# Patient Record
Sex: Male | Born: 1945 | Race: White | Hispanic: No | Marital: Married | Smoking: Former smoker
Health system: Southern US, Community
[De-identification: ages and names within clinical notes are randomized; demographics above are authoritative.]

## PROBLEM LIST (undated history)

## (undated) DIAGNOSIS — Z9889 Other specified postprocedural states: Secondary | ICD-10-CM

## (undated) DIAGNOSIS — M1990 Unspecified osteoarthritis, unspecified site: Secondary | ICD-10-CM

## (undated) DIAGNOSIS — R0600 Dyspnea, unspecified: Secondary | ICD-10-CM

## (undated) DIAGNOSIS — I10 Essential (primary) hypertension: Secondary | ICD-10-CM

## (undated) DIAGNOSIS — E1165 Type 2 diabetes mellitus with hyperglycemia: Secondary | ICD-10-CM

## (undated) DIAGNOSIS — T8859XA Other complications of anesthesia, initial encounter: Secondary | ICD-10-CM

## (undated) DIAGNOSIS — N183 Chronic kidney disease, stage 3 unspecified: Secondary | ICD-10-CM

## (undated) DIAGNOSIS — G43909 Migraine, unspecified, not intractable, without status migrainosus: Secondary | ICD-10-CM

## (undated) DIAGNOSIS — Z951 Presence of aortocoronary bypass graft: Secondary | ICD-10-CM

## (undated) DIAGNOSIS — I509 Heart failure, unspecified: Secondary | ICD-10-CM

## (undated) DIAGNOSIS — I252 Old myocardial infarction: Secondary | ICD-10-CM

## (undated) DIAGNOSIS — IMO0001 Reserved for inherently not codable concepts without codable children: Secondary | ICD-10-CM

## (undated) DIAGNOSIS — Z973 Presence of spectacles and contact lenses: Secondary | ICD-10-CM

## (undated) DIAGNOSIS — E7800 Pure hypercholesterolemia, unspecified: Secondary | ICD-10-CM

## (undated) DIAGNOSIS — I739 Peripheral vascular disease, unspecified: Secondary | ICD-10-CM

## (undated) DIAGNOSIS — D649 Anemia, unspecified: Secondary | ICD-10-CM

## (undated) DIAGNOSIS — J45909 Unspecified asthma, uncomplicated: Secondary | ICD-10-CM

## (undated) DIAGNOSIS — R011 Cardiac murmur, unspecified: Secondary | ICD-10-CM

## (undated) DIAGNOSIS — Z9861 Coronary angioplasty status: Secondary | ICD-10-CM

## (undated) DIAGNOSIS — I2510 Atherosclerotic heart disease of native coronary artery without angina pectoris: Secondary | ICD-10-CM

## (undated) DIAGNOSIS — R112 Nausea with vomiting, unspecified: Secondary | ICD-10-CM

## (undated) DIAGNOSIS — K219 Gastro-esophageal reflux disease without esophagitis: Secondary | ICD-10-CM

## (undated) DIAGNOSIS — T4145XA Adverse effect of unspecified anesthetic, initial encounter: Secondary | ICD-10-CM

## (undated) DIAGNOSIS — E1139 Type 2 diabetes mellitus with other diabetic ophthalmic complication: Secondary | ICD-10-CM

## (undated) DIAGNOSIS — I25810 Atherosclerosis of coronary artery bypass graft(s) without angina pectoris: Secondary | ICD-10-CM

## (undated) HISTORY — PX: TONSILLECTOMY: SUR1361

## (undated) HISTORY — DX: Atherosclerosis of coronary artery bypass graft(s) without angina pectoris: I25.810

## (undated) HISTORY — PX: COLONOSCOPY: SHX174

## (undated) HISTORY — DX: Peripheral vascular disease, unspecified: I73.9

## (undated) HISTORY — PX: EYE SURGERY: SHX253

## (undated) HISTORY — DX: Atherosclerotic heart disease of native coronary artery without angina pectoris: I25.10

## (undated) HISTORY — DX: Type 2 diabetes mellitus with other diabetic ophthalmic complication: E11.39

## (undated) HISTORY — DX: Reserved for inherently not codable concepts without codable children: IMO0001

## (undated) HISTORY — DX: Coronary angioplasty status: Z98.61

## (undated) HISTORY — DX: Type 2 diabetes mellitus with hyperglycemia: E11.65

## (undated) HISTORY — DX: Old myocardial infarction: I25.2

## (undated) HISTORY — PX: RETINAL LASER PROCEDURE: SHX2339

---

## 1999-01-11 ENCOUNTER — Ambulatory Visit (HOSPITAL_COMMUNITY): Admission: RE | Admit: 1999-01-11 | Discharge: 1999-01-11 | Payer: Self-pay | Admitting: Gastroenterology

## 2000-12-22 ENCOUNTER — Encounter: Payer: Self-pay | Admitting: Orthopedic Surgery

## 2000-12-22 ENCOUNTER — Ambulatory Visit (HOSPITAL_COMMUNITY): Admission: RE | Admit: 2000-12-22 | Discharge: 2000-12-22 | Payer: Self-pay | Admitting: Orthopedic Surgery

## 2001-06-14 ENCOUNTER — Ambulatory Visit (HOSPITAL_COMMUNITY): Admission: RE | Admit: 2001-06-14 | Discharge: 2001-06-14 | Payer: Self-pay | Admitting: Cardiology

## 2001-06-14 ENCOUNTER — Encounter: Payer: Self-pay | Admitting: Cardiology

## 2001-07-01 ENCOUNTER — Encounter: Payer: Self-pay | Admitting: Cardiology

## 2001-07-01 ENCOUNTER — Encounter: Admission: RE | Admit: 2001-07-01 | Discharge: 2001-07-01 | Payer: Self-pay | Admitting: Cardiology

## 2001-07-06 ENCOUNTER — Ambulatory Visit (HOSPITAL_COMMUNITY): Admission: RE | Admit: 2001-07-06 | Discharge: 2001-07-06 | Payer: Self-pay | Admitting: Cardiology

## 2002-04-11 ENCOUNTER — Encounter: Admission: RE | Admit: 2002-04-11 | Discharge: 2002-04-11 | Payer: Self-pay | Admitting: Orthopedic Surgery

## 2002-04-11 ENCOUNTER — Encounter: Payer: Self-pay | Admitting: Orthopedic Surgery

## 2002-07-14 ENCOUNTER — Encounter: Payer: Self-pay | Admitting: Family Medicine

## 2002-07-14 ENCOUNTER — Encounter: Admission: RE | Admit: 2002-07-14 | Discharge: 2002-07-14 | Payer: Self-pay | Admitting: Family Medicine

## 2002-12-27 ENCOUNTER — Emergency Department (HOSPITAL_COMMUNITY): Admission: EM | Admit: 2002-12-27 | Discharge: 2002-12-27 | Payer: Self-pay | Admitting: Emergency Medicine

## 2002-12-27 ENCOUNTER — Encounter: Payer: Self-pay | Admitting: Family Medicine

## 2002-12-28 ENCOUNTER — Inpatient Hospital Stay (HOSPITAL_COMMUNITY): Admission: RE | Admit: 2002-12-28 | Discharge: 2002-12-30 | Payer: Self-pay | Admitting: Family Medicine

## 2002-12-29 ENCOUNTER — Encounter: Payer: Self-pay | Admitting: Family Medicine

## 2011-02-22 HISTORY — PX: CORONARY ANGIOPLASTY WITH STENT PLACEMENT: SHX49

## 2011-05-13 ENCOUNTER — Other Ambulatory Visit (HOSPITAL_COMMUNITY): Payer: Self-pay | Admitting: Cardiology

## 2011-05-17 MED ORDER — SODIUM CHLORIDE 0.9 % IJ SOLN: 3.0000 mL | Freq: Two times a day (BID) | INTRAMUSCULAR | Status: DC

## 2011-05-17 MED ORDER — SODIUM CHLORIDE 0.9 % IJ SOLN: 3.0000 mL | INTRAMUSCULAR | Status: DC | PRN

## 2011-05-17 MED ORDER — DIAZEPAM 5 MG PO TABS: 5.0000 mg | ORAL_TABLET | ORAL | Status: DC

## 2011-05-17 MED ORDER — SODIUM CHLORIDE 0.9 % IV SOLN: INTRAVENOUS | Status: DC

## 2011-05-17 MED ORDER — SODIUM CHLORIDE 0.9 % IV SOLN: 250.0000 mL | INTRAVENOUS | Status: DC

## 2011-05-20 ENCOUNTER — Other Ambulatory Visit: Payer: Self-pay | Admitting: Cardiology

## 2011-05-27 ENCOUNTER — Encounter (HOSPITAL_COMMUNITY): Payer: Self-pay | Admitting: Pharmacy Technician

## 2011-06-06 ENCOUNTER — Other Ambulatory Visit: Payer: Self-pay | Admitting: Cardiology

## 2011-06-06 ENCOUNTER — Ambulatory Visit
Admission: RE | Admit: 2011-06-06 | Discharge: 2011-06-06 | Disposition: A | Discharge status: Home / Self Care | Payer: Medicare Other | Source: Ambulatory Visit | Attending: Cardiology | Admitting: Cardiology

## 2011-06-06 DIAGNOSIS — R0602 Shortness of breath: Secondary | ICD-10-CM

## 2011-06-11 ENCOUNTER — Encounter (HOSPITAL_COMMUNITY)
Admission: RE | Disposition: A | Discharge status: Home / Self Care | Payer: Self-pay | Source: Ambulatory Visit | Attending: Cardiology

## 2011-06-11 ENCOUNTER — Encounter (HOSPITAL_COMMUNITY): Payer: Self-pay | Admitting: *Deleted

## 2011-06-11 ENCOUNTER — Ambulatory Visit (HOSPITAL_COMMUNITY): Payer: Medicare Other

## 2011-06-11 ENCOUNTER — Other Ambulatory Visit: Payer: Self-pay

## 2011-06-11 ENCOUNTER — Inpatient Hospital Stay (HOSPITAL_COMMUNITY)
Admission: RE | Admit: 2011-06-11 | Discharge: 2011-06-13 | DRG: 246 | Disposition: A | Discharge status: Home / Self Care | Payer: Medicare Other | Source: Ambulatory Visit | Attending: Cardiology | Admitting: Cardiology

## 2011-06-11 DIAGNOSIS — F411 Generalized anxiety disorder: Secondary | ICD-10-CM | POA: Diagnosis present

## 2011-06-11 DIAGNOSIS — I10 Essential (primary) hypertension: Secondary | ICD-10-CM | POA: Diagnosis not present

## 2011-06-11 DIAGNOSIS — R0609 Other forms of dyspnea: Secondary | ICD-10-CM | POA: Diagnosis present

## 2011-06-11 DIAGNOSIS — Z79899 Other long term (current) drug therapy: Secondary | ICD-10-CM

## 2011-06-11 DIAGNOSIS — I219 Acute myocardial infarction, unspecified: Secondary | ICD-10-CM | POA: Diagnosis not present

## 2011-06-11 DIAGNOSIS — Y849 Medical procedure, unspecified as the cause of abnormal reaction of the patient, or of later complication, without mention of misadventure at the time of the procedure: Secondary | ICD-10-CM | POA: Diagnosis not present

## 2011-06-11 DIAGNOSIS — K219 Gastro-esophageal reflux disease without esophagitis: Secondary | ICD-10-CM | POA: Diagnosis not present

## 2011-06-11 DIAGNOSIS — R0600 Dyspnea, unspecified: Secondary | ICD-10-CM | POA: Diagnosis present

## 2011-06-11 DIAGNOSIS — I2510 Atherosclerotic heart disease of native coronary artery without angina pectoris: Secondary | ICD-10-CM | POA: Diagnosis not present

## 2011-06-11 DIAGNOSIS — E11319 Type 2 diabetes mellitus with unspecified diabetic retinopathy without macular edema: Secondary | ICD-10-CM | POA: Diagnosis present

## 2011-06-11 DIAGNOSIS — I25709 Atherosclerosis of coronary artery bypass graft(s), unspecified, with unspecified angina pectoris: Secondary | ICD-10-CM | POA: Diagnosis not present

## 2011-06-11 DIAGNOSIS — F19921 Other psychoactive substance use, unspecified with intoxication with delirium: Secondary | ICD-10-CM | POA: Diagnosis not present

## 2011-06-11 DIAGNOSIS — E1139 Type 2 diabetes mellitus with other diabetic ophthalmic complication: Secondary | ICD-10-CM | POA: Diagnosis present

## 2011-06-11 DIAGNOSIS — I129 Hypertensive chronic kidney disease with stage 1 through stage 4 chronic kidney disease, or unspecified chronic kidney disease: Secondary | ICD-10-CM | POA: Diagnosis present

## 2011-06-11 DIAGNOSIS — I214 Non-ST elevation (NSTEMI) myocardial infarction: Secondary | ICD-10-CM | POA: Diagnosis not present

## 2011-06-11 DIAGNOSIS — Z87891 Personal history of nicotine dependence: Secondary | ICD-10-CM

## 2011-06-11 DIAGNOSIS — E119 Type 2 diabetes mellitus without complications: Secondary | ICD-10-CM | POA: Diagnosis present

## 2011-06-11 DIAGNOSIS — I519 Heart disease, unspecified: Secondary | ICD-10-CM | POA: Diagnosis not present

## 2011-06-11 DIAGNOSIS — Z7982 Long term (current) use of aspirin: Secondary | ICD-10-CM

## 2011-06-11 DIAGNOSIS — N182 Chronic kidney disease, stage 2 (mild): Secondary | ICD-10-CM | POA: Diagnosis present

## 2011-06-11 DIAGNOSIS — E785 Hyperlipidemia, unspecified: Secondary | ICD-10-CM | POA: Diagnosis present

## 2011-06-11 DIAGNOSIS — Z794 Long term (current) use of insulin: Secondary | ICD-10-CM

## 2011-06-11 DIAGNOSIS — I639 Cerebral infarction, unspecified: Secondary | ICD-10-CM

## 2011-06-11 DIAGNOSIS — Y9210 Unspecified residential institution as the place of occurrence of the external cause: Secondary | ICD-10-CM | POA: Diagnosis not present

## 2011-06-11 HISTORY — PX: PERCUTANEOUS CORONARY STENT INTERVENTION (PCI-S): SHX5485

## 2011-06-11 HISTORY — DX: Gastro-esophageal reflux disease without esophagitis: K21.9

## 2011-06-11 HISTORY — DX: Essential (primary) hypertension: I10

## 2011-06-11 HISTORY — PX: LEFT HEART CATHETERIZATION WITH CORONARY ANGIOGRAM: SHX5451

## 2011-06-11 LAB — GLUCOSE, CAPILLARY
Glucose-Capillary: 101 mg/dL — ABNORMAL HIGH (ref 70–99)
Glucose-Capillary: 100 mg/dL — ABNORMAL HIGH (ref 70–99)
Glucose-Capillary: 84 mg/dL (ref 70–99)

## 2011-06-11 LAB — POCT ACTIVATED CLOTTING TIME
Activated Clotting Time: 342 seconds
Activated Clotting Time: 292 seconds
Activated Clotting Time: 215 seconds

## 2011-06-11 LAB — CBC
HCT: 33.5 % — ABNORMAL LOW (ref 39.0–52.0)
MCHC: 34 g/dL (ref 30.0–36.0)
MCV: 82.9 fL (ref 78.0–100.0)
Platelets: 199 10*3/uL (ref 150–400)
RDW: 12.9 % (ref 11.5–15.5)
WBC: 9.8 10*3/uL (ref 4.0–10.5)

## 2011-06-11 SURGERY — LEFT HEART CATHETERIZATION WITH CORONARY ANGIOGRAM
Anesthesia: LOCAL

## 2011-06-11 MED ORDER — DEXTROSE 50 % IV SOLN
INTRAVENOUS | Status: AC
  Administered 2011-06-11: 25 mL via INTRAVENOUS
  Filled 2011-06-11: qty 50

## 2011-06-11 MED ORDER — ACETAMINOPHEN 325 MG PO TABS: 650.0000 mg | ORAL_TABLET | ORAL | Status: DC | PRN

## 2011-06-11 MED ORDER — MORPHINE SULFATE 10 MG/ML IJ SOLN
INTRAMUSCULAR | Status: AC
  Filled 2011-06-11: qty 1

## 2011-06-11 MED ORDER — DIAZEPAM 2 MG PO TABS: 2.0000 mg | ORAL_TABLET | Freq: Once | ORAL | Status: DC

## 2011-06-11 MED ORDER — SODIUM CHLORIDE 0.9 % IV SOLN: 1.0000 mL/kg/h | INTRAVENOUS | Status: AC

## 2011-06-11 MED ORDER — CLOPIDOGREL BISULFATE 300 MG PO TABS
600.0000 mg | ORAL_TABLET | Freq: Once | ORAL | Status: AC
  Administered 2011-06-11: 600 mg via ORAL

## 2011-06-11 MED ORDER — BIVALIRUDIN 250 MG IV SOLR
INTRAVENOUS | Status: AC
  Filled 2011-06-11: qty 250

## 2011-06-11 MED ORDER — CLOPIDOGREL BISULFATE 75 MG PO TABS
75.0000 mg | ORAL_TABLET | Freq: Every day | ORAL | Status: DC
  Administered 2011-06-12: 75 mg via ORAL
  Filled 2011-06-11: qty 1

## 2011-06-11 MED ORDER — ONDANSETRON HCL 4 MG/2ML IJ SOLN
4.0000 mg | Freq: Four times a day (QID) | INTRAMUSCULAR | Status: DC | PRN
  Administered 2011-06-11: 4 mg via INTRAVENOUS
  Filled 2011-06-11: qty 2

## 2011-06-11 MED ORDER — ASPIRIN EC 81 MG PO TBEC
81.0000 mg | DELAYED_RELEASE_TABLET | Freq: Every day | ORAL | Status: DC
  Administered 2011-06-11 – 2011-06-12 (×2): 81 mg via ORAL
  Filled 2011-06-11 (×2): qty 1

## 2011-06-11 MED ORDER — SODIUM CHLORIDE 0.9 % IJ SOLN: 3.0000 mL | INTRAMUSCULAR | Status: DC | PRN

## 2011-06-11 MED ORDER — OXYCODONE-ACETAMINOPHEN 5-325 MG PO TABS: 1.0000 | ORAL_TABLET | ORAL | Status: DC | PRN

## 2011-06-11 MED ORDER — HEPARIN (PORCINE) IN NACL 2-0.9 UNIT/ML-% IJ SOLN
INTRAMUSCULAR | Status: AC
  Filled 2011-06-11: qty 2000

## 2011-06-11 MED ORDER — DEXTROSE 50 % IV SOLN
25.0000 mL | Freq: Once | INTRAVENOUS | Status: AC
  Administered 2011-06-11: 25 mL via INTRAVENOUS

## 2011-06-11 MED ORDER — SODIUM CHLORIDE 0.9 % IV SOLN
250.0000 mL | INTRAVENOUS | Status: DC | PRN
Start: 2011-06-11 — End: 2011-06-13

## 2011-06-11 MED ORDER — EPTIFIBATIDE 75 MG/100ML IV SOLN
2.0000 ug/kg/min | INTRAVENOUS | Status: AC
  Administered 2011-06-11 – 2011-06-12 (×2): 2 ug/kg/min via INTRAVENOUS
  Filled 2011-06-11 (×2): qty 100

## 2011-06-11 MED ORDER — ONDANSETRON HCL 4 MG/2ML IJ SOLN: 4.0000 mg | Freq: Four times a day (QID) | INTRAMUSCULAR | Status: DC | PRN

## 2011-06-11 MED ORDER — NITROGLYCERIN 0.2 MG/ML ON CALL CATH LAB
INTRAVENOUS | Status: AC
  Filled 2011-06-11: qty 1

## 2011-06-11 MED ORDER — DIAZEPAM 2 MG PO TABS
2.0000 mg | ORAL_TABLET | Freq: Four times a day (QID) | ORAL | Status: DC | PRN
  Filled 2011-06-11 (×2): qty 1

## 2011-06-11 MED ORDER — MIDAZOLAM HCL 2 MG/2ML IJ SOLN
INTRAMUSCULAR | Status: AC
  Filled 2011-06-11: qty 2

## 2011-06-11 MED ORDER — LIDOCAINE HCL (PF) 1 % IJ SOLN
INTRAMUSCULAR | Status: AC
  Filled 2011-06-11: qty 30

## 2011-06-11 MED ORDER — NALOXONE HCL 0.4 MG/ML IJ SOLN
INTRAMUSCULAR | Status: AC
  Filled 2011-06-11: qty 1

## 2011-06-11 MED ORDER — FENTANYL CITRATE 0.05 MG/ML IJ SOLN
INTRAMUSCULAR | Status: AC
  Filled 2011-06-11: qty 2

## 2011-06-11 MED ORDER — CLOPIDOGREL BISULFATE 300 MG PO TABS
ORAL_TABLET | ORAL | Status: AC
  Filled 2011-06-11: qty 2

## 2011-06-11 MED ORDER — EPTIFIBATIDE 75 MG/100ML IV SOLN
INTRAVENOUS | Status: AC
  Filled 2011-06-11: qty 100

## 2011-06-11 MED ORDER — INSULIN ASPART 100 UNIT/ML ~~LOC~~ SOLN
0.0000 [IU] | SUBCUTANEOUS | Status: DC
  Filled 2011-06-11: qty 3

## 2011-06-11 MED ORDER — DIAZEPAM 5 MG PO TABS
5.0000 mg | ORAL_TABLET | ORAL | Status: AC
  Administered 2011-06-11: 5 mg via ORAL

## 2011-06-11 MED ORDER — DIAZEPAM 5 MG PO TABS
ORAL_TABLET | ORAL | Status: AC
  Administered 2011-06-11: 5 mg via ORAL
  Filled 2011-06-11: qty 1

## 2011-06-11 MED ORDER — HYDRALAZINE HCL 20 MG/ML IJ SOLN
20.0000 mg | Freq: Four times a day (QID) | INTRAMUSCULAR | Status: DC | PRN
  Administered 2011-06-11 (×2): 20 mg via INTRAVENOUS
  Filled 2011-06-11 (×2): qty 1

## 2011-06-11 MED ORDER — SODIUM CHLORIDE 0.9 % IV SOLN
INTRAVENOUS | Status: DC
  Administered 2011-06-11: 1000 mL via INTRAVENOUS

## 2011-06-11 MED ORDER — SODIUM CHLORIDE 0.9 % IJ SOLN
3.0000 mL | Freq: Two times a day (BID) | INTRAMUSCULAR | Status: DC
  Administered 2011-06-12 – 2011-06-13 (×3): 3 mL via INTRAVENOUS

## 2011-06-11 MED ORDER — ATROPINE SULFATE 1 MG/ML IJ SOLN
INTRAMUSCULAR | Status: AC
  Filled 2011-06-11: qty 1

## 2011-06-11 MED ORDER — HYDRALAZINE HCL 20 MG/ML IJ SOLN: 20.0000 mg | Freq: Once | INTRAMUSCULAR | Status: DC

## 2011-06-11 MED ORDER — CLONIDINE HCL 0.1 MG PO TABS
0.1000 mg | ORAL_TABLET | Freq: Once | ORAL | Status: DC
  Filled 2011-06-11 (×2): qty 1

## 2011-06-11 NOTE — Consult Note (Signed)
[facility]KE CONSULT [facility]       Chief Complaint: confusion after Catheterization   HPI:    Lance Collier is an 65 y.o. male who has history of coronary artery disease, diabetes and cardiac catheterization in 2003.  His last cardiac stress test was negative for ischemia showing an ejection fraction 67%. He was brought to [facility] hospital for cardiac catheterization. After cardiac cath, patient was noted to have confusion.  Neurology was asked to see the patient for further evaluation.  At time of consult patient showed no confusion, was able to name presidents back to [place], he was AOX3 and followed all commands.    LSN: Just prior to cardiac cath tPA Given: No: rapid resolution    PMHX: DM and CAD (cardiac catheterization 2003)  No PSHx  No family history on file.  Social History: Patient quit smoking 14 years ago. He is an occasional alcoholic drink.   Allergies:  Allergies  Allergen Reactions  . Ibuprofen Hives    Medications:    Prior to Admission:  Prescriptions prior to admission  Medication Sig Dispense Refill  . Aspirin Buf,CaCarb-MgCarb-MgO, (BUFFERIN EXTRA STRENGTH) 500 MG TABS Take 1 tablet by mouth every 6 (six) hours as needed. For pain       . aspirin EC 81 MG tablet Take 81 mg by mouth daily.        . insulin aspart (NOVOLOG) 100 UNIT/ML injection Inject 10-15 Units into the skin 3 (three) times daily before meals. 10 units with breakfast, 15 units with lunch, 15 units with dinner       . insulin glargine (LANTUS) 100 UNIT/ML injection Inject 40-50 Units into the skin 2 (two) times daily. 40 units in am, 50 units in pm       . losartan-hydrochlorothiazide (HYZAAR) 100-12.5 MG per tablet Take 1 tablet by mouth daily.        Marland Kitchen omega-3 acid ethyl esters (LOVAZA) 1 G capsule Take by mouth 2 (two) times daily.        . pravastatin (PRAVACHOL) 40 MG tablet Take 40 mg by mouth daily.        Marland Kitchen DISCONTD: nebivolol (BYSTOLIC) 5 MG tablet  Take 5 mg by mouth daily.         Scheduled:   . bivalirudin      . bivalirudin      . bivalirudin      . clopidogrel  600 mg Oral Once  . clopidogrel  75 mg Oral Q breakfast  . diazepam  5 mg Oral On Call  . eptifibatide      . fentaNYL      . heparin      . insulin aspart  0-9 Units Subcutaneous Q4H  . lidocaine      . midazolam      . midazolam      . morphine      . nitroGLYCERIN      . sodium chloride  3 mL Intravenous Q12H    ROS: History obtained from the patient  General ROS: negative for - chills, fatigue, fever, night sweats, weight gain or weight loss Psychological ROS: negative for - behavioral disorder, hallucinations, memory difficulties, mood swings or suicidal ideation Ophthalmic ROS: negative for - blurry vision, double vision, eye pain or loss of vision ENT ROS: negative for - epistaxis, nasal discharge, oral lesions, sore throat, tinnitus or vertigo Allergy and Immunology ROS:  negative for - hives or itchy/watery eyes Hematological and Lymphatic ROS: negative for - bleeding problems, bruising or swollen lymph nodes Endocrine ROS: negative for - galactorrhea, hair pattern changes, polydipsia/polyuria or temperature intolerance Respiratory ROS: negative for - cough, hemoptysis, shortness of breath or wheezing Cardiovascular ROS: negative for - chest pain, dyspnea on exertion, edema or irregular heartbeat Gastrointestinal ROS: negative for - abdominal pain, diarrhea, hematemesis, nausea/vomiting or stool incontinence Genito-Urinary ROS: negative for - dysuria, hematuria, incontinence or urinary frequency/urgency Musculoskeletal ROS: negative for - joint swelling or muscular weakness Neurological ROS: as noted in HPI Dermatological ROS: negative for rash and skin lesion changes   Physical Examination: Blood pressure 168/65, pulse 47, temperature 97.4 F (36.3 C), temperature source Oral, resp. rate 18, height 5\' 9"  (1.753 m), weight 92.987 kg (205 lb), SpO2  95.00%.  Neurologic Examination:   Mental Status: Alert, oriented, thought content appropriate.  Speech fluent without evidence of aphasia. Able to follow 3 step commands without difficulty. Cranial Nerves: II-Visual fields grossly intact. III/IV/VI-Extraocular movements intact.  Pupils reactive bilaterally. V/VII-Smile symmetric VIII-grossly intact IX/X-normal gag XI-bilateral shoulder shrug XII-midline tongue extension Motor: 5/5 bilaterally with normal tone and bulk Sensory: Pinprick and light touch intact throughout, bilaterally Deep Tendon Reflexes: 2+ and symmetric throughout Plantars: Up going on Right and Down on left Cerebellar: Normal finger-to-nose, and normal heel-to-shin test on left (unable to to on right due to cath site.       No results found for this basename: cbc, bmp, coags, chol, tri, ldl, hga1c, tsh, rpr   Ct Head Wo Contrast  06/11/2011  *RADIOLOGY REPORT*  Clinical Data: Rule out stroke.  Stent placed today.  Confusion  CT HEAD WITHOUT CONTRAST  Technique:  Contiguous axial images were obtained from the base of the skull through the vertex without contrast.  Comparison: None.  Findings: The patient received intravenous contrast from the prior procedure.  There is contrast in the vessels on the study.  No additional contrast was given for the CT scan.  Mild frontal atrophy.  Ventricle size is normal.  Negative for acute infarct.  Negative for hemorrhage or mass.  No enhancing lesions are identified.  Calvarium is intact.  Atherosclerotic calcification in the cavernous carotid bilaterally.  IMPRESSION: No acute abnormality.  Original Report Authenticated By: Truett Perna, M.D.    Assessment:    65 y.o. male with confusion S/P cardiac catheterization.  Patient has fully resolved at this time.  It is likely that the mental status change was secondary to medications.  Can not rule out a possible shower of emboli that could have caused the presenting symptoms as  well.  The differential was discussed with the patient along with the option of having an MRI of the brain to further investigate episode of altered mental status.  Patient refuses MRI at this time.     Stroke Risk Factors - diabetes mellitus  Plan: 1. No further neurologic intervention is recommended at this time.  If further questions arise, please call or page at that time.  Thank you for allowing neurology to participate in the care of this patient.  Alexis Goodell, MD [facility] [phone] 06/11/2011  6:46 PM

## 2011-06-11 NOTE — H&P (Signed)
Lance Collier is an 65 y.o. male.   Chief Complaint: DOE HPI:  Patient is a 64 year old male with a history of coronary artery disease a cardiac catheterization in 2003 which showed 50% mid RCA narrowing and normal LV systolic function. Cardiac stress test avert thousand 1 which was negative for ischemia showing an ejection fraction 67%. History also includes diabetes which is not adequately controlled. Last hemoglobin A1c was 8.1.  The patient has been having progressive dyspnea on exertion since October. He is to stop work walking on the treadmill for that reason. 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 He presents for left heart catheterization. He denies any chest pain abdominal pain orthopnea PND claudication .   Past medical history Coronary disease, diabetes mellitus, hyperlipidemia  No past surgical history on file.  No family history on file. Social History:  Patient quit smoking 14 years ago. He is an occasional alcoholic drink.  Allergies:  Allergies  Allergen Reactions  . Ibuprofen Hives   Medications Aspirin 81 mg, Lantus 45 units twice a day, NovoLog 10 units in the morning 15 at noon 13 in the evening, Prilosec 20 mg when necessary, Omelia Blackwater is a 2 tablets twice a day, Weisbach 5 mg, with certain/HCTZ [id number] once daily, pravastatin 40 mg  Medications Prior to Admission  Medication Dose Route Frequency Provider Last Rate Last Dose  . 0.9 %  sodium chloride infusion   Intravenous Continuous Fulton Reek, MD 75 mL/hr at 06/11/11 0821 1,000 mL at 06/11/11 0821  . acetaminophen (TYLENOL) tablet 650 mg  650 mg Oral Q4H PRN Fulton Reek, MD      . diazepam (VALIUM) tablet 5 mg  5 mg Oral On Call Fulton Reek, MD   5 mg at 06/11/11 0806  . insulin aspart (novoLOG) injection 0-9 Units  0-9 Units Subcutaneous Q4H Fulton Reek, MD      . ondansetron [facility]) injection 4 mg  4 mg Intravenous Q6H PRN Fulton Reek, MD      . sodium  chloride 0.9 % injection 3 mL  3 mL Intravenous PRN Fulton Reek, MD       No current outpatient prescriptions on file as of 06/11/2011.    Results for orders placed during the hospital encounter of 06/11/11 (from the past 48 hour(s))  GLUCOSE, CAPILLARY     Status: Abnormal   Collection Time   06/11/11  7:53 AM      Component Value Range Comment   Glucose-Capillary 101 (*) 70 - 99 (mg/dL)    No results found.  Review of Systems  Constitutional: Negative for diaphoresis.  HENT: Negative for congestion.   Eyes: Negative.   Respiratory: Positive for shortness of breath. Negative for cough.        Dyspnea on exertion  Cardiovascular: Positive for leg swelling. Negative for chest pain, orthopnea, claudication and PND.  Gastrointestinal: Negative for nausea, vomiting, abdominal pain, diarrhea, constipation, blood in stool and melena.  Genitourinary: Negative for dysuria and hematuria.  Musculoskeletal: Negative.   Neurological: Negative for weakness.  All other systems reviewed and are negative.    Blood pressure 168/65, pulse 48, temperature 97.4 F (36.3 C), temperature source Oral, resp. rate 18, height 5\' 9"  (1.753 m), weight 92.987 kg (205 lb), SpO2 99.00%. Physical Exam  Constitutional: He is oriented to person, place, and time. He appears well-nourished. No distress.  HENT:  Head: Normocephalic and atraumatic.  Eyes: EOM are normal. Pupils  are equal, round, and reactive to light. No scleral icterus.  Neck: No JVD present.  Cardiovascular: Normal rate, regular rhythm, S1 normal and S2 normal.   Murmur heard.  Systolic murmur is present with a grade of 1/6  Pulses:      Carotid pulses are on the left side with bruit.      Radial pulses are 2+ on the right side, and 2+ on the left side.       Femoral pulses are on the left side with bruit.      Dorsalis pedis pulses are 2+ on the right side, and 2+ on the left side.       1+ left ankle edema.  Respiratory: Effort  normal and breath sounds normal. He has no wheezes. He has no rales.  GI: Soft. Bowel sounds are normal. There is no tenderness.  Lymphadenopathy:    He has no cervical adenopathy.  Neurological: He is alert and oriented to person, place, and time.  Skin: Skin is warm and dry.     Assessment/Plan 1. Dyspnea on exertion 2. History coronary artery disease 3. Diabetes mellitus 4 hyperlipidemia  Plan: Patient presents for left heart catheterization and possible PCI.  HAGER,BRYAN W 06/11/2011, 8:35 AM  Dr. Rex Kras saw and examined the patient on 12/19 AM prior to his cardiac catheterization.  I assisted him with the procedure.  I have reviewed the chart, and agree with this brief note by Mr. Samara Snide, [place].   Dr. Rex Kras was unaware of the note being done; therefore, I am signing this H&P in his stead.  Leonie Man, M.D., M.S. [facility] [address] [place], [place]  [address]  [phone]  06/12/2011 9:53 AM

## 2011-06-11 NOTE — Progress Notes (Addendum)
ANTICOAGULATION CONSULT NOTE - Initial Consult  Pharmacy Consult for Integrilin Indication: 18 hrs post-cath  Allergies  Allergen Reactions  . Ibuprofen Hives    Patient Measurements: Height: 5\' 9"  (175.3 cm) (Simultaneous filing. User may not have seen previous data.) Weight: 205 lb (92.987 kg) (Simultaneous filing. User may not have seen previous data.) IBW/kg (Calculated) : 70.7  Adjusted Body Weight: 93  Vital Signs: Temp: 97.4 F (36.3 C) (12/19 0749) Temp src: Oral (12/19 0749) BP: 168/65 mmHg (12/19 0749) Pulse Rate: 49  (12/19 0845)  Labs:  From [facility] 06/06/11; WBC 5; Hgb 11.8; Hct 34.7; Pltc 217  No results found for this basename: HGB:2,HCT:3,PLT:3,APTT:3,LABPROT:3,INR:3,HEPARINUNFRC:3,CREATININE:3,CKTOTAL:3,CKMB:3,TROPONINI:3 in the last 72 hours CrCl is unknown because no creatinine reading has been taken.  Medical History: No past medical history on file.  Medications:  Scheduled:    . bivalirudin      . bivalirudin      . bivalirudin      . clopidogrel  600 mg Oral Once  . diazepam  5 mg Oral On Call  . eptifibatide      . fentaNYL      . heparin      . insulin aspart  0-9 Units Subcutaneous Q4H  . lidocaine      . midazolam      . midazolam      . morphine      . nitroGLYCERIN        Assessment: 65 yo male s/p cath  Lab to continue Integrilin for 18 hrs.  Goal of Therapy:  N/A   Plan:  Continue Integrilin at 2 mcg/kg/hr.  Will check CBC in 8 hrs.  D/C Integrilin  Korina Tretter C 06/11/2011,3:56 PM

## 2011-06-11 NOTE — Progress Notes (Signed)
ANTICOAGULATION CONSULT NOTE - Initial Consult  Pharmacy Consult for Integrilin Indication: 18 hrs post-cath  Allergies  Allergen Reactions  . Ibuprofen Hives    Patient Measurements: Height: 5\' 9"  (175.3 cm) (Simultaneous filing. User may not have seen previous data.) Weight: 205 lb (92.987 kg) (Simultaneous filing. User may not have seen previous data.) IBW/kg (Calculated) : 70.7  Adjusted Body Weight: 93  Vital Signs: Temp: 98.8 F (37.1 C) (12/19 2018) Temp src: Oral (12/19 2018) BP: 140/45 mmHg (12/19 1900) Pulse Rate: 84  (12/19 1900)  Labs:  [facility] 06/11/11 2002  HGB 11.4*  HCT 33.5*  PLT 199  APTT --  LABPROT --  INR --  HEPARINUNFRC --  CREATININE --  CKTOTAL --  CKMB --  TROPONINI --   CrCl is unknown because no creatinine reading has been taken.  Medical History: No past medical history on file.  Medications:  Scheduled:     . bivalirudin      . bivalirudin      . bivalirudin      . cloNIDine  0.1 mg Oral Once  . clopidogrel  600 mg Oral Once  . clopidogrel  75 mg Oral Q breakfast  . dextrose  25 mL Intravenous Once  . diazepam  2 mg Oral Once  . diazepam  5 mg Oral On Call  . eptifibatide      . fentaNYL      . heparin      . hydrALAZINE  20 mg Intravenous Once  . insulin aspart  0-9 Units Subcutaneous Q4H  . lidocaine      . midazolam      . midazolam      . morphine      . nitroGLYCERIN      . sodium chloride  3 mL Intravenous Q12H    Assessment: 65 yo male s/p cath  Lab to continue Integrilin for 18 hrs.  CBC stable since integrilin started (H/H 11.8/34.7; Pltc 217 at [facility] 06/05/12)  Goal of Therapy:  N/A   Plan:  Continue Integrilin at 2 mcg/kg/hr.   D/C Integrilin after 18 hrs, which will be tomorrow at 0600.  Delpha Perko C 06/11/2011,8:48 PM

## 2011-06-11 NOTE — Progress Notes (Signed)
Right Femoral Arterial Sheath Pull:  Sheath pulled per protocol.  Direct manual pressure was held x 30 minutes. Groin was a level 1 due to bruising at insertions site. No hematoma was present. A pressure dressing was applied to the site. Pt was educated on signs and symptoms of bleeding and verbalized understanding. Wife was at the bedside. Pt was left with the call bell within reach and instructed to call RN as needed.

## 2011-06-11 NOTE — H&P (Signed)
Lance Collier is an 65 y.o. male.   Chief Complaint: This 65 year old male has long-standing diabetes. He had a cath several years earlier that showed mild to moderate disease but no high-grade stenoses. The right 3 months he's had worsening dyspnea on exertion. He can walk about 150 yards and then becomes breathless. If he carries anything  he can hardly walk up a flight. He has had no anginal type complaints. He has an occasional cough that is usually nonproductive he said no significant lower extremity edema and he has no awareness of any arrhythmias.  HPI: See above  No past medical history on file.  No past surgical history on file.  No family history on file. Social History:  does not have a smoking history on file. He does not have any smokeless tobacco history on file. His alcohol and drug histories not on file.  Allergies:  Allergies  Allergen Reactions  . Ibuprofen Hives    Medications Prior to Admission  Medication Dose Route Frequency Provider Last Rate Last Dose  . 0.9 %  sodium chloride infusion   Intravenous Continuous Fulton Reek, MD      . acetaminophen (TYLENOL) tablet 650 mg  650 mg Oral Q4H PRN Fulton Reek, MD      . diazepam (VALIUM) tablet 5 mg  5 mg Oral On Call Fulton Reek, MD   5 mg at 06/11/11 0806  . insulin aspart (novoLOG) injection 0-9 Units  0-9 Units Subcutaneous Q4H Fulton Reek, MD      . ondansetron [facility]) injection 4 mg  4 mg Intravenous Q6H PRN Fulton Reek, MD      . sodium chloride 0.9 % injection 3 mL  3 mL Intravenous PRN Fulton Reek, MD       No current outpatient prescriptions on file as of 06/11/2011.    Results for orders placed during the hospital encounter of 06/11/11 (from the past 48 hour(s))  GLUCOSE, CAPILLARY     Status: Abnormal   Collection Time   06/11/11  7:53 AM      Component Value Range Comment   Glucose-Capillary 101 (*) 70 - 99 (mg/dL)    No results found.  Review of Systems    Constitutional: Negative.   HENT: Negative.   Respiratory: Positive for cough and shortness of breath. Negative for sputum production and wheezing.   Cardiovascular: Positive for PND. Negative for chest pain, palpitations, orthopnea and claudication.  Skin: Negative.   Neurological: Negative.   Endo/Heme/Allergies: Negative.   Psychiatric/Behavioral: Negative.   All other systems reviewed and are negative.    Blood pressure 168/65, pulse 48, temperature 97.4 F (36.3 C), temperature source Oral, resp. rate 18, height 5\' 9"  (1.753 m), weight 92.987 kg (205 lb), SpO2 99.00%. Physical Exam   Assessment/Plan Marked dyspnea on exertion suspect an anginal equivalent potentially in view of his prior cath and his diabetes.  Diabetes mellitus   Hypertriglyceridemia  After long discussion with the patient the decision was made to bring him in for an outpatient cardiac catheterization. I discussed in detail the risk of stroke heart attack death and bleeding at the cath site. He understands and accepts these risks.    LITTLE, ALFRED B 06/11/2011, 8:20 AM

## 2011-06-11 NOTE — Progress Notes (Signed)
Pt's BP 180/75. Dr. Rex Kras called, order for PRN hydralazine IV obtained. Genella Mech

## 2011-06-11 NOTE — Op Note (Signed)
This 65 year old male was brought in for an outpatient cardiac catheterization because of worsening dyspnea on exertion this is been getting gradually worse over the last 6 months with progressively worse over the last several months. He had a cardiac catheterization 2003 that showed 50% mid LAD narrowing a nuclear stress test  February of 2000 was negative for ischemia.  Other significant medical problems include diabetes mellitus with diabetic retinopathy hypertension.  After obtaining informed consent the patient was prepped and draped in the usual sterile fashion exposing the right groin find local anesthetic with 1% Xylocaine a 5 French introducer sheath was placed in the right femoral. Left and right coronary arteriography ventriculography in the RAO projection and a complex percutaneous intervention to the RCA was performed.  Total contrast was 230 cc diagnostict equipment 5 French Judkins configuration catheters.   The patient complained of back pain while lying on the table he was given multiple doses of sent meal and percent and at the end of the procedure received 2 mg of IV morphine. Slightly after the morphine he became confused not oriented to place or situation but had equal grip is EOMs. intact moved all 4 extremities. Because of this neurology was called and the patient was taken down for CT brain scan without contrast to make sure he did not have an intercerebral bleed. The patient. is still in radiology at the time of this dictation.  Results hemodynamic monitoring: Central aortic pressure 151/56  left ventricular pressure 159/5. Left ventricular end-diastolic pressure was 15.  Ventriculography in the RAO projection revealed normal LV systolic function ejection fraction greater than 55%.  On fluoroscopy there was dense calcification in the left main and proximal LAD and in the proximal right coronary artery.  #1. The ongoing circumflex in the AV groove is small. There was a large  first OM vessel that bifurcated and was free of disease. The second OM vessel was smaller in diameter but free of disease.  #2 the LAD extended down and around the heart and cause the apex. Minor irregularities in the proximal LAD was noted these were less than 30% and severe the first diagonal had ostial 20% narrowing. It was moderate disease in the septal perforator that is not amenable to any type of intervention.  #3 the right coronary artery was tortuous in the proximal segment it was subtotaled in its midsegment distal to the tortuosity the distal right and the PDA and the posterior lateral vessel was free of disease. In the tortuous proximal segment of the right coronary artery with an area 60% irregularity.    Percutaneous intervention to the RCA was undertaken because of the above anatomy. The 5 [place] system was upgraded to a 6 [place] system. JR  3.5 with side hole guide catheter was used . A short luge wire was placed down the right coronary artery there was clear difficulty navigating the proximal tortuosity. A Cordis 2.0 x 10. Predilatation balloon was used 2 inflations 10 atmospheres for 25 seconds and 12 atmospheres for 35 seconds. This resulted in a focal dissection at the tortuosity segment of 60% narrowing.  Cheriton Treck 2.5 x 20 balloon was used for a series of 2 inflations both 12 atmospheres for 40 seconds . Attempts at passing a Promus 2.75 x by 20 stent was unsuccessful. The stent would not pass through the tortuosity. This was tried multiple times. Dr. Burt Knack and scrubbed in and with a guideline we were finally able to place a stent in such a manner  that it covered the distal area. It was deployed at 14 atmospheres for 25 seconds.  The proximal segment however still needed to be stented at 2.75 x 12 Promus stent would not pass proximally because of this we tried to use a glide liner again but this was initially unsuccessful it appeared that the stent was abutting the stent struts from  the previous stent. We then post dilated the original Promus stent using a 3.0 x 8 mm [place] track balloon a series of 3 inflations throughout the stent resulted in  excellent expansion of the stent at this point we used a glide liner again but could not get the stent to pass the guidewire into the glide liner because of this the the entire system with the exception of the guide catheter and the guidewire was removed here there appeared to be thrombus on the stent and on the distal portion of the guidewire. An ACT was checked and was still 240.  On your quit but was utilization ready we ultimately had to exchange our luge wire because it became bent from so much use. There  As a new luge wire was placed down the RCA once in the distal portion we again used a glide liner and a 2.75 Promus stent. We were able to overlap the stent and the stents were deployed with 2 inflations 12x40 and 10x30. Attempted post dilation of the new stent were unsuccessful we could not get the balloon to pass into the new stent.  The dilatation system was then removed and angiography of the now reconstructed right cardia artery resulted in an area of haziness the midportion of the most distal stent . After close examination of several different positions the consensus waws that it was under expanded and was not a stent.strut fracture  With this in mind we again went back this time using an AL-1 guide catheter a grand slam wire and a Kugel R. we were able to pass a 3.0 x 8 mm [place]  Quantum post dilatation balloon 3 additional inflations in the area of the hypolucent segment. Despite this is still appeared mildly hypolucent in a 3.25 x 8 mm post dilatation balloon was then used one inflation 20 atmospheres for 41 seconds.  Following this hypolucent area looked dramatically better there was brisk TIMI 3 flow the distal portion of the PDA appeared to be occluded. It was quite distal and the patient was asymptomatic.  He had been on  Angiomax throughout the entire procedure he had act greater than 200. When the thrombus was noted on the glide liner Integrilin was not was added to his Angiomax.  The patient was transferred to the holding area dynamically stable. He was bradycardic prior to and during the entire procedure because of this I stopped his Bystolic.Marland Kitchen  He will be maintained on Integrilin for 18 hours following his procedure he was given 600 mg of oral Plavix. The CT of his head was negative for intercerebral bleed or acute abnormality. His confusion has actually improved.  We will need to monitor his renal functions keep him hydrated and make sure his blood sugar and blood pressure remained stable.  He may be ready for discharge in 48 hours. I will check CK-MBs and troponins in the morning his EKG post procedure showed sinus bradycardia with a rate of 42 with no ST-T wave abnormalities.  The procedure started at 9:10 AM and finished at 1:19 PM. Total fluoroscopy time was 90 minutes.  This is an extremely  complicated and complex intervention but he was able to be managed percutaneously without having to go to the operating room for bypass surgery.     #2

## 2011-06-12 DIAGNOSIS — I219 Acute myocardial infarction, unspecified: Secondary | ICD-10-CM | POA: Diagnosis not present

## 2011-06-12 DIAGNOSIS — R0600 Dyspnea, unspecified: Secondary | ICD-10-CM | POA: Diagnosis present

## 2011-06-12 DIAGNOSIS — E785 Hyperlipidemia, unspecified: Secondary | ICD-10-CM | POA: Diagnosis present

## 2011-06-12 DIAGNOSIS — R0609 Other forms of dyspnea: Secondary | ICD-10-CM | POA: Diagnosis present

## 2011-06-12 DIAGNOSIS — I10 Essential (primary) hypertension: Secondary | ICD-10-CM | POA: Diagnosis present

## 2011-06-12 DIAGNOSIS — I25709 Atherosclerosis of coronary artery bypass graft(s), unspecified, with unspecified angina pectoris: Secondary | ICD-10-CM | POA: Diagnosis not present

## 2011-06-12 DIAGNOSIS — I2510 Atherosclerotic heart disease of native coronary artery without angina pectoris: Secondary | ICD-10-CM | POA: Diagnosis not present

## 2011-06-12 DIAGNOSIS — E119 Type 2 diabetes mellitus without complications: Secondary | ICD-10-CM | POA: Diagnosis present

## 2011-06-12 DIAGNOSIS — I252 Old myocardial infarction: Secondary | ICD-10-CM

## 2011-06-12 DIAGNOSIS — N182 Chronic kidney disease, stage 2 (mild): Secondary | ICD-10-CM | POA: Diagnosis present

## 2011-06-12 DIAGNOSIS — I214 Non-ST elevation (NSTEMI) myocardial infarction: Secondary | ICD-10-CM | POA: Diagnosis not present

## 2011-06-12 HISTORY — DX: Old myocardial infarction: I25.2

## 2011-06-12 LAB — BASIC METABOLIC PANEL
BUN: 22 mg/dL (ref 6–23)
CO2: 24 mEq/L (ref 19–32)
Calcium: 8.5 mg/dL (ref 8.4–10.5)
Chloride: 101 mEq/L (ref 96–112)
Creatinine, Ser: 1.63 mg/dL — ABNORMAL HIGH (ref 0.50–1.35)
GFR calc Af Amer: 49 mL/min — ABNORMAL LOW (ref >90)

## 2011-06-12 LAB — CARDIAC PANEL(CRET KIN+CKTOT+MB+TROPI)
CK, MB: 8.9 ng/mL (ref 0.3–4.0)
Relative Index: 5.6 — ABNORMAL HIGH (ref 0.0–2.5)
Total CK: 187 U/L (ref 7–232)
Troponin I: 1.51 ng/mL (ref <0.30)
Troponin I: 1.63 ng/mL (ref <0.30)

## 2011-06-12 LAB — CBC
HCT: 28.5 % — ABNORMAL LOW (ref 39.0–52.0)
MCH: 28.4 pg (ref 26.0–34.0)
MCV: 84.3 fL (ref 78.0–100.0)
Platelets: 170 10*3/uL (ref 150–400)
RDW: 13.1 % (ref 11.5–15.5)
WBC: 5.6 10*3/uL (ref 4.0–10.5)

## 2011-06-12 LAB — GLUCOSE, CAPILLARY
Glucose-Capillary: 228 mg/dL — ABNORMAL HIGH (ref 70–99)
Glucose-Capillary: 183 mg/dL — ABNORMAL HIGH (ref 70–99)

## 2011-06-12 MED ORDER — LOSARTAN POTASSIUM 50 MG PO TABS
100.0000 mg | ORAL_TABLET | Freq: Every day | ORAL | Status: DC
  Administered 2011-06-13: 100 mg via ORAL
  Filled 2011-06-12 (×3): qty 2

## 2011-06-12 MED ORDER — PANTOPRAZOLE SODIUM 40 MG PO TBEC
40.0000 mg | DELAYED_RELEASE_TABLET | Freq: Every day | ORAL | Status: DC
  Administered 2011-06-12 – 2011-06-13 (×2): 40 mg via ORAL
  Filled 2011-06-12 (×2): qty 1

## 2011-06-12 MED ORDER — INSULIN ASPART 100 UNIT/ML ~~LOC~~ SOLN
10.0000 [IU] | Freq: Three times a day (TID) | SUBCUTANEOUS | Status: DC
  Administered 2011-06-12 – 2011-06-13 (×2): 10 [IU] via SUBCUTANEOUS
  Filled 2011-06-12: qty 3

## 2011-06-12 MED ORDER — PRASUGREL HCL 10 MG PO TABS
10.0000 mg | ORAL_TABLET | Freq: Once | ORAL | Status: AC
  Administered 2011-06-13: 10 mg via ORAL
  Filled 2011-06-12: qty 1

## 2011-06-12 MED ORDER — HYDROCHLOROTHIAZIDE 12.5 MG PO CAPS
12.5000 mg | ORAL_CAPSULE | Freq: Every day | ORAL | Status: DC
  Administered 2011-06-13: 12.5 mg via ORAL
  Filled 2011-06-12 (×2): qty 1

## 2011-06-12 MED ORDER — ASPIRIN 81 MG PO CHEW
81.0000 mg | CHEWABLE_TABLET | Freq: Every day | ORAL | Status: DC
  Administered 2011-06-13: 81 mg via ORAL
  Filled 2011-06-12: qty 1

## 2011-06-12 MED ORDER — OMEGA-3-ACID ETHYL ESTERS 1 G PO CAPS
1.0000 g | ORAL_CAPSULE | Freq: Two times a day (BID) | ORAL | Status: DC
  Administered 2011-06-12 – 2011-06-13 (×3): 1 g via ORAL
  Filled 2011-06-12 (×4): qty 1

## 2011-06-12 MED ORDER — SIMVASTATIN 10 MG PO TABS
10.0000 mg | ORAL_TABLET | Freq: Every day | ORAL | Status: DC
  Administered 2011-06-12: 10 mg via ORAL
  Filled 2011-06-12 (×2): qty 1

## 2011-06-12 MED ORDER — ASPIRIN 325 MG PO TABS: 325.0000 mg | ORAL_TABLET | Freq: Every day | ORAL | Status: DC

## 2011-06-12 MED ORDER — INSULIN ASPART 100 UNIT/ML ~~LOC~~ SOLN
0.0000 [IU] | Freq: Three times a day (TID) | SUBCUTANEOUS | Status: DC
  Administered 2011-06-12: 1 [IU] via SUBCUTANEOUS
  Administered 2011-06-12: 3 [IU] via SUBCUTANEOUS
  Administered 2011-06-13: 2 [IU] via SUBCUTANEOUS

## 2011-06-12 MED FILL — Dextrose Inj 5%: INTRAVENOUS | Qty: 50 | Status: AC

## 2011-06-12 NOTE — Progress Notes (Addendum)
Subjective:  pt rested well overnight, no concerns per NS. Sheath pulled without concerns for bleeding and or swelling. Pt S/p LHC with PCI/ Stent x 2 to RCA. No concerns for continued CP or dyspnea. Foley cath placed to help voiding o/n - removed after sheath pull.  No urine output since -- bladder scan pending. Integrilin infusion for 18 hrs complete.    Objective:  Vital Signs in the last 24 hours: Temp:  [97.4 F (36.3 C)-98.8 F (37.1 C)] 98.3 F (36.8 C) (12/20 0400) Pulse Rate:  [47-92] 87  (12/19 2340) Resp:  [12-21] 20  (12/20 0400) BP: (114-187)/(39-66) 114/39 mmHg (12/20 0500) SpO2:  [93 %-100 %] 96 % (12/19 2340) Arterial Line BP: (147-189)/(56-66) 147/56 mmHg (12/19 1900) Weight:  [92.987 kg (205 lb)] 205 lb (92.987 kg) (12/19 1300)  Intake/Output from previous day: 12/19 0701 - 12/20 0700 In: 1217.5 [P.O.:720; I.V.:495.5; IV Piggyback:2] Out: 2225 [Urine:2225] Intake/Output from this shift: Total I/O In: 1215.5 [P.O.:720; I.V.:495.5] Out: 225 [Urine:225]  Physical Exam: General appearance: alert, cooperative and no distress Neck: no adenopathy, soft L>R carotid bruit, no JVD, supple, symmetrical, trachea midline and thyroid not enlarged, symmetric, no tenderness/mass/nodules Lungs: clear to auscultation bilaterally and non-labored Heart: regular rate and rhythm, S1, S2 normal, no S3 or S4, systolic murmur: early systolic 2/6, crescendo, decrescendo and harsh at 2nd right intercostal space and no rub Abdomen: soft, non-tender; bowel sounds normal; no masses,  no organomegaly Extremities: extremities normal, atraumatic, no cyanosis or edema and R groin with mild ecchymoses, no bruit Pulses: 2+ and symmetric Skin: Skin color, texture, turgor normal. No rashes or lesions Neurologic: Alert and oriented X 3, normal strength and tone. Normal symmetric reflexes. Normal coordination and gait  Lab Results:  Basename 06/11/11 2002  WBC 9.8  HGB 11.4*  PLT 199   CBC: W  - 9.8 Hgb 11.4, Plt 199; Chem: Na/K 132 / 4.0; Cl/HCO3 101/24, Cr 1.63 (was 1.66 on pre-cath) Imaging: Imaging results have been reviewed  Cardiac Studies:  Assessment/Plan:  Principal Problem:  *Dyspnea Active Problems:  CAD (coronary artery disease), complex PCI/DES to RCA this admission  Type IVa MI - peri-PCI infarction during complex stenting of torutuos RCA.  Likely thromboembolic event with PDA occlusion.  Diabetes mellitus, type 2 IDDM  HTN (hypertension)  Dyslipidemia  Delirium, transient secondary to meds in cath lab  Resolved. Difficulty voiding -- ~167 ml on bladder scan.    LOS: 1 day    SIMON,SPENCER E 06/12/2011, 6:05 AM   I have seen & examined the patient (my exam above), reviewed the chart & findings.  I agree with the informational noted retrieved by Mr. Gilberto Better, [place].    PLAN:  1) CAD, s/p LHC with PCI/Stent x 2 RCA, POD #1 - with Type IVa MI.  On Integrilin o/n for 18hr due to concern for distal emobolization event, now completed.  He is asymptomatic - no CP/SOB.   Type IVa MI - will monitor cardiac biomarkers to ensure trending down.  - OP Echo to assess function.  Will need to monitor for an additional 24 hrs to ensure no complications.   On ASA/Plavix (DAP -- will switch to Effient per Dr. Rex Kras), statin & ARB.  BB held due to concern for bradycardia on admission & during cath/PCI procedure. 2) Poorly controlled DM 3)Htn - restarting Hyzaar (individual components) 4)GERD - may need PPI (protonix as outpt as he is now on DAP) 5)Anxiety d/o - Rx as per home therapy. 6) CKD -  Cr Stable - f/u chem tomorrow prior to d/c 7) Dyslipidemia (hypertriglyceridemia) - on statin & Lovaza. 8) Voiding difficulty - will see how he does once ambulating.  Bladder scan ~120ml, does not feel the urge.    He is stable for transfer to telemetry floor, but after discussion with RN, she feels that it may be better for him (due to concern for delerium) to remain in a now  "familiar" setting.  I agree.  Will keep him in [place].    Leonie Man, M.D., M.S. [facility] [address] [place], [place]  [address]  [phone]  06/12/2011 10:27 AM

## 2011-06-12 NOTE — Progress Notes (Signed)
Pt too sleepy to do consult at this time. RN would like me to visit another time.

## 2011-06-12 NOTE — Progress Notes (Signed)
Pt's bedrest expired; sat pt up on side of bed; checked groin site for bleeding; site WNL, level 1; assisted pt with ambulation around room and over to chair; reassessed pt's groin; site WNL; level 1.  Pt denies any pain or complaints.  Call bell in reach; wheels locked on chair; pt instructed to call when wish to get out of chair.

## 2011-06-12 NOTE — Progress Notes (Signed)
CARDIAC REHAB PHASE I   PRE:  Rate/Rhythm: 59 SB    BP: sitting 143/63    SaO2:   MODE:  Ambulation: 350 ft   POST:  Rate/Rhythm: 79    BP: sitting 143/48     SaO2:   SOB walking. Sts it is less than on admit. Hard to read pt., very negative. Ed completed. Pt anxious to go home. Agreed to have referral sent to [facility].  [id number]  Darrick Meigs CES, ACSM

## 2011-06-13 LAB — BASIC METABOLIC PANEL
BUN: 26 mg/dL — ABNORMAL HIGH (ref 6–23)
CO2: 27 mEq/L (ref 19–32)
Glucose, Bld: 172 mg/dL — ABNORMAL HIGH (ref 70–99)
Potassium: 4.3 mEq/L (ref 3.5–5.1)
Sodium: 132 mEq/L — ABNORMAL LOW (ref 135–145)

## 2011-06-13 LAB — GLUCOSE, CAPILLARY: Glucose-Capillary: 185 mg/dL — ABNORMAL HIGH (ref 70–99)

## 2011-06-13 LAB — CARDIAC PANEL(CRET KIN+CKTOT+MB+TROPI): CK, MB: 6.8 ng/mL (ref 0.3–4.0)

## 2011-06-13 MED ORDER — LOSARTAN POTASSIUM-HCTZ 100-12.5 MG PO TABS: 1.0000 | ORAL_TABLET | Freq: Every day | ORAL | Status: DC

## 2011-06-13 MED ORDER — PANTOPRAZOLE SODIUM 40 MG PO TBEC: 40.0000 mg | DELAYED_RELEASE_TABLET | Freq: Every day | ORAL | Status: DC

## 2011-06-13 MED ORDER — PRASUGREL HCL 10 MG PO TABS: 10.0000 mg | ORAL_TABLET | Freq: Once | ORAL | Status: DC

## 2011-06-13 MED ORDER — ACETAMINOPHEN 325 MG PO TABS: 650.0000 mg | ORAL_TABLET | ORAL | Status: AC | PRN

## 2011-06-13 MED ORDER — NITROGLYCERIN 0.4 MG/SPRAY TL SOLN: 1.0000 | Status: DC | PRN

## 2011-06-13 NOTE — Progress Notes (Signed)
Patient discharged home with discharge and medication instructions.

## 2011-06-13 NOTE — Discharge Summary (Signed)
Kenneth C. Hilty, MD Attending Cardiologist [facility]  

## 2011-06-13 NOTE — Progress Notes (Signed)
Pt. Seen and examined. Agree with the NP/PA-C note as written. Some chest soreness, no pain. Looks well. Groin site intact.  I feel he is okay for discharge today. Will need office follow-up with Dr. Rex Kras. Recommend BMP next week to evaluate for contrast nephropathy.  Pixie Casino, MD Attending Cardiologist [facility]

## 2011-06-13 NOTE — Progress Notes (Signed)
[facility]  Subjective: Feels good.  He is ready to go home.  He does have some chest soreness 2/10.  Not exacerbated with inspiration or palpation  Objective: Vital signs in last 24 hours: Temp:  [97.2 F (36.2 C)-98.4 F (36.9 C)] 98.4 F (36.9 C) (12/21 0740) Pulse Rate:  [60-70] 62  (12/21 0740) Resp:  [16-20] 16  (12/21 0423) BP: (111-146)/(38-59) 146/49 mmHg (12/21 0740) SpO2:  [91 %-97 %] 96 % (12/21 0740) Last BM Date: 06/10/11  Intake/Output from previous day: 12/20 0701 - 12/21 0700 In: 600 [P.O.:600] Out: 400 [Urine:400] Intake/Output this shift:    Medications Current Facility-Administered Medications  Medication Dose Route Frequency Provider Last Rate Last Dose  . 0.9 %  sodium chloride infusion  250 mL Intravenous PRN Fulton Reek, MD   250 mL at 06/12/11 0500  . acetaminophen (TYLENOL) tablet 650 mg  650 mg Oral Q4H PRN Fulton Reek, MD      . aspirin chewable tablet 81 mg  81 mg Oral Daily Leonie Man      . diazepam (VALIUM) tablet 2 mg  2 mg Oral Q6H PRN Fulton Reek, MD      . diazepam (VALIUM) tablet 2 mg  2 mg Oral Once Fulton Reek, MD      . hydrALAZINE (APRESOLINE) injection 20 mg  20 mg Intravenous Q6H PRN Fulton Reek, MD   20 mg at 06/11/11 1817  . hydrALAZINE (APRESOLINE) injection 20 mg  20 mg Intravenous Once Lorretta Harp, MD      . hydrochlorothiazide (MICROZIDE) capsule 12.5 mg  12.5 mg Oral Daily Leonie Man      . insulin aspart (novoLOG) injection 0-9 Units  0-9 Units Subcutaneous TID WC Laverle Hobby, PA   3 Units at 06/12/11 1757  . insulin aspart (novoLOG) injection 10 Units  10 Units Subcutaneous TID WC Leonie Man   10 Units at 06/12/11 1756  . losartan (COZAAR) tablet 100 mg  100 mg Oral Daily Leonie Man      . omega-3 acid ethyl esters (LOVAZA) capsule 1 g  1 g Oral BID Leonie Man   1 g at 06/12/11 2216  . ondansetron (ZOFRAN) injection 4 mg  4 mg Intravenous  Q6H PRN Fulton Reek, MD   4 mg at 06/11/11 1854  . oxyCODONE-acetaminophen (PERCOCET) 5-325 MG per tablet 1-2 tablet  1-2 tablet Oral Q4H PRN Fulton Reek, MD      . pantoprazole (PROTONIX) EC tablet 40 mg  40 mg Oral Q0600 Leonie Man   40 mg at 06/12/11 1327  . prasugrel (EFFIENT) tablet 10 mg  10 mg Oral Once Leonie Man      . simvastatin (ZOCOR) tablet 10 mg  10 mg Oral q1800 Leonie Man   10 mg at 06/12/11 1806  . sodium chloride 0.9 % injection 3 mL  3 mL Intravenous PRN Fulton Reek, MD      . sodium chloride 0.9 % injection 3 mL  3 mL Intravenous Q12H Fulton Reek, MD   3 mL at 06/12/11 2200  . sodium chloride 0.9 % injection 3 mL  3 mL Intravenous PRN Fulton Reek, MD      . DISCONTD: aspirin EC tablet 81 mg  81 mg Oral Daily Fulton Reek, MD   81 mg at 06/12/11 0955  . DISCONTD: aspirin tablet 325 mg  325 mg Oral Daily Leonie Man      . DISCONTD: cloNIDine (CATAPRES) tablet 0.1 mg  0.1 mg Oral Once Fulton Reek, MD      . DISCONTD: clopidogrel (PLAVIX) tablet 75 mg  75 mg Oral Q breakfast Fulton Reek, MD   75 mg at 06/12/11 0915    PE: General appearance: alert, cooperative and no distress Lungs: clear to auscultation bilaterally Heart: regular rate and rhythm, 1/6 soft sys MM. Extremities: No LEE Pulses: 2+ and symmetric Right groin:  Positive bruit, loud.  No hematoma,  nontender  Lab Results:   Basename 06/12/11 0600 06/11/11 2002  WBC 5.6 9.8  HGB 9.6* 11.4*  HCT 28.5* 33.5*  PLT 170 199   BMET  Basename 06/13/11 0003 06/12/11 0600  NA 132* 132*  K 4.3 4.0  CL 100 101  CO2 27 24  GLUCOSE 172* 147*  BUN 26* 22  CREATININE 1.66* 1.63*  CALCIUM 9.1 8.5     Assessment/Plan Principal Problem:  *Dyspnea Active Problems:  CAD (coronary artery disease), complex PCI/DES x2   to RCA this admission  Diabetes mellitus, type 2 IDDM  HTN (hypertension)  Dyslipidemia  Delirium, transient secondary to meds in cath lab   Type IVa MI - peri-PCI infarction during complex stenting of torutuos RCA.  Likely thromboembolic event with PDA occlusion.  Chronic kidney disease (CKD), stage II (mild)  Plan:  Pt states that his chest is a little sore.  ? angina symptoms.  May need a long acting nitrate. ? DC  He wants to go and looks good despite chest soreness.   LOS: 2 days    Jaielle Dlouhy W 06/13/2011 7:57 AM

## 2011-06-13 NOTE — Discharge Summary (Signed)
Patient ID: Lance Collier,  MRN: [id number], DOB/AGE: 1946/02/26 65 y.o.  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  Likely thromboembolic event with PDA occlusion.  Chronic kidney disease (CKD), stage II (mild) Cr 1.66 at discharge  HTN (hypertension)  Dyslipidemia, Zocor intol  Delirium, transient secondary to meds in cath lab    Procedures: Cath/PCI 06/11/11  History of Present Illness:  This 65 year old male was brought in for an outpatient cardiac catheterization because of worsening dyspnea on exertion this is been getting gradually worse over the last 6 months with progressively worse over the last several months. He had a cardiac catheterization 2003 that showed 50% mid LAD narrowing a nuclear stress test February of 2011 was negative for ischemia     Hospital Course: [name] is a 65 year old patient of Dr. little. He has a history of previous moderate coronary disease with a 50% RCA. This was in 2003. He had negative stress test in the office in February 2011. He was seen by Dr. little in the office with complaints of increasing dyspnea. Please see Dr. Eddie Dibbles complete office note from 04/25/2011. He does have diabetes and hypertension dyslipidemia and was felt to be at high risk for developing coronary disease. He was admitted for elective catheterization. His losartan HCTZ was held for 48 hours prior to admission because of a baseline creatinine of 1.7. Patient was admitted as a same-day admit and taken to the cath lab by Dr. little. She has Note for complete details. Patient had RCA disease progression. He underwent a complex RCA  intervention with drug-eluting stents. During the procedure the patient required sedation. He then became confused. Dr. little is concerned about a stroke and he was sent for an urgent CT scan of his head after his catheterization. Patient was also seen urgently but by Dr. Doy Mince for the in-house neurology service. CT scan showed no acute abnormalities and his delirium cleared. Ultimately was felt this was secondary to medications given to the cath lab. Patient was transferred back to the CCU. Subsequent cardiac enzymes came back positive with a troponin peak of 1.63. It's felt he had a type IVa peri procedure MI. We feel he can be discharged 06/13/2011. He'll have a BM P. drawn next week in the office. His creatinine at discharge is stable at 1.66. He'll follow up with Dr. Rex Kras Lysle Morales Eve at 11:30 AM. Discharge Vitals:  Blood pressure 146/49, pulse 62, temperature 98.4 F (36.9 C), temperature source Oral, resp. rate 16, height 5\' 9"  (1.753 m), weight 92.987 kg (205 lb), SpO2 96.00%.    Labs: Results for orders placed during the hospital encounter of 06/11/11 (from the past 48 hour(s))  POCT ACTIVATED CLOTTING TIME     Status: Normal   Collection Time   06/11/11 11:52 AM      Component Value Range Comment   Activated Clotting Time 342     POCT ACTIVATED CLOTTING TIME     Status: Normal   Collection Time   06/11/11 12:57 PM      Component Value Range Comment   Activated Clotting Time 292     GLUCOSE, CAPILLARY     Status: Normal   Collection Time  06/11/11  1:37 PM      Component Value Range Comment   Glucose-Capillary 80  70 - 99 (mg/dL)   GLUCOSE, CAPILLARY     Status: Abnormal   Collection Time   06/11/11  2:27 PM      Component Value Range Comment   Glucose-Capillary 100 (*) 70 - 99 (mg/dL)   POCT ACTIVATED CLOTTING TIME     Status: Normal   Collection Time   06/11/11  3:46 PM      Component Value Range Comment   Activated Clotting Time 215     GLUCOSE, CAPILLARY      Status: Normal   Collection Time   06/11/11  5:18 PM      Component Value Range Comment   Glucose-Capillary 84  70 - 99 (mg/dL)   POCT ACTIVATED CLOTTING TIME     Status: Normal   Collection Time   06/11/11  5:59 PM      Component Value Range Comment   Activated Clotting Time 166     GLUCOSE, CAPILLARY     Status: Normal   Collection Time   06/11/11  6:01 PM      Component Value Range Comment   Glucose-Capillary 88  70 - 99 (mg/dL)   CBC     Status: Abnormal   Collection Time   06/11/11  8:02 PM      Component Value Range Comment   WBC 9.8  4.0 - 10.5 (K/uL)    RBC 4.04 (*) 4.22 - 5.81 (MIL/uL)    Hemoglobin 11.4 (*) 13.0 - 17.0 (g/dL)    HCT 33.5 (*) 39.0 - 52.0 (%)    MCV 82.9  78.0 - 100.0 (fL)    MCH 28.2  26.0 - 34.0 (pg)    MCHC 34.0  30.0 - 36.0 (g/dL)    RDW 12.9  11.5 - 15.5 (%)    Platelets 199  150 - 400 (K/uL)   GLUCOSE, CAPILLARY     Status: Abnormal   Collection Time   06/11/11 10:30 PM      Component Value Range Comment   Glucose-Capillary 180 (*) 70 - 99 (mg/dL)   GLUCOSE, CAPILLARY     Status: Abnormal   Collection Time   06/12/11 12:14 AM      Component Value Range Comment   Glucose-Capillary 192 (*) 70 - 99 (mg/dL)   CBC     Status: Abnormal   Collection Time   06/12/11  6:00 AM      Component Value Range Comment   WBC 5.6  4.0 - 10.5 (K/uL)    RBC 3.38 (*) 4.22 - 5.81 (MIL/uL)    Hemoglobin 9.6 (*) 13.0 - 17.0 (g/dL)    HCT 28.5 (*) 39.0 - 52.0 (%)    MCV 84.3  78.0 - 100.0 (fL)    MCH 28.4  26.0 - 34.0 (pg)    MCHC 33.7  30.0 - 36.0 (g/dL)    RDW 13.1  11.5 - 15.5 (%)    Platelets 170  150 - 400 (K/uL)   BASIC METABOLIC PANEL     Status: Abnormal   Collection Time   06/12/11  6:00 AM      Component Value Range Comment   Sodium 132 (*) 135 - 145 (mEq/L)    Potassium 4.0  3.5 - 5.1 (mEq/L)    Chloride 101  96 - 112 (mEq/L)    CO2 24  19 - 32 (mEq/L)    Glucose, Bld 147 (*) 70 -  99 (mg/dL)    BUN 22  6 - 23 (mg/dL)    Creatinine, Ser 1.63  (*) 0.50 - 1.35 (mg/dL)    Calcium 8.5  8.4 - 10.5 (mg/dL)    GFR calc non Af Amer 42 (*) >90 (mL/min)    GFR calc Af Amer 49 (*) >90 (mL/min)   CARDIAC PANEL(CRET KIN+CKTOT+MB+TROPI)     Status: Abnormal   Collection Time   06/12/11  6:00 AM      Component Value Range Comment   Total CK 148  7 - 232 (U/L)    CK, MB 8.3 (*) 0.3 - 4.0 (ng/mL)    Troponin I 1.51 (*) <0.30 (ng/mL)    Relative Index 5.6 (*) 0.0 - 2.5    GLUCOSE, CAPILLARY     Status: Abnormal   Collection Time   06/12/11  8:00 AM      Component Value Range Comment   Glucose-Capillary 146 (*) 70 - 99 (mg/dL)   GLUCOSE, CAPILLARY     Status: Abnormal   Collection Time   06/12/11 11:58 AM      Component Value Range Comment   Glucose-Capillary 199 (*) 70 - 99 (mg/dL)   CARDIAC PANEL(CRET KIN+CKTOT+MB+TROPI)     Status: Abnormal   Collection Time   06/12/11  4:14 PM      Component Value Range Comment   Total CK 187  7 - 232 (U/L)    CK, MB 8.9 (*) 0.3 - 4.0 (ng/mL) CRITICAL VALUE NOTED.  VALUE IS CONSISTENT WITH PREVIOUSLY REPORTED AND CALLED VALUE.   Troponin I 1.63 (*) <0.30 (ng/mL)    Relative Index 4.8 (*) 0.0 - 2.5    GLUCOSE, CAPILLARY     Status: Abnormal   Collection Time   06/12/11  5:37 PM      Component Value Range Comment   Glucose-Capillary 228 (*) 70 - 99 (mg/dL)   GLUCOSE, CAPILLARY     Status: Abnormal   Collection Time   06/12/11 10:16 PM      Component Value Range Comment   Glucose-Capillary 183 (*) 70 - 99 (mg/dL)    Comment 1 Notify RN     CARDIAC PANEL(CRET KIN+CKTOT+MB+TROPI)     Status: Abnormal   Collection Time   06/13/11 12:03 AM      Component Value Range Comment   Total CK 162  7 - 232 (U/L)    CK, MB 6.8 (*) 0.3 - 4.0 (ng/mL) CRITICAL VALUE NOTED.  VALUE IS CONSISTENT WITH PREVIOUSLY REPORTED AND CALLED VALUE.   Troponin I 1.35 (*) <0.30 (ng/mL)    Relative Index 4.2 (*) 0.0 - 2.5    BASIC METABOLIC PANEL     Status: Abnormal   Collection Time   06/13/11 12:03 AM      Component  Value Range Comment   Sodium 132 (*) 135 - 145 (mEq/L)    Potassium 4.3  3.5 - 5.1 (mEq/L)    Chloride 100  96 - 112 (mEq/L)    CO2 27  19 - 32 (mEq/L)    Glucose, Bld 172 (*) 70 - 99 (mg/dL)    BUN 26 (*) 6 - 23 (mg/dL)    Creatinine, Ser 1.66 (*) 0.50 - 1.35 (mg/dL)    Calcium 9.1  8.4 - 10.5 (mg/dL)    GFR calc non Af Amer 41 (*) >90 (mL/min)    GFR calc Af Amer 48 (*) >90 (mL/min)   GLUCOSE, CAPILLARY     Status: Abnormal   Collection Time  06/13/11  7:42 AM      Component Value Range Comment   Glucose-Capillary 185 (*) 70 - 99 (mg/dL)     Disposition:  Follow-up Information    Follow up with LITTLE, ALFRED B, MD on 06/23/2011. (11:30)    Contact information:   [id number] [place] [phone]          Discharge Medications:  Current Discharge Medication List    START taking these medications   Details  acetaminophen (TYLENOL) 325 MG tablet Take 2 tablets (650 mg total) by mouth every 4 (four) hours as needed. Qty: 30 tablet    nitroGLYCERIN (NITROLINGUAL) 0.4 MG/SPRAY spray Place 1 spray under the tongue every 5 (five) minutes as needed for chest pain. Qty: 12 g, Refills: 3    pantoprazole (PROTONIX) 40 MG tablet Take 1 tablet (40 mg total) by mouth daily at 6 (six) AM. Qty: 30 tablet, Refills: 2    prasugrel (EFFIENT) 10 MG TABS Take 1 tablet (10 mg total) by mouth once. Qty: 30 tablet, Refills: 5      CONTINUE these medications which have CHANGED   Details  losartan-hydrochlorothiazide (HYZAAR) 100-12.5 MG per tablet Take 1 tablet by mouth daily.      CONTINUE these medications which have NOT CHANGED   Details  aspirin EC 81 MG tablet Take 81 mg by mouth daily.      insulin aspart (NOVOLOG) 100 UNIT/ML injection Inject 10-15 Units into the skin 3 (three) times daily before meals. 10 units with breakfast, 15 units with lunch, 15 units with dinner     insulin glargine (LANTUS) 100 UNIT/ML injection Inject 40-50  Units into the skin 2 (two) times daily. 40 units in am, 50 units in pm     omega-3 acid ethyl esters (LOVAZA) 1 G capsule Take by mouth 2 (two) times daily.      pravastatin (PRAVACHOL) 40 MG tablet Take 40 mg by mouth daily.        STOP taking these medications     Aspirin Buf,CaCarb-MgCarb-MgO, (BUFFERIN EXTRA STRENGTH) 500 MG TABS      nebivolol (BYSTOLIC) 5 MG tablet          Duration of Discharge Encounter: Greater than 30 minutes including physician time.  Angelena Form PA-C 06/13/2011 11:15 AM

## 2011-06-24 DIAGNOSIS — E1139 Type 2 diabetes mellitus with other diabetic ophthalmic complication: Secondary | ICD-10-CM

## 2011-06-24 DIAGNOSIS — IMO0002 Reserved for concepts with insufficient information to code with codable children: Secondary | ICD-10-CM

## 2011-06-24 HISTORY — DX: Type 2 diabetes mellitus with other diabetic ophthalmic complication: E11.39

## 2011-06-24 HISTORY — DX: Reserved for concepts with insufficient information to code with codable children: IMO0002

## 2011-06-25 DIAGNOSIS — E1142 Type 2 diabetes mellitus with diabetic polyneuropathy: Secondary | ICD-10-CM | POA: Diagnosis not present

## 2011-06-25 DIAGNOSIS — E1139 Type 2 diabetes mellitus with other diabetic ophthalmic complication: Secondary | ICD-10-CM | POA: Diagnosis not present

## 2011-06-25 DIAGNOSIS — Z23 Encounter for immunization: Secondary | ICD-10-CM | POA: Diagnosis not present

## 2011-06-25 DIAGNOSIS — D126 Benign neoplasm of colon, unspecified: Secondary | ICD-10-CM | POA: Diagnosis not present

## 2011-06-25 DIAGNOSIS — I2510 Atherosclerotic heart disease of native coronary artery without angina pectoris: Secondary | ICD-10-CM | POA: Diagnosis not present

## 2011-06-25 DIAGNOSIS — E1165 Type 2 diabetes mellitus with hyperglycemia: Secondary | ICD-10-CM | POA: Diagnosis not present

## 2011-06-27 DIAGNOSIS — R0602 Shortness of breath: Secondary | ICD-10-CM | POA: Diagnosis not present

## 2011-06-27 DIAGNOSIS — R079 Chest pain, unspecified: Secondary | ICD-10-CM | POA: Diagnosis not present

## 2011-06-27 DIAGNOSIS — I2510 Atherosclerotic heart disease of native coronary artery without angina pectoris: Secondary | ICD-10-CM | POA: Diagnosis not present

## 2011-06-27 DIAGNOSIS — R0609 Other forms of dyspnea: Secondary | ICD-10-CM | POA: Diagnosis not present

## 2011-06-27 DIAGNOSIS — R0989 Other specified symptoms and signs involving the circulatory and respiratory systems: Secondary | ICD-10-CM | POA: Diagnosis not present

## 2011-07-28 DIAGNOSIS — I2510 Atherosclerotic heart disease of native coronary artery without angina pectoris: Secondary | ICD-10-CM | POA: Diagnosis not present

## 2011-07-28 DIAGNOSIS — I10 Essential (primary) hypertension: Secondary | ICD-10-CM | POA: Diagnosis not present

## 2011-08-07 ENCOUNTER — Encounter (HOSPITAL_COMMUNITY)
Admission: RE | Admit: 2011-08-07 | Discharge: 2011-08-07 | Disposition: A | Discharge status: Home / Self Care | Payer: Medicare Other | Source: Ambulatory Visit | Attending: Cardiology | Admitting: Cardiology

## 2011-08-07 ENCOUNTER — Encounter (HOSPITAL_COMMUNITY): Payer: Self-pay

## 2011-08-07 DIAGNOSIS — E119 Type 2 diabetes mellitus without complications: Secondary | ICD-10-CM | POA: Insufficient documentation

## 2011-08-07 DIAGNOSIS — Z5189 Encounter for other specified aftercare: Secondary | ICD-10-CM | POA: Insufficient documentation

## 2011-08-07 DIAGNOSIS — I129 Hypertensive chronic kidney disease with stage 1 through stage 4 chronic kidney disease, or unspecified chronic kidney disease: Secondary | ICD-10-CM | POA: Insufficient documentation

## 2011-08-07 DIAGNOSIS — K219 Gastro-esophageal reflux disease without esophagitis: Secondary | ICD-10-CM | POA: Insufficient documentation

## 2011-08-07 DIAGNOSIS — Z7982 Long term (current) use of aspirin: Secondary | ICD-10-CM | POA: Insufficient documentation

## 2011-08-07 DIAGNOSIS — Z794 Long term (current) use of insulin: Secondary | ICD-10-CM | POA: Insufficient documentation

## 2011-08-07 DIAGNOSIS — Z87891 Personal history of nicotine dependence: Secondary | ICD-10-CM | POA: Insufficient documentation

## 2011-08-07 DIAGNOSIS — N182 Chronic kidney disease, stage 2 (mild): Secondary | ICD-10-CM | POA: Insufficient documentation

## 2011-08-07 DIAGNOSIS — F411 Generalized anxiety disorder: Secondary | ICD-10-CM | POA: Insufficient documentation

## 2011-08-07 DIAGNOSIS — Z79899 Other long term (current) drug therapy: Secondary | ICD-10-CM | POA: Insufficient documentation

## 2011-08-07 DIAGNOSIS — E785 Hyperlipidemia, unspecified: Secondary | ICD-10-CM | POA: Insufficient documentation

## 2011-08-07 DIAGNOSIS — Z9861 Coronary angioplasty status: Secondary | ICD-10-CM | POA: Insufficient documentation

## 2011-08-07 DIAGNOSIS — I252 Old myocardial infarction: Secondary | ICD-10-CM | POA: Insufficient documentation

## 2011-08-07 DIAGNOSIS — I2510 Atherosclerotic heart disease of native coronary artery without angina pectoris: Secondary | ICD-10-CM | POA: Insufficient documentation

## 2011-08-07 NOTE — Progress Notes (Signed)
Cardiac Rehab Medication Review  Does the patient  feel that his/her medications are working for him/her?  yes  Has the patient been experiencing any side effects to the medications prescribed?  no  Does the patient measure his/her own blood pressure or blood glucose at home?  yes   Does the patient have any problems obtaining medications due to transportation or finances?   no  Understanding of regimen: excellent Understanding of indications: good Potential of compliance: excellent  Pharmacist comments: patient checks blood glucose at home but does not check blood pressure    Gerrit Halls 08/07/2011 8:39 AM  .

## 2011-08-11 ENCOUNTER — Encounter (HOSPITAL_COMMUNITY)
Admission: RE | Admit: 2011-08-11 | Discharge: 2011-08-11 | Disposition: A | Discharge status: Home / Self Care | Payer: Medicare Other | Source: Ambulatory Visit | Attending: Cardiology | Admitting: Cardiology

## 2011-08-11 DIAGNOSIS — I252 Old myocardial infarction: Secondary | ICD-10-CM | POA: Diagnosis not present

## 2011-08-11 DIAGNOSIS — I2510 Atherosclerotic heart disease of native coronary artery without angina pectoris: Secondary | ICD-10-CM | POA: Diagnosis not present

## 2011-08-11 DIAGNOSIS — E785 Hyperlipidemia, unspecified: Secondary | ICD-10-CM | POA: Diagnosis not present

## 2011-08-11 DIAGNOSIS — N182 Chronic kidney disease, stage 2 (mild): Secondary | ICD-10-CM | POA: Diagnosis not present

## 2011-08-11 DIAGNOSIS — Z7982 Long term (current) use of aspirin: Secondary | ICD-10-CM | POA: Diagnosis not present

## 2011-08-11 DIAGNOSIS — I129 Hypertensive chronic kidney disease with stage 1 through stage 4 chronic kidney disease, or unspecified chronic kidney disease: Secondary | ICD-10-CM | POA: Diagnosis not present

## 2011-08-11 DIAGNOSIS — Z794 Long term (current) use of insulin: Secondary | ICD-10-CM | POA: Diagnosis not present

## 2011-08-11 DIAGNOSIS — Z79899 Other long term (current) drug therapy: Secondary | ICD-10-CM | POA: Diagnosis not present

## 2011-08-11 DIAGNOSIS — F411 Generalized anxiety disorder: Secondary | ICD-10-CM | POA: Diagnosis not present

## 2011-08-11 DIAGNOSIS — Z5189 Encounter for other specified aftercare: Secondary | ICD-10-CM | POA: Diagnosis not present

## 2011-08-11 DIAGNOSIS — Z9861 Coronary angioplasty status: Secondary | ICD-10-CM | POA: Diagnosis not present

## 2011-08-11 DIAGNOSIS — E119 Type 2 diabetes mellitus without complications: Secondary | ICD-10-CM | POA: Diagnosis not present

## 2011-08-11 DIAGNOSIS — Z87891 Personal history of nicotine dependence: Secondary | ICD-10-CM | POA: Diagnosis not present

## 2011-08-11 DIAGNOSIS — K219 Gastro-esophageal reflux disease without esophagitis: Secondary | ICD-10-CM | POA: Diagnosis not present

## 2011-08-11 LAB — GLUCOSE, CAPILLARY: Glucose-Capillary: 149 mg/dL — ABNORMAL HIGH (ref 70–99)

## 2011-08-11 NOTE — Progress Notes (Signed)
Pt started cardiac rehab today.  Pt tolerated light exercise without difficulty.  Monitor showed sr with no ectopy noted.  Continue to monitor.

## 2011-08-13 ENCOUNTER — Encounter (HOSPITAL_COMMUNITY)
Admission: RE | Admit: 2011-08-13 | Discharge: 2011-08-13 | Disposition: A | Discharge status: Home / Self Care | Payer: Medicare Other | Source: Ambulatory Visit | Attending: Cardiology | Admitting: Cardiology

## 2011-08-13 LAB — GLUCOSE, CAPILLARY: Glucose-Capillary: 178 mg/dL — ABNORMAL HIGH (ref 70–99)

## 2011-08-13 NOTE — Progress Notes (Signed)
Lance Collier 66 y.o. male       Nutrition Screen                                                                    YES  NO Do you live in a nursing home?  X   Do you eat out more than 3 times/week?   X  If yes, how many times per week do you eat out? 5-6  Do you have food allergies?   X If yes, what are you allergic to?  Have you gained or lost more than 10 lbs without trying?              X  If yes, how much weight have you lost and over what time period? 40 lbs gained over 20 years  Do you want to lose weight?    X  If yes, what is a goal weight or amount of weight you would like to lose? 20 lb  Do you eat alone most of the time?   X   Do you eat less than 2 meals/day?  X If yes, how many meals do you eat?  Do you drink more than 3 alcohol drinks/day?  X If yes, how many drinks per day?  Are you having trouble with constipation? *  X If yes, what are you doing to help relieve constipation?  Do you have financial difficulties with buying food?*    X   Are you experiencing regular nausea/ vomiting?*     X   Do you have a poor appetite? *                                        X   Do you have trouble chewing/swallowing? *   X    Pt with diagnoses of:  X Stent/ PTCA X GERD          X Dyslipidemia  / HDL< 40 / LDL>70 / High TG      X %  Body fat >goal / Body Mass Index >25 X HTN / BP >120/80 X MI        X DM/A1c >6 / CBG >126       Pt Risk Score   3       Diagnosis Risk Score  80       Total Risk Score   83                        X High Risk                Low Risk    HT: 68.5" Ht Readings from Last 1 Encounters:  08/07/11 5' 8.5" (1.74 m)    WT:   204.6 lb (93 kg) Wt Readings from Last 3 Encounters:  08/07/11 205 lb 0.4 oz (93 kg)  06/11/11 205 lb (92.987 kg)  06/11/11 205 lb (92.987 kg)     IBW 71.4 130%IBW BMI 30.7 33.8%body fat  Meds reviewed: Novolog 10-15 units TID before meals, Lantus 40-50 units BID, [place] Past Medical History  Diagnosis Date  .  Diabetes mellitus   . Shortness of breath   . Hypertension   . GERD (gastroesophageal reflux disease)   . Anxiety        Activity level: Pt is active   Wt goal: 180-192 lb ( 81.8-87.3 kg) Current tobacco use? No      Food/Drug Interaction? No       Labs:  Lipid Panel  No results found for this basename: chol, trig, hdl, cholhdl, vldl, ldlcalc   No results found for this basename: HGBA1C  Per 06/11/11 MD note HGBA1C 8.1 Per 06/23/11 MD note HGA1C 7.2 06/13/11 Glucose 185  LDL goal: < 70      DM and > 2:      HTN, > 66 yo male Estimated Daily Nutrition Needs for: ? wt loss  1550-2050 Kcal , Total Fat 40-55gm, Saturated Fat 11-16 gm, Trans Fat 1.5-2.0 gm,  Sodium less than 1500 mg

## 2011-08-15 ENCOUNTER — Encounter (HOSPITAL_COMMUNITY)
Admission: RE | Admit: 2011-08-15 | Discharge: 2011-08-15 | Disposition: A | Discharge status: Home / Self Care | Payer: Medicare Other | Source: Ambulatory Visit | Attending: Cardiology | Admitting: Cardiology

## 2011-08-18 ENCOUNTER — Encounter (HOSPITAL_COMMUNITY)
Admission: RE | Admit: 2011-08-18 | Discharge: 2011-08-18 | Disposition: A | Discharge status: Home / Self Care | Payer: Medicare Other | Source: Ambulatory Visit | Attending: Cardiology | Admitting: Cardiology

## 2011-08-20 ENCOUNTER — Encounter (HOSPITAL_COMMUNITY)
Admission: RE | Admit: 2011-08-20 | Discharge: 2011-08-20 | Disposition: A | Discharge status: Home / Self Care | Payer: Medicare Other | Source: Ambulatory Visit | Attending: Cardiology | Admitting: Cardiology

## 2011-08-22 ENCOUNTER — Encounter (HOSPITAL_COMMUNITY)
Admission: RE | Admit: 2011-08-22 | Discharge: 2011-08-22 | Disposition: A | Discharge status: Home / Self Care | Payer: Medicare Other | Source: Ambulatory Visit | Attending: Cardiology | Admitting: Cardiology

## 2011-08-22 DIAGNOSIS — Z87891 Personal history of nicotine dependence: Secondary | ICD-10-CM | POA: Insufficient documentation

## 2011-08-22 DIAGNOSIS — Z5189 Encounter for other specified aftercare: Secondary | ICD-10-CM | POA: Diagnosis not present

## 2011-08-22 DIAGNOSIS — F411 Generalized anxiety disorder: Secondary | ICD-10-CM | POA: Diagnosis not present

## 2011-08-22 DIAGNOSIS — E119 Type 2 diabetes mellitus without complications: Secondary | ICD-10-CM | POA: Diagnosis not present

## 2011-08-22 DIAGNOSIS — E785 Hyperlipidemia, unspecified: Secondary | ICD-10-CM | POA: Insufficient documentation

## 2011-08-22 DIAGNOSIS — K219 Gastro-esophageal reflux disease without esophagitis: Secondary | ICD-10-CM | POA: Insufficient documentation

## 2011-08-22 DIAGNOSIS — I252 Old myocardial infarction: Secondary | ICD-10-CM | POA: Diagnosis not present

## 2011-08-22 DIAGNOSIS — Z79899 Other long term (current) drug therapy: Secondary | ICD-10-CM | POA: Diagnosis not present

## 2011-08-22 DIAGNOSIS — Z794 Long term (current) use of insulin: Secondary | ICD-10-CM | POA: Insufficient documentation

## 2011-08-22 DIAGNOSIS — I2510 Atherosclerotic heart disease of native coronary artery without angina pectoris: Secondary | ICD-10-CM | POA: Diagnosis not present

## 2011-08-22 DIAGNOSIS — I129 Hypertensive chronic kidney disease with stage 1 through stage 4 chronic kidney disease, or unspecified chronic kidney disease: Secondary | ICD-10-CM | POA: Insufficient documentation

## 2011-08-22 DIAGNOSIS — N182 Chronic kidney disease, stage 2 (mild): Secondary | ICD-10-CM | POA: Insufficient documentation

## 2011-08-22 DIAGNOSIS — Z9861 Coronary angioplasty status: Secondary | ICD-10-CM | POA: Insufficient documentation

## 2011-08-22 DIAGNOSIS — Z7982 Long term (current) use of aspirin: Secondary | ICD-10-CM | POA: Insufficient documentation

## 2011-08-22 NOTE — Progress Notes (Signed)
Lance Collier 66 y.o. male Nutrition Note Spoke with pt.  Nutrition Plan and Nutrition Survey reviewed with pt. Pt is following Step 1 of the Therapeutic Lifestyle Changes diet. Pt denies ever following a weight loss plan. Pt states he exercises for "30-40 minutes daily at a THR of 110-120." Pt c/o weighing the same wt since he quit smoking in the 80's. Weight loss tips discussed. Per discussion with pt, pt wife does not like to cook so pt eats out "at least 5-6 meals a week." Pt tries to make healthy choices when eating out and is aware that he consumes too much sodium by eating out so frequently. Tips for eating out healthier discussed.  Pt is diabetic. Pt reports having met with an RD re: his DM diet previously.  No further DM education received.  Pt encouraged to attend DM classes offered via Cardiac rehab. Nutrition Diagnosis   Food-and nutrition-related knowledge deficit related to lack of exposure to information as related to diagnosis of: ? CVD ? DM (A1c 7.2)    Obesity related to excessive energy intake as evidenced by a BMI of 30.7  Nutrition RX/ Estimated Daily Nutrition Needs for: wt loss  1550-2050 Kcal, 40-50 gm fat, 11-16 gm sat fat, 1.5-2.0 gm trans-fat, <1500 mg sodium , 175-250 gm CHO   Nutrition Intervention   Pt's individual nutrition plan including cholesterol goals reviewed with pt.   Benefits of adopting Therapeutic Lifestyle Changes discussed when Medficts reviewed.   Pt to attend the Portion Distortion class   Pt to attend the ? Nutrition I class                         ? Nutrition II class        ? Diabetes Blitz class       ? Diabetes Q & A class   Pt given handouts for: ? wt loss ? DM    Continue client-centered nutrition education by RD, as part of interdisciplinary care. Goal(s)   Pt to identify food quantities necessary to achieve: ? wt loss to a goal wt of 180-192 lb (81.8-87.3 kg) at graduation from cardiac rehab.    Use pre-meal and post-meal CBG's and A1c  to determine whether adjustments in food/meal planning will be beneficial or if any meds need to be combined with nutrition therapy. Monitor and Evaluate progress toward nutrition goal with team.

## 2011-08-25 ENCOUNTER — Encounter (HOSPITAL_COMMUNITY)
Admission: RE | Admit: 2011-08-25 | Discharge: 2011-08-25 | Disposition: A | Discharge status: Home / Self Care | Payer: Medicare Other | Source: Ambulatory Visit | Attending: Cardiology | Admitting: Cardiology

## 2011-08-27 ENCOUNTER — Encounter (HOSPITAL_COMMUNITY)
Admission: RE | Admit: 2011-08-27 | Discharge: 2011-08-27 | Disposition: A | Discharge status: Home / Self Care | Payer: Medicare Other | Source: Ambulatory Visit | Attending: Cardiology | Admitting: Cardiology

## 2011-08-29 ENCOUNTER — Encounter (HOSPITAL_COMMUNITY)
Admission: RE | Admit: 2011-08-29 | Discharge: 2011-08-29 | Disposition: A | Discharge status: Home / Self Care | Payer: Medicare Other | Source: Ambulatory Visit | Attending: Cardiology | Admitting: Cardiology

## 2011-09-01 ENCOUNTER — Encounter (HOSPITAL_COMMUNITY)
Admission: RE | Admit: 2011-09-01 | Discharge: 2011-09-01 | Disposition: A | Discharge status: Home / Self Care | Payer: Medicare Other | Source: Ambulatory Visit | Attending: Cardiology | Admitting: Cardiology

## 2011-09-03 ENCOUNTER — Encounter (HOSPITAL_COMMUNITY)
Admission: RE | Admit: 2011-09-03 | Discharge: 2011-09-03 | Disposition: A | Discharge status: Home / Self Care | Payer: Medicare Other | Source: Ambulatory Visit | Attending: Cardiology | Admitting: Cardiology

## 2011-09-05 ENCOUNTER — Encounter (HOSPITAL_COMMUNITY)
Admission: RE | Admit: 2011-09-05 | Discharge: 2011-09-05 | Disposition: A | Discharge status: Home / Self Care | Payer: Medicare Other | Source: Ambulatory Visit | Attending: Cardiology | Admitting: Cardiology

## 2011-09-08 ENCOUNTER — Encounter (HOSPITAL_COMMUNITY)
Admission: RE | Admit: 2011-09-08 | Discharge: 2011-09-08 | Disposition: A | Discharge status: Home / Self Care | Payer: Medicare Other | Source: Ambulatory Visit | Attending: Cardiology | Admitting: Cardiology

## 2011-09-10 ENCOUNTER — Encounter (HOSPITAL_COMMUNITY)
Admission: RE | Admit: 2011-09-10 | Discharge: 2011-09-10 | Disposition: A | Discharge status: Home / Self Care | Payer: Medicare Other | Source: Ambulatory Visit | Attending: Cardiology | Admitting: Cardiology

## 2011-09-12 ENCOUNTER — Encounter (HOSPITAL_COMMUNITY)
Admission: RE | Admit: 2011-09-12 | Discharge: 2011-09-12 | Disposition: A | Discharge status: Home / Self Care | Payer: Medicare Other | Source: Ambulatory Visit | Attending: Cardiology | Admitting: Cardiology

## 2011-09-15 ENCOUNTER — Encounter (HOSPITAL_COMMUNITY)
Admission: RE | Admit: 2011-09-15 | Discharge: 2011-09-15 | Disposition: A | Discharge status: Home / Self Care | Payer: Medicare Other | Source: Ambulatory Visit | Attending: Cardiology | Admitting: Cardiology

## 2011-09-15 LAB — GLUCOSE, CAPILLARY: Glucose-Capillary: 80 mg/dL (ref 70–99)

## 2011-09-15 NOTE — Progress Notes (Signed)
Pt with 86 blood sugar reading at home.  Pt ate breakfast.  Checked blood sugar at rehab.  Pt with blood sugar reading of 80.  Pt took all 10 units of his insulin and stated that he knew he should probably only take half or none.  Pt given snack of pb and crackers along with lemonade.   After 15 minutes pt blood sugar rechecked 100.  Pt able to proceed with exercise.  Instructed pt to eat another snack when he gets home and monitor his blood sugar closely for any drops due to insulin administration.

## 2011-09-17 ENCOUNTER — Encounter (HOSPITAL_COMMUNITY)
Admission: RE | Admit: 2011-09-17 | Discharge: 2011-09-17 | Disposition: A | Discharge status: Home / Self Care | Payer: Medicare Other | Source: Ambulatory Visit | Attending: Cardiology | Admitting: Cardiology

## 2011-09-19 ENCOUNTER — Encounter (HOSPITAL_COMMUNITY)
Admission: RE | Admit: 2011-09-19 | Discharge: 2011-09-19 | Disposition: A | Discharge status: Home / Self Care | Payer: Medicare Other | Source: Ambulatory Visit | Attending: Cardiology | Admitting: Cardiology

## 2011-09-22 ENCOUNTER — Encounter (HOSPITAL_COMMUNITY)
Admission: RE | Admit: 2011-09-22 | Discharge: 2011-09-22 | Disposition: A | Discharge status: Home / Self Care | Payer: Medicare Other | Source: Ambulatory Visit | Attending: Cardiology | Admitting: Cardiology

## 2011-09-22 DIAGNOSIS — I252 Old myocardial infarction: Secondary | ICD-10-CM | POA: Insufficient documentation

## 2011-09-22 DIAGNOSIS — E119 Type 2 diabetes mellitus without complications: Secondary | ICD-10-CM | POA: Insufficient documentation

## 2011-09-22 DIAGNOSIS — E785 Hyperlipidemia, unspecified: Secondary | ICD-10-CM | POA: Diagnosis not present

## 2011-09-22 DIAGNOSIS — Z87891 Personal history of nicotine dependence: Secondary | ICD-10-CM | POA: Diagnosis not present

## 2011-09-22 DIAGNOSIS — Z79899 Other long term (current) drug therapy: Secondary | ICD-10-CM | POA: Diagnosis not present

## 2011-09-22 DIAGNOSIS — F411 Generalized anxiety disorder: Secondary | ICD-10-CM | POA: Diagnosis not present

## 2011-09-22 DIAGNOSIS — I129 Hypertensive chronic kidney disease with stage 1 through stage 4 chronic kidney disease, or unspecified chronic kidney disease: Secondary | ICD-10-CM | POA: Diagnosis not present

## 2011-09-22 DIAGNOSIS — Z794 Long term (current) use of insulin: Secondary | ICD-10-CM | POA: Diagnosis not present

## 2011-09-22 DIAGNOSIS — Z7982 Long term (current) use of aspirin: Secondary | ICD-10-CM | POA: Insufficient documentation

## 2011-09-22 DIAGNOSIS — Z5189 Encounter for other specified aftercare: Secondary | ICD-10-CM | POA: Diagnosis not present

## 2011-09-22 DIAGNOSIS — Z9861 Coronary angioplasty status: Secondary | ICD-10-CM | POA: Diagnosis not present

## 2011-09-22 DIAGNOSIS — K219 Gastro-esophageal reflux disease without esophagitis: Secondary | ICD-10-CM | POA: Diagnosis not present

## 2011-09-22 DIAGNOSIS — I2510 Atherosclerotic heart disease of native coronary artery without angina pectoris: Secondary | ICD-10-CM | POA: Insufficient documentation

## 2011-09-22 DIAGNOSIS — N182 Chronic kidney disease, stage 2 (mild): Secondary | ICD-10-CM | POA: Diagnosis not present

## 2011-09-24 ENCOUNTER — Encounter (HOSPITAL_COMMUNITY)
Admission: RE | Admit: 2011-09-24 | Discharge: 2011-09-24 | Disposition: A | Discharge status: Home / Self Care | Payer: Medicare Other | Source: Ambulatory Visit | Attending: Cardiology | Admitting: Cardiology

## 2011-09-26 ENCOUNTER — Encounter (HOSPITAL_COMMUNITY)
Admission: RE | Admit: 2011-09-26 | Discharge: 2011-09-26 | Disposition: A | Discharge status: Home / Self Care | Payer: Medicare Other | Source: Ambulatory Visit | Attending: Cardiology | Admitting: Cardiology

## 2011-09-29 ENCOUNTER — Encounter (HOSPITAL_COMMUNITY): Payer: Medicare Other

## 2011-10-01 ENCOUNTER — Encounter (HOSPITAL_COMMUNITY): Payer: Medicare Other

## 2011-10-03 ENCOUNTER — Encounter (HOSPITAL_COMMUNITY): Payer: Medicare Other

## 2011-10-06 ENCOUNTER — Encounter (HOSPITAL_COMMUNITY)
Admission: RE | Admit: 2011-10-06 | Discharge: 2011-10-06 | Disposition: A | Discharge status: Home / Self Care | Payer: Medicare Other | Source: Ambulatory Visit | Attending: Cardiology | Admitting: Cardiology

## 2011-10-07 DIAGNOSIS — E11359 Type 2 diabetes mellitus with proliferative diabetic retinopathy without macular edema: Secondary | ICD-10-CM | POA: Diagnosis not present

## 2011-10-07 DIAGNOSIS — H43399 Other vitreous opacities, unspecified eye: Secondary | ICD-10-CM | POA: Diagnosis not present

## 2011-10-07 DIAGNOSIS — E1139 Type 2 diabetes mellitus with other diabetic ophthalmic complication: Secondary | ICD-10-CM | POA: Diagnosis not present

## 2011-10-07 DIAGNOSIS — H35729 Serous detachment of retinal pigment epithelium, unspecified eye: Secondary | ICD-10-CM | POA: Diagnosis not present

## 2011-10-08 ENCOUNTER — Encounter (HOSPITAL_COMMUNITY)
Admission: RE | Admit: 2011-10-08 | Discharge: 2011-10-08 | Disposition: A | Discharge status: Home / Self Care | Payer: Medicare Other | Source: Ambulatory Visit | Attending: Cardiology | Admitting: Cardiology

## 2011-10-08 DIAGNOSIS — E785 Hyperlipidemia, unspecified: Secondary | ICD-10-CM | POA: Diagnosis not present

## 2011-10-08 DIAGNOSIS — E1139 Type 2 diabetes mellitus with other diabetic ophthalmic complication: Secondary | ICD-10-CM | POA: Diagnosis not present

## 2011-10-08 DIAGNOSIS — I2510 Atherosclerotic heart disease of native coronary artery without angina pectoris: Secondary | ICD-10-CM | POA: Diagnosis not present

## 2011-10-08 DIAGNOSIS — I10 Essential (primary) hypertension: Secondary | ICD-10-CM | POA: Diagnosis not present

## 2011-10-10 ENCOUNTER — Encounter (HOSPITAL_COMMUNITY)
Admission: RE | Admit: 2011-10-10 | Discharge: 2011-10-10 | Disposition: A | Discharge status: Home / Self Care | Payer: Medicare Other | Source: Ambulatory Visit | Attending: Cardiology | Admitting: Cardiology

## 2011-10-13 ENCOUNTER — Encounter (HOSPITAL_COMMUNITY)
Admission: RE | Admit: 2011-10-13 | Discharge: 2011-10-13 | Disposition: A | Discharge status: Home / Self Care | Payer: Medicare Other | Source: Ambulatory Visit | Attending: Cardiology | Admitting: Cardiology

## 2011-10-15 ENCOUNTER — Encounter (HOSPITAL_COMMUNITY)
Admission: RE | Admit: 2011-10-15 | Discharge: 2011-10-15 | Disposition: A | Discharge status: Home / Self Care | Payer: Medicare Other | Source: Ambulatory Visit | Attending: Cardiology | Admitting: Cardiology

## 2011-10-17 ENCOUNTER — Encounter (HOSPITAL_COMMUNITY): Payer: Medicare Other

## 2011-10-20 ENCOUNTER — Encounter (HOSPITAL_COMMUNITY)
Admission: RE | Admit: 2011-10-20 | Discharge: 2011-10-20 | Disposition: A | Discharge status: Home / Self Care | Payer: Medicare Other | Source: Ambulatory Visit | Attending: Cardiology | Admitting: Cardiology

## 2011-10-22 ENCOUNTER — Encounter (HOSPITAL_COMMUNITY)
Admission: RE | Admit: 2011-10-22 | Discharge: 2011-10-22 | Disposition: A | Discharge status: Home / Self Care | Payer: Medicare Other | Source: Ambulatory Visit | Attending: Cardiology | Admitting: Cardiology

## 2011-10-22 DIAGNOSIS — N182 Chronic kidney disease, stage 2 (mild): Secondary | ICD-10-CM | POA: Insufficient documentation

## 2011-10-22 DIAGNOSIS — I252 Old myocardial infarction: Secondary | ICD-10-CM | POA: Insufficient documentation

## 2011-10-22 DIAGNOSIS — Z5189 Encounter for other specified aftercare: Secondary | ICD-10-CM | POA: Diagnosis not present

## 2011-10-22 DIAGNOSIS — K219 Gastro-esophageal reflux disease without esophagitis: Secondary | ICD-10-CM | POA: Insufficient documentation

## 2011-10-22 DIAGNOSIS — Z79899 Other long term (current) drug therapy: Secondary | ICD-10-CM | POA: Diagnosis not present

## 2011-10-22 DIAGNOSIS — Z9861 Coronary angioplasty status: Secondary | ICD-10-CM | POA: Insufficient documentation

## 2011-10-22 DIAGNOSIS — I129 Hypertensive chronic kidney disease with stage 1 through stage 4 chronic kidney disease, or unspecified chronic kidney disease: Secondary | ICD-10-CM | POA: Insufficient documentation

## 2011-10-22 DIAGNOSIS — E785 Hyperlipidemia, unspecified: Secondary | ICD-10-CM | POA: Insufficient documentation

## 2011-10-22 DIAGNOSIS — Z87891 Personal history of nicotine dependence: Secondary | ICD-10-CM | POA: Insufficient documentation

## 2011-10-22 DIAGNOSIS — I2510 Atherosclerotic heart disease of native coronary artery without angina pectoris: Secondary | ICD-10-CM | POA: Diagnosis not present

## 2011-10-22 DIAGNOSIS — Z7982 Long term (current) use of aspirin: Secondary | ICD-10-CM | POA: Diagnosis not present

## 2011-10-22 DIAGNOSIS — E119 Type 2 diabetes mellitus without complications: Secondary | ICD-10-CM | POA: Diagnosis not present

## 2011-10-22 DIAGNOSIS — Z794 Long term (current) use of insulin: Secondary | ICD-10-CM | POA: Diagnosis not present

## 2011-10-22 DIAGNOSIS — F411 Generalized anxiety disorder: Secondary | ICD-10-CM | POA: Insufficient documentation

## 2011-10-24 ENCOUNTER — Encounter (HOSPITAL_COMMUNITY): Payer: Medicare Other

## 2011-10-27 ENCOUNTER — Encounter (HOSPITAL_COMMUNITY)
Admission: RE | Admit: 2011-10-27 | Discharge: 2011-10-27 | Disposition: A | Discharge status: Home / Self Care | Payer: Medicare Other | Source: Ambulatory Visit | Attending: Cardiology | Admitting: Cardiology

## 2011-10-29 ENCOUNTER — Encounter (HOSPITAL_COMMUNITY)
Admission: RE | Admit: 2011-10-29 | Discharge: 2011-10-29 | Disposition: A | Discharge status: Home / Self Care | Payer: Medicare Other | Source: Ambulatory Visit | Attending: Cardiology | Admitting: Cardiology

## 2011-10-31 ENCOUNTER — Encounter (HOSPITAL_COMMUNITY)
Admission: RE | Admit: 2011-10-31 | Discharge: 2011-10-31 | Disposition: A | Discharge status: Home / Self Care | Payer: Medicare Other | Source: Ambulatory Visit | Attending: Cardiology | Admitting: Cardiology

## 2011-10-31 DIAGNOSIS — Z961 Presence of intraocular lens: Secondary | ICD-10-CM | POA: Diagnosis not present

## 2011-10-31 DIAGNOSIS — H2510 Age-related nuclear cataract, unspecified eye: Secondary | ICD-10-CM | POA: Diagnosis not present

## 2011-10-31 DIAGNOSIS — E119 Type 2 diabetes mellitus without complications: Secondary | ICD-10-CM | POA: Diagnosis not present

## 2011-11-03 ENCOUNTER — Encounter (HOSPITAL_COMMUNITY)
Admission: RE | Admit: 2011-11-03 | Discharge: 2011-11-03 | Disposition: A | Discharge status: Home / Self Care | Payer: Medicare Other | Source: Ambulatory Visit | Attending: Cardiology | Admitting: Cardiology

## 2011-11-04 DIAGNOSIS — I2510 Atherosclerotic heart disease of native coronary artery without angina pectoris: Secondary | ICD-10-CM | POA: Diagnosis not present

## 2011-11-04 DIAGNOSIS — I10 Essential (primary) hypertension: Secondary | ICD-10-CM | POA: Diagnosis not present

## 2011-11-04 DIAGNOSIS — I739 Peripheral vascular disease, unspecified: Secondary | ICD-10-CM | POA: Diagnosis not present

## 2011-11-05 ENCOUNTER — Encounter (HOSPITAL_COMMUNITY)
Admission: RE | Admit: 2011-11-05 | Discharge: 2011-11-05 | Disposition: A | Discharge status: Home / Self Care | Payer: Medicare Other | Source: Ambulatory Visit | Attending: Cardiology | Admitting: Cardiology

## 2011-11-07 ENCOUNTER — Encounter (HOSPITAL_COMMUNITY): Payer: Medicare Other

## 2011-11-10 ENCOUNTER — Encounter (HOSPITAL_COMMUNITY)
Admission: RE | Admit: 2011-11-10 | Discharge: 2011-11-10 | Disposition: A | Discharge status: Home / Self Care | Payer: Medicare Other | Source: Ambulatory Visit | Attending: Cardiology | Admitting: Cardiology

## 2011-11-11 DIAGNOSIS — H2510 Age-related nuclear cataract, unspecified eye: Secondary | ICD-10-CM | POA: Diagnosis not present

## 2011-11-12 ENCOUNTER — Encounter (HOSPITAL_COMMUNITY)
Admission: RE | Admit: 2011-11-12 | Discharge: 2011-11-12 | Disposition: A | Discharge status: Home / Self Care | Payer: Medicare Other | Source: Ambulatory Visit | Attending: Cardiology | Admitting: Cardiology

## 2011-11-14 ENCOUNTER — Encounter (HOSPITAL_COMMUNITY): Payer: Self-pay

## 2011-11-14 ENCOUNTER — Encounter (HOSPITAL_COMMUNITY)
Admission: RE | Admit: 2011-11-14 | Discharge: 2011-11-14 | Disposition: A | Discharge status: Home / Self Care | Payer: Medicare Other | Source: Ambulatory Visit | Attending: Cardiology | Admitting: Cardiology

## 2011-11-17 DIAGNOSIS — H25019 Cortical age-related cataract, unspecified eye: Secondary | ICD-10-CM | POA: Diagnosis not present

## 2011-11-17 DIAGNOSIS — H2510 Age-related nuclear cataract, unspecified eye: Secondary | ICD-10-CM | POA: Diagnosis not present

## 2011-11-17 DIAGNOSIS — E11329 Type 2 diabetes mellitus with mild nonproliferative diabetic retinopathy without macular edema: Secondary | ICD-10-CM | POA: Diagnosis not present

## 2011-11-17 DIAGNOSIS — E1139 Type 2 diabetes mellitus with other diabetic ophthalmic complication: Secondary | ICD-10-CM | POA: Diagnosis not present

## 2011-12-09 DIAGNOSIS — E1139 Type 2 diabetes mellitus with other diabetic ophthalmic complication: Secondary | ICD-10-CM | POA: Diagnosis not present

## 2011-12-09 DIAGNOSIS — E11359 Type 2 diabetes mellitus with proliferative diabetic retinopathy without macular edema: Secondary | ICD-10-CM | POA: Diagnosis not present

## 2011-12-09 DIAGNOSIS — H211X9 Other vascular disorders of iris and ciliary body, unspecified eye: Secondary | ICD-10-CM | POA: Diagnosis not present

## 2012-01-13 DIAGNOSIS — R079 Chest pain, unspecified: Secondary | ICD-10-CM | POA: Diagnosis not present

## 2012-01-13 DIAGNOSIS — I2510 Atherosclerotic heart disease of native coronary artery without angina pectoris: Secondary | ICD-10-CM | POA: Diagnosis not present

## 2012-01-13 DIAGNOSIS — I10 Essential (primary) hypertension: Secondary | ICD-10-CM | POA: Diagnosis not present

## 2012-01-19 DIAGNOSIS — E11359 Type 2 diabetes mellitus with proliferative diabetic retinopathy without macular edema: Secondary | ICD-10-CM | POA: Diagnosis not present

## 2012-01-19 DIAGNOSIS — H4310 Vitreous hemorrhage, unspecified eye: Secondary | ICD-10-CM | POA: Diagnosis not present

## 2012-01-19 DIAGNOSIS — H3340 Traction detachment of retina, unspecified eye: Secondary | ICD-10-CM | POA: Diagnosis not present

## 2012-02-03 DIAGNOSIS — E1139 Type 2 diabetes mellitus with other diabetic ophthalmic complication: Secondary | ICD-10-CM | POA: Diagnosis not present

## 2012-02-03 DIAGNOSIS — H4310 Vitreous hemorrhage, unspecified eye: Secondary | ICD-10-CM | POA: Diagnosis not present

## 2012-02-03 DIAGNOSIS — H3340 Traction detachment of retina, unspecified eye: Secondary | ICD-10-CM | POA: Diagnosis not present

## 2012-02-03 DIAGNOSIS — E11359 Type 2 diabetes mellitus with proliferative diabetic retinopathy without macular edema: Secondary | ICD-10-CM | POA: Diagnosis not present

## 2012-02-17 DIAGNOSIS — E1139 Type 2 diabetes mellitus with other diabetic ophthalmic complication: Secondary | ICD-10-CM | POA: Diagnosis not present

## 2012-02-17 DIAGNOSIS — E11359 Type 2 diabetes mellitus with proliferative diabetic retinopathy without macular edema: Secondary | ICD-10-CM | POA: Diagnosis not present

## 2012-02-17 DIAGNOSIS — E1142 Type 2 diabetes mellitus with diabetic polyneuropathy: Secondary | ICD-10-CM | POA: Diagnosis not present

## 2012-02-17 DIAGNOSIS — N058 Unspecified nephritic syndrome with other morphologic changes: Secondary | ICD-10-CM | POA: Diagnosis not present

## 2012-02-18 DIAGNOSIS — E1139 Type 2 diabetes mellitus with other diabetic ophthalmic complication: Secondary | ICD-10-CM | POA: Diagnosis not present

## 2012-02-18 DIAGNOSIS — H5460 Unqualified visual loss, one eye, unspecified: Secondary | ICD-10-CM | POA: Diagnosis not present

## 2012-02-18 DIAGNOSIS — H4310 Vitreous hemorrhage, unspecified eye: Secondary | ICD-10-CM | POA: Diagnosis not present

## 2012-02-18 DIAGNOSIS — E11359 Type 2 diabetes mellitus with proliferative diabetic retinopathy without macular edema: Secondary | ICD-10-CM | POA: Diagnosis not present

## 2012-03-11 DIAGNOSIS — I6529 Occlusion and stenosis of unspecified carotid artery: Secondary | ICD-10-CM | POA: Diagnosis not present

## 2012-03-11 DIAGNOSIS — I2510 Atherosclerotic heart disease of native coronary artery without angina pectoris: Secondary | ICD-10-CM | POA: Diagnosis not present

## 2012-05-05 DIAGNOSIS — Z951 Presence of aortocoronary bypass graft: Secondary | ICD-10-CM | POA: Diagnosis not present

## 2012-05-05 DIAGNOSIS — I2510 Atherosclerotic heart disease of native coronary artery without angina pectoris: Secondary | ICD-10-CM | POA: Diagnosis not present

## 2012-05-05 DIAGNOSIS — E119 Type 2 diabetes mellitus without complications: Secondary | ICD-10-CM | POA: Diagnosis not present

## 2012-05-05 DIAGNOSIS — I10 Essential (primary) hypertension: Secondary | ICD-10-CM | POA: Diagnosis not present

## 2012-05-27 DIAGNOSIS — H10409 Unspecified chronic conjunctivitis, unspecified eye: Secondary | ICD-10-CM | POA: Diagnosis not present

## 2012-05-27 DIAGNOSIS — H5789 Other specified disorders of eye and adnexa: Secondary | ICD-10-CM | POA: Diagnosis not present

## 2012-06-18 DIAGNOSIS — I10 Essential (primary) hypertension: Secondary | ICD-10-CM | POA: Diagnosis not present

## 2012-06-18 DIAGNOSIS — I2510 Atherosclerotic heart disease of native coronary artery without angina pectoris: Secondary | ICD-10-CM | POA: Diagnosis not present

## 2012-06-18 DIAGNOSIS — E1139 Type 2 diabetes mellitus with other diabetic ophthalmic complication: Secondary | ICD-10-CM | POA: Diagnosis not present

## 2012-06-18 DIAGNOSIS — E785 Hyperlipidemia, unspecified: Secondary | ICD-10-CM | POA: Diagnosis not present

## 2012-06-21 DIAGNOSIS — R197 Diarrhea, unspecified: Secondary | ICD-10-CM | POA: Diagnosis not present

## 2012-06-21 DIAGNOSIS — I10 Essential (primary) hypertension: Secondary | ICD-10-CM | POA: Diagnosis not present

## 2012-06-28 DIAGNOSIS — R197 Diarrhea, unspecified: Secondary | ICD-10-CM | POA: Diagnosis not present

## 2012-07-26 DIAGNOSIS — E11359 Type 2 diabetes mellitus with proliferative diabetic retinopathy without macular edema: Secondary | ICD-10-CM | POA: Diagnosis not present

## 2012-07-26 DIAGNOSIS — E1139 Type 2 diabetes mellitus with other diabetic ophthalmic complication: Secondary | ICD-10-CM | POA: Diagnosis not present

## 2012-07-26 DIAGNOSIS — H4310 Vitreous hemorrhage, unspecified eye: Secondary | ICD-10-CM | POA: Diagnosis not present

## 2012-08-02 ENCOUNTER — Other Ambulatory Visit (HOSPITAL_COMMUNITY): Payer: Self-pay | Admitting: Cardiology

## 2012-08-02 DIAGNOSIS — E782 Mixed hyperlipidemia: Secondary | ICD-10-CM | POA: Diagnosis not present

## 2012-08-02 DIAGNOSIS — I2510 Atherosclerotic heart disease of native coronary artery without angina pectoris: Secondary | ICD-10-CM

## 2012-08-02 DIAGNOSIS — Z9861 Coronary angioplasty status: Secondary | ICD-10-CM | POA: Diagnosis not present

## 2012-08-02 DIAGNOSIS — I359 Nonrheumatic aortic valve disorder, unspecified: Secondary | ICD-10-CM

## 2012-08-02 DIAGNOSIS — E119 Type 2 diabetes mellitus without complications: Secondary | ICD-10-CM | POA: Diagnosis not present

## 2012-08-12 DIAGNOSIS — E11359 Type 2 diabetes mellitus with proliferative diabetic retinopathy without macular edema: Secondary | ICD-10-CM | POA: Diagnosis not present

## 2012-08-12 DIAGNOSIS — E1139 Type 2 diabetes mellitus with other diabetic ophthalmic complication: Secondary | ICD-10-CM | POA: Diagnosis not present

## 2012-08-12 DIAGNOSIS — H4310 Vitreous hemorrhage, unspecified eye: Secondary | ICD-10-CM | POA: Diagnosis not present

## 2012-08-21 DIAGNOSIS — IMO0001 Reserved for inherently not codable concepts without codable children: Secondary | ICD-10-CM

## 2012-08-21 HISTORY — DX: Reserved for inherently not codable concepts without codable children: IMO0001

## 2012-09-06 ENCOUNTER — Ambulatory Visit (HOSPITAL_COMMUNITY)
Admission: RE | Admit: 2012-09-06 | Discharge: 2012-09-06 | Disposition: A | Discharge status: Home / Self Care | Payer: Medicare Other | Source: Ambulatory Visit | Attending: Cardiology | Admitting: Cardiology

## 2012-09-06 DIAGNOSIS — I2510 Atherosclerotic heart disease of native coronary artery without angina pectoris: Secondary | ICD-10-CM | POA: Diagnosis not present

## 2012-09-06 DIAGNOSIS — I359 Nonrheumatic aortic valve disorder, unspecified: Secondary | ICD-10-CM | POA: Diagnosis not present

## 2012-09-06 NOTE — Progress Notes (Signed)
2D Echo Performed 09/06/2012    Marygrace Drought, RCS

## 2012-09-14 DIAGNOSIS — E1139 Type 2 diabetes mellitus with other diabetic ophthalmic complication: Secondary | ICD-10-CM | POA: Diagnosis not present

## 2012-09-14 DIAGNOSIS — E11359 Type 2 diabetes mellitus with proliferative diabetic retinopathy without macular edema: Secondary | ICD-10-CM | POA: Diagnosis not present

## 2012-09-21 DIAGNOSIS — N058 Unspecified nephritic syndrome with other morphologic changes: Secondary | ICD-10-CM | POA: Diagnosis not present

## 2012-09-21 DIAGNOSIS — I10 Essential (primary) hypertension: Secondary | ICD-10-CM | POA: Diagnosis not present

## 2012-09-21 DIAGNOSIS — E11359 Type 2 diabetes mellitus with proliferative diabetic retinopathy without macular edema: Secondary | ICD-10-CM | POA: Diagnosis not present

## 2012-09-21 DIAGNOSIS — E1139 Type 2 diabetes mellitus with other diabetic ophthalmic complication: Secondary | ICD-10-CM | POA: Diagnosis not present

## 2012-09-21 DIAGNOSIS — I2510 Atherosclerotic heart disease of native coronary artery without angina pectoris: Secondary | ICD-10-CM | POA: Diagnosis not present

## 2012-09-21 DIAGNOSIS — E1142 Type 2 diabetes mellitus with diabetic polyneuropathy: Secondary | ICD-10-CM | POA: Diagnosis not present

## 2012-09-21 DIAGNOSIS — R059 Cough, unspecified: Secondary | ICD-10-CM | POA: Diagnosis not present

## 2012-09-21 DIAGNOSIS — R05 Cough: Secondary | ICD-10-CM | POA: Diagnosis not present

## 2012-09-21 DIAGNOSIS — E785 Hyperlipidemia, unspecified: Secondary | ICD-10-CM | POA: Diagnosis not present

## 2012-10-18 DIAGNOSIS — H211X9 Other vascular disorders of iris and ciliary body, unspecified eye: Secondary | ICD-10-CM | POA: Diagnosis not present

## 2012-10-18 DIAGNOSIS — H4310 Vitreous hemorrhage, unspecified eye: Secondary | ICD-10-CM | POA: Diagnosis not present

## 2012-10-20 DIAGNOSIS — E11311 Type 2 diabetes mellitus with unspecified diabetic retinopathy with macular edema: Secondary | ICD-10-CM | POA: Diagnosis not present

## 2012-10-20 DIAGNOSIS — E1139 Type 2 diabetes mellitus with other diabetic ophthalmic complication: Secondary | ICD-10-CM | POA: Diagnosis not present

## 2012-10-20 DIAGNOSIS — E11359 Type 2 diabetes mellitus with proliferative diabetic retinopathy without macular edema: Secondary | ICD-10-CM | POA: Diagnosis not present

## 2012-10-20 DIAGNOSIS — H211X9 Other vascular disorders of iris and ciliary body, unspecified eye: Secondary | ICD-10-CM | POA: Diagnosis not present

## 2012-10-20 DIAGNOSIS — H40019 Open angle with borderline findings, low risk, unspecified eye: Secondary | ICD-10-CM | POA: Diagnosis not present

## 2012-10-25 DIAGNOSIS — H4310 Vitreous hemorrhage, unspecified eye: Secondary | ICD-10-CM | POA: Diagnosis not present

## 2012-10-25 DIAGNOSIS — H211X9 Other vascular disorders of iris and ciliary body, unspecified eye: Secondary | ICD-10-CM | POA: Diagnosis not present

## 2012-10-29 DIAGNOSIS — E1139 Type 2 diabetes mellitus with other diabetic ophthalmic complication: Secondary | ICD-10-CM | POA: Diagnosis not present

## 2012-10-29 DIAGNOSIS — E11359 Type 2 diabetes mellitus with proliferative diabetic retinopathy without macular edema: Secondary | ICD-10-CM | POA: Diagnosis not present

## 2012-10-29 DIAGNOSIS — H4310 Vitreous hemorrhage, unspecified eye: Secondary | ICD-10-CM | POA: Diagnosis not present

## 2012-10-29 DIAGNOSIS — H211X9 Other vascular disorders of iris and ciliary body, unspecified eye: Secondary | ICD-10-CM | POA: Diagnosis not present

## 2012-11-18 DIAGNOSIS — Z1211 Encounter for screening for malignant neoplasm of colon: Secondary | ICD-10-CM | POA: Diagnosis not present

## 2012-11-18 DIAGNOSIS — K439 Ventral hernia without obstruction or gangrene: Secondary | ICD-10-CM | POA: Diagnosis not present

## 2012-11-18 DIAGNOSIS — H43819 Vitreous degeneration, unspecified eye: Secondary | ICD-10-CM | POA: Diagnosis not present

## 2012-11-18 DIAGNOSIS — Z86010 Personal history of colonic polyps: Secondary | ICD-10-CM | POA: Diagnosis not present

## 2012-11-18 DIAGNOSIS — E1139 Type 2 diabetes mellitus with other diabetic ophthalmic complication: Secondary | ICD-10-CM | POA: Diagnosis not present

## 2012-11-18 DIAGNOSIS — K219 Gastro-esophageal reflux disease without esophagitis: Secondary | ICD-10-CM | POA: Diagnosis not present

## 2012-11-18 DIAGNOSIS — H4310 Vitreous hemorrhage, unspecified eye: Secondary | ICD-10-CM | POA: Diagnosis not present

## 2012-11-18 DIAGNOSIS — H3560 Retinal hemorrhage, unspecified eye: Secondary | ICD-10-CM | POA: Diagnosis not present

## 2012-11-18 DIAGNOSIS — E11359 Type 2 diabetes mellitus with proliferative diabetic retinopathy without macular edema: Secondary | ICD-10-CM | POA: Diagnosis not present

## 2012-11-23 ENCOUNTER — Ambulatory Visit (INDEPENDENT_AMBULATORY_CARE_PROVIDER_SITE_OTHER): Payer: Medicare Other | Admitting: Cardiology

## 2012-11-23 ENCOUNTER — Encounter: Payer: Self-pay | Admitting: Cardiology

## 2012-11-23 VITALS — BP 140/60 | HR 71 | Ht 69.0 in | Wt 211.0 lb

## 2012-11-23 DIAGNOSIS — E785 Hyperlipidemia, unspecified: Secondary | ICD-10-CM

## 2012-11-23 DIAGNOSIS — I2510 Atherosclerotic heart disease of native coronary artery without angina pectoris: Secondary | ICD-10-CM

## 2012-11-23 DIAGNOSIS — R011 Cardiac murmur, unspecified: Secondary | ICD-10-CM | POA: Diagnosis not present

## 2012-11-23 DIAGNOSIS — R0609 Other forms of dyspnea: Secondary | ICD-10-CM | POA: Diagnosis not present

## 2012-11-23 DIAGNOSIS — R0989 Other specified symptoms and signs involving the circulatory and respiratory systems: Secondary | ICD-10-CM

## 2012-11-23 DIAGNOSIS — R0600 Dyspnea, unspecified: Secondary | ICD-10-CM

## 2012-11-23 DIAGNOSIS — I10 Essential (primary) hypertension: Secondary | ICD-10-CM

## 2012-11-23 MED ORDER — NITROGLYCERIN 0.4 MG/SPRAY TL SOLN: 1.0000 | Status: DC | PRN

## 2012-11-23 MED ORDER — PANTOPRAZOLE SODIUM 40 MG PO TBEC: 40.0000 mg | DELAYED_RELEASE_TABLET | Freq: Every day | ORAL | Status: DC

## 2012-11-23 MED ORDER — ISOSORBIDE MONONITRATE ER 30 MG PO TB24: 30.0000 mg | ORAL_TABLET | Freq: Every day | ORAL | Status: DC

## 2012-11-23 MED ORDER — NITROGLYCERIN 0.4 MG SL SUBL: 0.4000 mg | SUBLINGUAL_TABLET | SUBLINGUAL | Status: DC | PRN

## 2012-11-23 MED ORDER — NEBIVOLOL HCL 2.5 MG PO TABS: 2.5000 mg | ORAL_TABLET | Freq: Every day | ORAL | Status: DC

## 2012-11-23 NOTE — Assessment & Plan Note (Signed)
Blood pressure but elevated today. I think it is having some anginal symptoms I would like him on beta blocker. We'll start Bystolic 2.5 mg daily to avoid any confusion of the fatigue-type symptoms that may occur with or beta blockers. He is otherwise operated as an ARB.Marland Kitchen

## 2012-11-23 NOTE — Assessment & Plan Note (Addendum)
On Statin & Fibrate. Followed by PCP.  Should be due for lab check soon.

## 2012-11-23 NOTE — Assessment & Plan Note (Signed)
I am concerned that this could be his anginal equivalent. Will order [employer]. Start Imdur 30 mg & Bystolic 2.5 mg F/u 2-3 months.

## 2012-11-23 NOTE — Assessment & Plan Note (Signed)
No evidence of stenosis or regurgitation. Simply aortic sclerosis which explains the aortic murmur.

## 2012-11-23 NOTE — Assessment & Plan Note (Addendum)
Due to retinal bleeding - stopped Effient, only on ASA.  Plan is to convert to Plavix alone once the eyes are stable.   On ARB & Statin -- starting BB as well as Imdur and when necessary Nitroglycerin Also has colonoscopy planned in Aug with Dr. Collene Mares.

## 2012-11-23 NOTE — Progress Notes (Signed)
Patient ID: Lance Collier, male   DOB: 1945-10-16, 67 y.o.   MRN: [id number]  Clinic Note: HPI: Lance Collier is a 67 y.o. long-standing patient of Dr. Chase Picket with essentially single-vessel coronary disease status post 2 overlapping DES in the RCA in 02/2011 for progressively worsening dyspnea on exertion and fatigue, and below who presents today for  work in to evaluate shortness of breath and questions with his antiplatelet regimen. Following his PCI he did have a Myoview which was normal with no evidence of ischemia.  He is a successful lawyer here in town.  Interval History:  Since I last saw him he's been troubled with a retinal bleeding which she sees Dr. Zadie Rhine. He was told really cannot do much with any exercise wand ae've exertional. Since then he has really become pretty much lethargic and has no energy. He is noting more shortness of breath with getting up and down doing things. He states that touches him on long as the 2 strips and feels short of breath and tired. He simply walks 3 blocks before he gets out of breath. And this is definitely a decrease in systolic from before.  He never has any chest discomfort either during the time of his PCI or now. He denies any PND, orthopnea or significant significant edema. He denies claudication symptoms. He denies any palpitations, lightheadedness, dizziness, wooziness, CT/near-syncope. No TIA or amaurosis fugax symptoms. No melena, hematochezia or hematuria.  He says his left eye vision is getting better after his Avastin injections, stating these are stable see something of that eye now but continues to improve. Dr. Zadie Rhine called to ask if he did adjust his antiplatelet regimen. We agreed on the plan to stop Effient to discontinue the aspirin. Once the bleeding as subsided and stabilizes, the plan was to switch over to Plavix. I also got request from Dr. Collene Mares and GI to see if he could potentially stop his aspirin and August for a  colonoscopy, by which time he  will be on Plavix.  The remainder of his Cardiovascular ROS: positive for - dyspnea on exertion and murmur negative for - chest pain, edema, irregular heartbeat, loss of consciousness, orthopnea, palpitations, paroxysmal nocturnal dyspnea, rapid heart rate or shortness of breath is as follows:    Past Medical History  Diagnosis Date  . Diabetes mellitus   . Shortness of breath   . Hypertension   . GERD (gastroesophageal reflux disease)   . Anxiety   . CAD (coronary artery disease), native coronary artery   . Presence of drug coated stent in right coronary artery September 2012    Subtotal proximal RCA (tortuous) --> complex PCI requiring Guideliner: 2 overlapping Promus element DES stents.  . 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  Likely thromboembolic event with PDA occlusion. 06/12/2011    Very difficult, complex procedure requiring multiple guidewires, guide-liner, etc.  Angiographic evidence of distal thrombo / anthero-embolism as likely etiology of Troponin of ~1.58. Pt remains asymptomatic.     Prior cardiac evaluation and past surgical history: Past Surgical History  Procedure Laterality Date  . Coronary angioplasty with stent placement Right September 2012    2 overlapping Promus DES 2.7 mm at 12 mm x2; postdilated to 3 mm  . Tonsillectomy    . Myoview stress test  January '13    Diaphragmatic attenuation versus inferior infarct. No ischemia. Normal EF normal wall motion.    Allergies  Allergen Reactions  . Ibuprofen Hives    Current Outpatient Prescriptions  Medication Sig Dispense Refill  . aspirin EC 81 MG tablet Take 81 mg by mouth daily.        . fenofibrate 160 MG tablet Take 160 mg by mouth daily.      Marland Kitchen HUMALOG KWIKPEN 100 UNIT/ML SOPN Sliding scale      . insulin glargine (LANTUS) 100 UNIT/ML injection Inject 40-50 Units into the skin 2 (two) times daily. 40 units in am,  50 units in pm       . pantoprazole (PROTONIX) 40 MG tablet Take 1 tablet (40 mg total) by mouth daily at 6 (six) AM.  30 tablet  6  . pravastatin (PRAVACHOL) 40 MG tablet Take 40 mg by mouth daily.        . valsartan-hydrochlorothiazide (DIOVAN-HCT) 160-12.5 MG per tablet Take 1 tablet by mouth daily.      . isosorbide mononitrate (IMDUR) 30 MG 24 hr tablet Take 1 tablet (30 mg total) by mouth daily.  30 tablet  12  . nebivolol (BYSTOLIC) 2.5 MG tablet Take 1 tablet (2.5 mg total) by mouth daily.  30 tablet  12  . nitroGLYCERIN (NITROSTAT) 0.4 MG SL tablet Place 1 tablet (0.4 mg total) under the tongue every 5 (five) minutes as needed for chest pain.  25 tablet  3   No current facility-administered medications for this visit.    History   Social History  . Marital Status: Single    Spouse Name: N/A    Number of Children: N/A  . Years of Education: N/A   Occupational History  . Not on file.   Social History Main Topics  . Smoking status: Former Smoker -- 2.00 packs/day    Types: Cigarettes    Quit date: 11/23/1996  . Smokeless tobacco: Never Used  . Alcohol Use: 2.2 oz/week    2 Glasses of wine, 0 Cans of beer, 0 Shots of liquor, 2 Drinks containing 0.5 oz of alcohol per week  . Drug Use: No  . Sexually Active: Not on file   Other Topics Concern  . Not on file   Social History Narrative  . No narrative on file    ROS: A comprehensive Review of Systems - Negative except His visual symptoms from his retinal bleeding, and other pertinent positives as above.  PHYSICAL EXAM BP 140/60  Pulse 71  Ht 5\' 9"  (1.753 m)  Wt 211 lb (95.709 kg)  BMI 31.15 kg/m2 General appearance: alert, cooperative, appears stated age, no distress, mildly obese and Normal, pleasant mood and affect Neck: no JVD, supple, symmetrical, trachea midline and Soft right carotid bruit. Lungs: clear to auscultation bilaterally, normal percussion bilaterally and Nonlabored, normal effort. Good air  movement Heart: regular rate and rhythm, S1, S2 normal, no S3 or S4, systolic murmur: systolic ejection 2/6, crescendo, decrescendo and harsh at 2nd right intercostal space, radiates to carotids and no rub Abdomen: soft, non-tender; bowel sounds normal; no masses,  no organomegaly and Moderate truncal obesity Extremities: extremities normal, atraumatic, no cyanosis or edema Pulses: 2+ and symmetric Neurologic: Grossly normal HEENT: [place], AP, EOMI, MMM  [id number] today: Yes Rate:71 , Rhythm: Normal sinus rhythm, otherwise normal ECG   ASSESSMENT: Increased dyspnea on exertion from previous visit.  DOE (dyspnea on exertion) - Plan: EKG 12-Lead, Myocardial Perfusion Imaging  CAD (coronary artery disease), complex PCI/DES to RCA this admission - Plan: EKG 12-Lead, Myocardial Perfusion Imaging  Dyslipidemia, Zocor intol  Murmur,  cardiac   HTN (hypertension)  PLAN: Per problem list. Orders Placed This Encounter  Procedures  . Myocardial Perfusion Imaging    Standing Status: Future     Number of Occurrences:      Standing Expiration Date: 11/23/2013    Order Specific Question:  Where should this test be performed    Answer:  [facility]    Order Specific Question:  Type of stress    Answer:  Lexiscan    Order Specific Question:  Patient weight in lbs    Answer:  211  . EKG [id number]   Bystolic 2.5 mg daily, Imdur 30 mg, sublingual nitroglycerin glycerin when necessary  Followup: 3 MONTHS   HARDING,DAVID W, M.D., M.S. [facility] [address] [place], [place]  [address]  [phone] Pager # [phone] 11/23/2012 2:44 PM

## 2012-11-23 NOTE — Patient Instructions (Addendum)
Continue to stop Effient  -- taking only Aspirin  I am adding 2 new medicines for artery disease possible angina (as the reason for his shortness of breath with exertion):  Bystolic 2.5 mg -- this is a beta blocker medication should help decrease her resting heart rate in your heart rate response to exercise. His heart protective.  Imdur 30 mg daily -- this is a long-acting nitroglycerin medicine  Also refilling your as needed nitroglycerin.  I will refill your Protonix/pantoprazole  We will check a LexiScan Myoview to make sure that the reason for shortness but does not do to issue with the stents or other artery disease.  Possibly see her back in roughly 3 months to see how this is doing. This would give [place] time to adjust to Dr. Dahlia Bailiff evaluation of your her eyes.  Leonie Man, MD

## 2012-11-26 ENCOUNTER — Ambulatory Visit (HOSPITAL_COMMUNITY)
Admission: RE | Admit: 2012-11-26 | Discharge: 2012-11-26 | Disposition: A | Discharge status: Home / Self Care | Payer: Medicare Other | Source: Ambulatory Visit | Attending: Cardiology | Admitting: Cardiology

## 2012-11-26 DIAGNOSIS — I2510 Atherosclerotic heart disease of native coronary artery without angina pectoris: Secondary | ICD-10-CM | POA: Diagnosis not present

## 2012-11-26 DIAGNOSIS — R0609 Other forms of dyspnea: Secondary | ICD-10-CM | POA: Insufficient documentation

## 2012-11-26 DIAGNOSIS — R0600 Dyspnea, unspecified: Secondary | ICD-10-CM

## 2012-11-26 DIAGNOSIS — R42 Dizziness and giddiness: Secondary | ICD-10-CM | POA: Diagnosis not present

## 2012-11-26 DIAGNOSIS — R5381 Other malaise: Secondary | ICD-10-CM | POA: Diagnosis not present

## 2012-11-26 DIAGNOSIS — R0989 Other specified symptoms and signs involving the circulatory and respiratory systems: Secondary | ICD-10-CM

## 2012-11-26 DIAGNOSIS — R55 Syncope and collapse: Secondary | ICD-10-CM | POA: Diagnosis not present

## 2012-11-26 DIAGNOSIS — R5383 Other fatigue: Secondary | ICD-10-CM | POA: Insufficient documentation

## 2012-11-26 DIAGNOSIS — R0602 Shortness of breath: Secondary | ICD-10-CM | POA: Insufficient documentation

## 2012-11-26 MED ORDER — TECHNETIUM TC 99M SESTAMIBI GENERIC - CARDIOLITE
29.5000 | Freq: Once | INTRAVENOUS | Status: AC | PRN
  Administered 2012-11-26: 30 via INTRAVENOUS

## 2012-11-26 MED ORDER — TECHNETIUM TC 99M SESTAMIBI GENERIC - CARDIOLITE
11.0000 | Freq: Once | INTRAVENOUS | Status: AC | PRN
  Administered 2012-11-26: 11 via INTRAVENOUS

## 2012-11-26 MED ORDER — REGADENOSON 0.4 MG/5ML IV SOLN
0.4000 mg | Freq: Once | INTRAVENOUS | Status: AC
  Administered 2012-11-26: 0.4 mg via INTRAVENOUS

## 2012-11-26 NOTE — Procedures (Addendum)
[facility] [address] [place] [address] [id number]  Cardiology Nuclear Med Study  Lance Collier is a 67 y.o. male     MRN : [id number]     DOB: 1946/01/12  Procedure Date: 11/26/2012  Nuclear Med Background Indication for Stress Test:  Graft Patency and Stent Patency History:  Asthma and CAD;STENT/PTCA--06/11/2011 Cardiac Risk Factors: Claudication, History of Smoking, Hypertension, IDDM Type 2, Lipids, Obesity and TIA  Symptoms:  Dizziness, DOE, Fatigue, Light-Headedness, Near Syncope and SOB   Nuclear Pre-Procedure Caffeine/Decaff Intake:  7:00pm NPO After: 5:00am   IV Site: R Forearm  IV 0.9% NS with Angio Cath:  22g  Chest Size (in):  46"  IV Started by: Azucena Cecil, RN  Height: 5\' 9"  (1.753 m)  Cup Size: n/a  BMI:  Body mass index is 31.15 kg/(m^2). Weight:  211 lb (95.709 kg)   Tech Comments:  N/A     Nuclear Med Study 1 or 2 day study: 1 day  Stress Test Type:  Lexiscan  Order Authorizing Provider:  Glenetta Hew, MD   Resting Radionuclide: Technetium 44m Sestamibi  Resting Radionuclide Dose: 11.0 mCi   Stress Radionuclide:  Technetium 27m Sestamibi  Stress Radionuclide Dose: 29.5 mCi           Stress Protocol Rest HR: 68 Stress HR: 58  Rest BP: 130/60 Stress BP: 125/54  Exercise Time (min): n/a METS: n/a   Predicted Max HR: 152 bpm % Max HR: 38.16 bpm Rate Pressure Product: 7540  Dose of Adenosine (mg):  n/a Dose of Lexiscan: 0.4 mg  Dose of Atropine (mg): n/a Dose of Dobutamine: n/a mcg/kg/min (at max HR)  Stress Test Technologist: Leane Para, CCT Nuclear Technologist: Imagene Riches, CNMT   Rest Procedure:  Myocardial perfusion imaging was performed at rest 45 minutes following the intravenous administration of Technetium 64m Sestamibi. Stress Procedure:  The patient received IV Lexiscan 0.4 mg over 15-seconds.  Technetium 6m Sestamibi injected at 30-seconds.  There were no  significant changes with Lexiscan.  Quantitative spect images were obtained after a 45 minute delay.  Transient Ischemic Dilatation (Normal <1.22):  1.02 Lung/Heart Ratio (Normal <0.45):  0.28 QGS EDV:  106 ml QGS ESV:  42 ml LV Ejection Fraction: 61%  Signed by Imagene Riches, CNMT  PHYSICIAN INTERPRETATION  Rest ECG: NSR with non-specific ST-T wave changes and NSR-LVH  Stress ECG: No significant change from baseline ECG  QPS Raw Data Images:  Mild diaphragmatic attenuation.  Normal left ventricular size. Stress Images:  There is decreased uptake in the inferior wall. Rest Images:  There is decreased uptake in the inferior wall. Subtraction (SDS):  There is a fixed inferior defect that is most consistent with diaphragmatic attenuation. No reversibility is appreciated.There is no evidence of scar or ischemia.  Impression Exercise Capacity:  Lexiscan with no exercise. BP Response:  Normal blood pressure response. Clinical Symptoms:  There is dyspnea. Fullness. ECG Impression:  No significant ECG changes with Lexiscan. LV Wall Motion:  NL LV Function; NL Wall Motion   Comparison with Prior Nuclear Study: No images to compare  Overall Impression:  Normal stress nuclear study. and Low risk stress nuclear study with minimal diaphragmatic attenuation.Marland Kitchen    Leonie Man, MD  11/26/2012 6:08 PM

## 2012-11-29 ENCOUNTER — Telehealth: Payer: Self-pay | Admitting: *Deleted

## 2012-11-29 NOTE — Telephone Encounter (Signed)
Spoke to patient. Results given.

## 2012-11-29 NOTE — Telephone Encounter (Signed)
Message copied by Raiford Simmonds on Mon Nov 29, 2012  6:55 PM ------      Message from: [facility], DAVID      Created: Fri Nov 26, 2012  8:30 PM       Good news -- no sign of ischemia or infarction.            Leonie Man, MD       ------

## 2012-11-30 DIAGNOSIS — E11311 Type 2 diabetes mellitus with unspecified diabetic retinopathy with macular edema: Secondary | ICD-10-CM | POA: Diagnosis not present

## 2012-11-30 DIAGNOSIS — E1139 Type 2 diabetes mellitus with other diabetic ophthalmic complication: Secondary | ICD-10-CM | POA: Diagnosis not present

## 2012-11-30 DIAGNOSIS — E11359 Type 2 diabetes mellitus with proliferative diabetic retinopathy without macular edema: Secondary | ICD-10-CM | POA: Diagnosis not present

## 2012-12-22 DIAGNOSIS — I2510 Atherosclerotic heart disease of native coronary artery without angina pectoris: Secondary | ICD-10-CM | POA: Diagnosis not present

## 2012-12-22 DIAGNOSIS — Z6831 Body mass index (BMI) 31.0-31.9, adult: Secondary | ICD-10-CM | POA: Diagnosis not present

## 2012-12-22 DIAGNOSIS — E11359 Type 2 diabetes mellitus with proliferative diabetic retinopathy without macular edema: Secondary | ICD-10-CM | POA: Diagnosis not present

## 2012-12-22 DIAGNOSIS — E785 Hyperlipidemia, unspecified: Secondary | ICD-10-CM | POA: Diagnosis not present

## 2012-12-22 DIAGNOSIS — N058 Unspecified nephritic syndrome with other morphologic changes: Secondary | ICD-10-CM | POA: Diagnosis not present

## 2012-12-22 DIAGNOSIS — E1139 Type 2 diabetes mellitus with other diabetic ophthalmic complication: Secondary | ICD-10-CM | POA: Diagnosis not present

## 2012-12-22 DIAGNOSIS — I10 Essential (primary) hypertension: Secondary | ICD-10-CM | POA: Diagnosis not present

## 2012-12-22 DIAGNOSIS — D126 Benign neoplasm of colon, unspecified: Secondary | ICD-10-CM | POA: Diagnosis not present

## 2013-01-11 DIAGNOSIS — E1139 Type 2 diabetes mellitus with other diabetic ophthalmic complication: Secondary | ICD-10-CM | POA: Diagnosis not present

## 2013-01-11 DIAGNOSIS — H4310 Vitreous hemorrhage, unspecified eye: Secondary | ICD-10-CM | POA: Diagnosis not present

## 2013-01-11 DIAGNOSIS — E11359 Type 2 diabetes mellitus with proliferative diabetic retinopathy without macular edema: Secondary | ICD-10-CM | POA: Diagnosis not present

## 2013-02-10 ENCOUNTER — Telehealth: Payer: Self-pay | Admitting: Cardiology

## 2013-02-10 NOTE — Telephone Encounter (Signed)
Please call asap-will have to go back to court at 2-having a lot of leg pain-thinks it might be his cholesterol medicine.

## 2013-02-10 NOTE — Telephone Encounter (Signed)
Returned call.  Pt c/o leg pain w/ pravastatin.  C/o pain w/ walking "as much as 2 blocks."  Pt wanted to know what can be done about med b/c he can't tolerate the pain.  Reviewed chart and per last OV note w/ Dr. Ellyn Hack, pt's cholesterol is followed by his PCP.  Advised he contact PCP, Dr. Forde Dandy, with concerns.  Pt verbalized understanding and agreed w/ plan.

## 2013-02-14 DIAGNOSIS — E1139 Type 2 diabetes mellitus with other diabetic ophthalmic complication: Secondary | ICD-10-CM | POA: Diagnosis not present

## 2013-02-14 DIAGNOSIS — E11359 Type 2 diabetes mellitus with proliferative diabetic retinopathy without macular edema: Secondary | ICD-10-CM | POA: Diagnosis not present

## 2013-02-14 DIAGNOSIS — H4310 Vitreous hemorrhage, unspecified eye: Secondary | ICD-10-CM | POA: Diagnosis not present

## 2013-02-14 DIAGNOSIS — H353290 Exudative age-related macular degeneration, unspecified eye, stage unspecified: Secondary | ICD-10-CM | POA: Diagnosis not present

## 2013-02-14 DIAGNOSIS — H4050X0 Glaucoma secondary to other eye disorders, unspecified eye, stage unspecified: Secondary | ICD-10-CM | POA: Diagnosis not present

## 2013-03-03 ENCOUNTER — Encounter: Payer: Self-pay | Admitting: Cardiology

## 2013-03-03 ENCOUNTER — Ambulatory Visit (INDEPENDENT_AMBULATORY_CARE_PROVIDER_SITE_OTHER): Payer: Medicare Other | Admitting: Cardiology

## 2013-03-03 VITALS — BP 124/72 | HR 58 | Ht 69.0 in | Wt 211.9 lb

## 2013-03-03 DIAGNOSIS — E785 Hyperlipidemia, unspecified: Secondary | ICD-10-CM | POA: Diagnosis not present

## 2013-03-03 DIAGNOSIS — I219 Acute myocardial infarction, unspecified: Secondary | ICD-10-CM

## 2013-03-03 DIAGNOSIS — R011 Cardiac murmur, unspecified: Secondary | ICD-10-CM | POA: Diagnosis not present

## 2013-03-03 DIAGNOSIS — I2510 Atherosclerotic heart disease of native coronary artery without angina pectoris: Secondary | ICD-10-CM

## 2013-03-03 DIAGNOSIS — E669 Obesity, unspecified: Secondary | ICD-10-CM

## 2013-03-03 NOTE — Patient Instructions (Signed)
Continue with Crestor if leg pain reoccurin couple months wecan doppler your legs   Your physician wants you to follow-up in 6 months Dr Ellyn Hack.  You will receive a reminder letter in the mail two months in advance. If you don't receive a letter, please call our office to schedule the follow-up appointment.

## 2013-03-03 NOTE — Progress Notes (Signed)
Patient ID: Lance Collier, male   DOB: August 02, 1945, 67 y.o.   MRN: [id number]  PCP: Sheela Stack, MD  Clinic Note: Chief Complaint  Patient presents with  . 3 month visit    no chest pain, some sob, mod edema, having issue leg pain went to pcp not taking imdur or NTG   HPI: Lance Collier is a 67 y.o. male with a PMH below who presents today for followup of his coronary artery disease.  I first met [name] f in the Cath Lab where it joined in with Dr. Aldona Bar during a very complex PCI on a tortuous, calcified Right Coronary Artery. He was actually Dr. Unk Pinto final cardiac catheterization and PCI  -- procedure was performed for worsening exertional dyspnea pressure while walking up and down steps, and stress. He did suffer a type for MI as a result of this, really but hasn't had no further cardiac issues since. His last Myoview was in 2013 was negative for any ischemia or infarction. There was some diaphragmatic attenuation. He had an echo done in March to evaluate his heart murmur which appears to be aortic sclerosis meds as no other source without murmur noted.  Interval History: He actually is doing fine from a cardiac standpoint with no chest pain or shortness of breath rash exertion. Other cardiac symptoms notable lower and relatively normal. He notes some discomfort in his legs both at rest and with walking. It was somewhat improved off of a pravastatin, he is now be started back on a statin as Crestor.  So far he seems like it's not hurting at rest, but he really is not back walking yet. One issue of course is the diabetic retinopathy and has really kept him from doing much living activity. He generally does tend notes less stamina overall, but not anything that would be similar to his pre-cath and PCI symptoms of chest tightness and pressure with dyspnea on exertion. His pacing that is less stamina.  The most concerning thing over last couple months has been recurrence of his  retinal hemorrhaging from diabetic retinopathy. He's really had problems with his right eyes. The baby had a significantly decreased activity level. Her want to do anything. His been getting some Avastin shots which have been helping some.  The remainder of Cardiovascular ROS is as follows: no chest pain or dyspnea on exertion negative for - edema, irregular heartbeat, loss of consciousness, murmur, orthopnea, palpitations, paroxysmal nocturnal dyspnea, rapid heart rate, shortness of breath or  -- but does note decreased exercise tolerance; has not been able to excercise due to DM Retinopathy. He also has peripheral neuropathy symptoms. Additional cardiac review of systems: Lightheadedness - no, dizziness - no, syncope/near-syncope - no; TIA/amaurosis fugax - no Melena - no, hematochezia no; hematuria - no; nosebleeds - no; claudication - no  Past Medical History  Diagnosis Date  . DM (diabetes mellitus) type II uncontrolled with eye manifestation 2013    Diabetic retinopathy with retinal hemorrhages since;, recurrent in August 2014 treated with Avastin shots  . Hypertension   . CAD S/P percutaneous coronary angioplasty 06/12/2011    status post complex PCI to the RCA requiring the use of GuideLiner catheter.  2  . Presence of drug coated stent in right coronary artery 06/12/2011    Subtotal proximal RCA (tortuous) --> complex PCI requiring Guideliner: 2 overlapping Promus element DES stents.  . 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  Likely thromboembolic event with PDA occlusion. 06/12/2011    Very difficult, complex procedure requiring multiple guidewires, guide-liner, etc.  Angiographic evidence of distal thrombo / anthero-embolism as likely etiology of Troponin of ~1.58. Pt remains asymptomatic.   Marland Kitchen NST (non-stress test) nonreactive     Myoview, January 2013: Diaphragmatic attenuation versus infarct in inferior wall, but no ischemia,  and  . Shortness of breath on exertion   . GERD (gastroesophageal reflux disease)   . Anxiety   . 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 EF 55-60% with normal WM; mild concentric hypertrophy with normal relaxation.    Prior Cardiac Evaluation and Past Surgical History: Past Surgical History  Procedure Laterality Date  . Coronary angioplasty with stent placement Right September 2012    2 overlapping Promus DES 2.7 mm at 12 mm x2; postdilated to 3 mm  . Tonsillectomy    . Myoview stress test  January '13    Diaphragmatic attenuation versus inferior infarct. No ischemia. Normal EF normal wall motion.    Allergies  Allergen Reactions  . Atorvastatin Other (See Comments)    Leg pain  . Ibuprofen Hives  . Pravastatin   . Simvastatin Other (See Comments)    Leg pain    Current Outpatient Prescriptions  Medication Sig Dispense Refill  . aspirin EC 81 MG tablet Take 81 mg by mouth daily.        . fenofibrate 160 MG tablet Take 160 mg by mouth daily.      Marland Kitchen HUMALOG KWIKPEN 100 UNIT/ML SOPN Sliding scale      . insulin glargine (LANTUS) 100 UNIT/ML injection Inject 40-50 Units into the skin 2 (two) times daily. 45 units in am, 55 units in pm      . nebivolol (BYSTOLIC) 2.5 MG tablet Take 1 tablet (2.5 mg total) by mouth daily.  30 tablet  12  . pantoprazole (PROTONIX) 40 MG tablet Take 1 tablet (40 mg total) by mouth daily at 6 (six) AM.  30 tablet  6  . rosuvastatin (CRESTOR) 10 MG tablet Take 10 mg by mouth daily. Take 1 tablet twice a week      . valsartan-hydrochlorothiazide (DIOVAN-HCT) 160-12.5 MG per tablet Take 1 tablet by mouth daily.      . isosorbide mononitrate (IMDUR) 30 MG 24 hr tablet Take 1 tablet (30 mg total) by mouth daily.  30 tablet  12  . nitroGLYCERIN (NITROSTAT) 0.4 MG SL tablet Place 1 tablet (0.4 mg total) under the tongue every 5 (five) minutes as needed for chest pain.  25 tablet  3   No current facility-administered  medications for this visit.    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 He notably has not been exercising like he used to. He does walk back and [place]. He has social alcoholic beverages but is trying to cut that back and does not smoke.       He tells me he is a sucker for chips but does not eat sweets because of diabetes.    ROS: A comprehensive Review of Systems - Negative except Pertinent as noted above. Most notably the diabetic retinopathy type symptoms and didn't significantly decreased vision right eye.  He said he was having leg pain with probable call and is now sure if it's going on now Crestor is  not active it then removing it.  PHYSICAL EXAM BP 124/72  Pulse 58  Ht 5\' 9"  (1.753 m)  Wt 211 lb 14.4 oz (96.117 kg)  BMI 31.28 kg/m2 General appearance: alert, cooperative, appears stated age, no distress and A pleasant with normal mood and affect. It is questions appropriately. Mildly obese with truncal obesity. Neck: no adenopathy, no carotid bruit, no JVD, supple, symmetrical, trachea midline and thyroid not enlarged, symmetric, no tenderness/mass/nodules Lungs: clear to auscultation bilaterally, normal percussion bilaterally and Nonlabored, and good air movement Heart: normal apical impulse, regular rate and rhythm, S1, S2 normal, no S3 or S4 and systolic murmur: systolic ejection 1/6, low pitch, crescendo and decrescendo at 2nd left intercostal space, at 2nd right intercostal space, radiates to carotids Abdomen: soft, non-tender; bowel sounds normal; no masses,  no organomegaly and Mildly obese Extremities: extremities normal, atraumatic, no cyanosis or edema Pulses: 2+ and symmetric Neurologic: Grossly normal HEENT: [place]/AT, EOMI, MMM, anicteric sclera  [id number] today: No  Recent Labs: None available, labs were checked by PCP  ASSESSMENT / PLAN: CAD  (coronary artery disease), complex PCI/DES to RCA  No active cardiac symptoms besides the lumen of the decreased stamina. No angina or heart failure symptoms. He is a decent regimen with the beta blocker and ARB. He has not taken Isordil mononitrate now but he says PCI. He was switched from Pravachol to Crestor as noted. He is also on fenofibrate for additional for control. He is no longer on dual antiplatelet therapy, simply just on aspirin alone.  History of Type IVa MI, peak Troponin 1.63 - peri-PCI infarction during complex stenting of torutuos RCA.  Likely thromboembolic event with PDA occlusion. Thankfully, he has not really had that much in the way of an untoward effect from this mild event. It is kept in the hospital one a day. Not unlikely to occur in the setting of a grade distal complex PCI with some dissection as part of a very long procedure. No will evidence of infarction on Myoview or Echocardiogram either.  Dyslipidemia, Zocor intol Currently switched over from pravastatin to Crestor. Not yet sure that this is not help his symptoms or not. We'll continue to monitor him closely to determine if there is any adverse reactions to the current medication. He is doing well now, but has not gotten active full level of activity.  The biggest thing he needs to do is lose weight.  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 Monitor  Obesity (BMI 30-39.9) He knows he is overweight,and needs to lose weight, but with his diabetic retinopathy now he's even more debilitated. I wonder how we can try to fix this problem. Troponin values and weight his HDL will improve.   No orders of the defined types were placed in this encounter.   Meds ordered this encounter  Medications  . rosuvastatin (CRESTOR) 10 MG tablet    Sig: Take 10 mg by mouth daily. Take 1 tablet twice a week    Followup: 6 months  Makenlee Mckeag W. Ellyn Hack, M.D., M.S. [facility] [address] [place], [place]  [address]  [phone] Pager # [phone]

## 2013-03-04 DIAGNOSIS — Z86010 Personal history of colonic polyps: Secondary | ICD-10-CM | POA: Diagnosis not present

## 2013-03-04 DIAGNOSIS — K621 Rectal polyp: Secondary | ICD-10-CM | POA: Diagnosis not present

## 2013-03-04 DIAGNOSIS — Z1211 Encounter for screening for malignant neoplasm of colon: Secondary | ICD-10-CM | POA: Diagnosis not present

## 2013-03-04 DIAGNOSIS — K620 Anal polyp: Secondary | ICD-10-CM | POA: Diagnosis not present

## 2013-03-04 DIAGNOSIS — D126 Benign neoplasm of colon, unspecified: Secondary | ICD-10-CM | POA: Diagnosis not present

## 2013-03-04 DIAGNOSIS — D128 Benign neoplasm of rectum: Secondary | ICD-10-CM | POA: Diagnosis not present

## 2013-03-14 DIAGNOSIS — H4310 Vitreous hemorrhage, unspecified eye: Secondary | ICD-10-CM | POA: Diagnosis not present

## 2013-03-14 DIAGNOSIS — E1139 Type 2 diabetes mellitus with other diabetic ophthalmic complication: Secondary | ICD-10-CM | POA: Diagnosis not present

## 2013-03-14 DIAGNOSIS — H3560 Retinal hemorrhage, unspecified eye: Secondary | ICD-10-CM | POA: Diagnosis not present

## 2013-03-14 DIAGNOSIS — E11311 Type 2 diabetes mellitus with unspecified diabetic retinopathy with macular edema: Secondary | ICD-10-CM | POA: Diagnosis not present

## 2013-03-14 DIAGNOSIS — E11359 Type 2 diabetes mellitus with proliferative diabetic retinopathy without macular edema: Secondary | ICD-10-CM | POA: Diagnosis not present

## 2013-03-14 DIAGNOSIS — H31009 Unspecified chorioretinal scars, unspecified eye: Secondary | ICD-10-CM | POA: Diagnosis not present

## 2013-03-19 ENCOUNTER — Encounter: Payer: Self-pay | Admitting: Cardiology

## 2013-03-19 DIAGNOSIS — E669 Obesity, unspecified: Secondary | ICD-10-CM | POA: Insufficient documentation

## 2013-03-19 NOTE — Assessment & Plan Note (Signed)
He knows he is overweight,and needs to lose weight, but with his diabetic retinopathy now he's even more debilitated. I wonder how we can try to fix this problem. Troponin values and weight his HDL will improve.

## 2013-03-19 NOTE — Assessment & Plan Note (Signed)
Thankfully, he has not really had that much in the way of an untoward effect from this mild event. It is kept in the hospital one a day. Not unlikely to occur in the setting of a grade distal complex PCI with some dissection as part of a very long procedure. No will evidence of infarction on Myoview or Echocardiogram either.

## 2013-03-19 NOTE — Assessment & Plan Note (Addendum)
Currently switched over from pravastatin to Crestor. Not yet sure that this is not help his symptoms or not. We'll continue to monitor him closely to determine if there is any adverse reactions to the current medication. He is doing well now, but has not gotten active full level of activity.  The biggest thing he needs to do is lose weight.

## 2013-03-19 NOTE — Assessment & Plan Note (Addendum)
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 Monitor

## 2013-03-19 NOTE — Assessment & Plan Note (Signed)
No active cardiac symptoms besides the lumen of the decreased stamina. No angina or heart failure symptoms. He is a decent regimen with the beta blocker and ARB. He has not taken Isordil mononitrate now but he says PCI. He was switched from Pravachol to Crestor as noted. He is also on fenofibrate for additional for control. He is no longer on dual antiplatelet therapy, simply just on aspirin alone.

## 2013-03-24 ENCOUNTER — Other Ambulatory Visit: Payer: Self-pay | Admitting: *Deleted

## 2013-03-24 MED ORDER — VALSARTAN-HYDROCHLOROTHIAZIDE 160-12.5 MG PO TABS: 1.0000 | ORAL_TABLET | Freq: Every day | ORAL | Status: DC

## 2013-04-22 DIAGNOSIS — E1139 Type 2 diabetes mellitus with other diabetic ophthalmic complication: Secondary | ICD-10-CM | POA: Diagnosis not present

## 2013-04-22 DIAGNOSIS — E11311 Type 2 diabetes mellitus with unspecified diabetic retinopathy with macular edema: Secondary | ICD-10-CM | POA: Diagnosis not present

## 2013-04-22 DIAGNOSIS — E11359 Type 2 diabetes mellitus with proliferative diabetic retinopathy without macular edema: Secondary | ICD-10-CM | POA: Diagnosis not present

## 2013-04-26 DIAGNOSIS — E1139 Type 2 diabetes mellitus with other diabetic ophthalmic complication: Secondary | ICD-10-CM | POA: Diagnosis not present

## 2013-04-26 DIAGNOSIS — E1142 Type 2 diabetes mellitus with diabetic polyneuropathy: Secondary | ICD-10-CM | POA: Diagnosis not present

## 2013-04-26 DIAGNOSIS — I739 Peripheral vascular disease, unspecified: Secondary | ICD-10-CM | POA: Diagnosis not present

## 2013-04-26 DIAGNOSIS — Z23 Encounter for immunization: Secondary | ICD-10-CM | POA: Diagnosis not present

## 2013-04-26 DIAGNOSIS — N058 Unspecified nephritic syndrome with other morphologic changes: Secondary | ICD-10-CM | POA: Diagnosis not present

## 2013-04-26 DIAGNOSIS — Z1331 Encounter for screening for depression: Secondary | ICD-10-CM | POA: Diagnosis not present

## 2013-04-26 DIAGNOSIS — I10 Essential (primary) hypertension: Secondary | ICD-10-CM | POA: Diagnosis not present

## 2013-04-26 DIAGNOSIS — E785 Hyperlipidemia, unspecified: Secondary | ICD-10-CM | POA: Diagnosis not present

## 2013-04-29 ENCOUNTER — Ambulatory Visit (HOSPITAL_COMMUNITY)
Admission: RE | Admit: 2013-04-29 | Discharge: 2013-04-29 | Disposition: A | Discharge status: Home / Self Care | Payer: Medicare Other | Source: Ambulatory Visit | Attending: Vascular Surgery | Admitting: Vascular Surgery

## 2013-04-29 ENCOUNTER — Other Ambulatory Visit (HOSPITAL_COMMUNITY): Payer: Self-pay | Admitting: Endocrinology

## 2013-04-29 DIAGNOSIS — I739 Peripheral vascular disease, unspecified: Secondary | ICD-10-CM

## 2013-05-02 DIAGNOSIS — E1139 Type 2 diabetes mellitus with other diabetic ophthalmic complication: Secondary | ICD-10-CM | POA: Diagnosis not present

## 2013-05-02 DIAGNOSIS — E11311 Type 2 diabetes mellitus with unspecified diabetic retinopathy with macular edema: Secondary | ICD-10-CM | POA: Diagnosis not present

## 2013-05-02 DIAGNOSIS — H211X9 Other vascular disorders of iris and ciliary body, unspecified eye: Secondary | ICD-10-CM | POA: Diagnosis not present

## 2013-05-02 DIAGNOSIS — H35059 Retinal neovascularization, unspecified, unspecified eye: Secondary | ICD-10-CM | POA: Diagnosis not present

## 2013-05-02 DIAGNOSIS — E11359 Type 2 diabetes mellitus with proliferative diabetic retinopathy without macular edema: Secondary | ICD-10-CM | POA: Diagnosis not present

## 2013-06-21 ENCOUNTER — Other Ambulatory Visit: Payer: Self-pay | Admitting: Cardiology

## 2013-06-21 NOTE — Telephone Encounter (Signed)
Rx was sent to pharmacy electronically. 

## 2013-06-24 ENCOUNTER — Ambulatory Visit
Admission: RE | Admit: 2013-06-24 | Discharge: 2013-06-24 | Disposition: A | Discharge status: Home / Self Care | Payer: Medicare Other | Source: Ambulatory Visit | Attending: Cardiology | Admitting: Cardiology

## 2013-06-24 ENCOUNTER — Telehealth: Payer: Self-pay | Admitting: Cardiology

## 2013-06-24 ENCOUNTER — Ambulatory Visit (INDEPENDENT_AMBULATORY_CARE_PROVIDER_SITE_OTHER): Payer: Medicare Other | Admitting: Cardiology

## 2013-06-24 ENCOUNTER — Encounter: Payer: Self-pay | Admitting: Cardiology

## 2013-06-24 VITALS — BP 110/60 | HR 53 | Ht 69.0 in | Wt 206.9 lb

## 2013-06-24 DIAGNOSIS — Z01818 Encounter for other preprocedural examination: Secondary | ICD-10-CM

## 2013-06-24 DIAGNOSIS — N182 Chronic kidney disease, stage 2 (mild): Secondary | ICD-10-CM | POA: Diagnosis not present

## 2013-06-24 DIAGNOSIS — I200 Unstable angina: Secondary | ICD-10-CM | POA: Diagnosis not present

## 2013-06-24 DIAGNOSIS — D689 Coagulation defect, unspecified: Secondary | ICD-10-CM

## 2013-06-24 DIAGNOSIS — E11359 Type 2 diabetes mellitus with proliferative diabetic retinopathy without macular edema: Secondary | ICD-10-CM | POA: Diagnosis not present

## 2013-06-24 DIAGNOSIS — E1139 Type 2 diabetes mellitus with other diabetic ophthalmic complication: Secondary | ICD-10-CM | POA: Diagnosis not present

## 2013-06-24 DIAGNOSIS — R0609 Other forms of dyspnea: Secondary | ICD-10-CM | POA: Diagnosis not present

## 2013-06-24 DIAGNOSIS — E785 Hyperlipidemia, unspecified: Secondary | ICD-10-CM

## 2013-06-24 DIAGNOSIS — R0989 Other specified symptoms and signs involving the circulatory and respiratory systems: Secondary | ICD-10-CM | POA: Diagnosis not present

## 2013-06-24 DIAGNOSIS — I208 Other forms of angina pectoris: Secondary | ICD-10-CM

## 2013-06-24 DIAGNOSIS — Z79899 Other long term (current) drug therapy: Secondary | ICD-10-CM

## 2013-06-24 DIAGNOSIS — R0600 Dyspnea, unspecified: Secondary | ICD-10-CM

## 2013-06-24 DIAGNOSIS — I209 Angina pectoris, unspecified: Secondary | ICD-10-CM

## 2013-06-24 DIAGNOSIS — I10 Essential (primary) hypertension: Secondary | ICD-10-CM

## 2013-06-24 DIAGNOSIS — I2510 Atherosclerotic heart disease of native coronary artery without angina pectoris: Secondary | ICD-10-CM

## 2013-06-24 LAB — COMPREHENSIVE METABOLIC PANEL
ALK PHOS: 59 U/L (ref 39–117)
ALT: 37 U/L (ref 0–53)
AST: 26 U/L (ref 0–37)
Albumin: 4.1 g/dL (ref 3.5–5.2)
BILIRUBIN TOTAL: 0.4 mg/dL (ref 0.3–1.2)
BUN: 41 mg/dL — ABNORMAL HIGH (ref 6–23)
CO2: 23 mEq/L (ref 19–32)
CREATININE: 2.04 mg/dL — AB (ref 0.50–1.35)
Calcium: 9.3 mg/dL (ref 8.4–10.5)
Chloride: 96 mEq/L (ref 96–112)
Glucose, Bld: 419 mg/dL — ABNORMAL HIGH (ref 70–99)
Potassium: 5.6 mEq/L — ABNORMAL HIGH (ref 3.5–5.3)
Sodium: 125 mEq/L — ABNORMAL LOW (ref 135–145)
Total Protein: 6.9 g/dL (ref 6.0–8.3)

## 2013-06-24 LAB — CBC
HEMATOCRIT: 31.5 % — AB (ref 39.0–52.0)
Hemoglobin: 10.3 g/dL — ABNORMAL LOW (ref 13.0–17.0)
MCH: 27.8 pg (ref 26.0–34.0)
MCHC: 32.7 g/dL (ref 30.0–36.0)
MCV: 84.9 fL (ref 78.0–100.0)
Platelets: 346 10*3/uL (ref 150–400)
RBC: 3.71 MIL/uL — ABNORMAL LOW (ref 4.22–5.81)
RDW: 13.4 % (ref 11.5–15.5)
WBC: 6 10*3/uL (ref 4.0–10.5)

## 2013-06-24 LAB — APTT: aPTT: 33 seconds (ref 24–37)

## 2013-06-24 LAB — PROTIME-INR
INR: 0.94 (ref <1.50)
Prothrombin Time: 12.5 seconds (ref 11.6–15.2)

## 2013-06-24 MED ORDER — ISOSORBIDE MONONITRATE ER 30 MG PO TB24: 30.0000 mg | ORAL_TABLET | Freq: Every day | ORAL | Status: DC

## 2013-06-24 NOTE — Assessment & Plan Note (Signed)
Well controlled 

## 2013-06-24 NOTE — Assessment & Plan Note (Signed)
With retinopathy, nephropathy followed by Dr. Forde Dandy.

## 2013-06-24 NOTE — Progress Notes (Signed)
06/24/2013   PCP: Sheela Stack, MD   Chief Complaint  Patient presents with  . Follow-up    Patient stated he's having SOB and leg pain, patient stated hehad the flu right before christmas and it felt like an elephant sitting on his chest    Primary Cardiologist: Dr. Ellyn Hack  HPI:  68 y.o. male with a PMH below who presents today for eval of his DOE, which is his anginal equivalent with his known coronary artery disease. Dr. Ellyn Hack assisted Dr. Aldona Bar during a very complex PCI on a tortuous, calcified Right Coronary Artery for the pt. In 2012.  He was actually Dr. Unk Pinto final cardiac catheterization and PCI -- procedure was performed for worsening exertional dyspnea pressure while walking up and down steps, and stress. 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  He has done well since without any further cardiac issues since. His last Myoview was in 2014 was negative for any ischemia or infarction minimal diaphragmatic attenuation.. There was some diaphragmatic attenuation. He had an echo done in March to evaluate his heart murmur which appears to be aortic sclerosis meds as no other source without murmur noted.   Since August he has had 2 issues: 1-leg pain with walking 1 block and he must rest.  ABIs were abnormal and he is to see Dr. Oneida Alar for evaluation.  2-increasing DOE.  Noticed it more in August of this year but it continues to progress.  He has trouble walking up steps, very SOB at top.  Now also with walking on the street.  No associated chest pressure or nausea.  It is noted though that this was his symptom prior to the stents.  Additionally when he had the flu a few weeks ago with elevated temp of 102 he had significant heaviness in his chest, though this is completely resolved and he was coughing freq. At the time as well.  He is now having eye issues and related recent symptoms to his opthalmologist and was referred to [place] today.   No current pain or SOB  at rest.     Allergies  Allergen Reactions  . Atorvastatin Other (See Comments)    Leg pain  . Ibuprofen Hives  . Pravastatin   . Simvastatin Other (See Comments)    Leg pain    Current Outpatient Prescriptions  Medication Sig Dispense Refill  . aspirin EC 81 MG tablet Take 81 mg by mouth daily.        . fenofibrate 160 MG tablet Take 160 mg by mouth daily.      Marland Kitchen HUMALOG KWIKPEN 100 UNIT/ML SOPN Sliding scale      . insulin glargine (LANTUS) 100 UNIT/ML injection Inject 40-60 Units into the skin 2 (two) times daily. 45 units in am, 55 units in pm      . nebivolol (BYSTOLIC) 2.5 MG tablet Take 1 tablet (2.5 mg total) by mouth daily.  30 tablet  12  . nitroGLYCERIN (NITROSTAT) 0.4 MG SL tablet Place 1 tablet (0.4 mg total) under the tongue every 5 (five) minutes as needed for chest pain.  25 tablet  3  . pantoprazole (PROTONIX) 40 MG tablet TAKE 1 TABLET ONCE DAILY AT 6:00AM.  30 tablet  9  . rosuvastatin (CRESTOR) 10 MG tablet Take 10 mg by mouth daily. Take 1 tablet twice a week      . valsartan-hydrochlorothiazide (DIOVAN-HCT) 160-12.5 MG per tablet Take 1  tablet by mouth daily.  30 tablet  4  . isosorbide mononitrate (IMDUR) 30 MG 24 hr tablet Take 1 tablet (30 mg total) by mouth daily.  30 tablet  0   No current facility-administered medications for this visit.    Past Medical History  Diagnosis Date  . DM (diabetes mellitus) type II uncontrolled with eye manifestation 2013    Diabetic retinopathy with retinal hemorrhages since;, recurrent in August 2014 treated with Avastin shots  . Hypertension   . CAD S/P percutaneous coronary angioplasty 06/12/2011    status post complex PCI to the RCA requiring the use of GuideLiner catheter.  2  . Presence of drug coated stent in right coronary artery 06/12/2011    Subtotal proximal RCA (tortuous) --> complex PCI requiring Guideliner: 2 overlapping Promus element DES stents.  . Type IVa MI, peak Troponin 1.63 - peri-PCI infarction  during complex stenting of torutuos RCA.  Likely thromboembolic event with PDA occlusion. 06/12/2011    Very difficult, complex procedure requiring multiple guidewires, guide-liner, etc.  Angiographic evidence of distal thrombo / anthero-embolism as likely etiology of Troponin of ~1.58. Pt remains asymptomatic.   Marland Kitchen NST (non-stress test) nonreactive     Myoview, January 2013: Diaphragmatic attenuation versus infarct in inferior wall, but no ischemia, and  . Shortness of breath on exertion   . GERD (gastroesophageal reflux disease)   . Anxiety   . 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 EF 55-60% with normal WM; mild concentric hypertrophy with normal relaxation.  Marland Kitchen PAD (peripheral artery disease)     with claudication    Past Surgical History  Procedure Laterality Date  . Coronary angioplasty with stent placement Right September 2012    2 overlapping Promus DES 2.7 mm at 12 mm x2; postdilated to 3 mm  . Tonsillectomy    . Myoview stress test  January '13    Diaphragmatic attenuation versus inferior infarct. No ischemia. Normal EF normal wall motion.  . Myoview stress test  June 2014    no ischemia    [id number] flu symptoms, though he did receive vaccine, no weight changes Skin:no rashes or ulcers HEENT:+ blurred vision occ , no congestion CV:see HPI PUL:see HPI GI:no diarrhea constipation or melena, no indigestion GU:no hematuria, no dysuria MS:no joint pain, no claudication Neuro:no syncope, no lightheadedness Endo:+ diabetes, no thyroid disease  PHYSICAL EXAM BP 110/60  Pulse 53  Ht 5\' 9"  (1.753 m)  Wt 206 lb 14.4 oz (93.849 kg)  BMI 30.54 kg/m2 General:Pleasant affect, NAD Skin:Warm and dry, brisk capillary refill HEENT:normocephalic, sclera clear, mucus membranes moist Neck:supple, no JVD, no bruits  Heart:S1S2 RRR without murmur, gallup, rub or click Lungs:clear without rales, rhonchi, or wheezes [id number], soft, non  tender, + BS, do not palpate liver spleen or masses Ext:no lower ext edema, 2+ pedal pulses, 2+ radial pulses Neuro:alert and oriented, MAE, follows commands, + facial symmetry [id number] at 53 with early repol in II, III, AVF.  Increased early repol from earlier tracings.  ASSESSMENT AND PLAN DOE (dyspnea on exertion)  DOE increasing like before coronary stent  Chronic kidney disease (CKD), stage II (mild) Will hold Valsartan after Sunday for cath on Tuesday to protect his kidneys.  CAD (coronary artery disease), complex PCI/DES to RCA  Recent negative nuc study in June 2014.  With known CAD will proceed with cardiac cath.  Dr. Ellyn Hack discussed this with him.  HTN (hypertension) Well controlled  Diabetes mellitus, type  2 IDDM With retinopathy, nephropathy followed by Dr. Forde Dandy.   Dyslipidemia, Zocor intol Tolerating Crestor   Dr. Ellyn Hack discussed procedure with the pt.  He will hold his valsartan after Sundays dose.  I added Imdur back to help prevent DOE/angina.  Plan for cath on the 6th with Dr. Ellyn Hack.  He will need IV fluids from time of arrival.  We would like cath around 10:00AM.  Pt agreeable.

## 2013-06-24 NOTE — Assessment & Plan Note (Signed)
Will hold Valsartan after Sunday for cath on Tuesday to protect his kidneys.

## 2013-06-24 NOTE — Telephone Encounter (Signed)
Returning your call Nya .Marland KitchenPlease call back at [phone]   Thanks

## 2013-06-24 NOTE — Assessment & Plan Note (Signed)
Recent negative nuc study in June 2014.  With known CAD will proceed with cardiac cath.  Dr. Ellyn Hack discussed this with him.

## 2013-06-24 NOTE — Patient Instructions (Signed)
  You will be scheduled for a cardiac cath on 06/28/13 to evaluate your coronary arteries.  Do not eat or drink after midnight the night before the test.  Your last dose of valsartan/hctz should be Sunday then hold until we resume after the cath.  I am adding isosorbide to your meds to help decrease the shortness of breath.   You should not drive the day of cath and 3 days after the cath.

## 2013-06-24 NOTE — Assessment & Plan Note (Signed)
Tolerating Crestor 

## 2013-06-24 NOTE — Assessment & Plan Note (Signed)
DOE increasing like before coronary stent

## 2013-06-24 NOTE — Telephone Encounter (Signed)
nya called patient back

## 2013-06-25 LAB — URINALYSIS, MICROSCOPIC ONLY
Bacteria, UA: NONE SEEN
CASTS: NONE SEEN
Crystals: NONE SEEN
Squamous Epithelial / LPF: NONE SEEN

## 2013-06-27 ENCOUNTER — Other Ambulatory Visit: Payer: Self-pay | Admitting: *Deleted

## 2013-06-27 DIAGNOSIS — Z01818 Encounter for other preprocedural examination: Secondary | ICD-10-CM

## 2013-06-28 ENCOUNTER — Other Ambulatory Visit: Payer: Self-pay

## 2013-06-28 ENCOUNTER — Inpatient Hospital Stay (HOSPITAL_COMMUNITY)
Admission: RE | Admit: 2013-06-28 | Discharge: 2013-07-07 | DRG: 234 | Disposition: A | Discharge status: Home / Self Care | Payer: Medicare Other | Source: Ambulatory Visit | Attending: Cardiothoracic Surgery | Admitting: Cardiothoracic Surgery

## 2013-06-28 ENCOUNTER — Encounter (HOSPITAL_COMMUNITY): Payer: Self-pay | Admitting: General Practice

## 2013-06-28 ENCOUNTER — Encounter (HOSPITAL_COMMUNITY)
Admission: RE | Disposition: A | Discharge status: Home / Self Care | Payer: Self-pay | Source: Ambulatory Visit | Attending: Cardiothoracic Surgery

## 2013-06-28 DIAGNOSIS — I517 Cardiomegaly: Secondary | ICD-10-CM | POA: Diagnosis not present

## 2013-06-28 DIAGNOSIS — Z951 Presence of aortocoronary bypass graft: Secondary | ICD-10-CM

## 2013-06-28 DIAGNOSIS — I252 Old myocardial infarction: Secondary | ICD-10-CM | POA: Diagnosis not present

## 2013-06-28 DIAGNOSIS — N183 Chronic kidney disease, stage 3 unspecified: Secondary | ICD-10-CM | POA: Diagnosis not present

## 2013-06-28 DIAGNOSIS — K219 Gastro-esophageal reflux disease without esophagitis: Secondary | ICD-10-CM | POA: Diagnosis present

## 2013-06-28 DIAGNOSIS — I742 Embolism and thrombosis of arteries of the upper extremities: Secondary | ICD-10-CM | POA: Diagnosis not present

## 2013-06-28 DIAGNOSIS — E871 Hypo-osmolality and hyponatremia: Secondary | ICD-10-CM | POA: Diagnosis not present

## 2013-06-28 DIAGNOSIS — E785 Hyperlipidemia, unspecified: Secondary | ICD-10-CM | POA: Diagnosis present

## 2013-06-28 DIAGNOSIS — N182 Chronic kidney disease, stage 2 (mild): Secondary | ICD-10-CM

## 2013-06-28 DIAGNOSIS — D649 Anemia, unspecified: Secondary | ICD-10-CM | POA: Diagnosis not present

## 2013-06-28 DIAGNOSIS — I739 Peripheral vascular disease, unspecified: Secondary | ICD-10-CM | POA: Diagnosis present

## 2013-06-28 DIAGNOSIS — D62 Acute posthemorrhagic anemia: Secondary | ICD-10-CM | POA: Diagnosis not present

## 2013-06-28 DIAGNOSIS — E669 Obesity, unspecified: Secondary | ICD-10-CM | POA: Diagnosis present

## 2013-06-28 DIAGNOSIS — J9819 Other pulmonary collapse: Secondary | ICD-10-CM | POA: Diagnosis not present

## 2013-06-28 DIAGNOSIS — Z6830 Body mass index (BMI) 30.0-30.9, adult: Secondary | ICD-10-CM | POA: Diagnosis not present

## 2013-06-28 DIAGNOSIS — R0602 Shortness of breath: Secondary | ICD-10-CM | POA: Diagnosis not present

## 2013-06-28 DIAGNOSIS — E11319 Type 2 diabetes mellitus with unspecified diabetic retinopathy without macular edema: Secondary | ICD-10-CM | POA: Diagnosis present

## 2013-06-28 DIAGNOSIS — F411 Generalized anxiety disorder: Secondary | ICD-10-CM | POA: Diagnosis present

## 2013-06-28 DIAGNOSIS — Z9861 Coronary angioplasty status: Secondary | ICD-10-CM

## 2013-06-28 DIAGNOSIS — I6529 Occlusion and stenosis of unspecified carotid artery: Secondary | ICD-10-CM | POA: Diagnosis present

## 2013-06-28 DIAGNOSIS — I2510 Atherosclerotic heart disease of native coronary artery without angina pectoris: Principal | ICD-10-CM | POA: Diagnosis present

## 2013-06-28 DIAGNOSIS — I10 Essential (primary) hypertension: Secondary | ICD-10-CM | POA: Diagnosis not present

## 2013-06-28 DIAGNOSIS — I200 Unstable angina: Secondary | ICD-10-CM | POA: Diagnosis present

## 2013-06-28 DIAGNOSIS — Z87891 Personal history of nicotine dependence: Secondary | ICD-10-CM

## 2013-06-28 DIAGNOSIS — Z01818 Encounter for other preprocedural examination: Secondary | ICD-10-CM

## 2013-06-28 DIAGNOSIS — Z7982 Long term (current) use of aspirin: Secondary | ICD-10-CM

## 2013-06-28 DIAGNOSIS — E1139 Type 2 diabetes mellitus with other diabetic ophthalmic complication: Secondary | ICD-10-CM | POA: Diagnosis present

## 2013-06-28 DIAGNOSIS — Z794 Long term (current) use of insulin: Secondary | ICD-10-CM | POA: Diagnosis not present

## 2013-06-28 DIAGNOSIS — I129 Hypertensive chronic kidney disease with stage 1 through stage 4 chronic kidney disease, or unspecified chronic kidney disease: Secondary | ICD-10-CM | POA: Diagnosis present

## 2013-06-28 DIAGNOSIS — I658 Occlusion and stenosis of other precerebral arteries: Secondary | ICD-10-CM | POA: Diagnosis present

## 2013-06-28 DIAGNOSIS — Z01810 Encounter for preprocedural cardiovascular examination: Secondary | ICD-10-CM | POA: Diagnosis not present

## 2013-06-28 DIAGNOSIS — R0989 Other specified symptoms and signs involving the circulatory and respiratory systems: Secondary | ICD-10-CM | POA: Diagnosis not present

## 2013-06-28 HISTORY — DX: Migraine, unspecified, not intractable, without status migrainosus: G43.909

## 2013-06-28 HISTORY — DX: Presence of aortocoronary bypass graft: Z95.1

## 2013-06-28 HISTORY — PX: LEFT HEART CATHETERIZATION WITH CORONARY ANGIOGRAM: SHX5451

## 2013-06-28 HISTORY — DX: Anemia, unspecified: D64.9

## 2013-06-28 HISTORY — PX: CARDIAC CATHETERIZATION: SHX172

## 2013-06-28 HISTORY — DX: Pure hypercholesterolemia, unspecified: E78.00

## 2013-06-28 LAB — GLUCOSE, CAPILLARY
Glucose-Capillary: 247 mg/dL — ABNORMAL HIGH (ref 70–99)
Glucose-Capillary: 257 mg/dL — ABNORMAL HIGH (ref 70–99)

## 2013-06-28 LAB — BASIC METABOLIC PANEL
BUN: 37 mg/dL — AB (ref 6–23)
CALCIUM: 9.4 mg/dL (ref 8.4–10.5)
CO2: 24 meq/L (ref 19–32)
CREATININE: 1.93 mg/dL — AB (ref 0.50–1.35)
Chloride: 98 mEq/L (ref 96–112)
GFR calc Af Amer: 39 mL/min — ABNORMAL LOW (ref >90)
GFR, EST NON AFRICAN AMERICAN: 34 mL/min — AB (ref >90)
GLUCOSE: 195 mg/dL — AB (ref 70–99)
Potassium: 5 mEq/L (ref 3.7–5.3)
Sodium: 135 mEq/L — ABNORMAL LOW (ref 137–147)

## 2013-06-28 SURGERY — LEFT HEART CATHETERIZATION WITH CORONARY ANGIOGRAM
Anesthesia: LOCAL

## 2013-06-28 MED ORDER — ROSUVASTATIN CALCIUM 20 MG PO TABS
20.0000 mg | ORAL_TABLET | Freq: Every day | ORAL | Status: DC
  Administered 2013-06-28 – 2013-07-06 (×8): 20 mg via ORAL
  Filled 2013-06-28 (×10): qty 1

## 2013-06-28 MED ORDER — LIDOCAINE HCL (PF) 1 % IJ SOLN
INTRAMUSCULAR | Status: AC
  Filled 2013-06-28: qty 30

## 2013-06-28 MED ORDER — SODIUM CHLORIDE 0.9 % IV SOLN: 250.0000 mL | INTRAVENOUS | Status: DC | PRN

## 2013-06-28 MED ORDER — ASPIRIN 81 MG PO CHEW
81.0000 mg | CHEWABLE_TABLET | ORAL | Status: DC
  Filled 2013-06-28: qty 1

## 2013-06-28 MED ORDER — NITROGLYCERIN 0.2 MG/ML ON CALL CATH LAB
INTRAVENOUS | Status: AC
  Filled 2013-06-28: qty 1

## 2013-06-28 MED ORDER — SODIUM CHLORIDE 0.9 % IV SOLN: 1.0000 mL/kg/h | INTRAVENOUS | Status: AC

## 2013-06-28 MED ORDER — SODIUM CHLORIDE 0.9 % IV SOLN
INTRAVENOUS | Status: DC
  Administered 2013-06-28: 07:00:00 via INTRAVENOUS

## 2013-06-28 MED ORDER — HEPARIN (PORCINE) IN NACL 100-0.45 UNIT/ML-% IJ SOLN
1200.0000 [IU]/h | INTRAMUSCULAR | Status: DC
  Filled 2013-06-28: qty 250

## 2013-06-28 MED ORDER — HYDRALAZINE HCL 20 MG/ML IJ SOLN
20.0000 mg | Freq: Once | INTRAMUSCULAR | Status: AC
  Administered 2013-06-28: 20 mg via INTRAVENOUS
  Filled 2013-06-28: qty 1

## 2013-06-28 MED ORDER — SODIUM CHLORIDE 0.9 % IJ SOLN: 3.0000 mL | Freq: Two times a day (BID) | INTRAMUSCULAR | Status: DC

## 2013-06-28 MED ORDER — PANTOPRAZOLE SODIUM 40 MG PO TBEC
40.0000 mg | DELAYED_RELEASE_TABLET | Freq: Every day | ORAL | Status: DC
  Administered 2013-06-29 – 2013-06-30 (×2): 40 mg via ORAL
  Filled 2013-06-28 (×2): qty 1

## 2013-06-28 MED ORDER — HEPARIN (PORCINE) IN NACL 2-0.9 UNIT/ML-% IJ SOLN
INTRAMUSCULAR | Status: AC
  Filled 2013-06-28: qty 1000

## 2013-06-28 MED ORDER — MIDAZOLAM HCL 2 MG/2ML IJ SOLN
INTRAMUSCULAR | Status: AC
  Filled 2013-06-28: qty 2

## 2013-06-28 MED ORDER — NITROGLYCERIN 0.4 MG SL SUBL: 0.4000 mg | SUBLINGUAL_TABLET | SUBLINGUAL | Status: DC | PRN

## 2013-06-28 MED ORDER — ONDANSETRON HCL 4 MG/2ML IJ SOLN: 4.0000 mg | Freq: Four times a day (QID) | INTRAMUSCULAR | Status: DC | PRN

## 2013-06-28 MED ORDER — INSULIN ASPART 100 UNIT/ML ~~LOC~~ SOLN
10.0000 [IU] | Freq: Three times a day (TID) | SUBCUTANEOUS | Status: DC
  Administered 2013-06-29 – 2013-06-30 (×6): 10 [IU] via SUBCUTANEOUS
  Filled 2013-06-28 (×21): qty 0.1

## 2013-06-28 MED ORDER — FENOFIBRATE 160 MG PO TABS
160.0000 mg | ORAL_TABLET | Freq: Every day | ORAL | Status: DC
  Administered 2013-06-29 – 2013-07-07 (×8): 160 mg via ORAL
  Filled 2013-06-28 (×9): qty 1

## 2013-06-28 MED ORDER — ASPIRIN EC 81 MG PO TBEC
81.0000 mg | DELAYED_RELEASE_TABLET | Freq: Every day | ORAL | Status: DC
  Administered 2013-06-29 – 2013-06-30 (×2): 81 mg via ORAL
  Filled 2013-06-28 (×3): qty 1

## 2013-06-28 MED ORDER — SODIUM CHLORIDE 0.9 % IJ SOLN: 3.0000 mL | INTRAMUSCULAR | Status: DC | PRN

## 2013-06-28 MED ORDER — INSULIN GLARGINE 100 UNIT/ML ~~LOC~~ SOLN
50.0000 [IU] | Freq: Two times a day (BID) | SUBCUTANEOUS | Status: DC
  Administered 2013-06-28 – 2013-06-30 (×5): 50 [IU] via SUBCUTANEOUS
  Filled 2013-06-28 (×7): qty 0.5

## 2013-06-28 MED ORDER — MORPHINE SULFATE 2 MG/ML IJ SOLN: 2.0000 mg | INTRAMUSCULAR | Status: DC | PRN

## 2013-06-28 MED ORDER — ISOSORBIDE MONONITRATE ER 30 MG PO TB24
30.0000 mg | ORAL_TABLET | Freq: Every day | ORAL | Status: DC
  Administered 2013-06-29 – 2013-06-30 (×2): 30 mg via ORAL
  Filled 2013-06-28 (×3): qty 1

## 2013-06-28 MED ORDER — SODIUM CHLORIDE 0.9 % IJ SOLN
3.0000 mL | Freq: Two times a day (BID) | INTRAMUSCULAR | Status: DC
  Administered 2013-06-29 – 2013-06-30 (×2): 3 mL via INTRAVENOUS

## 2013-06-28 MED ORDER — DIAZEPAM 5 MG PO TABS
5.0000 mg | ORAL_TABLET | ORAL | Status: DC
  Filled 2013-06-28: qty 1

## 2013-06-28 MED ORDER — HEPARIN (PORCINE) IN NACL 100-0.45 UNIT/ML-% IJ SOLN
1500.0000 [IU]/h | INTRAMUSCULAR | Status: DC
  Administered 2013-06-28: 1200 [IU]/h via INTRAVENOUS
  Administered 2013-06-29 – 2013-06-30 (×2): 1500 [IU]/h via INTRAVENOUS
  Filled 2013-06-28 (×5): qty 250

## 2013-06-28 MED ORDER — FENTANYL CITRATE 0.05 MG/ML IJ SOLN
INTRAMUSCULAR | Status: AC
  Filled 2013-06-28: qty 2

## 2013-06-28 MED ORDER — ACETAMINOPHEN 325 MG PO TABS: 650.0000 mg | ORAL_TABLET | ORAL | Status: DC | PRN

## 2013-06-28 MED ORDER — NEBIVOLOL HCL 2.5 MG PO TABS
2.5000 mg | ORAL_TABLET | Freq: Every day | ORAL | Status: DC
  Administered 2013-06-29 – 2013-06-30 (×2): 2.5 mg via ORAL
  Filled 2013-06-28 (×3): qty 1

## 2013-06-28 NOTE — CV Procedure (Signed)
CARDIAC CATHETERIZATION REPORT  NAME:  Lance Collier   MRN: [id number] DOB:  Oct 29, 1945   ADMIT DATE: 06/28/2013 Procedure Date: 06/28/2013  INTERVENTIONAL CARDIOLOGIST: Leonie Man, M.D., MS PRIMARY CARE PROVIDER: Sheela Stack, MD PRIMARY CARDIOLOGIST: Leonie Man, MD, MS  PATIENT:  Lance Collier is a 68 y.o. male who is a former patient of Dr. Aldona Bar with a significant history of Extensive PCI to the RCA in November 2012 by Dr. Rex Kras. Denies any symptoms exertional dyspnea. He is significant/brittle insulin-dependent diabetes as well as hypertension and hyperlipidemia. He did not have any angina type chest pain, simply had exertional dyspnea. At that time was found to have a 95% subtotal occluded RCA. He has done well since his PCI with exception of having some mild claudication symptoms. However over last week or 2 he is noted worsening exertional dyspnea now able to walk about one block. He was seen by his ophthalmologist and referred for urgent add-on visit last week with Cecilie Kicks, NP. I actually evaluated him in the clinic along with Mickel Baas and agreed that the best plan of action would be to proceed to cardiac catheterization for what sounds like crescendo/unstable symptoms with his anginal equivalent being dyspnea with minimal pressure in his chest.  Of note, he has baseline creatinine of 1.66, however his precath labs were showing a creatinine of 2.0 with potassium 5.6. He dose was held and he was admitted for early morning initiation of hydration. Creatinine was 1.9, therefore femoral approach we chosen and minimal contrast is to be used.  PRE-OPERATIVE DIAGNOSIS:    CLASS IV EXERTIONAL DYSPNEA (ANGINAL EQUIVALENT)  KNOWN CAD - PCI TO RCA  PROCEDURES PERFORMED:    LEFT HEART CATHETERIZATION WITH NATIVE CORONARY ANGIOGRAPHY  PROCEDURE:Consent:  Risks of procedure as well as the alternatives and risks of each were explained to the (patient/caregiver).   Consent for procedure obtained. Consent for signed by MD and patient with RN witness -- placed on chart.   PROCEDURE: The patient was brought to the [facility] in the fasting state and prepped and draped in the usual sterile fashion for Right groin or radial access.  FEMORAL ACCESS WAS USED TO MINIMIZE CONTRAST.  Sterile technique was used including antiseptics, cap, gloves, gown, hand hygiene, mask and sheet.  Skin prep: Chlorhexidine.  Time Out: Verified patient identification, verified procedure, site/side was marked, verified correct patient position, special equipment/implants available, medications/allergies/relevent history reviewed, required imaging and test results available.  Performed  Access: RIGHT COMMON FEMORAL Artery; 5 Fr Sheath -- Fluoroscopically guided guided modified Seldinger technique (Angiocath Micropuncture Kit)  Diagnostic: 5Fr JL 4 and JR 4 catheters advanced & exchanged over a standard J wire.   Left Coronary Artery Angiography: JL4  Right Coronary Artery Angiography: JR4  LV Hemodynamics (LV Gram): JR4  Sheath:  Upsized to 6 Fr with initial plan to perform FFR.  Removed in Holding area with manual pressure for hemostasis.  MEDICATIONS:  Anesthesia:  Local Lidocaine 18 ml  Sedation:  2 mg IV Versed, 50 mcg IV fentanyl ;   Omnipaque Contrast: 70 ml  Hemodynamics:  Central Aortic / Mean Pressures: 155/63 mmHg; 98 mmHg  Left Ventricular Pressures / EDP: 157/9 mmHg; 14 mmHg  Left Ventriculography:  EF: 55-60 %  Wall Motion: Normal  Coronary Anatomy:  Left Main: Ostial ~50-70% stenosis (damping of pressure with 5 Fr catheter engagement & no blowback into Aorta. LAD: Moderate large-caliber vessel extends around the apex the  heart. There is mild luminal irregularities throughout with a 20% mid lesion that looks to be intramyocardial.  D1: Small-caliber vessel with ostial 60% stenosis.  D2: Small-caliber vessel,  angiographically normal Left Circumflex: Large-caliber vessel that gives off a very proximal Ramus-like OM1 that is somewhat tortuous in its course. The circumflex complex and continued down to the AV groove where it gives off a very small marginal branch before terminating as a moderate large-caliber OM 2 then bifurcates distally. Is a very small AV groove circumflex comes off at at the same site as a smaller marginal branch.    OM1: Large-caliber ramus-like vessel that reaches down to the inferior apex. It bifurcates distally with minimal luminal irregularities proximally.  OM 2: Moderate caliber vessel which bifurcates into 2 small-moderate caliber marginal branches.   RCA: Moderate caliber tortuous vessel with patent stents in the proximal and mid segment. There remains a slight hazy opacity in the second bend within the stent. There is otherwise diffuse luminal irregularities with a roughly 40-50% stenosis before bifurcates into the PDA and the Right Posterior AV Groove Branch (RPAV)  RPDA: Moderate caliber vessel that reaches the apex. Minimal luminal irregularities. Tapers again.  RPL Sysytem:The RPAV begins a moderate caliber vessel bifurcates into 2 small caliber PL branches to  PATIENT DISPOSITION:    The patient was transferred to the PACU holding area in a hemodynamicaly stable, chest pain free condition.  The patient tolerated the procedure well, and there were no complications.  EBL:   < 10 ml  The patient was stable before, during, and after the procedure.  POST-OPERATIVE DIAGNOSIS:    Severe 50-70% ostial left main stenosis with excellent downstream target vessels in the left system.  Moderate hazy in-stent lesion in the mid RCA  Preserved LVEF and normal LVEDP  PLAN OF CARE:  Admit to [facility] unit. I have called CVTS consultation for CABG  Start IV heparin 6 hours post sheath removal.  Continue home medications including diabetic regimen.   Leonie Man, M.D.,  [facility] [address] [place], [place]  [address]  [phone]  06/28/2013 3:22 PM

## 2013-06-28 NOTE — Consult Note (Signed)
[address]       [place] [address]             [phone]        Dell W [name] [facility] Medical Record #[id number] Date of Birth: [birth year]  Referring: No ref. provider found Primary Care: Sheela Stack, MD  Chief Complaint:  CAD  History of Present Illness:  The patient is a 68 year old male with history of coronary artery disease dating back to 2012 where he had previously undergone coronary angiography and stent placement of the right coronary artery.  This was an extensive complex  lesion. He has done well with only  symptoms of mild claudication however recently has developed increasing exertional dyspnea. He was seen last week by his ophthalmologist and due to these symptoms seen urgently by cardiology in the office. Cardiology then scheduled him for catheterization which was done today. Cardiology does feel that his anginal equivalent is dyspnea on exertion with only minimal pressure in his chest. Findings included severe 50-70% ostial left main stenosis. He also has moderate hazy in-  stent lesion in the mid RCA with preserved left ventricular ejection fraction and normal left ventricular end-diastolic pressure. We have been asked to consult regarding consideration of coronary artery bypass grafting. The patient was noted to have some baseline renal insufficiency. Most recent creatinine dated 06/24/2002 was 2.04. He apparently was scheduled to see Dr. Oneida Alar in vascular surgery at some point for evaluation of his claudication symptoms and  abnormal ABIs. He had the flu just prior to Christmas but has recovered well.    Current Activity/ Functional Status: Patient is independent with mobility/ambulation, transfers, ADL's, IADL's.   Zubrod Score: At the time of surgery this patient's most appropriate activity status/level should be described as: '[]'  Normal activity, no symptoms '[x]'  Symptoms, fully ambulatory '[]'  Symptoms, in bed less than or equal to  50% of the time '[]'  Symptoms, in bed greater than 50% of the time but less than 100% '[]'  Bedridden '[]'  Moribund  Past Medical History  Diagnosis Date  . DM (diabetes mellitus) type II uncontrolled with eye manifestation 2013    Diabetic retinopathy with retinal hemorrhages since;, recurrent in August 2014 treated with Avastin shots  . Hypertension   . CAD S/P percutaneous coronary angioplasty 06/12/2011    status post complex PCI to the RCA requiring the use of GuideLiner catheter.  2  . Presence of drug coated stent in right coronary artery 06/12/2011    Subtotal proximal RCA (tortuous) --> complex PCI requiring Guideliner: 2 overlapping Promus element DES stents.  . 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  Likely thromboembolic event with PDA occlusion. 06/12/2011    Very difficult, complex procedure requiring multiple guidewires, guide-liner, etc.  Angiographic evidence of distal thrombo / anthero-embolism as likely etiology of Troponin of ~1.58. Pt remains asymptomatic.   Marland Kitchen NST (non-stress test) nonreactive     Myoview, January 2013: Diaphragmatic attenuation versus infarct in inferior wall, but no ischemia, and  . Shortness of breath on exertion   . GERD (gastroesophageal reflux disease)   . Anxiety   . 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 EF 55-60% with normal WM; mild concentric hypertrophy with normal relaxation.  Marland Kitchen PAD (peripheral artery disease)     with claudication    episode od acute confusion 10 years ago with extensive workup and no diagnosis  Past Surgical History  Procedure Laterality Date  . Coronary angioplasty with stent placement Right September 2012    2 overlapping Promus DES 2.7 mm at 12 mm x2; postdilated to 3 mm  . Tonsillectomy    . Myoview stress test  January '13    Diaphragmatic attenuation versus inferior infarct. No ischemia. Normal EF normal wall motion.  .  Myoview stress test  June 2014    no ischemia    History  Smoking status  . Former Smoker -- 2.00 packs/day  . Types: Cigarettes  . Quit date: 11/23/1996  Smokeless tobacco  . Never Used    History  Alcohol Use  . 2.2 oz/week  . 2 Glasses of wine, 0 Cans of beer, 0 Shots of liquor, 2 Drinks containing 0.5 oz of alcohol per week    History   Social History  . Marital Status: married    Spouse Name: N/A    Number of Children: N/A  . Years of Education: Law school   Occupational History  . lawyer   Social History Main Topics  . Smoking status: Former Smoker -- 2.00 packs/day    Types: Cigarettes    Quit date: 11/23/1996  . Smokeless tobacco: Never Used  . Alcohol Use: 2.2 oz/week    2 Glasses of wine, 0 Cans of beer, 0 Shots of liquor, 2 Drinks containing 0.5 oz of alcohol per week  . Drug Use: No  . Sexual Activity: Not on file   Other Topics Concern  . 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 He notably has not been exercising like he used to. He does walk back and [place]. He has social alcoholic beverages but is trying to cut that back and does not smoke.       He tells me he is a sucker for chips but does not eat sweets because of diabetes.    Allergies  Allergen Reactions  . Atorvastatin Other (See Comments)    Leg pain  . Ibuprofen Hives  . Pravastatin   . Simvastatin Other (See Comments)    Leg pain    Current Facility-Administered Medications  Medication Dose Route Frequency Provider Last Rate Last Dose  . [START ON 06/29/2013] 0.9 %  sodium chloride infusion   Intravenous Continuous Leonie Man, MD 75 mL/hr at 06/28/13 0725    . 0.9 %  sodium chloride infusion  1 mL/kg/hr Intravenous Continuous Leonie Man, MD      . Derrill Memo ON 06/29/2013] aspirin chewable tablet 81 mg  81 mg Oral Pre-Cath Leonie Man, MD      . diazepam  (VALIUM) tablet 5 mg  5 mg Oral On Call Leonie Man, MD      . sodium chloride 0.9 % injection 3 mL  3 mL Intravenous Q12H Leonie Man, MD        Prescriptions prior to admission  Medication Sig Dispense Refill  . aspirin EC 81 MG tablet Take 81 mg by mouth daily.        . fenofibrate 160 MG tablet Take 160 mg by mouth daily.      Marland Kitchen HUMALOG KWIKPEN 100 UNIT/ML SOPN Inject 10-15 Units into the skin 3 (three) times daily. Sliding scale. 10 units with breakfast and 15 units with lunch and supper      . insulin glargine (LANTUS)  100 UNIT/ML injection Inject 40-60 Units into the skin 2 (two) times daily. 40 units in am, 60 units in pm      . isosorbide mononitrate (IMDUR) 30 MG 24 hr tablet Take 1 tablet (30 mg total) by mouth daily.  30 tablet  0  . nebivolol (BYSTOLIC) 2.5 MG tablet Take 1 tablet (2.5 mg total) by mouth daily.  30 tablet  12  . pantoprazole (PROTONIX) 40 MG tablet TAKE 1 TABLET ONCE DAILY AT 6:00AM.  30 tablet  9  . Polyethyl Glycol-Propyl Glycol (SYSTANE OP) Apply 1 drop to eye daily as needed (for dry eyes after seeing eye doctor).      . rosuvastatin (CRESTOR) 10 MG tablet Take 10 mg by mouth 2 (two) times a week. Take 1 tablet twice a week      . valsartan-hydrochlorothiazide (DIOVAN-HCT) 160-12.5 MG per tablet Take 1 tablet by mouth daily.  30 tablet  4  . nitroGLYCERIN (NITROSTAT) 0.4 MG SL tablet Place 1 tablet (0.4 mg total) under the tongue every 5 (five) minutes as needed for chest pain.  25 tablet  3    Family History  Problem Relation Age of Onset  . Cancer Sister     Lung cancer  . Cancer Mother   . Cancer Father   . Cancer Maternal Grandmother   . Cancer Paternal Grandfather      Review of Systems:     Cardiac Review of Systems: Y or N  Chest Pain [  y  ]  Resting SOB [ n  ] Exertional SOB  Blue.Reese  ]  Orthopnea [  ]   Pedal Edema [ y  ]    Palpitations [n  ] Syncope  [  ]   Presyncope [   ]  General Review of Systems: [Y] = yes [  ]=no Constitional:  recent weight change [  ]; anorexia Blue.Reese  ]; fatigue [  ]; nausea [  ]; night sweats [  ]; fever [  ]; or chills [  ]                                                               Dental: poor dentition[  ]; Last Dentist visit:   Eye : blurred vision Blue.Reese  ]; diplopia Blue.Reese   ]; vision changes [  ];  Amaurosis fugax[  ]; Resp: cough [  ];  wheezing[  ];  hemoptysis[  ]; shortness of breath[ y ]; paroxysmal nocturnal dyspnea[  ]; dyspnea on exertion[y  ]; or orthopnea[  ];  GI:  gallstones[  ], vomiting[  ];  dysphagia[  ]; melena[  ];  hematochezia [  ]; heartburn[  ];   Hx of  Colonoscopy[ y ]; GU: kidney stones [  ]; hematuria[  ];   dysuria [  ];  nocturia[  ];  history of     obstruction [  ]; urinary frequency [  ]             Skin: rash, swelling[  ];, hair loss[  ];  peripheral edema[y  ];  or itching[  ]; Musculosketetal: myalgias[  ];  joint swelling[  ];  joint erythema[  ];  joint pain[y  ];  back pain[  ];knee pain  Heme/Lymph: bruising[  ];  bleeding[  ];  anemia[  ];  Neuro: TIA[  ];  headaches[  ];  stroke[  ];  vertigo[  ];  seizures[  ];   paresthesias[  ];  difficulty walking[  ];  Psych:depression[y  ]; Sylvester Harder  ];  Endocrine: diabetes[y  ];  thyroid dysfunction[  ];  Immunizations: Flu [  ]; Pneumococcal[  ];  Other:  Physical Exam: BP 152/49  Pulse 77  Temp(Src) 97.3 F (36.3 C) (Oral)  Resp 17  Ht '5\' 9"'  (1.753 m)  Wt 205 lb (92.987 kg)  BMI 30.26 kg/m2  SpO2 97%  Physical Exam  Constitutional: He is oriented to person, place, and time. He appears well-developed and well-nourished. No distress.  HENT:  Head: Normocephalic and atraumatic.  Mouth/Throat: Oropharynx is clear and moist. No oropharyngeal exudate.  Eyes: Conjunctivae are normal. Pupils are equal, round, and reactive to light. Right eye exhibits no discharge. Left eye exhibits no discharge. No scleral icterus.  Neck: Normal range of motion. Neck supple. No JVD present. No tracheal deviation present.   Cardiovascular: Normal rate, regular rhythm and normal heart sounds.  Exam reveals no gallop and no friction rub.   No murmur heard. Pulmonary/Chest: Effort normal and breath sounds normal. He has no rales. He exhibits no tenderness.  Abdominal: He exhibits distension. He exhibits no mass. There is no tenderness. There is no rebound and no guarding.  Musculoskeletal: He exhibits no edema and no tenderness.  Lymphadenopathy:    He has no cervical adenopathy.  Neurological: He is alert and oriented to person, place, and time.  Skin: Skin is warm and dry. No rash noted. No erythema.  Psychiatric: He has a normal mood and affect.  addendum : + ventral hernia vs diastasis    + DP pulse on right, absent pedal pulses otherwise   + carotid bruits bilaterial Diagnostic Studies & Laboratory data:     Recent Radiology Findings:  Dg Chest 2 View  06/24/2013   CLINICAL DATA:  Dyspnea. Hypertension and diabetes. Preop respiratory exam for cardiac catheterization.  EXAM: CHEST  2 VIEW  COMPARISON:  06/06/2011  FINDINGS: The heart size and mediastinal contours are within normal limits. Both lungs are clear. The visualized skeletal structures are unremarkable.  IMPRESSION: No active cardiopulmonary disease.   Electronically Signed   By: Earle Gell M.D.   On: 06/24/2013 15:09     Recent Lab Findings: Lab Results  Component Value Date   WBC 6.0 06/24/2013   HGB 10.3* 06/24/2013   HCT 31.5* 06/24/2013   PLT 346 06/24/2013   GLUCOSE 195* 06/28/2013   ALT 37 06/24/2013   AST 26 06/24/2013   NA 135* 06/28/2013   K 5.0 06/28/2013   CL 98 06/28/2013   CREATININE 1.93* 06/28/2013   BUN 37* 06/28/2013   CO2 24 06/28/2013   INR 0.94 06/24/2013   Chronic Kidney Disease   Stage I     GFR >90  Stage II    GFR 60-89  Stage IIIA GFR 45-59  Stage IIIB GFR 30-44  Stage IV   GFR 15-29  Stage V    GFR  <15  Lab Results  Component Value Date   CREATININE 1.93* 06/28/2013   Estimated Creatinine Clearance: 40.7 ml/min (by C-G formula  based on Cr of 1.93).  CARDIAC CATHETERIZATION REPORT  NAME: Jacy W [name] MRN: [id number]  DOB: 1946-03-06 ADMIT DATE: 06/28/2013  Procedure Date: 06/28/2013  INTERVENTIONAL CARDIOLOGIST: Leonie Man, M.D.,  MS  PRIMARY CARE PROVIDER: Sheela Stack, MD  PRIMARY CARDIOLOGIST: Leonie Man, MD, MS  PATIENT: Avery W [name] is a 68 y.o. male who is a former patient of Dr. Aldona Bar with a significant history of  Extensive PCI to the RCA in November 2012 by Dr. Rex Kras. Denies any symptoms exertional dyspnea. He is significant/brittle insulin-dependent diabetes as well as hypertension and hyperlipidemia. He did not have any angina type chest pain, simply had exertional dyspnea. At that time was found to have a 95% subtotal occluded RCA. He has done well since his PCI with exception of having some mild claudication symptoms. However over last week or 2 he is noted worsening exertional dyspnea now able to walk about one block.  He was seen by his ophthalmologist and referred for urgent add-on visit last week with Cecilie Kicks, NP. I actually evaluated him in the clinic along with Mickel Baas and agreed that the best plan of action would be to proceed to cardiac catheterization for what sounds like crescendo/unstable symptoms with his anginal equivalent being dyspnea with minimal pressure in his chest.  Of note, he has baseline creatinine of 1.66, however his precath labs were showing a creatinine of 2.0 with potassium 5.6. He dose was held and he was admitted for early morning initiation of hydration. Creatinine was 1.9, therefore femoral approach we chosen and minimal contrast is to be used.  PRE-OPERATIVE DIAGNOSIS:  CLASS IV EXERTIONAL DYSPNEA (ANGINAL EQUIVALENT)  KNOWN CAD - PCI TO RCA PROCEDURES PERFORMED:  LEFT HEART CATHETERIZATION WITH NATIVE CORONARY ANGIOGRAPHY PROCEDURE:Consent: Risks of procedure as well as the alternatives and risks of each were explained to the (patient/caregiver).  Consent for procedure obtained.  Consent for signed by MD and patient with RN witness -- placed on chart.  PROCEDURE: The patient was brought to the [facility] in the fasting state and prepped and draped in the usual sterile fashion for Right groin or radial access. FEMORAL ACCESS WAS USED TO MINIMIZE CONTRAST. Sterile technique was used including antiseptics, cap, gloves, gown, hand hygiene, mask and sheet. Skin prep: Chlorhexidine.  Time Out: Verified patient identification, verified procedure, site/side was marked, verified correct patient position, special equipment/implants available, medications/allergies/relevent history reviewed, required imaging and test results available. Performed  Access: RIGHT COMMON FEMORAL Artery; 5 Fr Sheath -- Fluoroscopically guided guided modified Seldinger technique (Angiocath Micropuncture Kit)  Diagnostic: 5Fr JL 4 and JR 4 catheters advanced & exchanged over a standard J wire.  Left Coronary Artery Angiography: JL4  Right Coronary Artery Angiography: JR4  LV Hemodynamics (LV Gram): JR4 Sheath: Upsized to 6 Fr with initial plan to perform FFR. Removed in Holding area with manual pressure for hemostasis.  MEDICATIONS:  Anesthesia: Local Lidocaine 18 ml Sedation: 2 mg IV Versed, 50 mcg IV fentanyl ;  Omnipaque Contrast: 70 ml Hemodynamics:  Central Aortic / Mean Pressures: 155/63 mmHg; 98 mmHg  Left Ventricular Pressures / EDP: 157/9 mmHg; 14 mmHg Left Ventriculography:  EF: 55-60 %  Wall Motion: Normal Coronary Anatomy:  Left Main: Ostial ~50-70% stenosis (damping of pressure with 5 Fr catheter engagement & no blowback into Aorta. LAD: Moderate large-caliber vessel extends around the apex the heart. There is mild luminal irregularities throughout with a 20% mid lesion that looks to be intramyocardial.  D1: Small-caliber vessel with ostial 60% stenosis.  D2: Small-caliber vessel, angiographically normal Left  Circumflex: Large-caliber vessel that gives off a very proximal Ramus-like OM1 that is somewhat tortuous in its course. The  circumflex complex and continued down to the AV groove where it gives off a very small marginal branch before terminating as a moderate large-caliber OM 2 then bifurcates distally. Is a very small AV groove circumflex comes off at at the same site as a smaller marginal branch.  OM1: Large-caliber ramus-like vessel that reaches down to the inferior apex. It bifurcates distally with minimal luminal irregularities proximally.  OM 2: Moderate caliber vessel which bifurcates into 2 small-moderate caliber marginal branches. RCA: Moderate caliber tortuous vessel with patent stents in the proximal and mid segment. There remains a slight hazy opacity in the second bend within the stent. There is otherwise diffuse luminal irregularities with a roughly 40-50% stenosis before bifurcates into the PDA and the Right Posterior AV Groove Branch (RPAV)  RPDA: Moderate caliber vessel that reaches the apex. Minimal luminal irregularities. Tapers again.  RPL Sysytem:The RPAV begins a moderate caliber vessel bifurcates into 2 small caliber PL branches to PATIENT DISPOSITION:  The patient was transferred to the PACU holding area in a hemodynamicaly stable, chest pain free condition.  The patient tolerated the procedure well, and there were no complications. EBL: < 10 ml  The patient was stable before, during, and after the procedure. POST-OPERATIVE DIAGNOSIS:  Severe 50-70% ostial left main stenosis with excellent downstream target vessels in the left system.  Moderate hazy in-stent lesion in the mid RCA  Preserved LVEF and normal LVEDP PLAN OF CARE:  Admit to [facility] unit. I have called CVTS consultation for CABG  Start IV heparin 6 hours post sheath removal.  Continue home medications including diabetic regimen. Leonie Man, M.D., M.S.  [facility]  [address]  [place], [place] [address]  [phone]  06/28/2013  3:22 PM   Assessment / Plan:   63- 70% ostial  Lmain/RCA CAD with dyspnea on exertion Diabetes Mellitus  with complications including retinal , renal disease, cerebral vascular diease and peripheral vascular diaese  Chronic Kidney Disease Stage IIIB GFR 30-44       Recommend CABG as best revasc option       Monitor renal fxn prior to surgery Follow up carotid doppler study for carotid bruits Echocardiogram Poss CABG Friday         have [facility]  Dr Oneida Alar to see prior to OR regarding claudication /carotid disease  Grace Isaac MD      [address] [place] [address] Office [phone]   Beeper [phone]

## 2013-06-28 NOTE — Progress Notes (Signed)
Cr 1.93, Dr. Ellyn Hack notified, instructed to continue IV fluids.  Will continue to monitor.

## 2013-06-28 NOTE — Research (Signed)
Avert Informed Consent   Subject Name: Lance Collier  Subject met inclusion and exclusion criteria.  The informed consent form, study requirements and expectations were reviewed with the subject and questions and concerns were addressed prior to the signing of the consent form.  The subject verbalized understanding of the trail requirements.  The subject agreed to participate in theAvert trial and signed the informed consent.  The informed consent was obtained prior to performance of any protocol-specific procedures for the subject.  A copy of the signed informed consent was given to the subject and a copy was placed in the subject's medical record.  Sandie Ano 06/28/2013, 7:20

## 2013-06-28 NOTE — Interval H&P Note (Signed)
History and Physical Interval Note:  06/28/2013 9:24 AM  Lance Collier  has presented today for surgery, with the diagnosis of Chest pain - Unstable Angina  The various methods of treatment have been discussed with the patient and family. After consideration of risks, benefits and other options for treatment, the patient has consented to  Procedure(s): LEFT HEART CATHETERIZATION WITH CORONARY ANGIOGRAM (N/A) /- PCI as a surgical intervention .  The patient's history has been reviewed, patient examined, no change in status, stable for surgery.  I have reviewed the patient's chart and labs.  Questions were answered to the patient's satisfaction.     HARDING,DAVID W  Cath Lab Visit (complete for each Cath Lab visit)  Clinical Evaluation Leading to the Procedure:   ACS: no  Non-ACS:    Anginal Classification: CCS III  Anti-ischemic medical therapy: Maximal Therapy (2 or more classes of medications)  Non-Invasive Test Results: No non-invasive testing performed  Prior CABG: No previous CABG

## 2013-06-28 NOTE — H&P (View-Only) (Signed)
06/24/2013   PCP: Sheela Stack, MD   Chief Complaint  Patient presents with  . Follow-up    Patient stated he's having SOB and leg pain, patient stated hehad the flu right before christmas and it felt like an elephant sitting on his chest    Primary Cardiologist: Dr. Ellyn Hack  HPI:  68 y.o. male with a PMH below who presents today for eval of his DOE, which is his anginal equivalent with his known coronary artery disease. Dr. Ellyn Hack assisted Dr. Aldona Bar during a very complex PCI on a tortuous, calcified Right Coronary Artery for the pt. In 2012.  He was actually Dr. Unk Pinto final cardiac catheterization and PCI -- procedure was performed for worsening exertional dyspnea pressure while walking up and down steps, and stress. 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  He has done well since without any further cardiac issues since. His last Myoview was in 2014 was negative for any ischemia or infarction minimal diaphragmatic attenuation.. There was some diaphragmatic attenuation. He had an echo done in March to evaluate his heart murmur which appears to be aortic sclerosis meds as no other source without murmur noted.   Since August he has had 2 issues: 1-leg pain with walking 1 block and he must rest.  ABIs were abnormal and he is to see Dr. Oneida Alar for evaluation.  2-increasing DOE.  Noticed it more in August of this year but it continues to progress.  He has trouble walking up steps, very SOB at top.  Now also with walking on the street.  No associated chest pressure or nausea.  It is noted though that this was his symptom prior to the stents.  Additionally when he had the flu a few weeks ago with elevated temp of 102 he had significant heaviness in his chest, though this is completely resolved and he was coughing freq. At the time as well.  He is now having eye issues and related recent symptoms to his opthalmologist and was referred to [place] today.   No current pain or SOB  at rest.     Allergies  Allergen Reactions  . Atorvastatin Other (See Comments)    Leg pain  . Ibuprofen Hives  . Pravastatin   . Simvastatin Other (See Comments)    Leg pain    Current Outpatient Prescriptions  Medication Sig Dispense Refill  . aspirin EC 81 MG tablet Take 81 mg by mouth daily.        . fenofibrate 160 MG tablet Take 160 mg by mouth daily.      Marland Kitchen HUMALOG KWIKPEN 100 UNIT/ML SOPN Sliding scale      . insulin glargine (LANTUS) 100 UNIT/ML injection Inject 40-60 Units into the skin 2 (two) times daily. 45 units in am, 55 units in pm      . nebivolol (BYSTOLIC) 2.5 MG tablet Take 1 tablet (2.5 mg total) by mouth daily.  30 tablet  12  . nitroGLYCERIN (NITROSTAT) 0.4 MG SL tablet Place 1 tablet (0.4 mg total) under the tongue every 5 (five) minutes as needed for chest pain.  25 tablet  3  . pantoprazole (PROTONIX) 40 MG tablet TAKE 1 TABLET ONCE DAILY AT 6:00AM.  30 tablet  9  . rosuvastatin (CRESTOR) 10 MG tablet Take 10 mg by mouth daily. Take 1 tablet twice a week      . valsartan-hydrochlorothiazide (DIOVAN-HCT) 160-12.5 MG per tablet Take 1  tablet by mouth daily.  30 tablet  4  . isosorbide mononitrate (IMDUR) 30 MG 24 hr tablet Take 1 tablet (30 mg total) by mouth daily.  30 tablet  0   No current facility-administered medications for this visit.    Past Medical History  Diagnosis Date  . DM (diabetes mellitus) type II uncontrolled with eye manifestation 2013    Diabetic retinopathy with retinal hemorrhages since;, recurrent in August 2014 treated with Avastin shots  . Hypertension   . CAD S/P percutaneous coronary angioplasty 06/12/2011    status post complex PCI to the RCA requiring the use of GuideLiner catheter.  2  . Presence of drug coated stent in right coronary artery 06/12/2011    Subtotal proximal RCA (tortuous) --> complex PCI requiring Guideliner: 2 overlapping Promus element DES stents.  . Type IVa MI, peak Troponin 1.63 - peri-PCI infarction  during complex stenting of torutuos RCA.  Likely thromboembolic event with PDA occlusion. 06/12/2011    Very difficult, complex procedure requiring multiple guidewires, guide-liner, etc.  Angiographic evidence of distal thrombo / anthero-embolism as likely etiology of Troponin of ~1.58. Pt remains asymptomatic.   Marland Kitchen NST (non-stress test) nonreactive     Myoview, January 2013: Diaphragmatic attenuation versus infarct in inferior wall, but no ischemia, and  . Shortness of breath on exertion   . GERD (gastroesophageal reflux disease)   . Anxiety   . 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 EF 55-60% with normal WM; mild concentric hypertrophy with normal relaxation.  Marland Kitchen PAD (peripheral artery disease)     with claudication    Past Surgical History  Procedure Laterality Date  . Coronary angioplasty with stent placement Right September 2012    2 overlapping Promus DES 2.7 mm at 12 mm x2; postdilated to 3 mm  . Tonsillectomy    . Myoview stress test  January '13    Diaphragmatic attenuation versus inferior infarct. No ischemia. Normal EF normal wall motion.  . Myoview stress test  June 2014    no ischemia    [id number] flu symptoms, though he did receive vaccine, no weight changes Skin:no rashes or ulcers HEENT:+ blurred vision occ , no congestion CV:see HPI PUL:see HPI GI:no diarrhea constipation or melena, no indigestion GU:no hematuria, no dysuria MS:no joint pain, no claudication Neuro:no syncope, no lightheadedness Endo:+ diabetes, no thyroid disease  PHYSICAL EXAM BP 110/60  Pulse 53  Ht 5\' 9"  (1.753 m)  Wt 206 lb 14.4 oz (93.849 kg)  BMI 30.54 kg/m2 General:Pleasant affect, NAD Skin:Warm and dry, brisk capillary refill HEENT:normocephalic, sclera clear, mucus membranes moist Neck:supple, no JVD, no bruits  Heart:S1S2 RRR without murmur, gallup, rub or click Lungs:clear without rales, rhonchi, or wheezes [id number], soft, non  tender, + BS, do not palpate liver spleen or masses Ext:no lower ext edema, 2+ pedal pulses, 2+ radial pulses Neuro:alert and oriented, MAE, follows commands, + facial symmetry [id number] at 53 with early repol in II, III, AVF.  Increased early repol from earlier tracings.  ASSESSMENT AND PLAN DOE (dyspnea on exertion)  DOE increasing like before coronary stent  Chronic kidney disease (CKD), stage II (mild) Will hold Valsartan after Sunday for cath on Tuesday to protect his kidneys.  CAD (coronary artery disease), complex PCI/DES to RCA  Recent negative nuc study in June 2014.  With known CAD will proceed with cardiac cath.  Dr. Ellyn Hack discussed this with him.  HTN (hypertension) Well controlled  Diabetes mellitus, type  2 IDDM With retinopathy, nephropathy followed by Dr. Forde Dandy.   Dyslipidemia, Zocor intol Tolerating Crestor   Dr. Ellyn Hack discussed procedure with the pt.  He will hold his valsartan after Sundays dose.  I added Imdur back to help prevent DOE/angina.  Plan for cath on the 6th with Dr. Ellyn Hack.  He will need IV fluids from time of arrival.  We would like cath around 10:00AM.  Pt agreeable.

## 2013-06-28 NOTE — Progress Notes (Addendum)
ANTICOAGULATION CONSULT NOTE - Initial Consult  Pharmacy Consult for heparin Indication: chest pain/ACS  Allergies  Allergen Reactions  . Atorvastatin Other (See Comments)    Leg pain  . Ibuprofen Hives  . Pravastatin   . Simvastatin Other (See Comments)    Leg pain    Patient Measurements: Height: 5\' 9"  (175.3 cm) Weight: 205 lb (92.987 kg) IBW/kg (Calculated) : 70.7 Heparin Dosing Weight: 90 kg  Vital Signs: Temp: 97.6 F (36.4 C) (01/06 1719) Temp src: Oral (01/06 1719) BP: 168/63 mmHg (01/06 1800) Pulse Rate: 59 (01/06 1800)  Labs:  Recent Labs  06/28/13 1101  CREATININE 1.93*    Estimated Creatinine Clearance: 40.7 ml/min (by C-G formula based on Cr of 1.93).   Medical History: Past Medical History  Diagnosis Date  . DM (diabetes mellitus) type II uncontrolled with eye manifestation 2013    Diabetic retinopathy with retinal hemorrhages since;, recurrent in August 2014 treated with Avastin shots  . Hypertension   . CAD S/P percutaneous coronary angioplasty 06/12/2011    status post complex PCI to the RCA requiring the use of GuideLiner catheter.  2  . Presence of drug coated stent in right coronary artery 06/12/2011    Subtotal proximal RCA (tortuous) --> complex PCI requiring Guideliner: 2 overlapping Promus element DES stents.  . 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  Likely thromboembolic event with PDA occlusion. 06/12/2011    Very difficult, complex procedure requiring multiple guidewires, guide-liner, etc.  Angiographic evidence of distal thrombo / anthero-embolism as likely etiology of Troponin of ~1.58. Pt remains asymptomatic.   Marland Kitchen NST (non-stress test) nonreactive     Myoview, January 2013: Diaphragmatic attenuation versus infarct in inferior wall, but no ischemia, and  . Shortness of breath on exertion   . GERD (gastroesophageal reflux disease)   . Anxiety   . Benign heart murmur  March 2014     Echo: Aortic sclerosis without stenosis. EF 55-60% with normal WM; mild concentric hypertrophy with normal relaxation.  Marland Kitchen PAD (peripheral artery disease)     with claudication    Assessment: 21 YOM s/p cath with severe ostial left main stenosis, plan for CABG on Friday. Pharmacy is consulted to start IV heparin 8 hrs post sheath removal (1610). No anticoagulation PTA, baseline INR 0.94, aPTT 33, Hgb 10.3, plt 346.   Goal of Therapy:  Heparin level 0.3-0.7 units/ml Monitor platelets by anticoagulation protocol: Yes   Plan:  - Start IV heparin 1200 units/hr, with no bolus at 0015 - f/u AM heparin level and CBC  Maryanna Shape, PharmD, BCPS  Clinical Pharmacist  Pager: [phone]   06/28/2013,7:47 PM

## 2013-06-29 ENCOUNTER — Encounter (HOSPITAL_COMMUNITY): Payer: Self-pay | Admitting: Interventional Cardiology

## 2013-06-29 ENCOUNTER — Inpatient Hospital Stay (HOSPITAL_COMMUNITY): Payer: Medicare Other

## 2013-06-29 ENCOUNTER — Encounter: Payer: Self-pay | Admitting: Vascular Surgery

## 2013-06-29 DIAGNOSIS — I6529 Occlusion and stenosis of unspecified carotid artery: Secondary | ICD-10-CM

## 2013-06-29 DIAGNOSIS — N182 Chronic kidney disease, stage 2 (mild): Secondary | ICD-10-CM

## 2013-06-29 DIAGNOSIS — I517 Cardiomegaly: Secondary | ICD-10-CM

## 2013-06-29 DIAGNOSIS — Z01810 Encounter for preprocedural cardiovascular examination: Secondary | ICD-10-CM

## 2013-06-29 DIAGNOSIS — D649 Anemia, unspecified: Secondary | ICD-10-CM

## 2013-06-29 DIAGNOSIS — I2510 Atherosclerotic heart disease of native coronary artery without angina pectoris: Principal | ICD-10-CM

## 2013-06-29 DIAGNOSIS — I742 Embolism and thrombosis of arteries of the upper extremities: Secondary | ICD-10-CM

## 2013-06-29 LAB — BASIC METABOLIC PANEL
BUN: 32 mg/dL — ABNORMAL HIGH (ref 6–23)
CO2: 25 mEq/L (ref 19–32)
Calcium: 9.3 mg/dL (ref 8.4–10.5)
Chloride: 105 mEq/L (ref 96–112)
Creatinine, Ser: 1.95 mg/dL — ABNORMAL HIGH (ref 0.50–1.35)
GFR calc Af Amer: 39 mL/min — ABNORMAL LOW (ref >90)
GFR calc non Af Amer: 33 mL/min — ABNORMAL LOW (ref >90)
Glucose, Bld: 205 mg/dL — ABNORMAL HIGH (ref 70–99)
Potassium: 4.7 mEq/L (ref 3.7–5.3)
Sodium: 139 mEq/L (ref 137–147)

## 2013-06-29 LAB — URINALYSIS, ROUTINE W REFLEX MICROSCOPIC
Bilirubin Urine: NEGATIVE
Glucose, UA: 500 mg/dL — AB
Hgb urine dipstick: NEGATIVE
Ketones, ur: NEGATIVE mg/dL
Leukocytes, UA: NEGATIVE
Nitrite: NEGATIVE
Protein, ur: NEGATIVE mg/dL
Specific Gravity, Urine: 1.014 (ref 1.005–1.030)
Urobilinogen, UA: 0.2 mg/dL (ref 0.0–1.0)
pH: 6.5 (ref 5.0–8.0)

## 2013-06-29 LAB — PULMONARY FUNCTION TEST
FEF 25-75 Post: 3.21 L/sec
FEF 25-75 Pre: 2.49 L/sec
FEF2575-%Change-Post: 29 %
FEF2575-%Pred-Post: 133 %
FEF2575-%Pred-Pre: 103 %
FEV1-%Change-Post: 4 %
FEV1-%Pred-Post: 94 %
FEV1-%Pred-Pre: 90 %
FEV1-Post: 2.99 L
FEV1-Pre: 2.85 L
FEV1FVC-%Change-Post: 5 %
FEV1FVC-%Pred-Pre: 105 %
FEV6-%Change-Post: 0 %
FEV6-%Pred-Post: 89 %
FEV6-%Pred-Pre: 89 %
FEV6-Post: 3.62 L
FEV6-Pre: 3.62 L
FEV6FVC-%Change-Post: 0 %
FEV6FVC-%Pred-Post: 106 %
FEV6FVC-%Pred-Pre: 105 %
FVC-%Change-Post: 0 %
FVC-%Pred-Post: 84 %
FVC-%Pred-Pre: 85 %
FVC-Post: 3.63 L
FVC-Pre: 3.66 L
Post FEV1/FVC ratio: 82 %
Post FEV6/FVC ratio: 100 %
Pre FEV1/FVC ratio: 78 %
Pre FEV6/FVC Ratio: 99 %

## 2013-06-29 LAB — CBC
HEMATOCRIT: 29.5 % — AB (ref 39.0–52.0)
HEMOGLOBIN: 9.8 g/dL — AB (ref 13.0–17.0)
MCH: 28.5 pg (ref 26.0–34.0)
MCHC: 33.2 g/dL (ref 30.0–36.0)
MCV: 85.8 fL (ref 78.0–100.0)
Platelets: 236 10*3/uL (ref 150–400)
RBC: 3.44 MIL/uL — ABNORMAL LOW (ref 4.22–5.81)
RDW: 13.2 % (ref 11.5–15.5)
WBC: 4.9 10*3/uL (ref 4.0–10.5)

## 2013-06-29 LAB — GLUCOSE, CAPILLARY
GLUCOSE-CAPILLARY: 158 mg/dL — AB (ref 70–99)
GLUCOSE-CAPILLARY: 160 mg/dL — AB (ref 70–99)
Glucose-Capillary: 210 mg/dL — ABNORMAL HIGH (ref 70–99)
Glucose-Capillary: 161 mg/dL — ABNORMAL HIGH (ref 70–99)
Glucose-Capillary: 160 mg/dL — ABNORMAL HIGH (ref 70–99)

## 2013-06-29 LAB — SURGICAL PCR SCREEN
MRSA, PCR: NEGATIVE
Staphylococcus aureus: NEGATIVE

## 2013-06-29 LAB — HEMOGLOBIN A1C
Hgb A1c MFr Bld: 10.2 % — ABNORMAL HIGH (ref <5.7)
MEAN PLASMA GLUCOSE: 246 mg/dL — AB (ref <117)

## 2013-06-29 LAB — HEPARIN LEVEL (UNFRACTIONATED)
HEPARIN UNFRACTIONATED: 0.11 [IU]/mL — AB (ref 0.30–0.70)
HEPARIN UNFRACTIONATED: 0.29 [IU]/mL — AB (ref 0.30–0.70)
Heparin Unfractionated: 0.26 IU/mL — ABNORMAL LOW (ref 0.30–0.70)

## 2013-06-29 MED ORDER — ALBUTEROL SULFATE (2.5 MG/3ML) 0.083% IN NEBU
2.5000 mg | INHALATION_SOLUTION | Freq: Once | RESPIRATORY_TRACT | Status: AC
  Administered 2013-06-29: 11:00:00 2.5 mg via RESPIRATORY_TRACT

## 2013-06-29 NOTE — Progress Notes (Addendum)
Pre-op Cardiac Surgery  Carotid Findings:  Findings suggest 1-39% right internal carotid artery stenosis, and 40-59% left internal carotid artery stenosis. Vertebral arteries are patent with antegrade flow.  06/29/2013 4:41 PM Maudry Mayhew, RVT, RDCS, RDMS     Upper Extremity Right Left  Brachial Pressures 143  Triphasic  120  Triphasic   Radial Waveforms Triphasic  Triphasic   Ulnar Waveforms Triphasic  Triphasic   Palmar Arch (Allen's Test) Within normal limits  Within normal limits     Lower  Extremity Right Left  Dorsalis Pedis 97  Biphasic  95  Monophasic   Anterior Tibial    Posterior Tibial 111  Biphasic  96  Monophasic   Ankle/Brachial Indices 0.78 0.67    Laverne Hursey, RVT 06/30/2013 2:31 PM

## 2013-06-29 NOTE — Progress Notes (Addendum)
Admit Complaint: s/p elective cath  Anticoagulation: 73 YOM s/p cath with severe ostial left main stenosis, plan for CABG on Friday. Pharmacy was consulted to start IV heparin 8 hrs post sheath removal (1610). No anticoagulation PTA, baseline INR 0.94, aPTT 33, Hgb 10.3, plt 346.  HL 0.11 early this am but only 4 hrs level.  Repeat level is subtherapeutic at 0.26.  Heparin dosing weight 90 kg  Heparin level goal 0.3-0.7; Monitor platelets by anticoagulation protocol: Yes  Hematology / Oncology: Hgb down to 9.8 but no sign of bleeding. Plt 236 K   Plan:  - Increase heparin to 1300 units/hr - 8hr heparin level

## 2013-06-29 NOTE — Consult Note (Signed)
[facility] [place] HISTORY AND PHYSICAL   Requesting: Servando Snare Reason for consult: carotid stenosis and claudcation History of Present Illness:  Patient is a 68 y.o. year old male who presents for evaluation of asymptomatic carotid disease and claudication.  Pt has known carotid stenosis 50-70% left with no right ICA stenosis and left subclavian stenosis but no history of TIA amaurosis or stroke.  He also has history of bilateral leg cramping/fatigue walking 300-500 yards over the last few months.  He denies rest pain or ulcers on the feet.   Pt scheduled for CABG for severe CAD later this week.  He was admitted with dyspnea on exertion.  Other medical problems include diabetes, hypertension, reflux, elevated cholesterol which are currently controlled.  Past Medical History  Diagnosis Date  . DM (diabetes mellitus) type II uncontrolled with eye manifestation 2013    Diabetic retinopathy with retinal hemorrhages since;, recurrent in August 2014 treated with Avastin shots  . Hypertension   . CAD S/P percutaneous coronary angioplasty 06/12/2011    status post complex PCI to the RCA requiring the use of GuideLiner catheter.  2  . Presence of drug coated stent in right coronary artery 06/12/2011    Subtotal proximal RCA (tortuous) --> complex PCI requiring Guideliner: 2 overlapping Promus element DES stents.  . 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  Likely thromboembolic event with PDA occlusion. 06/12/2011    Very difficult, complex procedure requiring multiple guidewires, guide-liner, etc.  Angiographic evidence of distal thrombo / anthero-embolism as likely etiology of Troponin of ~1.58. Pt remains asymptomatic.   Marland Kitchen NST (non-stress test) nonreactive     Myoview, January 2013: Diaphragmatic attenuation versus infarct in inferior wall, but no ischemia, and  . Shortness of breath on exertion   . GERD (gastroesophageal  reflux disease)   . Anxiety   . 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 EF 55-60% with normal WM; mild concentric hypertrophy with normal relaxation.  Marland Kitchen PAD (peripheral artery disease)     with claudication  . High cholesterol   . Migraine ~ 2004    "once"  . Anemia     Past Surgical History  Procedure Laterality Date  . Tonsillectomy    . Myoview stress test  January '13    Diaphragmatic attenuation versus inferior infarct. No ischemia. Normal EF normal wall motion.  . Myoview stress test  June 2014    no ischemia  . Coronary angioplasty with stent placement Right September 2012    2 overlapping Promus DES 2.7 mm at 12 mm x2; postdilated to 3 mm  . Cardiac catheterization  06/28/2013  . Retinal laser procedure Bilateral     "more than once"     Social History History  Substance Use Topics  . Smoking status: Former Smoker -- 2.00 packs/day for 27 years    Types: Cigarettes    Quit date: 11/23/1996  . Smokeless tobacco: Never Used  . Alcohol Use: 0.0 oz/week     Comment: 06/28/2013 "haven't had any alcohol in several weeks"    Family History Family History  Problem Relation Age of Onset  . Cancer Sister     Lung cancer  . Cancer Mother   . Cancer Father   . Cancer Maternal Grandmother   . Cancer Paternal Grandfather     Allergies  Allergies  Allergen Reactions  . Atorvastatin Other (See Comments)  Leg pain  . Ibuprofen Hives  . Pravastatin   . Simvastatin Other (See Comments)    Leg pain     Current Facility-Administered Medications  Medication Dose Route Frequency Provider Last Rate Last Dose  . 0.9 %  sodium chloride infusion  250 mL Intravenous PRN Leonie Man, MD      . acetaminophen (TYLENOL) tablet 650 mg  650 mg Oral Q4H PRN Leonie Man, MD      . aspirin EC tablet 81 mg  81 mg Oral Daily Leonie Man, MD   81 mg at 06/29/13 1028  . fenofibrate tablet 160 mg  160 mg Oral Daily Leonie Man, MD    160 mg at 06/29/13 1028  . heparin ADULT infusion 100 units/mL (25000 units/250 mL)  1,300 Units/hr Intravenous Continuous Leonie Man, MD 12 mL/hr at 06/29/13 0700 1,200 Units/hr at 06/29/13 0700  . insulin aspart (novoLOG) injection 10 Units  10 Units Subcutaneous TID WC Leonie Man, MD   10 Units at 06/29/13 1245  . insulin glargine (LANTUS) injection 50 Units  50 Units Subcutaneous BID Leonie Man, MD   50 Units at 06/29/13 1028  . isosorbide mononitrate (IMDUR) 24 hr tablet 30 mg  30 mg Oral Daily Leonie Man, MD   30 mg at 06/29/13 1028  . morphine 2 MG/ML injection 2 mg  2 mg Intravenous Q1H PRN Leonie Man, MD      . nebivolol (BYSTOLIC) tablet 2.5 mg  2.5 mg Oral Daily Leonie Man, MD   2.5 mg at 06/29/13 1028  . nitroGLYCERIN (NITROSTAT) SL tablet 0.4 mg  0.4 mg Sublingual Q5 min PRN Leonie Man, MD      . ondansetron [facility]) injection 4 mg  4 mg Intravenous Q6H PRN Leonie Man, MD      . pantoprazole (PROTONIX) EC tablet 40 mg  40 mg Oral Daily Leonie Man, MD   40 mg at 06/29/13 1028  . rosuvastatin (CRESTOR) tablet 20 mg  20 mg Oral QPC supper Leonie Man, MD   20 mg at 06/28/13 1835  . sodium chloride 0.9 % injection 3 mL  3 mL Intravenous Q12H Leonie Man, MD   3 mL at 06/29/13 1029  . sodium chloride 0.9 % injection 3 mL  3 mL Intravenous PRN Leonie Man, MD        ROS:   General:  No weight loss, Fever, chills, had respiratory flu several weeks ago  HEENT: No recent headaches, no nasal bleeding, no visual changes, no sore throat  Neurologic: No dizziness, blackouts, seizures. No recent symptoms of stroke or mini- stroke. No recent episodes of slurred speech, or temporary blindness.  Cardiac: No recent episodes of chest pain/pressure, no shortness of breath at rest.  + shortness of breath with exertion.  Denies history of atrial fibrillation or irregular heartbeat  Vascular: No history of rest pain in feet. No history of  non-healing ulcer, No history of DVT   Pulmonary: No home oxygen, no productive cough, no hemoptysis,  No asthma or wheezing  Musculoskeletal:  [ ]  Arthritis, [ ]  Low back pain,  [ ]  Joint pain  Hematologic: No history of hypercoagulable state.  No history of easy bleeding.  No history of anemia  Gastrointestinal: No hematochezia or melena,  No gastroesophageal reflux, no trouble swallowing  Urinary: [x ] chronic Kidney disease, [ ]  on HD - [ ]  MWF or [ ]   TTHS, [ ]  Burning with urination, [ ]  Frequent urination, [ ]  Difficulty urinating;   Skin: No rashes  Psychological: No history of anxiety,  No history of depression   Physical Examination  Filed Vitals:   06/29/13 0034 06/29/13 0630 06/29/13 0805 06/29/13 1226  BP: 174/51 168/56 143/75 155/58  Pulse: 58 56 56 59  Temp: 98.1 F (36.7 C) 98.4 F (36.9 C) 97.7 F (36.5 C) 97.7 F (36.5 C)  TempSrc: Oral Oral Oral Oral  Resp: 18 20 20 18   Height:      Weight: 203 lb 0.7 oz (92.1 kg)     SpO2: 96% 96% 96% 96%    Body mass index is 29.97 kg/(m^2).  General:  Alert and oriented, no acute distress HEENT: Normal Neck: No JVD, 2+ carotid pulses Pulmonary: no dyspnea, normal chest excursion Cardiac: Regular Rate and Rhythm Abdomen: Soft, non-tender, non-distended, no mass Skin: No rash Extremity Pulses:  2+ radial, brachial,1-2+ femoral, absent dorsalis pedis, posterior tibial pulses bilaterally Musculoskeletal: No deformity or edema  Neurologic: Upper and lower extremity motor 5/5 and symmetric  DATA:  Carotid duplex pending, ABI 0.9 bilaterally with toe pressure > 100 both feet duplex Nov 2014   BMET    Component Value Date/Time   NA 139 06/29/2013 0400   K 4.7 06/29/2013 0400   CL 105 06/29/2013 0400   CO2 25 06/29/2013 0400   GLUCOSE 205* 06/29/2013 0400   BUN 32* 06/29/2013 0400   CREATININE 1.95* 06/29/2013 0400   CREATININE 2.04* 06/24/2013 1507   CALCIUM 9.3 06/29/2013 0400   GFRNONAA 33* 06/29/2013 0400   GFRAA 39*  06/29/2013 0400    CBC    Component Value Date/Time   WBC 4.9 06/29/2013 0400   RBC 3.44* 06/29/2013 0400   HGB 9.8* 06/29/2013 0400   HCT 29.5* 06/29/2013 0400   PLT 236 06/29/2013 0400   MCV 85.8 06/29/2013 0400   MCH 28.5 06/29/2013 0400   MCHC 33.2 06/29/2013 0400   RDW 13.2 06/29/2013 0400      ASSESSMENT:  1. Known asymptomatic carotid stenosis will review duplex to consider combined CEA/CABG will review new duplex results  2.  Claudication mild symptoms currently will review again after he has recovered from CABG with renal insufficiency may defer lower extremity arteriogram down the road as his symptoms are fairly mild at this point   PLAN:  Follow up duplex tomorrow.  Ruta Hinds, MD [facility] [place] Office: [phone] Pager: [phone]

## 2013-06-29 NOTE — Progress Notes (Signed)
  Echocardiogram 2D Echocardiogram has been performed.  JAECE, HEREDIA 06/29/2013, 9:53 AM

## 2013-06-29 NOTE — Progress Notes (Signed)
[id number] Cardiac Rehab Completed pre-op education with pt and wife. Answered their questions. I gave them preparing for heart surgery booklet and pt care guide. I encouraged them to watch going for heart surgery video. They voice understanding. Pt asking to walk, denies any cp or SOB. States that he has not had pain, just some SOB with walking up stairs. Respiratory Therapy here to do pulmonary fuction test. I will return tomorrow to answer any questions and ambulate if appropriate. Deon Pilling, RN 06/29/2013 10:38 AM

## 2013-06-29 NOTE — Progress Notes (Signed)
Inpatient Diabetes Program Recommendations  AACE/ADA: New Consensus Statement on Inpatient Glycemic Control (2013)  Target Ranges:  Prepandial:   less than 140 mg/dL      Peak postprandial:   less than 180 mg/dL (1-2 hours)      Critically ill patients:  140 - 180 mg/dL   Results for Lance Collier, Lance Collier (MRN [id number]) as of 06/29/2013 09:30  Ref. Range 06/28/2013 21:16 06/29/2013 08:28  Glucose-Capillary Latest Range: 70-99 mg/dL 257 (H) 160 (H)    Inpatient Diabetes Program Recommendations Correction (SSI): While inpatient, please order CBGs with Novolog moderate correction ACHS (in addition to Novolog 10 units TID with meals for meal coverage).  Thanks, Barnie Alderman, RN, MSN, CCRN Diabetes Coordinator Inpatient Diabetes Program [phone] (Team Pager) [phone] ([facility] office) [phone] [facility] office)

## 2013-06-29 NOTE — Progress Notes (Signed)
Utilization Review Completed.Lance Collier T1/12/2013  

## 2013-06-29 NOTE — Progress Notes (Signed)
ANTICOAGULATION CONSULT NOTE - Follow Up Consult  Pharmacy Consult for heparin Indication: CAD awaiting CABG  Labs:  Recent Labs  06/28/13 1101 06/29/13 0400  HEPARINUNFRC  --  0.11*  CREATININE 1.93* 1.95*    Assessment/Plan:  67yo male subtherapeutic on heparin with initial dosing post-cath, now awaiting CABG, though lab was drawn just 4hr after gtt started, pt w/ CKD.  Will continue gtt for now and recheck level 8hr after gtt started.  Wynona Neat, PharmD, BCPS  06/29/2013,5:25 AM

## 2013-06-29 NOTE — Progress Notes (Signed)
Admit Complaint: s/p elective cath  Anticoagulation: 76 YOM s/p cath with severe ostial left main stenosis, plan for CABG on Friday. Pharmacy was consulted to start IV heparin 8 hrs post sheath removal on Tuesday 06/29/12.  His heparin level is slightly low at 0.29 ~ 6 hours after rate increased to 1300 units/hr.   Heparin level goal 0.3-0.7; Monitor platelets by anticoagulation protocol: Yes  Hematology / Oncology: Hgb down to 9.8 but no sign of bleeding. Plt 236 K   Plan:  - Increase heparin to 1500 units/hr -daily HL and CBC while on heparin Eudelia Bunch, Pharm.D. [id number] 06/29/2013 6:26 PM

## 2013-06-29 NOTE — Progress Notes (Signed)
SUBJECTIVE:  No chest discomfort.  Worried about CABG.  OBJECTIVE:   Vitals:   Filed Vitals:   06/28/13 2000 06/28/13 2001 06/29/13 0034 06/29/13 0630  BP: 127/39 127/39 174/51 168/56  Pulse: 62 63 58 56  Temp:  98.1 F (36.7 C) 98.1 F (36.7 C) 98.4 F (36.9 C)  TempSrc:  Oral Oral Oral  Resp:  18 18 20   Height:      Weight:   203 lb 0.7 oz (92.1 kg)   SpO2: 94% 94% 96% 96%   I&O's:   Intake/Output Summary (Last 24 hours) at 06/29/13 0750 Last data filed at 06/29/13 0700  Gross per 24 hour  Intake 1367.35 ml  Output      0 ml  Net 1367.35 ml   TELEMETRY: Reviewed telemetry pt in sinus bradycardia:     PHYSICAL EXAM General: Well developed, well nourished, in no acute distress Head: Marland Kitchen   Normal cephalic and atramatic  Lungs:   Clear bilaterally to auscultation and percussion. Heart:  Bradycardic, S1 S2 No JVD.   Abdomen: , abdomen soft and non-tender Msk:  Back normal, normal gait. Normal strength and tone for age. Extremities:  No edema. Right DP +1; no right groin hematoma Neuro: Alert and oriented X 3. Psych: Flat affect, responds appropriately   LABS: Basic Metabolic Panel:  Recent Labs  06/28/13 1101 06/29/13 0400  NA 135* 139  K 5.0 4.7  CL 98 105  CO2 24 25  GLUCOSE 195* 205*  BUN 37* 32*  CREATININE 1.93* 1.95*  CALCIUM 9.4 9.3   Liver Function Tests: No results found for this basename: AST, ALT, ALKPHOS, BILITOT, PROT, ALBUMIN,  in the last 72 hours No results found for this basename: LIPASE, AMYLASE,  in the last 72 hours CBC:  Recent Labs  06/29/13 0400  WBC 4.9  HGB 9.8*  HCT 29.5*  MCV 85.8  PLT 236   Cardiac Enzymes: No results found for this basename: CKTOTAL, CKMB, CKMBINDEX, TROPONINI,  in the last 72 hours BNP: No components found with this basename: POCBNP,  D-Dimer: No results found for this basename: DDIMER,  in the last 72 hours Hemoglobin A1C: No results found for this basename: HGBA1C,  in the last 72  hours Fasting Lipid Panel: No results found for this basename: CHOL, HDL, LDLCALC, TRIG, CHOLHDL, LDLDIRECT,  in the last 72 hours Thyroid Function Tests: No results found for this basename: TSH, T4TOTAL, FREET3, T3FREE, THYROIDAB,  in the last 72 hours Anemia Panel: No results found for this basename: VITAMINB12, FOLATE, FERRITIN, TIBC, IRON, RETICCTPCT,  in the last 72 hours Coag Panel:   Lab Results  Component Value Date   INR 0.94 06/24/2013    RADIOLOGY: Dg Chest 2 View  06/24/2013   CLINICAL DATA:  Dyspnea. Hypertension and diabetes. Preop respiratory exam for cardiac catheterization.  EXAM: CHEST  2 VIEW  COMPARISON:  06/06/2011  FINDINGS: The heart size and mediastinal contours are within normal limits. Both lungs are clear. The visualized skeletal structures are unremarkable.  IMPRESSION: No active cardiopulmonary disease.   Electronically Signed   By: Earle Gell M.D.   On: 06/24/2013 15:09      ASSESSMENT: 68 y/o with multivessel CAD, renal insufficiency, mild anemia  PLAN:  Continue medical therapy with Imdur and beta blocker.  HR slow.  WOuld not increase beta blocker.  Heparin drip for now.  Hbg slightly decreased but no sign of groin bleed.  Cr. Stable  Vascular w/u per Dr. Oneida Alar.  CABG, possibly Friday.  Jettie Booze., MD  06/29/2013  7:50 AM

## 2013-06-30 ENCOUNTER — Encounter: Payer: Medicare Other | Admitting: Vascular Surgery

## 2013-06-30 ENCOUNTER — Other Ambulatory Visit: Payer: Self-pay | Admitting: *Deleted

## 2013-06-30 ENCOUNTER — Telehealth: Payer: Self-pay | Admitting: Vascular Surgery

## 2013-06-30 ENCOUNTER — Inpatient Hospital Stay (HOSPITAL_COMMUNITY): Payer: Medicare Other

## 2013-06-30 DIAGNOSIS — I739 Peripheral vascular disease, unspecified: Secondary | ICD-10-CM

## 2013-06-30 DIAGNOSIS — Z01810 Encounter for preprocedural cardiovascular examination: Secondary | ICD-10-CM

## 2013-06-30 DIAGNOSIS — I2510 Atherosclerotic heart disease of native coronary artery without angina pectoris: Secondary | ICD-10-CM

## 2013-06-30 LAB — BLOOD GAS, ARTERIAL
Acid-base deficit: 0 mmol/L (ref 0.0–2.0)
BICARBONATE: 24.3 meq/L — AB (ref 20.0–24.0)
Drawn by: 34767
FIO2: 0.21 %
O2 SAT: 95.1 %
PH ART: 7.39 (ref 7.350–7.450)
Patient temperature: 98.6
TCO2: 25.5 mmol/L (ref 0–100)
pCO2 arterial: 41 mmHg (ref 35.0–45.0)
pO2, Arterial: 87.5 mmHg (ref 80.0–100.0)

## 2013-06-30 LAB — HEPARIN LEVEL (UNFRACTIONATED): HEPARIN UNFRACTIONATED: 0.56 [IU]/mL (ref 0.30–0.70)

## 2013-06-30 LAB — CBC
HCT: 30.8 % — ABNORMAL LOW (ref 39.0–52.0)
Hemoglobin: 10 g/dL — ABNORMAL LOW (ref 13.0–17.0)
MCH: 28.1 pg (ref 26.0–34.0)
MCHC: 32.5 g/dL (ref 30.0–36.0)
MCV: 86.5 fL (ref 78.0–100.0)
PLATELETS: 218 10*3/uL (ref 150–400)
RBC: 3.56 MIL/uL — ABNORMAL LOW (ref 4.22–5.81)
RDW: 13.1 % (ref 11.5–15.5)
WBC: 4.8 10*3/uL (ref 4.0–10.5)

## 2013-06-30 LAB — BASIC METABOLIC PANEL
BUN: 30 mg/dL — ABNORMAL HIGH (ref 6–23)
CALCIUM: 9.8 mg/dL (ref 8.4–10.5)
CO2: 23 mEq/L (ref 19–32)
Chloride: 103 mEq/L (ref 96–112)
Creatinine, Ser: 2.03 mg/dL — ABNORMAL HIGH (ref 0.50–1.35)
GFR calc Af Amer: 37 mL/min — ABNORMAL LOW (ref >90)
GFR, EST NON AFRICAN AMERICAN: 32 mL/min — AB (ref >90)
Glucose, Bld: 104 mg/dL — ABNORMAL HIGH (ref 70–99)
Potassium: 4.6 mEq/L (ref 3.7–5.3)
SODIUM: 140 meq/L (ref 137–147)

## 2013-06-30 LAB — ABO/RH: ABO/RH(D): B POS

## 2013-06-30 LAB — GLUCOSE, CAPILLARY
GLUCOSE-CAPILLARY: 102 mg/dL — AB (ref 70–99)
GLUCOSE-CAPILLARY: 190 mg/dL — AB (ref 70–99)
Glucose-Capillary: 159 mg/dL — ABNORMAL HIGH (ref 70–99)
Glucose-Capillary: 140 mg/dL — ABNORMAL HIGH (ref 70–99)

## 2013-06-30 LAB — APTT: aPTT: 92 seconds — ABNORMAL HIGH (ref 24–37)

## 2013-06-30 LAB — PROTIME-INR
INR: 0.94 (ref 0.00–1.49)
Prothrombin Time: 12.4 seconds (ref 11.6–15.2)

## 2013-06-30 MED ORDER — POTASSIUM CHLORIDE 2 MEQ/ML IV SOLN
80.0000 meq | INTRAVENOUS | Status: DC
  Filled 2013-06-30: qty 40

## 2013-06-30 MED ORDER — CHLORHEXIDINE GLUCONATE 4 % EX LIQD
60.0000 mL | Freq: Once | CUTANEOUS | Status: AC
  Administered 2013-06-30: 4 via TOPICAL
  Filled 2013-06-30 (×2): qty 60

## 2013-06-30 MED ORDER — DEXMEDETOMIDINE HCL IN NACL 400 MCG/100ML IV SOLN
0.1000 ug/kg/h | INTRAVENOUS | Status: DC
  Filled 2013-06-30: qty 100

## 2013-06-30 MED ORDER — TEMAZEPAM 15 MG PO CAPS
15.0000 mg | ORAL_CAPSULE | Freq: Once | ORAL | Status: AC | PRN
  Administered 2013-06-30: 22:00:00 15 mg via ORAL
  Filled 2013-06-30: qty 1

## 2013-06-30 MED ORDER — MAGNESIUM SULFATE 50 % IJ SOLN
40.0000 meq | INTRAMUSCULAR | Status: DC
  Filled 2013-06-30: qty 10

## 2013-06-30 MED ORDER — SODIUM CHLORIDE 0.9 % IV SOLN
INTRAVENOUS | Status: DC
  Administered 2013-07-01: 69.8 mL/h via INTRAVENOUS
  Filled 2013-06-30: qty 40

## 2013-06-30 MED ORDER — BISACODYL 5 MG PO TBEC: 5.0000 mg | DELAYED_RELEASE_TABLET | Freq: Once | ORAL | Status: DC

## 2013-06-30 MED ORDER — DOPAMINE-DEXTROSE 3.2-5 MG/ML-% IV SOLN
2.0000 ug/kg/min | INTRAVENOUS | Status: DC
  Filled 2013-06-30: qty 250

## 2013-06-30 MED ORDER — PLASMA-LYTE 148 IV SOLN
INTRAVENOUS | Status: DC
  Filled 2013-06-30: qty 2.5

## 2013-06-30 MED ORDER — DEXTROSE 5 % IV SOLN
750.0000 mg | INTRAVENOUS | Status: DC
  Filled 2013-06-30: qty 750

## 2013-06-30 MED ORDER — VANCOMYCIN HCL 10 G IV SOLR
1250.0000 mg | INTRAVENOUS | Status: DC
  Administered 2013-07-01: 1250 mg via INTRAVENOUS
  Filled 2013-06-30: qty 1250

## 2013-06-30 MED ORDER — METOPROLOL TARTRATE 12.5 MG HALF TABLET
12.5000 mg | ORAL_TABLET | Freq: Once | ORAL | Status: DC
  Filled 2013-06-30: qty 1

## 2013-06-30 MED ORDER — PHENYLEPHRINE HCL 10 MG/ML IJ SOLN
30.0000 ug/min | INTRAMUSCULAR | Status: DC
  Administered 2013-07-01: 20 ug/min via INTRAVENOUS
  Filled 2013-06-30: qty 2

## 2013-06-30 MED ORDER — SODIUM CHLORIDE 0.9 % IV SOLN
INTRAVENOUS | Status: DC
  Filled 2013-06-30: qty 30

## 2013-06-30 MED ORDER — NITROGLYCERIN IN D5W 200-5 MCG/ML-% IV SOLN
2.0000 ug/min | INTRAVENOUS | Status: DC
  Administered 2013-07-01: 20 ug/min via INTRAVENOUS
  Filled 2013-06-30: qty 250

## 2013-06-30 MED ORDER — CHLORHEXIDINE GLUCONATE 4 % EX LIQD
60.0000 mL | Freq: Once | CUTANEOUS | Status: AC
  Administered 2013-07-01: 4 via TOPICAL
  Filled 2013-06-30: qty 60

## 2013-06-30 MED ORDER — DEXTROSE 5 % IV SOLN
0.5000 ug/min | INTRAVENOUS | Status: DC
  Filled 2013-06-30: qty 4

## 2013-06-30 MED ORDER — DEXTROSE 5 % IV SOLN
1.5000 g | INTRAVENOUS | Status: DC
  Administered 2013-07-01: 1.5 g via INTRAVENOUS
  Filled 2013-06-30: qty 1.5

## 2013-06-30 MED ORDER — SODIUM CHLORIDE 0.9 % IV SOLN
INTRAVENOUS | Status: DC
  Administered 2013-07-01: 2.8 [IU]/h via INTRAVENOUS
  Filled 2013-06-30: qty 1

## 2013-06-30 NOTE — Telephone Encounter (Addendum)
sched 6 m appt for 12/22/13, labs @ 9 & 10, f/u @ 10:30, mailed pt appt letter to inform him of appt  Message copied by Georgiann Mccoy on Thu Jun 30, 2013 11:26 AM ------      Message from: Alfonso Patten      Created: Thu Jun 30, 2013 11:01 AM                   ----- Message -----         From: Elam Dutch, MD         Sent: 06/30/2013   9:57 AM           To: Patrici Ranks, Alfonso Patten, RN, #            Daily visit today            He needs 6 month follow up with me not Vinnie Level.      He needs ABI and bilat carotid duplex then            Juanda Crumble ------

## 2013-06-30 NOTE — Progress Notes (Signed)
Patient ID: Lance Collier, male   DOB: 1946/03/15, 68 y.o.   MRN: [id number]      [address]       [place] [address]             [phone]                 2 Days Post-Op Procedure(s) (LRB): LEFT HEART CATHETERIZATION WITH CORONARY ANGIOGRAM (N/A)  LOS: 2 days   Subjective: No chest pain  Objective: Vital signs in last 24 hours: Patient Vitals for the past 24 hrs:  BP Temp Temp src Pulse Resp SpO2 Weight  06/30/13 0753 189/60 mmHg 97.7 F (36.5 C) Oral 50 20 98 % -  06/30/13 0654 165/43 mmHg 98.2 F (36.8 C) Oral 50 20 95 % -  06/30/13 0040 - - - - - - 204 lb 5.9 oz (92.7 kg)  06/29/13 2316 167/58 mmHg 98.2 F (36.8 C) Oral 57 20 95 % -  06/29/13 2158 160/49 mmHg 98.4 F (36.9 C) Oral 52 18 95 % -  06/29/13 1614 156/64 mmHg 98 F (36.7 C) Oral 55 18 96 % -  06/29/13 1226 155/58 mmHg 97.7 F (36.5 C) Oral 59 18 96 % -    Filed Weights   06/28/13 0736 06/29/13 0034 06/30/13 0040  Weight: 205 lb (92.987 kg) 203 lb 0.7 oz (92.1 kg) 204 lb 5.9 oz (92.7 kg)    Hemodynamic parameters for last 24 hours:    Intake/Output from previous day: 01/07 0701 - 01/08 0700 In: 1160.1 [P.O.:720; I.V.:440.1] Out: 1400 [Urine:1400] Intake/Output this shift:    Scheduled Meds: . aspirin EC  81 mg Oral Daily  . bisacodyl  5 mg Oral Once  . chlorhexidine  60 mL Topical Once   And  . [START ON 07/01/2013] chlorhexidine  60 mL Topical Once  . fenofibrate  160 mg Oral Daily  . insulin aspart  10 Units Subcutaneous TID WC  . insulin glargine  50 Units Subcutaneous BID  . isosorbide mononitrate  30 mg Oral Daily  . [START ON 07/01/2013] metoprolol tartrate  12.5 mg Oral Once  . nebivolol  2.5 mg Oral Daily  . pantoprazole  40 mg Oral Daily  . rosuvastatin  20 mg Oral QPC supper  . sodium chloride  3 mL Intravenous Q12H   Continuous Infusions: . heparin 1,500 Units/hr (06/30/13 0600)   PRN Meds:.sodium chloride, acetaminophen, morphine injection, nitroGLYCERIN,  ondansetron (ZOFRAN) IV, sodium chloride, temazepam  General appearance: alert, cooperative and appears older than stated age Neurologic: intact Heart: regular rate and rhythm, S1, S2 normal, early systolic m murmur, click, rub or gallop Lungs: clear to auscultation bilaterally Abdomen: soft, non-tender; bowel sounds normal; no masses,  no organomegaly Extremities: no changes  Lab Results: CBC: Recent Labs  06/29/13 0400 06/30/13 0605  WBC 4.9 4.8  HGB 9.8* 10.0*  HCT 29.5* 30.8*  PLT 236 218   BMET:  Recent Labs  06/29/13 0400 06/30/13 0605  NA 139 140  K 4.7 4.6  CL 105 103  CO2 25 23  GLUCOSE 205* 104*  BUN 32* 30*  CREATININE 1.95* 2.03*  CALCIUM 9.3 9.8    PT/INR:  Recent Labs  06/30/13 0605  LABPROT 12.4  INR 0.94     Radiology Dg Chest 2 View  06/30/2013   CLINICAL DATA:  Coronary artery disease.  EXAM: CHEST  2 VIEW  COMPARISON:  June 24, 2013.  FINDINGS: The heart size and mediastinal contours  are within normal limits. Both lungs are clear. No pleural effusion or pneumothorax is noted. The visualized skeletal structures are unremarkable.  IMPRESSION: No active cardiopulmonary disease.   Electronically Signed   By: Sabino Dick M.D.   On: 06/30/2013 08:14   ECHO and Carotid dopplers reviewed Cr stable at baseline 2.0   Assessment/Plan: S/P Procedure(s) (LRB): LEFT HEART CATHETERIZATION WITH CORONARY ANGIOGRAM (N/A) Plan CABG tomorrow The goals risks and alternatives of the planned surgical procedure CABG  have been discussed with the patient in detail. The risks of the procedure including death, infection, stroke, myocardial infarction, bleeding, blood transfusion have all been discussed specifically. Patient aware he is at increased risk due to chronic renal disease and DM poorly controlled DM, cerebral vascular and peripheral vascular disease  I have quoted Lance Collier a 5 % of perioperative mortality and a complication rate as high as 30 %. The  patient's questions have been answered.Lance Collier is willing  to proceed with the planned procedure in am.   Grace Isaac MD 06/30/2013 9:21 AM

## 2013-06-30 NOTE — Progress Notes (Signed)
CARDIAC REHAB PHASE I   PRE:  Rate/Rhythm: 56 SB    BP: sitting 158/47    SaO2:   MODE:  Ambulation: 500 ft   POST:  Rate/Rhythm: 68 SR    BP: sitting 160/57     SaO2:   Tolerated well. Denied angina/SOB. Pt sts he would have to do more than he just did to cause angina typically. All questions answered. Pt very nervous about surgery with many repetitive questions.  [place], [place], ACSM 06/30/2013 9:52 AM

## 2013-06-30 NOTE — Consult Note (Signed)
[facility]  Subjective  - No complaints   Objective 189/60 50 97.7 F (36.5 C) (Oral) 20 98%  Intake/Output Summary (Last 24 hours) at 06/30/13 0955 Last data filed at 06/30/13 0600  Gross per 24 hour  Intake 920.12 ml  Output   1400 ml  Net -479.88 ml   Neuro- awake alert no significant change  Carotid duplex <40% right, 40-60% left  Assessment/Planning: Bilateral moderate asymptomatic carotid stenosis.  Will get follow up duplex 6 months continue antiplatelet therapy and mgmt diabetes cholesterol  Claudication- pt prefers conservative mgmt for now with walking program and risk factor modification  Will sign off.  We will schedule follow up in 6 months  Bralynn Velador E 06/30/2013 9:55 AM --  Laboratory Lab Results:  Recent Labs  06/29/13 0400 06/30/13 0605  WBC 4.9 4.8  HGB 9.8* 10.0*  HCT 29.5* 30.8*  PLT 236 218   BMET  Recent Labs  06/29/13 0400 06/30/13 0605  NA 139 140  K 4.7 4.6  CL 105 103  CO2 25 23  GLUCOSE 205* 104*  BUN 32* 30*  CREATININE 1.95* 2.03*  CALCIUM 9.3 9.8    COAG Lab Results  Component Value Date   INR 0.94 06/30/2013   INR 0.94 06/24/2013   No results found for this basename: PTT

## 2013-06-30 NOTE — Progress Notes (Signed)
Heparin per pharmacy  Admit Complaint: s/p elective cath  Anticoagulation: 38 YOM s/p cath with severe ostial left main stenosis, plan for CABG on Friday. Pharmacy was consulted to start IV heparin 8 hrs post sheath removal (1610). No anticoagulation PTA, baseline INR 0.94, aPTT 33, Hgb 10.3, plt 346. Heparin dosing weight 90 kg.  Heparin level is therapeutic a 0.56 this morning  6hr heparin level goal 0.3-0.7; Monitor platelets by anticoagulation protocol: Yes  Hematology / Oncology: Hgb stable at 10 and Plt at 218 K  Plan:  - Cont heparin at 1500 units/hr -daily HL and CBC CABG/CEA Friday

## 2013-06-30 NOTE — Progress Notes (Signed)
SUBJECTIVE:  No chest discomfort or SOB.  OBJECTIVE:   Vitals:   Filed Vitals:   06/29/13 2316 06/30/13 0040 06/30/13 0654 06/30/13 0753  BP: 167/58  165/43 189/60  Pulse: 57  50 50  Temp: 98.2 F (36.8 C)  98.2 F (36.8 C) 97.7 F (36.5 C)  TempSrc: Oral  Oral Oral  Resp: 20  20 20   Height:      Weight:  204 lb 5.9 oz (92.7 kg)    SpO2: 95%  95% 98%   I&O's:    Intake/Output Summary (Last 24 hours) at 06/30/13 1151 Last data filed at 06/30/13 0600  Gross per 24 hour  Intake 869.87 ml  Output    600 ml  Net 269.87 ml   TELEMETRY: Reviewed telemetry pt in sinus bradycardia:     PHYSICAL EXAM General: Well developed, well nourished, in no acute distress Head: Marland Kitchen   Normal cephalic and atramatic  Lungs:   Clear bilaterally to auscultation and percussion. Heart:  Bradycardic, S1 S2 No JVD.   Abdomen: , abdomen soft and non-tender Msk:  Back normal, normal gait. Normal strength and tone for age. Extremities:  No edema. Right DP +1; no right groin hematoma Neuro: Alert and oriented X 3. Psych: Flat affect, responds appropriately   LABS: Basic Metabolic Panel:  Recent Labs  06/29/13 0400 06/30/13 0605  NA 139 140  K 4.7 4.6  CL 105 103  CO2 25 23  GLUCOSE 205* 104*  BUN 32* 30*  CREATININE 1.95* 2.03*  CALCIUM 9.3 9.8   CBC:  Recent Labs  06/29/13 0400 06/30/13 0605  WBC 4.9 4.8  HGB 9.8* 10.0*  HCT 29.5* 30.8*  MCV 85.8 86.5  PLT 236 218   Hemoglobin A1C:  Recent Labs  06/29/13 0400  HGBA1C 10.2*   Coag Panel:   Lab Results  Component Value Date   INR 0.94 06/30/2013   INR 0.94 06/24/2013    RADIOLOGY: Dg Chest 2 View  06/24/2013   CLINICAL DATA:  Dyspnea. Hypertension and diabetes. Preop respiratory exam for cardiac catheterization.  EXAM: CHEST  2 VIEW  COMPARISON:  06/06/2011  FINDINGS: The heart size and mediastinal contours are within normal limits. Both lungs are clear. The visualized skeletal structures are unremarkable.  IMPRESSION: No  active cardiopulmonary disease.   Electronically Signed   By: Earle Gell M.D.   On: 06/24/2013 15:09   Echo:Study Conclusions  - Left ventricle: The cavity size was normal. There was focal basal hypertrophy. Systolic function was normal. The estimated ejection fraction was in the range of 60% to 65%. Wall motion was normal; there were no regional wall motion abnormalities. - Left atrium: The atrium was mildly dilated.  Carotid doppler: 1-39% RICA and 123456 LICA stenosis.  ASSESSMENT: 68 y/o with multivessel CAD, renal insufficiency, mild anemia  PLAN:  Continue medical therapy with Imdur and beta blocker.   Heparin drip for now.    Cr. Stable   CABG Friday.  Alric Geise [name], MD  06/30/2013  11:51 AM

## 2013-07-01 ENCOUNTER — Inpatient Hospital Stay (HOSPITAL_COMMUNITY): Payer: Medicare Other | Admitting: Certified Registered Nurse Anesthetist

## 2013-07-01 ENCOUNTER — Encounter (HOSPITAL_COMMUNITY): Payer: Self-pay | Admitting: Certified Registered Nurse Anesthetist

## 2013-07-01 ENCOUNTER — Inpatient Hospital Stay (HOSPITAL_COMMUNITY): Payer: Medicare Other

## 2013-07-01 ENCOUNTER — Encounter (HOSPITAL_COMMUNITY)
Admission: RE | Disposition: A | Discharge status: Home / Self Care | Payer: Medicare Other | Source: Ambulatory Visit | Attending: Cardiothoracic Surgery

## 2013-07-01 ENCOUNTER — Encounter (HOSPITAL_COMMUNITY): Payer: Medicare Other | Admitting: Certified Registered Nurse Anesthetist

## 2013-07-01 DIAGNOSIS — Z951 Presence of aortocoronary bypass graft: Secondary | ICD-10-CM | POA: Insufficient documentation

## 2013-07-01 DIAGNOSIS — I2510 Atherosclerotic heart disease of native coronary artery without angina pectoris: Secondary | ICD-10-CM

## 2013-07-01 HISTORY — PX: CORONARY ARTERY BYPASS GRAFT: SHX141

## 2013-07-01 HISTORY — DX: Presence of aortocoronary bypass graft: Z95.1

## 2013-07-01 HISTORY — PX: INTRAOPERATIVE TRANSESOPHAGEAL ECHOCARDIOGRAM: SHX5062

## 2013-07-01 LAB — GLUCOSE, CAPILLARY
GLUCOSE-CAPILLARY: 164 mg/dL — AB (ref 70–99)
GLUCOSE-CAPILLARY: 176 mg/dL — AB (ref 70–99)
GLUCOSE-CAPILLARY: 116 mg/dL — AB (ref 70–99)
GLUCOSE-CAPILLARY: 146 mg/dL — AB (ref 70–99)
GLUCOSE-CAPILLARY: 107 mg/dL — AB (ref 70–99)
Glucose-Capillary: 140 mg/dL — ABNORMAL HIGH (ref 70–99)
Glucose-Capillary: 121 mg/dL — ABNORMAL HIGH (ref 70–99)
Glucose-Capillary: 113 mg/dL — ABNORMAL HIGH (ref 70–99)
Glucose-Capillary: 105 mg/dL — ABNORMAL HIGH (ref 70–99)
Glucose-Capillary: 103 mg/dL — ABNORMAL HIGH (ref 70–99)

## 2013-07-01 LAB — POCT I-STAT 3, ART BLOOD GAS (G3+)
ACID-BASE DEFICIT: 2 mmol/L (ref 0.0–2.0)
ACID-BASE DEFICIT: 2 mmol/L (ref 0.0–2.0)
Acid-base deficit: 3 mmol/L — ABNORMAL HIGH (ref 0.0–2.0)
Acid-base deficit: 2 mmol/L (ref 0.0–2.0)
Acid-base deficit: 2 mmol/L (ref 0.0–2.0)
BICARBONATE: 23.2 meq/L (ref 20.0–24.0)
BICARBONATE: 23.6 meq/L (ref 20.0–24.0)
Bicarbonate: 22.5 mEq/L (ref 20.0–24.0)
Bicarbonate: 23.4 mEq/L (ref 20.0–24.0)
Bicarbonate: 23.6 mEq/L (ref 20.0–24.0)
O2 SAT: 100 %
O2 Saturation: 94 %
O2 Saturation: 96 %
O2 Saturation: 92 %
O2 Saturation: 95 %
PCO2 ART: 41.9 mmHg (ref 35.0–45.0)
PCO2 ART: 42.9 mmHg (ref 35.0–45.0)
PH ART: 7.325 — AB (ref 7.350–7.450)
PH ART: 7.351 (ref 7.350–7.450)
PO2 ART: 70 mmHg — AB (ref 80.0–100.0)
PO2 ART: 86 mmHg (ref 80.0–100.0)
Patient temperature: 36.8
Patient temperature: 37.8
TCO2: 24 mmol/L (ref 0–100)
TCO2: 25 mmol/L (ref 0–100)
TCO2: 25 mmol/L (ref 0–100)
TCO2: 25 mmol/L (ref 0–100)
TCO2: 25 mmol/L (ref 0–100)
pCO2 arterial: 43.2 mmHg (ref 35.0–45.0)
pCO2 arterial: 44 mmHg (ref 35.0–45.0)
pCO2 arterial: 45.9 mmHg — ABNORMAL HIGH (ref 35.0–45.0)
pH, Arterial: 7.351 (ref 7.350–7.450)
pH, Arterial: 7.337 — ABNORMAL LOW (ref 7.350–7.450)
pH, Arterial: 7.321 — ABNORMAL LOW (ref 7.350–7.450)
pO2, Arterial: 335 mmHg — ABNORMAL HIGH (ref 80.0–100.0)
pO2, Arterial: 74 mmHg — ABNORMAL LOW (ref 80.0–100.0)
pO2, Arterial: 88 mmHg (ref 80.0–100.0)

## 2013-07-01 LAB — POCT I-STAT 4, (NA,K, GLUC, HGB,HCT)
GLUCOSE: 122 mg/dL — AB (ref 70–99)
GLUCOSE: 118 mg/dL — AB (ref 70–99)
Glucose, Bld: 154 mg/dL — ABNORMAL HIGH (ref 70–99)
Glucose, Bld: 149 mg/dL — ABNORMAL HIGH (ref 70–99)
Glucose, Bld: 173 mg/dL — ABNORMAL HIGH (ref 70–99)
Glucose, Bld: 216 mg/dL — ABNORMAL HIGH (ref 70–99)
Glucose, Bld: 178 mg/dL — ABNORMAL HIGH (ref 70–99)
HCT: 29 % — ABNORMAL LOW (ref 39.0–52.0)
HCT: 21 % — ABNORMAL LOW (ref 39.0–52.0)
HCT: 25 % — ABNORMAL LOW (ref 39.0–52.0)
HCT: 36 % — ABNORMAL LOW (ref 39.0–52.0)
HCT: 36 % — ABNORMAL LOW (ref 39.0–52.0)
HEMATOCRIT: 26 % — AB (ref 39.0–52.0)
HEMATOCRIT: 24 % — AB (ref 39.0–52.0)
HEMOGLOBIN: 9.9 g/dL — AB (ref 13.0–17.0)
HEMOGLOBIN: 8.8 g/dL — AB (ref 13.0–17.0)
HEMOGLOBIN: 8.2 g/dL — AB (ref 13.0–17.0)
HEMOGLOBIN: 12.2 g/dL — AB (ref 13.0–17.0)
HEMOGLOBIN: 12.2 g/dL — AB (ref 13.0–17.0)
Hemoglobin: 7.1 g/dL — ABNORMAL LOW (ref 13.0–17.0)
Hemoglobin: 8.5 g/dL — ABNORMAL LOW (ref 13.0–17.0)
POTASSIUM: 4.4 meq/L (ref 3.7–5.3)
POTASSIUM: 5.3 meq/L (ref 3.7–5.3)
Potassium: 4.9 mEq/L (ref 3.7–5.3)
Potassium: 5 mEq/L (ref 3.7–5.3)
Potassium: 5.7 mEq/L — ABNORMAL HIGH (ref 3.7–5.3)
Potassium: 6.1 mEq/L — ABNORMAL HIGH (ref 3.7–5.3)
Potassium: 4.2 mEq/L (ref 3.7–5.3)
SODIUM: 139 meq/L (ref 137–147)
SODIUM: 137 meq/L (ref 137–147)
Sodium: 137 mEq/L (ref 137–147)
Sodium: 133 mEq/L — ABNORMAL LOW (ref 137–147)
Sodium: 131 mEq/L — ABNORMAL LOW (ref 137–147)
Sodium: 135 mEq/L — ABNORMAL LOW (ref 137–147)
Sodium: 136 mEq/L — ABNORMAL LOW (ref 137–147)

## 2013-07-01 LAB — POCT I-STAT, CHEM 8
BUN: 24 mg/dL — AB (ref 6–23)
CALCIUM ION: 1.25 mmol/L (ref 1.13–1.30)
CHLORIDE: 104 meq/L (ref 96–112)
CREATININE: 2 mg/dL — AB (ref 0.50–1.35)
Glucose, Bld: 141 mg/dL — ABNORMAL HIGH (ref 70–99)
HCT: 39 % (ref 39.0–52.0)
Hemoglobin: 13.3 g/dL (ref 13.0–17.0)
Potassium: 4.2 mEq/L (ref 3.7–5.3)
Sodium: 139 mEq/L (ref 137–147)
TCO2: 24 mmol/L (ref 0–100)

## 2013-07-01 LAB — CBC
HCT: 28.4 % — ABNORMAL LOW (ref 39.0–52.0)
HCT: 29.6 % — ABNORMAL LOW (ref 39.0–52.0)
HCT: 31.7 % — ABNORMAL LOW (ref 39.0–52.0)
Hemoglobin: 9.2 g/dL — ABNORMAL LOW (ref 13.0–17.0)
Hemoglobin: 9.5 g/dL — ABNORMAL LOW (ref 13.0–17.0)
Hemoglobin: 10.3 g/dL — ABNORMAL LOW (ref 13.0–17.0)
MCH: 28.1 pg (ref 26.0–34.0)
MCH: 27.5 pg (ref 26.0–34.0)
MCH: 27.8 pg (ref 26.0–34.0)
MCHC: 32.4 g/dL (ref 30.0–36.0)
MCHC: 32.1 g/dL (ref 30.0–36.0)
MCHC: 32.5 g/dL (ref 30.0–36.0)
MCV: 86.9 fL (ref 78.0–100.0)
MCV: 85.8 fL (ref 78.0–100.0)
MCV: 85.4 fL (ref 78.0–100.0)
PLATELETS: 150 10*3/uL (ref 150–400)
Platelets: 209 10*3/uL (ref 150–400)
Platelets: 162 10*3/uL (ref 150–400)
RBC: 3.27 MIL/uL — ABNORMAL LOW (ref 4.22–5.81)
RBC: 3.45 MIL/uL — AB (ref 4.22–5.81)
RBC: 3.71 MIL/uL — ABNORMAL LOW (ref 4.22–5.81)
RDW: 13.2 % (ref 11.5–15.5)
RDW: 13 % (ref 11.5–15.5)
RDW: 13.1 % (ref 11.5–15.5)
WBC: 5.3 10*3/uL (ref 4.0–10.5)
WBC: 7.4 10*3/uL (ref 4.0–10.5)
WBC: 9.2 10*3/uL (ref 4.0–10.5)

## 2013-07-01 LAB — PREPARE RBC (CROSSMATCH)

## 2013-07-01 LAB — BASIC METABOLIC PANEL
BUN: 31 mg/dL — ABNORMAL HIGH (ref 6–23)
CO2: 24 mEq/L (ref 19–32)
Calcium: 9.4 mg/dL (ref 8.4–10.5)
Chloride: 104 mEq/L (ref 96–112)
Creatinine, Ser: 2.09 mg/dL — ABNORMAL HIGH (ref 0.50–1.35)
GFR calc Af Amer: 36 mL/min — ABNORMAL LOW (ref >90)
GFR calc non Af Amer: 31 mL/min — ABNORMAL LOW (ref >90)
Glucose, Bld: 161 mg/dL — ABNORMAL HIGH (ref 70–99)
Potassium: 4 mEq/L (ref 3.7–5.3)
Sodium: 140 mEq/L (ref 137–147)

## 2013-07-01 LAB — MAGNESIUM: Magnesium: 2.8 mg/dL — ABNORMAL HIGH (ref 1.5–2.5)

## 2013-07-01 LAB — HEPARIN LEVEL (UNFRACTIONATED): HEPARIN UNFRACTIONATED: 0.47 [IU]/mL (ref 0.30–0.70)

## 2013-07-01 LAB — CREATININE, SERUM
Creatinine, Ser: 1.91 mg/dL — ABNORMAL HIGH (ref 0.50–1.35)
GFR calc Af Amer: 40 mL/min — ABNORMAL LOW (ref >90)
GFR calc non Af Amer: 34 mL/min — ABNORMAL LOW (ref >90)

## 2013-07-01 LAB — APTT: APTT: 32 s (ref 24–37)

## 2013-07-01 LAB — PLATELET COUNT: Platelets: 181 10*3/uL (ref 150–400)

## 2013-07-01 LAB — HEMOGLOBIN AND HEMATOCRIT, BLOOD
HCT: 23.6 % — ABNORMAL LOW (ref 39.0–52.0)
Hemoglobin: 7.8 g/dL — ABNORMAL LOW (ref 13.0–17.0)

## 2013-07-01 LAB — PROTIME-INR
INR: 1.28 (ref 0.00–1.49)
PROTHROMBIN TIME: 15.7 s — AB (ref 11.6–15.2)

## 2013-07-01 SURGERY — CORONARY ARTERY BYPASS GRAFTING (CABG)
Anesthesia: General | Site: Chest

## 2013-07-01 MED ORDER — BISACODYL 10 MG RE SUPP
10.0000 mg | Freq: Every day | RECTAL | Status: DC
Start: 2013-07-02 — End: 2013-07-04

## 2013-07-01 MED ORDER — SODIUM CHLORIDE 0.9 % IV SOLN
INTRAVENOUS | Status: DC
  Administered 2013-07-02: 03:00:00 via INTRAVENOUS
  Filled 2013-07-01 (×2): qty 1

## 2013-07-01 MED ORDER — PROPOFOL 10 MG/ML IV BOLUS
INTRAVENOUS | Status: DC | PRN
  Administered 2013-07-01: 50 mg via INTRAVENOUS

## 2013-07-01 MED ORDER — BISACODYL 5 MG PO TBEC
10.0000 mg | DELAYED_RELEASE_TABLET | Freq: Every day | ORAL | Status: DC
  Administered 2013-07-02 – 2013-07-04 (×3): 10 mg via ORAL
  Filled 2013-07-01 (×2): qty 2

## 2013-07-01 MED ORDER — MIDAZOLAM HCL 5 MG/5ML IJ SOLN
INTRAMUSCULAR | Status: DC | PRN
  Administered 2013-07-01: 4 mg via INTRAVENOUS
  Administered 2013-07-01: 1 mg via INTRAVENOUS
  Administered 2013-07-01: 2 mg via INTRAVENOUS
  Administered 2013-07-01: 1 mg via INTRAVENOUS
  Administered 2013-07-01: 5 mg via INTRAVENOUS
  Administered 2013-07-01: 2 mg via INTRAVENOUS

## 2013-07-01 MED ORDER — LACTATED RINGERS IV SOLN
INTRAVENOUS | Status: DC | PRN
  Administered 2013-07-01: 07:00:00 via INTRAVENOUS

## 2013-07-01 MED ORDER — OXYCODONE HCL 5 MG PO TABS
5.0000 mg | ORAL_TABLET | ORAL | Status: DC | PRN
  Administered 2013-07-02 – 2013-07-04 (×6): 10 mg via ORAL
  Filled 2013-07-01 (×6): qty 2

## 2013-07-01 MED ORDER — MIDAZOLAM HCL 2 MG/2ML IJ SOLN
2.0000 mg | INTRAMUSCULAR | Status: DC | PRN
Start: 2013-07-01 — End: 2013-07-04

## 2013-07-01 MED ORDER — FENTANYL CITRATE 0.05 MG/ML IJ SOLN
INTRAMUSCULAR | Status: DC | PRN
  Administered 2013-07-01 (×2): 250 ug via INTRAVENOUS
  Administered 2013-07-01: 100 ug via INTRAVENOUS
  Administered 2013-07-01: 250 ug via INTRAVENOUS
  Administered 2013-07-01 (×2): 100 ug via INTRAVENOUS
  Administered 2013-07-01: 50 ug via INTRAVENOUS
  Administered 2013-07-01: 100 ug via INTRAVENOUS
  Administered 2013-07-01: 250 ug via INTRAVENOUS
  Administered 2013-07-01: 150 ug via INTRAVENOUS
  Administered 2013-07-01: 100 ug via INTRAVENOUS
  Administered 2013-07-01: 150 ug via INTRAVENOUS

## 2013-07-01 MED ORDER — SODIUM CHLORIDE 0.9 % IV SOLN: 250.0000 mL | INTRAVENOUS | Status: DC

## 2013-07-01 MED ORDER — FAMOTIDINE IN NACL 20-0.9 MG/50ML-% IV SOLN
20.0000 mg | Freq: Two times a day (BID) | INTRAVENOUS | Status: AC
  Administered 2013-07-01: 20 mg via INTRAVENOUS

## 2013-07-01 MED ORDER — ACETAMINOPHEN 650 MG RE SUPP
650.0000 mg | Freq: Once | RECTAL | Status: AC
  Administered 2013-07-01: 650 mg via RECTAL

## 2013-07-01 MED ORDER — DOPAMINE-DEXTROSE 3.2-5 MG/ML-% IV SOLN: 0.0000 ug/kg/min | INTRAVENOUS | Status: DC

## 2013-07-01 MED ORDER — POTASSIUM CHLORIDE 10 MEQ/50ML IV SOLN: 10.0000 meq | INTRAVENOUS | Status: AC

## 2013-07-01 MED ORDER — MAGNESIUM SULFATE 50 % IJ SOLN
40.0000 meq | INTRAMUSCULAR | Status: DC
  Filled 2013-07-01: qty 10

## 2013-07-01 MED ORDER — PLASMA-LYTE 148 IV SOLN
INTRAVENOUS | Status: DC | PRN
  Administered 2013-07-01: 09:00:00 via INTRAVASCULAR

## 2013-07-01 MED ORDER — DOPAMINE-DEXTROSE 3.2-5 MG/ML-% IV SOLN
2.0000 ug/kg/min | INTRAVENOUS | Status: AC
  Administered 2013-07-01: 3 ug/kg/min via INTRAVENOUS

## 2013-07-01 MED ORDER — SODIUM CHLORIDE 0.9 % IJ SOLN
3.0000 mL | Freq: Two times a day (BID) | INTRAMUSCULAR | Status: DC
  Administered 2013-07-02: 12:00:00 via INTRAVENOUS
  Administered 2013-07-02 – 2013-07-03 (×3): 3 mL via INTRAVENOUS

## 2013-07-01 MED ORDER — ACETAMINOPHEN 160 MG/5ML PO SOLN: 650.0000 mg | Freq: Once | ORAL | Status: AC

## 2013-07-01 MED ORDER — METOPROLOL TARTRATE 12.5 MG HALF TABLET
12.5000 mg | ORAL_TABLET | Freq: Two times a day (BID) | ORAL | Status: DC
  Administered 2013-07-02 – 2013-07-04 (×5): 12.5 mg via ORAL
  Filled 2013-07-01 (×7): qty 1

## 2013-07-01 MED ORDER — VECURONIUM BROMIDE 10 MG IV SOLR
INTRAVENOUS | Status: DC | PRN
  Administered 2013-07-01: 10 mg via INTRAVENOUS
  Administered 2013-07-01 (×2): 5 mg via INTRAVENOUS

## 2013-07-01 MED ORDER — HEMOSTATIC AGENTS (NO CHARGE) OPTIME
TOPICAL | Status: DC | PRN
  Administered 2013-07-01: 1 via TOPICAL

## 2013-07-01 MED ORDER — DEXTROSE 5 % IV SOLN
0.5000 ug/min | INTRAVENOUS | Status: DC
  Filled 2013-07-01: qty 4

## 2013-07-01 MED ORDER — METOPROLOL TARTRATE 1 MG/ML IV SOLN: 2.5000 mg | INTRAVENOUS | Status: DC | PRN

## 2013-07-01 MED ORDER — PHENYLEPHRINE HCL 10 MG/ML IJ SOLN
0.0000 ug/min | INTRAMUSCULAR | Status: DC
  Filled 2013-07-01: qty 2

## 2013-07-01 MED ORDER — GLYCOPYRROLATE 0.2 MG/ML IJ SOLN
INTRAMUSCULAR | Status: DC | PRN
  Administered 2013-07-01: 0.4 mg via INTRAVENOUS

## 2013-07-01 MED ORDER — PROTAMINE SULFATE 10 MG/ML IV SOLN
INTRAVENOUS | Status: DC | PRN
  Administered 2013-07-01: 400 mg via INTRAVENOUS

## 2013-07-01 MED ORDER — NITROGLYCERIN IN D5W 200-5 MCG/ML-% IV SOLN
2.0000 ug/min | INTRAVENOUS | Status: DC
  Filled 2013-07-01: qty 250

## 2013-07-01 MED ORDER — HEPARIN SODIUM (PORCINE) 1000 UNIT/ML IJ SOLN
INTRAMUSCULAR | Status: DC | PRN
  Administered 2013-07-01: 34000 [IU] via INTRAVENOUS
  Administered 2013-07-01: 5000 [IU] via INTRAVENOUS

## 2013-07-01 MED ORDER — NITROGLYCERIN IN D5W 200-5 MCG/ML-% IV SOLN
0.0000 ug/min | INTRAVENOUS | Status: DC
Start: 2013-07-01 — End: 2013-07-04

## 2013-07-01 MED ORDER — ACETAMINOPHEN 500 MG PO TABS
1000.0000 mg | ORAL_TABLET | Freq: Four times a day (QID) | ORAL | Status: DC
Start: 2013-07-02 — End: 2013-07-04
  Administered 2013-07-02 – 2013-07-04 (×10): 1000 mg via ORAL
  Filled 2013-07-01 (×13): qty 2

## 2013-07-01 MED ORDER — DEXTROSE 5 % IV SOLN
750.0000 mg | INTRAVENOUS | Status: DC
  Filled 2013-07-01: qty 750

## 2013-07-01 MED ORDER — SODIUM CHLORIDE 0.9 % IV SOLN
INTRAVENOUS | Status: DC
  Filled 2013-07-01: qty 40

## 2013-07-01 MED ORDER — DEXMEDETOMIDINE HCL IN NACL 400 MCG/100ML IV SOLN
0.4000 ug/kg/h | INTRAVENOUS | Status: DC
  Administered 2013-07-01: 0.5 ug/kg/h via INTRAVENOUS
  Filled 2013-07-01 (×2): qty 100

## 2013-07-01 MED ORDER — ALBUMIN HUMAN 5 % IV SOLN: 250.0000 mL | INTRAVENOUS | Status: AC | PRN

## 2013-07-01 MED ORDER — MORPHINE SULFATE 2 MG/ML IJ SOLN: 1.0000 mg | INTRAMUSCULAR | Status: AC | PRN

## 2013-07-01 MED ORDER — DOCUSATE SODIUM 100 MG PO CAPS
200.0000 mg | ORAL_CAPSULE | Freq: Every day | ORAL | Status: DC
  Administered 2013-07-02 – 2013-07-03 (×2): 200 mg via ORAL
  Filled 2013-07-01 (×2): qty 2

## 2013-07-01 MED ORDER — SODIUM CHLORIDE 0.9 % IV SOLN
INTRAVENOUS | Status: DC
  Filled 2013-07-01: qty 1

## 2013-07-01 MED ORDER — 0.9 % SODIUM CHLORIDE (POUR BTL) OPTIME
TOPICAL | Status: DC | PRN
  Administered 2013-07-01: 1000 mL

## 2013-07-01 MED ORDER — POTASSIUM CHLORIDE 2 MEQ/ML IV SOLN
80.0000 meq | INTRAVENOUS | Status: DC
  Filled 2013-07-01: qty 40

## 2013-07-01 MED ORDER — ASPIRIN 81 MG PO CHEW: 324.0000 mg | CHEWABLE_TABLET | Freq: Every day | ORAL | Status: DC

## 2013-07-01 MED ORDER — SODIUM CHLORIDE 0.9 % IJ SOLN
OROMUCOSAL | Status: DC | PRN
  Administered 2013-07-01 (×2): via TOPICAL

## 2013-07-01 MED ORDER — METOPROLOL TARTRATE 25 MG/10 ML ORAL SUSPENSION
12.5000 mg | Freq: Two times a day (BID) | ORAL | Status: DC
  Filled 2013-07-01 (×7): qty 5

## 2013-07-01 MED ORDER — ALBUMIN HUMAN 5 % IV SOLN
INTRAVENOUS | Status: DC | PRN
  Administered 2013-07-01: 12:00:00 via INTRAVENOUS

## 2013-07-01 MED ORDER — ARTIFICIAL TEARS OP OINT
TOPICAL_OINTMENT | OPHTHALMIC | Status: DC | PRN
  Administered 2013-07-01: 1 via OPHTHALMIC

## 2013-07-01 MED ORDER — VANCOMYCIN HCL IN DEXTROSE 1-5 GM/200ML-% IV SOLN
1000.0000 mg | Freq: Once | INTRAVENOUS | Status: AC
  Administered 2013-07-01: 1000 mg via INTRAVENOUS
  Filled 2013-07-01: qty 200

## 2013-07-01 MED ORDER — SODIUM CHLORIDE 0.9 % IV SOLN: INTRAVENOUS | Status: DC

## 2013-07-01 MED ORDER — SODIUM CHLORIDE 0.45 % IV SOLN
INTRAVENOUS | Status: DC
  Administered 2013-07-01: 14:00:00 via INTRAVENOUS

## 2013-07-01 MED ORDER — ROCURONIUM BROMIDE 100 MG/10ML IV SOLN
INTRAVENOUS | Status: DC | PRN
  Administered 2013-07-01: 50 mg via INTRAVENOUS

## 2013-07-01 MED ORDER — DEXTROSE 5 % IV SOLN
1.5000 g | INTRAVENOUS | Status: DC
  Filled 2013-07-01: qty 1.5

## 2013-07-01 MED ORDER — ONDANSETRON HCL 4 MG/2ML IJ SOLN
4.0000 mg | Freq: Four times a day (QID) | INTRAMUSCULAR | Status: DC | PRN
  Administered 2013-07-02 – 2013-07-03 (×2): 4 mg via INTRAVENOUS
  Filled 2013-07-01 (×2): qty 2

## 2013-07-01 MED ORDER — MAGNESIUM SULFATE 40 MG/ML IJ SOLN
4.0000 g | Freq: Once | INTRAMUSCULAR | Status: AC
  Administered 2013-07-01: 4 g via INTRAVENOUS
  Filled 2013-07-01: qty 100

## 2013-07-01 MED ORDER — PANTOPRAZOLE SODIUM 40 MG PO TBEC
40.0000 mg | DELAYED_RELEASE_TABLET | Freq: Every day | ORAL | Status: DC
Start: 2013-07-03 — End: 2013-07-04
  Administered 2013-07-03 – 2013-07-04 (×2): 40 mg via ORAL
  Filled 2013-07-01 (×2): qty 1

## 2013-07-01 MED ORDER — SODIUM CHLORIDE 0.9 % IJ SOLN: 3.0000 mL | INTRAMUSCULAR | Status: DC | PRN

## 2013-07-01 MED ORDER — ASPIRIN EC 325 MG PO TBEC
325.0000 mg | DELAYED_RELEASE_TABLET | Freq: Every day | ORAL | Status: DC
  Administered 2013-07-02 – 2013-07-03 (×2): 325 mg via ORAL
  Filled 2013-07-01 (×3): qty 1

## 2013-07-01 MED ORDER — MORPHINE SULFATE 2 MG/ML IJ SOLN
2.0000 mg | INTRAMUSCULAR | Status: DC | PRN
  Administered 2013-07-01: 4 mg via INTRAVENOUS
  Administered 2013-07-01: 2 mg via INTRAVENOUS
  Administered 2013-07-02: 4 mg via INTRAVENOUS
  Administered 2013-07-02 (×2): 2 mg via INTRAVENOUS
  Filled 2013-07-01: qty 2
  Filled 2013-07-01 (×2): qty 1
  Filled 2013-07-01: qty 2
  Filled 2013-07-01: qty 1

## 2013-07-01 MED ORDER — SODIUM CHLORIDE 0.9 % IV SOLN
INTRAVENOUS | Status: DC
  Filled 2013-07-01: qty 30

## 2013-07-01 MED ORDER — PHENYLEPHRINE HCL 10 MG/ML IJ SOLN
30.0000 ug/min | INTRAVENOUS | Status: DC
  Filled 2013-07-01: qty 2

## 2013-07-01 MED ORDER — INSULIN REGULAR BOLUS VIA INFUSION
0.0000 [IU] | Freq: Three times a day (TID) | INTRAVENOUS | Status: DC
  Administered 2013-07-02: 1.7 [IU] via INTRAVENOUS
  Filled 2013-07-01: qty 10

## 2013-07-01 MED ORDER — ACETAMINOPHEN 160 MG/5ML PO SOLN
1000.0000 mg | Freq: Four times a day (QID) | ORAL | Status: DC
  Filled 2013-07-01: qty 40

## 2013-07-01 MED ORDER — DEXTROSE 5 % IV SOLN
1.5000 g | Freq: Two times a day (BID) | INTRAVENOUS | Status: AC
  Administered 2013-07-01 – 2013-07-03 (×4): 1.5 g via INTRAVENOUS
  Filled 2013-07-01 (×4): qty 1.5

## 2013-07-01 MED ORDER — LACTATED RINGERS IV SOLN: 500.0000 mL | Freq: Once | INTRAVENOUS | Status: AC | PRN

## 2013-07-01 MED ORDER — LACTATED RINGERS IV SOLN: INTRAVENOUS | Status: DC

## 2013-07-01 MED ORDER — DEXMEDETOMIDINE HCL IN NACL 200 MCG/50ML IV SOLN
0.1000 ug/kg/h | INTRAVENOUS | Status: DC
  Administered 2013-07-01: 0.7 ug/kg/h via INTRAVENOUS

## 2013-07-01 MED FILL — Sodium Bicarbonate IV Soln 8.4%: INTRAVENOUS | Qty: 50 | Status: AC

## 2013-07-01 MED FILL — Sodium Chloride IV Soln 0.9%: INTRAVENOUS | Qty: 2000 | Status: AC

## 2013-07-01 MED FILL — Lidocaine HCl IV Inj 20 MG/ML: INTRAVENOUS | Qty: 5 | Status: AC

## 2013-07-01 MED FILL — Heparin Sodium (Porcine) Inj 1000 Unit/ML: INTRAMUSCULAR | Qty: 30 | Status: AC

## 2013-07-01 MED FILL — Electrolyte-R (PH 7.4) Solution: INTRAVENOUS | Qty: 3000 | Status: AC

## 2013-07-01 MED FILL — Mannitol IV Soln 20%: INTRAVENOUS | Qty: 500 | Status: AC

## 2013-07-01 SURGICAL SUPPLY — 78 items
APPLICATOR COTTON TIP 6IN STRL (MISCELLANEOUS) ×3 IMPLANT
ATTRACTOMAT 16X20 MAGNETIC DRP (DRAPES) ×3 IMPLANT
BAG DECANTER FOR FLEXI CONT (MISCELLANEOUS) ×3 IMPLANT
BANDAGE ELASTIC 4 VELCRO ST LF (GAUZE/BANDAGES/DRESSINGS) ×6 IMPLANT
BANDAGE ELASTIC 6 VELCRO ST LF (GAUZE/BANDAGES/DRESSINGS) ×6 IMPLANT
BANDAGE GAUZE ELAST BULKY 4 IN (GAUZE/BANDAGES/DRESSINGS) ×6 IMPLANT
BLADE STERNUM SYSTEM 6 (BLADE) ×3 IMPLANT
BLADE SURG 11 STRL SS (BLADE) ×3 IMPLANT
CANISTER SUCTION 2500CC (MISCELLANEOUS) ×3 IMPLANT
CANN PRFSN .5XCNCT 15X34-48 (MISCELLANEOUS) ×2
CANNULA AORTIC HI-FLOW 6.5M20F (CANNULA) ×3 IMPLANT
CANNULA PRFSN .5XCNCT 15X34-48 (MISCELLANEOUS) ×2 IMPLANT
CANNULA VEN 2 STAGE (MISCELLANEOUS) ×1
CANNULA VESSEL 3MM 2 BLNT TIP (CANNULA) ×9 IMPLANT
CARDIAC SUCTION (MISCELLANEOUS) ×3 IMPLANT
CATH CPB KIT GERHARDT (MISCELLANEOUS) ×3 IMPLANT
CATH THORACIC 28FR (CATHETERS) ×3 IMPLANT
COVER SURGICAL LIGHT HANDLE (MISCELLANEOUS) ×3 IMPLANT
CRADLE DONUT ADULT HEAD (MISCELLANEOUS) ×3 IMPLANT
DRAIN CHANNEL 28F RND 3/8 FF (WOUND CARE) ×3 IMPLANT
DRAPE CARDIOVASCULAR INCISE (DRAPES) ×1
DRAPE SLUSH/WARMER DISC (DRAPES) ×3 IMPLANT
DRAPE SRG 135X102X78XABS (DRAPES) ×2 IMPLANT
DRSG AQUACEL AG ADV 3.5X14 (GAUZE/BANDAGES/DRESSINGS) ×3 IMPLANT
ELECT BLADE 4.0 EZ CLEAN MEGAD (MISCELLANEOUS) ×3
ELECT REM PT RETURN 9FT ADLT (ELECTROSURGICAL) ×6
ELECTRODE BLDE 4.0 EZ CLN MEGD (MISCELLANEOUS) ×2 IMPLANT
ELECTRODE REM PT RTRN 9FT ADLT (ELECTROSURGICAL) ×4 IMPLANT
GLOVE BIO SURGEON STRL SZ 6.5 (GLOVE) ×18 IMPLANT
GLOVE BIOGEL M 7.0 STRL (GLOVE) ×6 IMPLANT
GLOVE BIOGEL M STER SZ 6 (GLOVE) ×15 IMPLANT
GLOVE BIOGEL PI IND STRL 7.0 (GLOVE) ×6 IMPLANT
GLOVE BIOGEL PI INDICATOR 7.0 (GLOVE) ×3
GOWN STRL NON-REIN LRG LVL3 (GOWN DISPOSABLE) ×24 IMPLANT
HEMOSTAT POWDER SURGIFOAM 1G (HEMOSTASIS) ×9 IMPLANT
HEMOSTAT SURGICEL 2X14 (HEMOSTASIS) ×3 IMPLANT
KIT BASIN OR (CUSTOM PROCEDURE TRAY) ×3 IMPLANT
KIT ROOM TURNOVER OR (KITS) ×3 IMPLANT
KIT SUCTION CATH 14FR (SUCTIONS) ×6 IMPLANT
KIT VASOVIEW W/TROCAR VH 2000 (KITS) ×3 IMPLANT
LEAD PACING MYOCARDI (MISCELLANEOUS) ×3 IMPLANT
MARKER GRAFT CORONARY BYPASS (MISCELLANEOUS) ×9 IMPLANT
NS IRRIG 1000ML POUR BTL (IV SOLUTION) ×18 IMPLANT
PACK OPEN HEART (CUSTOM PROCEDURE TRAY) ×3 IMPLANT
PAD ARMBOARD 7.5X6 YLW CONV (MISCELLANEOUS) ×6 IMPLANT
PAD ELECT DEFIB RADIOL ZOLL (MISCELLANEOUS) ×3 IMPLANT
PENCIL BUTTON HOLSTER BLD 10FT (ELECTRODE) ×3 IMPLANT
SET CARDIOPLEGIA MPS 5001102 (MISCELLANEOUS) ×3 IMPLANT
SOLUTION ANTI FOG 6CC (MISCELLANEOUS) ×3 IMPLANT
SPONGE GAUZE 2X2 NONSTERILE (GAUZE/BANDAGES/DRESSINGS) ×3 IMPLANT
SPONGE GAUZE 4X4 12PLY (GAUZE/BANDAGES/DRESSINGS) ×6 IMPLANT
SPONGE GAUZE 4X4 12PLY STER LF (GAUZE/BANDAGES/DRESSINGS) ×9 IMPLANT
SPONGE LAP 18X18 X RAY DECT (DISPOSABLE) ×6 IMPLANT
SUT BONE WAX W31G (SUTURE) ×3 IMPLANT
SUT MNCRL AB 4-0 PS2 18 (SUTURE) ×6 IMPLANT
SUT PROLENE 3 0 SH1 36 (SUTURE) ×3 IMPLANT
SUT PROLENE 4 0 TF (SUTURE) ×6 IMPLANT
SUT PROLENE 6 0 C 1 30 (SUTURE) ×3 IMPLANT
SUT PROLENE 6 0 CC (SUTURE) ×6 IMPLANT
SUT PROLENE 7 0 BV 1 (SUTURE) ×3 IMPLANT
SUT PROLENE 7 0 BV1 MDA (SUTURE) ×6 IMPLANT
SUT PROLENE 7.0 RB 3 (SUTURE) ×9 IMPLANT
SUT PROLENE 8 0 BV175 6 (SUTURE) ×3 IMPLANT
SUT STEEL 6MS V (SUTURE) ×3 IMPLANT
SUT STEEL SZ 6 DBL 3X14 BALL (SUTURE) ×3 IMPLANT
SUT VIC AB 1 CTX 18 (SUTURE) ×6 IMPLANT
SUT VIC AB 2-0 CT1 27 (SUTURE) ×2
SUT VIC AB 2-0 CT1 TAPERPNT 27 (SUTURE) ×4 IMPLANT
SUTURE E-PAK OPEN HEART (SUTURE) ×3 IMPLANT
SYSTEM SAHARA CHEST DRAIN ATS (WOUND CARE) ×3 IMPLANT
TAPE CLOTH SURG 4X10 WHT LF (GAUZE/BANDAGES/DRESSINGS) ×9 IMPLANT
TOWEL OR 17X24 6PK STRL BLUE (TOWEL DISPOSABLE) ×6 IMPLANT
TOWEL OR 17X26 10 PK STRL BLUE (TOWEL DISPOSABLE) ×6 IMPLANT
TRAY FOLEY IC TEMP SENS 14FR (CATHETERS) ×3 IMPLANT
TUBE FEEDING 8FR 16IN STR KANG (MISCELLANEOUS) ×3 IMPLANT
TUBING INSUFFLATION 10FT LAP (TUBING) ×3 IMPLANT
UNDERPAD 30X30 INCONTINENT (UNDERPADS AND DIAPERS) ×3 IMPLANT
WATER STERILE IRR 1000ML POUR (IV SOLUTION) ×6 IMPLANT

## 2013-07-01 NOTE — Preoperative (Signed)
Beta Blockers   Reason not to administer Beta Blockers:Hold  beta blocker due to Bradycardia (HR less than 50 bpm) 

## 2013-07-01 NOTE — Anesthesia Preprocedure Evaluation (Addendum)
Anesthesia Evaluation  Patient identified by MRN, date of birth, ID band Patient awake    Reviewed: Allergy & Precautions, H&P , NPO status , Patient's Chart, lab work & pertinent test results, reviewed documented beta blocker date and time   History of Anesthesia Complications Negative for: history of anesthetic complications  Airway       Dental   Pulmonary shortness of breath and with exertion, former smoker,          Cardiovascular hypertension, Pt. on medications and Pt. on home beta blockers + angina + CAD, + Past MI, + Cardiac Stents, + Peripheral Vascular Disease and + DOE     Neuro/Psych  Headaches, Anxiety    GI/Hepatic GERD-  Medicated and Controlled,  Endo/Other  diabetes, Well Controlled, Type 2, Insulin Dependent  Renal/GU Renal InsufficiencyRenal disease     Musculoskeletal   Abdominal   Peds  Hematology  (+) anemia ,   Anesthesia Other Findings   Reproductive/Obstetrics                          Anesthesia Physical Anesthesia Plan  ASA: III  Anesthesia Plan: General   Post-op Pain Management:    Induction:   Airway Management Planned: Oral ETT  Additional Equipment: Arterial line, CVP, PA Cath, 3D TEE and Ultrasound Guidance Line Placement  Intra-op Plan:   Post-operative Plan: Extubation in OR  Informed Consent: I have reviewed the patients History and Physical, chart, labs and discussed the procedure including the risks, benefits and alternatives for the proposed anesthesia with the patient or authorized representative who has indicated his/her understanding and acceptance.   Dental advisory given  Plan Discussed with: CRNA, Anesthesiologist and Surgeon  Anesthesia Plan Comments:         Anesthesia Quick Evaluation

## 2013-07-01 NOTE — Progress Notes (Signed)
S/p CABG x 3  Waking up a little agitated  BP 128/62  Pulse 90  Temp(Src) 99.9 F (37.7 C) (Oral)  Resp 24  Ht 5\' 9"  (1.753 m)  Wt 203 lb 14.8 oz (92.5 kg)  BMI 30.10 kg/m2  SpO2 99%   PA 42/15 CO= 5.22  Intake/Output Summary (Last 24 hours) at 07/01/13 1707 Last data filed at 07/01/13 1700  Gross per 24 hour  Intake 4041.9 ml  Output   3380 ml  Net  661.9 ml   Hct 30  Doing well early postop  Vent wean in progress

## 2013-07-01 NOTE — OR Nursing (Signed)
SICU first call @ 1235

## 2013-07-01 NOTE — Transfer of Care (Signed)
Immediate Anesthesia Transfer of Care Note  Patient: Lance Collier  Procedure(s) Performed: Procedure(s) with comments: CORONARY ARTERY BYPASS GRAFTING (CABG) (N/A) - Times 3 using left internal mammary artery and endoscopically harvested bilateral saphenous vein INTRAOPERATIVE TRANSESOPHAGEAL ECHOCARDIOGRAM (N/A)  Patient Location: PICU  Anesthesia Type:General  Level of Consciousness: sedated and Patient remains intubated per anesthesia plan  Airway & Oxygen Therapy: Patient remains intubated per anesthesia plan and Patient placed on Ventilator (see vital sign flow sheet for setting)  Post-op Assessment: Report given to PACU RN and Post -op Vital signs reviewed and stable  Post vital signs: Reviewed and stable  Complications: No apparent anesthesia complications

## 2013-07-01 NOTE — Procedures (Signed)
Extubation Procedure Note  Patient Details:   Name: Lance Collier DOB: [birth year] MRN: [id number]   Airway Documentation:     Evaluation  O2 sats: stable throughout Complications: No apparent complications Patient did tolerate procedure well. Bilateral Breath Sounds:  (coarse)   Yes Patient tolerated rapid wean protocol. VC .7 L and NIF -25. Positive for cuff leak. Extubated to a 4 Lpm nasal cannula. No signs of dyspnea or stridor. Patient refusing to do IS at this time. RN aware. Patient resting comfortably.   Myrtie Neither 07/01/2013, 5:47 PM

## 2013-07-01 NOTE — Brief Op Note (Addendum)
      [address]       [place] [address]             [phone]        1:10 PM  PATIENT:  Lance Collier  68 y.o. male  PRE-OPERATIVE DIAGNOSIS:  Coronary Artery Disease  POST-OPERATIVE DIAGNOSIS:  Coronary Artery Disease  PROCEDURE:  Procedure(s) with comments:  CORONARY ARTERY BYPASS GRAFTING x 3  -LIMA to LAD -SVG to Left Circumflex -SVG to RCA  ENDOSCOPIC SAPHENOUS VEIN HARVEST BILATERAL THIGH -POOR QUALITY  INTRAOPERATIVE TRANSESOPHAGEAL ECHOCARDIOGRAM (N/A)  SURGEON:  Surgeon(s) and Role:    * Grace Isaac, MD - Primary  PHYSICIAN ASSISTANT: Erin Barrett PA-C  ANESTHESIA:   none  EBL:  Total I/O In: [id number] [I.V.:1100; Blood:595] Out: 2470 [Urine:845; Blood:1625]  BLOOD ADMINISTERED: CELLSAVER  DRAINS: Left Pleural Chest    LOCAL MEDICATIONS USED:  NONE  SPECIMEN:  No Specimen  DISPOSITION OF SPECIMEN:  N/A  COUNTS:  YES   DICTATION: .Dragon Dictation  PLAN OF CARE: Admit to inpatient   PATIENT DISPOSITION:  ICU - intubated and hemodynamically stable.   Delay start of Pharmacological VTE agent (>24hrs) due to surgical blood loss or risk of bleeding: yes

## 2013-07-01 NOTE — Anesthesia Postprocedure Evaluation (Signed)
  Anesthesia Post-op Note  Patient: Lance Collier  Procedure(s) Performed: Procedure(s) with comments: CORONARY ARTERY BYPASS GRAFTING (CABG) (N/A) - Times 3 using left internal mammary artery and endoscopically harvested bilateral saphenous vein INTRAOPERATIVE TRANSESOPHAGEAL ECHOCARDIOGRAM (N/A)  Patient Location: ICU  Anesthesia Type:General  Level of Consciousness: sedated  Airway and Oxygen Therapy: Patient remains intubated per anesthesia plan  Post-op Pain: none  Post-op Assessment: Post-op Vital signs reviewed, Patient's Cardiovascular Status Stable and Respiratory Function Stable  Post-op Vital Signs: Reviewed and stable  Complications: No apparent anesthesia complications

## 2013-07-01 NOTE — Progress Notes (Signed)
Echocardiogram Echocardiogram Transesophageal has been performed.  Lance Collier 07/01/2013, 8:25 AM

## 2013-07-02 ENCOUNTER — Inpatient Hospital Stay (HOSPITAL_COMMUNITY): Payer: Medicare Other

## 2013-07-02 DIAGNOSIS — Z01818 Encounter for other preprocedural examination: Secondary | ICD-10-CM

## 2013-07-02 LAB — POCT I-STAT, CHEM 8
BUN: 29 mg/dL — ABNORMAL HIGH (ref 6–23)
CALCIUM ION: 1.17 mmol/L (ref 1.13–1.30)
Chloride: 108 mEq/L (ref 96–112)
Creatinine, Ser: 2.4 mg/dL — ABNORMAL HIGH (ref 0.50–1.35)
Glucose, Bld: 166 mg/dL — ABNORMAL HIGH (ref 70–99)
HEMATOCRIT: 29 % — AB (ref 39.0–52.0)
HEMOGLOBIN: 9.9 g/dL — AB (ref 13.0–17.0)
Potassium: 4.8 mEq/L (ref 3.7–5.3)
SODIUM: 136 meq/L — AB (ref 137–147)
TCO2: 25 mmol/L (ref 0–100)

## 2013-07-02 LAB — GLUCOSE, CAPILLARY
GLUCOSE-CAPILLARY: 99 mg/dL (ref 70–99)
GLUCOSE-CAPILLARY: 119 mg/dL — AB (ref 70–99)
GLUCOSE-CAPILLARY: 103 mg/dL — AB (ref 70–99)
GLUCOSE-CAPILLARY: 136 mg/dL — AB (ref 70–99)
GLUCOSE-CAPILLARY: 140 mg/dL — AB (ref 70–99)
GLUCOSE-CAPILLARY: 144 mg/dL — AB (ref 70–99)
Glucose-Capillary: 105 mg/dL — ABNORMAL HIGH (ref 70–99)
Glucose-Capillary: 109 mg/dL — ABNORMAL HIGH (ref 70–99)
Glucose-Capillary: 109 mg/dL — ABNORMAL HIGH (ref 70–99)
Glucose-Capillary: 109 mg/dL — ABNORMAL HIGH (ref 70–99)
Glucose-Capillary: 111 mg/dL — ABNORMAL HIGH (ref 70–99)
Glucose-Capillary: 98 mg/dL (ref 70–99)
Glucose-Capillary: 99 mg/dL (ref 70–99)
Glucose-Capillary: 127 mg/dL — ABNORMAL HIGH (ref 70–99)
Glucose-Capillary: 128 mg/dL — ABNORMAL HIGH (ref 70–99)
Glucose-Capillary: 128 mg/dL — ABNORMAL HIGH (ref 70–99)
Glucose-Capillary: 228 mg/dL — ABNORMAL HIGH (ref 70–99)
Glucose-Capillary: 158 mg/dL — ABNORMAL HIGH (ref 70–99)

## 2013-07-02 LAB — BASIC METABOLIC PANEL
BUN: 26 mg/dL — ABNORMAL HIGH (ref 6–23)
CO2: 23 meq/L (ref 19–32)
Calcium: 8.5 mg/dL (ref 8.4–10.5)
Chloride: 107 mEq/L (ref 96–112)
Creatinine, Ser: 2.06 mg/dL — ABNORMAL HIGH (ref 0.50–1.35)
GFR calc Af Amer: 36 mL/min — ABNORMAL LOW (ref >90)
GFR calc non Af Amer: 31 mL/min — ABNORMAL LOW (ref >90)
Glucose, Bld: 112 mg/dL — ABNORMAL HIGH (ref 70–99)
POTASSIUM: 4.6 meq/L (ref 3.7–5.3)
SODIUM: 141 meq/L (ref 137–147)

## 2013-07-02 LAB — CBC
HCT: 30.1 % — ABNORMAL LOW (ref 39.0–52.0)
HEMATOCRIT: 29.5 % — AB (ref 39.0–52.0)
HEMOGLOBIN: 10 g/dL — AB (ref 13.0–17.0)
Hemoglobin: 9.6 g/dL — ABNORMAL LOW (ref 13.0–17.0)
MCH: 28.4 pg (ref 26.0–34.0)
MCH: 28.3 pg (ref 26.0–34.0)
MCHC: 33.2 g/dL (ref 30.0–36.0)
MCHC: 32.5 g/dL (ref 30.0–36.0)
MCV: 85.5 fL (ref 78.0–100.0)
MCV: 87 fL (ref 78.0–100.0)
PLATELETS: 162 10*3/uL (ref 150–400)
PLATELETS: 145 10*3/uL — AB (ref 150–400)
RBC: 3.52 MIL/uL — AB (ref 4.22–5.81)
RBC: 3.39 MIL/uL — AB (ref 4.22–5.81)
RDW: 13.1 % (ref 11.5–15.5)
RDW: 13.5 % (ref 11.5–15.5)
WBC: 8.3 10*3/uL (ref 4.0–10.5)
WBC: 10.5 10*3/uL (ref 4.0–10.5)

## 2013-07-02 LAB — CREATININE, SERUM
Creatinine, Ser: 2.22 mg/dL — ABNORMAL HIGH (ref 0.50–1.35)
GFR calc non Af Amer: 28 mL/min — ABNORMAL LOW (ref >90)
GFR, EST AFRICAN AMERICAN: 33 mL/min — AB (ref >90)

## 2013-07-02 LAB — MAGNESIUM
MAGNESIUM: 2.7 mg/dL — AB (ref 1.5–2.5)
Magnesium: 2.5 mg/dL (ref 1.5–2.5)

## 2013-07-02 MED ORDER — POLYETHYL GLYCOL-PROPYL GLYCOL 0.4-0.3 % OP SOLN: 1.0000 [drp] | Freq: Every day | OPHTHALMIC | Status: DC | PRN

## 2013-07-02 MED ORDER — ENOXAPARIN SODIUM 30 MG/0.3ML ~~LOC~~ SOLN
30.0000 mg | Freq: Every day | SUBCUTANEOUS | Status: DC
  Administered 2013-07-02 – 2013-07-05 (×4): 30 mg via SUBCUTANEOUS
  Filled 2013-07-02 (×5): qty 0.3

## 2013-07-02 MED ORDER — FUROSEMIDE 10 MG/ML IJ SOLN
40.0000 mg | Freq: Once | INTRAMUSCULAR | Status: DC
  Filled 2013-07-02: qty 4

## 2013-07-02 MED ORDER — INSULIN DETEMIR 100 UNIT/ML ~~LOC~~ SOLN
40.0000 [IU] | Freq: Every day | SUBCUTANEOUS | Status: DC
  Administered 2013-07-03: 40 [IU] via SUBCUTANEOUS
  Filled 2013-07-02: qty 0.4

## 2013-07-02 MED ORDER — INSULIN ASPART 100 UNIT/ML ~~LOC~~ SOLN
0.0000 [IU] | SUBCUTANEOUS | Status: DC
  Administered 2013-07-02 (×3): 2 [IU] via SUBCUTANEOUS
  Administered 2013-07-02 – 2013-07-03 (×6): 4 [IU] via SUBCUTANEOUS
  Administered 2013-07-04 (×2): 2 [IU] via SUBCUTANEOUS
  Administered 2013-07-04: 4 [IU] via SUBCUTANEOUS

## 2013-07-02 MED ORDER — POLYVINYL ALCOHOL 1.4 % OP SOLN
1.0000 [drp] | OPHTHALMIC | Status: DC | PRN
  Filled 2013-07-02: qty 15

## 2013-07-02 MED ORDER — INSULIN DETEMIR 100 UNIT/ML ~~LOC~~ SOLN
40.0000 [IU] | Freq: Once | SUBCUTANEOUS | Status: AC
  Administered 2013-07-02: 40 [IU] via SUBCUTANEOUS
  Filled 2013-07-02: qty 0.4

## 2013-07-02 NOTE — Progress Notes (Signed)
1 Day Post-Op Procedure(s) (LRB): CORONARY ARTERY BYPASS GRAFTING (CABG) (N/A) INTRAOPERATIVE TRANSESOPHAGEAL ECHOCARDIOGRAM (N/A) Subjective: C/o incisional pain Denies nausea  Objective: Vital signs in last 24 hours: Temp:  [98.2 F (36.8 C)-100 F (37.8 C)] 98.6 F (37 C) (01/10 0900) Pulse Rate:  [59-90] 68 (01/10 0900) Cardiac Rhythm:  [-] Normal sinus rhythm (01/10 0800) Resp:  [14-27] 16 (01/10 0900) BP: (90-140)/(43-108) 130/57 mmHg (01/10 0900) SpO2:  [93 %-100 %] 96 % (01/10 0900) Arterial Line BP: (58-165)/(25-73) 58/50 mmHg (01/10 0900) FiO2 (%):  [40 %-50 %] 40 % (01/09 1640) Weight:  [206 lb 4.8 oz (93.577 kg)] 206 lb 4.8 oz (93.577 kg) (01/10 0500)  Hemodynamic parameters for last 24 hours: PAP: (24-39)/(9-23) 27/9 mmHg CO:  [4 L/min-6.2 L/min] 6.2 L/min CI:  [1.9 L/min/m2-3 L/min/m2] 3 L/min/m2  Intake/Output from previous day: 01/09 0701 - 01/10 0700 In: 4317.8 [P.O.:50; I.V.:3322.8; Blood:595; IV Piggyback:350] Out: 4560 [Urine:2775; Blood:1625; Chest Tube:160] Intake/Output this shift:    General appearance: alert and mild distress Neurologic: intact Heart: regular rate and rhythm Lungs: diminished breath sounds bibasilar Abdomen: distended, nontender, + BS  Lab Results:  Recent Labs  07/01/13 1900 07/01/13 1915 07/02/13 0402  WBC 9.2  --  8.3  HGB 10.3* 13.3 10.0*  HCT 31.7* 39.0 30.1*  PLT 162  --  162   BMET:  Recent Labs  07/01/13 0305  07/01/13 1915 07/02/13 0402  NA 140  < > 139 141  K 4.0  < > 4.2 4.6  CL 104  --  104 107  CO2 24  --   --  23  GLUCOSE 161*  < > 141* 112*  BUN 31*  --  24* 26*  CREATININE 2.09*  < > 2.00* 2.06*  CALCIUM 9.4  --   --  8.5  < > = values in this interval not displayed.  PT/INR:  Recent Labs  07/01/13 1345  LABPROT 15.7*  INR 1.28   ABG    Component Value Date/Time   PHART 7.321* 07/01/2013 1907   HCO3 23.6 07/01/2013 1907   TCO2 24 07/01/2013 1915   ACIDBASEDEF 2.0 07/01/2013 1907   O2SAT  95.0 07/01/2013 1907   CBG (last 3)   Recent Labs  07/02/13 0358 07/02/13 0501 07/02/13 0545  GLUCAP 109* 109* 111*    Assessment/Plan: S/P Procedure(s) (LRB): CORONARY ARTERY BYPASS GRAFTING (CABG) (N/A) INTRAOPERATIVE TRANSESOPHAGEAL ECHOCARDIOGRAM (N/A) POD # 1 CV- good hemodynamics- dc swan  Continue dopamine for 24 hours  RESP- bibasilar atlectasis L > R  RENAL- lytes ok, creatinine at baseline  Lasix IV this AM  ENDO- CBG well controlled, transition from drip to levemir + SSI  Anemia secondary to ABL- mild, follow  DC CT  OOB, ambulate  Enoxaparin + SCD for DVT prophylaxis  LOS: 4 days    Lance Collier C 07/02/2013

## 2013-07-02 NOTE — Progress Notes (Signed)
Resting comfortably  BP 99/82  Pulse 65  Temp(Src) 97.5 F (36.4 C) (Oral)  Resp 14  Ht 5\' 9"  (1.753 m)  Wt 206 lb 4.8 oz (93.577 kg)  BMI 30.45 kg/m2  SpO2 94%   Intake/Output Summary (Last 24 hours) at 07/02/13 1734 Last data filed at 07/02/13 1500  Gross per 24 hour  Intake 1755.6 ml  Output   1720 ml  Net   35.6 ml   CBG in 140s  Cr up slightly to 2.4, K 4.8, Hct 29  Doing well POD # 1 CABG  Continue dopamine

## 2013-07-03 ENCOUNTER — Inpatient Hospital Stay (HOSPITAL_COMMUNITY): Payer: Medicare Other

## 2013-07-03 LAB — CBC
HCT: 26.9 % — ABNORMAL LOW (ref 39.0–52.0)
Hemoglobin: 8.7 g/dL — ABNORMAL LOW (ref 13.0–17.0)
MCH: 28.4 pg (ref 26.0–34.0)
MCHC: 32.3 g/dL (ref 30.0–36.0)
MCV: 87.9 fL (ref 78.0–100.0)
Platelets: 134 10*3/uL — ABNORMAL LOW (ref 150–400)
RBC: 3.06 MIL/uL — AB (ref 4.22–5.81)
RDW: 13.6 % (ref 11.5–15.5)
WBC: 8.9 10*3/uL (ref 4.0–10.5)

## 2013-07-03 LAB — GLUCOSE, CAPILLARY
GLUCOSE-CAPILLARY: 171 mg/dL — AB (ref 70–99)
GLUCOSE-CAPILLARY: 172 mg/dL — AB (ref 70–99)
GLUCOSE-CAPILLARY: 183 mg/dL — AB (ref 70–99)
Glucose-Capillary: 172 mg/dL — ABNORMAL HIGH (ref 70–99)
Glucose-Capillary: 196 mg/dL — ABNORMAL HIGH (ref 70–99)

## 2013-07-03 LAB — BASIC METABOLIC PANEL
BUN: 34 mg/dL — ABNORMAL HIGH (ref 6–23)
CALCIUM: 8.2 mg/dL — AB (ref 8.4–10.5)
CO2: 27 mEq/L (ref 19–32)
Chloride: 102 mEq/L (ref 96–112)
Creatinine, Ser: 1.91 mg/dL — ABNORMAL HIGH (ref 0.50–1.35)
GFR, EST AFRICAN AMERICAN: 40 mL/min — AB (ref >90)
GFR, EST NON AFRICAN AMERICAN: 34 mL/min — AB (ref >90)
Glucose, Bld: 179 mg/dL — ABNORMAL HIGH (ref 70–99)
POTASSIUM: 4.8 meq/L (ref 3.7–5.3)
SODIUM: 139 meq/L (ref 137–147)

## 2013-07-03 MED ORDER — INSULIN DETEMIR 100 UNIT/ML ~~LOC~~ SOLN
40.0000 [IU] | Freq: Two times a day (BID) | SUBCUTANEOUS | Status: DC
  Administered 2013-07-03 – 2013-07-07 (×8): 40 [IU] via SUBCUTANEOUS
  Filled 2013-07-03 (×9): qty 0.4

## 2013-07-03 MED ORDER — FUROSEMIDE 10 MG/ML IJ SOLN
40.0000 mg | Freq: Once | INTRAMUSCULAR | Status: AC
  Administered 2013-07-03: 40 mg via INTRAVENOUS
  Filled 2013-07-03: qty 4

## 2013-07-03 NOTE — Progress Notes (Signed)
POD # 2  Asleep in chair at present  BP 106/75  Pulse 73  Temp(Src) 97.7 F (36.5 C) (Oral)  Resp 19  Ht 5\' 9"  (1.753 m)  Wt 206 lb 9.1 oz (93.7 kg)  BMI 30.49 kg/m2  SpO2 95%   Intake/Output Summary (Last 24 hours) at 07/03/13 1937 Last data filed at 07/03/13 1800  Gross per 24 hour  Intake 1238.8 ml  Output   1735 ml  Net -496.2 ml   Good UO  Off dopamine  Recheck creatinine in AM

## 2013-07-03 NOTE — Progress Notes (Signed)
2 Days Post-Op Procedure(s) (LRB): CORONARY ARTERY BYPASS GRAFTING (CABG) (N/A) INTRAOPERATIVE TRANSESOPHAGEAL ECHOCARDIOGRAM (N/A) Subjective: Feels "OK" No appetite Denies nausea and pain  Objective: Vital signs in last 24 hours: Temp:  [97.5 F (36.4 C)-98.2 F (36.8 C)] 97.6 F (36.4 C) (01/11 1137) Pulse Rate:  [63-81] 73 (01/11 1200) Cardiac Rhythm:  [-] Normal sinus rhythm (01/11 1200) Resp:  [8-23] 18 (01/11 1200) BP: (99-157)/(43-82) 146/49 mmHg (01/11 1200) SpO2:  [93 %-97 %] 94 % (01/11 1200) Arterial Line BP: (125)/(60) 125/60 mmHg (01/10 1300) Weight:  [206 lb 9.1 oz (93.7 kg)] 206 lb 9.1 oz (93.7 kg) (01/11 0500)  Hemodynamic parameters for last 24 hours:    Intake/Output from previous day: 01/10 0701 - 01/11 0700 In: 1142.9 [P.O.:340; I.V.:702.9; IV Piggyback:100] Out: [id number] [Urine:1725; Chest Tube:10] Intake/Output this shift: Total I/O In: 416 [P.O.:240; I.V.:126; IV Piggyback:50] Out: 225 [Urine:225]  General appearance: alert and no distress Neurologic: intact Heart: regular rate and rhythm Lungs: diminished breath sounds bibasilar Abdomen: normal findings: mildly distended, nontender, + hypoactive BS  Lab Results:  Recent Labs  07/02/13 1650 07/03/13 0420  WBC 10.5 8.9  HGB 9.6* 8.7*  HCT 29.5* 26.9*  PLT 145* 134*   BMET:  Recent Labs  07/02/13 0402 07/02/13 1636 07/02/13 1650 07/03/13 0420  NA 141 136*  --  139  K 4.6 4.8  --  4.8  CL 107 108  --  102  CO2 23  --   --  27  GLUCOSE 112* 166*  --  179*  BUN 26* 29*  --  34*  CREATININE 2.06* 2.40* 2.22* 1.91*  CALCIUM 8.5  --   --  8.2*    PT/INR:  Recent Labs  07/01/13 1345  LABPROT 15.7*  INR 1.28   ABG    Component Value Date/Time   PHART 7.321* 07/01/2013 1907   HCO3 23.6 07/01/2013 1907   TCO2 25 07/02/2013 1636   ACIDBASEDEF 2.0 07/01/2013 1907   O2SAT 95.0 07/01/2013 1907   CBG (last 3)   Recent Labs  07/02/13 2348 07/03/13 0743 07/03/13 1135  GLUCAP 172* 172*  196*    Assessment/Plan: S/P Procedure(s) (LRB): CORONARY ARTERY BYPASS GRAFTING (CABG) (N/A) INTRAOPERATIVE TRANSESOPHAGEAL ECHOCARDIOGRAM (N/A) - CV- stable  RESP- bibasilar atelectasis- continue IS  RENAL- creatinine 1.9 this AM  Wean dopamine, diurese, dc foley  ENDO- CBG elevated, increase levemir  Continue ambulation   LOS: 5 days    HENDRICKSON,STEVEN C 07/03/2013

## 2013-07-04 ENCOUNTER — Encounter (HOSPITAL_COMMUNITY): Payer: Self-pay | Admitting: Cardiothoracic Surgery

## 2013-07-04 LAB — BASIC METABOLIC PANEL
BUN: 39 mg/dL — ABNORMAL HIGH (ref 6–23)
CHLORIDE: 97 meq/L (ref 96–112)
CO2: 26 mEq/L (ref 19–32)
Calcium: 8.7 mg/dL (ref 8.4–10.5)
Creatinine, Ser: 2.05 mg/dL — ABNORMAL HIGH (ref 0.50–1.35)
GFR calc non Af Amer: 31 mL/min — ABNORMAL LOW (ref >90)
GFR, EST AFRICAN AMERICAN: 36 mL/min — AB (ref >90)
Glucose, Bld: 127 mg/dL — ABNORMAL HIGH (ref 70–99)
POTASSIUM: 4.1 meq/L (ref 3.7–5.3)
SODIUM: 134 meq/L — AB (ref 137–147)

## 2013-07-04 LAB — TYPE AND SCREEN
ABO/RH(D): B POS
Antibody Screen: NEGATIVE
Unit division: 0
Unit division: 0
Unit division: 0
Unit division: 0

## 2013-07-04 LAB — CBC
HCT: 23.1 % — ABNORMAL LOW (ref 39.0–52.0)
HEMOGLOBIN: 7.6 g/dL — AB (ref 13.0–17.0)
MCH: 28.7 pg (ref 26.0–34.0)
MCHC: 32.9 g/dL (ref 30.0–36.0)
MCV: 87.2 fL (ref 78.0–100.0)
Platelets: 131 10*3/uL — ABNORMAL LOW (ref 150–400)
RBC: 2.65 MIL/uL — AB (ref 4.22–5.81)
RDW: 13.7 % (ref 11.5–15.5)
WBC: 6.6 10*3/uL (ref 4.0–10.5)

## 2013-07-04 LAB — GLUCOSE, CAPILLARY
GLUCOSE-CAPILLARY: 135 mg/dL — AB (ref 70–99)
GLUCOSE-CAPILLARY: 219 mg/dL — AB (ref 70–99)
Glucose-Capillary: 121 mg/dL — ABNORMAL HIGH (ref 70–99)
Glucose-Capillary: 162 mg/dL — ABNORMAL HIGH (ref 70–99)
Glucose-Capillary: 170 mg/dL — ABNORMAL HIGH (ref 70–99)
Glucose-Capillary: 186 mg/dL — ABNORMAL HIGH (ref 70–99)
Glucose-Capillary: 245 mg/dL — ABNORMAL HIGH (ref 70–99)

## 2013-07-04 MED ORDER — METOPROLOL TARTRATE 12.5 MG HALF TABLET
12.5000 mg | ORAL_TABLET | Freq: Two times a day (BID) | ORAL | Status: DC
  Administered 2013-07-04 – 2013-07-07 (×6): 12.5 mg via ORAL
  Filled 2013-07-04 (×7): qty 1

## 2013-07-04 MED ORDER — PANTOPRAZOLE SODIUM 40 MG PO TBEC
40.0000 mg | DELAYED_RELEASE_TABLET | Freq: Every day | ORAL | Status: DC
  Administered 2013-07-05 – 2013-07-07 (×3): 40 mg via ORAL
  Filled 2013-07-04 (×3): qty 1

## 2013-07-04 MED ORDER — ONDANSETRON HCL 4 MG/2ML IJ SOLN
4.0000 mg | Freq: Four times a day (QID) | INTRAMUSCULAR | Status: DC | PRN
  Administered 2013-07-04: 4 mg via INTRAVENOUS
  Filled 2013-07-04: qty 2

## 2013-07-04 MED ORDER — TRAMADOL HCL 50 MG PO TABS
50.0000 mg | ORAL_TABLET | ORAL | Status: DC | PRN
  Administered 2013-07-04 – 2013-07-07 (×8): 100 mg via ORAL
  Filled 2013-07-04 (×9): qty 2

## 2013-07-04 MED ORDER — BISACODYL 10 MG RE SUPP: 10.0000 mg | Freq: Every day | RECTAL | Status: DC | PRN

## 2013-07-04 MED ORDER — SODIUM CHLORIDE 0.9 % IJ SOLN
3.0000 mL | Freq: Two times a day (BID) | INTRAMUSCULAR | Status: DC
  Administered 2013-07-04 – 2013-07-06 (×6): 3 mL via INTRAVENOUS

## 2013-07-04 MED ORDER — INSULIN ASPART 100 UNIT/ML ~~LOC~~ SOLN
0.0000 [IU] | Freq: Three times a day (TID) | SUBCUTANEOUS | Status: DC
  Administered 2013-07-04: 8 [IU] via SUBCUTANEOUS
  Administered 2013-07-04: 4 [IU] via SUBCUTANEOUS
  Administered 2013-07-04: 8 [IU] via SUBCUTANEOUS
  Administered 2013-07-05: 4 [IU] via SUBCUTANEOUS
  Administered 2013-07-05 (×3): 8 [IU] via SUBCUTANEOUS
  Administered 2013-07-06: 4 [IU] via SUBCUTANEOUS
  Administered 2013-07-06: 22:00:00 via SUBCUTANEOUS
  Administered 2013-07-06 (×2): 8 [IU] via SUBCUTANEOUS
  Administered 2013-07-07: 2 [IU] via SUBCUTANEOUS

## 2013-07-04 MED ORDER — ASPIRIN EC 325 MG PO TBEC
325.0000 mg | DELAYED_RELEASE_TABLET | Freq: Every day | ORAL | Status: DC
  Administered 2013-07-04 – 2013-07-07 (×4): 325 mg via ORAL
  Filled 2013-07-04 (×4): qty 1

## 2013-07-04 MED ORDER — BISACODYL 5 MG PO TBEC
10.0000 mg | DELAYED_RELEASE_TABLET | Freq: Every day | ORAL | Status: DC | PRN
  Filled 2013-07-04 (×2): qty 2

## 2013-07-04 MED ORDER — FUROSEMIDE 40 MG PO TABS
40.0000 mg | ORAL_TABLET | Freq: Every day | ORAL | Status: AC
  Administered 2013-07-04 – 2013-07-06 (×3): 40 mg via ORAL
  Filled 2013-07-04 (×3): qty 1

## 2013-07-04 MED ORDER — ONDANSETRON HCL 4 MG PO TABS: 4.0000 mg | ORAL_TABLET | Freq: Four times a day (QID) | ORAL | Status: DC | PRN

## 2013-07-04 MED ORDER — SODIUM CHLORIDE 0.9 % IV SOLN: 250.0000 mL | INTRAVENOUS | Status: DC | PRN

## 2013-07-04 MED ORDER — SODIUM CHLORIDE 0.9 % IJ SOLN: 3.0000 mL | INTRAMUSCULAR | Status: DC | PRN

## 2013-07-04 MED ORDER — FOLIC ACID 1 MG PO TABS
1.0000 mg | ORAL_TABLET | Freq: Every day | ORAL | Status: DC
  Administered 2013-07-04 – 2013-07-07 (×4): 1 mg via ORAL
  Filled 2013-07-04 (×4): qty 1

## 2013-07-04 MED ORDER — MOVING RIGHT ALONG BOOK
Freq: Once | Status: AC
  Administered 2013-07-04: 15:00:00
  Filled 2013-07-04: qty 1

## 2013-07-04 MED ORDER — DOCUSATE SODIUM 100 MG PO CAPS
200.0000 mg | ORAL_CAPSULE | Freq: Every day | ORAL | Status: DC
  Administered 2013-07-04 – 2013-07-07 (×4): 200 mg via ORAL
  Filled 2013-07-04 (×4): qty 2

## 2013-07-04 MED FILL — Magnesium Sulfate Inj 50%: INTRAMUSCULAR | Qty: 10 | Status: AC

## 2013-07-04 MED FILL — Heparin Sodium (Porcine) Inj 1000 Unit/ML: INTRAMUSCULAR | Qty: 30 | Status: AC

## 2013-07-04 MED FILL — Potassium Chloride Inj 2 mEq/ML: INTRAVENOUS | Qty: 40 | Status: AC

## 2013-07-04 NOTE — Op Note (Signed)
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  POSTOPERATIVE DIAGNOSIS:  Same  SURGICAL PROCEDURE:  Coronary artery bypass grafting x3 with left internal mammary to the left anterior descending coronary artery, reverse saphenous vein graft to the obtuse marginal coronary artery, reverse saphenous vein graft to the distal right coronary artery with bilateral thigh endo vein harvesting.  SURGEON:  Lanelle Bal, MD  FIRST ASSISTANT:  Providence Crosby, PA.  BRIEF HISTORY:  The patient is a 68 year old diabetic male with significant diabetic retinopathy, chronic renal insufficiency with baseline creatinine of 2.0, cerebrovascular disease and peripheral vascular disease, who presents with increasing symptoms of unstable angina.  He was being seen by his ophthalmologist and because of symptoms suggestive of angina, he was referred directly to Cardiology. He previously had been followed by Dr. Rex Kras who had placed a stent in his right coronary artery several years ago.  He was seen by Dr. Ellyn Hack and a cardiac catheterization was performed, which demonstrated ostial left main disease, preserved LV function, and some in-stent stenosis of the right coronary artery stent with haziness evident.  He had previously been on Plavix but because of retinal hemorrhages was no longer on Plavix.  With the patient's ostial left main disease, he was referred for consideration of coronary artery bypass grafting and as it is the best alternative.  He is a known diabetic with a hemoglobin A1c recently obtained.  Risks and options were discussed with the patient in detail,  including his increased risk because of cerebrovascular, peripheral vascular, and chronic renal disease.  He was agreeable with proceeding and signed informed consent.  DESCRIPTION OF PROCEDURE:  With Swan-Ganz and arterial line monitors in place, the patient underwent general endotracheal anesthesia without incident.  Skin of the chest and legs was prepped with Betadine and draped in usual sterile manner.  TEE probe was placed by Anesthesia, details are under a separate note.  He had very trace mitral regurgitation, but overall preserved left ventricular function. Initially, using the [employer] endo vein harvesting system, vein was harvested from the right thigh, however, one usable segment of vein was obtained from the thigh, but as it went below the knee, it was a very poor quality.  Additional vein was harvested in a similar fashion from the left thigh and also with similar nature that one usable segment was obtained.  A median sternotomy was performed and left internal mammary artery was dissected down as pedicle graft.  The distal artery was divided and had good free flow.  The vessel was hydrostatically dilated with heparinized saline.  The patient was systemically heparinized.  The ascending aorta was cannulated.  The right atrium was cannulated and aortic root vent cardioplegia needle was introduced into the ascending aorta.  The patient was placed on cardiopulmonary bypass at 2.4 L/min/m2.  Sites of anastomosis were selected and dissected out  of epicardium.  The patient's body temperature was cooled to 32 degrees. While on cardiopulmonary bypass, he was maintained on low-dose dopamine and had adequate urine output throughout the pump run.  Aortic cross- clamp was applied and 500 mL of cold blood potassium cardioplegia was administered with diastolic arrest.  Myocardial septal temperature was monitored throughout the cross-clamp period.  Attention was turned first to the distal  right coronary artery which was opened, was a diffusely diseased vessel, somewhat thickened posteriorly, but the anterior wall was suitable for opening.  A 1.5-mm probe passed proximally and distally.  Using a running 7-0 Prolene, a segment of reverse saphenous vein graft was anastomosed to the distal right coronary artery. Additional cold blood cardioplegia was administered down the vein graft. The heart was then elevated and the first obtuse marginal which was the predominant supplier of the lateral wall was opened in its intramyocardial position.  I admitted a 1.5-mm probe distally.  There was no significant disease between this and the distal circumflex.  The distal circumflex as it arose from the AV groove was very small vessel . Using a running 7-0 Prolene, a distal anastomosis was performed to the obtuse marginal vessel.  Additional cold blood cardioplegia was administered down the vein graft.  Attention was then turned to the left anterior descending coronary artery between the mid and distal third. The vessel was opened, admitted a 1.5-mm probe just barely proximally and a 1-mm probe distally.  Using a running 8-0 Prolene, left internal mammary artery was then anastomosed to left anterior descending coronary artery.  With release of the bulldog on the mammary artery, there was appropriate rise in myocardial septal temperature and bulldog was placed back on the mammary artery, pedicle tacked to the epicardium with the cross-clamp still in place.  Two  punch aortotomies were performed and each of the two vein grafts were anastomosed to the ascending aorta. Air was evacuated from the grafts and the ascending aorta.  The heart was allowed to passively fill and de-air.  The aortic anastomoses were completed and the aortic cross-clamp was removed with a total cross- clamp time of 68 minutes.  Prior to removing the cross-clamp, the bulldog on the mammary artery was removed, and there was  prompt rise in myocardial septal temperature.  The patient spontaneously converted to a sinus rhythm.  Sites of anastomosis were inspected and were free of bleeding.  The patient was then ventilated and weaned from cardiopulmonary bypass with a body temperature 37 degrees  He remained hemodynamically stable.  He was decannulated in usual fashion. Protamine sulfate was administered.  Atrial and ventricular pacing wires were applied.  Graft marker was applied.  With the operative field hemostatic, a left pleural tube and a Blake mediastinal drain were left in place.  Sternum was then closed with #6 stainless steel wire.  Fascia closed with interrupted 0 Vicryl, running 3-0 Vicryl in the subcutaneous tissue, 4-0 subcuticular stitch in skin edges.  Dry dressings were applied.  Sponge and needle count was reported as correct at the completion of procedure.  The patient tolerated the procedure without obvious complication and was transferred to Surgical Intensive Care Unit for further postoperative care.  Total pump time was 101 minutes.     Lanelle Bal, MD     EG/MEDQ  D:  07/03/2013  T:  07/04/2013  Job:  [id number]

## 2013-07-04 NOTE — Progress Notes (Signed)
Received cal from [facility] lab regarding HCG serum qualative lab order.  Lana advised that this lab result would only indicate positive or negative pregnancy.  Since pt is male order was canceled.

## 2013-07-04 NOTE — Progress Notes (Signed)
Pt c/o nausea; pt given IV Zofran at this time; will cont. To monitor.

## 2013-07-04 NOTE — Progress Notes (Signed)
Patient ID: Lance Collier, male   DOB: 05/18/1946, 68 y.o.   MRN: [id number] TCTS DAILY ICU PROGRESS NOTE                   [address]            [place] [address]          [phone]   3 Days Post-Op Procedure(s) (LRB): CORONARY ARTERY BYPASS GRAFTING (CABG) (N/A) INTRAOPERATIVE TRANSESOPHAGEAL ECHOCARDIOGRAM (N/A)  Total Length of Stay:  LOS: 6 days   Subjective: Feels well this am, walked in unit x2 last pm  Objective: Vital signs in last 24 hours: Temp:  [97.6 F (36.4 C)-98.9 F (37.2 C)] 97.8 F (36.6 C) (01/12 0400) Pulse Rate:  [64-78] 74 (01/12 0600) Cardiac Rhythm:  [-] Normal sinus rhythm (01/12 0730) Resp:  [13-19] 15 (01/12 0600) BP: (106-149)/(47-83) 132/56 mmHg (01/12 0600) SpO2:  [90 %-98 %] 92 % (01/12 0600) Weight:  [207 lb 7.3 oz (94.1 kg)] 207 lb 7.3 oz (94.1 kg) (01/12 0700)  Filed Weights   07/02/13 0500 07/03/13 0500 07/04/13 0700  Weight: 206 lb 4.8 oz (93.577 kg) 206 lb 9.1 oz (93.7 kg) 207 lb 7.3 oz (94.1 kg)    Weight change: 14.1 oz (0.4 kg)   Hemodynamic parameters for last 24 hours:    Intake/Output from previous day: 01/11 0701 - 01/12 0700 In: 886.4 [P.O.:600; I.V.:236.4; IV Piggyback:50] Out: 1740 [Urine:1740]  Intake/Output this shift:    Current Meds: Scheduled Meds: . acetaminophen  1,000 mg Oral Q6H   Or  . acetaminophen (TYLENOL) oral liquid 160 mg/5 mL  1,000 mg Per Tube Q6H  . aspirin EC  325 mg Oral Daily   Or  . aspirin  324 mg Per Tube Daily  . bisacodyl  10 mg Oral Daily   Or  . bisacodyl  10 mg Rectal Daily  . docusate sodium  200 mg Oral Daily  . enoxaparin (LOVENOX) injection  30 mg Subcutaneous QHS  . fenofibrate  160 mg Oral Daily  . insulin aspart  0-24 Units Subcutaneous Q4H  . insulin detemir  40 Units Subcutaneous BID  . metoprolol tartrate  12.5 mg Oral BID   Or  . metoprolol tartrate  12.5 mg Per Tube BID  . pantoprazole  40 mg Oral Daily  . rosuvastatin  20 mg Oral QPC  supper  . sodium chloride  3 mL Intravenous Q12H   Continuous Infusions: . sodium chloride 10 mL/hr at 07/01/13 1400  . sodium chloride    . sodium chloride Stopped (07/03/13 1700)  . dexmedetomidine Stopped (07/01/13 1645)  . DOPamine Stopped (07/03/13 1500)  . nitroGLYCERIN Stopped (07/01/13 1800)  . phenylephrine (NEO-SYNEPHRINE) Adult infusion Stopped (07/01/13 2300)   PRN Meds:.metoprolol, midazolam, morphine injection, ondansetron (ZOFRAN) IV, oxyCODONE, polyvinyl alcohol, sodium chloride  General appearance: alert and cooperative Neurologic: intact Heart: regular rate and rhythm, S1, S2 normal, no murmur, click, rub or gallop Lungs: clear to auscultation bilaterally Abdomen: soft, non-tender; bowel sounds normal; no masses,  no organomegaly Extremities: extremities normal, atraumatic, no cyanosis or edema and Homans sign is negative, no sign of DVT Wound: sternum stable  Lab Results: CBC: Recent Labs  07/03/13 0420 07/04/13 0452  WBC 8.9 6.6  HGB 8.7* 7.6*  HCT 26.9* 23.1*  PLT 134* 131*   BMET:  Recent Labs  07/03/13 0420 07/04/13 0452  NA 139 134*  K 4.8 4.1  CL 102 97  CO2 27 26  GLUCOSE 179* 127*  BUN 34* 39*  CREATININE 1.91* 2.05*  CALCIUM 8.2* 8.7    PT/INR:  Recent Labs  07/01/13 1345  LABPROT 15.7*  INR 1.28   Radiology: Dg Chest Port 1 View  07/03/2013   CLINICAL DATA:  Coronary bypass, postoperative exam  EXAM: PORTABLE CHEST - 1 VIEW  COMPARISON:  07/02/2013  FINDINGS: Swan-Ganz catheter, mediastinal drain, and left chest tube have all been removed. Right IJ vascular sheath remains stable in position. Coronary bypass changes noted. Low lung volumes persist with basilar atelectasis and small effusions. No pneumothorax. Stable exam.  IMPRESSION: Stable portable chest exam.  No pneumothorax.   Electronically Signed   By: Daryll Brod M.D.   On: 07/03/2013 09:03     Assessment/Plan: S/P Procedure(s) (LRB): CORONARY ARTERY BYPASS GRAFTING  (CABG) (N/A) INTRAOPERATIVE TRANSESOPHAGEAL ECHOCARDIOGRAM (N/A) Mobilize Diuresis Diabetes control d/c tubes/lines Plan for transfer to step-down: see transfer orders Expected Acute  Blood - loss Anemia     Mei Suits B 07/04/2013 7:49 AM

## 2013-07-04 NOTE — Clinical Social Work Psychosocial (Signed)
Patient has been transferred to 2W, room 24. Ambulated there from his room. VS stable prior and during the transfer. All mazagines taken with patient's spouse. Family at bedside. Report given to [place], Therapist, sports. Receiving RN in room. Furqan Gosselin, Therapist, sports.

## 2013-07-04 NOTE — Progress Notes (Signed)
Pt reports relief from Zofran; pt assisted to BR at this time to attempt to have a BM; will cont. To monitor.

## 2013-07-05 ENCOUNTER — Inpatient Hospital Stay (HOSPITAL_COMMUNITY): Payer: Medicare Other

## 2013-07-05 LAB — CBC
HCT: 23.4 % — ABNORMAL LOW (ref 39.0–52.0)
Hemoglobin: 7.8 g/dL — ABNORMAL LOW (ref 13.0–17.0)
MCH: 28.6 pg (ref 26.0–34.0)
MCHC: 33.3 g/dL (ref 30.0–36.0)
MCV: 85.7 fL (ref 78.0–100.0)
Platelets: 204 10*3/uL (ref 150–400)
RBC: 2.73 MIL/uL — ABNORMAL LOW (ref 4.22–5.81)
RDW: 13.8 % (ref 11.5–15.5)
WBC: 5.7 10*3/uL (ref 4.0–10.5)

## 2013-07-05 LAB — BASIC METABOLIC PANEL
BUN: 46 mg/dL — ABNORMAL HIGH (ref 6–23)
CO2: 29 mEq/L (ref 19–32)
Calcium: 8.6 mg/dL (ref 8.4–10.5)
Chloride: 95 mEq/L — ABNORMAL LOW (ref 96–112)
Creatinine, Ser: 2.16 mg/dL — ABNORMAL HIGH (ref 0.50–1.35)
GFR calc Af Amer: 34 mL/min — ABNORMAL LOW (ref >90)
GFR calc non Af Amer: 29 mL/min — ABNORMAL LOW (ref >90)
Glucose, Bld: 170 mg/dL — ABNORMAL HIGH (ref 70–99)
Potassium: 4.5 mEq/L (ref 3.7–5.3)
Sodium: 134 mEq/L — ABNORMAL LOW (ref 137–147)

## 2013-07-05 LAB — GLUCOSE, CAPILLARY
GLUCOSE-CAPILLARY: 177 mg/dL — AB (ref 70–99)
Glucose-Capillary: 220 mg/dL — ABNORMAL HIGH (ref 70–99)
Glucose-Capillary: 214 mg/dL — ABNORMAL HIGH (ref 70–99)
Glucose-Capillary: 241 mg/dL — ABNORMAL HIGH (ref 70–99)

## 2013-07-05 MED ORDER — INSULIN ASPART 100 UNIT/ML ~~LOC~~ SOLN
5.0000 [IU] | Freq: Three times a day (TID) | SUBCUTANEOUS | Status: DC
  Administered 2013-07-05: 5 [IU] via SUBCUTANEOUS

## 2013-07-05 MED ORDER — AMLODIPINE BESYLATE 5 MG PO TABS
5.0000 mg | ORAL_TABLET | Freq: Every day | ORAL | Status: DC
  Administered 2013-07-05: 5 mg via ORAL
  Filled 2013-07-05 (×2): qty 1

## 2013-07-05 NOTE — Progress Notes (Signed)
Pt ambulated around unit 200 ft with standby assist on RA. Pt tolerated ambulation well with no pain and steady gait.

## 2013-07-05 NOTE — Progress Notes (Signed)
Pt encouraged to ambulate again after eating this evening; family in room; pt stated he would ambulate with [place] wife; will cont. To monitor.

## 2013-07-05 NOTE — Progress Notes (Signed)
Pt up ambulating in hallway at this time with wife; steady gait noted; no walker needed; will cont. To monitor.

## 2013-07-05 NOTE — Progress Notes (Signed)
Inpatient Diabetes Program Recommendations  AACE/ADA: New Consensus Statement on Inpatient Glycemic Control (2013)  Target Ranges:  Prepandial:   less than 140 mg/dL      Peak postprandial:   less than 180 mg/dL (1-2 hours)      Critically ill patients:  140 - 180 mg/dL   Results for Lance Collier, Lance Collier (MRN [id number]) as of 07/05/2013 11:46  Ref. Range 07/04/2013 08:25 07/04/2013 12:16 07/04/2013 16:22 07/04/2013 22:32 07/05/2013 06:17 07/05/2013 11:22  Glucose-Capillary Latest Range: 70-99 mg/dL 135 (H) 245 (H) 219 (H) 186 (H) 177 (H) 220 (H)   Inpatient Diabetes Program Recommendations Insulin - Meal Coverage: Please consider ordering Novolog 5 units TID with meals for meal coverage (as long as patient eats at least 50% of meal).  Note: Post-prandial glucose consistently elevated.  Please order Novolog meal coverage as requested.   Thanks, Barnie Alderman, RN, MSN, CCRN Diabetes Coordinator Inpatient Diabetes Program [phone] (Team Pager) [phone] ([facility] office) [phone] [facility] office)

## 2013-07-05 NOTE — Progress Notes (Signed)
EPW d/c at this time per MD order; VSS; family at bedside; bedrest until 1404; will cont. To monitor.

## 2013-07-05 NOTE — Progress Notes (Addendum)
      [address]       [place] [address]             [phone]      4 Days Post-Op Procedure(s) (LRB): CORONARY ARTERY BYPASS GRAFTING (CABG) (N/A) INTRAOPERATIVE TRANSESOPHAGEAL ECHOCARDIOGRAM (N/A)  Subjective:  Lance Collier has no complaints this morning.  He states he feel better today than he has since surgery.  + ambulation, + BM  Objective: Vital signs in last 24 hours: Temp:  [98 F (36.7 C)-98.7 F (37.1 C)] 98.5 F (36.9 C) (01/13 0420) Pulse Rate:  [66-86] 66 (01/13 1035) Cardiac Rhythm:  [-] Normal sinus rhythm (01/13 0815) Resp:  [14-18] 18 (01/13 0420) BP: (109-149)/(53-75) 135/53 mmHg (01/13 1035) SpO2:  [88 %-94 %] 92 % (01/13 0552) Weight:  [206 lb 9.1 oz (93.7 kg)] 206 lb 9.1 oz (93.7 kg) (01/13 0500)  Intake/Output from previous day: 01/12 0701 - 01/13 0700 In: 960 [P.O.:960] Out: 1350 [Urine:1350] Intake/Output this shift: Total I/O In: 240 [P.O.:240] Out: -   General appearance: alert, cooperative and no distress Heart: regular rate and rhythm Lungs: clear to auscultation bilaterally Abdomen: soft, non-tender; bowel sounds normal; no masses,  no organomegaly Extremities: edema no edema appreciated,  Wound: clean and ry  Lab Results:  Recent Labs  07/04/13 0452 07/05/13 0440  WBC 6.6 5.7  HGB 7.6* 7.8*  HCT 23.1* 23.4*  PLT 131* 204   BMET:  Recent Labs  07/04/13 0452 07/05/13 0440  NA 134* 134*  K 4.1 4.5  CL 97 95*  CO2 26 29  GLUCOSE 127* 170*  BUN 39* 46*  CREATININE 2.05* 2.16*  CALCIUM 8.7 8.6    PT/INR: No results found for this basename: LABPROT, INR,  in the last 72 hours ABG    Component Value Date/Time   PHART 7.321* 07/01/2013 1907   HCO3 23.6 07/01/2013 1907   TCO2 25 07/02/2013 1636   ACIDBASEDEF 2.0 07/01/2013 1907   O2SAT 95.0 07/01/2013 1907   CBG (last 3)   Recent Labs  07/04/13 2232 07/05/13 0617 07/05/13 1122  GLUCAP 186* 177* 220*    Assessment/Plan: S/P Procedure(s)  (LRB): CORONARY ARTERY BYPASS GRAFTING (CABG) (N/A) INTRAOPERATIVE TRANSESOPHAGEAL ECHOCARDIOGRAM (N/A)  1. CV- NSR, rate in the 60s, mild hypertension- continue Lopressor, will add Norvasc for blood pressure, no ACE due to CKD 2. Pulm- no acute issues, off oxygen encouraged use of IS 3. Renal- creatinine trending up, 2.16 today, volume status is stable, will d/c Lasix 4. Expected Blood Loss Anemia- stable at 7.8, patient asymptomatic 5. DM- Sugars mildly elevated, will continue Levemir, will add meal time coverage 6. Dispo- patient stable, will monitor creatinine, d/c EPW   LOS: 7 days    Ellwood Handler 07/05/2013  Feels much better today, slept better last night +bm today, mild distention Walking better Poss home Thursday I have seen and examined Lance Collier and agree with the above assessment  and plan.  Grace Isaac MD Beeper [phone] Office [phone] 07/05/2013 1:01 PM

## 2013-07-05 NOTE — Progress Notes (Signed)
Pt up ambulating around unit with wife and daughter; will cont. To monitor; steady gait noted; no need for walker at this time.

## 2013-07-05 NOTE — Progress Notes (Signed)
1435 Cardiac Rehab Pt has already ambulated in hall twice today. We will follow pt. Deon Pilling, RN 07/05/2013 2:51 PM

## 2013-07-06 LAB — GLUCOSE, CAPILLARY
GLUCOSE-CAPILLARY: 164 mg/dL — AB (ref 70–99)
Glucose-Capillary: 201 mg/dL — ABNORMAL HIGH (ref 70–99)
Glucose-Capillary: 229 mg/dL — ABNORMAL HIGH (ref 70–99)
Glucose-Capillary: 186 mg/dL — ABNORMAL HIGH (ref 70–99)

## 2013-07-06 LAB — BASIC METABOLIC PANEL
BUN: 45 mg/dL — ABNORMAL HIGH (ref 6–23)
CO2: 25 mEq/L (ref 19–32)
Calcium: 8.8 mg/dL (ref 8.4–10.5)
Chloride: 92 mEq/L — ABNORMAL LOW (ref 96–112)
Creatinine, Ser: 1.92 mg/dL — ABNORMAL HIGH (ref 0.50–1.35)
GFR calc Af Amer: 39 mL/min — ABNORMAL LOW (ref >90)
GFR calc non Af Amer: 34 mL/min — ABNORMAL LOW (ref >90)
Glucose, Bld: 184 mg/dL — ABNORMAL HIGH (ref 70–99)
Potassium: 4.6 mEq/L (ref 3.7–5.3)
Sodium: 130 mEq/L — ABNORMAL LOW (ref 137–147)

## 2013-07-06 LAB — CBC
HCT: 25.2 % — ABNORMAL LOW (ref 39.0–52.0)
Hemoglobin: 8.1 g/dL — ABNORMAL LOW (ref 13.0–17.0)
MCH: 28.2 pg (ref 26.0–34.0)
MCHC: 32.1 g/dL (ref 30.0–36.0)
MCV: 87.8 fL (ref 78.0–100.0)
Platelets: 242 10*3/uL (ref 150–400)
RBC: 2.87 MIL/uL — ABNORMAL LOW (ref 4.22–5.81)
RDW: 13.9 % (ref 11.5–15.5)
WBC: 6.2 10*3/uL (ref 4.0–10.5)

## 2013-07-06 MED ORDER — INSULIN ASPART 100 UNIT/ML ~~LOC~~ SOLN
10.0000 [IU] | Freq: Three times a day (TID) | SUBCUTANEOUS | Status: DC
  Administered 2013-07-06: 10 [IU] via SUBCUTANEOUS
  Administered 2013-07-07: 4 [IU] via SUBCUTANEOUS

## 2013-07-06 MED ORDER — ENOXAPARIN SODIUM 40 MG/0.4ML ~~LOC~~ SOLN
40.0000 mg | Freq: Every day | SUBCUTANEOUS | Status: DC
  Administered 2013-07-06: 40 mg via SUBCUTANEOUS
  Filled 2013-07-06 (×2): qty 0.4

## 2013-07-06 MED ORDER — AMLODIPINE BESYLATE 10 MG PO TABS
10.0000 mg | ORAL_TABLET | Freq: Every day | ORAL | Status: DC
  Administered 2013-07-06 – 2013-07-07 (×2): 10 mg via ORAL
  Filled 2013-07-06 (×2): qty 1

## 2013-07-06 NOTE — Discharge Instructions (Signed)
Coronary Artery Bypass Grafting, Care After °Refer to this sheet in the next few weeks. These instructions provide you with information on caring for yourself after your procedure. Your health care provider may also give you more specific instructions. Your treatment has been planned according to current medical practices, but problems sometimes occur. Call your health care provider if you have any problems or questions after your procedure. °WHAT TO EXPECT AFTER THE PROCEDURE °Recovery from surgery will be different for everyone. Some people feel well after 3 or 4 weeks, while for others it takes longer. After your procedure, it is typical to have the following: °· Nausea and a lack of appetite.   °· Constipation. °· Weakness and fatigue.   °· Depression or irritability.   °· Pain or discomfort at your incision site. °HOME CARE INSTRUCTIONS °· Only take over-the-counter or prescription medicines as directed by your health care provider. Take all medicines exactly as directed. Do not stop taking medicines or start any new medicines without first checking with your health care provider.   °· Take your pulse as directed by your health care provider. °· Perform deep breathing as directed by your health care provider. If you were given a device called an incentive spirometer, use it to practice deep breathing several times a day. Support your chest with a pillow or your arms when you take deep breaths or cough. °· Keep incision areas clean, dry, and protected. Remove or change any bandages (dressings) only as directed by your health care provider. You may have skin adhesive strips over the incision areas. Do not take the strips off. They will fall off on their own. °· Check incision areas daily for any swelling, redness, or drainage. °· If incisions were made in your legs, do the following: °· Avoid crossing your legs.   °· Avoid sitting for long periods of time. Change positions every 30 minutes.   °· Elevate your legs  when you are sitting.   °· Wear compression stockings as directed by your health care provider. These stockings help keep blood clots from forming in your legs. °· Take showers once your health care provider approves. Until then, only take sponge baths. Pat incisions dry. Do not rub incisions with a washcloth or towel. Do not take tub baths or go swimming until your health care provider approves. °· Eat foods that are high in fiber, such as raw fruits and vegetables, whole grains, beans, and nuts. Meats should be lean cut. Avoid canned, processed, and fried foods. °· Drink enough fluids to keep your urine clear or pale yellow. °· Weigh yourself every day. This helps identify if you are retaining fluid that may make your heart and lungs work harder.   °· Rest and limit activity as directed by your health care provider. You may be instructed to: °· Stop any activity at once if you have chest pain, shortness of breath, irregular heartbeats, or dizziness. Get help right away if you have any of these symptoms. °· Move around frequently for short periods or take short walks as directed by your health care provider. Increase your activities gradually. You may need physical therapy or cardiac rehabilitation to help strengthen your muscles and build your endurance. °· Avoid lifting, pushing, or pulling anything heavier than 10 lb (4.5 kg) for at least 6 weeks after surgery. °· Do not drive until your health care provider approves.  °· Ask your health care provider when you may return to work and resume sexual activity. °· Follow up with your health care provider as   directed.   °SEEK MEDICAL CARE IF: °· You have swelling, redness, increasing pain, or drainage at the site of an incision.   °· You develop a fever.   °· You have swelling in your ankles or legs.   °· You have pain in your legs.   °· You have weight gain of 2 or more pounds a day. °· You are nauseous or vomit. °· You have diarrhea.  °SEEK IMMEDIATE MEDICAL CARE  IF: °· You have chest pain that goes to your jaw or arms. °· You have shortness of breath.   °· You have a fast or irregular heartbeat.   °· You notice a "clicking" in your breastbone (sternum) when you move.   °· You have numbness or weakness in your arms or legs. °· You feel dizzy or lightheaded.   °MAKE SURE YOU: °· Understand these instructions. °· Will watch your condition. °· Will get help right away if you are not doing well or get worse. °Document Released: 12/27/2004 Document Revised: 02/09/2013 Document Reviewed: 11/16/2012 °ExitCare® Patient Information ©2014 [employer]. ° °Endoscopic Saphenous Vein Harvesting °Care After °Refer to this sheet in the next few weeks. These instructions provide you with information on caring for yourself after your procedure. Your caregiver may also give you more specific instructions. Your treatment has been planned according to current medical practices, but problems sometimes occur. Call your caregiver if you have any problems or questions after your procedure. °HOME CARE INSTRUCTIONS °Medicine °· Take whatever pain medicine your surgeon prescribes. Follow the directions carefully. Do not take over-the-counter pain medicine unless your surgeon says it is okay. Some pain medicine can cause bleeding problems for several weeks after surgery. °· Follow your surgeon's instructions about driving. You will probably not be permitted to drive after heart surgery. °· Take any medicines your surgeon prescribes. Any medicines you took before your heart surgery should be checked with your caregiver before you start taking them again. °Wound care °· Ask your surgeon how long you should keep wearing your elastic bandage or stocking. °· Check the area around your surgical cuts (incisions) whenever your bandages (dressings) are changed. Look for any redness or swelling. °· You will need to return to have the stitches (sutures) or staples taken out. Ask your surgeon when to do  that. °· Ask your surgeon when you can shower or bathe. °Activity °· Try to keep your legs raised when you are sitting. °· Do any exercises your caregivers have given you. These may include deep breathing exercises, coughing, walking, or other exercises. °SEEK MEDICAL CARE IF: °· You have any questions about your medicines. °· You have more leg pain, especially if your pain medicine stops working. °· New or growing bruises develop on your leg. °· Your leg swells, feels tight, or becomes red. °· You have numbness in your leg. °SEEK IMMEDIATE MEDICAL CARE IF: °· Your pain gets much worse. °· Blood or fluid leaks from any of the incisions. °· Your incisions become warm, swollen, or red. °· You have chest pain. °· You have trouble breathing. °· You have a fever. °· You have more pain near your leg incision. °MAKE SURE YOU: °· Understand these instructions. °· Will watch your condition. °· Will get help right away if you are not doing well or get worse. °Document Released: 02/19/2011 Document Revised: 09/01/2011 Document Reviewed: 02/19/2011 °ExitCare® Patient Information ©2014 [employer]. ° ° °

## 2013-07-06 NOTE — Discharge Summary (Signed)
Physician Discharge Summary  Patient ID: Lance Collier MRN: [id number] DOB/AGE: 1945-08-22 68 y.o.  Admit date: 06/28/2013 Discharge date: 07/07/2013  Admission Diagnoses:  Patient Active Problem List   Diagnosis Date Noted  . Anemia   . Left main coronary artery disease 06/28/2013  . Equivalent angina, his DOE 06/24/2013  . Obesity (BMI 30-39.9) 03/19/2013  . Murmur, cardiac  11/23/2012  . DOE (dyspnea on exertion) 06/12/2011  . CAD (coronary artery disease), complex PCI/DES to RCA  06/12/2011  . Diabetes mellitus, type 2 IDDM 06/12/2011  . HTN (hypertension) 06/12/2011  . Dyslipidemia, Zocor intol 06/12/2011  . 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  Likely thromboembolic event with PDA occlusion. 06/12/2011    Class: Diagnosis of  . Chronic kidney disease (CKD), stage II (mild) 06/12/2011    Class: Diagnosis of   Discharge Diagnoses:   Patient Active Problem List   Diagnosis Date Noted  . 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 Anemia   . Left main coronary artery disease 06/28/2013  . Equivalent angina, his DOE 06/24/2013  . Obesity (BMI 30-39.9) 03/19/2013  . Murmur, cardiac  11/23/2012  . DOE (dyspnea on exertion) 06/12/2011  . CAD (coronary artery disease), complex PCI/DES to RCA  06/12/2011  . Diabetes mellitus, type 2 IDDM 06/12/2011  . HTN (hypertension) 06/12/2011  . Dyslipidemia, Zocor intol 06/12/2011  . History of Type IVa MI, peak Troponin 1.63 - peri-PCI infarction during complex stenting of torutuos RCA.  Likely thromboembolic event with PDA occlusion. 06/12/2011    Class: Diagnosis of  . Chronic kidney disease (CKD), stage II (mild) 06/12/2011    Class: Diagnosis of   Discharged Condition: good  History of Present Illness:   [name] is a 68 yo obese white male with known history of, CKD, DM, HTN, Coronary Artery Disease S/P PCI with stent placement to his RCA in 2012.  The patient initially  did well with most recent Myoview in 2014 showing no evidence of ischemia or infarction.   However recently he developed increasing exertional shortness of breath.  He was evaluated by his Ophthalmologist last week due to visual complaints.  He was noted to have some diabetic changes and retinal hemorrhages.  Due to this and his worsening exertional symptoms it was felt he should be urgently evaluated by his Cardiologist.    He was evaluated by [facility] office on 06/24/2013 at which time they felt the patient would benefit from cardiac catheterization.  This was performed on 06/28/2013 and was found to have multivessel CAD with some LM involvement.  Due to his history of diabetes it was felt Coronary Bypass procedure would be the patient's chest treatment option.  He was admitted for further workup and TCTS was consulted.    Hospital Course:   Upon admission patient was started on Heparin drip.  He was evaluated by Dr. Servando Snare who reviewed the patient's catheterization films and was in agreement that Coronary Bypass Grafting procedure would be the patient's chest treatment option.  The risks and benefits of the procedure were explained to the patient and he was agreeable to proceed. The patient had known history of Carotid Stenosis and Claudication with ambulation.  Vascular surgery consult was obtained who requested repeat Carotid Duplex.  This showed no further disease progression and it was felt the patient could follow up on an outpatient basis.  In regards to his Claudication the patient wanted conservative management at this  time.   The patient was taken to the operating room on 07/01/2013.  He underwent CABG x 3 utilizing LIMA to LAD, SVG to Left Circumflex, and SVG to Distal RCA.  He also underwent Endoscopic Saphenous vein harvest from his left and right thigh.  He tolerated the procedure well and was taken to the SICU in stable condition.  The patient was extubated the evening of surgery.  During his  stay in the ICU the patient was weaned off Dopamine as tolerated.  His chest tubes and arterial lines were removed without difficulty.  He received IV diuresis for hypervolemia.  He was ambulating without difficulty and transferred to the stepdown unit in stable condition.  The patient has continued to progress.  He has been having issues with hyperglycemia and his insulin has been increased as tolerated.  He is maintaining NSR and his pacing wires were removed without difficulty.  His creatinine has been trending up, but has stabilized at his baseline level of 1.92.  He is ambulating without difficulty.  He is tolerating a diabetic diet.  He is medically stable at this time.  He has been seen and evaluated by Dr. Servando Snare. He is felt surgically stable for discharge today. He will need to follow up with Dr. Servando Snare in 3 weeks with CXR prior to his appointment.  He will also need to follow up with his Primary Cardiologist.        Significant Diagnostic Studies: angiography:   Hemodynamics:  Central Aortic / Mean Pressures: 155/63 mmHg; 98 mmHg  Left Ventricular Pressures / EDP: 157/9 mmHg; 14 mmHg Left Ventriculography:  EF: 55-60 %  Wall Motion: Normal Coronary Anatomy:  Left Main: Ostial ~50-70% stenosis (damping of pressure with 5 Fr catheter engagement & no blowback into Aorta. LAD: Moderate large-caliber vessel extends around the apex the heart. There is mild luminal irregularities throughout with a 20% mid lesion that looks to be intramyocardial.  D1: Small-caliber vessel with ostial 60% stenosis.  D2: Small-caliber vessel, angiographically normal Left Circumflex: Large-caliber vessel that gives off a very proximal Ramus-like OM1 that is somewhat tortuous in its course. The circumflex complex and continued down to the AV groove where it gives off a very small marginal branch before terminating as a moderate large-caliber OM 2 then bifurcates distally. Is a very small AV groove circumflex comes  off at the same site as a smaller marginal branch.  OM1: Large-caliber ramus-like vessel that reaches down to the inferior apex. It bifurcates distally with minimal luminal irregularities proximally.  OM 2: Moderate caliber vessel which bifurcates into 2 small-moderate caliber marginal branches. RCA: Moderate caliber tortuous vessel with patent stents in the proximal and mid segment. There remains a slight hazy opacity in the second bend within the stent. There is otherwise diffuse luminal irregularities with a roughly 40-50% stenosis before bifurcates into the PDA and the Right Posterior AV Groove Branch (RPAV)  RPDA: Moderate caliber vessel that reaches the apex. Minimal luminal irregularities. Tapers again.  RPL System:The RPAV begins a moderate caliber vessel bifurcates into 2 small caliber PL branches to  Treatments: surgery:   Coronary artery bypass grafting x3 with left internal mammary to the left anterior descending coronary artery,  reverse saphenous vein graft to the obtuse marginal coronary artery, reverse saphenous vein graft to the distal right coronary artery with bilateral thigh endo vein harvesting.  Disposition: 01-Home or Self Care  Discharge Medications:    Medication List    STOP taking these medications  isosorbide mononitrate 30 MG 24 hr tablet  Commonly known as:  IMDUR     nebivolol 2.5 MG tablet  Commonly known as:  BYSTOLIC     nitroGLYCERIN 0.4 MG SL tablet  Commonly known as:  NITROSTAT     valsartan-hydrochlorothiazide 160-12.5 MG per tablet  Commonly known as:  DIOVAN-HCT      TAKE these medications       amLODipine 10 MG tablet  Commonly known as:  NORVASC  Take 1 tablet (10 mg total) by mouth daily.     aspirin 325 MG EC tablet  Take 1 tablet (325 mg total) by mouth daily.     fenofibrate 160 MG tablet  Take 160 mg by mouth daily.     folic acid 1 MG tablet  Commonly known as:  FOLVITE  Take 1 tablet (1 mg total) by mouth daily. For  one month then stop.     HUMALOG KWIKPEN 100 UNIT/ML KiwkPen  Generic drug:  insulin lispro  Inject 10-15 Units into the skin 3 (three) times daily. Sliding scale. 10 units with breakfast and 15 units with lunch and supper     insulin glargine 100 UNIT/ML injection  Commonly known as:  LANTUS  Inject 40-60 Units into the skin 2 (two) times daily. 40 units in am, 60 units in pm     metoprolol tartrate 25 MG tablet  Commonly known as:  LOPRESSOR  Take 0.5 tablets (12.5 mg total) by mouth 2 (two) times daily.     pantoprazole 40 MG tablet  Commonly known as:  PROTONIX  TAKE 1 TABLET ONCE DAILY AT 6:00AM.     rosuvastatin 10 MG tablet  Commonly known as:  CRESTOR  Take 10 mg by mouth 2 (two) times a week. Take 1 tablet twice a week     SYSTANE OP  Apply 1 drop to eye daily as needed (for dry eyes after seeing eye doctor).     traMADol 50 MG tablet  Commonly known as:  ULTRAM  Take 1 tablet (50 mg total) by mouth every 4 (four) hours as needed for moderate pain.       The patient has been discharged on:   1.Beta Blocker:  Yes [x   ]                              No   [   ]                              If No, reason:  2.Ace Inhibitor/ARB: Yes [   ]                                     No  [ x   ]                                     If No, reason: Elevated Creatinine  3.Statin:   Yes [ x  ]                  No  [   ]                  If No, reason:  4.Ecasa:  Yes  [ x  ]                  No   [   ]                  If No, reason:    Future Appointments Provider Department Dept Phone   07/20/2013 10:20 AM Cecilie Kicks, NP [facility] [phone]   08/04/2013 12:00 PM Grace Isaac, MD [facility] [phone]   12/22/2013 9:00 AM [facility] [address] [phone]   12/22/2013 10:00 AM [facility] [address] [facility] [id number]   12/22/2013 10:30 AM Elam Dutch,  MD [facility] [phone]     Follow-up Information   Follow up with [facility] R, NP On 07/20/2013. (see for Dr. Ellyn Hack at 10:20 am)    Specialty:  Cardiology   Contact information:   [address] [place] [address] [phone]       Follow up with Grace Isaac, MD On 08/04/2013. (Appointment is at 12:00)    Specialty:  Cardiothoracic Surgery   Contact information:   [place] [address] [phone]       Follow up with [facility] On 08/04/2013. (Appointment is at 11:00)    Contact information:   [place]       Follow up with Sheela Stack, MD. (Please call for a follow up appointment regarding further diabetes management (as HGA1C 10.2) and further surveillance of creatinine (1.93 pre op))    Specialty:  Endocrinology   Contact information:   [place] [address] [phone]       Follow up with Elam Dutch, MD On 12/22/2013. (Appointment time is at 10:30 am)    Specialty:  Vascular Surgery   Contact information:   [place] [address] [phone]       Signed: Nani Skillern PA-C 07/07/2013, 9:45 AM

## 2013-07-06 NOTE — Progress Notes (Signed)
CARDIAC REHAB PHASE I   PRE:  Rate/Rhythm: 63 SR  BP:  Supine:   Sitting: 114/52  Standing:    SaO2: 95 RA  MODE:  Ambulation: 450 ft   POST:  Rate/Rhythm: 76  BP:  Supine:   Sitting: 112/50  Standing:    SaO2: 95 RA 1450-1515 Assisted x 1 to ambulate. Gait steady without any assistive devices.. VS stable. Pt to recliner after walk with call light in reach.   Rodney Langton RN 07/06/2013 3:11 PM

## 2013-07-06 NOTE — Progress Notes (Addendum)
      [address]       ,[place] [address]             [phone]      5 Days Post-Op Procedure(s) (LRB): CORONARY ARTERY BYPASS GRAFTING (CABG) (N/A) INTRAOPERATIVE TRANSESOPHAGEAL ECHOCARDIOGRAM (N/A)  Subjective:  [name] states he doesn't feel as well today.  He states he did not sleep well and he does not feel he has a much energy as he did yesterday.  He is ambulating.  +BM, only once since surgery  Objective: Vital signs in last 24 hours: Temp:  [97.9 F (36.6 C)-99.3 F (37.4 C)] 99.3 F (37.4 C) (01/14 0429) Pulse Rate:  [66-78] 72 (01/14 0429) Cardiac Rhythm:  [-] Normal sinus rhythm (01/13 2014) Resp:  [18] 18 (01/13 2026) BP: (114-155)/(50-71) 137/61 mmHg (01/14 0657) SpO2:  [91 %-92 %] 91 % (01/14 0429) Weight:  [216 lb 14.9 oz (98.4 kg)] 216 lb 14.9 oz (98.4 kg) (01/14 0433)  Intake/Output from previous day: 01/13 0701 - 01/14 0700 In: 720 [P.O.:720] Out: 3000 [Urine:3000] :  General appearance: alert, cooperative and no distress Heart: regular rate and rhythm Lungs: clear to auscultation bilaterally Abdomen: + distention, non-tender, + BS Extremities: edema +1 pitting Wound: clean and dry  Lab Results:  Recent Labs  07/05/13 0440 07/06/13 0444  WBC 5.7 6.2  HGB 7.8* 8.1*  HCT 23.4* 25.2*  PLT 204 242   BMET:  Recent Labs  07/05/13 0440 07/06/13 0444  NA 134* 130*  K 4.5 4.6  CL 95* 92*  CO2 29 25  GLUCOSE 170* 184*  BUN 46* 45*  CREATININE 2.16* 1.92*  CALCIUM 8.6 8.8    PT/INR: No results found for this basename: LABPROT, INR,  in the last 72 hours ABG    Component Value Date/Time   PHART 7.321* 07/01/2013 1907   HCO3 23.6 07/01/2013 1907   TCO2 25 07/02/2013 1636   ACIDBASEDEF 2.0 07/01/2013 1907   O2SAT 95.0 07/01/2013 1907   CBG (last 3)   Recent Labs  07/05/13 1122 07/05/13 1621 07/05/13 2127  GLUCAP 220* 214* 241*    Assessment/Plan: S/P Procedure(s) (LRB): CORONARY ARTERY BYPASS GRAFTING (CABG)  (N/A) INTRAOPERATIVE TRANSESOPHAGEAL ECHOCARDIOGRAM (N/A)  1. CV- NSR, pressure remains elevated- will continue Lopressor and Increase Norvasc to 10 mg daily 2. Pulm- no acute issues, encouraged IS 3. Renal- creatinine is back down at baseline 1.92, remains mildly volume overloaded continue Lasix 4. GI- abdominal distention, has moved bowels since surgery, will order lactulose prn 5. DM-sugars are trending up, will increase meal time coverage, patient takes 10-15 units at home 6. Dispo- patient not feeling great today, is medically stable at this time, will plan for d/c in AM if patient feels better    LOS: 8 days    Lance Collier 07/06/2013  Tonight pateint feels well, wants to go home in am Ambulated well today I have seen and examined Lance Collier and agree with the above assessment  and plan.  Lance Isaac MD [facility] [phone] Office [phone] 07/06/2013 5:50 PM

## 2013-07-06 NOTE — Progress Notes (Signed)
Inpatient Diabetes Program Recommendations  AACE/ADA: New Consensus Statement on Inpatient Glycemic Control (2013)  Target Ranges:  Prepandial:   less than 140 mg/dL      Peak postprandial:   less than 180 mg/dL (1-2 hours)      Critically ill patients:  140 - 180 mg/dL   Results for [name], Lance Collier (MRN [id number]) as of 07/06/2013 10:36  Ref. Range 07/05/2013 06:17 07/05/2013 11:22 07/05/2013 16:21 07/05/2013 21:27 07/06/2013 06:21  Glucose-Capillary Latest Range: 70-99 mg/dL 177 (H) 220 (H) 214 (H) 241 (H) 201 (H)   Inpatient Diabetes Program Recommendations Insulin - Basal: Please consider increasing Levemir to 43 units BID. Insulin-Meal coverage:  Noted Novolog meal coverage was increased from 5 to 10 units TID.  Thanks, Barnie Alderman, RN, MSN, CCRN Diabetes Coordinator Inpatient Diabetes Program [phone] (Team Pager) [phone] ([facility] office) [phone] [facility] office)

## 2013-07-07 LAB — GLUCOSE, CAPILLARY: GLUCOSE-CAPILLARY: 143 mg/dL — AB (ref 70–99)

## 2013-07-07 MED ORDER — AMLODIPINE BESYLATE 10 MG PO TABS: 10.0000 mg | ORAL_TABLET | Freq: Every day | ORAL | Status: DC

## 2013-07-07 MED ORDER — TRAMADOL HCL 50 MG PO TABS: 50.0000 mg | ORAL_TABLET | ORAL | Status: DC | PRN

## 2013-07-07 MED ORDER — FOLIC ACID 1 MG PO TABS: 1.0000 mg | ORAL_TABLET | Freq: Every day | ORAL | Status: DC

## 2013-07-07 MED ORDER — ASPIRIN 325 MG PO TBEC: 325.0000 mg | DELAYED_RELEASE_TABLET | Freq: Every day | ORAL | Status: DC

## 2013-07-07 MED ORDER — METOPROLOL TARTRATE 25 MG PO TABS: 12.5000 mg | ORAL_TABLET | Freq: Two times a day (BID) | ORAL | Status: DC

## 2013-07-07 NOTE — Progress Notes (Signed)
[id number] Cardiac Rehab Completed discharge education with pt and wife. Pt sleepy during eduction. Pt's wife able to recall education with teach back. Pt was able to recall some of information. He agrees to [facility]. CRP in [place], will send referral. Deon Pilling, RN 07/07/2013 10:40 AM

## 2013-07-07 NOTE — Progress Notes (Signed)
      [address]       [place] [address]             [phone]        6 Days Post-Op Procedure(s) (LRB): CORONARY ARTERY BYPASS GRAFTING (CABG) (N/A) INTRAOPERATIVE TRANSESOPHAGEAL ECHOCARDIOGRAM (N/A)  Subjective: Patient without complaints and wants to go home.  Objective: Vital signs in last 24 hours: Temp:  [97.3 F (36.3 C)-98.4 F (36.9 C)] 97.6 F (36.4 C) (01/15 0448) Pulse Rate:  [60-71] 60 (01/15 0448) Cardiac Rhythm:  [-]  Resp:  [16-17] 17 (01/15 0448) BP: (123-147)/(49-69) 139/69 mmHg (01/15 0448) SpO2:  [90 %-92 %] 92 % (01/15 0448) Weight:  [93.4 kg (205 lb 14.6 oz)] 93.4 kg (205 lb 14.6 oz) (01/15 0448)  Pre op weight 92.5 kg Current Weight  07/07/13 93.4 kg (205 lb 14.6 oz)      Intake/Output from previous day: 01/14 0701 - 01/15 0700 In: 243 [P.O.:240; I.V.:3] Out: 1600 [Urine:1600]   Physical Exam:  Cardiovascular: RRR, no murmurs, gallops, or rubs. Pulmonary: Clear to auscultation bilaterally; no rales, wheezes, or rhonchi. Abdomen: Soft, non tender, bowel sounds present. Extremities: Mild RLE edema. Wounds: Clean and dry.  No erythema or signs of infection.  Lab Results: CBC: Recent Labs  07/05/13 0440 07/06/13 0444  WBC 5.7 6.2  HGB 7.8* 8.1*  HCT 23.4* 25.2*  PLT 204 242   BMET:  Recent Labs  07/05/13 0440 07/06/13 0444  NA 134* 130*  K 4.5 4.6  CL 95* 92*  CO2 29 25  GLUCOSE 170* 184*  BUN 46* 45*  CREATININE 2.16* 1.92*  CALCIUM 8.6 8.8    PT/INR:  Lab Results  Component Value Date   INR 1.28 07/01/2013   INR 0.94 06/30/2013   INR 0.94 06/24/2013   ABG:  INR: Will add last result for INR, ABG once components are confirmed Will add last 4 CBG results once components are confirmed  Assessment/Plan:  1. CV - SR. On Lopressor 12.5 bid and Norvasc 10 daily. No ACE secondary to elevated creatinine. 2.  Pulmonary - Encourage incentive spirometer 3. Volume Overload - Will finish diuresis  today 4.  Acute blood loss anemia - H and H yesterday 8.1 and 25.2 5.Hyponatremia-sodium 130. Likely related to diuresis. 6.Creatinine down to 1.92 yesterday. Pre op creatinine 1.93 7.DM-CBGs 186/164/143. On Insulin. Pre op HGA1C 10.2. Will need follow up with medical doctor as an outpatient. 8.Discharge home per Dr. Suzanne Boron MPA-C 07/07/2013,9:17 AM

## 2013-07-07 NOTE — Progress Notes (Signed)
Pt d/c instructions reviewed with pt and family at bedside. Copy of instructions, scripts, appointment cards given to pt. Pt instructed when to call MD if any signs or symptoms of infection to incisions, handouts provided on surgery.  Pt d/c'd home with family, with belongings, via wheelchair escorted by hospital volunteer.

## 2013-07-15 NOTE — Progress Notes (Signed)
Patient had cath --needing surgery-CABG

## 2013-07-18 ENCOUNTER — Telehealth: Payer: Self-pay | Admitting: *Deleted

## 2013-07-18 NOTE — Telephone Encounter (Signed)
FAXED CARDIAC REHAB ll ORDER ---SIGNED AND DATED

## 2013-07-20 ENCOUNTER — Ambulatory Visit (INDEPENDENT_AMBULATORY_CARE_PROVIDER_SITE_OTHER): Payer: Medicare Other | Admitting: Cardiology

## 2013-07-20 ENCOUNTER — Encounter: Payer: Self-pay | Admitting: Cardiology

## 2013-07-20 VITALS — BP 130/70 | HR 62 | Ht 69.0 in | Wt 203.0 lb

## 2013-07-20 DIAGNOSIS — R9431 Abnormal electrocardiogram [ECG] [EKG]: Secondary | ICD-10-CM

## 2013-07-20 DIAGNOSIS — N183 Chronic kidney disease, stage 3 unspecified: Secondary | ICD-10-CM | POA: Diagnosis not present

## 2013-07-20 DIAGNOSIS — I2510 Atherosclerotic heart disease of native coronary artery without angina pectoris: Secondary | ICD-10-CM

## 2013-07-20 DIAGNOSIS — N182 Chronic kidney disease, stage 2 (mild): Secondary | ICD-10-CM

## 2013-07-20 DIAGNOSIS — Z951 Presence of aortocoronary bypass graft: Secondary | ICD-10-CM

## 2013-07-20 DIAGNOSIS — D62 Acute posthemorrhagic anemia: Secondary | ICD-10-CM

## 2013-07-20 DIAGNOSIS — D649 Anemia, unspecified: Secondary | ICD-10-CM

## 2013-07-20 DIAGNOSIS — I739 Peripheral vascular disease, unspecified: Secondary | ICD-10-CM

## 2013-07-20 LAB — BASIC METABOLIC PANEL WITH GFR
BUN: 33 mg/dL — AB (ref 6–23)
CO2: 25 mEq/L (ref 19–32)
Calcium: 9.5 mg/dL (ref 8.4–10.5)
Chloride: 105 mEq/L (ref 96–112)
Creat: 1.73 mg/dL — ABNORMAL HIGH (ref 0.50–1.35)
GFR, EST AFRICAN AMERICAN: 46 mL/min — AB
GFR, Est Non African American: 39 mL/min — ABNORMAL LOW
GLUCOSE: 193 mg/dL — AB (ref 70–99)
Potassium: 4.6 mEq/L (ref 3.5–5.3)
Sodium: 136 mEq/L (ref 135–145)

## 2013-07-20 LAB — CBC
HEMATOCRIT: 28 % — AB (ref 39.0–52.0)
Hemoglobin: 9 g/dL — ABNORMAL LOW (ref 13.0–17.0)
MCH: 27.5 pg (ref 26.0–34.0)
MCHC: 32.1 g/dL (ref 30.0–36.0)
MCV: 85.6 fL (ref 78.0–100.0)
Platelets: 500 10*3/uL — ABNORMAL HIGH (ref 150–400)
RBC: 3.27 MIL/uL — ABNORMAL LOW (ref 4.22–5.81)
RDW: 14.4 % (ref 11.5–15.5)
WBC: 4.9 10*3/uL (ref 4.0–10.5)

## 2013-07-20 MED ORDER — METOPROLOL TARTRATE 25 MG PO TABS: 25.0000 mg | ORAL_TABLET | Freq: Two times a day (BID) | ORAL | Status: DC

## 2013-07-20 NOTE — Assessment & Plan Note (Signed)
Glucose is doing well.

## 2013-07-20 NOTE — Assessment & Plan Note (Signed)
Other than EKG pt has no complaints except occ chest pain which could be surgical.  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  He is walking without SOB or  Increased pain.

## 2013-07-20 NOTE — Patient Instructions (Signed)
I increased you Lopressor to keep BP and HR controlled.  I would like you to see Dr. Ellyn Hack in 2 weeks just for follow up.  Have lab work done today.

## 2013-07-20 NOTE — Assessment & Plan Note (Signed)
Rechecking labs today.

## 2013-07-20 NOTE — Progress Notes (Signed)
07/20/2013   PCP: Sheela Stack, MD   Chief Complaint  Patient presents with  . Follow-up    S/P hospital visit  ; Pt. had bypass surgery    Primary Cardiologist: Dr. Ellyn Hack   HPI:67 yo obese white male with known history of, CKD, DM, HTN, Coronary Artery Disease S/P PCI with stent placement to his RCA in 2012. The patient initially did well with most recent Myoview in 2014 showing no evidence of ischemia or infarction. However recently he developed increasing exertional shortness of breath. He was evaluated by his Ophthalmologist last week due to visual complaints. He was noted to have some diabetic changes and retinal hemorrhages. Due to this and his worsening exertional symptoms it was felt he should be urgently evaluated by his Cardiologist. He was evaluated by [facility] on 06/24/2013 at which time they felt the patient would benefit from cardiac catheterization. This was performed on 06/28/2013 and was found to have multivessel CAD with some LM involvement. Due to his history of diabetes it was felt Coronary Bypass procedure would be the patient's chest treatment option.  He was also found to have carotid disease Lt  60-79% and is followed by Dr. Oneida Alar.  He is back for follow up after undergoing CABG x 3 utilizing LIMA to LAD, SVG to Left Circumflex, and SVG to Distal RCA on 07/01/13. He progressed well post op and was discharged 07/07/13.  He was anemic at 8.1 at discharge and Cr. Had peaked at 2.16 was down to 1.92 at discharge.  Today he complains of some chest pain though difficult to tell if surgical vs. Anginal.  Though no DOE which was his anginal equivalent.  He is pale today.  He has been walking and doing some office work due to boredom.  He is eating well.  Minimal swelling.  He sleeps well at night.    Allergies  Allergen Reactions  . Atorvastatin Other (See Comments)    Leg pain  . Ibuprofen Hives  . Pravastatin   . Simvastatin Other (See Comments)   Leg pain    Current Outpatient Prescriptions  Medication Sig Dispense Refill  . amLODipine (NORVASC) 10 MG tablet Take 1 tablet (10 mg total) by mouth daily.  30 tablet  1  . aspirin EC 325 MG EC tablet Take 1 tablet (325 mg total) by mouth daily.  30 tablet  0  . fenofibrate 160 MG tablet Take 160 mg by mouth daily.      . folic acid (FOLVITE) 1 MG tablet Take 1 tablet (1 mg total) by mouth daily. For one month then stop.  30 tablet  0  . HUMALOG KWIKPEN 100 UNIT/ML SOPN Inject 10-15 Units into the skin 3 (three) times daily. Sliding scale. 10 units with breakfast and 15 units with lunch and supper      . insulin glargine (LANTUS) 100 UNIT/ML injection Inject 40-60 Units into the skin 2 (two) times daily. 40 units in am, 60 units in pm      . pantoprazole (PROTONIX) 40 MG tablet TAKE 1 TABLET ONCE DAILY AT 6:00AM.  30 tablet  9  . Polyethyl Glycol-Propyl Glycol (SYSTANE OP) Apply 1 drop to eye daily as needed (for dry eyes after seeing eye doctor).      . rosuvastatin (CRESTOR) 10 MG tablet Take 10 mg by mouth 2 (two) times a week. Take 1 tablet twice a week      . traMADol Veatrice Bourbon)  50 MG tablet Take 1 tablet (50 mg total) by mouth every 4 (four) hours as needed for moderate pain.  40 tablet  0  . metoprolol tartrate (LOPRESSOR) 25 MG tablet Take 1 tablet (25 mg total) by mouth 2 (two) times daily.  60 tablet  6   No current facility-administered medications for this visit.    Past Medical History  Diagnosis Date  . DM (diabetes mellitus) type II uncontrolled with eye manifestation 2013    Diabetic retinopathy with retinal hemorrhages since;, recurrent in August 2014 treated with Avastin shots  . Hypertension   . CAD S/P percutaneous coronary angioplasty 06/12/2011    status post complex PCI to the RCA requiring the use of GuideLiner catheter.  2  . Presence of drug coated stent in right coronary artery 06/12/2011    Subtotal proximal RCA (tortuous) --> complex PCI requiring Guideliner:  2 overlapping Promus element DES stents.  . 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  Likely thromboembolic event with PDA occlusion. 06/12/2011    Very difficult, complex procedure requiring multiple guidewires, guide-liner, etc.  Angiographic evidence of distal thrombo / anthero-embolism as likely etiology of Troponin of ~1.58. Pt remains asymptomatic.   Marland Kitchen NST (non-stress test) nonreactive     Myoview, January 2013: Diaphragmatic attenuation versus infarct in inferior wall, but no ischemia, and  . Shortness of breath on exertion   . GERD (gastroesophageal reflux disease)   . Anxiety   . 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 EF 55-60% with normal WM; mild concentric hypertrophy with normal relaxation.  Marland Kitchen PAD (peripheral artery disease)     with claudication  . High cholesterol   . Migraine ~ 2004    "once"  . Anemia   . 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 Tonsillectomy    . Myoview stress test  January '13    Diaphragmatic attenuation versus inferior infarct. No ischemia. Normal EF normal wall motion.  . Myoview stress test  June 2014    no ischemia  . Coronary angioplasty with stent placement Right September 2012    2 overlapping Promus DES 2.7 mm at 12 mm x2; postdilated to 3 mm  . Cardiac catheterization  06/28/2013  . Retinal laser procedure Bilateral     "more than once"   . Coronary artery bypass graft N/A 07/01/2013    Procedure: CORONARY ARTERY BYPASS GRAFTING (CABG);  Surgeon: Grace Isaac, MD;  Location: [facility];  Service: Open Heart Surgery;  Laterality: N/A;  Times 3 using left internal mammary artery and endoscopically harvested bilateral saphenous vein  . Intraoperative transesophageal echocardiogram N/A 07/01/2013    Procedure: INTRAOPERATIVE TRANSESOPHAGEAL ECHOCARDIOGRAM;  Surgeon:  Grace Isaac, MD;  Location: [facility];  Service: Open Heart Surgery;  Laterality: N/A;    [id number] colds or fevers,  weight is down 3 pounds from prior to admit  Skin:no rashes or ulcers HEENT:no blurred vision, no congestion CV:see HPI PUL:see HPI GI:no diarrhea constipation or melena, no indigestion GU:no hematuria, no dysuria MS:no joint pain, no claudication Neuro:no syncope, no lightheadedness Endo:+ diabetes, no thyroid disease  PHYSICAL EXAM BP 130/70  Pulse 62  Ht 5\' 9"  (1.753 m)  Wt 203 lb (92.08 kg)  BMI 29.96 kg/m2 General:Pleasant affect, NAD Skin:Pale, Warm and dry, brisk capillary  refill HEENT:normocephalic, sclera clear, mucus membranes moist, Lt ear canal with wax but TM clear, Rt TM clear Neck:supple, no JVD, + carotid bruits  Heart:S1S2 RRR without murmur, gallup, rub or click, incision of chest healing well without redness or drainage. Lungs:clear without rales, rhonchi, or wheezes [id number], non tender, + BS, do not palpate liver spleen or masses Ext:tr lower ext edema, 2+ pedal pulses, 2+ radial pulses Neuro:alert and oriented, MAE, follows commands, + facial symmetry  EKG:SR at 62 with t wave inversions in I, II and V6 and depressed t waves ant lat compared to EKG 07/02/13   ASSESSMENT AND PLAN Abnormal EKG Reviewed EKG with Dr. Claiborne Billings.  Now with t wave inversion in II and depressed t waves ant. Lat.  He has chest pain that could be chest wall pain now 3 weeks Post CABG.  No DOE which was complaint that lead to cath.  Pt is also pale and was anemic with HgB of 8.1 at discharge.  Will check labs.  We increased BB to 25 BID from 12.5 BID with HR 62.   Anemia Rechecking labs today  S/P CABG x 3, 07/01/13, LIMA-LAD; VG-RCA; VG- LCX Other than EKG pt has no complaints except occ chest pain which could be surgical.  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  He is walking without SOB or  Increased pain.  Diabetes mellitus, type 2 IDDM Glucose  is doing well.  Chronic kidney disease (CKD), stage II (mild) Recheck cr.

## 2013-07-20 NOTE — Assessment & Plan Note (Signed)
Recheck cr.

## 2013-07-20 NOTE — Assessment & Plan Note (Addendum)
Reviewed EKG with Dr. Claiborne Billings.  Now with t wave inversion in II and depressed t waves ant. Lat.  He has chest pain that could be chest wall pain now 3 weeks Post CABG.  No DOE which was complaint that lead to cath.  Pt is also pale and was anemic with HgB of 8.1 at discharge.  Will check labs.  We increased BB to 25 BID from 12.5 BID with HR 62.

## 2013-07-22 ENCOUNTER — Telehealth: Payer: Self-pay | Admitting: *Deleted

## 2013-07-22 NOTE — Telephone Encounter (Signed)
Spoke to patient. BMP AND CBC Result given . Verbalized understanding Keep appointment 08/05/13

## 2013-07-22 NOTE — Telephone Encounter (Signed)
Message copied by Raiford Simmonds on Fri Jul 22, 2013  5:18 PM ------      Message from: Ellyn Hack, DAVID W      Created: Fri Jul 22, 2013  4:11 PM       Overall, improved Kidney function & CBC.  Both are still not normal, but notably improved.            Leonie Man, MD       ------

## 2013-08-02 ENCOUNTER — Other Ambulatory Visit: Payer: Self-pay | Admitting: *Deleted

## 2013-08-02 DIAGNOSIS — I2510 Atherosclerotic heart disease of native coronary artery without angina pectoris: Secondary | ICD-10-CM

## 2013-08-04 ENCOUNTER — Encounter: Payer: Self-pay | Admitting: Cardiothoracic Surgery

## 2013-08-04 ENCOUNTER — Ambulatory Visit
Admission: RE | Admit: 2013-08-04 | Discharge: 2013-08-04 | Disposition: A | Discharge status: Home / Self Care | Payer: Medicare Other | Source: Ambulatory Visit | Attending: Cardiothoracic Surgery | Admitting: Cardiothoracic Surgery

## 2013-08-04 ENCOUNTER — Ambulatory Visit (INDEPENDENT_AMBULATORY_CARE_PROVIDER_SITE_OTHER): Payer: Self-pay | Admitting: Cardiothoracic Surgery

## 2013-08-04 VITALS — BP 126/73 | HR 69 | Resp 20 | Ht 69.0 in | Wt 205.0 lb

## 2013-08-04 DIAGNOSIS — R079 Chest pain, unspecified: Secondary | ICD-10-CM | POA: Diagnosis not present

## 2013-08-04 DIAGNOSIS — Z951 Presence of aortocoronary bypass graft: Secondary | ICD-10-CM

## 2013-08-04 DIAGNOSIS — I2510 Atherosclerotic heart disease of native coronary artery without angina pectoris: Secondary | ICD-10-CM

## 2013-08-04 DIAGNOSIS — R0609 Other forms of dyspnea: Secondary | ICD-10-CM | POA: Diagnosis not present

## 2013-08-04 DIAGNOSIS — Z09 Encounter for follow-up examination after completed treatment for conditions other than malignant neoplasm: Secondary | ICD-10-CM

## 2013-08-04 DIAGNOSIS — R0989 Other specified symptoms and signs involving the circulatory and respiratory systems: Secondary | ICD-10-CM | POA: Diagnosis not present

## 2013-08-04 MED ORDER — FUROSEMIDE 40 MG PO TABS: 20.0000 mg | ORAL_TABLET | Freq: Every day | ORAL | Status: DC

## 2013-08-04 MED ORDER — FUROSEMIDE 20 MG PO TABS: 20.0000 mg | ORAL_TABLET | Freq: Every day | ORAL | Status: DC

## 2013-08-04 NOTE — Progress Notes (Signed)
[address]       [place] [address]             [phone]                  [name] [facility] Medical Record [id number] Date of Birth: Dec 08, 1945  Referring [id number], Leonie Green, MD Primary Cardiology: Primary Jeanette Caprice, MD  Chief Complaint:   PostOp Follow Up Visit 07/01/2012  PREOPERATIVE DIAGNOSIS: Coronary occlusive disease with ostial left main disease.  POSTOPERATIVE DIAGNOSIS: Same  SURGICAL PROCEDURE: Coronary artery bypass grafting x3 with left  internal mammary to the left anterior descending coronary artery,  reverse saphenous vein graft to the obtuse marginal coronary artery,  reverse saphenous vein graft to the distal right coronary artery with  bilateral thigh endo vein harvesting.  SURGEON: Lanelle Bal, MD   History of Present Illness:     Patient with recent admission for unstable angina and had coronary artery bypass grafting January 9. He has known underlying chronic renal disease diabetes and cerebrovascular disease. He is making good progress postoperatively, he has returned to working 4-5 hours a day without difficulty. He would like to start cardiac rehabilitation. He's had no chest discomfort. He does have paresthesias over the left anterior chest related to the use of the mammary artery. He's had mild pedal edema especially late in the day when he has been up.     History  Smoking status  . Former Smoker -- 2.00 packs/day for 27 years  . Types: Cigarettes  . Quit date: 11/23/1996  Smokeless tobacco  . Never Used       Allergies  Allergen Reactions  . Atorvastatin Other (See Comments)    Leg pain  . Ibuprofen Hives  . Pravastatin   . Simvastatin Other (See Comments)    Leg pain    Current Outpatient Prescriptions  Medication Sig Dispense Refill  . amLODipine (NORVASC) 10 MG tablet Take 1 tablet (10 mg total) by mouth daily.  30 tablet  1  . aspirin EC 325 MG EC tablet Take 1 tablet  (325 mg total) by mouth daily.  30 tablet  0  . fenofibrate 160 MG tablet Take 160 mg by mouth daily.      . folic acid (FOLVITE) 1 MG tablet Take 1 tablet (1 mg total) by mouth daily. For one month then stop.  30 tablet  0  . HUMALOG KWIKPEN 100 UNIT/ML SOPN Inject 10-15 Units into the skin 3 (three) times daily. Sliding scale. 10 units with breakfast and 15 units with lunch and supper      . insulin glargine (LANTUS) 100 UNIT/ML injection Inject 40-60 Units into the skin 2 (two) times daily. 40 units in am, 60 units in pm      . metoprolol tartrate (LOPRESSOR) 25 MG tablet Take 1 tablet (25 mg total) by mouth 2 (two) times daily.  60 tablet  6  . pantoprazole (PROTONIX) 40 MG tablet TAKE 1 TABLET ONCE DAILY AT 6:00AM.  30 tablet  9  . rosuvastatin (CRESTOR) 10 MG tablet Take 10 mg by mouth 2 (two) times a week. Take 1 tablet twice a week       No current facility-administered medications for this visit.       Physical Exam: BP 126/73  Pulse 69  Resp 20  Ht 5\' 9"  (1.753 m)  Wt 205 lb (92.987 kg)  BMI 30.26 kg/m2  SpO2 97%  General  appearance: alert and cooperative Neurologic: intact Heart: regular rate and rhythm, S1, S2 normal, no murmur, click, rub or gallop Lungs: clear to auscultation bilaterally Abdomen: soft, non-tender; bowel sounds normal; no masses,  no organomegaly Extremities: edema Has mild pedal edema at both ankles and Homans sign is negative, no sign of DVT Wound: Sternum is stable and well healed vein harvest sites also well healed   Diagnostic Studies & Laboratory data:         Recent Radiology Findings: Dg Chest 2 View  08/04/2013   CLINICAL DATA:  CABG on 06/30/2013. Chest pain and dyspnea. Coronary artery atherosclerosis.  EXAM: CHEST  2 VIEW  COMPARISON:  DG CHEST 2 VIEW dated 07/05/2013; DG CHEST 1V PORT dated 07/03/2013  FINDINGS: Cardiothoracic index 49%, formerly 59%. Atherosclerotic aortic arch noted. The lungs appear clear. No pleural effusion. Mild  thoracic spondylosis. Coronary stent noted.  IMPRESSION: 1. Improved size of cardiopericardial silhouette, now within normal limits. No edema, pleural effusion, or acute findings.   Electronically Signed   By: Sherryl Barters M.D.   On: 08/04/2013 11:09      Recent Labs: Lab Results  Component Value Date   WBC 4.9 07/20/2013   HGB 9.0* 07/20/2013   HCT 28.0* 07/20/2013   PLT 500* 07/20/2013   GLUCOSE 193* 07/20/2013   ALT 37 06/24/2013   AST 26 06/24/2013   NA 136 07/20/2013   K 4.6 07/20/2013   CL 105 07/20/2013   CREATININE 1.73* 07/20/2013   BUN 33* 07/20/2013   CO2 25 07/20/2013   INR 1.28 07/01/2013   HGBA1C 10.2* 06/29/2013      Assessment / Plan:    Patient doing well following coronary artery bypass grafting, he does have mild pedal edema is started on Lasix 20 mg a day He was encouraged to start in the cardiac rehabilitation program in the next week or 2 I plan to see him back in 6 weeks He has a followup appointment with cardiology tomorrow   [place] B 08/04/2013 12:08 PM

## 2013-08-04 NOTE — Patient Instructions (Addendum)
    [address]       [place] [address]             [phone]       Coronary Artery Bypass Grafting  Care After  Refer to this sheet in the next few weeks. These instructions provide you with information on caring for yourself after your procedure. Your caregiver may also give you more specific instructions. Your treatment has been planned according to current medical practices, but problems sometimes occur. Call your caregiver if you have any problems or questions after your procedure.  Recovery from open heart surgery will be different for everyone. Some people feel well after 3 or 4 weeks, while for others it takes longer. After heart surgery, it may be normal to:  Not have an appetite, feel nauseated by the smell of food, or only want to eat a small amount.   Be constipated because of changes in your diet, activity, and medicines. Eat foods high in fiber. Add fresh fruits and vegetables to your diet. Stool softeners may be helpful.   Feel sad or unhappy. You may be frustrated or cranky. You may have good days and bad days. Do not give up. Talk to your caregiver if you do not feel better.   Feel weakness and fatigue. You many need physical therapy or cardiac rehabilitation to get your strength back.   Develop an irregular heartbeat called atrial fibrillation. Symptoms of atrial fibrillation are a fast, irregular heartbeat or feelings of fluttery heartbeats, shortness of breath, low blood pressure, and dizziness. If these symptoms develop, see your caregiver right away.  MEDICATION  Have a list of all the medicines you will be taking when you leave the hospital. For every medicine, know the following:   Name.   Exact dose.   Time of day to be taken.   How often it should be taken.   Why you are taking it.   Ask which medicines should or should not be taken together. If you take more than one heart medicine, ask if it is okay to take them together. Some  heart medicines should not be taken at the same time because they may lower your blood pressure too much.   Narcotic pain medicine can cause constipation. Eat fresh fruits and vegetables. Add fiber to your diet. Stool softener medicine may help relieve constipation.   Keep a copy of your medicines with you at all times.   Do not add or stop taking any medicine until you check with your caregiver.   Medicines can have side effects. Call your caregiver who prescribed the medicine if you:   Start throwing up, have diarrhea, or have stomach pain.   Feel dizzy or lightheaded when you stand up.   Feel your heart is skipping beats or is beating too fast or too slow.   Develop a rash.   Notice unusual bruising or bleeding.  HOME CARE INSTRUCTIONS  After heart surgery, it is important to learn how to take your pulse. Have your caregiver show you how to take your pulse.   Use your incentive spirometer. Ask your caregiver how long after surgery you need to use it.  Care of your chest incision  Tell your caregiver right away if you notice clicking in your chest (sternum).   Support your chest with a pillow or your arms when you take deep breaths and cough.   Follow your caregiver's instructions about when you can bathe or   swim.   Protect your incision from sunlight during the first year to keep the scar from getting dark.   Tell your caregiver if you notice:   Increased tenderness of your incision.   Increased redness or swelling around your incision.   Drainage or pus from your incision.  Care of your leg incision(s)  Avoid crossing your legs.   Avoid sitting for long periods of time. Change positions every half hour.   Elevate your leg(s) when you are sitting.   Check your leg(s) daily for swelling. Check the incisions for redness or drainage.   Diet is very important to heart health.   Eat plenty of fresh fruits and vegetables. Meats should be lean cut. Avoid canned,  processed, and fried foods.   Talk to a dietician. They can teach you how to make healthy food and drink choices.  Weight  Weigh yourself every day. This is important because it helps to know if you are retaining fluid that may make your heart and lungs work harder.   Use the same scale each time.   Weigh yourself every morning at the same time. You should do this after you go to the bathroom, but before you eat breakfast.   Your weight will be more accurate if you do not wear any clothes.   Record your weight.   Tell your caregiver if you have gained 2 pounds or more overnight.  Activity Stop any activity at once if you have chest pain, shortness of breath, irregular heartbeats, or dizziness. Get help right away if you have any of these symptoms.  Bathing.  Avoid soaking in a bath or hot tub until your incisions are healed.   Rest. You need a balance of rest and activity.   Exercise. Exercise per your caregiver's advice. You may need physical therapy or cardiac rehabilitation to help strengthen your muscles and build your endurance.   Climbing stairs. Unless your caregiver tells you not to climb stairs, go up stairs slowly and rest if you tire. Do not pull yourself up by the handrail.   Driving a car. Follow your caregiver's advice on when you may drive. You may ride as a passenger at any time. When traveling for long periods of time in a car, get out of the car and walk around for a few minutes every 2 hours.   Lifting. Avoid lifting, pushing, or pulling anything heavier than 10 pounds for 6 weeks after surgery or as told by your caregiver.   Returning to work. Check with your caregiver. People heal at different rates. Most people will be able to go back to work 6 to 12 weeks after surgery.   Sexual activity. You may resume sexual relations as told by your caregiver.  SEEK MEDICAL CARE IF:  Any of your incisions are red, painful, or have any type of drainage coming from them.     You have an oral temperature above 101.5 F .   You have ankle or leg swelling.   You have pain in your legs.   You have weight gain of 2 or more pounds a day.   You feel dizzy or lightheaded when you stand up.  SEEK IMMEDIATE MEDICAL CARE IF:  You have angina or chest pain that goes to your jaw or arms. Call your local emergency services right away.   You have shortness of breath at rest or with activity.   You have a fast or irregular heartbeat (arrhythmia).   There is   a "clicking" in your sternum when you move.   You have numbness or weakness in your arms or legs.  MAKE SURE YOU:  Understand these instructions.   Will watch your condition.   Will get help right away if you are not doing well or get worse.    No lifting over 25 lbs for 3 months [address] Start Cardiac Rehab

## 2013-08-05 ENCOUNTER — Encounter: Payer: Self-pay | Admitting: Cardiology

## 2013-08-05 ENCOUNTER — Ambulatory Visit (INDEPENDENT_AMBULATORY_CARE_PROVIDER_SITE_OTHER): Payer: Medicare Other | Admitting: Cardiology

## 2013-08-05 VITALS — BP 142/60 | HR 84 | Ht 69.0 in | Wt 202.3 lb

## 2013-08-05 DIAGNOSIS — Z951 Presence of aortocoronary bypass graft: Secondary | ICD-10-CM

## 2013-08-05 DIAGNOSIS — E785 Hyperlipidemia, unspecified: Secondary | ICD-10-CM | POA: Diagnosis not present

## 2013-08-05 DIAGNOSIS — R9431 Abnormal electrocardiogram [ECG] [EKG]: Secondary | ICD-10-CM

## 2013-08-05 DIAGNOSIS — I2510 Atherosclerotic heart disease of native coronary artery without angina pectoris: Secondary | ICD-10-CM

## 2013-08-05 DIAGNOSIS — R600 Localized edema: Secondary | ICD-10-CM

## 2013-08-05 DIAGNOSIS — R609 Edema, unspecified: Secondary | ICD-10-CM

## 2013-08-05 DIAGNOSIS — E669 Obesity, unspecified: Secondary | ICD-10-CM

## 2013-08-05 NOTE — Patient Instructions (Addendum)
You seem to be doing as well as expected post Bypass Surgery.  BP ok.  We will ensure that your prescriptions are up to day with refills.  I think the low dose of lasix should help with the swelling, but also recommend wearing support stockings for hte next few months. I will see you back in 3 months.  Leonie Man, MD  Your physician wants you to follow-up in: 3 months. You will receive a reminder letter in the mail two months in advance. If you don't receive a letter, please call our office to schedule the follow-up appointment.

## 2013-08-06 ENCOUNTER — Encounter: Payer: Self-pay | Admitting: Cardiology

## 2013-08-06 DIAGNOSIS — R600 Localized edema: Secondary | ICD-10-CM | POA: Insufficient documentation

## 2013-08-06 NOTE — Assessment & Plan Note (Signed)
This is a relatively unexpected and wants, the left main finding was somewhat concerning that he does note now feeling better still a bit sore from the surgery but improving daily. He is considering starting cardiac rehabilitation pending this isn't in the next visit with Dr. Servando Snare.

## 2013-08-06 NOTE — Assessment & Plan Note (Signed)
He is clearly lost some of his preoperative weight, hopefully with cardiac rehabilitation, he will begin initial good health he exercises as well as going to nutrition counseling.

## 2013-08-06 NOTE — Assessment & Plan Note (Signed)
In the absence of symptoms of, and recent revascularization these now somewhat persistent abnormalities on EKG we'll need to be monitored but are likely benign.

## 2013-08-06 NOTE — Assessment & Plan Note (Addendum)
He has been started on furosemide by Dr. Servando Snare. I think he likely will not require long-term diuresis, so we will continue low-dose diuretic for now. We'll also recommend wearing knee-high support hose.

## 2013-08-06 NOTE — Assessment & Plan Note (Signed)
Wall after multiple stents in the RCA he had CABG his arm low-dose beta blocker, but not an ARB/ACE inhibitor despite that. His blood pressures have not been stable before his renal function is stable to consider restarting. His most recent creatinine of 1.73. With him being a diabetic, it would be prudent to consider restarting an ACE/ARB

## 2013-08-06 NOTE — Assessment & Plan Note (Signed)
He is doing OK on Crestor 10 2 tablets a week. He is also on fenofibrate.  labs are being checked by his PCP. all refill his Crestor dose for now.

## 2013-08-06 NOTE — Progress Notes (Signed)
PATIENT: Lance Collier MRN: [id number]  DOB: 08/16/1945   DOV:08/06/2013 PCP: Sheela Stack, MD  Clinic Note: Chief Complaint  Patient presents with  . Follow-up    2 week follow upvisit, chest pain , sob little, edema--- start lasix has not picked up yet from pharmacy   HPI: Lance Collier is a 68 y.o.  male with a PMH below who presents today for second post CABG followup visit. Is a patient that it inherited from Dr. Chase Picket, who had significant RCA disease treated with very complex PCI procedure back in December of 2012. He also has diabetes (with significant ophthalmic issues), hypertension and carotid disease. He was seen as an urgent walk-in patient in early January for symptoms are concerning for progressively worsening angina which for him is mostly exertional dyspnea. His ophthalmologist referred her urgently, and he was seen by Cecilie Kicks, NP. I happened he presents in the clinic that day and we decided the best course of action was to proceed with cardiac catheterization this revealed moderate in-stent restenosis of the right coronary stent, but also noted significant left main disease. He was referred for CABG now performed on January 9. He saw Cecilie Kicks back on January 28, and was really only noting musculoskeletal chest pain from his CABG. He did have some new T-wave inversions on ECG but are also present in the day.  Interval History: Since his most recent visit, his been doing relatively well. He really denies any significant symptoms besides musculoskeletal discomfort. He just saw Dr. Servando Snare in followup and noted to have some mild edema, so he was started on Lasix.  He does not edema has improved while on Lasix. He did not note any PND or orthopnea no edema. He denied any anginal type chest pain, simply discomfort with movement or deep inspiration as well as the numbness along the left chest wall. He denies any significant palpitations or rapid heart  beats/irregular heartbeats. Nose lightheadedness, so syncope or near-syncope. No TIA or RCA symptoms. No melena, hematochezia hematuria. No claudication.  Past Medical History  Diagnosis Date  . DM (diabetes mellitus) type II uncontrolled with eye manifestation 2013    Diabetic retinopathy with retinal hemorrhages since;, recurrent in August 2014 treated with Avastin shots  . Hypertension   . CAD S/P percutaneous coronary angioplasty 06/12/2011    status post complex PCI to the RCA requiring the use of GuideLiner catheter.  2  . Presence of drug coated stent in right coronary artery 06/12/2011    Subtotal proximal RCA (tortuous) --> complex PCI requiring Guideliner: 2 overlapping Promus element DES stents.  . 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  Likely thromboembolic event with PDA occlusion. 06/12/2011    Very difficult, complex procedure requiring multiple guidewires, guide-liner, etc.  Angiographic evidence of distal thrombo / anthero-embolism as likely etiology of Troponin of ~1.58. Pt remains asymptomatic.   Marland Kitchen NST (non-stress test) nonreactive     Myoview, January 2013: Diaphragmatic attenuation versus infarct in inferior wall, but no ischemia, and  . Shortness of breath on exertion   . GERD (gastroesophageal reflux disease)   . Anxiety   . 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 EF 55-60% with normal WM; mild concentric hypertrophy with normal relaxation.  Marland Kitchen PAD (peripheral artery disease)     with claudication  . High cholesterol   . Migraine ~ 2004    "once"  .  Anemia   . 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 Tonsillectomy    . Myoview stress test  January '13    Diaphragmatic attenuation versus inferior infarct. No ischemia. Normal EF  normal wall motion.  . Myoview stress test  June 2014    no ischemia  . Coronary angioplasty with stent placement Right September 2012    2 overlapping Promus DES 2.7 mm at 12 mm x2; postdilated to 3 mm  . Cardiac catheterization  06/28/2013    ostial LM disese, RCA ~40-50%ISR  . Retinal laser procedure Bilateral     "more than once"   . Coronary artery bypass graft N/A 07/01/2013    Procedure: CORONARY ARTERY BYPASS GRAFTING (CABG);  Surgeon: Grace Isaac, MD;  Location: [facility];  Service: Open Heart Surgery;  Laterality: N/A;  Times 3 using left internal mammary artery and endoscopically harvested bilateral saphenous vein  . Intraoperative transesophageal echocardiogram N/A 07/01/2013    Procedure: INTRAOPERATIVE TRANSESOPHAGEAL ECHOCARDIOGRAM;  Surgeon: Grace Isaac, MD;  Location: [facility];  Service: Open Heart Surgery;  Laterality: N/A;  . Transthoracic echocardiogram  06/29/2013    Focal basal hypertrophy with normal LV size. EF 60-65% no regional WMA. Mild LA dilation    Allergies  Allergen Reactions  . Atorvastatin Other (See Comments)    Leg pain  . Ibuprofen Hives  . Pravastatin   . Simvastatin Other (See Comments)    Leg pain    Current Outpatient Prescriptions  Medication Sig Dispense Refill  . amLODipine (NORVASC) 10 MG tablet Take 1 tablet (10 mg total) by mouth daily.  30 tablet  1  . aspirin EC 325 MG EC tablet Take 1 tablet (325 mg total) by mouth daily.  30 tablet  0  . fenofibrate 160 MG tablet Take 160 mg by mouth daily.      . furosemide (LASIX) 20 MG tablet Take 1 tablet (20 mg total) by mouth daily.  30 tablet  0  . HUMALOG KWIKPEN 100 UNIT/ML SOPN Inject 10-15 Units into the skin 3 (three) times daily. Sliding scale. 10 units with breakfast and 15 units with lunch and supper      . insulin glargine (LANTUS) 100 UNIT/ML injection Inject 40-60 Units into the skin 2 (two) times daily. 40 units in am, 60 units in pm      . metoprolol tartrate (LOPRESSOR) 25 MG  tablet Take 1 tablet (25 mg total) by mouth 2 (two) times daily.  60 tablet  6  . pantoprazole (PROTONIX) 40 MG tablet TAKE 1 TABLET ONCE DAILY AT 6:00AM.  30 tablet  9  . rosuvastatin (CRESTOR) 10 MG tablet Take 10 mg by mouth 2 (two) times a week. Take 1 tablet twice a week       No current facility-administered medications for this visit.    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 He notably has not been exercising like he used to. He does walk [place] and [place]. He has social alcoholic beverages but is trying to cut that back and does not smoke.       He tells me he is a sucker for chips but does not eat sweets because of diabetes.  ROS: A comprehensive Review of Systems - Negative except His chronic dyspnea she is diabetic retinopathy. Otherwise symptoms that are pertinent are noted above.  PHYSICAL EXAM BP 142/60  Pulse 84  Ht 5\' 9"  (1.753 m)  Wt 202 lb 4.8 oz (91.763 kg)  BMI 29.86 kg/m2 General appearance: alert, cooperative, appears stated age, no distress and Well-nourished, and well groomed. Neck: no adenopathy, no carotid bruit, no JVD and supple, symmetrical, trachea midline Lungs: clear to auscultation bilaterally, normal percussion bilaterally and Nonlabored, good air movement Heart: regular rate and rhythm, S1, S2 normal, no murmur, click, rub or gallop, normal apical impulse and Mild discomfort left precordial wall and along the sternum. Well-healed scar. Abdomen: soft, non-tender; bowel sounds normal; no masses,  no organomegaly and Rotund abdomen Extremities: edema Trace to 1+ bilaterally. and no ulcers, gangrene or trophic changes Pulses: 2+ and symmetric Neurologic: Grossly normal  [id number] today: Yes Rate: 84 , Rhythm: NSR, otherwise no significant change noted -- P waves are persistent in 1, 2, and V6 that were previously noted as  new.  Recent Labs: Noted from January 28  ASSESSMENT / PLAN: S/P CABG (coronary artery bypass graft) This is a relatively unexpected and wants, the left main finding was somewhat concerning that he does note now feeling better still a bit sore from the surgery but improving daily. He is considering starting cardiac rehabilitation pending this isn't in the next visit with Dr. Servando Snare.  CAD in native artery Wall after multiple stents in the RCA he had CABG his arm low-dose beta blocker, but not an ARB/ACE inhibitor despite that. His blood pressures have not been stable before his renal function is stable to consider restarting. His most recent creatinine of 1.73. With him being a diabetic, it would be prudent to consider restarting an ACE/ARB  Dyslipidemia, Zocor intol He is doing OK on Crestor 10 2 tablets a week. He is also on fenofibrate.  labs are being checked by his PCP. all refill his Crestor dose for now.   Abnormal EKG In the absence of symptoms of, and recent revascularization these now somewhat persistent abnormalities on EKG we'll need to be monitored but are likely benign.  Edema of both legs He has been started on furosemide by Dr. Servando Snare. I think he likely will not require long-term diuresis, so we will continue low-dose diuretic for now.   Obesity (BMI 30-39.9) He is clearly lost some of his preoperative weight, hopefully with cardiac rehabilitation, he will begin initial good health he exercises as well as going to nutrition counseling.    Orders Placed This Encounter  Procedures  . EKG 12-Lead   No orders of the defined types were placed in this encounter.    Followup: 3 months  DAVID W. Ellyn Hack, M.D., M.S. Interventional Cardiology [facility]

## 2013-08-08 ENCOUNTER — Other Ambulatory Visit: Payer: Self-pay | Admitting: Vascular Surgery

## 2013-08-08 DIAGNOSIS — I6529 Occlusion and stenosis of unspecified carotid artery: Secondary | ICD-10-CM

## 2013-08-11 ENCOUNTER — Encounter (HOSPITAL_COMMUNITY)
Admission: RE | Admit: 2013-08-11 | Discharge: 2013-08-11 | Disposition: A | Discharge status: Home / Self Care | Payer: Medicare Other | Source: Ambulatory Visit | Attending: Cardiology | Admitting: Cardiology

## 2013-08-11 DIAGNOSIS — Z951 Presence of aortocoronary bypass graft: Secondary | ICD-10-CM | POA: Insufficient documentation

## 2013-08-11 DIAGNOSIS — Z87891 Personal history of nicotine dependence: Secondary | ICD-10-CM | POA: Insufficient documentation

## 2013-08-11 DIAGNOSIS — I129 Hypertensive chronic kidney disease with stage 1 through stage 4 chronic kidney disease, or unspecified chronic kidney disease: Secondary | ICD-10-CM | POA: Insufficient documentation

## 2013-08-11 DIAGNOSIS — N183 Chronic kidney disease, stage 3 unspecified: Secondary | ICD-10-CM | POA: Insufficient documentation

## 2013-08-11 DIAGNOSIS — I200 Unstable angina: Secondary | ICD-10-CM | POA: Insufficient documentation

## 2013-08-11 DIAGNOSIS — Z5189 Encounter for other specified aftercare: Secondary | ICD-10-CM | POA: Insufficient documentation

## 2013-08-11 DIAGNOSIS — I252 Old myocardial infarction: Secondary | ICD-10-CM | POA: Insufficient documentation

## 2013-08-11 DIAGNOSIS — I2510 Atherosclerotic heart disease of native coronary artery without angina pectoris: Secondary | ICD-10-CM | POA: Insufficient documentation

## 2013-08-11 DIAGNOSIS — I739 Peripheral vascular disease, unspecified: Secondary | ICD-10-CM | POA: Insufficient documentation

## 2013-08-11 DIAGNOSIS — Z9861 Coronary angioplasty status: Secondary | ICD-10-CM | POA: Insufficient documentation

## 2013-08-11 NOTE — Progress Notes (Signed)
Cardiac Rehab Medication Review by a Pharmacist  Does the patient  feel that his/her medications are working for him/her?  yes  Has the patient been experiencing any side effects to the medications prescribed?  no  Does the patient measure his/her own blood pressure or blood glucose at home?  yes - glucose  Does the patient have any problems obtaining medications due to transportation or finances?   no  Understanding of regimen: good Understanding of indications: good Potential of compliance: good    Pharmacist comments: Lance Collier describes a good understanding of his medications and how to take them. He states he occasionally misses doses of his humalog, but other than that, he describes good adherence to his regimen. He checks his blood glucose at home. All questions were answered at this time.  Tiaira Arambula C. Wyndi Northrup, PharmD Clinical Pharmacist-Resident Pager: [phone] Pharmacy: [phone] 08/11/2013 8:31 AM

## 2013-08-15 ENCOUNTER — Encounter (HOSPITAL_COMMUNITY)
Admission: RE | Admit: 2013-08-15 | Discharge: 2013-08-15 | Disposition: A | Discharge status: Home / Self Care | Payer: Medicare Other | Source: Ambulatory Visit | Attending: Cardiology | Admitting: Cardiology

## 2013-08-15 DIAGNOSIS — I129 Hypertensive chronic kidney disease with stage 1 through stage 4 chronic kidney disease, or unspecified chronic kidney disease: Secondary | ICD-10-CM | POA: Diagnosis not present

## 2013-08-15 DIAGNOSIS — I252 Old myocardial infarction: Secondary | ICD-10-CM | POA: Diagnosis not present

## 2013-08-15 DIAGNOSIS — I2510 Atherosclerotic heart disease of native coronary artery without angina pectoris: Secondary | ICD-10-CM | POA: Diagnosis not present

## 2013-08-15 DIAGNOSIS — I739 Peripheral vascular disease, unspecified: Secondary | ICD-10-CM | POA: Diagnosis not present

## 2013-08-15 DIAGNOSIS — Z951 Presence of aortocoronary bypass graft: Secondary | ICD-10-CM | POA: Diagnosis not present

## 2013-08-15 DIAGNOSIS — Z9861 Coronary angioplasty status: Secondary | ICD-10-CM | POA: Diagnosis not present

## 2013-08-15 DIAGNOSIS — Z5189 Encounter for other specified aftercare: Secondary | ICD-10-CM | POA: Diagnosis not present

## 2013-08-15 DIAGNOSIS — I200 Unstable angina: Secondary | ICD-10-CM | POA: Diagnosis not present

## 2013-08-15 DIAGNOSIS — N183 Chronic kidney disease, stage 3 unspecified: Secondary | ICD-10-CM | POA: Diagnosis not present

## 2013-08-15 DIAGNOSIS — Z87891 Personal history of nicotine dependence: Secondary | ICD-10-CM | POA: Diagnosis not present

## 2013-08-15 LAB — GLUCOSE, CAPILLARY
Glucose-Capillary: 237 mg/dL — ABNORMAL HIGH (ref 70–99)
Glucose-Capillary: 220 mg/dL — ABNORMAL HIGH (ref 70–99)

## 2013-08-15 NOTE — Progress Notes (Signed)
Pt in today for his first day of exercise at the 6:45 exercise class time.  Pt tolerated light exercise with no complaints.  Pt is a former participant who completed the phase II program in 2009.  Pt already returned back to work and finds no difficulty in this.  Monitor showed SR with ST depression no noted ectopy.  PHQ2 score 0. Pt's short term goal is to get in better shape. Advised pt to participate on a regular basis and be consistent with home exercise.  Pt's long term goal is to to lose weight.  These are attainable goals for pt to have and will encourage pt to attend education class and nutrition  classes that are offered in Cardiac rehab.  Will continue to reassess his progress toward meeting this goals. Cherre Huger, BSN

## 2013-08-17 ENCOUNTER — Encounter (HOSPITAL_COMMUNITY)
Admission: RE | Admit: 2013-08-17 | Discharge: 2013-08-17 | Disposition: A | Discharge status: Home / Self Care | Payer: Medicare Other | Source: Ambulatory Visit | Attending: Cardiology | Admitting: Cardiology

## 2013-08-19 ENCOUNTER — Encounter (HOSPITAL_COMMUNITY)
Admission: RE | Admit: 2013-08-19 | Discharge: 2013-08-19 | Disposition: A | Discharge status: Home / Self Care | Payer: Medicare Other | Source: Ambulatory Visit | Attending: Cardiology | Admitting: Cardiology

## 2013-08-22 ENCOUNTER — Encounter (HOSPITAL_COMMUNITY)
Admission: RE | Admit: 2013-08-22 | Discharge: 2013-08-22 | Disposition: A | Discharge status: Home / Self Care | Payer: Medicare Other | Source: Ambulatory Visit | Attending: Cardiology | Admitting: Cardiology

## 2013-08-22 DIAGNOSIS — I200 Unstable angina: Secondary | ICD-10-CM | POA: Insufficient documentation

## 2013-08-22 DIAGNOSIS — I2510 Atherosclerotic heart disease of native coronary artery without angina pectoris: Secondary | ICD-10-CM | POA: Diagnosis not present

## 2013-08-22 DIAGNOSIS — I739 Peripheral vascular disease, unspecified: Secondary | ICD-10-CM | POA: Diagnosis not present

## 2013-08-22 DIAGNOSIS — Z5189 Encounter for other specified aftercare: Secondary | ICD-10-CM | POA: Insufficient documentation

## 2013-08-22 DIAGNOSIS — N183 Chronic kidney disease, stage 3 unspecified: Secondary | ICD-10-CM | POA: Diagnosis not present

## 2013-08-22 DIAGNOSIS — Z951 Presence of aortocoronary bypass graft: Secondary | ICD-10-CM | POA: Insufficient documentation

## 2013-08-22 DIAGNOSIS — Z87891 Personal history of nicotine dependence: Secondary | ICD-10-CM | POA: Insufficient documentation

## 2013-08-22 DIAGNOSIS — I129 Hypertensive chronic kidney disease with stage 1 through stage 4 chronic kidney disease, or unspecified chronic kidney disease: Secondary | ICD-10-CM | POA: Insufficient documentation

## 2013-08-22 DIAGNOSIS — Z9861 Coronary angioplasty status: Secondary | ICD-10-CM | POA: Diagnosis not present

## 2013-08-22 DIAGNOSIS — I252 Old myocardial infarction: Secondary | ICD-10-CM | POA: Diagnosis not present

## 2013-08-24 ENCOUNTER — Encounter (HOSPITAL_COMMUNITY)
Admission: RE | Admit: 2013-08-24 | Discharge: 2013-08-24 | Disposition: A | Discharge status: Home / Self Care | Payer: Medicare Other | Source: Ambulatory Visit | Attending: Cardiology | Admitting: Cardiology

## 2013-08-24 LAB — GLUCOSE, CAPILLARY: Glucose-Capillary: 150 mg/dL — ABNORMAL HIGH (ref 70–99)

## 2013-08-26 ENCOUNTER — Encounter (HOSPITAL_COMMUNITY)
Admission: RE | Admit: 2013-08-26 | Discharge: 2013-08-26 | Disposition: A | Discharge status: Home / Self Care | Payer: Medicare Other | Source: Ambulatory Visit | Attending: Cardiology | Admitting: Cardiology

## 2013-08-29 ENCOUNTER — Encounter (HOSPITAL_COMMUNITY)
Admission: RE | Admit: 2013-08-29 | Discharge: 2013-08-29 | Disposition: A | Discharge status: Home / Self Care | Payer: Medicare Other | Source: Ambulatory Visit | Attending: Cardiology | Admitting: Cardiology

## 2013-08-31 ENCOUNTER — Encounter (HOSPITAL_COMMUNITY)
Admission: RE | Admit: 2013-08-31 | Discharge: 2013-08-31 | Disposition: A | Discharge status: Home / Self Care | Payer: Medicare Other | Source: Ambulatory Visit | Attending: Cardiology | Admitting: Cardiology

## 2013-09-02 ENCOUNTER — Other Ambulatory Visit: Payer: Self-pay | Admitting: *Deleted

## 2013-09-02 ENCOUNTER — Encounter (HOSPITAL_COMMUNITY)
Admission: RE | Admit: 2013-09-02 | Discharge: 2013-09-02 | Disposition: A | Discharge status: Home / Self Care | Payer: Medicare Other | Source: Ambulatory Visit | Attending: Cardiology | Admitting: Cardiology

## 2013-09-02 MED ORDER — AMLODIPINE BESYLATE 10 MG PO TABS: 10.0000 mg | ORAL_TABLET | Freq: Every day | ORAL | Status: DC

## 2013-09-02 MED ORDER — FUROSEMIDE 20 MG PO TABS: 20.0000 mg | ORAL_TABLET | Freq: Every day | ORAL | Status: DC

## 2013-09-02 NOTE — Telephone Encounter (Signed)
Rx was sent to pharmacy electronically. 

## 2013-09-05 ENCOUNTER — Encounter (HOSPITAL_COMMUNITY)
Admission: RE | Admit: 2013-09-05 | Discharge: 2013-09-05 | Disposition: A | Discharge status: Home / Self Care | Payer: Medicare Other | Source: Ambulatory Visit | Attending: Cardiology | Admitting: Cardiology

## 2013-09-05 NOTE — Progress Notes (Signed)
Pt in today for rehab with noted weight gain from 92.8 kg to 95.0 kg.  Pt denies any swelling or shortness of breath.  Lungs clear with o2 sat 99%.  Pt stated that he had large meals over the weekend.  Pt had [place] on Saturday night at a local restaurant and pasta on Sunday.   Will send rehab report for review by dr. Ellyn Hack.  Reviewed QOL survey with pt.  Pt scored low int the areas of Health and function and psychological.  In talking with pt I sensed that his feelings are more associated with growing older verses recovery from heart surgery.  Pt has diminished eyesight which has also impacted his quality of life.  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  His decision to retire is not due to his health, this was a decision he made before he had his bypass.  Pt also has his home of 38 years on the market for sale.  Pt and his wife who gives him great support desire to downsize to something smaller.  Pt believes that he will greatly benefit from his participation in rehab.  Will continue to check in with pt an monitor his mental status and outlook.

## 2013-09-07 ENCOUNTER — Encounter (HOSPITAL_COMMUNITY)
Admission: RE | Admit: 2013-09-07 | Discharge: 2013-09-07 | Disposition: A | Discharge status: Home / Self Care | Payer: Medicare Other | Source: Ambulatory Visit | Attending: Cardiology | Admitting: Cardiology

## 2013-09-09 ENCOUNTER — Encounter (HOSPITAL_COMMUNITY)
Admission: RE | Admit: 2013-09-09 | Discharge: 2013-09-09 | Disposition: A | Discharge status: Home / Self Care | Payer: Medicare Other | Source: Ambulatory Visit | Attending: Cardiology | Admitting: Cardiology

## 2013-09-09 NOTE — Progress Notes (Signed)
Lance Collier 68 y.o. male Nutrition Note Spoke with pt.  Nutrition Plan and Nutrition Survey goals reviewed with pt. Pt is following Step 2 of the Therapeutic Lifestyle Changes diet according to MEDFICTS results. Per discussion, pt more likely following Step 1 of the Therapeutic Lifestyle Changes diet. Pt eats out frequently "because my wife refuses to cook and we both work." Reasons for considering decreasing meals out discussed. Pt states he does cook "sometimes." Making Heart Healthy food choices when eating out reviewed. Pt wants to lose wt. Pt has been trying to lose wt by "eating less red meat." Wt loss tips reviewed.  Pt is diabetic. Last A1c indicates blood glucose poorly controlled. Pt checks CBG's TID. Fasting CBG's reportedly "143 mg/dL or lower," pre-lunch CBG's "usually around 90 mg/dL," and before dinner CBG's "around 120 mg/dL." Pt having his A1c rechecked next week. Pt feels his A1c "will be around 7.1 or so." This writer went over Diabetes Education test results. Pt expressed understanding of the information reviewed. Pt aware of nutrition education classes offered and is unable to attend nutrition classes due to work schedule.  Nutrition Diagnosis   Food-and nutrition-related knowledge deficit related to lack of exposure to information as related to diagnosis of: ? CVD ? DM (A1c 10.2)   Obesity related to excessive energy intake as evidenced by a BMI of 30.1  Nutrition RX/ Estimated Daily Nutrition Needs for: wt loss  1500-2000 Kcal, 40-55 gm fat, 10-13 gm sat fat, 1.5-2.0 gm trans-fat, <1500 mg sodium, 175-250 gm CHO   Nutrition Intervention   Pt's individual nutrition plan reviewed with pt.   Benefits of adopting Therapeutic Lifestyle Changes discussed when Medficts reviewed.   Pt to attend the Portion Distortion class   Pt to attend the Diabetes Q & A class - met; 09/02/13   Pt given handouts for: ? Nutrition I class ? Nutrition II class ? Diabetes Blitz class   Continue  client-centered nutrition education by RD, as part of interdisciplinary care. Goal(s)   Pt to identify food quantities necessary to achieve: ? wt loss to a goal wt of 180-198 lb (81.6-89.8 kg) at graduation from cardiac rehab.    Pt to describe the benefit of including fruits, vegetables, whole grains, and low-fat dairy products in a heart healthy meal plan.   CBG concentrations in the normal range or as close to normal as is safely possible. Monitor and Evaluate progress toward nutrition goal with team. Nutrition Risk: Change to Moderate [name], M.Ed, RD, LDN, CDE 09/09/2013 8:26 AM

## 2013-09-12 ENCOUNTER — Encounter (HOSPITAL_COMMUNITY)
Admission: RE | Admit: 2013-09-12 | Discharge: 2013-09-12 | Disposition: A | Discharge status: Home / Self Care | Payer: Medicare Other | Source: Ambulatory Visit | Attending: Cardiology | Admitting: Cardiology

## 2013-09-14 ENCOUNTER — Encounter (HOSPITAL_COMMUNITY)
Admission: RE | Admit: 2013-09-14 | Discharge: 2013-09-14 | Disposition: A | Discharge status: Home / Self Care | Payer: Medicare Other | Source: Ambulatory Visit | Attending: Cardiology | Admitting: Cardiology

## 2013-09-14 DIAGNOSIS — H3581 Retinal edema: Secondary | ICD-10-CM | POA: Diagnosis not present

## 2013-09-14 DIAGNOSIS — E1039 Type 1 diabetes mellitus with other diabetic ophthalmic complication: Secondary | ICD-10-CM | POA: Diagnosis not present

## 2013-09-15 ENCOUNTER — Ambulatory Visit (INDEPENDENT_AMBULATORY_CARE_PROVIDER_SITE_OTHER): Payer: Self-pay | Admitting: Cardiothoracic Surgery

## 2013-09-15 ENCOUNTER — Encounter: Payer: Self-pay | Admitting: Cardiothoracic Surgery

## 2013-09-15 VITALS — BP 137/71 | HR 72 | Resp 20 | Ht 69.0 in | Wt 203.0 lb

## 2013-09-15 DIAGNOSIS — E11359 Type 2 diabetes mellitus with proliferative diabetic retinopathy without macular edema: Secondary | ICD-10-CM | POA: Diagnosis not present

## 2013-09-15 DIAGNOSIS — N058 Unspecified nephritic syndrome with other morphologic changes: Secondary | ICD-10-CM | POA: Diagnosis not present

## 2013-09-15 DIAGNOSIS — E1139 Type 2 diabetes mellitus with other diabetic ophthalmic complication: Secondary | ICD-10-CM | POA: Diagnosis not present

## 2013-09-15 DIAGNOSIS — I6529 Occlusion and stenosis of unspecified carotid artery: Secondary | ICD-10-CM | POA: Diagnosis not present

## 2013-09-15 DIAGNOSIS — I10 Essential (primary) hypertension: Secondary | ICD-10-CM | POA: Diagnosis not present

## 2013-09-15 DIAGNOSIS — D509 Iron deficiency anemia, unspecified: Secondary | ICD-10-CM | POA: Diagnosis not present

## 2013-09-15 DIAGNOSIS — E785 Hyperlipidemia, unspecified: Secondary | ICD-10-CM | POA: Diagnosis not present

## 2013-09-15 DIAGNOSIS — I2510 Atherosclerotic heart disease of native coronary artery without angina pectoris: Secondary | ICD-10-CM | POA: Diagnosis not present

## 2013-09-15 DIAGNOSIS — Z951 Presence of aortocoronary bypass graft: Secondary | ICD-10-CM

## 2013-09-15 DIAGNOSIS — R609 Edema, unspecified: Secondary | ICD-10-CM

## 2013-09-15 DIAGNOSIS — E1142 Type 2 diabetes mellitus with diabetic polyneuropathy: Secondary | ICD-10-CM | POA: Diagnosis not present

## 2013-09-15 DIAGNOSIS — R600 Localized edema: Secondary | ICD-10-CM

## 2013-09-15 NOTE — Progress Notes (Signed)
[address]irieSuite [address]       [place] [address]             [phone]                    C[name] Medical Record [id number] Date of Birth: May 18, 1946  Referring [id number], Leonie Green, MD Primary Cardiology: Primary Jeanette Caprice, MD  Chief Complaint:   PostOp Follow Up Visit 07/01/2012  PREOPERATIVE DIAGNOSIS: Coronary occlusive disease with ostial left main disease.  POSTOPERATIVE DIAGNOSIS: Same  SURGICAL PROCEDURE: Coronary artery bypass grafting x3 with left  internal mammary to the left anterior descending coronary artery,  reverse saphenous vein graft to the obtuse marginal coronary artery,  reverse saphenous vein graft to the distal right coronary artery with  bilateral thigh endo vein harvesting.  SURGEON: Lanelle Bal, MD   History of Present Illness:     Patient with recent admission for unstable angina and had coronary artery bypass grafting January 9. He has known underlying chronic renal disease diabetes and cerebrovascular disease. He is making good progress postoperatively, he has returned to working 4-5 hours a day without difficulty. He is actively involved in cardiac rehabilitation currently and doing well wanting to increase his activity. He's had no recurrent angina. He does have paresthesias over the left anterior chest related to the use of the mammary artery which is gradually improving. He's continued to have mild ankle edema especially when he's been up all day. He notes that his vision is much improved from prior to his surgery, and got a good report from Dr. Zadie Rhine last week.`    History  Smoking status  . Former Smoker -- 2.00 packs/day for 27 years  . Types: Cigarettes  . Quit date: 11/23/1996  Smokeless tobacco  . Never Used       Allergies  Allergen Reactions  . Atorvastatin Other (See Comments)    Leg pain  . Ibuprofen Hives  . Pravastatin   . Simvastatin Other (See Comments)    Leg  pain    Current Outpatient Prescriptions  Medication Sig Dispense Refill  . amLODipine (NORVASC) 10 MG tablet Take 1 tablet (10 mg total) by mouth daily.  30 tablet  9  . aspirin EC 325 MG EC tablet Take 1 tablet (325 mg total) by mouth daily.  30 tablet  0  . fenofibrate 160 MG tablet Take 160 mg by mouth daily.      . furosemide (LASIX) 20 MG tablet Take 1 tablet (20 mg total) by mouth daily.  30 tablet  9  . HUMALOG KWIKPEN 100 UNIT/ML SOPN Inject 10-15 Units into the skin 3 (three) times daily. Sliding scale. 10 units with breakfast and 15 units with lunch and supper      . insulin glargine (LANTUS) 100 UNIT/ML injection Inject 40-60 Units into the skin 2 (two) times daily. 40 units in am, 60 units in pm      . metoprolol tartrate (LOPRESSOR) 25 MG tablet Take 1 tablet (25 mg total) by mouth 2 (two) times daily.  60 tablet  6  . pantoprazole (PROTONIX) 40 MG tablet Take 40 mg by mouth daily.      . rosuvastatin (CRESTOR) 10 MG tablet Take 10 mg by mouth 2 (two) times a week. Take 1 tablet twice a week       No current facility-administered medications for this visit.  Physical Exam: BP 137/71  Pulse 72  Resp 20  Ht 5\' 9"  (1.753 m)  Wt 203 lb (92.08 kg)  BMI 29.96 kg/m2  SpO2 98%  General appearance: alert and cooperative Neurologic: intact Heart: regular rate and rhythm, S1, S2 normal, no murmur, click, rub or gallop Lungs: clear to auscultation bilaterally Abdomen: soft, non-tender; bowel sounds normal; no masses,  no organomegaly Extremities: edema Has mild pedal edema at both ankles and Homans sign is negative, no sign of DVT Wound: Sternum is stable and well healed vein harvest sites also well healed   Diagnostic Studies & Laboratory data:         Recent Radiology Findings: No results found.    Recent Labs: Lab Results  Component Value Date   WBC 4.9 07/20/2013   HGB 9.0* 07/20/2013   HCT 28.0* 07/20/2013   PLT 500* 07/20/2013   GLUCOSE 193* 07/20/2013    ALT 37 06/24/2013   AST 26 06/24/2013   NA 136 07/20/2013   K 4.6 07/20/2013   CL 105 07/20/2013   CREATININE 1.73* 07/20/2013   BUN 33* 07/20/2013   CO2 25 07/20/2013   INR 1.28 07/01/2013   HGBA1C 10.2* 06/29/2013      Assessment / Plan:    Stable following coronary artery bypass grafting, without evidence of recurrent angina Mild pedal edema on low-dose Lasix, has known renal insufficiency. Overall the patient is making good progress postoperatively. He has close followup with cardiology and internal medicine I have  not made him a return appointment to see me.   Diego Ulbricht B 09/15/2013 11:24 AM

## 2013-09-16 ENCOUNTER — Encounter (HOSPITAL_COMMUNITY)
Admission: RE | Admit: 2013-09-16 | Discharge: 2013-09-16 | Disposition: A | Discharge status: Home / Self Care | Payer: Medicare Other | Source: Ambulatory Visit | Attending: Cardiology | Admitting: Cardiology

## 2013-09-19 ENCOUNTER — Encounter (HOSPITAL_COMMUNITY)
Admission: RE | Admit: 2013-09-19 | Discharge: 2013-09-19 | Disposition: A | Discharge status: Home / Self Care | Payer: Medicare Other | Source: Ambulatory Visit | Attending: Cardiology | Admitting: Cardiology

## 2013-09-21 ENCOUNTER — Encounter (HOSPITAL_COMMUNITY)
Admission: RE | Admit: 2013-09-21 | Discharge: 2013-09-21 | Disposition: A | Discharge status: Home / Self Care | Payer: Medicare Other | Source: Ambulatory Visit | Attending: Cardiology | Admitting: Cardiology

## 2013-09-21 DIAGNOSIS — I2510 Atherosclerotic heart disease of native coronary artery without angina pectoris: Secondary | ICD-10-CM | POA: Diagnosis not present

## 2013-09-21 DIAGNOSIS — I252 Old myocardial infarction: Secondary | ICD-10-CM | POA: Diagnosis not present

## 2013-09-21 DIAGNOSIS — N183 Chronic kidney disease, stage 3 unspecified: Secondary | ICD-10-CM | POA: Diagnosis not present

## 2013-09-21 DIAGNOSIS — Z5189 Encounter for other specified aftercare: Secondary | ICD-10-CM | POA: Insufficient documentation

## 2013-09-21 DIAGNOSIS — L821 Other seborrheic keratosis: Secondary | ICD-10-CM | POA: Diagnosis not present

## 2013-09-21 DIAGNOSIS — Z9861 Coronary angioplasty status: Secondary | ICD-10-CM | POA: Diagnosis not present

## 2013-09-21 DIAGNOSIS — Z87891 Personal history of nicotine dependence: Secondary | ICD-10-CM | POA: Insufficient documentation

## 2013-09-21 DIAGNOSIS — I129 Hypertensive chronic kidney disease with stage 1 through stage 4 chronic kidney disease, or unspecified chronic kidney disease: Secondary | ICD-10-CM | POA: Insufficient documentation

## 2013-09-21 DIAGNOSIS — I200 Unstable angina: Secondary | ICD-10-CM | POA: Diagnosis not present

## 2013-09-21 DIAGNOSIS — L570 Actinic keratosis: Secondary | ICD-10-CM | POA: Diagnosis not present

## 2013-09-21 DIAGNOSIS — I739 Peripheral vascular disease, unspecified: Secondary | ICD-10-CM | POA: Diagnosis not present

## 2013-09-21 DIAGNOSIS — Z951 Presence of aortocoronary bypass graft: Secondary | ICD-10-CM | POA: Diagnosis not present

## 2013-09-23 ENCOUNTER — Encounter (HOSPITAL_COMMUNITY)
Admission: RE | Admit: 2013-09-23 | Discharge: 2013-09-23 | Disposition: A | Discharge status: Home / Self Care | Payer: Medicare Other | Source: Ambulatory Visit | Attending: Cardiology | Admitting: Cardiology

## 2013-09-23 DIAGNOSIS — Z5189 Encounter for other specified aftercare: Secondary | ICD-10-CM | POA: Diagnosis not present

## 2013-09-26 ENCOUNTER — Encounter (HOSPITAL_COMMUNITY)
Admission: RE | Admit: 2013-09-26 | Discharge: 2013-09-26 | Disposition: A | Discharge status: Home / Self Care | Payer: Medicare Other | Source: Ambulatory Visit | Attending: Cardiology | Admitting: Cardiology

## 2013-09-26 DIAGNOSIS — Z5189 Encounter for other specified aftercare: Secondary | ICD-10-CM | POA: Diagnosis not present

## 2013-09-26 LAB — GLUCOSE, CAPILLARY: Glucose-Capillary: 197 mg/dL — ABNORMAL HIGH (ref 70–99)

## 2013-09-28 ENCOUNTER — Encounter (HOSPITAL_COMMUNITY)
Admission: RE | Admit: 2013-09-28 | Discharge: 2013-09-28 | Disposition: A | Discharge status: Home / Self Care | Payer: Medicare Other | Source: Ambulatory Visit | Attending: Cardiology | Admitting: Cardiology

## 2013-09-28 ENCOUNTER — Telehealth: Payer: Self-pay | Admitting: Cardiology

## 2013-09-28 DIAGNOSIS — Z5189 Encounter for other specified aftercare: Secondary | ICD-10-CM | POA: Diagnosis not present

## 2013-09-28 NOTE — Telephone Encounter (Signed)
Came into Cardiac Rehab today c/o dizziness - BP check 160/80 with orthostatic changes of 130/70 sitting and 140/64 sitting.  He has lost approx. 5 lbs since Monday (weight gain over the Easter holiday).  Only having dizziness with movement so rehab had him do sitting exercises only.  BP on the bike was 124/58 and post exercise 116/60.  He does not want an appt today.  Instructed to check BP daily and let us know if symptoms persist.  Instructed to decrease sodium intake and increase fluid intake for probable dehydration.  Will call if he needs to come in.

## 2013-09-28 NOTE — Progress Notes (Signed)
Pt arrived at cardiac rehab c/o dizziness this morning.  Worse with position change, doesn't  Notice while sitting still.  Orthostatic BP:  supine-160/80, HR-62, sitting-130/70, 64, standing-140/64, 68.  Pt reports dizziness with sitting.  Pt weight down 2kg since Monday. Pt denies change in regimen. Pt able to participate in seated exercise without difficulty, BP decrease with exercise.  Pt denies dizziness with exercise.  pc to Dr. Ellyn Hack nurse, Pamala Hurry.  Per Pamala Hurry, pt advised to monitor BP at home-same time daily, check BP if symptomatic, if symptoms unrelieved or worsen call office for appt, drink water, and decrease sodium content in food.  Pt denies dizziness prior to leaving department.  Pt verbalized understanding.

## 2013-09-30 ENCOUNTER — Encounter (HOSPITAL_COMMUNITY)
Admission: RE | Admit: 2013-09-30 | Discharge: 2013-09-30 | Disposition: A | Discharge status: Home / Self Care | Payer: Medicare Other | Source: Ambulatory Visit | Attending: Cardiology | Admitting: Cardiology

## 2013-09-30 ENCOUNTER — Telehealth: Payer: Self-pay | Admitting: *Deleted

## 2013-09-30 DIAGNOSIS — Z5189 Encounter for other specified aftercare: Secondary | ICD-10-CM | POA: Diagnosis not present

## 2013-09-30 NOTE — Telephone Encounter (Signed)
Faxed to cardiac rehab-  Answering faxed advise  09/28/13 Per Dr Ellyn Hack--- not incline to treat blood pressure since the range is broad

## 2013-10-03 ENCOUNTER — Encounter (HOSPITAL_COMMUNITY)
Admission: RE | Admit: 2013-10-03 | Discharge: 2013-10-03 | Disposition: A | Discharge status: Home / Self Care | Payer: Medicare Other | Source: Ambulatory Visit | Attending: Cardiology | Admitting: Cardiology

## 2013-10-03 DIAGNOSIS — Z5189 Encounter for other specified aftercare: Secondary | ICD-10-CM | POA: Diagnosis not present

## 2013-10-05 ENCOUNTER — Encounter (HOSPITAL_COMMUNITY)
Admission: RE | Admit: 2013-10-05 | Discharge: 2013-10-05 | Disposition: A | Discharge status: Home / Self Care | Payer: Medicare Other | Source: Ambulatory Visit | Attending: Cardiology | Admitting: Cardiology

## 2013-10-05 DIAGNOSIS — Z5189 Encounter for other specified aftercare: Secondary | ICD-10-CM | POA: Diagnosis not present

## 2013-10-07 ENCOUNTER — Encounter (HOSPITAL_COMMUNITY)
Admission: RE | Admit: 2013-10-07 | Discharge: 2013-10-07 | Disposition: A | Discharge status: Home / Self Care | Payer: Medicare Other | Source: Ambulatory Visit | Attending: Cardiology | Admitting: Cardiology

## 2013-10-07 DIAGNOSIS — Z5189 Encounter for other specified aftercare: Secondary | ICD-10-CM | POA: Diagnosis not present

## 2013-10-10 ENCOUNTER — Encounter (HOSPITAL_COMMUNITY)
Admission: RE | Admit: 2013-10-10 | Discharge: 2013-10-10 | Disposition: A | Discharge status: Home / Self Care | Payer: Medicare Other | Source: Ambulatory Visit | Attending: Cardiology | Admitting: Cardiology

## 2013-10-10 DIAGNOSIS — Z5189 Encounter for other specified aftercare: Secondary | ICD-10-CM | POA: Diagnosis not present

## 2013-10-12 ENCOUNTER — Encounter (HOSPITAL_COMMUNITY)
Admission: RE | Admit: 2013-10-12 | Discharge: 2013-10-12 | Disposition: A | Discharge status: Home / Self Care | Payer: Medicare Other | Source: Ambulatory Visit | Attending: Cardiology | Admitting: Cardiology

## 2013-10-12 DIAGNOSIS — Z5189 Encounter for other specified aftercare: Secondary | ICD-10-CM | POA: Diagnosis not present

## 2013-10-14 ENCOUNTER — Encounter (HOSPITAL_COMMUNITY)
Admission: RE | Admit: 2013-10-14 | Discharge: 2013-10-14 | Disposition: A | Discharge status: Home / Self Care | Payer: Medicare Other | Source: Ambulatory Visit | Attending: Cardiology | Admitting: Cardiology

## 2013-10-14 DIAGNOSIS — Z5189 Encounter for other specified aftercare: Secondary | ICD-10-CM | POA: Diagnosis not present

## 2013-10-17 ENCOUNTER — Encounter (HOSPITAL_COMMUNITY)
Admission: RE | Admit: 2013-10-17 | Discharge: 2013-10-17 | Disposition: A | Discharge status: Home / Self Care | Payer: Medicare Other | Source: Ambulatory Visit | Attending: Cardiology | Admitting: Cardiology

## 2013-10-17 DIAGNOSIS — Z5189 Encounter for other specified aftercare: Secondary | ICD-10-CM | POA: Diagnosis not present

## 2013-10-19 ENCOUNTER — Encounter (HOSPITAL_COMMUNITY)
Admission: RE | Admit: 2013-10-19 | Discharge: 2013-10-19 | Disposition: A | Discharge status: Home / Self Care | Payer: Medicare Other | Source: Ambulatory Visit | Attending: Cardiology | Admitting: Cardiology

## 2013-10-19 DIAGNOSIS — Z5189 Encounter for other specified aftercare: Secondary | ICD-10-CM | POA: Diagnosis not present

## 2013-10-20 ENCOUNTER — Telehealth: Payer: Self-pay | Admitting: Cardiology

## 2013-10-20 ENCOUNTER — Ambulatory Visit (INDEPENDENT_AMBULATORY_CARE_PROVIDER_SITE_OTHER): Payer: Medicare Other | Admitting: Cardiology

## 2013-10-20 VITALS — BP 120/60 | HR 63 | Ht 69.0 in | Wt 212.0 lb

## 2013-10-20 DIAGNOSIS — R079 Chest pain, unspecified: Secondary | ICD-10-CM | POA: Diagnosis not present

## 2013-10-20 DIAGNOSIS — I2510 Atherosclerotic heart disease of native coronary artery without angina pectoris: Secondary | ICD-10-CM | POA: Diagnosis not present

## 2013-10-20 MED ORDER — NITROGLYCERIN 0.4 MG SL SUBL: 0.4000 mg | SUBLINGUAL_TABLET | SUBLINGUAL | Status: DC | PRN

## 2013-10-20 NOTE — Telephone Encounter (Signed)
Returned call and pt verified x 2.  Pt stated after rehab he had a burning sensation in his chest.  Denied SOB, arm pain, nausea or other symptoms.  Stated it was not acid reflux b/c he knows what that feels like.  Pt stated it resolved over the course of the day.  Stated today he had similar symptoms after his walk and stated he was going to blow it off, but wasn't sure if he needed to.  Denied taking NTG, stated he has never had CP and hasn't filled script in ~ 2 years.  Took Tylenol yesterday afternoon.    Pt stated he started not to call and is okay w/ not being seen, but his secretary has been on him.  Pt informed RN advises he come in for evaluation as his last OV was over 2 mos ago and he plans to travel tomorrow.  Pt verbalized understanding and agreed w/ plan.   Appt scheduled for today at 3:30pm w/ Lyda Jester, PA-C.

## 2013-10-20 NOTE — Telephone Encounter (Signed)
Had a burning sensation right in the middle of chest. It was a strange feeling , has not had any other side effects . Will be going out of town in the morning and just wanted to speak to someone about it. Please call   Thanks

## 2013-10-20 NOTE — Patient Instructions (Signed)
Your physician recommends that you schedule a follow-up appointment  For your appt. That is already scheduled

## 2013-10-21 ENCOUNTER — Encounter: Payer: Self-pay | Admitting: Cardiology

## 2013-10-21 ENCOUNTER — Encounter (HOSPITAL_COMMUNITY): Payer: Medicare Other

## 2013-10-21 DIAGNOSIS — R079 Chest pain, unspecified: Secondary | ICD-10-CM | POA: Insufficient documentation

## 2013-10-21 NOTE — Progress Notes (Signed)
Patient ID: Lance Collier, male   DOB: 1946-02-15, 68 y.o.   MRN: [id number]    10/21/2013 Lance Collier   Mar 11, 1946  [id number]  Primary Physicia Sheela Stack, MD Primary Cardiologist: Dr. Ellyn Hack  HPI:  The patient is a 68 y/o male, followed by Dr. Ellyn Hack, with known CAD who required coronary artery bypass grafting July 01, 2013 by Dr. Servando Snare. He also has known underlying chronic renal disease, diabetes and cerebrovascular disease. He has been doing fairly well since his surgery and attends cardiac rehab at [facility], faithfully, 3x/ week. He has had no exertional chest pain or dyspnea. However, about 30 min after completing his rehab session, he developed substernal, non radiating chest "burning". He denies pressure, tightness and heaviness. He denies every having pain prior to undergoing CABG. His main complaint at that time was fatigue. His recent symptoms of chest pressure lasted about 1 hour. He denies any other associated symptoms. No alleviating or exacerbating factors. He did not try taking ASA or NTG. The discomfort spontaneously resolved. He denies any further recurrence since yesterday, but was concerned and decided to come in for evaluation.      Current Outpatient Prescriptions  Medication Sig Dispense Refill  . amLODipine (NORVASC) 10 MG tablet Take 1 tablet (10 mg total) by mouth daily.  30 tablet  9  . aspirin EC 325 MG EC tablet Take 1 tablet (325 mg total) by mouth daily.  30 tablet  0  . fenofibrate 160 MG tablet Take 160 mg by mouth daily.      . furosemide (LASIX) 20 MG tablet Take 1 tablet (20 mg total) by mouth daily.  30 tablet  9  . HUMALOG KWIKPEN 100 UNIT/ML SOPN Inject 10-15 Units into the skin 3 (three) times daily. Sliding scale. 10 units with breakfast and 15 units with lunch and supper      . insulin glargine (LANTUS) 100 UNIT/ML injection Inject 40-60 Units into the skin 2 (two) times daily. 40 units in am, 60 units in pm      . metoprolol tartrate  (LOPRESSOR) 25 MG tablet Take 1 tablet (25 mg total) by mouth 2 (two) times daily.  60 tablet  6  . pantoprazole (PROTONIX) 40 MG tablet Take 40 mg by mouth daily.      . rosuvastatin (CRESTOR) 10 MG tablet Take 10 mg by mouth 2 (two) times a week. Take 1 tablet twice a week      . nitroGLYCERIN (NITROSTAT) 0.4 MG SL tablet Place 1 tablet (0.4 mg total) under the tongue every 5 (five) minutes as needed for chest pain.  90 tablet  3   No current facility-administered medications for this visit.    Allergies  Allergen Reactions  . Atorvastatin Other (See Comments)    Leg pain  . Ibuprofen Hives  . Pravastatin   . Simvastatin Other (See Comments)    Leg pain    History   Social History  . Marital Status: Single    Spouse Name: N/A    Number of Children: N/A  . Years of Education: N/A   Occupational History  . Not on file.   Social History Main Topics  . Smoking status: Former Smoker -- 2.00 packs/day for 27 years    Types: Cigarettes    Quit date: 11/23/1996  . Smokeless tobacco: Never Used  . Alcohol Use: 0.0 oz/week     Comment: 06/28/2013 "haven't had any alcohol in several weeks"  . Drug Use: No  .  Sexual Activity: Not Currently   Other Topics Concern  . 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 He notably has not been exercising like he used to. He [place] walk [place] He has social alcoholic beverages but is trying to cut that back and does not smoke.       He tells me he is a sucker for chips but does not eat sweets because of diabetes.     Review of Systems: General: negative for chills, fever, night sweats or weight changes.  Cardiovascular: negative for chest pain, dyspnea on exertion, edema, orthopnea, palpitations, paroxysmal nocturnal dyspnea or shortness of breath Dermatological: negative for rash Respiratory: negative for cough or  wheezing Urologic: negative for hematuria Abdominal: negative for nausea, vomiting, diarrhea, bright red blood per rectum, melena, or hematemesis Neurologic: negative for visual changes, syncope, or dizziness All other systems reviewed and are otherwise negative except as noted above.    Blood pressure 120/60, pulse 63, height 5\' 9"  (1.753 m), weight 212 lb (96.163 kg).  General appearance: alert, cooperative and severe distress Neck: no carotid bruit and no JVD Lungs: clear to auscultation bilaterally Heart: regular rate and rhythm, S1, S2 normal, no murmur, click, rub or gallop Extremities: 1+ bilateral LEE Pulses: 2+ and symmetric Skin: warm and dry Neurologic: Grossly normal  EKG NSR, 63 bpm. Nonspecific T Wave abnormality, also seen on prior EKG.   ASSESSMENT AND PLAN:   Chest pain H/o recent CABG in January of this year. He has been stable, participating 3 x a week in cardiac rehab w/o any exertional chest discomfort or dyspnea. Yesterday he developed 1 episode of atypical chest discomfort that spontaneously resolved. There has been no further recurrence. His EKG demonstrates NSR with nonspecific T wave abnormality, which was seen on prior EKGs. Since this was an isolated event and no issues with cardiac rehab, we will not pursue any additional w/u at this time. Pt was instructed to continue taking medications as prescribed. He was encouraged to continue with cardiac rehab. If he develops further pain and if this increases in frequency, then we may need to consider a NST.     PLAN  No further w/u at this time. Continue current meds. Pt is in agreement with and is comfortable with this plan. He is scheduled to f/u with Dr. Ellyn Hack on 5/14.   Epic Tribbett SimmonsPA-C 10/21/2013 5:47 PM

## 2013-10-21 NOTE — Assessment & Plan Note (Signed)
H/o recent CABG in January of this year. He has been stable, participating 3 x a week in cardiac rehab w/o any exertional chest discomfort or dyspnea. Yesterday he developed 1 episode of atypical chest discomfort that spontaneously resolved. There has been no further recurrence. His EKG demonstrates NSR with nonspecific T wave abnormality, which was seen on prior EKGs. Since this was an isolated event and no issues with cardiac rehab, we will not pursue any additional w/u at this time. Pt was instructed to continue taking medications as prescribed. He was encouraged to continue with cardiac rehab. If he develops further pain and if this increases in frequency, then we may need to consider a NST.

## 2013-10-24 ENCOUNTER — Encounter (HOSPITAL_COMMUNITY)
Admission: RE | Admit: 2013-10-24 | Discharge: 2013-10-24 | Disposition: A | Discharge status: Home / Self Care | Payer: Medicare Other | Source: Ambulatory Visit | Attending: Cardiology | Admitting: Cardiology

## 2013-10-24 DIAGNOSIS — I2510 Atherosclerotic heart disease of native coronary artery without angina pectoris: Secondary | ICD-10-CM | POA: Diagnosis not present

## 2013-10-24 DIAGNOSIS — I252 Old myocardial infarction: Secondary | ICD-10-CM | POA: Insufficient documentation

## 2013-10-24 DIAGNOSIS — Z5189 Encounter for other specified aftercare: Secondary | ICD-10-CM | POA: Insufficient documentation

## 2013-10-24 DIAGNOSIS — N183 Chronic kidney disease, stage 3 unspecified: Secondary | ICD-10-CM | POA: Diagnosis not present

## 2013-10-24 DIAGNOSIS — I739 Peripheral vascular disease, unspecified: Secondary | ICD-10-CM | POA: Insufficient documentation

## 2013-10-24 DIAGNOSIS — I200 Unstable angina: Secondary | ICD-10-CM | POA: Insufficient documentation

## 2013-10-24 DIAGNOSIS — Z9861 Coronary angioplasty status: Secondary | ICD-10-CM | POA: Diagnosis not present

## 2013-10-24 DIAGNOSIS — Z87891 Personal history of nicotine dependence: Secondary | ICD-10-CM | POA: Insufficient documentation

## 2013-10-24 DIAGNOSIS — I129 Hypertensive chronic kidney disease with stage 1 through stage 4 chronic kidney disease, or unspecified chronic kidney disease: Secondary | ICD-10-CM | POA: Diagnosis not present

## 2013-10-24 DIAGNOSIS — Z951 Presence of aortocoronary bypass graft: Secondary | ICD-10-CM | POA: Diagnosis not present

## 2013-10-26 ENCOUNTER — Encounter (HOSPITAL_COMMUNITY)
Admission: RE | Admit: 2013-10-26 | Discharge: 2013-10-26 | Disposition: A | Discharge status: Home / Self Care | Payer: Medicare Other | Source: Ambulatory Visit | Attending: Cardiology | Admitting: Cardiology

## 2013-10-26 DIAGNOSIS — I129 Hypertensive chronic kidney disease with stage 1 through stage 4 chronic kidney disease, or unspecified chronic kidney disease: Secondary | ICD-10-CM | POA: Diagnosis not present

## 2013-10-26 DIAGNOSIS — I739 Peripheral vascular disease, unspecified: Secondary | ICD-10-CM | POA: Diagnosis not present

## 2013-10-26 DIAGNOSIS — I2510 Atherosclerotic heart disease of native coronary artery without angina pectoris: Secondary | ICD-10-CM | POA: Diagnosis not present

## 2013-10-26 DIAGNOSIS — Z5189 Encounter for other specified aftercare: Secondary | ICD-10-CM | POA: Diagnosis not present

## 2013-10-26 DIAGNOSIS — N183 Chronic kidney disease, stage 3 unspecified: Secondary | ICD-10-CM | POA: Diagnosis not present

## 2013-10-26 DIAGNOSIS — I200 Unstable angina: Secondary | ICD-10-CM | POA: Diagnosis not present

## 2013-10-28 ENCOUNTER — Encounter (HOSPITAL_COMMUNITY)
Admission: RE | Admit: 2013-10-28 | Discharge: 2013-10-28 | Disposition: A | Discharge status: Home / Self Care | Payer: Medicare Other | Source: Ambulatory Visit | Attending: Cardiology | Admitting: Cardiology

## 2013-10-28 DIAGNOSIS — I129 Hypertensive chronic kidney disease with stage 1 through stage 4 chronic kidney disease, or unspecified chronic kidney disease: Secondary | ICD-10-CM | POA: Diagnosis not present

## 2013-10-28 DIAGNOSIS — I739 Peripheral vascular disease, unspecified: Secondary | ICD-10-CM | POA: Diagnosis not present

## 2013-10-28 DIAGNOSIS — N183 Chronic kidney disease, stage 3 unspecified: Secondary | ICD-10-CM | POA: Diagnosis not present

## 2013-10-28 DIAGNOSIS — I200 Unstable angina: Secondary | ICD-10-CM | POA: Diagnosis not present

## 2013-10-28 DIAGNOSIS — Z5189 Encounter for other specified aftercare: Secondary | ICD-10-CM | POA: Diagnosis not present

## 2013-10-28 DIAGNOSIS — I2510 Atherosclerotic heart disease of native coronary artery without angina pectoris: Secondary | ICD-10-CM | POA: Diagnosis not present

## 2013-10-31 ENCOUNTER — Encounter (HOSPITAL_COMMUNITY)
Admission: RE | Admit: 2013-10-31 | Discharge: 2013-10-31 | Disposition: A | Discharge status: Home / Self Care | Payer: Medicare Other | Source: Ambulatory Visit | Attending: Cardiology | Admitting: Cardiology

## 2013-10-31 DIAGNOSIS — I2510 Atherosclerotic heart disease of native coronary artery without angina pectoris: Secondary | ICD-10-CM | POA: Diagnosis not present

## 2013-10-31 DIAGNOSIS — N183 Chronic kidney disease, stage 3 unspecified: Secondary | ICD-10-CM | POA: Diagnosis not present

## 2013-10-31 DIAGNOSIS — I739 Peripheral vascular disease, unspecified: Secondary | ICD-10-CM | POA: Diagnosis not present

## 2013-10-31 DIAGNOSIS — I200 Unstable angina: Secondary | ICD-10-CM | POA: Diagnosis not present

## 2013-10-31 DIAGNOSIS — I129 Hypertensive chronic kidney disease with stage 1 through stage 4 chronic kidney disease, or unspecified chronic kidney disease: Secondary | ICD-10-CM | POA: Diagnosis not present

## 2013-10-31 DIAGNOSIS — Z5189 Encounter for other specified aftercare: Secondary | ICD-10-CM | POA: Diagnosis not present

## 2013-11-02 ENCOUNTER — Encounter (HOSPITAL_COMMUNITY)
Admission: RE | Admit: 2013-11-02 | Discharge: 2013-11-02 | Disposition: A | Discharge status: Home / Self Care | Payer: Medicare Other | Source: Ambulatory Visit | Attending: Cardiology | Admitting: Cardiology

## 2013-11-02 DIAGNOSIS — Z5189 Encounter for other specified aftercare: Secondary | ICD-10-CM | POA: Diagnosis not present

## 2013-11-02 DIAGNOSIS — I739 Peripheral vascular disease, unspecified: Secondary | ICD-10-CM | POA: Diagnosis not present

## 2013-11-02 DIAGNOSIS — I200 Unstable angina: Secondary | ICD-10-CM | POA: Diagnosis not present

## 2013-11-02 DIAGNOSIS — I129 Hypertensive chronic kidney disease with stage 1 through stage 4 chronic kidney disease, or unspecified chronic kidney disease: Secondary | ICD-10-CM | POA: Diagnosis not present

## 2013-11-02 DIAGNOSIS — N183 Chronic kidney disease, stage 3 unspecified: Secondary | ICD-10-CM | POA: Diagnosis not present

## 2013-11-02 DIAGNOSIS — I2510 Atherosclerotic heart disease of native coronary artery without angina pectoris: Secondary | ICD-10-CM | POA: Diagnosis not present

## 2013-11-03 ENCOUNTER — Ambulatory Visit (INDEPENDENT_AMBULATORY_CARE_PROVIDER_SITE_OTHER): Payer: Medicare Other | Admitting: Cardiology

## 2013-11-03 ENCOUNTER — Encounter: Payer: Self-pay | Admitting: Cardiology

## 2013-11-03 VITALS — BP 114/54 | HR 56 | Ht 69.0 in | Wt 209.4 lb

## 2013-11-03 DIAGNOSIS — Z951 Presence of aortocoronary bypass graft: Secondary | ICD-10-CM | POA: Diagnosis not present

## 2013-11-03 DIAGNOSIS — R0989 Other specified symptoms and signs involving the circulatory and respiratory systems: Secondary | ICD-10-CM

## 2013-11-03 DIAGNOSIS — I739 Peripheral vascular disease, unspecified: Secondary | ICD-10-CM

## 2013-11-03 DIAGNOSIS — E669 Obesity, unspecified: Secondary | ICD-10-CM

## 2013-11-03 DIAGNOSIS — E785 Hyperlipidemia, unspecified: Secondary | ICD-10-CM

## 2013-11-03 DIAGNOSIS — I2510 Atherosclerotic heart disease of native coronary artery without angina pectoris: Secondary | ICD-10-CM

## 2013-11-03 DIAGNOSIS — R413 Other amnesia: Secondary | ICD-10-CM

## 2013-11-03 DIAGNOSIS — R0609 Other forms of dyspnea: Secondary | ICD-10-CM

## 2013-11-03 DIAGNOSIS — R0600 Dyspnea, unspecified: Secondary | ICD-10-CM

## 2013-11-03 MED ORDER — ROSUVASTATIN CALCIUM 10 MG PO TABS: 10.0000 mg | ORAL_TABLET | ORAL | Status: DC

## 2013-11-03 NOTE — Patient Instructions (Signed)
Change Crestor to every other day.  Your physician wants you to follow-up in 6 month DR [address].  You will receive a reminder letter in the mail two months in advance. If you don't receive a letter, please call our office to schedule the follow-up appointment.

## 2013-11-04 ENCOUNTER — Encounter (HOSPITAL_COMMUNITY)
Admission: RE | Admit: 2013-11-04 | Discharge: 2013-11-04 | Disposition: A | Discharge status: Home / Self Care | Payer: Medicare Other | Source: Ambulatory Visit | Attending: Cardiology | Admitting: Cardiology

## 2013-11-04 DIAGNOSIS — I2510 Atherosclerotic heart disease of native coronary artery without angina pectoris: Secondary | ICD-10-CM | POA: Diagnosis not present

## 2013-11-04 DIAGNOSIS — Z5189 Encounter for other specified aftercare: Secondary | ICD-10-CM | POA: Diagnosis not present

## 2013-11-04 DIAGNOSIS — N183 Chronic kidney disease, stage 3 unspecified: Secondary | ICD-10-CM | POA: Diagnosis not present

## 2013-11-04 DIAGNOSIS — I129 Hypertensive chronic kidney disease with stage 1 through stage 4 chronic kidney disease, or unspecified chronic kidney disease: Secondary | ICD-10-CM | POA: Diagnosis not present

## 2013-11-04 DIAGNOSIS — I200 Unstable angina: Secondary | ICD-10-CM | POA: Diagnosis not present

## 2013-11-04 DIAGNOSIS — I739 Peripheral vascular disease, unspecified: Secondary | ICD-10-CM | POA: Diagnosis not present

## 2013-11-05 ENCOUNTER — Encounter: Payer: Self-pay | Admitting: Cardiology

## 2013-11-05 DIAGNOSIS — R413 Other amnesia: Secondary | ICD-10-CM | POA: Insufficient documentation

## 2013-11-05 NOTE — Progress Notes (Signed)
PATIENT: Lance Collier MRN: [id number]  DOB: 1945/07/22   DOV:11/05/2013 PCP: Sheela Stack, MD  Clinic Note: Chief Complaint  Patient presents with  . Follow-up    2 week visit (saw Brittainy on 4/30) c/o migraine; c/o bilateral LE edema   HPI: Lance Collier is a 68 y.o.  male with a PMH below who presents today for a followup visit. Cardiac Histroy:  Dec 2012 - Complex RCA PCI (Dr. 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 Servando Snare)  Echo 06/29/2013: EF 60-65%, Aortic Sclerosis. No regional WMA  Graduates from [employer] this week.  Interval History: Since his most recent visit, his been doing relatively well. He really denies any significant symptoms besides musculoskeletal discomfort. He does not he will get mildly short of breath he goes up and down a few flights of steps. But walking around on even ground he has no major problem. He does note that edema has improved while on Lasix - occasionally takes a 2nd dose.  He really doesn't think that the edema is a big deal for him. He did not note any PND or orthopnea no edema. He denied any anginal type chest pain, simply discomfort with movement or deep inspiration as well as the numbness along the left chest wall. He denies any significant palpitations or rapid heart beats/irregular heartbeats. No lightheadedness,  syncope or near-syncope. No melena, hematochezia hematuria. No claudication. Last week while at work, he had a recurring episode when he had a good apposition and was having a hard time remembering what he was supposed to say. He was then unable to articulate what he finally realized he needed to say from his notes. -- His to 3 episodes like that.  He is taking his Crestor 2 x / week without difficulty.  Past Medical History  Diagnosis Date  . DM (diabetes mellitus) type II uncontrolled with  eye manifestation 2013    Diabetic retinopathy with retinal hemorrhages since;, recurrent in August 2014 treated with Avastin shots  . Hypertension   . CAD S/P percutaneous coronary angioplasty 06/12/2011    status post complex PCI to the RCA requiring the use of GuideLiner catheter.  2  . Presence of drug coated stent in right coronary artery 06/12/2011    Subtotal proximal RCA (tortuous) --> complex PCI requiring Guideliner: 2 overlapping Promus element DES stents.  . 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  Likely thromboembolic event with PDA occlusion. 06/12/2011    Very difficult, complex procedure requiring multiple guidewires, guide-liner, etc.  Angiographic evidence of distal thrombo / anthero-embolism as likely etiology of Troponin of ~1.58. Pt remains asymptomatic.   . 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 Aortic sclerosis  - without Stenosis  March 2014    Echo: Aortic sclerosis without stenosis. EF 55-60% with normal WM; mild concentric hypertrophy with normal relaxation.  Marland Kitchen PAD (peripheral artery disease)     with claudication  . High cholesterol   . Migraine ~ 2004    "once"  . Anemia   . 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 GERD (gastroesophageal reflux disease)   . Anxiety     Prior Cardiac Evaluation and Past Surgical History: reviewed in Epic   Allergies  Allergen Reactions  . Atorvastatin Other (See Comments)    Leg pain  . Ibuprofen Hives  . Pravastatin   . Simvastatin Other (See Comments)    Leg pain    Current Outpatient Prescriptions  Medication Sig Dispense Refill  . amLODipine (NORVASC) 10 MG tablet Take 1 tablet (10 mg total) by mouth daily.  30 tablet  9  . aspirin EC 325 MG EC tablet Take 1 tablet (325 mg total) by mouth daily.  30 tablet  0  . fenofibrate 160 MG tablet Take 160 mg by mouth daily.      . furosemide (LASIX) 20  MG tablet Take 1 tablet (20 mg total) by mouth daily.  30 tablet  9  . HUMALOG KWIKPEN 100 UNIT/ML SOPN Inject 10-15 Units into the skin 3 (three) times daily. Sliding scale. 10 units with breakfast and 15 units with lunch and supper      . insulin glargine (LANTUS) 100 UNIT/ML injection Inject 40-60 Units into the skin 2 (two) times daily. 40 units in am, 60 units in pm      . metoprolol tartrate (LOPRESSOR) 25 MG tablet Take 1 tablet (25 mg total) by mouth 2 (two) times daily.  60 tablet  6  . nitroGLYCERIN (NITROSTAT) 0.4 MG SL tablet Place 1 tablet (0.4 mg total) under the tongue every 5 (five) minutes as needed for chest pain.  90 tablet  3  . pantoprazole (PROTONIX) 40 MG tablet Take 40 mg by mouth daily.      . rosuvastatin (CRESTOR) 10 MG tablet Take 1 tablet (10 mg total) by mouth every other day.  15 tablet  11   No current facility-administered medications for this visit.   Social and Family History reviewed in [place]. No changes.  ROS: A comprehensive Review of Systems - Negative except memory issues noted above; apparently his eyes has really improved post CABG. The ophthalmologist thinks is because he has improved cardiac output. Otherwise symptoms that are pertinent are noted above. He says that each of those experiences been having some memory loss concerns are associated with significant headaches.  PHYSICAL EXAM BP 114/54  Pulse 56  Ht 5\' 9"  (1.753 m)  Wt 209 lb 6.4 oz (94.983 kg)  BMI 30.91 kg/m2 General appearance: alert, cooperative, appears stated age, no distress and Well-nourished, and well groomed. Neck: no adenopathy, no carotid bruit, no JVD and supple, symmetrical, trachea midline Lungs: CTA B., normal percussion bilaterally and Nonlabored, good air movement Heart: RRR S1, S2 normal, no murmur, click, rub or gallop, normal apical impulse and Mild discomfort left precordial wall and along the sternum. Well-healed scar. Abdomen: soft, non-tender; bowel sounds normal;  no masses,  no organomegaly and Rotund abdomen Extremities: edema Trace to 1+ bilaterally. and no ulcers, gangrene or trophic changes Pulses: 2+ and symmetric Neurologic: Grossly normal  [id number] today: No  Recent Labs: None currently available  ASSESSMENT / PLAN:  Left main coronary artery disease; with RCA in-stent restenosis No further anginal symptoms. Feeling much better after his CABG. Feels like he has much more energy than he had before. I am increasing his statin dose to every other day as he can tolerate. He is on aspirin and low-dose beta blocker. Not currently on ACE inhibitor because of renal insufficiency at times his cath and CABG. We can consider switching from amlodipine or adding if need be in the future. His blood pressure is well-controlled.  S/P CABG (coronary artery bypass graft)  x 3, 07/01/13, LIMA-LAD;  VG-RCA; VG- LCX Very much over the soreness from the surgery. About ready to graduate from cardiac rehabilitation. He has 2 gin memberships the extremity or to do with it as he would really like to go to the maintenance program for cardiac rehabilitation.  DOE (dyspnea on exertion) Notably improved status post CABG.  PAD (peripheral artery disease) both Rt and Lt disease with Lt ICA of 60-79% No claudication. Continue with cardiac risk factor modification which should help PAD as well.  Dyslipidemia, Zocor intol On Crestor twice a week, increasing to every other day. Continue fenofibrate. I think his labs are being followed by Dr. Forde Dandy.  Obesity (BMI 30-39.9) Deciliter with his cardiac rehabilitation. Continues to try to figure out ways to do his own maintenance program versus joint [place] program. Continue to monitor his nutrition. I think he put back on some of his postop loss. He's got frustrated by that I recommended he try to shoot of losing 10% of his body weight per year.-Again down to the mid 180s by this time next year.  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 I don't think this sounds like TIA symptoms. He is relatively high functioning so would not suspect he would be having early signs of dementia. He is on low-dose statin which we would need to consider the possibility of converting to pravastatin or Livalo that are not noted for this potential side effect.  If he has further episodes that recommend either PCP versus neurologic evaluation.    No orders of the defined types were placed in this encounter.   Meds ordered this encounter  Medications  . rosuvastatin (CRESTOR) 10 MG tablet    Sig: Take 1 tablet (10 mg total) by mouth every other day.    Dispense:  15 tablet    Refill:  11    Followup: 3 months  Karletta Millay W. Ellyn Hack, M.D., M.S. Interventional Cardiology [facility]

## 2013-11-05 NOTE — Assessment & Plan Note (Signed)
No further anginal symptoms. Feeling much better after his CABG. Feels like he has much more energy than he had before. I am increasing his statin dose to every other day as he can tolerate. He is on aspirin and low-dose beta blocker. Not currently on ACE inhibitor because of renal insufficiency at times his cath and CABG. We can consider switching from amlodipine or adding if need be in the future. His blood pressure is well-controlled.

## 2013-11-05 NOTE — Assessment & Plan Note (Signed)
No claudication. Continue with cardiac risk factor modification which should help PAD as well.

## 2013-11-05 NOTE — Assessment & Plan Note (Signed)
Notably improved status post CABG.

## 2013-11-05 NOTE — Assessment & Plan Note (Signed)
Very much over the soreness from the surgery. About ready to graduate from cardiac rehabilitation. He has 2 gin memberships the extremity or to do with it as he would really like to go to the maintenance program for cardiac rehabilitation.

## 2013-11-05 NOTE — Assessment & Plan Note (Signed)
On Crestor twice a week, increasing to every other day. Continue fenofibrate. I think his labs are being followed by Dr. Forde Dandy.

## 2013-11-05 NOTE — Assessment & Plan Note (Signed)
Deciliter with his cardiac rehabilitation. Continues to try to figure out ways to do his own maintenance program versus joint [place] program. Continue to monitor his nutrition. I think he put back on some of his postop loss. He's got frustrated by that I recommended he try to shoot of losing 10% of his body weight per year.-Again down to the mid 180s by this time next year.

## 2013-11-05 NOTE — Assessment & Plan Note (Signed)
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 I don't think this sounds like TIA symptoms. He is relatively high functioning so would not suspect he would be having early signs of dementia. He is on low-dose statin which we would need to consider the possibility of converting to pravastatin or Livalo that are not noted for this potential side effect.  If he has further episodes that recommend either PCP versus neurologic evaluation.

## 2013-11-07 ENCOUNTER — Encounter (HOSPITAL_COMMUNITY)
Admission: RE | Admit: 2013-11-07 | Discharge: 2013-11-07 | Disposition: A | Discharge status: Home / Self Care | Payer: Medicare Other | Source: Ambulatory Visit | Attending: Cardiology | Admitting: Cardiology

## 2013-11-07 DIAGNOSIS — N183 Chronic kidney disease, stage 3 unspecified: Secondary | ICD-10-CM | POA: Diagnosis not present

## 2013-11-07 DIAGNOSIS — I2510 Atherosclerotic heart disease of native coronary artery without angina pectoris: Secondary | ICD-10-CM | POA: Diagnosis not present

## 2013-11-07 DIAGNOSIS — I739 Peripheral vascular disease, unspecified: Secondary | ICD-10-CM | POA: Diagnosis not present

## 2013-11-07 DIAGNOSIS — I200 Unstable angina: Secondary | ICD-10-CM | POA: Diagnosis not present

## 2013-11-07 DIAGNOSIS — Z5189 Encounter for other specified aftercare: Secondary | ICD-10-CM | POA: Diagnosis not present

## 2013-11-07 DIAGNOSIS — I129 Hypertensive chronic kidney disease with stage 1 through stage 4 chronic kidney disease, or unspecified chronic kidney disease: Secondary | ICD-10-CM | POA: Diagnosis not present

## 2013-11-07 NOTE — Progress Notes (Signed)
Pt completed 36 exercise session in the cardiac rehab phase II program.  Pt has made good progress as evident by his improvement of his MET level from 2.9 to 3.8.  Pt attended most education classes and is familiar with the program education classes as a prior participant.  Pt plans to continue his exercise as he already is doing on the days he does not attend cardiac rehab at local gym.  Pt feels confident that will be able to maintain this since he has developed a good routine already.  Pt feels he has met his short term goal to get in better shape.  Pt has struggled with weight loss and this continues to be a goal for pt.  Despite exercising on the days he is not in rehab and monitoring his eating habits pt gained weight and did not see his desired weight loss.  Pt met with Nutritionist several time and reports eating "healthy".  Medication list reconciled. Cherre Huger, BSN

## 2013-11-09 ENCOUNTER — Encounter (HOSPITAL_COMMUNITY): Payer: Medicare Other

## 2013-11-11 ENCOUNTER — Encounter (HOSPITAL_COMMUNITY): Payer: Medicare Other

## 2013-11-14 ENCOUNTER — Encounter (HOSPITAL_COMMUNITY): Payer: Medicare Other

## 2013-11-16 ENCOUNTER — Encounter (HOSPITAL_COMMUNITY): Payer: Medicare Other

## 2013-11-18 ENCOUNTER — Encounter (HOSPITAL_COMMUNITY): Payer: Medicare Other

## 2013-12-20 ENCOUNTER — Encounter: Payer: Self-pay | Admitting: Cardiology

## 2013-12-21 ENCOUNTER — Encounter: Payer: Self-pay | Admitting: Family

## 2013-12-22 ENCOUNTER — Encounter: Payer: Self-pay | Admitting: Family

## 2013-12-22 ENCOUNTER — Ambulatory Visit (HOSPITAL_COMMUNITY)
Admit: 2013-12-22 | Discharge: 2013-12-22 | Disposition: A | Discharge status: Home / Self Care | Payer: Medicare Other | Source: Ambulatory Visit | Attending: Family | Admitting: Family

## 2013-12-22 ENCOUNTER — Ambulatory Visit (INDEPENDENT_AMBULATORY_CARE_PROVIDER_SITE_OTHER)
Admit: 2013-12-22 | Discharge: 2013-12-22 | Disposition: A | Discharge status: Home / Self Care | Payer: Medicare Other | Attending: Family | Admitting: Family

## 2013-12-22 ENCOUNTER — Ambulatory Visit (INDEPENDENT_AMBULATORY_CARE_PROVIDER_SITE_OTHER): Payer: Medicare Other | Admitting: Vascular Surgery

## 2013-12-22 VITALS — BP 149/71 | HR 51 | Resp 16 | Ht 69.0 in | Wt 209.0 lb

## 2013-12-22 DIAGNOSIS — I739 Peripheral vascular disease, unspecified: Secondary | ICD-10-CM | POA: Insufficient documentation

## 2013-12-22 DIAGNOSIS — I6522 Occlusion and stenosis of left carotid artery: Secondary | ICD-10-CM | POA: Insufficient documentation

## 2013-12-22 DIAGNOSIS — Z48812 Encounter for surgical aftercare following surgery on the circulatory system: Secondary | ICD-10-CM | POA: Diagnosis not present

## 2013-12-22 DIAGNOSIS — I6529 Occlusion and stenosis of unspecified carotid artery: Secondary | ICD-10-CM

## 2013-12-22 NOTE — Progress Notes (Signed)
[facility] [place] HISTORY AND PHYSICAL   History of Present Illness:  Patient is a 68 y.o. year old male who presents for evaluation of asymptomatic carotid stenosis in bilateral lower extremity claudication.  His carotid stenosis was originally detected on workup for coronary bypass grafting. He denies any symptoms of TIA amaurosis or stroke. He also has bilateral lower extremity claudication. He states that he did get into cardiac rehabilitation in his walking distance has improved some. He is currently walking 30 minutes daily in the morning to try to improve his walking distance.  Other medical problems include hypertension, diabetes, coronary disease, elevated cholesterol, migraine. These are all currently stable.  Past Medical History  Diagnosis Date  . DM (diabetes mellitus) type II uncontrolled with eye manifestation 2013    Diabetic retinopathy with retinal hemorrhages since;, recurrent in August 2014 treated with Avastin shots  . Hypertension   . CAD S/P percutaneous coronary angioplasty 06/12/2011    status post complex PCI to the RCA requiring the use of GuideLiner catheter.  2  . Presence of drug coated stent in right coronary artery 06/12/2011    Subtotal proximal RCA (tortuous) --> complex PCI requiring Guideliner: 2 overlapping Promus element DES stents.  . 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  Likely thromboembolic event with PDA occlusion. 06/12/2011    Very difficult, complex procedure requiring multiple guidewires, guide-liner, etc.  Angiographic evidence of distal thrombo / anthero-embolism as likely etiology of Troponin of ~1.58. Pt remains asymptomatic.   . 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 Aortic sclerosis  - without Stenosis  March 2014    Echo: Aortic sclerosis without stenosis. EF 55-60% with normal WM; mild  concentric hypertrophy with normal relaxation.  Marland Kitchen PAD (peripheral artery disease)     with claudication  . High cholesterol   . Migraine ~ 2004    "once"  . Anemia   . 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 GERD (gastroesophageal reflux disease)   . Anxiety     Past Surgical History  Procedure Laterality Date  . Tonsillectomy    . Nm myoview ltd  January '13    Diaphragmatic attenuation versus inferior infarct. No ischemia. Normal EF normal wall motion.  Marland Kitchen Nm myoview ltd  June 2014    no ischemia  . Coronary angioplasty with stent placement Right September 2012    2 overlapping Promus DES 2.7 mm at 12 mm x2; postdilated to 3 mm  . Cardiac catheterization  06/28/2013    ostial LM disese, RCA ~40-50%ISR  . Retinal laser procedure Bilateral     "more than once"   . Coronary artery bypass graft N/A 07/01/2013    Procedure: CORONARY ARTERY BYPASS GRAFTING (CABG);  Surgeon: Grace Isaac, MD;  Location: [facility];  Service: Open Heart Surgery;  Laterality: N/A;  Times 3 using left internal mammary artery and endoscopically harvested bilateral saphenous vein  . Intraoperative transesophageal echocardiogram N/A 07/01/2013    Procedure: INTRAOPERATIVE TRANSESOPHAGEAL ECHOCARDIOGRAM;  Surgeon: Grace Isaac, MD;  Location: [facility];  Service: Open Heart Surgery;  Laterality: N/A;  . Transthoracic echocardiogram  06/29/2013    Focal basal hypertrophy with normal LV size. EF 60-65% no regional WMA. Mild LA dilation    Social History History  Substance Use Topics  . Smoking status: Former Smoker -- 2.00 packs/day for  27 years    Types: Cigarettes    Quit date: 11/23/1996  . Smokeless tobacco: Never Used  . Alcohol Use: 0.0 oz/week     Comment: 06/28/2013 "haven't had any alcohol in several weeks"    Family History Family History  Problem Relation Age of Onset  . Cancer Sister     Lung cancer  . Cancer Mother   . Cancer Father   . Hyperlipidemia Father   . Hypertension Father   . Cancer Maternal  Grandmother   . Cancer Paternal Grandfather     Allergies  Allergies  Allergen Reactions  . Atorvastatin Other (See Comments)    Leg pain  . Ibuprofen Hives  . Pravastatin   . Simvastatin Other (See Comments)    Leg pain     Current Outpatient Prescriptions  Medication Sig Dispense Refill  . acetaminophen (TYLENOL) 325 MG tablet Take 650 mg by mouth as needed.      Marland Kitchen amLODipine (NORVASC) 10 MG tablet Take 1 tablet (10 mg total) by mouth daily.  30 tablet  9  . aspirin EC 325 MG EC tablet Take 1 tablet (325 mg total) by mouth daily.  30 tablet  0  . fenofibrate 160 MG tablet Take 160 mg by mouth daily.      . furosemide (LASIX) 20 MG tablet Take 1 tablet (20 mg total) by mouth daily.  30 tablet  9  . HUMALOG KWIKPEN 100 UNIT/ML SOPN Inject 10-15 Units into the skin 3 (three) times daily. Sliding scale. 10 units with breakfast and 15 units with lunch and supper      . insulin glargine (LANTUS) 100 UNIT/ML injection Inject 40-60 Units into the skin 2 (two) times daily. 40 units in am, 60 units in pm      . metoprolol tartrate (LOPRESSOR) 25 MG tablet Take 1 tablet (25 mg total) by mouth 2 (two) times daily.  60 tablet  6  . nitroGLYCERIN (NITROSTAT) 0.4 MG SL tablet Place 1 tablet (0.4 mg total) under the tongue every 5 (five) minutes as needed for chest pain.  90 tablet  3  . pantoprazole (PROTONIX) 40 MG tablet Take 40 mg by mouth daily.      . rosuvastatin (CRESTOR) 10 MG tablet Take 1 tablet (10 mg total) by mouth every other day.  15 tablet  11   No current facility-administered medications for this visit.    ROS:   General:  No weight loss, Fever, chills  HEENT: No recent headaches, no nasal bleeding, no visual changes, no sore throat  Neurologic: No dizziness, blackouts, seizures. No recent symptoms of stroke or mini- stroke. No recent episodes of slurred speech, or temporary blindness.  Cardiac: No recent episodes of chest pain/pressure, no shortness of breath at rest.   No shortness of breath with exertion.  Denies history of atrial fibrillation or irregular heartbeat  Vascular: No history of rest pain in feet.  No history of claudication.  No history of non-healing ulcer, No history of DVT   Pulmonary: No home oxygen, + productive cough, no hemoptysis,  No asthma or wheezing  Musculoskeletal:  [ ]  Arthritis, [ ]  Low back pain,  [ ]  Joint pain  Hematologic:No history of hypercoagulable state.  No history of easy bleeding.  No history of anemia  Gastrointestinal: No hematochezia or melena,  No gastroesophageal reflux, no trouble swallowing  Urinary: [x ] chronic Kidney disease, [ ]  on HD - [ ]  MWF or [ ]  TTHS, [ ]   Burning with urination, [ ]  Frequent urination, [ ]  Difficulty urinating;   Skin: No rashes  Psychological: No history of anxiety,  No history of depression   Physical Examination  Filed Vitals:   12/22/13 1059 12/22/13 1100  BP: 158/72 149/71  Pulse: 52 51  Resp:  16  Height:  5\' 9"  (1.753 m)  Weight:  209 lb (94.802 kg)  SpO2:  98%    Body mass index is 30.85 kg/(m^2).  General:  Alert and oriented, no acute distress HEENT: Normal Neck: No bruit or JVD Pulmonary: Clear to auscultation bilaterally Cardiac: Regular Rate and Rhythm without murmur Abdomen: Soft, non-tender, non-distended, no mass Skin: No rash Extremity Pulses:  2+ radial, brachial, femoral pulses bilaterally Musculoskeletal: No deformity or edema  Neurologic: Upper and lower extremity motor 5/5 and symmetric  DATA:  Patient had bilateral ABIs performed today which were 0.86 on the right 0.91 on the left triphasic waveforms. He also had a carotid duplex exam which showed a 60-80% left internal carotid artery stenosis a 40% right internal carotid artery stenosis. He also had slightly increased velocities in the left subclavian artery of 300 cm/s. However brachial artery pressures were equal. I reviewed and interpreted this study.   ASSESSMENT:  #1 claudication  not currently lifestyle limiting satisfied with his walking program rather than intervention at this point #2 moderate carotid stenosis asymptomatic on antiplatelet and statin therapy   PLAN:  Carotid duplex scan in 6 months. Repeat ABIs in 6 months. Continue walking program and risk factor modification.  Ruta Hinds, MD [facility] [place] Office: [phone] Pager: [phone]

## 2013-12-28 DIAGNOSIS — L570 Actinic keratosis: Secondary | ICD-10-CM | POA: Diagnosis not present

## 2014-01-16 DIAGNOSIS — I739 Peripheral vascular disease, unspecified: Secondary | ICD-10-CM | POA: Diagnosis not present

## 2014-01-16 DIAGNOSIS — I2510 Atherosclerotic heart disease of native coronary artery without angina pectoris: Secondary | ICD-10-CM | POA: Diagnosis not present

## 2014-01-16 DIAGNOSIS — E1139 Type 2 diabetes mellitus with other diabetic ophthalmic complication: Secondary | ICD-10-CM | POA: Diagnosis not present

## 2014-01-16 DIAGNOSIS — I10 Essential (primary) hypertension: Secondary | ICD-10-CM | POA: Diagnosis not present

## 2014-01-16 DIAGNOSIS — E785 Hyperlipidemia, unspecified: Secondary | ICD-10-CM | POA: Diagnosis not present

## 2014-01-16 DIAGNOSIS — D509 Iron deficiency anemia, unspecified: Secondary | ICD-10-CM | POA: Diagnosis not present

## 2014-01-16 DIAGNOSIS — E11359 Type 2 diabetes mellitus with proliferative diabetic retinopathy without macular edema: Secondary | ICD-10-CM | POA: Diagnosis not present

## 2014-01-16 DIAGNOSIS — N058 Unspecified nephritic syndrome with other morphologic changes: Secondary | ICD-10-CM | POA: Diagnosis not present

## 2014-01-23 DIAGNOSIS — H3581 Retinal edema: Secondary | ICD-10-CM | POA: Diagnosis not present

## 2014-01-23 DIAGNOSIS — E1039 Type 1 diabetes mellitus with other diabetic ophthalmic complication: Secondary | ICD-10-CM | POA: Diagnosis not present

## 2014-01-23 DIAGNOSIS — E11359 Type 2 diabetes mellitus with proliferative diabetic retinopathy without macular edema: Secondary | ICD-10-CM | POA: Diagnosis not present

## 2014-02-10 ENCOUNTER — Other Ambulatory Visit: Payer: Self-pay | Admitting: *Deleted

## 2014-02-10 MED ORDER — METOPROLOL TARTRATE 25 MG PO TABS: 25.0000 mg | ORAL_TABLET | Freq: Two times a day (BID) | ORAL | Status: DC

## 2014-02-10 NOTE — Telephone Encounter (Signed)
Rx refill sent to patient pharmacy   

## 2014-02-21 ENCOUNTER — Encounter: Payer: Self-pay | Admitting: Cardiology

## 2014-02-24 ENCOUNTER — Telehealth: Payer: Self-pay | Admitting: Pulmonary Disease

## 2014-02-24 NOTE — Telephone Encounter (Signed)
I spoke with [facility] and he advised he will be happy to see the pt with a referrall. I advised the pt. He states he will think about it and if he still wants an appt will call us back. [place] Bing, CMA

## 2014-03-24 DIAGNOSIS — N183 Chronic kidney disease, stage 3 (moderate): Secondary | ICD-10-CM | POA: Diagnosis not present

## 2014-03-24 DIAGNOSIS — E1139 Type 2 diabetes mellitus with other diabetic ophthalmic complication: Secondary | ICD-10-CM | POA: Diagnosis not present

## 2014-03-24 DIAGNOSIS — I2510 Atherosclerotic heart disease of native coronary artery without angina pectoris: Secondary | ICD-10-CM | POA: Diagnosis not present

## 2014-03-24 DIAGNOSIS — Z6831 Body mass index (BMI) 31.0-31.9, adult: Secondary | ICD-10-CM | POA: Diagnosis not present

## 2014-03-24 DIAGNOSIS — R05 Cough: Secondary | ICD-10-CM | POA: Diagnosis not present

## 2014-03-24 DIAGNOSIS — I6529 Occlusion and stenosis of unspecified carotid artery: Secondary | ICD-10-CM | POA: Diagnosis not present

## 2014-03-24 DIAGNOSIS — J189 Pneumonia, unspecified organism: Secondary | ICD-10-CM | POA: Diagnosis not present

## 2014-03-24 DIAGNOSIS — I10 Essential (primary) hypertension: Secondary | ICD-10-CM | POA: Diagnosis not present

## 2014-05-03 ENCOUNTER — Telehealth: Payer: Self-pay | Admitting: Cardiology

## 2014-05-04 NOTE — Telephone Encounter (Signed)
Close encounter 

## 2014-05-23 DIAGNOSIS — I739 Peripheral vascular disease, unspecified: Secondary | ICD-10-CM | POA: Diagnosis not present

## 2014-05-23 DIAGNOSIS — E11359 Type 2 diabetes mellitus with proliferative diabetic retinopathy without macular edema: Secondary | ICD-10-CM | POA: Diagnosis not present

## 2014-05-23 DIAGNOSIS — N08 Glomerular disorders in diseases classified elsewhere: Secondary | ICD-10-CM | POA: Diagnosis not present

## 2014-05-23 DIAGNOSIS — I25810 Atherosclerosis of coronary artery bypass graft(s) without angina pectoris: Secondary | ICD-10-CM | POA: Diagnosis not present

## 2014-05-23 DIAGNOSIS — E1139 Type 2 diabetes mellitus with other diabetic ophthalmic complication: Secondary | ICD-10-CM | POA: Diagnosis not present

## 2014-05-23 DIAGNOSIS — I6529 Occlusion and stenosis of unspecified carotid artery: Secondary | ICD-10-CM | POA: Diagnosis not present

## 2014-05-23 DIAGNOSIS — E785 Hyperlipidemia, unspecified: Secondary | ICD-10-CM | POA: Diagnosis not present

## 2014-05-23 DIAGNOSIS — Z23 Encounter for immunization: Secondary | ICD-10-CM | POA: Diagnosis not present

## 2014-05-23 DIAGNOSIS — D509 Iron deficiency anemia, unspecified: Secondary | ICD-10-CM | POA: Diagnosis not present

## 2014-05-23 DIAGNOSIS — Z1389 Encounter for screening for other disorder: Secondary | ICD-10-CM | POA: Diagnosis not present

## 2014-05-29 DIAGNOSIS — E11351 Type 2 diabetes mellitus with proliferative diabetic retinopathy with macular edema: Secondary | ICD-10-CM | POA: Diagnosis not present

## 2014-05-31 ENCOUNTER — Encounter (HOSPITAL_COMMUNITY): Payer: Self-pay | Admitting: Cardiology

## 2014-06-01 ENCOUNTER — Encounter (HOSPITAL_COMMUNITY): Payer: Self-pay | Admitting: Cardiology

## 2014-06-08 ENCOUNTER — Other Ambulatory Visit: Payer: Self-pay | Admitting: *Deleted

## 2014-06-08 DIAGNOSIS — I739 Peripheral vascular disease, unspecified: Secondary | ICD-10-CM

## 2014-06-08 DIAGNOSIS — I6523 Occlusion and stenosis of bilateral carotid arteries: Secondary | ICD-10-CM

## 2014-06-28 ENCOUNTER — Encounter: Payer: Self-pay | Admitting: Family

## 2014-06-29 ENCOUNTER — Ambulatory Visit (HOSPITAL_COMMUNITY)
Admission: RE | Admit: 2014-06-29 | Discharge: 2014-06-29 | Disposition: A | Discharge status: Home / Self Care | Payer: Medicare Other | Source: Ambulatory Visit | Attending: Family | Admitting: Family

## 2014-06-29 ENCOUNTER — Ambulatory Visit (INDEPENDENT_AMBULATORY_CARE_PROVIDER_SITE_OTHER): Payer: Medicare Other | Admitting: Family

## 2014-06-29 ENCOUNTER — Ambulatory Visit (INDEPENDENT_AMBULATORY_CARE_PROVIDER_SITE_OTHER)
Admission: RE | Admit: 2014-06-29 | Discharge: 2014-06-29 | Disposition: A | Discharge status: Home / Self Care | Payer: Medicare Other | Source: Ambulatory Visit | Attending: Family | Admitting: Family

## 2014-06-29 ENCOUNTER — Encounter: Payer: Self-pay | Admitting: Family

## 2014-06-29 VITALS — BP 142/62 | HR 54 | Resp 14 | Ht 68.0 in | Wt 212.0 lb

## 2014-06-29 DIAGNOSIS — I6523 Occlusion and stenosis of bilateral carotid arteries: Secondary | ICD-10-CM

## 2014-06-29 DIAGNOSIS — I739 Peripheral vascular disease, unspecified: Secondary | ICD-10-CM

## 2014-06-29 DIAGNOSIS — I872 Venous insufficiency (chronic) (peripheral): Secondary | ICD-10-CM

## 2014-06-29 DIAGNOSIS — I70219 Atherosclerosis of native arteries of extremities with intermittent claudication, unspecified extremity: Secondary | ICD-10-CM

## 2014-06-29 NOTE — Patient Instructions (Addendum)
Stroke Prevention Some medical conditions and behaviors are associated with an increased chance of having a stroke. You may prevent a stroke by making healthy choices and managing medical conditions. HOW CAN I REDUCE MY RISK OF HAVING A STROKE?   Stay physically active. Get at least 30 minutes of activity on most or all days.  Do not smoke. It may also be helpful to avoid exposure to secondhand smoke.  Limit alcohol use. Moderate alcohol use is considered to be:  No more than 2 drinks per day for men.  No more than 1 drink per day for nonpregnant women.  Eat healthy foods. This involves:  Eating 5 or more servings of fruits and vegetables a day.  Making dietary changes that address high blood pressure (hypertension), high cholesterol, diabetes, or obesity.  Manage your cholesterol levels.  Making food choices that are high in fiber and low in saturated fat, trans fat, and cholesterol may control cholesterol levels.  Take any prescribed medicines to control cholesterol as directed by your health care provider.  Manage your diabetes.  Controlling your carbohydrate and sugar intake is recommended to manage diabetes.  Take any prescribed medicines to control diabetes as directed by your health care provider.  Control your hypertension.  Making food choices that are low in salt (sodium), saturated fat, trans fat, and cholesterol is recommended to manage hypertension.  Take any prescribed medicines to control hypertension as directed by your health care provider.  Maintain a healthy weight.  Reducing calorie intake and making food choices that are low in sodium, saturated fat, trans fat, and cholesterol are recommended to manage weight.  Stop drug abuse.  Avoid taking birth control pills.  Talk to your health care provider about the risks of taking birth control pills if you are over 35 years old, smoke, get migraines, or have ever had a blood clot.  Get evaluated for sleep  disorders (sleep apnea).  Talk to your health care provider about getting a sleep evaluation if you snore a lot or have excessive sleepiness.  Take medicines only as directed by your health care provider.  For some people, aspirin or blood thinners (anticoagulants) are helpful in reducing the risk of forming abnormal blood clots that can lead to stroke. If you have the irregular heart rhythm of atrial fibrillation, you should be on a blood thinner unless there is a good reason you cannot take them.  Understand all your medicine instructions.  Make sure that other conditions (such as anemia or atherosclerosis) are addressed. SEEK IMMEDIATE MEDICAL CARE IF:   You have sudden weakness or numbness of the face, arm, or leg, especially on one side of the body.  Your face or eyelid droops to one side.  You have sudden confusion.  You have trouble speaking (aphasia) or understanding.  You have sudden trouble seeing in one or both eyes.  You have sudden trouble walking.  You have dizziness.  You have a loss of balance or coordination.  You have a sudden, severe headache with no known cause.  You have new chest pain or an irregular heartbeat. Any of these symptoms may represent a serious problem that is an emergency. Do not wait to see if the symptoms will go away. Get medical help at once. Call your local emergency services (911 in [place] Do not drive yourself to the hospital. Document Released: 07/17/2004 Document Revised: 10/24/2013 Document Reviewed: 12/10/2012 [employer] Patient Information 2015 [employer]. This information is not intended to replace advice given   to you by your health care provider. Make sure you discuss any questions you have with your health care provider.   Peripheral Vascular Disease Peripheral Vascular Disease (PVD), also called Peripheral Arterial Disease (PAD), is a circulation problem caused by cholesterol (atherosclerotic plaque) deposits in the arteries.  PVD commonly occurs in the lower extremities (legs) but it can occur in other areas of the body, such as your arms. The cholesterol buildup in the arteries reduces blood flow which can cause pain and other serious problems. The presence of PVD can place a person at risk for Coronary Artery Disease (CAD).  CAUSES  Causes of PVD can be many. It is usually associated with more than one risk factor such as:   High Cholesterol.  Smoking.  Diabetes.  Lack of exercise or inactivity.  High blood pressure (hypertension).  Obesity.  Family history. SYMPTOMS   When the lower extremities are affected, patients with PVD may experience:  Leg pain with exertion or physical activity. This is called INTERMITTENT CLAUDICATION. This may present as cramping or numbness with physical activity. The location of the pain is associated with the level of blockage. For example, blockage at the abdominal level (distal abdominal aorta) may result in buttock or hip pain. Lower leg arterial blockage may result in calf pain.  As PVD becomes more severe, pain can develop with less physical activity.  In people with severe PVD, leg pain may occur at rest.  Other PVD signs and symptoms:  Leg numbness or weakness.  Coldness in the affected leg or foot, especially when compared to the other leg.  A change in leg color.  Patients with significant PVD are more prone to ulcers or sores on toes, feet or legs. These may take longer to heal or may reoccur. The ulcers or sores can become infected.  If signs and symptoms of PVD are ignored, gangrene may occur. This can result in the loss of toes or loss of an entire limb.  Not all leg pain is related to PVD. Other medical conditions can cause leg pain such as:  Blood clots (embolism) or Deep Vein Thrombosis.  Inflammation of the blood vessels (vasculitis).  Spinal stenosis. DIAGNOSIS  Diagnosis of PVD can involve several different types of tests. These can  include:  Pulse Volume Recording Method (PVR). This test is simple, painless and does not involve the use of X-rays. PVR involves measuring and comparing the blood pressure in the arms and legs. An ABI (Ankle-Brachial Index) is calculated. The normal ratio of blood pressures is 1. As this number becomes smaller, it indicates more severe disease.  < 0.95 - indicates significant narrowing in one or more leg vessels.  <0.8 - there will usually be pain in the foot, leg or buttock with exercise.  <0.4 - will usually have pain in the legs at rest.  <0.25 - usually indicates limb threatening PVD.  Doppler detection of pulses in the legs. This test is painless and checks to see if you have a pulses in your legs/feet.  A dye or contrast material (a substance that highlights the blood vessels so they show up on x-ray) may be given to help your caregiver better see the arteries for the following tests. The dye is eliminated from your body by the kidney's. Your caregiver may order blood work to check your kidney function and other laboratory values before the following tests are performed:  Magnetic Resonance Angiography (MRA). An MRA is a picture study of the blood vessels   and arteries. The MRA machine uses a large magnet to produce images of the blood vessels.  Computed Tomography Angiography (CTA). A CTA is a specialized x-ray that looks at how the blood flows in your blood vessels. An IV may be inserted into your arm so contrast dye can be injected.  Angiogram. Is a procedure that uses x-rays to look at your blood vessels. This procedure is minimally invasive, meaning a small incision (cut) is made in your groin. A small tube (catheter) is then inserted into the artery of your groin. The catheter is guided to the blood vessel or artery your caregiver wants to examine. Contrast dye is injected into the catheter. X-rays are then taken of the blood vessel or artery. After the images are obtained, the  catheter is taken out. TREATMENT  Treatment of PVD involves many interventions which may include:  Lifestyle changes:  Quitting smoking.  Exercise.  Following a low fat, low cholesterol diet.  Control of diabetes.  Foot care is very important to the PVD patient. Good foot care can help prevent infection.  Medication:  Cholesterol-lowering medicine.  Blood pressure medicine.  Anti-platelet drugs.  Certain medicines may reduce symptoms of Intermittent Claudication.  Interventional/Surgical options:  Angioplasty. An Angioplasty is a procedure that inflates a balloon in the blocked artery. This opens the blocked artery to improve blood flow.  Stent Implant. A wire mesh tube (stent) is placed in the artery. The stent expands and stays in place, allowing the artery to remain open.  Peripheral Bypass Surgery. This is a surgical procedure that reroutes the blood around a blocked artery to help improve blood flow. This type of procedure may be performed if Angioplasty or stent implants are not an option. SEEK IMMEDIATE MEDICAL CARE IF:   You develop pain or numbness in your arms or legs.  Your arm or leg turns cold, becomes blue in color.  You develop redness, warmth, swelling and pain in your arms or legs. MAKE SURE YOU:   Understand these instructions.  Will watch your condition.  Will get help right away if you are not doing well or get worse. Document Released: 07/17/2004 Document Revised: 09/01/2011 Document Reviewed: 06/13/2008 [facility] Patient Information 2015 [place], [place]. This information is not intended to replace advice given to you by your health care provider. Make sure you discuss any questions you have with your health care provider.   Venous Stasis or Chronic Venous Insufficiency Chronic venous insufficiency, also called venous stasis, is a condition that affects the veins in the legs. The condition prevents blood from being pumped through these veins  effectively. Blood may no longer be pumped effectively from the legs back to the heart. This condition can range from mild to severe. With proper treatment, you should be able to continue with an active life. CAUSES  Chronic venous insufficiency occurs when the vein walls become stretched, weakened, or damaged or when valves within the vein are damaged. Some common causes of this include:  High blood pressure inside the veins (venous hypertension).  Increased blood pressure in the leg veins from long periods of sitting or standing.  A blood clot that blocks blood flow in a vein (deep vein thrombosis).  Inflammation of a superficial vein (phlebitis) that causes a blood clot to form. RISK FACTORS Various things can make you more likely to develop chronic venous insufficiency, including:  Family history of this condition.  Obesity.  Pregnancy.  Sedentary lifestyle.  Smoking.  Jobs requiring long periods of standing  or sitting in one place.  Being a certain age. Women in their 6s and 36s and men in their 30s are more likely to develop this condition. SIGNS AND SYMPTOMS  Symptoms may include:   Varicose veins.  Skin breakdown or ulcers.  Reddened or discolored skin on the leg.  Brown, smooth, tight, and painful skin just above the ankle, usually on the inside surface (lipodermatosclerosis).  Swelling. DIAGNOSIS  To diagnose this condition, your health care provider will take a medical history and do a physical exam. The following tests may be ordered to confirm the diagnosis:  Duplex ultrasound--A procedure that produces a picture of a blood vessel and nearby organs and also provides information on blood flow through the blood vessel.  Plethysmography--A procedure that tests blood flow.  A venogram, or venography--A procedure used to look at the veins using X-ray and dye. TREATMENT The goals of treatment are to help you return to an active life and to minimize pain or  disability. Treatment will depend on the severity of the condition. Medical procedures may be needed for severe cases. Treatment options may include:   Use of compression stockings. These can help with symptoms and lower the chances of the problem getting worse, but they do not cure the problem.  Sclerotherapy--A procedure involving an injection of a material that "dissolves" the damaged veins. Other veins in the network of blood vessels take over the function of the damaged veins.  Surgery to remove the vein or cut off blood flow through the vein (vein stripping or laser ablation surgery).  Surgery to repair a valve. HOME CARE INSTRUCTIONS   Wear compression stockings as directed by your health care provider.  Only take over-the-counter or prescription medicines for pain, discomfort, or fever as directed by your health care provider.  Follow up with your health care provider as directed. SEEK MEDICAL CARE IF:   You have redness, swelling, or increasing pain in the affected area.  You see a red streak or line that extends up or down from the affected area.  You have a breakdown or loss of skin in the affected area, even if the breakdown is small.  You have an injury to the affected area. SEEK IMMEDIATE MEDICAL CARE IF:   You have an injury and open wound in the affected area.  Your pain is severe and does not improve with medicine.  You have sudden numbness or weakness in the foot or ankle below the affected area, or you have trouble moving your foot or ankle.  You have a fever or persistent symptoms for more than 2-3 days.  You have a fever and your symptoms suddenly get worse. MAKE SURE YOU:   Understand these instructions.  Will watch your condition.  Will get help right away if you are not doing well or get worse. Document Released: 10/13/2006 Document Revised: 03/30/2013 Document Reviewed: 02/14/2013 [facility] Patient Information 2015 [place], [place]. This information  is not intended to replace advice given to you by your health care provider. Make sure you discuss any questions you have with your health care provider.

## 2014-06-29 NOTE — Progress Notes (Signed)
[facility] [place] HISTORY AND PHYSICAL   MRN : [id number]  History of Present Illness:   Lance Collier is a 69 y.o. male patient of Dr. Oneida Alar who presents for evaluation of asymptomatic carotid stenosis and bilateral lower extremity claudication. Pt follows up with me for the first time.  His carotid stenosis was originally detected on workup for coronary bypass grafting. He denies any symptoms of TIA amaurosis or stroke. He also has bilateral calf claudication. He states that he did get into cardiac rehabilitation in his walking distance has improved some. He is currently walking 30 minutes daily in the morning to try to improve his walking distance. Other medical problems include hypertension, diabetes, coronary disease, elevated cholesterol, migraine. These are all currently stable. He sees a Conservation officer, historic buildings. He has claudication in both calves after walking about 12 minutes on his treadmill; he walks 30-45 minutes, 5-7 days/week; he denies claudication symptoms in his buttocks. He denies rest pain, denies non healing wounds. He denies chest pain or dyspnea. He denies pain, weakness, tingling or numbness in either upper extremity.   Pt Diabetic: Yes, he states his last A1C was 8.2, states increased from 7.2; sometimes he forgets to take his rapid acting insulin around meals Pt smoker: former smoker, quit in 1998  Pt meds include: Statin :Yes, pt states his cholesterol is high Betablocker: No ASA: Yes Other anticoagulants/antiplatelets: no  Current Outpatient Prescriptions  Medication Sig Dispense Refill  . acetaminophen (TYLENOL) 325 MG tablet Take 650 mg by mouth as needed.    Marland Kitchen amLODipine (NORVASC) 10 MG tablet Take 1 tablet (10 mg total) by mouth daily. 30 tablet 9  . aspirin EC 325 MG EC tablet Take 1 tablet (325 mg total) by mouth daily. 30 tablet 0  . B-D ULTRAFINE III SHORT PEN 31G X 8 MM MISC See admin instructions.  0  . fenofibrate 160 MG tablet  Take 160 mg by mouth daily.    . furosemide (LASIX) 20 MG tablet Take 1 tablet (20 mg total) by mouth daily. 30 tablet 9  . HUMALOG KWIKPEN 100 UNIT/ML SOPN Inject 10-15 Units into the skin 3 (three) times daily. Sliding scale. 10 units with breakfast and 15 units with lunch and supper    . insulin glargine (LANTUS) 100 UNIT/ML injection Inject 40-65 Units into the skin 2 (two) times daily. 40 units in am, 60 units in pm    . metoprolol tartrate (LOPRESSOR) 25 MG tablet Take 1 tablet (25 mg total) by mouth 2 (two) times daily. 60 tablet 3  . nitroGLYCERIN (NITROSTAT) 0.4 MG SL tablet Place 1 tablet (0.4 mg total) under the tongue every 5 (five) minutes as needed for chest pain. 90 tablet 3  . pantoprazole (PROTONIX) 40 MG tablet Take 40 mg by mouth daily.    . rosuvastatin (CRESTOR) 10 MG tablet Take 1 tablet (10 mg total) by mouth every other day. 15 tablet 11   No current facility-administered medications for this visit.    Past Medical History  Diagnosis Date  . DM (diabetes mellitus) type II uncontrolled with eye manifestation 2013    Diabetic retinopathy with retinal hemorrhages since;, recurrent in August 2014 treated with Avastin shots  . Hypertension   . CAD S/P percutaneous coronary angioplasty 06/12/2011    status post complex PCI to the RCA requiring the use of GuideLiner catheter.  2  . Presence of drug coated stent in right coronary artery 06/12/2011    Subtotal proximal RCA (tortuous) -->  complex PCI requiring Guideliner: 2 overlapping Promus element DES stents.  . 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  Likely thromboembolic event with PDA occlusion. 06/12/2011    Very difficult, complex procedure requiring multiple guidewires, guide-liner, etc.  Angiographic evidence of distal thrombo / anthero-embolism as likely etiology of Troponin of ~1.58. Pt remains asymptomatic.   . 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 Aortic sclerosis  - without Stenosis  March 2014    Echo: Aortic sclerosis without stenosis. EF 55-60% with normal WM; mild concentric hypertrophy with normal relaxation.  Marland Kitchen PAD (peripheral artery disease)     with claudication  . High cholesterol   . Migraine ~ 2004    "once"  . Anemia   . 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 GERD (gastroesophageal reflux disease)   . Anxiety     Social History History  Substance Use Topics  . Smoking status: Former Smoker -- 2.00 packs/day for 27 years    Types: Cigarettes    Quit date: 11/23/1996  . Smokeless tobacco: Never Used  . Alcohol Use: 0.0 oz/week     Comment: 06/28/2013 "haven't had any alcohol in several weeks"    Family History Family History  Problem Relation Age of Onset  . Cancer Sister     Lung cancer  . Cancer Mother   . Cancer Father   . Hyperlipidemia Father   . Hypertension Father   . Cancer Maternal Grandmother   . Cancer Paternal Grandfather     Surgical History Past Surgical History  Procedure Laterality Date  . Tonsillectomy    . Nm myoview ltd  January '13    Diaphragmatic attenuation versus inferior infarct. No ischemia. Normal EF normal wall motion.  Marland Kitchen Nm myoview ltd  June 2014    no ischemia  . Coronary angioplasty with stent placement Right September 2012    2 overlapping Promus DES 2.7 mm at 12 mm x2; postdilated to 3 mm  . Cardiac catheterization  06/28/2013    ostial LM disese, RCA ~40-50%ISR  . Retinal laser procedure Bilateral     "more than once"   . Coronary artery bypass graft N/A 07/01/2013    Procedure: CORONARY ARTERY BYPASS GRAFTING (CABG);  Surgeon: Grace Isaac, MD;  Location: [place];  Service: Open Heart Surgery;  Laterality: N/A;  Times 3 using left internal mammary artery and endoscopically harvested bilateral saphenous vein  . Intraoperative transesophageal echocardiogram N/A 07/01/2013    Procedure: INTRAOPERATIVE  TRANSESOPHAGEAL ECHOCARDIOGRAM;  Surgeon: Grace Isaac, MD;  Location: [place];  Service: Open Heart Surgery;  Laterality: N/A;  . Transthoracic echocardiogram  06/29/2013    Focal basal hypertrophy with normal LV size. EF 60-65% no regional WMA. Mild LA dilation  . Left heart catheterization with coronary angiogram N/A 06/11/2011    Procedure: LEFT HEART CATHETERIZATION WITH CORONARY ANGIOGRAM;  Surgeon: Fulton Reek, MD;  Location: [facility] CATH LAB;  Service: Cardiovascular;  Laterality: N/A;  . Percutaneous coronary stent intervention (pci-s)  06/11/2011    Procedure: PERCUTANEOUS CORONARY STENT INTERVENTION (PCI-S);  Surgeon: Fulton Reek, MD;  Location: [facility] CATH LAB;  Service: Cardiovascular;;  . Left heart catheterization with coronary angiogram N/A 06/28/2013    Procedure: LEFT HEART CATHETERIZATION WITH CORONARY ANGIOGRAM;  Surgeon: Leonie Man, MD;  Location: [facility] CATH LAB;  Service: Cardiovascular;  Laterality: N/A;  Allergies  Allergen Reactions  . Atorvastatin Other (See Comments)    Leg pain  . Ibuprofen Hives  . Pravastatin   . Simvastatin Other (See Comments)    Leg pain    Current Outpatient Prescriptions  Medication Sig Dispense Refill  . acetaminophen (TYLENOL) 325 MG tablet Take 650 mg by mouth as needed.    Marland Kitchen amLODipine (NORVASC) 10 MG tablet Take 1 tablet (10 mg total) by mouth daily. 30 tablet 9  . aspirin EC 325 MG EC tablet Take 1 tablet (325 mg total) by mouth daily. 30 tablet 0  . B-D ULTRAFINE III SHORT PEN 31G X 8 MM MISC See admin instructions.  0  . fenofibrate 160 MG tablet Take 160 mg by mouth daily.    . furosemide (LASIX) 20 MG tablet Take 1 tablet (20 mg total) by mouth daily. 30 tablet 9  . HUMALOG KWIKPEN 100 UNIT/ML SOPN Inject 10-15 Units into the skin 3 (three) times daily. Sliding scale. 10 units with breakfast and 15 units with lunch and supper    . insulin glargine (LANTUS) 100 UNIT/ML injection Inject 40-65 Units into the skin 2 (two)  times daily. 40 units in am, 60 units in pm    . metoprolol tartrate (LOPRESSOR) 25 MG tablet Take 1 tablet (25 mg total) by mouth 2 (two) times daily. 60 tablet 3  . nitroGLYCERIN (NITROSTAT) 0.4 MG SL tablet Place 1 tablet (0.4 mg total) under the tongue every 5 (five) minutes as needed for chest pain. 90 tablet 3  . pantoprazole (PROTONIX) 40 MG tablet Take 40 mg by mouth daily.    . rosuvastatin (CRESTOR) 10 MG tablet Take 1 tablet (10 mg total) by mouth every other day. 15 tablet 11   No current facility-administered medications for this visit.     REVIEW OF SYSTEMS: See HPI for pertinent positives and negatives.  Physical Examination Filed Vitals:   06/29/14 1035 06/29/14 1038  BP: 154/70 142/62  Pulse: 53 54  Resp:  14  Height:  5\' 8"  (1.727 m)  Weight:  212 lb (96.163 kg)  SpO2:  95%   Body mass index is 32.24 kg/(m^2).  General:  WDWN in NAD, obese male Gait: Normal HENT: WNL Eyes: Pupils equal Pulmonary: normal non-labored breathing , without Rales, rhonchi,  wheezing Cardiac: RRR, no murmur detected  Abdomen: soft, NT, no masses palpated Skin: no rashes, no ulcers;  no Gangrene , no cellulitis; no open wounds.   VASCULAR EXAM  Carotid Bruits Right Left   Negative Positive   radial pulses: 2+ right, 1+ left palpable Aorta is not palpable                           VASCULAR EXAM: Extremities without ischemic changes  without Gangrene; without open wounds; without drainage.                                                                                                          LE Pulses Right Left  FEMORAL  faintly palpable  not palpable        POPLITEAL  not palpable   not palpable       POSTERIOR TIBIAL  not palpable   not palpable        DORSALIS PEDIS      ANTERIOR TIBIAL faintly palpable  1+ palpable      Musculoskeletal: no muscle wasting or atrophy; pitting edema in both lower legs: trace in right, 1+ in left.  Neurologic: A&O X 3;  Appropriate Affect ;  SENSATION: normal; MOTOR FUNCTION: 5/5 Symmetric, CN 2-12 intact Speech is fluent/normal   Non-Invasive Vascular Imaging (06/29/2014):  CEREBROVASCULAR DUPLEX EVALUATION    INDICATION: Carotid artery stenosis    PREVIOUS INTERVENTION(S): NA    DUPLEX EXAM:     RIGHT  LEFT  Peak Systolic Velocities (cm/s) End Diastolic Velocities (cm/s) Plaque LOCATION Peak Systolic Velocities (cm/s) End Diastolic Velocities (cm/s) Plaque  189 11  CCA PROXIMAL 161 13 HT  128 14 HT CCA MID 157 18   125 18 HT CCA DISTAL 112 17 CP  208 13 HT ECA 122 8 CP  120 17 HT ICA PROXIMAL 248 44 CP  106 18  ICA MID 270 52 CP  74 14  ICA DISTAL 64 10     0.94 ICA / CCA Ratio (PSV) 1.58  Antegrade Vertebral Flow Antegrade  123XX123 Brachial Systolic Pressure (mmHg) A999333  Triphasic Brachial Artery Waveforms Triphasic    Plaque Morphology:  HM = Homogeneous, HT = Heterogeneous, CP = Calcific Plaque, SP = Smooth Plaque, IP = Irregular Plaque     ADDITIONAL FINDINGS:     IMPRESSION: Right internal carotid artery stenosis present in the less than 40% range. Left internal carotid artery stenosis present in the 40%-59% range, however calcific plaque is present making Doppler interrogation difficult and may be underestimating level of disease.    Compared to the previous exam:  Stable on the right and unable to compare to previous on the left due to calcific plaque present making Doppler interrogation difficult, since study on 12/22/2013.     ASSESSMENT:  Mendell W Collier is a 69 y.o. male with asymptomatic carotid stenosis and bilateral lower extremity claudication. Today's carotid Duplex reveals right internal carotid artery stenosis present in the less than 40% range. Left internal carotid artery stenosis present in the 40%-59% range, however calcific plaque is present making Doppler interrogation difficult and may be underestimating level of disease. Stable on the right and unable to  compare to previous on the left due to calcific plaque present making Doppler interrogation difficult, since study on 12/22/2013.  ABI's today indicate mild arterial occlusive disease in the right LE and moderate in the left; stable in the right and worse in the left LE, biphasic waveforms in the left LE and tria dn biphasic in the right LE.Marland Kitchen He has claudication in both calves after walking about 12 minutes on his treadmill; he walks 30-45 minutes, 5-7 days/week; he denies claudication symptoms in his buttocks. He denies rest pain, has no tissue loss.  New problem: Chronic venous insufficiency: see Plan. Over 30 minutes was spent face to face with pt ; over 50% of that time was spent on counseling and coordination of care  PLAN:   Continue graduated walking program and maximal medical management of atherosclerotic risk factors.  CVI: elevation of legs above heart when not walking, knee high graduated compression hose during the day, measure to fit.  Based on today's exam and non-invasive  vascular lab results, the patient will follow up in 6 months with the following tests carotid Duplex and ABI's. I discussed in depth with the patient the nature of atherosclerosis, and emphasized the importance of maximal medical management including strict control of blood pressure, blood glucose, and lipid levels, obtaining regular exercise, and cessation of smoking.  The patient is aware that without maximal medical management the underlying atherosclerotic disease process will progress, limiting the benefit of any interventions.  The patient was given information about stroke prevention and what symptoms should prompt the patient to seek immediate medical care.  The patient was given information about PAD including signs, symptoms, treatment, what symptoms should prompt the patient to seek immediate medical care, and risk reduction measures to take. Thank you for allowing [place] to participate in this patient's  care.  Clemon Chambers, RN, MSN, FNP-C [facility] Office: [phone]  Clinic MD: Early 06/29/2014 11:05 AM

## 2014-07-05 ENCOUNTER — Telehealth: Payer: Self-pay | Admitting: Vascular Surgery

## 2014-07-05 NOTE — Telephone Encounter (Addendum)
-----   Message from Mena Goes, RN sent at 07/03/2014  5:11 PM EST ----- Regarding: Schedule Is there any way to mark this?     ----- Message -----    From: Elam Dutch, MD    Sent: 07/03/2014   3:51 PM      To: Mena Goes, RN  Can you make sure Lance Collier's future appts are with me only.  Charles    07/05/2014: patients EPIC noted appropriately, dpm.

## 2014-07-13 ENCOUNTER — Telehealth: Payer: Self-pay | Admitting: Cardiology

## 2014-07-13 NOTE — Telephone Encounter (Signed)
Close encounter 

## 2014-07-14 ENCOUNTER — Ambulatory Visit: Payer: Medicare Other | Admitting: Cardiology

## 2014-07-18 ENCOUNTER — Ambulatory Visit (INDEPENDENT_AMBULATORY_CARE_PROVIDER_SITE_OTHER): Payer: Medicare Other | Admitting: Physician Assistant

## 2014-07-18 ENCOUNTER — Encounter: Payer: Self-pay | Admitting: Physician Assistant

## 2014-07-18 VITALS — BP 164/60 | HR 54 | Ht 68.0 in | Wt 211.1 lb

## 2014-07-18 DIAGNOSIS — I6523 Occlusion and stenosis of bilateral carotid arteries: Secondary | ICD-10-CM

## 2014-07-18 DIAGNOSIS — E669 Obesity, unspecified: Secondary | ICD-10-CM | POA: Diagnosis not present

## 2014-07-18 DIAGNOSIS — I2510 Atherosclerotic heart disease of native coronary artery without angina pectoris: Secondary | ICD-10-CM | POA: Diagnosis not present

## 2014-07-18 DIAGNOSIS — E785 Hyperlipidemia, unspecified: Secondary | ICD-10-CM

## 2014-07-18 DIAGNOSIS — I739 Peripheral vascular disease, unspecified: Secondary | ICD-10-CM | POA: Diagnosis not present

## 2014-07-18 DIAGNOSIS — I10 Essential (primary) hypertension: Secondary | ICD-10-CM | POA: Diagnosis not present

## 2014-07-18 DIAGNOSIS — Z951 Presence of aortocoronary bypass graft: Secondary | ICD-10-CM

## 2014-07-18 DIAGNOSIS — I2583 Coronary atherosclerosis due to lipid rich plaque: Secondary | ICD-10-CM

## 2014-07-18 MED ORDER — HYDRALAZINE HCL 50 MG PO TABS: 50.0000 mg | ORAL_TABLET | Freq: Two times a day (BID) | ORAL | Status: DC

## 2014-07-18 NOTE — Patient Instructions (Addendum)
Your physician recommends that you schedule a follow-up appointment in: 3-4 Weeks 30 Mins with Dr Ellyn Hack  Your physician has recommended you make the following change in your medication: Start Hydralazine 50 mg twice daily

## 2014-07-18 NOTE — Assessment & Plan Note (Addendum)
Last lipid panel : Total cholesterol 235, triglycerides 568, HDL 24, LDL 97.   Dr. Forde Dandy checks this.  Continue Crestor and fenofibrate.  Patient reports eating this amazingly healthy diet with lots of fish and vegetables. However, he's lost no weight in the last year and his triglycerides are extremely elevated. I suspect these eating more carbohydrate than he lets on.

## 2014-07-18 NOTE — Assessment & Plan Note (Addendum)
Continues to complain of claudication which is worsened in the last 3 years. Extremity arterial Dopplers on 06/29/2014 showed worsening ABIs on the left. This is followed by Dr. Oneida Alar.

## 2014-07-18 NOTE — Assessment & Plan Note (Signed)
Patient reports eating a diet mostly vegetables and fish. He also says he exercises by walking daily 0.5 miles and weights 2 times a week. He is clearly putting in more calories than he is exerting however, needs to cut back on his portion sizes which he seems to be aware of.  His weight has increased slightly since his last office visit.

## 2014-07-18 NOTE — Assessment & Plan Note (Addendum)
Poorly controlled. Will add hydralazine 50 mg twice daily.  Avoiding Ace or Arb with chronic kidney disease and heart rate is in the 50s already so I will not increase his beta blocker.

## 2014-07-18 NOTE — Progress Notes (Addendum)
Patient ID: Lance Collier, male   DOB: 1946-02-12, 69 y.o.   MRN: [id number]    Date:  07/18/2014   ID:  Lance Collier, DOB 04-01-1946, MRN [id number]  PCP:  Sheela Stack, MD  Primary Cardiologist:  Ellyn Hack  Chief Complaint  Patient presents with  . 6 mo rov    patient reports no symptoms.     History of Present Illness: Lance Collier is a 69 y.o. male the past medical history below   Dec 2012 - Complex RCA PCI (Dr. 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 Servando Snare)     Echo 06/29/2013: EF 60-65%, Aortic Sclerosis. No regional WMA  Graduated from [facility] [employer] [facility] in May 2015  She presents for six-month evaluation. He was originally to see Dr. Ellyn Hack on Friday however the weather close the office. He prefers to see the cardiologist as opposed to extender. This denies any angina. He exercises by walking 1.5 miles every day. He doesn't get his heart rate any higher than 90. Says he exercises by doing weights with a trainer 2 times per week. Reports eating healthy meals with lots of vegetables and fish. He follows a low-sodium diet and is looking at the nutritional information on foods he buys. His weight has gone up 3 pounds since July of last year.  He says he can't walk too fast because his legs tend to hurt quite a bit due to his peripheral vascular disease. This is worsened in the last 3 years. Dr. Oneida Alar is following him for this. He currently denies nausea, vomiting, fever, chest pain, shortness of breath, orthopnea, dizziness, PND, cough, congestion, abdominal pain, hematochezia, melena, lower extremity edema.  Wt Readings from Last 3 Encounters:  07/18/14 211 lb 1.6 oz (95.754 kg)  06/29/14 212 lb (96.163 kg)  12/22/13 209 lb (94.802 kg)     Past Medical History  Diagnosis Date  . DM (diabetes mellitus) type II uncontrolled with eye manifestation  2013    Diabetic retinopathy with retinal hemorrhages since;, recurrent in August 2014 treated with Avastin shots  . Hypertension   . CAD S/P percutaneous coronary angioplasty 06/12/2011    status post complex PCI to the RCA requiring the use of GuideLiner catheter.  2  . Presence of drug coated stent in right coronary artery 06/12/2011    Subtotal proximal RCA (tortuous) --> complex PCI requiring Guideliner: 2 overlapping Promus element DES stents.  . 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  Likely thromboembolic event with PDA occlusion. 06/12/2011    Very difficult, complex procedure requiring multiple guidewires, guide-liner, etc.  Angiographic evidence of distal thrombo / anthero-embolism as likely etiology of Troponin of ~1.58. Pt remains asymptomatic.   . 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 Aortic sclerosis  - without Stenosis  March 2014    Echo: Aortic sclerosis without stenosis. EF 55-60% with normal WM; mild concentric hypertrophy with normal relaxation.  Marland Kitchen PAD (peripheral artery disease)     with claudication  . High cholesterol   . Migraine ~ 2004    "once"  . Anemia   . 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 GERD (gastroesophageal reflux disease)   . Anxiety     Current Outpatient Prescriptions  Medication Sig Dispense Refill  . acetaminophen (TYLENOL) 325 MG tablet Take 650 mg by mouth as needed.    Marland Kitchen amLODipine (NORVASC) 10 MG tablet Take 1 tablet (10 mg total) by mouth daily. 30 tablet 9  . aspirin EC 325 MG EC tablet Take 1 tablet (325 mg total) by mouth daily. 30 tablet 0  . B-D ULTRAFINE III SHORT PEN 31G X 8 MM MISC See admin instructions.  0  . fenofibrate 160 MG tablet Take 160 mg by mouth daily.    . furosemide (LASIX) 20 MG tablet Take 1 tablet (20 mg total) by mouth daily. 30 tablet 9  . HUMALOG KWIKPEN 100 UNIT/ML SOPN Inject 10-15 Units  into the skin 3 (three) times daily. Sliding scale. 10 units with breakfast and 15 units with lunch and supper    . hydrALAZINE (APRESOLINE) 50 MG tablet Take 1 tablet (50 mg total) by mouth 2 (two) times daily. 60 tablet 6  . insulin glargine (LANTUS) 100 UNIT/ML injection Inject 40-65 Units into the skin 2 (two) times daily. 40 units in am, 65 units in pm    . metoprolol tartrate (LOPRESSOR) 25 MG tablet Take 1 tablet (25 mg total) by mouth 2 (two) times daily. 60 tablet 3  . nitroGLYCERIN (NITROSTAT) 0.4 MG SL tablet Place 1 tablet (0.4 mg total) under the tongue every 5 (five) minutes as needed for chest pain. 90 tablet 3  . pantoprazole (PROTONIX) 40 MG tablet Take 40 mg by mouth daily.    . rosuvastatin (CRESTOR) 10 MG tablet Take 1 tablet (10 mg total) by mouth every other day. 15 tablet 11   No current facility-administered medications for this visit.    Allergies:    Allergies  Allergen Reactions  . Atorvastatin Other (See Comments)    Leg pain  . Ibuprofen Hives  . Pravastatin   . Simvastatin Other (See Comments)    Leg pain    Social History:  The patient  reports that he quit smoking about 17 years ago. His smoking use included Cigarettes. He has a 54 pack-year smoking history. He has never used smokeless tobacco. He reports that he drinks alcohol. He reports that he does not use illicit drugs.   Family history:   Family History  Problem Relation Age of Onset  . Cancer Sister     Lung cancer  . Cancer Mother   . Cancer Father   . Hyperlipidemia Father   . Hypertension Father   . Cancer Maternal Grandmother   . Cancer Paternal Grandfather     ROS:  Please see the history of present illness.  All other systems reviewed and negative.   PHYSICAL EXAM: VS:  BP 164/60 mmHg  Pulse 54  Ht 5\' 8"  (1.727 m)  Wt 211 lb 1.6 oz (95.754 kg)  BMI 32.10 kg/m2 Obese, well developed, in no acute distress HEENT: Pupils are equal round react to light accommodation extraocular  movements are intact.  Neck: no JVDNo cervical lymphadenopathy. Cardiac: Regular rate and rhythm 1 out of 6 murmurs rubs or gallops. Lungs:  clear to auscultation bilaterally, no wheezing, rhonchi or rales Abd: soft, nontender, positive bowel sounds all quadrants Ext: no lower extremity edema.  2+ radial pulses. Skin: warm and dry Neuro:  Grossly normal  EKG:  Sinus bradycardia rate 54 bpm   ASSESSMENT AND PLAN:  Problem List Items Addressed This Visit    S/P CABG (coronary artery bypass graft)  x 3, 07/01/13, LIMA-LAD; VG-RCA; VG- LCX (Chronic)  PAD (peripheral artery disease) both Rt and Lt disease with Lt ICA of 60-79% (Chronic)    Continues to complain of claudication which is worsened in the last 3 years. Extremity arterial Dopplers on 06/29/2014 showed worsening ABIs on the left. This is followed by Dr. Oneida Alar.      Relevant Medications   hydrALAZINE (APRESOLINE) tablet   Obesity (BMI 30-39.9) (Chronic)    Patient reports eating a diet mostly vegetables and fish. He also says he exercises by walking daily 0.5 miles and weights 2 times a week. He is clearly putting in more calories than he is exerting however, needs to cut back on his portion sizes which he seems to be aware of.  His weight has increased slightly since his last office visit.      HTN (hypertension) - Primary (Chronic)    Poorly controlled. Will add hydralazine 50 mg twice daily.  Avoiding Ace or Arb with chronic kidney disease and heart rate is in the 50s already so I will not increase his beta blocker.      Relevant Medications   hydrALAZINE (APRESOLINE) tablet   Dyslipidemia, Zocor intol (Chronic)    Last lipid panel : Total cholesterol 235, triglycerides 568, HDL 24, LDL 97.   Dr. Forde Dandy checks this.  Continue Crestor and fenofibrate.  Patient reports eating this amazingly healthy diet with lots of fish and vegetables. However, he's lost no weight in the last year and his triglycerides are extremely elevated. I  suspect these eating more carbohydrate than he lets on.      CAD (coronary artery disease), complex PCI/DES to RCA  (Chronic)    No complaints of angina. On aspirin, statin and beta blocker      Relevant Medications   hydrALAZINE (APRESOLINE) tablet      Follow-up with Dr. Ellyn Hack next available but no sooner than 2-3 weeks

## 2014-07-18 NOTE — Assessment & Plan Note (Signed)
No complaints of angina. On aspirin, statin and beta blocker

## 2014-07-21 NOTE — Addendum Note (Signed)
Addended byLauralee Evener. on: 07/21/2014 08:40 AM   Modules accepted: Orders

## 2014-07-21 NOTE — Addendum Note (Signed)
Addended by: Chauncy Lean. on: 07/21/2014 08:33 AM   Modules accepted: Orders

## 2014-07-26 ENCOUNTER — Encounter: Payer: Self-pay | Admitting: Cardiology

## 2014-07-26 ENCOUNTER — Other Ambulatory Visit: Payer: Self-pay | Admitting: Cardiology

## 2014-07-26 NOTE — Telephone Encounter (Signed)
Rx has been sent to the pharmacy electronically. ° °

## 2014-07-28 ENCOUNTER — Ambulatory Visit: Payer: Medicare Other | Admitting: Cardiology

## 2014-07-31 ENCOUNTER — Ambulatory Visit (INDEPENDENT_AMBULATORY_CARE_PROVIDER_SITE_OTHER): Payer: Medicare Other | Admitting: Cardiology

## 2014-07-31 VITALS — BP 138/60 | HR 60 | Ht 68.0 in | Wt 213.7 lb

## 2014-07-31 DIAGNOSIS — I739 Peripheral vascular disease, unspecified: Secondary | ICD-10-CM | POA: Diagnosis not present

## 2014-07-31 DIAGNOSIS — Z951 Presence of aortocoronary bypass graft: Secondary | ICD-10-CM | POA: Diagnosis not present

## 2014-07-31 DIAGNOSIS — E785 Hyperlipidemia, unspecified: Secondary | ICD-10-CM | POA: Diagnosis not present

## 2014-07-31 DIAGNOSIS — I10 Essential (primary) hypertension: Secondary | ICD-10-CM | POA: Diagnosis not present

## 2014-07-31 DIAGNOSIS — Z79899 Other long term (current) drug therapy: Secondary | ICD-10-CM

## 2014-07-31 DIAGNOSIS — N182 Chronic kidney disease, stage 2 (mild): Secondary | ICD-10-CM

## 2014-07-31 DIAGNOSIS — I6523 Occlusion and stenosis of bilateral carotid arteries: Secondary | ICD-10-CM

## 2014-07-31 DIAGNOSIS — I2510 Atherosclerotic heart disease of native coronary artery without angina pectoris: Secondary | ICD-10-CM

## 2014-07-31 MED ORDER — CARVEDILOL 12.5 MG PO TABS: 12.5000 mg | ORAL_TABLET | Freq: Two times a day (BID) | ORAL | Status: DC

## 2014-07-31 NOTE — Patient Instructions (Signed)
STOP METOPROLOL  START CARVEDIOLOL 12.5MG   TWICE A DAY  FOR THE FIRST WEEK 6.25 MG (1/2 TABLET OF 12.5 MG) TWICE A DAY   IF BLOOD PRESSURE STAYS ABOVE 130/  INCREASE TO 12.5 MG TWICE A DAY  Your physician recommends that you schedule a follow-up appointment in [place]. AND LABS CMP, LIPID   Your physician wants you to follow-up in 6 MONTHS Dr Ellyn Hack. 30 MIN APPT. You will receive a reminder letter in the mail two months in advance. If you don't receive a letter, please call our office to schedule the follow-up appointment.

## 2014-08-01 ENCOUNTER — Encounter: Payer: Self-pay | Admitting: Cardiology

## 2014-08-01 DIAGNOSIS — Z79899 Other long term (current) drug therapy: Secondary | ICD-10-CM | POA: Insufficient documentation

## 2014-08-01 NOTE — Assessment & Plan Note (Signed)
Has been stable postoperatively. No Recent Check.Will monitor on CMP panel -- if stable, may very well be able to use ARB

## 2014-08-01 NOTE — Assessment & Plan Note (Signed)
He is actually tolerating Crestor 10 mg every other day very well. He is due for lipid panel and CMP -- ordered today

## 2014-08-01 NOTE — Assessment & Plan Note (Signed)
Checking lipid panel and LFTs for statin use.

## 2014-08-01 NOTE — Assessment & Plan Note (Signed)
He is now one year status post CABG. With him being limited as far as ambulation with claudication, I would strongly consider a Myoview stress test after this year. We may not know of potential symptoms because of his lack of ability to ambulate.

## 2014-08-01 NOTE — Progress Notes (Signed)
PATIENT: Lance Collier MRN: [id number]  DOB: [birth year]   DOV:08/01/2014 PCP: Lance Stack, MD  Clinic Note: Chief Complaint  Patient presents with  . Follow-up    3 weeks follow-up, pt denied chest pain  . Hypertension  . Coronary Artery Disease    cabg   HPI: Lance Collier is a 69 y.o.  male with a PMH below who presents today for a followup visit. Cardiac Histroy:  Dec 2012 - Complex RCA PCI (Dr. Rex Collier) 05/2011 - Type IVa MI  Crescendo Angina (Jan 2015) - (referred by Opthamologist; Moderate ISR + LM CAD --> CVTS  CABG x 3 (07/01/2013): LIMA-LAD, SVG-OM, SVG-RCA (Lance Collier)  Echo 06/29/2013: EF 60-65%, Aortic Sclerosis. No regional WMA  Graduated from [employer] in 10/2013  Seen by Lance Fuller, PA on 1/26/'16 - rescheduled visit with me 2/2 [place]. --> No angina, Walking limited by claudication (sees Dr. Oneida Collier - worsening ABIs --> med Rx & walking) -- as a result, wgt gain of ~3 lb despite trying to eat better.  Noted to be Hypertensive - Hydralazine 50 gm BID added (with intent to avoid ACE-I/ARB 2/2 ~CKD-3)  Interval History: he continues to note that he really does not have any anginal symptoms. He tries to walk about a mile to mile and half a day but has to stop every so often due to his claudication. If he tries to push to the claudication he may note a little bit of exertional dyspnea, but no angina. No resting or exertional dyspnea or angina with routine activity.he states that he recently saw Dr. Oneida Collier, and the recommendation remains continued walking regimen and medical management. It appeared that most of his PAD is below the knee, making it less susceptible to intervention. No PND, orthopnea or edema. He denies any headaches or blurred vision, palpitations or irregular heartbeats. No syncope/near syncope or TIA symptoms or associated symptoms. No myalgias or arthralgias from Crestor 10 mg every other day..   Past Medical History  Diagnosis Date  . DM  (diabetes mellitus) type II uncontrolled with eye manifestation 2013    Diabetic retinopathy with retinal hemorrhages since;, recurrent in August 2014 treated with Avastin shots  . Hypertension   . CAD S/P percutaneous coronary angioplasty 06/12/2011    status post complex PCI to the RCA requiring the use of GuideLiner catheter.  2  . Presence of drug coated stent in right coronary artery 06/12/2011    Subtotal proximal RCA (tortuous) --> complex PCI requiring Guideliner: 2 overlapping Promus element DES stents.  . 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  Likely thromboembolic event with PDA occlusion. 06/12/2011    Very difficult, complex procedure requiring multiple guidewires, guide-liner, etc.  Angiographic evidence of distal thrombo / anthero-embolism as likely etiology of Troponin of ~1.58. Pt remains asymptomatic.   . S/P CABG x 3 07/01/2013    (Cath for Crescendo Unstable Angina) Lance Collier: For LM disease; LIMA-LAD, SVG-RCA, SCG-Cx  . Aortic sclerosis  - without Stenosis  March 2014    Echo: Aortic sclerosis without stenosis. EF 55-60% with normal WM; mild concentric hypertrophy with normal relaxation.  Marland Kitchen PAD (peripheral artery disease)     with claudication  . High cholesterol   . Migraine ~ 2004    "once"  . Anemia   . 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 GERD (gastroesophageal reflux disease)   . Anxiety    Prior Cardiac Evaluation and Past  Surgical History: reviewed in Epic  Allergies  Allergen Reactions  . Atorvastatin Other (See Comments)    Leg pain  . Ibuprofen Hives  . Pravastatin   . Simvastatin Other (See Comments)    Leg pain   is tolerating every other day Crestor 10 mg  Current Outpatient Prescriptions  Medication Sig Dispense Refill  . acetaminophen (TYLENOL) 325 MG tablet Take 650 mg by mouth as needed.    Marland Kitchen amLODipine (NORVASC) 10 MG tablet TAKE 1 TABLET ONCE DAILY. 30 tablet 9  . aspirin EC 325 MG EC tablet Take  1 tablet (325 mg total) by mouth daily. 30 tablet 0  . B-D ULTRAFINE III SHORT PEN 31G X 8 MM MISC See admin instructions.  0  . fenofibrate 160 MG tablet Take 160 mg by mouth daily.    . furosemide (LASIX) 20 MG tablet TAKE 1 TABLET ONCE DAILY. 30 tablet 9  . HUMALOG KWIKPEN 100 UNIT/ML SOPN Inject 10-15 Units into the skin 3 (three) times daily. Sliding scale. 10 units with breakfast and 15 units with lunch and supper    . hydrALAZINE (APRESOLINE) 50 MG tablet Take 1 tablet (50 mg total) by mouth 2 (two) times daily. 60 tablet 6  . insulin glargine (LANTUS) 100 UNIT/ML injection Inject 40-65 Units into the skin 2 (two) times daily. 40 units in am, 65 units in pm    . nitroGLYCERIN (NITROSTAT) 0.4 MG SL tablet Place 1 tablet (0.4 mg total) under the tongue every 5 (five) minutes as needed for chest pain. 90 tablet 3  . pantoprazole (PROTONIX) 40 MG tablet Take 40 mg by mouth daily.    . rosuvastatin (CRESTOR) 10 MG tablet Take 1 tablet (10 mg total) by mouth every other day. 15 tablet 11  . carvedilol (COREG) 12.5 MG tablet Take 1 tablet (12.5 mg total) by mouth 2 (two) times daily. 60 tablet 11   No current facility-administered medications for this visit.   Social and Family History reviewed in [place]. No changes.  ROS:  Comprehensive ROV was performed: Review of Systems  Constitutional: Positive for malaise/fatigue.  HENT: Negative for nosebleeds.   Cardiovascular: Positive for claudication.  Gastrointestinal: Positive for heartburn. Negative for blood in stool and melena.  Genitourinary: Negative for hematuria.  Musculoskeletal: Positive for back pain.  Neurological: Negative for dizziness.  Endo/Heme/Allergies: Bruises/bleeds easily.  Psychiatric/Behavioral: Negative for depression.       Under significant amount of stress with his work. His assistant recently quick to move to a different job, leaving him with 2 persons' worth of work  All other systems reviewed and are  negative.   Wt Readings from Last 3 Encounters:  07/31/14 213 lb 11.2 oz (96.934 kg)  07/18/14 211 lb 1.6 oz (95.754 kg)  06/29/14 212 lb (96.163 kg)    PHYSICAL EXAM BP 138/60 mmHg  Pulse 60  Ht 5\' 8"  (1.727 m)  Wt 213 lb 11.2 oz (96.934 kg)  BMI 32.50 kg/m2 General appearance: alert, cooperative, appears stated age, no distress and Well-nourished, and well groomed. Neck: no adenopathy, no carotid bruit, no JVD and supple, symmetrical, trachea midline Lungs: CTA B., normal percussion bilaterally and Nonlabored, good air movement Heart: RRR S1, S2 normal, no murmur, click, rub or gallop, normal apical impulse and Mild discomfort left precordial wall and along the sternum. Well-healed scar. Abdomen: soft, non-tender; bowel sounds normal; no masses,  no organomegaly and Rotund abdomen Extremities: edema Trace to 1+ bilaterally. and no ulcers, gangrene or trophic changes  Pulses: 2+ and symmetric Neurologic: Grossly normal  [id number] today: No (just done in Jan)  Recent Labs: None currently available  ASSESSMENT / PLAN:  Essential hypertension Blood pressure is better today on hydralazine. My plan would be to convert from metoprolol to carvedilol. This may help with some of his energy level. I will start at 6.25 mg twice a day with plans to increase it to 12 1/2 mg twice a day. --> would hope to try to wean off of hydralazine to avoid extra medications, but will continue for now while and titrating up carvedilol.  I will have him followup with Tommy Medal, RPH-CCP  In 2 months to recheck blood pressure and further adjust medications.   Left main coronary artery disease; with RCA in-stent restenosis Doing well post CABG residual anginal symptoms. He retired from the musculoskeletal discomfort. Still trying to do the exercise regimen her cardiac rehabilitation, but mostly troubled by claudication.   S/P CABG (coronary artery bypass graft)  x 3, 07/01/13, LIMA-LAD; VG-RCA; VG-  LCX He is now one year status post CABG. With him being limited as far as ambulation with claudication, I would strongly consider a Myoview stress test after this year. We may not know of potential symptoms because of his lack of ability to ambulate.   Hyperlipidemia with target LDL less than 70; intolerance to atorvastatin and simvastatin  He is actually tolerating Crestor 10 mg every other day very well. He is due for lipid panel and CMP -- ordered today   Chronic kidney disease (CKD), stage II (mild) Has been stable postoperatively. No Recent Check.Will monitor on CMP panel -- if stable, may very well be able to use ARB   Encounter for long-term (current) use of medications Checking lipid panel and LFTs for statin use.   PAD (peripheral artery disease) both Rt and Lt disease with Lt ICA of 60-79% Monitored by Dr. Oneida Collier. The patient did not indicate that but feels that she didn't invasive evaluation at this time. Plan is to continue ambulation along with risk factor modification     Orders Placed This Encounter  Procedures  . Lipid panel    Standing Status: Future     Number of Occurrences:      Standing Expiration Date: 08/02/2015    Order Specific Question:  Has the patient fasted?    Answer:  Yes  . Comprehensive metabolic panel    Standing Status: Future     Number of Occurrences:      Standing Expiration Date: 08/02/2015    Order Specific Question:  Has the patient fasted?    Answer:  Yes   Meds ordered this encounter  Medications  . carvedilol (COREG) 12.5 MG tablet    Sig: Take 1 tablet (12.5 mg total) by mouth 2 (two) times daily.    Dispense:  60 tablet    Refill:  11    Followup: 6 months - MD; 2 month BP check with Tommy Medal RPH-CCP  [place] Ellyn Hack, M.D., M.S. Interventional Cardiology [facility]

## 2014-08-01 NOTE — Assessment & Plan Note (Signed)
Monitored by Dr. Oneida Alar. The patient did not indicate that but feels that she didn't invasive evaluation at this time. Plan is to continue ambulation along with risk factor modification

## 2014-08-01 NOTE — Assessment & Plan Note (Addendum)
Blood pressure is better today on hydralazine. My plan would be to convert from metoprolol to carvedilol. This may help with some of his energy level. I will start at 6.25 mg twice a day with plans to increase it to 12 1/2 mg twice a day. --> would hope to try to wean off of hydralazine to avoid extra medications, but will continue for now while and titrating up carvedilol.  I will have him followup with Tommy Medal, RPH-CCP  In 2 months to recheck blood pressure and further adjust medications.

## 2014-08-01 NOTE — Assessment & Plan Note (Signed)
Doing well post CABG residual anginal symptoms. He retired from the musculoskeletal discomfort. Still trying to do the exercise regimen her cardiac rehabilitation, but mostly troubled by claudication.

## 2014-08-14 ENCOUNTER — Telehealth: Payer: Self-pay | Admitting: Cardiology

## 2014-08-14 DIAGNOSIS — R0600 Dyspnea, unspecified: Secondary | ICD-10-CM | POA: Diagnosis not present

## 2014-08-14 DIAGNOSIS — I25810 Atherosclerosis of coronary artery bypass graft(s) without angina pectoris: Secondary | ICD-10-CM | POA: Diagnosis not present

## 2014-08-14 DIAGNOSIS — I10 Essential (primary) hypertension: Secondary | ICD-10-CM | POA: Diagnosis not present

## 2014-08-14 DIAGNOSIS — J209 Acute bronchitis, unspecified: Secondary | ICD-10-CM | POA: Diagnosis not present

## 2014-08-14 DIAGNOSIS — E1139 Type 2 diabetes mellitus with other diabetic ophthalmic complication: Secondary | ICD-10-CM | POA: Diagnosis not present

## 2014-08-14 NOTE — Telephone Encounter (Signed)
Pt went to PCP, got inhaler & steroidal meds.   I offered f/u appt w/ Ellyn Hack, however patient declined; he stated he will see if symptoms improve & call back if not.

## 2014-08-14 NOTE — Telephone Encounter (Signed)
Returned pt call. He is SOB since last night.  States SOB since yesterday afternoon w/ activity & rest. Reports came on w/o exercise. He does report exercising yesterday AM.  Cough & congestion x2 days.   Denies leg swelling "moreso than usual", he does have ankle & calf swelling, had been started on diuretic ~1 year ago.  Patient did not have current weight or BP to report. He is taking meds as directed.

## 2014-08-14 NOTE — Telephone Encounter (Signed)
Please continue to monitor weight and look for swelling. Suspect not cardiac. Ask PCP. Make sure he has an appointment first available

## 2014-08-14 NOTE — Telephone Encounter (Signed)
The answering service called and stated this patient called and stated he is SOB.  Please call.

## 2014-08-14 NOTE — Telephone Encounter (Signed)
Thanks for the update  [facility]

## 2014-09-19 ENCOUNTER — Telehealth: Payer: Self-pay | Admitting: Cardiology

## 2014-09-19 NOTE — Telephone Encounter (Signed)
Returned call to patient he stated he has been having sob for the past 1 to 2 months.Stated sob getting worse,hard to walk from room to room.Stated he has same symptoms he had before he had CABG.Increase swelling in both lower legs and pain in legs.Appointment offered with DOD today but patient refused.Stated he has a important meeting he has to go to.Stated he would like to be seen this week.Dr.Harding out of office this week.Dr.Barnette has a cancellation.Appointment scheduled with Dr.Koehne 09/21/14 at 9:15 am.Advised to go to ER if needed.

## 2014-09-19 NOTE — Telephone Encounter (Signed)
Mr.Tappan is calling because he is experiencing a lot  shortness of breath, lot of pain in his legs ,unable to walk . Having the exact same symptoms he had before he had to have the triple by pass . Please call   Thanks

## 2014-09-20 ENCOUNTER — Other Ambulatory Visit: Payer: Self-pay | Admitting: *Deleted

## 2014-09-20 DIAGNOSIS — E785 Hyperlipidemia, unspecified: Secondary | ICD-10-CM

## 2014-09-20 DIAGNOSIS — Z951 Presence of aortocoronary bypass graft: Secondary | ICD-10-CM

## 2014-09-20 DIAGNOSIS — Z79899 Other long term (current) drug therapy: Secondary | ICD-10-CM

## 2014-09-21 ENCOUNTER — Encounter: Payer: Self-pay | Admitting: Cardiology

## 2014-09-21 ENCOUNTER — Ambulatory Visit (INDEPENDENT_AMBULATORY_CARE_PROVIDER_SITE_OTHER): Payer: Medicare Other | Admitting: Cardiology

## 2014-09-21 VITALS — BP 154/70 | HR 58 | Ht 68.0 in | Wt 218.1 lb

## 2014-09-21 DIAGNOSIS — I6523 Occlusion and stenosis of bilateral carotid arteries: Secondary | ICD-10-CM

## 2014-09-21 DIAGNOSIS — I5033 Acute on chronic diastolic (congestive) heart failure: Secondary | ICD-10-CM | POA: Insufficient documentation

## 2014-09-21 DIAGNOSIS — I5032 Chronic diastolic (congestive) heart failure: Secondary | ICD-10-CM | POA: Insufficient documentation

## 2014-09-21 DIAGNOSIS — I25708 Atherosclerosis of coronary artery bypass graft(s), unspecified, with other forms of angina pectoris: Secondary | ICD-10-CM | POA: Diagnosis not present

## 2014-09-21 DIAGNOSIS — R0609 Other forms of dyspnea: Secondary | ICD-10-CM

## 2014-09-21 DIAGNOSIS — R0600 Dyspnea, unspecified: Secondary | ICD-10-CM

## 2014-09-21 DIAGNOSIS — I739 Peripheral vascular disease, unspecified: Secondary | ICD-10-CM | POA: Diagnosis not present

## 2014-09-21 DIAGNOSIS — Z951 Presence of aortocoronary bypass graft: Secondary | ICD-10-CM | POA: Diagnosis not present

## 2014-09-21 DIAGNOSIS — E1151 Type 2 diabetes mellitus with diabetic peripheral angiopathy without gangrene: Secondary | ICD-10-CM

## 2014-09-21 DIAGNOSIS — I10 Essential (primary) hypertension: Secondary | ICD-10-CM | POA: Diagnosis not present

## 2014-09-21 DIAGNOSIS — I209 Angina pectoris, unspecified: Secondary | ICD-10-CM | POA: Diagnosis not present

## 2014-09-21 DIAGNOSIS — I5031 Acute diastolic (congestive) heart failure: Secondary | ICD-10-CM

## 2014-09-21 LAB — CBC WITH DIFFERENTIAL/PLATELET
BASOS PCT: 1 % (ref 0–1)
Basophils Absolute: 0.1 10*3/uL (ref 0.0–0.1)
EOS ABS: 0.2 10*3/uL (ref 0.0–0.7)
Eosinophils Relative: 3 % (ref 0–5)
HCT: 32.7 % — ABNORMAL LOW (ref 39.0–52.0)
Hemoglobin: 11 g/dL — ABNORMAL LOW (ref 13.0–17.0)
Lymphocytes Relative: 26 % (ref 12–46)
Lymphs Abs: 1.4 10*3/uL (ref 0.7–4.0)
MCH: 27.8 pg (ref 26.0–34.0)
MCHC: 33.6 g/dL (ref 30.0–36.0)
MCV: 82.8 fL (ref 78.0–100.0)
MONOS PCT: 11 % (ref 3–12)
MPV: 10.3 fL (ref 8.6–12.4)
Monocytes Absolute: 0.6 10*3/uL (ref 0.1–1.0)
NEUTROS PCT: 59 % (ref 43–77)
Neutro Abs: 3.2 10*3/uL (ref 1.7–7.7)
Platelets: 289 10*3/uL (ref 150–400)
RBC: 3.95 MIL/uL — ABNORMAL LOW (ref 4.22–5.81)
RDW: 14.1 % (ref 11.5–15.5)
WBC: 5.5 10*3/uL (ref 4.0–10.5)

## 2014-09-21 LAB — COMPREHENSIVE METABOLIC PANEL
ALK PHOS: 55 U/L (ref 39–117)
ALT: 40 U/L (ref 0–53)
AST: 29 U/L (ref 0–37)
Albumin: 4.2 g/dL (ref 3.5–5.2)
BILIRUBIN TOTAL: 0.3 mg/dL (ref 0.2–1.2)
BUN: 28 mg/dL — AB (ref 6–23)
CALCIUM: 9.3 mg/dL (ref 8.4–10.5)
CO2: 25 mEq/L (ref 19–32)
Chloride: 104 mEq/L (ref 96–112)
Creat: 1.82 mg/dL — ABNORMAL HIGH (ref 0.50–1.35)
Glucose, Bld: 108 mg/dL — ABNORMAL HIGH (ref 70–99)
Potassium: 4.7 mEq/L (ref 3.5–5.3)
Sodium: 138 mEq/L (ref 135–145)
Total Protein: 6.7 g/dL (ref 6.0–8.3)

## 2014-09-21 MED ORDER — FUROSEMIDE 40 MG PO TABS: 40.0000 mg | ORAL_TABLET | Freq: Two times a day (BID) | ORAL | Status: DC

## 2014-09-21 NOTE — Progress Notes (Signed)
Lance Collier Date of Birth: 1945-10-18 Medical Record [id number]  History of Present Illness: Lance Collier is seen as a work in for Dr. Ellyn Hack. 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 He has a history of CAD with prior difficult PCI of the RCA in 2012. In January 2015 he underwent CABG x 3 by Dr Servando Snare for restenosis of the RCA and 50-70% ostial left main disease. He did well in cardiac Rehab and was exercising daily. Over the past several months he has noted increasing symptoms of dyspnea on exertion. He is concerned because this is how he felt prior to bypass. In February he was no longer able to exercise as before due to dyspnea, fatigue, and leg pain. He has been evaluated at [facility] and has moderate PAD. He has noted increased LE edema and has gained 8 lbs. He was seen recently seen by primary care and CXR was done. According to the patient this showed " bronchitis" and he was treated with mucinex and inhalers. This helped with his  Congestion but no improvement with his breathing. Prior to CABG EF was normal. He had a normal Stress Myoview in June 2014 about 6 months prior to CABG.     Medication List       This list is accurate as of: 09/21/14 11:15 AM.  Always use your most recent med list.               acetaminophen 325 MG tablet  Commonly known as:  TYLENOL  Take 650 mg by mouth as needed.     amLODipine 10 MG tablet  Commonly known as:  NORVASC  TAKE 1 TABLET ONCE DAILY.     aspirin 325 MG EC tablet  Take 1 tablet (325 mg total) by mouth daily.     B-D ULTRAFINE III SHORT PEN 31G X 8 MM Misc  Generic drug:  Insulin Pen Needle  See admin instructions.     carvedilol 12.5 MG tablet  Commonly known as:  COREG  Take 1 tablet (12.5 mg total) by mouth 2 (two) times daily.     fenofibrate 160 MG tablet  Take 160 mg by mouth daily.     furosemide 40 MG tablet  Commonly known as:  LASIX  Take 1 tablet (40 mg total) by mouth 2  (two) times daily.     HUMALOG KWIKPEN 100 UNIT/ML KiwkPen  Generic drug:  insulin lispro  Inject 10-15 Units into the skin 3 (three) times daily. Sliding scale. 10 units with breakfast and 15 units with lunch and supper     hydrALAZINE 50 MG tablet  Commonly known as:  APRESOLINE  Take 1 tablet (50 mg total) by mouth 2 (two) times daily.     insulin glargine 100 UNIT/ML injection  Commonly known as:  LANTUS  Inject 40-65 Units into the skin 2 (two) times daily. 40 units in am, 65 units in pm     nitroGLYCERIN 0.4 MG SL tablet  Commonly known as:  NITROSTAT  Place 1 tablet (0.4 mg total) under the tongue every 5 (five) minutes as needed for chest pain.     pantoprazole 40 MG tablet  Commonly known as:  PROTONIX  Take 40 mg by mouth daily.     rosuvastatin 10 MG tablet  Commonly known as:  CRESTOR  Take 1 tablet (10 mg total) by mouth every other day.         Allergies  Allergen Reactions  . Atorvastatin Other (See Comments)    Leg pain  . Ibuprofen Hives  . Pravastatin   . Simvastatin Other (See Comments)    Leg pain    Past Medical History  Diagnosis Date  . DM (diabetes mellitus) type II uncontrolled with eye manifestation 2013    Diabetic retinopathy with retinal hemorrhages since;, recurrent in August 2014 treated with Avastin shots  . Hypertension   . CAD S/P percutaneous coronary angioplasty 06/12/2011    status post complex PCI to the RCA requiring the use of GuideLiner catheter.  2  . Presence of drug coated stent in right coronary artery 06/12/2011    Subtotal proximal RCA (tortuous) --> complex PCI requiring Guideliner: 2 overlapping Promus element DES stents.  . 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  Likely thromboembolic event with PDA occlusion. 06/12/2011    Very difficult, complex procedure requiring multiple guidewires, guide-liner, etc.  Angiographic evidence of distal thrombo /  anthero-embolism as likely etiology of Troponin of ~1.58. Pt remains asymptomatic.   . 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 Aortic sclerosis  - without Stenosis  March 2014    Echo: Aortic sclerosis without stenosis. EF 55-60% with normal WM; mild concentric hypertrophy with normal relaxation.  Marland Kitchen PAD (peripheral artery disease)     with claudication  . High cholesterol   . Migraine ~ 2004    "once"  . Anemia   . 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 GERD (gastroesophageal reflux disease)   . Anxiety     Past Surgical History  Procedure Laterality Date  . Tonsillectomy    . Nm myoview ltd  January '13    Diaphragmatic attenuation versus inferior infarct. No ischemia. Normal EF normal wall motion.  Marland Kitchen Nm myoview ltd  June 2014    no ischemia  . Coronary angioplasty with stent placement Right September 2012    2 overlapping Promus DES 2.7 mm at 12 mm x2; postdilated to 3 mm  . Cardiac catheterization  06/28/2013    ostial LM disese, RCA ~40-50%ISR  . Retinal laser procedure Bilateral     "more than once"   . Coronary artery bypass graft N/A 07/01/2013    Procedure: CORONARY ARTERY BYPASS GRAFTING (CABG);  Surgeon: Grace Isaac, MD;  Location: [place];  Service: Open Heart Surgery;  Laterality: N/A;  Times 3 using left internal mammary artery and endoscopically harvested bilateral saphenous vein  . Intraoperative transesophageal echocardiogram N/A 07/01/2013    Procedure: INTRAOPERATIVE TRANSESOPHAGEAL ECHOCARDIOGRAM;  Surgeon: Grace Isaac, MD;  Location: [place];  Service: Open Heart Surgery;  Laterality: N/A;  . Transthoracic echocardiogram  06/29/2013    Focal basal hypertrophy with normal LV size. EF 60-65% no regional WMA. Mild LA dilation  . Left heart catheterization with coronary angiogram N/A 06/11/2011    Procedure: LEFT HEART CATHETERIZATION WITH CORONARY ANGIOGRAM;  Surgeon: Fulton Reek, MD;  Location:  [facility] CATH LAB;  Service: Cardiovascular;  Laterality: N/A;  . Percutaneous coronary stent intervention (pci-s)  06/11/2011    Procedure: PERCUTANEOUS CORONARY STENT INTERVENTION (PCI-S);  Surgeon: Fulton Reek, MD;  Location: [facility] CATH LAB;  Service: Cardiovascular;;  . Left heart catheterization with coronary angiogram N/A 06/28/2013    Procedure: LEFT HEART CATHETERIZATION WITH CORONARY ANGIOGRAM;  Surgeon: Leonie Man, MD;  Location: [facility] CATH LAB;  Service: Cardiovascular;  Laterality: N/A;    History   Social History  . Marital Status: Single    Spouse Name: N/A  . Number of Children: N/A  . Years of Education: N/A   Social History Main Topics  . Smoking status: Former Smoker -- 2.00 packs/day for 27 years    Types: Cigarettes    Quit date: 11/23/1996  . Smokeless tobacco: Never Used  . Alcohol Use: 0.0 oz/week     Comment: 06/28/2013 "haven't had any alcohol in several weeks"  . Drug Use: No  . Sexual Activity: Not Currently   Other Topics Concern  . 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 He notably has not been exercising like he used to. He does walk back and [place] He has social alcoholic beverages but is trying to cut that back and does not smoke.       He tells me he is a sucker for chips but does not eat sweets because of diabetes.    Family History  Problem Relation Age of Onset  . Cancer Sister     Lung cancer  . Cancer Mother   . Cancer Father   . Hyperlipidemia Father   . Hypertension Father   . Cancer Maternal Grandmother   . Cancer Paternal Grandfather     Review of Systems: As noted in HPI.  All other systems were reviewed and are negative.  Physical Exam: BP 154/70 mmHg  Pulse 58  Ht 5\' 8"  (1.727 m)  Wt 218 lb 1.6 oz (98.93 kg)  BMI 33.17 kg/m2 Filed Weights   09/21/14 0933  Weight: 218 lb 1.6 oz (98.93 kg)  GENERAL:  Well  appearing, overweight WM in NAD HEENT:  PERRL, EOMI, sclera are clear. Oropharynx is clear. NECK:  + jugular venous distention, carotid upstroke brisk and symmetric, no bruits, no thyromegaly or adenopathy LUNGS:  Clear to auscultation bilaterally CHEST:  Unremarkable HEART:  RRR,  PMI not displaced or sustained,S1 and S2 within normal limits, no S3, no S4: no clicks, no rubs, no murmurs ABD:  Soft, nontender. BS +, no masses or bruits. No hepatomegaly, no splenomegaly EXT:  2 + pulses throughout, 2+ edema, no cyanosis no clubbing SKIN:  Warm and dry.  No rashes NEURO:  Alert and oriented x 3. Cranial nerves II through XII intact. PSYCH:  Cognitively intact    LABORATORY DATA: Ecg today shows NSR with rate 58. LVH. No acute change. I have personally reviewed and interpreted this study.   Assessment / Plan: 1. Dyspnea on exertion. He has evidence of weight gain and increased edema consistent with Acute diastolic CHF. This may be related to coronary ischemia since this is how he presented before. Will check CMET, CBC, and BNP level today. Recommend Echo. Will increase lasix to 40 mg bid. Arrange follow up in 1-2 weeks to see how he is responding to therapy. If no improvement despite diuresis may need to consider cardiac cath. I don't know that a Myoview will be very helpful based on prior history. If cardiac cath is needed he will need to be pre hydrated due to CKD.  2. CAD s/p CABG in January 2015.   3. HTN  4. PAD with moderate claudication.  5. DM with renal and vascular complications.  6. CKD stage 3.

## 2014-09-21 NOTE — Patient Instructions (Signed)
Increase your lasix to 40 mg twice a day  We will schedule you for an Echocardiogram  We will check lab work today  We will schedule follow up in one to two weeks.

## 2014-09-22 DIAGNOSIS — Z79899 Other long term (current) drug therapy: Secondary | ICD-10-CM | POA: Diagnosis not present

## 2014-09-22 DIAGNOSIS — Z951 Presence of aortocoronary bypass graft: Secondary | ICD-10-CM | POA: Diagnosis not present

## 2014-09-22 DIAGNOSIS — I25810 Atherosclerosis of coronary artery bypass graft(s) without angina pectoris: Secondary | ICD-10-CM

## 2014-09-22 DIAGNOSIS — E785 Hyperlipidemia, unspecified: Secondary | ICD-10-CM | POA: Diagnosis not present

## 2014-09-22 HISTORY — DX: Atherosclerosis of coronary artery bypass graft(s) without angina pectoris: I25.810

## 2014-09-22 LAB — PRO B NATRIURETIC PEPTIDE: PRO B NATRI PEPTIDE: 349.4 pg/mL — AB (ref <126)

## 2014-09-23 LAB — LIPID PANEL
Cholesterol: 193 mg/dL (ref 0–200)
HDL: 22 mg/dL — ABNORMAL LOW (ref >=40)
LDL Cholesterol: 98 mg/dL (ref 0–99)
Total CHOL/HDL Ratio: 8.8 Ratio
Triglycerides: 367 mg/dL — ABNORMAL HIGH (ref <150)
VLDL: 73 mg/dL — AB (ref 0–40)

## 2014-09-25 ENCOUNTER — Ambulatory Visit (HOSPITAL_COMMUNITY)
Admission: RE | Admit: 2014-09-25 | Discharge: 2014-09-25 | Disposition: A | Discharge status: Home / Self Care | Payer: Medicare Other | Source: Ambulatory Visit | Attending: Cardiology | Admitting: Cardiology

## 2014-09-25 DIAGNOSIS — I739 Peripheral vascular disease, unspecified: Secondary | ICD-10-CM | POA: Diagnosis not present

## 2014-09-25 DIAGNOSIS — E1151 Type 2 diabetes mellitus with diabetic peripheral angiopathy without gangrene: Secondary | ICD-10-CM | POA: Diagnosis not present

## 2014-09-25 DIAGNOSIS — I25708 Atherosclerosis of coronary artery bypass graft(s), unspecified, with other forms of angina pectoris: Secondary | ICD-10-CM | POA: Insufficient documentation

## 2014-09-25 DIAGNOSIS — I10 Essential (primary) hypertension: Secondary | ICD-10-CM

## 2014-09-25 DIAGNOSIS — R0609 Other forms of dyspnea: Secondary | ICD-10-CM | POA: Insufficient documentation

## 2014-09-25 DIAGNOSIS — Z951 Presence of aortocoronary bypass graft: Secondary | ICD-10-CM | POA: Insufficient documentation

## 2014-09-25 DIAGNOSIS — R0600 Dyspnea, unspecified: Secondary | ICD-10-CM

## 2014-09-25 NOTE — Progress Notes (Signed)
2D Echo Performed 09/25/2014    Marygrace Drought, RCS

## 2014-09-26 DIAGNOSIS — E785 Hyperlipidemia, unspecified: Secondary | ICD-10-CM | POA: Diagnosis not present

## 2014-09-26 DIAGNOSIS — I739 Peripheral vascular disease, unspecified: Secondary | ICD-10-CM | POA: Diagnosis not present

## 2014-09-26 DIAGNOSIS — E11359 Type 2 diabetes mellitus with proliferative diabetic retinopathy without macular edema: Secondary | ICD-10-CM | POA: Diagnosis not present

## 2014-09-26 DIAGNOSIS — E1142 Type 2 diabetes mellitus with diabetic polyneuropathy: Secondary | ICD-10-CM | POA: Diagnosis not present

## 2014-09-26 DIAGNOSIS — I2510 Atherosclerotic heart disease of native coronary artery without angina pectoris: Secondary | ICD-10-CM | POA: Diagnosis not present

## 2014-09-26 DIAGNOSIS — I6529 Occlusion and stenosis of unspecified carotid artery: Secondary | ICD-10-CM | POA: Diagnosis not present

## 2014-09-26 DIAGNOSIS — N183 Chronic kidney disease, stage 3 (moderate): Secondary | ICD-10-CM | POA: Diagnosis not present

## 2014-09-26 DIAGNOSIS — Z1389 Encounter for screening for other disorder: Secondary | ICD-10-CM | POA: Diagnosis not present

## 2014-09-26 DIAGNOSIS — I10 Essential (primary) hypertension: Secondary | ICD-10-CM | POA: Diagnosis not present

## 2014-09-26 DIAGNOSIS — Z6831 Body mass index (BMI) 31.0-31.9, adult: Secondary | ICD-10-CM | POA: Diagnosis not present

## 2014-09-26 DIAGNOSIS — E1139 Type 2 diabetes mellitus with other diabetic ophthalmic complication: Secondary | ICD-10-CM | POA: Diagnosis not present

## 2014-09-26 DIAGNOSIS — D509 Iron deficiency anemia, unspecified: Secondary | ICD-10-CM | POA: Diagnosis not present

## 2014-09-28 ENCOUNTER — Encounter: Payer: Self-pay | Admitting: Cardiology

## 2014-09-28 ENCOUNTER — Ambulatory Visit (INDEPENDENT_AMBULATORY_CARE_PROVIDER_SITE_OTHER): Payer: Medicare Other | Admitting: Cardiology

## 2014-09-28 VITALS — BP 136/58 | HR 56 | Ht 68.0 in | Wt 216.6 lb

## 2014-09-28 DIAGNOSIS — I208 Other forms of angina pectoris: Secondary | ICD-10-CM

## 2014-09-28 DIAGNOSIS — N182 Chronic kidney disease, stage 2 (mild): Secondary | ICD-10-CM

## 2014-09-28 DIAGNOSIS — Z951 Presence of aortocoronary bypass graft: Secondary | ICD-10-CM | POA: Diagnosis not present

## 2014-09-28 DIAGNOSIS — I2510 Atherosclerotic heart disease of native coronary artery without angina pectoris: Secondary | ICD-10-CM | POA: Diagnosis not present

## 2014-09-28 DIAGNOSIS — I25118 Atherosclerotic heart disease of native coronary artery with other forms of angina pectoris: Secondary | ICD-10-CM

## 2014-09-28 DIAGNOSIS — E785 Hyperlipidemia, unspecified: Secondary | ICD-10-CM

## 2014-09-28 DIAGNOSIS — R0600 Dyspnea, unspecified: Secondary | ICD-10-CM

## 2014-09-28 DIAGNOSIS — I5031 Acute diastolic (congestive) heart failure: Secondary | ICD-10-CM | POA: Diagnosis not present

## 2014-09-28 DIAGNOSIS — R0609 Other forms of dyspnea: Secondary | ICD-10-CM

## 2014-09-28 DIAGNOSIS — I10 Essential (primary) hypertension: Secondary | ICD-10-CM

## 2014-09-28 DIAGNOSIS — I209 Angina pectoris, unspecified: Secondary | ICD-10-CM

## 2014-09-28 MED ORDER — ISOSORBIDE MONONITRATE ER 30 MG PO TB24: 30.0000 mg | ORAL_TABLET | Freq: Every day | ORAL | Status: DC

## 2014-09-28 NOTE — Patient Instructions (Addendum)
ADMIT THE DAY BEFORE , -RIGHT AND LEFT HEART CATH  W/GRAFTS---RIGHT GROIN--DR HARDING. Your physician has requested that you have a cardiac catheterization. Cardiac catheterization is used to diagnose and/or treat various heart conditions. Doctors may recommend this procedure for a number of different reasons. The most common reason is to evaluate chest pain. Chest pain can be a symptom of coronary artery disease (CAD), and cardiac catheterization can show whether plaque is narrowing or blocking your heart's arteries. This procedure is also used to evaluate the valves, as well as measure the blood flow and oxygen levels in different parts of your heart. For further information please visit [email or web address]. Please follow instruction sheet, as given.  INCREASE IMDUR 30 MG  TAKE ONE DAILY.  CONTINUE WITH CURRENT DOSE OF LASIX.  Your physician recommends that you schedule a follow-up appointment i AFTER CATH 30 MIN APPOINTMENT

## 2014-09-29 ENCOUNTER — Ambulatory Visit: Payer: Medicare Other | Admitting: Pharmacist Clinician (PhC)/ Clinical Pharmacy Specialist

## 2014-09-29 ENCOUNTER — Encounter: Payer: Self-pay | Admitting: Cardiology

## 2014-09-30 ENCOUNTER — Encounter: Payer: Self-pay | Admitting: Cardiology

## 2014-09-30 DIAGNOSIS — I209 Angina pectoris, unspecified: Secondary | ICD-10-CM

## 2014-09-30 NOTE — Assessment & Plan Note (Addendum)
He really didn't have true anginal symptoms in the past is mostly exertional dyspnea but he is also having some discomfort.  Is on aspirin statin beta blocker calcium channel blocker and hydralazine. We are not using ACE inhibitor/ARB due to his renal insufficiency.  Plan: Right and left heart catheterization; Imdur - current medications.

## 2014-09-30 NOTE — Assessment & Plan Note (Signed)
Actually relatively well-controlled now with hydralazine and carvedilol with amlodipine.

## 2014-09-30 NOTE — Assessment & Plan Note (Signed)
Lipids not at goal. Post cath we can discuss increasing Crestor dosing versus suggesting other options such as PC SK 9 Inhibitors'

## 2014-09-30 NOTE — Assessment & Plan Note (Signed)
1 year post CABG. Very difficult intervention of the RCA in the past. We'll perform this catheterization via femoral approach due to the difficulty with RCA in the past.

## 2014-09-30 NOTE — Progress Notes (Addendum)
PATIENT: Lance Collier MRN: [id number]  DOB: 10-Nov-1945   DOV:10/01/2014 PCP: Sheela Stack, MD  Clinic Note: Chief Complaint  Patient presents with  . Follow-up    Echo and lab results.  Increasing SOB with activity.  Still exercising but it is more of a struggle.  No chest pain. Swelling and pain of legs and feet.  Swelling worse in left leg.  No dizziness.   HPI: Lance Collier is a 69 y.o.  male with a PMH below who presents today for a followup visit. Cardiac Histroy:  Dec 2012 - Complex RCA PCI (Dr. 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 Servando Snare)  Echo 06/29/2013: EF 60-65%, Aortic Sclerosis. No regional WMA  Graduated from [facility] in 10/2013  2-D echocardiogram ordered April 4:mild concentric LVH. EF 55-60%. Aortic sclerosis no stenosis. Moderate MAC. Mild to moderate TR. PA pressure 34 mm mercury to  Interval History:   He was seen by Dr. Peter Collier on March 31 and was complaining of worsening exertional dyspnea as well as lower extremity edema and weight gain.  His diuretic dose was increased to 40 mg twice a day, he has noted some improvement of his edema but has still continued to have significant exertional dyspnea with chest tightness if he pushes beyond that. He gets short of breath simply walking from his handicap parking space into his office to  He is concerned as he is now is starting to have some discomfort in his chest as well as exertional dyspnea. Her shortness of the head prior to his first stent and prior to his CABG. Tonsil was decreased significantly. He doesn't think that this is just related to his claudication as well as occasional muscle and back in February. He has noted some mild PND and orthopnea but this is not better with the increased dose of Lasix.  HEENT [place] Artist and diastolic dysfunction from his  echocardiogram but the LV dysfunction is not consistent with the level of symptoms he is having to  His claudication is relatively stable -- he recently saw Dr. Oneida Alar, and the recommendation remains continued walking regimen and medical management.  He denies any headaches or blurred vision, palpitations or irregular heartbeats. No syncope/near syncope or TIA symptoms or associated symptoms. No myalgias or arthralgias from Crestor 10 mg every other day..  Past Medical History  Diagnosis Date  . DM (diabetes mellitus) type II uncontrolled with eye manifestation 2013    Diabetic retinopathy with retinal hemorrhages since;, recurrent in August 2014 treated with Avastin shots  . Hypertension   . CAD S/P percutaneous coronary angioplasty 06/12/2011    status post complex PCI to the RCA requiring the use of GuideLiner catheter.  2  . Presence of drug coated stent in right coronary artery 06/12/2011    Subtotal proximal RCA (tortuous) --> complex PCI requiring Guideliner: 2 overlapping Promus element DES stents.  . 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  Likely thromboembolic event with PDA occlusion. 06/12/2011    Very difficult, complex procedure requiring multiple guidewires, guide-liner, etc.  Angiographic evidence of distal thrombo / anthero-embolism as likely etiology of Troponin of ~1.58. Pt remains asymptomatic.   . 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  Aortic sclerosis  - without Stenosis  March 2014    Echo: Aortic sclerosis without stenosis. EF 55-60% with normal WM; mild concentric hypertrophy with normal relaxation.  Marland Kitchen PAD (peripheral artery disease)     with claudication  . High cholesterol   . Migraine ~ 2004    "once"  . Anemia   . 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 GERD (gastroesophageal reflux disease)   . Anxiety    Prior Cardiac Evaluation and Past  Surgical History: reviewed in Epic  Allergies  Allergen Reactions  . Atorvastatin Other (See Comments)    Leg pain  . Ibuprofen Hives  . Pravastatin   . Simvastatin Other (See Comments)    Leg pain   is tolerating every other day Crestor 10 mg  Current Outpatient Prescriptions  Medication Sig Dispense Refill  . acetaminophen (TYLENOL) 325 MG tablet Take 650 mg by mouth as needed.    Marland Kitchen amLODipine (NORVASC) 10 MG tablet TAKE 1 TABLET ONCE DAILY. 30 tablet 9  . aspirin EC 325 MG EC tablet Take 1 tablet (325 mg total) by mouth daily. 30 tablet 0  . B-D ULTRAFINE III SHORT PEN 31G X 8 MM MISC See admin instructions.  0  . carvedilol (COREG) 12.5 MG tablet Take 1 tablet (12.5 mg total) by mouth 2 (two) times daily. 60 tablet 11  . fenofibrate 160 MG tablet Take 160 mg by mouth daily.    . furosemide (LASIX) 40 MG tablet Take 1 tablet (40 mg total) by mouth 2 (two) times daily. 90 tablet 3  . HUMALOG KWIKPEN 100 UNIT/ML SOPN Inject 10-15 Units into the skin 3 (three) times daily. Sliding scale. 10 units with breakfast and 15 units with lunch and supper    . hydrALAZINE (APRESOLINE) 50 MG tablet Take 1 tablet (50 mg total) by mouth 2 (two) times daily. 60 tablet 6  . insulin glargine (LANTUS) 100 UNIT/ML injection Inject 40-70 Units into the skin 2 (two) times daily. 40 units in am, 70 units in pm    . nitroGLYCERIN (NITROSTAT) 0.4 MG SL tablet Place 1 tablet (0.4 mg total) under the tongue every 5 (five) minutes as needed for chest pain. 90 tablet 3  . pantoprazole (PROTONIX) 40 MG tablet Take 40 mg by mouth daily.    . rosuvastatin (CRESTOR) 10 MG tablet Take 1 tablet (10 mg total) by mouth every other day. 15 tablet 11  . isosorbide mononitrate (IMDUR) 30 MG 24 hr tablet Take 1 tablet (30 mg total) by mouth daily. 30 tablet 6   No current facility-administered medications for this visit.   History  Substance Use Topics  . Smoking status: Former Smoker -- 2.00 packs/day for 27 years     Types: Cigarettes    Quit date: 11/23/1996  . Smokeless tobacco: Never Used  . Alcohol Use: 0.0 oz/week     Comment: 06/28/2013 "haven't had any alcohol in several weeks"   Family History  Problem Relation Age of Onset  . Cancer Sister     Lung cancer  . Cancer Mother   . Cancer Father   . Hyperlipidemia Father   . Hypertension Father   . Cancer Maternal Grandmother   . Cancer Paternal Grandfather    ROS:  Comprehensive ROV was performed: Review of Systems  Constitutional: Negative for malaise/fatigue.  HENT: Negative for nosebleeds.   Respiratory: Positive for shortness of breath (with exertion).   Cardiovascular: Positive for orthopnea, claudication and leg swelling. Negative for palpitations.  Gastrointestinal: Negative for blood in stool and melena.  Genitourinary: Negative for hematuria.  Musculoskeletal: Positive for joint pain.  Neurological: Negative for dizziness and loss of consciousness.  Endo/Heme/Allergies: Does not bruise/bleed easily.  Psychiatric/Behavioral: Negative for depression.    Wt Readings from Last 3 Encounters:  09/28/14 216 lb 9.6 oz (98.249 kg)  09/21/14 218 lb 1.6 oz (98.93 kg)  07/31/14 213 lb 11.2 oz (96.934 kg)    PHYSICAL EXAM BP 136/58 mmHg  Pulse 56  Ht 5\' 8"  (1.727 m)  Wt 216 lb 9.6 oz (98.249 kg)  BMI 32.94 kg/m2 General appearance: alert, cooperative, appears stated age, no distress and Well-nourished, and well groomed. Neck: no adenopathy, no carotid bruit, no JVD and supple, symmetrical, trachea midline Lungs: CTA B., normal percussion bilaterally and Nonlabored, good air movement Heart: RRR S1, S2 normal, no murmur, click, rub or gallop, normal apical impulse and Mild discomfort left precordial wall and along the sternum. Well-healed scar. Abdomen: soft, non-tender; bowel sounds normal; no masses,  no organomegaly and Rotund abdomen Extremities: 1-2++ bilaterally. and no ulcers, gangrene or trophic changes Pulses: 2+ and  symmetric Neurologic: Grossly normal  [id number] today:   Recent Labs:  Lab Results  Component Value Date   CHOL 193 09/20/2014   HDL 22* 09/20/2014   LDLCALC 98 09/20/2014   TRIG 367* 09/20/2014   CHOLHDL 8.8 09/20/2014   ASSESSMENT / PLAN: Problem List Items Addressed This Visit    Acute diastolic CHF (congestive heart failure)    Orthopnea PND and edema improved with diuresis, however he continues to have significant exertional dyspnea with minimal exertion. We'll perform right heart catheterization to assess filling pressures.  For now we'll continue current dose of Lasix and afterload reducing agents. We may need to titrate hydralazine , but will see the accommodation of hydralazine plus Imdur.      Relevant Medications   isosorbide mononitrate (IMDUR) 24 hr tablet   Other Relevant Orders   LEFT AND RIGHT HEART CATHETERIZATION WITH CORONARY/GRAFT ANGIOGRAM   Angina, class III - Primary    I concerned that his exertional dyspnea is not associated with heart failure but may very well be related to ischemic disease. His symptoms are very similar to pre-CABG. We discussed potential options of pursuing noninvasive evaluation versus invasive evaluation. He has a pending trip to [place] in the end of May and would like to get this resolved. His symptoms have progressively worsened over last month to the point where he really can't do anything. I think this is at least class III is not possible angina equivalent.  After discussing options of invasive versus noninvasive option he chose to proceed with invasive evaluation we will perform left and right heart catheterization in order to confirm or deny presence of pulmonary hypertension as well as coronary disease. According did not suggest elevated pulmonary pressures however he is noticing edema that is not necessarily explain the extent of lethargy for dysfunction.  Plan: Left and right heart catheterization, initiate Imdur 30 mg daily.  Continue current CAD medication.      Relevant Medications   isosorbide mononitrate (IMDUR) 24 hr tablet   Other Relevant Orders   LEFT AND RIGHT HEART CATHETERIZATION WITH CORONARY/GRAFT ANGIOGRAM   Chronic kidney disease (CKD), stage II (mild) (Chronic)    Due to his chronic renal insufficiency, he'll be admitted the night prior to his catheterization for IV hydration.      Coronary artery disease involving native coronary artery with other forms of angina  pectoris    He really didn't have true anginal symptoms in the past is mostly exertional dyspnea but he is also having some discomfort.  Is on aspirin statin beta blocker calcium channel blocker and hydralazine. We are not using ACE inhibitor/ARB due to his renal insufficiency.  Plan: Right and left heart catheterization; Imdur - current medications.      Relevant Medications   isosorbide mononitrate (IMDUR) 24 hr tablet   DOE (dyspnea on exertion) (Chronic)   Relevant Medications   isosorbide mononitrate (IMDUR) 24 hr tablet   Other Relevant Orders   LEFT AND RIGHT HEART CATHETERIZATION WITH CORONARY/GRAFT ANGIOGRAM   Essential hypertension (Chronic)    Actually relatively well-controlled now with hydralazine and carvedilol with amlodipine.      Relevant Medications   isosorbide mononitrate (IMDUR) 24 hr tablet   Hyperlipidemia with target LDL less than 70; intolerance to atorvastatin and simvastatin (Chronic)    Lipids not at goal. Post cath we can discuss increasing Crestor dosing versus suggesting other options such as PC SK 9 Inhibitors'      Relevant Medications   isosorbide mononitrate (IMDUR) 24 hr tablet   Left main coronary artery disease; with RCA in-stent restenosis (Chronic)   Relevant Medications   isosorbide mononitrate (IMDUR) 24 hr tablet   Other Relevant Orders   LEFT AND RIGHT HEART CATHETERIZATION WITH CORONARY/GRAFT ANGIOGRAM   S/P CABG (coronary artery bypass graft)  x 3, 07/01/13, LIMA-LAD; VG-RCA;  VG- LCX (Chronic)    1 year post CABG. Very difficult intervention of the RCA in the past. We'll perform this catheterization via femoral approach due to the difficulty with RCA in the past.      Relevant Medications   isosorbide mononitrate (IMDUR) 24 hr tablet   Other Relevant Orders   LEFT AND RIGHT HEART CATHETERIZATION WITH CORONARY/GRAFT ANGIOGRAM      Orders Placed This Encounter  Procedures  . LEFT AND RIGHT HEART CATHETERIZATION WITH CORONARY/GRAFT ANGIOGRAM   Meds ordered this encounter  Medications  . isosorbide mononitrate (IMDUR) 30 MG 24 hr tablet    Sig: Take 1 tablet (30 mg total) by mouth daily.    Dispense:  30 tablet    Refill:  6   Followup: ~2-3 weeks post CATH  DAVID W. Ellyn Hack, M.D., M.S. Interventional Cardiology [facility]

## 2014-09-30 NOTE — Assessment & Plan Note (Addendum)
I concerned that his exertional dyspnea is not associated with heart failure but may very well be related to ischemic disease. His symptoms are very similar to pre-CABG. We discussed potential options of pursuing noninvasive evaluation versus invasive evaluation. He has a pending trip to [place] in the end of May and would like to get this resolved. His symptoms have progressively worsened over last month to the point where he really can't do anything. I think this is at least class III is not possible angina equivalent.  After discussing options of invasive versus noninvasive option he chose to proceed with invasive evaluation we will perform left and right heart catheterization in order to confirm or deny presence of pulmonary hypertension as well as coronary disease. According did not suggest elevated pulmonary pressures however he is noticing edema that is not necessarily explain the extent of lethargy for dysfunction.  Plan: Left and right heart catheterization, initiate Imdur 30 mg daily. Continue current CAD medication.

## 2014-09-30 NOTE — Assessment & Plan Note (Signed)
Orthopnea PND and edema improved with diuresis, however he continues to have significant exertional dyspnea with minimal exertion. We'll perform right heart catheterization to assess filling pressures.  For now we'll continue current dose of Lasix and afterload reducing agents. We may need to titrate hydralazine , but will see the accommodation of hydralazine plus Imdur.

## 2014-10-01 NOTE — Assessment & Plan Note (Signed)
Due to his chronic renal insufficiency, he'll be admitted the night prior to his catheterization for IV hydration.

## 2014-10-02 ENCOUNTER — Encounter (HOSPITAL_COMMUNITY): Payer: Self-pay | Admitting: General Practice

## 2014-10-02 ENCOUNTER — Telehealth: Payer: Self-pay | Admitting: *Deleted

## 2014-10-02 ENCOUNTER — Observation Stay (HOSPITAL_COMMUNITY)
Admission: RE | Admit: 2014-10-02 | Discharge: 2014-10-03 | Disposition: A | Discharge status: Home / Self Care | Payer: Medicare Other | Source: Ambulatory Visit | Attending: Cardiology | Admitting: Cardiology

## 2014-10-02 DIAGNOSIS — I5031 Acute diastolic (congestive) heart failure: Secondary | ICD-10-CM

## 2014-10-02 DIAGNOSIS — F419 Anxiety disorder, unspecified: Secondary | ICD-10-CM | POA: Insufficient documentation

## 2014-10-02 DIAGNOSIS — T82858A Stenosis of vascular prosthetic devices, implants and grafts, initial encounter: Secondary | ICD-10-CM | POA: Insufficient documentation

## 2014-10-02 DIAGNOSIS — K219 Gastro-esophageal reflux disease without esophagitis: Secondary | ICD-10-CM | POA: Insufficient documentation

## 2014-10-02 DIAGNOSIS — I739 Peripheral vascular disease, unspecified: Secondary | ICD-10-CM | POA: Diagnosis not present

## 2014-10-02 DIAGNOSIS — Z87891 Personal history of nicotine dependence: Secondary | ICD-10-CM | POA: Insufficient documentation

## 2014-10-02 DIAGNOSIS — D649 Anemia, unspecified: Secondary | ICD-10-CM | POA: Insufficient documentation

## 2014-10-02 DIAGNOSIS — I25119 Atherosclerotic heart disease of native coronary artery with unspecified angina pectoris: Principal | ICD-10-CM | POA: Insufficient documentation

## 2014-10-02 DIAGNOSIS — Z79899 Other long term (current) drug therapy: Secondary | ICD-10-CM | POA: Insufficient documentation

## 2014-10-02 DIAGNOSIS — I209 Angina pectoris, unspecified: Secondary | ICD-10-CM

## 2014-10-02 DIAGNOSIS — Y832 Surgical operation with anastomosis, bypass or graft as the cause of abnormal reaction of the patient, or of later complication, without mention of misadventure at the time of the procedure: Secondary | ICD-10-CM | POA: Insufficient documentation

## 2014-10-02 DIAGNOSIS — E119 Type 2 diabetes mellitus without complications: Secondary | ICD-10-CM

## 2014-10-02 DIAGNOSIS — I129 Hypertensive chronic kidney disease with stage 1 through stage 4 chronic kidney disease, or unspecified chronic kidney disease: Secondary | ICD-10-CM | POA: Insufficient documentation

## 2014-10-02 DIAGNOSIS — Z7982 Long term (current) use of aspirin: Secondary | ICD-10-CM | POA: Diagnosis not present

## 2014-10-02 DIAGNOSIS — Z794 Long term (current) use of insulin: Secondary | ICD-10-CM | POA: Insufficient documentation

## 2014-10-02 DIAGNOSIS — I252 Old myocardial infarction: Secondary | ICD-10-CM | POA: Insufficient documentation

## 2014-10-02 DIAGNOSIS — E669 Obesity, unspecified: Secondary | ICD-10-CM | POA: Diagnosis present

## 2014-10-02 DIAGNOSIS — I208 Other forms of angina pectoris: Secondary | ICD-10-CM | POA: Diagnosis not present

## 2014-10-02 DIAGNOSIS — E780 Pure hypercholesterolemia: Secondary | ICD-10-CM | POA: Diagnosis not present

## 2014-10-02 DIAGNOSIS — R0600 Dyspnea, unspecified: Secondary | ICD-10-CM

## 2014-10-02 DIAGNOSIS — E11319 Type 2 diabetes mellitus with unspecified diabetic retinopathy without macular edema: Secondary | ICD-10-CM | POA: Diagnosis not present

## 2014-10-02 DIAGNOSIS — I2510 Atherosclerotic heart disease of native coronary artery without angina pectoris: Secondary | ICD-10-CM

## 2014-10-02 DIAGNOSIS — N182 Chronic kidney disease, stage 2 (mild): Secondary | ICD-10-CM | POA: Diagnosis present

## 2014-10-02 DIAGNOSIS — I25729 Atherosclerosis of autologous artery coronary artery bypass graft(s) with unspecified angina pectoris: Secondary | ICD-10-CM | POA: Diagnosis not present

## 2014-10-02 DIAGNOSIS — Z6832 Body mass index (BMI) 32.0-32.9, adult: Secondary | ICD-10-CM | POA: Insufficient documentation

## 2014-10-02 DIAGNOSIS — Z955 Presence of coronary angioplasty implant and graft: Secondary | ICD-10-CM | POA: Insufficient documentation

## 2014-10-02 DIAGNOSIS — I358 Other nonrheumatic aortic valve disorders: Secondary | ICD-10-CM | POA: Insufficient documentation

## 2014-10-02 DIAGNOSIS — Z951 Presence of aortocoronary bypass graft: Secondary | ICD-10-CM

## 2014-10-02 DIAGNOSIS — R0609 Other forms of dyspnea: Secondary | ICD-10-CM

## 2014-10-02 DIAGNOSIS — I10 Essential (primary) hypertension: Secondary | ICD-10-CM | POA: Diagnosis present

## 2014-10-02 DIAGNOSIS — Z9861 Coronary angioplasty status: Secondary | ICD-10-CM

## 2014-10-02 DIAGNOSIS — E785 Hyperlipidemia, unspecified: Secondary | ICD-10-CM | POA: Diagnosis present

## 2014-10-02 HISTORY — DX: Unspecified asthma, uncomplicated: J45.909

## 2014-10-02 HISTORY — DX: Adverse effect of unspecified anesthetic, initial encounter: T41.45XA

## 2014-10-02 HISTORY — DX: Cardiac murmur, unspecified: R01.1

## 2014-10-02 HISTORY — DX: Other complications of anesthesia, initial encounter: T88.59XA

## 2014-10-02 LAB — BASIC METABOLIC PANEL
Anion gap: 13 (ref 5–15)
BUN: 42 mg/dL — ABNORMAL HIGH (ref 6–23)
CO2: 25 mmol/L (ref 19–32)
Calcium: 9 mg/dL (ref 8.4–10.5)
Chloride: 97 mmol/L (ref 96–112)
Creatinine, Ser: 2.54 mg/dL — ABNORMAL HIGH (ref 0.50–1.35)
GFR calc Af Amer: 28 mL/min — ABNORMAL LOW (ref >90)
GFR calc non Af Amer: 24 mL/min — ABNORMAL LOW (ref >90)
GLUCOSE: 395 mg/dL — AB (ref 70–99)
POTASSIUM: 4.1 mmol/L (ref 3.5–5.1)
SODIUM: 135 mmol/L (ref 135–145)

## 2014-10-02 LAB — CBC
HCT: 34.2 % — ABNORMAL LOW (ref 39.0–52.0)
HEMOGLOBIN: 10.9 g/dL — AB (ref 13.0–17.0)
MCH: 27.1 pg (ref 26.0–34.0)
MCHC: 31.9 g/dL (ref 30.0–36.0)
MCV: 85.1 fL (ref 78.0–100.0)
Platelets: 263 10*3/uL (ref 150–400)
RBC: 4.02 MIL/uL — AB (ref 4.22–5.81)
RDW: 13 % (ref 11.5–15.5)
WBC: 5.5 10*3/uL (ref 4.0–10.5)

## 2014-10-02 LAB — GLUCOSE, CAPILLARY
Glucose-Capillary: 284 mg/dL — ABNORMAL HIGH (ref 70–99)
Glucose-Capillary: 338 mg/dL — ABNORMAL HIGH (ref 70–99)
Glucose-Capillary: 373 mg/dL — ABNORMAL HIGH (ref 70–99)

## 2014-10-02 MED ORDER — ASPIRIN EC 325 MG PO TBEC
325.0000 mg | DELAYED_RELEASE_TABLET | Freq: Every day | ORAL | Status: DC
  Administered 2014-10-02: 325 mg via ORAL
  Filled 2014-10-02 (×2): qty 1

## 2014-10-02 MED ORDER — INSULIN ASPART 100 UNIT/ML ~~LOC~~ SOLN
0.0000 [IU] | Freq: Three times a day (TID) | SUBCUTANEOUS | Status: DC
  Administered 2014-10-02: 11 [IU] via SUBCUTANEOUS
  Administered 2014-10-03: 2 [IU] via SUBCUTANEOUS
  Administered 2014-10-03: 3 [IU] via SUBCUTANEOUS

## 2014-10-02 MED ORDER — ROSUVASTATIN CALCIUM 10 MG PO TABS
10.0000 mg | ORAL_TABLET | ORAL | Status: DC
  Administered 2014-10-02: 10 mg via ORAL
  Filled 2014-10-02: qty 1

## 2014-10-02 MED ORDER — SODIUM CHLORIDE 0.9 % IJ SOLN
3.0000 mL | Freq: Two times a day (BID) | INTRAMUSCULAR | Status: DC
  Administered 2014-10-02 – 2014-10-03 (×3): 3 mL via INTRAVENOUS

## 2014-10-02 MED ORDER — CARVEDILOL 12.5 MG PO TABS
12.5000 mg | ORAL_TABLET | Freq: Two times a day (BID) | ORAL | Status: DC
  Administered 2014-10-02 – 2014-10-03 (×3): 12.5 mg via ORAL
  Filled 2014-10-02 (×4): qty 1

## 2014-10-02 MED ORDER — SODIUM CHLORIDE 0.9 % IJ SOLN: 3.0000 mL | Freq: Two times a day (BID) | INTRAMUSCULAR | Status: DC

## 2014-10-02 MED ORDER — SODIUM CHLORIDE 0.9 % IV SOLN: 1.0000 mL/kg/h | INTRAVENOUS | Status: DC

## 2014-10-02 MED ORDER — INSULIN GLARGINE 100 UNIT/ML ~~LOC~~ SOLN: 40.0000 [IU] | Freq: Every day | SUBCUTANEOUS | Status: DC

## 2014-10-02 MED ORDER — ASPIRIN 81 MG PO CHEW: 81.0000 mg | CHEWABLE_TABLET | ORAL | Status: DC

## 2014-10-02 MED ORDER — SODIUM CHLORIDE 0.9 % IV SOLN
INTRAVENOUS | Status: DC
Start: 2014-10-02 — End: 2014-10-03

## 2014-10-02 MED ORDER — SODIUM CHLORIDE 0.9 % IJ SOLN: 3.0000 mL | INTRAMUSCULAR | Status: DC | PRN

## 2014-10-02 MED ORDER — FENOFIBRATE 160 MG PO TABS
160.0000 mg | ORAL_TABLET | Freq: Every day | ORAL | Status: DC
  Administered 2014-10-02 – 2014-10-03 (×2): 160 mg via ORAL
  Filled 2014-10-02 (×2): qty 1

## 2014-10-02 MED ORDER — INSULIN GLARGINE 100 UNIT/ML ~~LOC~~ SOLN
20.0000 [IU] | Freq: Every day | SUBCUTANEOUS | Status: AC
  Administered 2014-10-02: 20 [IU] via SUBCUTANEOUS
  Filled 2014-10-02: qty 0.2

## 2014-10-02 MED ORDER — HYDRALAZINE HCL 50 MG PO TABS
50.0000 mg | ORAL_TABLET | Freq: Two times a day (BID) | ORAL | Status: DC
  Administered 2014-10-02 (×2): 50 mg via ORAL
  Filled 2014-10-02 (×4): qty 1

## 2014-10-02 MED ORDER — SODIUM CHLORIDE 0.9 % IJ SOLN
3.0000 mL | Freq: Two times a day (BID) | INTRAMUSCULAR | Status: DC
Start: 2014-10-02 — End: 2014-10-03
  Administered 2014-10-02: 3 mL via INTRAVENOUS

## 2014-10-02 MED ORDER — SODIUM CHLORIDE 0.9 % IV SOLN: 250.0000 mL | INTRAVENOUS | Status: DC | PRN

## 2014-10-02 MED ORDER — ASPIRIN 81 MG PO CHEW
81.0000 mg | CHEWABLE_TABLET | ORAL | Status: AC
  Administered 2014-10-03: 81 mg via ORAL
  Filled 2014-10-02: qty 1

## 2014-10-02 MED ORDER — INSULIN GLARGINE 100 UNIT/ML ~~LOC~~ SOLN
70.0000 [IU] | Freq: Every day | SUBCUTANEOUS | Status: DC
Start: 2014-10-02 — End: 2014-10-03
  Administered 2014-10-02: 35 [IU] via SUBCUTANEOUS
  Filled 2014-10-02 (×2): qty 0.7

## 2014-10-02 MED ORDER — AMLODIPINE BESYLATE 10 MG PO TABS
10.0000 mg | ORAL_TABLET | Freq: Every day | ORAL | Status: DC
  Administered 2014-10-02 – 2014-10-03 (×2): 10 mg via ORAL
  Filled 2014-10-02 (×2): qty 1

## 2014-10-02 MED ORDER — SODIUM CHLORIDE 0.9 % IJ SOLN
3.0000 mL | INTRAMUSCULAR | Status: DC | PRN
Start: 2014-10-02 — End: 2014-10-02

## 2014-10-02 MED ORDER — ISOSORBIDE MONONITRATE ER 30 MG PO TB24
30.0000 mg | ORAL_TABLET | Freq: Every day | ORAL | Status: DC
  Administered 2014-10-02 – 2014-10-03 (×2): 30 mg via ORAL
  Filled 2014-10-02 (×2): qty 1

## 2014-10-02 MED ORDER — SODIUM CHLORIDE 0.9 % IV SOLN
INTRAVENOUS | Status: DC
  Administered 2014-10-02: 15:00:00 via INTRAVENOUS

## 2014-10-02 MED ORDER — PANTOPRAZOLE SODIUM 40 MG PO TBEC
40.0000 mg | DELAYED_RELEASE_TABLET | Freq: Every day | ORAL | Status: DC
  Administered 2014-10-02 – 2014-10-03 (×2): 40 mg via ORAL
  Filled 2014-10-02 (×2): qty 1

## 2014-10-02 MED ORDER — INSULIN ASPART 100 UNIT/ML ~~LOC~~ SOLN
0.0000 [IU] | Freq: Three times a day (TID) | SUBCUTANEOUS | Status: DC
  Administered 2014-10-02: 8 [IU] via SUBCUTANEOUS

## 2014-10-02 MED ORDER — NITROGLYCERIN 0.4 MG SL SUBL: 0.4000 mg | SUBLINGUAL_TABLET | SUBLINGUAL | Status: DC | PRN

## 2014-10-02 NOTE — H&P (Signed)
Cardiologist:  Lance Collier is an 69 y.o. male.   Chief Complaint: Angina   HPI: Lance Collier is a 69 y.o. male with a PMH below who presents today for a followup visit. Cardiac Histroy:  Dec 2012 - Complex RCA PCI (Dr. 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 Servando Snare)  Echo 06/29/2013: EF 60-65%, Aortic Sclerosis. No regional WMA  Graduated from [facility] in 10/2013  2-D echocardiogram ordered April 4:mild concentric LVH. EF 55-60%. Aortic sclerosis no stenosis. Moderate MAC. Mild to moderate TR. PA pressure 34 mm mercury to  Interval History:  He was seen by Dr. Peter Collier on March 31 and was complaining of worsening exertional dyspnea as well as lower extremity edema and weight gain. His diuretic dose was increased to 40 mg twice a day, he has noted some improvement of his edema but has still continued to have significant exertional dyspnea with chest tightness if he pushes beyond that. He gets short of breath simply walking from his handicap parking space into his office to  He is concerned as he is now is starting to have some discomfort in his chest as well as exertional dyspnea. Her shortness of the head prior to his first stent and prior to his CABG. Tonsil was decreased significantly. He doesn't think that this is just related to his claudication as well as occasional muscle and back in February. He has noted some mild PND and orthopnea but this is not better with the increased dose of Lasix. HEENT [place] Artist and diastolic dysfunction from his echocardiogram but the LV dysfunction is not consistent with the level of symptoms he is having to  His claudication is relatively stable -- he recently saw Dr. Oneida Alar, and the recommendation remains continued walking regimen and medical management.  He denies any headaches or blurred  vision, palpitations or irregular heartbeats. No syncope/near syncope or TIA symptoms or associated symptoms. No myalgias or arthralgias from Crestor 10 mg every other day..      Medication Sig  acetaminophen (TYLENOL) 325 MG tablet Take 650 mg by mouth as needed.  amLODipine (NORVASC) 10 MG tablet TAKE 1 TABLET ONCE DAILY.  aspirin EC 325 MG EC tablet Take 1 tablet (325 mg total) by mouth daily.  B-D ULTRAFINE III SHORT PEN 31G X 8 MM MISC See admin instructions.  carvedilol (COREG) 12.5 MG tablet Take 1 tablet (12.5 mg total) by mouth 2 (two) times daily.  fenofibrate 160 MG tablet Take 160 mg by mouth daily.  furosemide (LASIX) 40 MG tablet Take 1 tablet (40 mg total) by mouth 2 (two) times daily.  HUMALOG KWIKPEN 100 UNIT/ML SOPN Inject 10-15 Units into the skin 3 (three) times daily. Sliding scale. 10 units with breakfast and 15 units with lunch and supper  hydrALAZINE (APRESOLINE) 50 MG tablet Take 1 tablet (50 mg total) by mouth 2 (two) times daily.  insulin glargine (LANTUS) 100 UNIT/ML injection Inject 40-70 Units into the skin 2 (two) times daily. 40 units in am, 70 units in pm  isosorbide mononitrate (IMDUR) 30 MG 24 hr tablet Take 1 tablet (30 mg total) by mouth daily.  nitroGLYCERIN (NITROSTAT) 0.4 MG SL tablet Place 1 tablet (0.4 mg total) under the tongue every 5 (five) minutes as needed for chest pain.  pantoprazole (PROTONIX) 40 MG tablet Take 40 mg by mouth  daily.  rosuvastatin (CRESTOR) 10 MG tablet Take 1 tablet (10 mg total) by mouth every other day.     Past Medical History  Diagnosis Date  . DM (diabetes mellitus) type II uncontrolled with eye manifestation 2013    Diabetic retinopathy with retinal hemorrhages since;, recurrent in August 2014 treated with Avastin shots  . Hypertension   . CAD S/P percutaneous coronary angioplasty 06/12/2011    status post complex PCI to the RCA requiring the use of GuideLiner catheter.  2  . Presence of drug coated stent in right  coronary artery 06/12/2011    Subtotal proximal RCA (tortuous) --> complex PCI requiring Guideliner: 2 overlapping Promus element DES stents.  . 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  Likely thromboembolic event with PDA occlusion. 06/12/2011    Very difficult, complex procedure requiring multiple guidewires, guide-liner, etc.  Angiographic evidence of distal thrombo / anthero-embolism as likely etiology of Troponin of ~1.58. Pt remains asymptomatic.   . 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 Aortic sclerosis  - without Stenosis  March 2014    Echo: Aortic sclerosis without stenosis. EF 55-60% with normal WM; mild concentric hypertrophy with normal relaxation.  Marland Kitchen PAD (peripheral artery disease)     with claudication  . High cholesterol   . Migraine ~ 2004    "once"  . Anemia   . 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 GERD (gastroesophageal reflux disease)   . Anxiety     Past Surgical History  Procedure Laterality Date  . Tonsillectomy    . Nm myoview ltd  January '13    Diaphragmatic attenuation versus inferior infarct. No ischemia. Normal EF normal wall motion.  Marland Kitchen Nm myoview ltd  June 2014    no ischemia  . Coronary angioplasty with stent placement Right September 2012    2 overlapping Promus DES 2.7 mm at 12 mm x2; postdilated to 3 mm  . Cardiac catheterization  06/28/2013    ostial LM disese, RCA ~40-50%ISR  . Retinal laser procedure Bilateral     "more than once"   . Coronary artery bypass graft N/A 07/01/2013    Procedure: CORONARY ARTERY BYPASS GRAFTING (CABG);  Surgeon: Grace Isaac, MD;  Location: [facility];  Service: Open Heart Surgery;  Laterality: N/A;  Times 3 using left internal mammary artery and endoscopically harvested bilateral saphenous vein  . Intraoperative transesophageal echocardiogram N/A 07/01/2013    Procedure: INTRAOPERATIVE TRANSESOPHAGEAL  ECHOCARDIOGRAM;  Surgeon: Grace Isaac, MD;  Location: [facility];  Service: Open Heart Surgery;  Laterality: N/A;  . Transthoracic echocardiogram  06/29/2013    Focal basal hypertrophy with normal LV size. EF 60-65% no regional WMA. Mild LA dilation  . Left heart catheterization with coronary angiogram N/A 06/11/2011    Procedure: LEFT HEART CATHETERIZATION WITH CORONARY ANGIOGRAM;  Surgeon: Fulton Reek, MD;  [facility] C[facility] LAB;  Service: Cardiovascular;  Laterality: N/A;  . Percutaneous coronary stent intervention (pci-s)  06/11/2011    Procedure: PERCUTANEOUS CORONARY STENT INTERVENTION (PCI-S);  Surgeon: Fulton Reek, MD;  Location: [facility] CATH LAB;  Service: Cardiovascular;;  . Left heart catheterization with coronary angiogram N/A 06/28/2013    Procedure: LEFT HEART CATHETERIZATION WITH CORONARY ANGIOGRAM;  Surgeon: Leonie Man, MD;  Location: [facility] CATH LAB;  Service: Cardiovascular;  Laterality: N/A;    Family History  Problem Relation  Age of Onset  . Cancer Sister     Lung cancer  . Cancer Mother   . Cancer Father   . Hyperlipidemia Father   . Hypertension Father   . Cancer Maternal Grandmother   . Cancer Paternal Grandfather    Social History:  reports that he quit smoking about 17 years ago. His smoking use included Cigarettes. He has a 54 pack-year smoking history. He has never used smokeless tobacco. He reports that he drinks alcohol. He reports that he does not use illicit drugs.  Allergies:  Allergies  Allergen Reactions  . Atorvastatin Other (See Comments)    Leg pain  . Ibuprofen Hives  . Pravastatin   . Simvastatin Other (See Comments)    Leg pain    Medications Prior to Admission  Medication Sig Dispense Refill  . acetaminophen (TYLENOL) 325 MG tablet Take 650 mg by mouth as needed.    Marland Kitchen amLODipine (NORVASC) 10 MG tablet TAKE 1 TABLET ONCE DAILY. 30 tablet 9  . aspirin EC 325 MG EC tablet Take 1 tablet (325 mg total) by mouth daily. 30 tablet 0  . B-D  ULTRAFINE III SHORT PEN 31G X 8 MM MISC See admin instructions.  0  . carvedilol (COREG) 12.5 MG tablet Take 1 tablet (12.5 mg total) by mouth 2 (two) times daily. 60 tablet 11  . fenofibrate 160 MG tablet Take 160 mg by mouth daily.    . furosemide (LASIX) 40 MG tablet Take 1 tablet (40 mg total) by mouth 2 (two) times daily. 90 tablet 3  . HUMALOG KWIKPEN 100 UNIT/ML SOPN Inject 10-15 Units into the skin 3 (three) times daily. Sliding scale. 10 units with breakfast and 15 units with lunch and supper    . hydrALAZINE (APRESOLINE) 50 MG tablet Take 1 tablet (50 mg total) by mouth 2 (two) times daily. 60 tablet 6  . insulin glargine (LANTUS) 100 UNIT/ML injection Inject 40-70 Units into the skin 2 (two) times daily. 40 units in am, 70 units in pm    . isosorbide mononitrate (IMDUR) 30 MG 24 hr tablet Take 1 tablet (30 mg total) by mouth daily. 30 tablet 6  . nitroGLYCERIN (NITROSTAT) 0.4 MG SL tablet Place 1 tablet (0.4 mg total) under the tongue every 5 (five) minutes as needed for chest pain. 90 tablet 3  . pantoprazole (PROTONIX) 40 MG tablet Take 40 mg by mouth daily.    . rosuvastatin (CRESTOR) 10 MG tablet Take 1 tablet (10 mg total) by mouth every other day. 15 tablet 11    No results found for this or any previous visit (from the past 48 hour(s)). No results found.  Review of Systems  Constitutional: Negative for fever and diaphoresis.  HENT: Negative for congestion.   Respiratory: Positive for shortness of breath (with exertion).   Cardiovascular: Positive for chest pain and leg swelling. Negative for orthopnea and PND.  Gastrointestinal: Negative for nausea, vomiting, abdominal pain, blood in stool and melena.  Neurological: Negative for dizziness.  All other systems reviewed and are negative.   Blood pressure 155/56, pulse 58, temperature 97.7 F (36.5 C), temperature source Oral, resp. rate 18, height 5\' 8"  (1.727 m), weight 212 lb 8.4 oz (96.4 kg), SpO2 97 %. Physical Exam    Nursing note and vitals reviewed. Constitutional: He is oriented to person, place, and time. He appears well-developed. No distress.  obese  HENT:  Head: Normocephalic and atraumatic.  Eyes: EOM are normal. Pupils are equal, round, and  reactive to light.  Neck: Normal range of motion. Neck supple.  Cardiovascular: Normal rate and regular rhythm.   Murmur heard.  Systolic murmur is present with a grade of 1/6  Pulses:      Radial pulses are 2+ on the right side, and 2+ on the left side.  Respiratory: Effort normal and breath sounds normal. He has no wheezes. He has no rales.  GI: Bowel sounds are normal. He exhibits distension. There is no tenderness.  Abdomen is tense.  Musculoskeletal: He exhibits edema.  1+ LEE   Lymphadenopathy:    He has no cervical adenopathy.  Neurological: He is alert and oriented to person, place, and time. He exhibits normal muscle tone.  Skin: Skin is warm and dry.  Psychiatric: He has a normal mood and affect.     Assessment/Plan Principal Problem:   Angina, class III Active Problems:   Diabetes mellitus   Essential hypertension   Hyperlipidemia with target LDL less than 70; intolerance to atorvastatin and simvastatin   Chronic kidney disease (CKD), stage II (mild)   Obesity (BMI 30-39.9)   S/P CABG (coronary artery bypass graft)  x 3, 07/01/13, LIMA-LAD; VG-RCA; VG- LCX  Plan:  Patient admitted for IV hydration prior to left and right heart caths on Tuesday, April 12.  Hold lasix today and tomorrow.  Strict I/O's.  Continue Lantus and add SS insulin.  BP elevated.  Continue home meds: amlodipine 10, coreg 12.5, hydralazine 50bid, imdur 30.  Tarri Fuller, [facility] 10/02/2014, 12:11 PM   Attending Note:   The patient was seen and examined.  Agree with assessment and plan as noted above.  Changes made to the above note as needed.  Patient is stable. Here for hydration prior to cath tomorrow. Will start IV fluids tonight at 5 PM  Adjust insulin for  cath . NPO after MN   Ramond Dial., MD, [facility] 10/02/2014, 2:19 PM [id number] [address]  [place] Pager [phone]

## 2014-10-02 NOTE — Progress Notes (Signed)
MD paged x2; MD returns call back and informed RN to page Larena Glassman [facility]) to have one of his PA to come see pt. Larena Glassman paged and notified. Awaiting on orders for pt. Will continue to monitor quietly. Francis Gaines Shakeerah Gradel RN.

## 2014-10-02 NOTE — Telephone Encounter (Signed)
-----   Message from Raiford Simmonds, RN sent at 08/01/2014 12:44 PM EST ----- Labs - lipid and cmp in 09/2014 Need to mail  letter

## 2014-10-02 NOTE — Progress Notes (Signed)
Pt A&O x4; pt arrived to the unit via wheelchair with belongings to the side as a direct admit from home. VSS; denies any pain; telemetry applied and verified; no orders, MD paged and awaiting on return call back for orders. IV access established; pt sitting up in chair with call light within reach. Will continue to monitor quietly. Francis Gaines Chanson Teems RN.

## 2014-10-02 NOTE — Telephone Encounter (Signed)
LATE ENTRY PATIENT RECEIVED LAB SLIP ON 09/22/14 AT OFFICE VISIT WITH DR [name]

## 2014-10-03 ENCOUNTER — Encounter (HOSPITAL_COMMUNITY): Payer: Self-pay | Admitting: Cardiology

## 2014-10-03 ENCOUNTER — Other Ambulatory Visit: Payer: Self-pay | Admitting: Physician Assistant

## 2014-10-03 ENCOUNTER — Encounter (HOSPITAL_COMMUNITY)
Admission: RE | Disposition: A | Discharge status: Home / Self Care | Payer: Self-pay | Source: Ambulatory Visit | Attending: Cardiology

## 2014-10-03 DIAGNOSIS — I25119 Atherosclerotic heart disease of native coronary artery with unspecified angina pectoris: Secondary | ICD-10-CM | POA: Diagnosis not present

## 2014-10-03 DIAGNOSIS — N184 Chronic kidney disease, stage 4 (severe): Secondary | ICD-10-CM

## 2014-10-03 DIAGNOSIS — I129 Hypertensive chronic kidney disease with stage 1 through stage 4 chronic kidney disease, or unspecified chronic kidney disease: Secondary | ICD-10-CM | POA: Diagnosis not present

## 2014-10-03 DIAGNOSIS — I25729 Atherosclerosis of autologous artery coronary artery bypass graft(s) with unspecified angina pectoris: Secondary | ICD-10-CM | POA: Diagnosis not present

## 2014-10-03 DIAGNOSIS — I25110 Atherosclerotic heart disease of native coronary artery with unstable angina pectoris: Secondary | ICD-10-CM

## 2014-10-03 DIAGNOSIS — T82858A Stenosis of vascular prosthetic devices, implants and grafts, initial encounter: Secondary | ICD-10-CM | POA: Diagnosis not present

## 2014-10-03 DIAGNOSIS — E11319 Type 2 diabetes mellitus with unspecified diabetic retinopathy without macular edema: Secondary | ICD-10-CM | POA: Diagnosis not present

## 2014-10-03 DIAGNOSIS — N182 Chronic kidney disease, stage 2 (mild): Secondary | ICD-10-CM | POA: Diagnosis not present

## 2014-10-03 HISTORY — PX: LEFT AND RIGHT HEART CATHETERIZATION WITH CORONARY/GRAFT ANGIOGRAM: SHX5448

## 2014-10-03 LAB — BASIC METABOLIC PANEL
Anion gap: 10 (ref 5–15)
BUN: 39 mg/dL — ABNORMAL HIGH (ref 6–23)
CO2: 28 mmol/L (ref 19–32)
Calcium: 9.1 mg/dL (ref 8.4–10.5)
Chloride: 100 mmol/L (ref 96–112)
Creatinine, Ser: 2.15 mg/dL — ABNORMAL HIGH (ref 0.50–1.35)
GFR, EST AFRICAN AMERICAN: 34 mL/min — AB (ref >90)
GFR, EST NON AFRICAN AMERICAN: 29 mL/min — AB (ref >90)
Glucose, Bld: 199 mg/dL — ABNORMAL HIGH (ref 70–99)
POTASSIUM: 4 mmol/L (ref 3.5–5.1)
SODIUM: 138 mmol/L (ref 135–145)

## 2014-10-03 LAB — GLUCOSE, CAPILLARY
GLUCOSE-CAPILLARY: 160 mg/dL — AB (ref 70–99)
GLUCOSE-CAPILLARY: 190 mg/dL — AB (ref 70–99)
Glucose-Capillary: 201 mg/dL — ABNORMAL HIGH (ref 70–99)
Glucose-Capillary: 145 mg/dL — ABNORMAL HIGH (ref 70–99)

## 2014-10-03 LAB — POCT I-STAT 3, ART BLOOD GAS (G3+)
ACID-BASE EXCESS: 1 mmol/L (ref 0.0–2.0)
BICARBONATE: 26 meq/L — AB (ref 20.0–24.0)
O2 Saturation: 97 %
PH ART: 7.401 (ref 7.350–7.450)
TCO2: 27 mmol/L (ref 0–100)
pCO2 arterial: 42 mmHg (ref 35.0–45.0)
pO2, Arterial: 91 mmHg (ref 80.0–100.0)

## 2014-10-03 LAB — POCT I-STAT 3, VENOUS BLOOD GAS (G3P V)
ACID-BASE EXCESS: 2 mmol/L (ref 0.0–2.0)
Bicarbonate: 28.3 mEq/L — ABNORMAL HIGH (ref 20.0–24.0)
O2 Saturation: 70 %
PCO2 VEN: 51.2 mmHg — AB (ref 45.0–50.0)
PO2 VEN: 39 mmHg (ref 30.0–45.0)
TCO2: 30 mmol/L (ref 0–100)
pH, Ven: 7.351 — ABNORMAL HIGH (ref 7.250–7.300)

## 2014-10-03 LAB — PROTIME-INR
INR: 0.98 (ref 0.00–1.49)
Prothrombin Time: 13.1 seconds (ref 11.6–15.2)

## 2014-10-03 LAB — MRSA PCR SCREENING: MRSA BY PCR: NEGATIVE

## 2014-10-03 SURGERY — LEFT AND RIGHT HEART CATHETERIZATION WITH CORONARY/GRAFT ANGIOGRAM
Anesthesia: LOCAL

## 2014-10-03 MED ORDER — ISOSORBIDE MONONITRATE ER 60 MG PO TB24: 60.0000 mg | ORAL_TABLET | Freq: Every day | ORAL | Status: DC

## 2014-10-03 MED ORDER — ACETAMINOPHEN 325 MG PO TABS
650.0000 mg | ORAL_TABLET | ORAL | Status: DC | PRN
  Administered 2014-10-03: 650 mg via ORAL
  Filled 2014-10-03: qty 2

## 2014-10-03 MED ORDER — HEPARIN (PORCINE) IN NACL 2-0.9 UNIT/ML-% IJ SOLN
INTRAMUSCULAR | Status: AC
  Filled 2014-10-03: qty 1000

## 2014-10-03 MED ORDER — FENTANYL CITRATE 0.05 MG/ML IJ SOLN
INTRAMUSCULAR | Status: AC
  Filled 2014-10-03: qty 2

## 2014-10-03 MED ORDER — LIDOCAINE HCL (PF) 1 % IJ SOLN
INTRAMUSCULAR | Status: AC
  Filled 2014-10-03: qty 30

## 2014-10-03 MED ORDER — ONDANSETRON HCL 4 MG/2ML IJ SOLN: 4.0000 mg | Freq: Four times a day (QID) | INTRAMUSCULAR | Status: DC | PRN

## 2014-10-03 MED ORDER — HYDRALAZINE HCL 100 MG PO TABS: 100.0000 mg | ORAL_TABLET | Freq: Two times a day (BID) | ORAL | Status: DC

## 2014-10-03 MED ORDER — SODIUM CHLORIDE 0.9 % IJ SOLN: 3.0000 mL | Freq: Two times a day (BID) | INTRAMUSCULAR | Status: DC

## 2014-10-03 MED ORDER — HYDRALAZINE HCL 50 MG PO TABS
100.0000 mg | ORAL_TABLET | Freq: Two times a day (BID) | ORAL | Status: DC
  Filled 2014-10-03: qty 2

## 2014-10-03 MED ORDER — FUROSEMIDE 20 MG PO TABS: 20.0000 mg | ORAL_TABLET | Freq: Every day | ORAL | Status: DC

## 2014-10-03 MED ORDER — SODIUM CHLORIDE 0.9 % IJ SOLN: 3.0000 mL | INTRAMUSCULAR | Status: DC | PRN

## 2014-10-03 MED ORDER — HYDRALAZINE HCL 20 MG/ML IJ SOLN
INTRAMUSCULAR | Status: AC
  Filled 2014-10-03: qty 1

## 2014-10-03 MED ORDER — CLOPIDOGREL BISULFATE 75 MG PO TABS: 75.0000 mg | ORAL_TABLET | Freq: Every day | ORAL | Status: DC

## 2014-10-03 MED ORDER — SODIUM CHLORIDE 0.9 % IV SOLN
250.0000 mL | INTRAVENOUS | Status: DC | PRN
Start: 2014-10-03 — End: 2014-10-03

## 2014-10-03 MED ORDER — NITROGLYCERIN 1 MG/10 ML FOR IR/CATH LAB
INTRA_ARTERIAL | Status: AC
  Filled 2014-10-03: qty 10

## 2014-10-03 MED ORDER — MIDAZOLAM HCL 2 MG/2ML IJ SOLN
INTRAMUSCULAR | Status: AC
  Filled 2014-10-03: qty 2

## 2014-10-03 MED ORDER — CLOPIDOGREL BISULFATE 75 MG PO TABS
75.0000 mg | ORAL_TABLET | Freq: Every day | ORAL | Status: DC
  Filled 2014-10-03: qty 1

## 2014-10-03 MED ORDER — SODIUM CHLORIDE 0.9 % IV SOLN
1.0000 mL/kg/h | INTRAVENOUS | Status: AC
  Administered 2014-10-03: 1 mL/kg/h via INTRAVENOUS

## 2014-10-03 NOTE — Discharge Summary (Signed)
CARDIOLOGY DISCHARGE SUMMARY   Patient ID: Lance Collier MRN: [id number] DOB/AGE: 30-Mar-1946 69 y.o.  Admit date: 10/02/2014 Discharge date: 10/03/2014  PCP: Lance Stack, MD Primary Cardiologist: Dr Lance Collier  Primary Discharge Diagnosis:  Angina, class III Secondary Discharge Diagnosis:    Diabetes mellitus   Essential hypertension   Hyperlipidemia with target LDL less than 70; intolerance to atorvastatin and simvastatin   Chronic kidney disease (CKD), stage II (mild)   Obesity (BMI 30-39.9)   S/P CABG (coronary artery bypass graft)  x 3, 07/01/13, LIMA-LAD; VG-RCA; VG- LCX   CAD S/P percutaneous coronary angioplasty   Anemia, since his bypass surgery  Procedures: LEFT AND [place] Hospital Course: Lance Collier is a 69 y.o. male with a history of CAD and CABG in 2015. He was seen by Dr. Acie Collier on 04/11, and was complaining of exertional dyspnea as well as chest discomfort with exertion. Cardiac catheterization was recommended to further define his anatomy and he came to the hospital for this on 10/03/2014.  Cardiac catheterization results are below. He has approximately 80% stenosis of the SVG-RCA as well as approximately 60% hazy in-stent restenosis in the native RCA. His other grafts are patent. He had severe systemic hypertension with a wedge of 28 and LVEDP 22. IV hydralazine was used during the case for improved blood pressure control.  Dr. Ellyn Collier reviewed the films and felt that he should get a BMET weekly for the next 2 weeks, increase his hydralazine, increase his Imdur, continue his Lasix, and plan staged PCI to the SVG-RCA on 04/21. He is to get 6 hours of IV hydration today and then be discharged home, to follow up as an outpatient.  Labs:   Lab Results  Component Value Date   WBC 5.5 10/02/2014   HGB 10.9* 10/02/2014   HCT 34.2* 10/02/2014   MCV 85.1 10/02/2014   PLT 263 10/02/2014     Recent  Labs Lab 10/03/14 0508  NA 138  K 4.0  CL 100  CO2 28  BUN 39*  CREATININE 2.15*  CALCIUM 9.1  GLUCOSE 199*    Recent Labs  10/03/14 0508  INR 0.98   Cardiac Cath: 10/03/2014 PRE-OPERATIVE DIAGNOSIS: sob with activity; Class III Angina POST-OPERATIVE DIAGNOSIS:   Severe proximal ~80% stenosis of SVG-RCA as culprit lesion (distal RCA with competetive flow & ~60% hazy ISR in native RCA  Patent LIMA-LAD & SVG-OM1/RI with retrograde filling OM2  Known native LM disease with moderate OM2 disease.  Normal CO/CI by Fick & TD  Moderate to severely elevated LVEDP/PCWP (EDP ~22 mmHg, PCWP 28 mmHg) - with severe systemic HTN PROCEDURE: Procedure(s): LEFT AND RIGHT HEART CATHETERIZATION WITH CORONARY/GRAFT ANGIOGRAM (N/A)   RFA - 5 Fr, JL4 (LCA) & JR4(RCA, SVG-OM, SVG-RCA, LIMA0-LAD) -> angled pigtail catheters over wire  RFV - 7 Fr --> [place] Catheter for RA,RV, PA & PCWP,CO measurement (used wire for manipulation into PA). PLAN OF CARE: IVF hydration x 6 hr post cath; Plan staged PCI to SVG-RCA on Thurs 4/21 (early AM admission & PM case to allow hydration)   D/c today after hydration & bedrest.  BMP on Friday 4/15 & Wed 4/20.  Increase Hydralazine to 100 mg BID; return to previous standing Lasix 20 mg daily & Increase Imdur to 60 mg  FOLLOW UP PLANS AND APPOINTMENTS Allergies  Allergen Reactions  . Atorvastatin Other (See Comments)    Leg pain  . Ibuprofen Hives  .  Pravastatin   . Simvastatin Other (See Comments)    Leg pain     Medication List    TAKE these medications        acetaminophen 325 MG tablet  Commonly known as:  TYLENOL  Take 650 mg by mouth as needed.     amLODipine 10 MG tablet  Commonly known as:  NORVASC  TAKE 1 TABLET ONCE DAILY.     aspirin 325 MG EC tablet  Take 1 tablet (325 mg total) by mouth daily.     B-D ULTRAFINE III SHORT PEN 31G X 8 MM Misc  Generic drug:  Insulin Pen Needle  See admin instructions.     carvedilol  12.5 MG tablet  Commonly known as:  COREG  Take 1 tablet (12.5 mg total) by mouth 2 (two) times daily.     clopidogrel 75 MG tablet  Commonly known as:  PLAVIX  Take 1 tablet (75 mg total) by mouth daily with breakfast.  Start taking on:  10/04/2014     fenofibrate 160 MG tablet  Take 160 mg by mouth daily.     furosemide 20 MG tablet  Commonly known as:  LASIX  Take 1 tablet (20 mg total) by mouth daily.     HUMALOG KWIKPEN 100 UNIT/ML KiwkPen  Generic drug:  insulin lispro  Inject 10-15 Units into the skin 3 (three) times daily. Sliding scale. 10 units with breakfast and 15 units with lunch and supper     hydrALAZINE 100 MG tablet  Commonly known as:  APRESOLINE  Take 1 tablet (100 mg total) by mouth 2 (two) times daily.     insulin glargine 100 UNIT/ML injection  Commonly known as:  LANTUS  Inject 40-70 Units into the skin 2 (two) times daily. 40 units in am, 70 units in pm     isosorbide mononitrate 60 MG 24 hr tablet  Commonly known as:  IMDUR  Take 1 tablet (60 mg total) by mouth daily.     nitroGLYCERIN 0.4 MG SL tablet  Commonly known as:  NITROSTAT  Place 1 tablet (0.4 mg total) under the tongue every 5 (five) minutes as needed for chest pain.     pantoprazole 40 MG tablet  Commonly known as:  PROTONIX  Take 40 mg by mouth daily.     rosuvastatin 10 MG tablet  Commonly known as:  CRESTOR  Take 1 tablet (10 mg total) by mouth every other day.        Discharge Instructions    (HEART FAILURE PATIENTS) Call MD:  Anytime you have any of the following symptoms: 1) 3 pound weight gain in 24 hours or 5 pounds in 1 week 2) shortness of breath, with or without a dry hacking cough 3) swelling in the hands, feet or stomach 4) if you have to sleep on extra pillows at night in order to breathe.    Complete by:  As directed      Diet - low sodium heart healthy    Complete by:  As directed      Diet Carb Modified    Complete by:  As directed      Increase activity slowly     Complete by:  As directed           Follow-up Information    Follow up with Lance Collier, Lance Cheng, MD.   Specialty:  Cardiology   Why:  The office will call. Lab work on 04/15 and 04/20, planned intervention on 04/21  at [facility].   Contact information:   [place] [address] [address] [place] [address] [phone]       BRING ALL MEDICATIONS WITH YOU TO FOLLOW UP APPOINTMENTS  Time spent with patient to include physician time: 38 min Signed: Rosaria Ferries, PA-C 10/03/2014, 3:48 PM Co-Sign MD  Attending Note:   The patient was seen and examined.  Agree with assessment and plan as noted above.  Changes made to the above note as needed.  Pt was found to have a significant stenosis in the SVG to RCA.  willl be discharged and admitted after allowing the contrast to wash out in order to minimize contrast induced nephropathy. Pt is stable to go home.   Thayer Headings, Brooke Bonito., MD, [facility] 10/04/2014, 6:10 AM [address],  [place] Pager [phone]

## 2014-10-03 NOTE — Progress Notes (Signed)
Pt cath site noted to be bleeding at 1235. Pressure applied to site; level 0 with no bruising or hematoma noted. Pressure was held for 67mins until a staff from cath lab came to assist. Site was clean; new sterile dsg applied by cath lab staff and it remains clean, dry and intact at level 0. Pt educated on keeping leg flat in bed for 4hours which he voices understanding. Will continue to monitor quietly. Francis Gaines Dannisha Eckmann RN.

## 2014-10-03 NOTE — Progress Notes (Signed)
Pt discharge education and instructions completed; pt voices understanding and denies any questions. Pt remains on telemetry with IV intact and infusing till ordered time per MD. Reported off to incoming RN; RN to remove telemetry and IV upon completion of pt infusion and to transport pt off unit. Pt discharge home with wife to transport him home. Francis Gaines Yudith Norlander RN.

## 2014-10-03 NOTE — Interval H&P Note (Signed)
History and Physical Interval Note:  10/03/2014 8:16 AM  Lance Collier  has presented today for surgery, with the diagnosis of sob & CP with activity - Concerning fro Class III Angina The various methods of treatment have been discussed with the patient and family. After consideration of risks, benefits and other options for treatment, the patient has consented to  Procedure(s): LEFT AND RIGHT HEART CATHETERIZATION WITH CORONARY/GRAFT ANGIOGRAM (N/A) as a surgical intervention .  The patient's history has been reviewed, patient examined, no change in status, stable for surgery.  I have reviewed the patient's chart and labs.  Questions were answered to the patient's satisfaction.    He has been hydrated overnight with noted improvement of Cr (initial Cr was 2.54 -> likely related to aggressive diuresis).  Plan is Dx LHC/Cors/Grafts & consider staged PCI if indicated.  [place], [place]  Cath Lab Visit (complete for each Cath Lab visit)  Clinical Evaluation Leading to the Procedure:   ACS: No.  Non-ACS:    Anginal Classification: CCS III  Anti-ischemic medical therapy: Maximal Therapy (2 or more classes of medications)  Non-Invasive Test Results: No non-invasive testing performed  Prior CABG: Previous CABG  SCAI AUC -= Non-rated.

## 2014-10-03 NOTE — Progress Notes (Signed)
Site area: rt groin fa and fv sheaths Site Prior to Removal:  Level 0 Pressure Applied For:  20 minutes Manual:   yes Patient Status During Pull:  stable Post Pull Site:  Level  0 Post Pull Instructions Given:  yes Post Pull Pulses Present: yes Dressing Applied:  tegaderm Bedrest begins @  [id number] Comments:  0 complications

## 2014-10-03 NOTE — Progress Notes (Signed)
DISCHARGE PAPERWORK COMPLETED AND REVIEWED WITH PATIENT BY DAY SHIFT RN.  IV'S AND TELE REMOVED. PATIENT DRESSED AND STANDY-ASSISTED TO WHEELCHAIR. LEAVING TELE UNIT AT THIS TIME WITH NURSE TECH. DISCHARGED HOME. WIFE WAITING FOR PATIENT AT ENTRANCE.

## 2014-10-03 NOTE — CV Procedure (Signed)
CARDIAC CATHETERIZATION REPORT  NAME:  Lance Collier   MRN: [id number] DOB:  Nov 08, 1945   ADMIT DATE: 10/02/2014 Procedure Date: 10/03/2014  INTERVENTIONAL CARDIOLOGIST: Leonie Man, M.D., MS PRIMARY CARE PROVIDER: Sheela Stack, MD PRIMARY CARDIOLOGIST: Leonie Man, MD  PATIENT:  Lance Collier is a 69 y.o. male who is now roughly one year status post CABG presenting with worsening symptoms of exertional dyspnea and chest pain concerning for class III angina. He is referred for invasive evaluation with cardiac catheterization right and left due to his progressively worsening dyspnea and edema.  Due to chronic renal insufficiency, he was admitted overnight for IV hydration. Function is improved from creatinine 2. 542.15.  PRE-OPERATIVE DIAGNOSIS:    Class III Angina  Progressively worsening dyspnea concern for elevated pulmonary pressures - evaluation of adequacy of diuresis  PROCEDURES PERFORMED:    Right & Left Heart Catheterization with Native Coronary And Graft Angiography  via Right Common Femoral Artery &  Vein Access.  PROCEDURE: The patient was brought to the [facility] in the fasting state and prepped and draped in the usual sterile fashion for right femoral arterial and venous access. Sterile technique was used including antiseptics, cap, gloves, gown, hand hygiene, mask and sheet. Skin prep: Chlorhexidine.   Consent: Risks of procedure as well as the alternatives and risks of each were explained to the (patient/caregiver). Consent for procedure obtained.   Time Out: Verified patient identification, verified procedure, site/side was marked, verified correct patient position, special equipment/implants available, medications/allergies/relevent history reviewed, required imaging and test results available. Performed.  Access:  Right Common Femoral Artery: 5 Fr Sheath - fluoroscopically guided modified Seldinger Technique    Right Common Femoral Vein: 7 Fr Sheath - Seldinger Technique..  Right Heart Catheterization: 7 Fr Gordy Councilman catheter advanced under fluoroscopy with balloon inflated to the RA, RV, then PCWP-PA for hemodynamic measurement.  Simultaneous FA & PA blood gases checked for SaO2% to calculate FICK CO/CI  Thermodilution Injections performed to calculate CO/CI  Simultaneous PCWP/LV & RV/LV pressures monitored with Angled Pigtail in LV.  Catheter removed completely out of the body with balloon deflated.  Left Heart Catheterization: 5Fr Catheters advanced or exchanged over a J-wirE, under fluoroscopic guidance into the ascending aorta.; JL4 catheter advanced first.  LV Hemodynamics (LV Gram): JR4 Left Coronary Artery Cineangiography: JL4 Catheter  Right Coronary Artery, SVG-RCA & SVG-OM Cineangiography: JR4 Catheter  LIMA-LAD Cineangiography: JR4 Catheter redirected into Left Subclavian Artery and advanced over the J-wire..  Sheath removed in the holding area with manual pressure for hemostasis.   EBL: < 10 ml, not including ABG and VBG samples   FINDINGS:  Hemodynamics:   SaO2%  Pressures mmHg  Mean P  mmHg  EDP  mmHg   Right Atrium   12/9  7  Right Ventricle   34/1  9   Pulmonary Artery  70 30/6  17   PCWP   24/23 22    Central Aortic  97 169/66 103   Left Ventricle   170/17  30         Cardiac Output:   Cardiac Index:    Fick  6.95  3.33   Thermodilution  6.62  3.17    Left Ventriculography: deferred   Coronary Anatomy:  Left Main: Ostial ~50% stenosis mild blowback into Aorta. LAD: Moderate large-caliber vessel extends around the apex the heart. There is mild luminal irregularities throughout with a 20% mid lesion that looks  to be intramyocardial. Mild better flow noted distally.  D1: Small-caliber vessel with ostial 60% stenosis.  D2: Small-caliber vessel, angiographically normal Left Circumflex: Large-caliber vessel that gives off a very proximal Ramus-like OM1 that is  somewhat tortuous in its course. The circumflex complex and continued down to the AV groove where it gives off a very small marginal branch before terminating as a moderate large-caliber OM 2 then bifurcates distally. Is a very small AV groove circumflex comes off at at the same site as a smaller marginal branch.   OM1: Large-caliber ramus-like vessel that reaches down to the inferior apex. It bifurcates distally with minimal luminal irregularities proximally.  There is competitive flow from a widely patent vein graft.  OM 2: Moderate caliber vessel which bifurcates into 2 small-moderate caliber marginal branches. There is roughly 40% lesion in the proximal portion of this branch.   RCA: Moderate caliber tortuous vessel with patent stents in the proximal and mid segment. There remains a slight hazy opacity in the second bend within the stent. There is otherwise diffuse luminal irregularities with a roughly 40-50% stenosis before bifurcates into the PDA and the Right Posterior AV Groove Branch (RPAV).  The native flow proceeds posterolateral system with minimal flow down the native PDA.  RPDA: Competitive flow noted in the PDA, flow down the native vessel.   Graft Anatomy:   LIMA-LAD: Widely patent to the mid LAD with brisk flow distally. The distal LAD is free of significant disease.  SVG-OM1:  widely patent to the mid OM1 with brisk flow distally noted competitive flow. Also retrograde filling into the OM 2 branch.  SVG-RPDA: There is diffuse proximal irregularities with a focal 80% stenosis in the proximal vessel before the graft then normalizes into a relatively normal caliber vessel that has no further disease as it reaches down to the PDA. No significant disease in the PDA.  After reviewing the initial angiography, the culprit lesion was thought to 80% proximal SVG-RCA. Plan will be to proceed with staged PCI later date. The patient be hydrated post cath.  MEDICATIONS:  Anesthesia:   Local Lidocaine 16 ml  Sedation:  2 mg IV Versed, 50 mcg IV fentanyl ;   Omnipaque Contrast: 70 ml   IV Hydralazine 20 mg 1   PATIENT DISPOSITION:    The patient was transferred to the PACU holding area in a hemodynamicaly stable, chest pain free condition.  The patient tolerated the procedure well, and there were no complications.  EBL:   < 10 ml  The patient was stable before, during, and after the procedure.  POST-OPERATIVE DIAGNOSIS:    Severe proximal ~80% stenosis of SVG-RCA as culprit lesion (distal RCA with competetive flow & ~60% hazy ISR in native RCA  Patent LIMA-LAD & SVG-OM1/RI with retrograde filling OM2  Known native LM disease with moderate OM2 disease.  Normal CO/CI by Fick & TD  Moderate to severely elevated LVEDP/PCWP (EDP ~22 mmHg, PCWP 28 mmHg) - with severe systemic HTN  PLAN OF CARE:  IVF hydration x 6 hr post cath; Plan staged PCI to SVG-RCA on Thurs 4/21 (early AM admission & PM case to allow hydration)   D/c today after hydration & bedrest.  BMP on Friday 4/15 & Wed 4/20.  Increase Hydralazine to 100 mg BID; return to previous standing Lasix 20 mg daily & Increase Imdur to 60 mg  Load with Plavix 300 mg today and start 75 mg daily starting tomorrow.   Leonie Man, M.D., M.S. Larence Penning  [facility] [place] [place], [place]  [address]  [phone]  10/03/2014 10:44 AM

## 2014-10-03 NOTE — Progress Notes (Signed)
Pt arrived from cath lab via bed; A&O x4; VSS; right groin site level 0 with clean, dry and intact sterile dsg intact. No active bleeding; bruising or hematoma noted. Telemetry reapplied and verified. Pt educated on bedrest and voices the understanding of laying flat in bed for 4hours till 1530. Will continue monitor pt quietly. Francis Gaines Solomon Skowronek RN.

## 2014-10-03 NOTE — Brief Op Note (Addendum)
  [facility] HEART CATH NOTE 10/02/2014 - 10/03/2014  10:44 AM  PATIENT:  Lance Collier  69 y.o. male with CAD-CABGx3 admitted for o/n hydration for R&LHC-native & graft angiography for Class III Angina/DOE  PRE-OPERATIVE DIAGNOSIS:  sob with activity; Class III Angina  POST-OPERATIVE DIAGNOSIS:    Severe proximal ~80% stenosis of SVG-RCA as culprit lesion (distal RCA with competetive flow & ~60% hazy ISR in native RCA  Patent LIMA-LAD & SVG-OM1/RI with retrograde filling OM2  Known native LM disease with moderate OM2 disease.  Normal CO/CI by Fick & TD  Moderate to severely elevated LVEDP/PCWP (EDP ~22 mmHg, PCWP 28 mmHg) - with severe systemic HTN   PROCEDURE:  Procedure(s): LEFT AND RIGHT HEART CATHETERIZATION WITH CORONARY/GRAFT ANGIOGRAM (N/A)   RFA - 5 Fr, JL4 (LCA) & JR4(RCA, SVG-OM, SVG-RCA, LIMA0-LAD) -> angled pigtail catheters over wire  RFV - 7 Fr --> Swan Ganz Catheter for RA,RV, PA & PCWP,CO measurement (used wire for manipulation into PA).  SURGEON:  Surgeon(s) and Role:    * Leonie Man, MD - Primary  ANESTHESIA:   local lidocaine, 2 mg IV Versed, 50 mcg IV Fentanyl  EBL:   <10 MEDICATIONS USED:  Contrast ~70 mL; Hydralazine 20 mg IV x1  SHEATHS:  Removed in PACU holding - manual pressure  DICTATION: .Note written in [place]: IVF hydration x 6 hr post cath; Plan staged PCI to SVG-RCA on Thurs 4/21 (early AM admission & PM case to allow hydration)   D/c today after hydration & bedrest.  BMP on Friday 4/15 & Wed 4/20.  Increase Hydralazine to 100 mg BID; return to previous standing Lasix 20 mg daily & Increase Imdur to 60 mg  PATIENT DISPOSITION:  PACU - hemodynamically stable.    Leonie Man, M.D., M.S. Interventional Cardiologist   Pager # [phone]

## 2014-10-03 NOTE — Progress Notes (Signed)
Pt Bed rest over; pt ambulated to BR to void x1; cath site remains level 0, no hematoma, bruising or active bleeding. Cath site dsg remain clean, dry and intact. Pt up in the chair with call light with reach. IV remains intact and infusing. Will continue to monitor quietly. Francis Gaines Zavon Hyson RN.

## 2014-10-03 NOTE — Progress Notes (Signed)
PROGRESS NOTE  Subjective:   69 yo with CAD, admitted yesterday for hydration prior to cath today. No CP overnight. Creatinine has come down some.   Objective:    Vital Signs:   Temp:  [97.4 F (36.3 C)-98.4 F (36.9 C)] 98.4 F (36.9 C) (04/12 0458) Pulse Rate:  [56-58] 58 (04/12 0458) Resp:  [16-18] 18 (04/12 0458) BP: (136-156)/(55-59) 156/57 mmHg (04/12 0458) SpO2:  [95 %-97 %] 95 % (04/12 0458) Weight:  [212 lb 8.4 oz (96.4 kg)] 212 lb 8.4 oz (96.4 kg) (04/11 0837)  Last BM Date: 10/01/14   24-hour weight change: Weight change:   Weight trends: Filed Weights   10/02/14 0837  Weight: 212 lb 8.4 oz (96.4 kg)    Intake/Output:  04/11 0701 - 04/12 0700 In: 1295.5 [P.O.:1080; I.V.:215.5] Out: -      Physical Exam: BP 156/57 mmHg  Pulse 58  Temp(Src) 98.4 F (36.9 C) (Oral)  Resp 18  Ht 5\' 8"  (1.727 m)  Wt 212 lb 8.4 oz (96.4 kg)  BMI 32.32 kg/m2  SpO2 95%  Wt Readings from Last 3 Encounters:  10/02/14 212 lb 8.4 oz (96.4 kg)  09/28/14 216 lb 9.6 oz (98.249 kg)  09/21/14 218 lb 1.6 oz (98.93 kg)    General: Vital signs reviewed and noted.   Head: Normocephalic, atraumatic.  Eyes: conjunctivae/corneas clear.  EOM's intact.   Throat: normal  Neck:  normal   Lungs:    clear   Heart:  RR   Abdomen:  Soft, non-tender, non-distended    Extremities: Trace edema    Neurologic: A&O X3, CN II - XII are grossly intact.   Psych: Normal     Labs: BMET:  Recent Labs  10/02/14 1520 10/03/14 0508  NA 135 138  K 4.1 4.0  CL 97 100  CO2 25 28  GLUCOSE 395* 199*  BUN 42* 39*  CREATININE 2.54* 2.15*  CALCIUM 9.0 9.1    Liver function tests: No results for input(s): AST, ALT, ALKPHOS, BILITOT, PROT, ALBUMIN in the last 72 hours. No results for input(s): LIPASE, AMYLASE in the last 72 hours.  CBC:  Recent Labs  10/02/14 1520  WBC 5.5  HGB 10.9*  HCT 34.2*  MCV 85.1  PLT 263    Cardiac Enzymes: No results for input(s): CKTOTAL,  CKMB, TROPONINI in the last 72 hours.  Coagulation Studies:  Recent Labs  10/03/14 0508  LABPROT 13.1  INR 0.98    Other: Invalid input(s): POCBNP No results for input(s): DDIMER in the last 72 hours. No results for input(s): HGBA1C in the last 72 hours. No results for input(s): CHOL, HDL, LDLCALC, TRIG, CHOLHDL in the last 72 hours. No results for input(s): TSH, T4TOTAL, T3FREE, THYROIDAB in the last 72 hours.  Invalid input(s): FREET3 No results for input(s): VITAMINB12, FOLATE, FERRITIN, TIBC, IRON, RETICCTPCT in the last 72 hours.   Other results:  EKG  ( personally reviewed )  -3/31 - sinus brady at 58.  No ST or T wave changes.     Medications:    Infusions: . sodium chloride      Scheduled Medications: . amLODipine  10 mg Oral Daily  . aspirin  81 mg Oral Pre-Cath  . aspirin EC  325 mg Oral Daily  . carvedilol  12.5 mg Oral BID WC  . fenofibrate  160 mg Oral Daily  . hydrALAZINE  50 mg Oral BID  . insulin aspart  0-15 Units Subcutaneous TID  WC  . [START ON 10/04/2014] insulin glargine  40 Units Subcutaneous Daily  . insulin glargine  70 Units Subcutaneous QHS  . isosorbide mononitrate  30 mg Oral Daily  . pantoprazole  40 mg Oral Daily  . rosuvastatin  10 mg Oral Q48H  . sodium chloride  3 mL Intravenous Q12H  . 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 CAD:  For diagnostic cath today.  I have discussed with Dr. Ellyn Hack.   2. CKD: stage - 3-4   Cr has improved with IVF.  Minimal contrast for cath   3. DM:  Stable,    4. Essential HTN:  Stable      Disposition:  Length of  Stay: 1  Thayer Headings, Brooke Bonito., MD, [facility] 10/03/2014, 8:10 AM Office [phone] Pager [phone]

## 2014-10-03 NOTE — Progress Notes (Signed)
UR completed 

## 2014-10-05 ENCOUNTER — Telehealth: Payer: Self-pay | Admitting: Cardiology

## 2014-10-05 DIAGNOSIS — N184 Chronic kidney disease, stage 4 (severe): Secondary | ICD-10-CM | POA: Diagnosis not present

## 2014-10-05 NOTE — Telephone Encounter (Signed)
Katrina called, pt presented to lab w/o order. Order inquiry shows he has standing orders for BMET for [facility]  Ordered 1 time BMET for [employer]. Advised Katrina to call back if needed.

## 2014-10-06 LAB — BASIC METABOLIC PANEL
BUN: 35 mg/dL — ABNORMAL HIGH (ref 6–23)
CALCIUM: 9.3 mg/dL (ref 8.4–10.5)
CHLORIDE: 100 meq/L (ref 96–112)
CO2: 23 mEq/L (ref 19–32)
CREATININE: 2.31 mg/dL — AB (ref 0.50–1.35)
Glucose, Bld: 176 mg/dL — ABNORMAL HIGH (ref 70–99)
Potassium: 4.8 mEq/L (ref 3.5–5.3)
Sodium: 135 mEq/L (ref 135–145)

## 2014-10-09 ENCOUNTER — Other Ambulatory Visit: Payer: Self-pay | Admitting: *Deleted

## 2014-10-09 DIAGNOSIS — Z01818 Encounter for other preprocedural examination: Secondary | ICD-10-CM

## 2014-10-11 ENCOUNTER — Other Ambulatory Visit: Payer: Self-pay | Admitting: *Deleted

## 2014-10-11 ENCOUNTER — Telehealth: Payer: Self-pay | Admitting: *Deleted

## 2014-10-11 ENCOUNTER — Telehealth: Payer: Self-pay | Admitting: Cardiology

## 2014-10-11 DIAGNOSIS — Z79899 Other long term (current) drug therapy: Secondary | ICD-10-CM | POA: Diagnosis not present

## 2014-10-11 DIAGNOSIS — Z01812 Encounter for preprocedural laboratory examination: Secondary | ICD-10-CM | POA: Diagnosis not present

## 2014-10-11 DIAGNOSIS — R7989 Other specified abnormal findings of blood chemistry: Secondary | ICD-10-CM

## 2014-10-11 DIAGNOSIS — R5383 Other fatigue: Secondary | ICD-10-CM | POA: Diagnosis not present

## 2014-10-11 DIAGNOSIS — IMO0002 Reserved for concepts with insufficient information to code with codable children: Secondary | ICD-10-CM

## 2014-10-11 DIAGNOSIS — D689 Coagulation defect, unspecified: Secondary | ICD-10-CM | POA: Diagnosis not present

## 2014-10-11 DIAGNOSIS — R799 Abnormal finding of blood chemistry, unspecified: Secondary | ICD-10-CM | POA: Diagnosis not present

## 2014-10-11 DIAGNOSIS — R748 Abnormal levels of other serum enzymes: Secondary | ICD-10-CM | POA: Diagnosis not present

## 2014-10-11 LAB — CBC
HEMATOCRIT: 31.3 % — AB (ref 39.0–52.0)
HEMOGLOBIN: 10.3 g/dL — AB (ref 13.0–17.0)
MCH: 27.6 pg (ref 26.0–34.0)
MCHC: 32.9 g/dL (ref 30.0–36.0)
MCV: 83.9 fL (ref 78.0–100.0)
MPV: 9.9 fL (ref 8.6–12.4)
Platelets: 289 10*3/uL (ref 150–400)
RBC: 3.73 MIL/uL — ABNORMAL LOW (ref 4.22–5.81)
RDW: 13.8 % (ref 11.5–15.5)
WBC: 5.5 10*3/uL (ref 4.0–10.5)

## 2014-10-11 LAB — BASIC METABOLIC PANEL
BUN: 31 mg/dL — AB (ref 6–23)
CHLORIDE: 105 meq/L (ref 96–112)
CO2: 24 meq/L (ref 19–32)
Calcium: 8.8 mg/dL (ref 8.4–10.5)
Creat: 2.15 mg/dL — ABNORMAL HIGH (ref 0.50–1.35)
Glucose, Bld: 166 mg/dL — ABNORMAL HIGH (ref 70–99)
Potassium: 4.8 mEq/L (ref 3.5–5.3)
Sodium: 137 mEq/L (ref 135–145)

## 2014-10-11 LAB — PROTIME-INR
INR: 1.01 (ref <1.50)
Prothrombin Time: 13.3 seconds (ref 11.6–15.2)

## 2014-10-11 LAB — APTT: APTT: 31 s (ref 24–37)

## 2014-10-11 LAB — TSH: TSH: 6.428 u[IU]/mL — ABNORMAL HIGH (ref 0.350–4.500)

## 2014-10-11 MED ORDER — SODIUM CHLORIDE 0.9 % IV SOLN: INTRAVENOUS | Status: DC

## 2014-10-11 NOTE — Telephone Encounter (Signed)
Patient presented to [facility] labs for pre-cath Standard pre-cath lab order slips given to patient.  Informed him of plan for tomorrow - arrive at originally scheduled time for cath at 130pm

## 2014-10-11 NOTE — Telephone Encounter (Signed)
Spoke with Ivin Booty, RN - was informed that this patient's PCI needed to be moved to 1pm on 4/21 and he needs to have IV hydration prior to cath. Called cath lab and this has already been moved. Called short stay to inform them that patient will still arrive early for IV hydration prior to cath/PCI. Hospital order placed for normal saline IV infusion @ 100cc/hr (per cath orders).   Left message for patient to return call to notify him of this update to his care plan for tomorrow.

## 2014-10-11 NOTE — Telephone Encounter (Signed)
Lance Collier has STAT labs for this pt

## 2014-10-11 NOTE — Telephone Encounter (Signed)
Labs were ordered as STAT as patient has LHC/PCI tomorrow. Labs will be loaded into EPIC

## 2014-10-12 ENCOUNTER — Encounter (HOSPITAL_COMMUNITY): Payer: Self-pay | Admitting: *Deleted

## 2014-10-12 ENCOUNTER — Ambulatory Visit (HOSPITAL_COMMUNITY)
Admission: RE | Admit: 2014-10-12 | Discharge: 2014-10-13 | Disposition: A | Discharge status: Home / Self Care | Payer: Medicare Other | Source: Ambulatory Visit | Attending: Cardiology | Admitting: Cardiology

## 2014-10-12 ENCOUNTER — Encounter (HOSPITAL_COMMUNITY)
Admission: RE | Disposition: A | Discharge status: Home / Self Care | Payer: Self-pay | Source: Ambulatory Visit | Attending: Cardiology

## 2014-10-12 DIAGNOSIS — Z955 Presence of coronary angioplasty implant and graft: Secondary | ICD-10-CM | POA: Diagnosis not present

## 2014-10-12 DIAGNOSIS — Z7902 Long term (current) use of antithrombotics/antiplatelets: Secondary | ICD-10-CM | POA: Insufficient documentation

## 2014-10-12 DIAGNOSIS — I10 Essential (primary) hypertension: Secondary | ICD-10-CM | POA: Diagnosis present

## 2014-10-12 DIAGNOSIS — E785 Hyperlipidemia, unspecified: Secondary | ICD-10-CM | POA: Diagnosis present

## 2014-10-12 DIAGNOSIS — I739 Peripheral vascular disease, unspecified: Secondary | ICD-10-CM | POA: Diagnosis not present

## 2014-10-12 DIAGNOSIS — I129 Hypertensive chronic kidney disease with stage 1 through stage 4 chronic kidney disease, or unspecified chronic kidney disease: Secondary | ICD-10-CM | POA: Diagnosis not present

## 2014-10-12 DIAGNOSIS — E1122 Type 2 diabetes mellitus with diabetic chronic kidney disease: Secondary | ICD-10-CM | POA: Insufficient documentation

## 2014-10-12 DIAGNOSIS — Z79899 Other long term (current) drug therapy: Secondary | ICD-10-CM | POA: Insufficient documentation

## 2014-10-12 DIAGNOSIS — I25718 Atherosclerosis of autologous vein coronary artery bypass graft(s) with other forms of angina pectoris: Secondary | ICD-10-CM

## 2014-10-12 DIAGNOSIS — Z87891 Personal history of nicotine dependence: Secondary | ICD-10-CM | POA: Diagnosis not present

## 2014-10-12 DIAGNOSIS — E669 Obesity, unspecified: Secondary | ICD-10-CM | POA: Diagnosis present

## 2014-10-12 DIAGNOSIS — Z7982 Long term (current) use of aspirin: Secondary | ICD-10-CM | POA: Diagnosis not present

## 2014-10-12 DIAGNOSIS — E119 Type 2 diabetes mellitus without complications: Secondary | ICD-10-CM

## 2014-10-12 DIAGNOSIS — Z6831 Body mass index (BMI) 31.0-31.9, adult: Secondary | ICD-10-CM | POA: Diagnosis not present

## 2014-10-12 DIAGNOSIS — I2510 Atherosclerotic heart disease of native coronary artery without angina pectoris: Secondary | ICD-10-CM | POA: Insufficient documentation

## 2014-10-12 DIAGNOSIS — I25709 Atherosclerosis of coronary artery bypass graft(s), unspecified, with unspecified angina pectoris: Secondary | ICD-10-CM | POA: Diagnosis present

## 2014-10-12 DIAGNOSIS — E11319 Type 2 diabetes mellitus with unspecified diabetic retinopathy without macular edema: Secondary | ICD-10-CM | POA: Insufficient documentation

## 2014-10-12 DIAGNOSIS — E780 Pure hypercholesterolemia: Secondary | ICD-10-CM | POA: Diagnosis not present

## 2014-10-12 DIAGNOSIS — I209 Angina pectoris, unspecified: Secondary | ICD-10-CM

## 2014-10-12 DIAGNOSIS — Z9861 Coronary angioplasty status: Secondary | ICD-10-CM

## 2014-10-12 DIAGNOSIS — Z951 Presence of aortocoronary bypass graft: Secondary | ICD-10-CM | POA: Insufficient documentation

## 2014-10-12 DIAGNOSIS — IMO0002 Reserved for concepts with insufficient information to code with codable children: Secondary | ICD-10-CM

## 2014-10-12 DIAGNOSIS — R0609 Other forms of dyspnea: Secondary | ICD-10-CM | POA: Diagnosis present

## 2014-10-12 DIAGNOSIS — N183 Chronic kidney disease, stage 3 (moderate): Secondary | ICD-10-CM | POA: Diagnosis not present

## 2014-10-12 DIAGNOSIS — Z01818 Encounter for other preprocedural examination: Secondary | ICD-10-CM

## 2014-10-12 DIAGNOSIS — I358 Other nonrheumatic aortic valve disorders: Secondary | ICD-10-CM | POA: Insufficient documentation

## 2014-10-12 DIAGNOSIS — K219 Gastro-esophageal reflux disease without esophagitis: Secondary | ICD-10-CM | POA: Insufficient documentation

## 2014-10-12 DIAGNOSIS — Z794 Long term (current) use of insulin: Secondary | ICD-10-CM | POA: Diagnosis not present

## 2014-10-12 DIAGNOSIS — R7989 Other specified abnormal findings of blood chemistry: Secondary | ICD-10-CM

## 2014-10-12 DIAGNOSIS — R799 Abnormal finding of blood chemistry, unspecified: Secondary | ICD-10-CM

## 2014-10-12 DIAGNOSIS — R0600 Dyspnea, unspecified: Secondary | ICD-10-CM | POA: Diagnosis present

## 2014-10-12 DIAGNOSIS — I25720 Atherosclerosis of autologous artery coronary artery bypass graft(s) with unstable angina pectoris: Secondary | ICD-10-CM | POA: Insufficient documentation

## 2014-10-12 DIAGNOSIS — N184 Chronic kidney disease, stage 4 (severe): Secondary | ICD-10-CM

## 2014-10-12 DIAGNOSIS — F419 Anxiety disorder, unspecified: Secondary | ICD-10-CM | POA: Diagnosis not present

## 2014-10-12 HISTORY — PX: PERCUTANEOUS CORONARY STENT INTERVENTION (PCI-S): SHX5485

## 2014-10-12 HISTORY — DX: Chronic kidney disease, stage 3 (moderate): N18.3

## 2014-10-12 HISTORY — DX: Chronic kidney disease, stage 3 unspecified: N18.30

## 2014-10-12 LAB — POCT ACTIVATED CLOTTING TIME
ACTIVATED CLOTTING TIME: 220 s
ACTIVATED CLOTTING TIME: 190 s
Activated Clotting Time: 361 seconds
Activated Clotting Time: 134 seconds

## 2014-10-12 LAB — GLUCOSE, CAPILLARY
GLUCOSE-CAPILLARY: 96 mg/dL (ref 70–99)
Glucose-Capillary: 168 mg/dL — ABNORMAL HIGH (ref 70–99)
Glucose-Capillary: 147 mg/dL — ABNORMAL HIGH (ref 70–99)

## 2014-10-12 SURGERY — PERCUTANEOUS CORONARY STENT INTERVENTION (PCI-S)
Anesthesia: LOCAL

## 2014-10-12 MED ORDER — ISOSORBIDE MONONITRATE ER 60 MG PO TB24
60.0000 mg | ORAL_TABLET | Freq: Every day | ORAL | Status: DC
  Administered 2014-10-12 – 2014-10-13 (×2): 60 mg via ORAL
  Filled 2014-10-12 (×2): qty 1

## 2014-10-12 MED ORDER — ASPIRIN 81 MG PO CHEW
CHEWABLE_TABLET | ORAL | Status: AC
  Filled 2014-10-12: qty 1

## 2014-10-12 MED ORDER — SODIUM CHLORIDE 0.9 % IV SOLN: INTRAVENOUS | Status: DC

## 2014-10-12 MED ORDER — MIDAZOLAM HCL 2 MG/2ML IJ SOLN
INTRAMUSCULAR | Status: AC
  Filled 2014-10-12: qty 2

## 2014-10-12 MED ORDER — FENTANYL CITRATE (PF) 100 MCG/2ML IJ SOLN
INTRAMUSCULAR | Status: AC
  Filled 2014-10-12: qty 2

## 2014-10-12 MED ORDER — INSULIN GLARGINE 100 UNIT/ML ~~LOC~~ SOLN
40.0000 [IU] | Freq: Every day | SUBCUTANEOUS | Status: DC
  Administered 2014-10-13: 11:00:00 40 [IU] via SUBCUTANEOUS
  Filled 2014-10-12: qty 0.4

## 2014-10-12 MED ORDER — CLOPIDOGREL BISULFATE 75 MG PO TABS
ORAL_TABLET | ORAL | Status: AC
  Filled 2014-10-12: qty 2

## 2014-10-12 MED ORDER — CLOPIDOGREL BISULFATE 75 MG PO TABS
ORAL_TABLET | ORAL | Status: AC
  Filled 2014-10-12: qty 1

## 2014-10-12 MED ORDER — SODIUM CHLORIDE 0.9 % IV SOLN
INTRAVENOUS | Status: DC
  Administered 2014-10-12: 08:00:00 via INTRAVENOUS

## 2014-10-12 MED ORDER — HYDRALAZINE HCL 50 MG PO TABS
100.0000 mg | ORAL_TABLET | Freq: Two times a day (BID) | ORAL | Status: DC
  Administered 2014-10-12 – 2014-10-13 (×3): 100 mg via ORAL
  Filled 2014-10-12 (×4): qty 2

## 2014-10-12 MED ORDER — LIDOCAINE HCL (PF) 1 % IJ SOLN
INTRAMUSCULAR | Status: AC
  Filled 2014-10-12: qty 30

## 2014-10-12 MED ORDER — INSULIN GLARGINE 100 UNIT/ML ~~LOC~~ SOLN
70.0000 [IU] | Freq: Every day | SUBCUTANEOUS | Status: DC
  Administered 2014-10-12: 22:00:00 70 [IU] via SUBCUTANEOUS
  Filled 2014-10-12 (×2): qty 0.7

## 2014-10-12 MED ORDER — HEPARIN SODIUM (PORCINE) 1000 UNIT/ML IJ SOLN
INTRAMUSCULAR | Status: AC
  Filled 2014-10-12: qty 1

## 2014-10-12 MED ORDER — ACETAMINOPHEN 325 MG PO TABS
650.0000 mg | ORAL_TABLET | ORAL | Status: DC | PRN
Start: 2014-10-12 — End: 2014-10-13

## 2014-10-12 MED ORDER — ACETAMINOPHEN 325 MG PO TABS: 650.0000 mg | ORAL_TABLET | ORAL | Status: DC | PRN

## 2014-10-12 MED ORDER — AMLODIPINE BESYLATE 10 MG PO TABS
10.0000 mg | ORAL_TABLET | Freq: Every day | ORAL | Status: DC
  Administered 2014-10-12 – 2014-10-13 (×2): 10 mg via ORAL
  Filled 2014-10-12 (×2): qty 1

## 2014-10-12 MED ORDER — FUROSEMIDE 20 MG PO TABS
20.0000 mg | ORAL_TABLET | Freq: Every day | ORAL | Status: DC
  Administered 2014-10-13: 11:00:00 20 mg via ORAL
  Filled 2014-10-12: qty 1

## 2014-10-12 MED ORDER — FENOFIBRATE 160 MG PO TABS
160.0000 mg | ORAL_TABLET | Freq: Every day | ORAL | Status: DC
  Administered 2014-10-13: 160 mg via ORAL
  Filled 2014-10-12: qty 1

## 2014-10-12 MED ORDER — INSULIN ASPART 100 UNIT/ML ~~LOC~~ SOLN
10.0000 [IU] | Freq: Every day | SUBCUTANEOUS | Status: DC
  Administered 2014-10-13: 08:00:00 10 [IU] via SUBCUTANEOUS

## 2014-10-12 MED ORDER — INSULIN GLARGINE 100 UNIT/ML ~~LOC~~ SOLN: 40.0000 [IU] | Freq: Two times a day (BID) | SUBCUTANEOUS | Status: DC

## 2014-10-12 MED ORDER — CLOPIDOGREL BISULFATE 75 MG PO TABS
75.0000 mg | ORAL_TABLET | Freq: Every day | ORAL | Status: DC
  Administered 2014-10-13: 11:00:00 75 mg via ORAL
  Filled 2014-10-12: qty 1

## 2014-10-12 MED ORDER — INSULIN ASPART 100 UNIT/ML ~~LOC~~ SOLN: 15.0000 [IU] | Freq: Two times a day (BID) | SUBCUTANEOUS | Status: DC

## 2014-10-12 MED ORDER — ASPIRIN 81 MG PO CHEW
81.0000 mg | CHEWABLE_TABLET | Freq: Every day | ORAL | Status: DC
  Administered 2014-10-13: 11:00:00 81 mg via ORAL
  Filled 2014-10-12: qty 1

## 2014-10-12 MED ORDER — SODIUM CHLORIDE 0.9 % IV SOLN: INTRAVENOUS | Status: AC

## 2014-10-12 MED ORDER — MORPHINE SULFATE 2 MG/ML IJ SOLN: 2.0000 mg | INTRAMUSCULAR | Status: DC | PRN

## 2014-10-12 MED ORDER — INSULIN LISPRO 100 UNIT/ML (KWIKPEN): 10.0000 [IU] | PEN_INJECTOR | Freq: Three times a day (TID) | SUBCUTANEOUS | Status: DC

## 2014-10-12 MED ORDER — SODIUM CHLORIDE 0.9 % IV SOLN
250.0000 mL | INTRAVENOUS | Status: DC | PRN
Start: 2014-10-12 — End: 2014-10-13

## 2014-10-12 MED ORDER — SODIUM CHLORIDE 0.9 % IJ SOLN: 3.0000 mL | INTRAMUSCULAR | Status: DC | PRN

## 2014-10-12 MED ORDER — CARVEDILOL 12.5 MG PO TABS
12.5000 mg | ORAL_TABLET | Freq: Two times a day (BID) | ORAL | Status: DC
  Administered 2014-10-12 – 2014-10-13 (×2): 12.5 mg via ORAL
  Filled 2014-10-12 (×3): qty 1

## 2014-10-12 MED ORDER — NITROGLYCERIN 0.4 MG SL SUBL: 0.4000 mg | SUBLINGUAL_TABLET | SUBLINGUAL | Status: DC | PRN

## 2014-10-12 MED ORDER — ONDANSETRON HCL 4 MG/2ML IJ SOLN: 4.0000 mg | Freq: Four times a day (QID) | INTRAMUSCULAR | Status: DC | PRN

## 2014-10-12 MED ORDER — CLOPIDOGREL BISULFATE 75 MG PO TABS
75.0000 mg | ORAL_TABLET | Freq: Every day | ORAL | Status: AC
  Administered 2014-10-12: 75 mg via ORAL

## 2014-10-12 MED ORDER — ROSUVASTATIN CALCIUM 10 MG PO TABS
10.0000 mg | ORAL_TABLET | ORAL | Status: DC
  Administered 2014-10-13: 10 mg via ORAL
  Filled 2014-10-12: qty 1

## 2014-10-12 MED ORDER — HYDRALAZINE HCL 20 MG/ML IJ SOLN: 10.0000 mg | Freq: Four times a day (QID) | INTRAMUSCULAR | Status: DC | PRN

## 2014-10-12 MED ORDER — NITROGLYCERIN 1 MG/10 ML FOR IR/CATH LAB
INTRA_ARTERIAL | Status: AC
  Filled 2014-10-12: qty 10

## 2014-10-12 MED ORDER — SODIUM CHLORIDE 0.9 % IJ SOLN
3.0000 mL | Freq: Two times a day (BID) | INTRAMUSCULAR | Status: DC
  Administered 2014-10-12: 22:00:00 3 mL via INTRAVENOUS

## 2014-10-12 MED ORDER — ASPIRIN 81 MG PO CHEW
81.0000 mg | CHEWABLE_TABLET | ORAL | Status: AC
  Administered 2014-10-12: 81 mg via ORAL

## 2014-10-12 MED ORDER — DIAZEPAM 2 MG PO TABS: 2.0000 mg | ORAL_TABLET | ORAL | Status: DC | PRN

## 2014-10-12 MED ORDER — PANTOPRAZOLE SODIUM 40 MG PO TBEC
40.0000 mg | DELAYED_RELEASE_TABLET | Freq: Every day | ORAL | Status: DC
  Administered 2014-10-12 – 2014-10-13 (×2): 40 mg via ORAL
  Filled 2014-10-12 (×2): qty 1

## 2014-10-12 MED ORDER — SODIUM CHLORIDE 0.9 % IJ SOLN: 3.0000 mL | Freq: Two times a day (BID) | INTRAMUSCULAR | Status: DC

## 2014-10-12 MED ORDER — HEPARIN (PORCINE) IN NACL 2-0.9 UNIT/ML-% IJ SOLN
INTRAMUSCULAR | Status: AC
  Filled 2014-10-12: qty 1000

## 2014-10-12 NOTE — CV Procedure (Signed)
PERCUTANEOUS CORONARY INTERVENTION REPORT  NAME:  Lance Collier   MRN: [id number] DOB:  1945-08-20   ADMIT DATE: 10/12/2014 Procedure Date: 10/12/2014  INTERVENTIONAL CARDIOLOGIST: Leonie Man, M.D., MS PRIMARY CARE PROVIDER: Sheela Stack, MD PRIMARY CARDIOLOGIST: Leonie Man, MD, MS  PATIENT:  Lance Collier is a 69 y.o. male who had CABG in Jan 2015 for Crescendo Angina (previously had complex PCI of RCA.  Was doing well until March of this year, when he began noting progressively worsening exertional dyspnea & angina - similar to pre-CABG. Echocardiographic evaluation was not suggestive of CHF, so with Class III level Anginal equivalent symptoms, he had Diagnostic catheterization on 4/12 revealing severe disease in the SVG-RCA.  He now presents for staged PCI to allow pre-hydration for CKD-III.  PRE-OPERATIVE DIAGNOSIS:    Class III Angina  Severe CAD of SVG-RCA  PROCEDURES PERFORMED:    Percutaneous Coronary Intervention of the SVG-RCA using distal protection device - ostium/proximal - Xience Alpine DES 3.0 mm x 38 mm (3.3 mm)  PROCEDURE: The patient was brought to the [facility]nd [facility] in the fasting state and prepped and draped in the usual sterile fashion for RIGHT COMMON FEMORAL ARTERY access.   Sterile technique was used including antiseptics, cap, gloves, gown, hand hygiene, mask and sheet. Skin prep: Chlorhexidine.   Consent: Risks of procedure as well as the alternatives and risks of each were explained to the (patient/caregiver). Consent for procedure obtained.   Time Out: Verified patient identification, verified procedure, site/side was marked, verified correct patient position, special equipment/implants available, medications/allergies/relevent history reviewed, required imaging and test results available. Performed.  Access:   Right Common Femora Artery: 6 Fr Sheath -  fluoroscopically guided modified Seldinger  Technique   Femoral access used to facilitate graft PCI with minimal contrast. FINDINGS:  Hemodynamics:   Central Aortic Pressure / Mean: 134/43/70 mmHg  Coronary Anatomy:  SVG-RPDA: There is diffuse proximal irregularities with a focal 80% stenosis in the proximal vessel before the graft then normalizes into a relatively normal caliber vessel that has no further disease as it reaches down to the PDA. No significant disease in the  After reviewing the initial angiography, the culprit lesion was thought to be Proximal SVG-RCA 80% focal & diffuse ~50% disease (ostial to valve) .  Preparation were made to proceed with PCI on this lesion.  Based on the severity of the proximal lesion, I felt it safest to perform small balloon compliant angioplasty of the most significant portion of the lesion to allow for safe passage of the filter wire distally.  Percutaneous Coronary Intervention:   Guide: 6 Fr   AR2 Guide  Guidewire: BMW - but unable to deploy Spiderwire filter, so both wires removed & a 2.5-3.5 mm Filterwire was used. Predilation Balloon: Euphora 1.5 mm x 12 mm; used prior to advancing filter - over BMW ->   10 Atm x 30 Sec - 3 inflations from distal to proximal  Post inflation angiography revealed widely patent graft and distal vessel. No evidence of no reflow.  The balloon was then removed and after the 2nd attempt the FilterWire was successfully deployed in the distal graft. Stent: Xience Alpine DES 3.0 mm x 38 mm;   18 Atm x 30 Sec, -- 3.24 mm Post-dilation Balloon: Canones Euphora 3.25 mm x 20 mm;   18 Atm x 30 Sec x 2 inflations distal & mid stent  20 Atm x 30 Sec at the ostium  Final Diameter: 3.4 mm ostial, 3.3 mm throughout the stent.  The filter wire basket was retrieved using the retrieval sheath & removed out of the body.   Post deployment angiography in multiple views, with and without guidewire in place revealed excellent stent deployment and lesion coverage.  There was  no evidence of dissection or perforation.  Sheath sutured Place - to be removed in the Post-procedure unit with manual pressure for hemostasis. A focal calcific atheroma was noted at the sheath insertion site on femoral angiography.  MEDICATIONS:  Anesthesia:  Local Lidocaine 16 ml  Sedation:  20 mg IV Versed, 50 mcg IV fentanyl ;   Omnipaque Contrast: 80 ml  Anticoagulation:  IV Heparin 1000 Units ; ACT > 300 Sec achieved.  Anti-Platelet Agent:  Plavix 150 mg (has been on 75 mg daily since 4/12)  PATIENT DISPOSITION:    The patient was transferred to the PACU holding area in a hemodynamicaly stable, chest pain free condition.  The patient tolerated the procedure well, and there were no complications.  EBL:   < 20 ml  The patient was stable before, during, and after the procedure.  POST-OPERATIVE DIAGNOSIS:    Successful PCI of SVG-RCA using a distal protection device - placement of a single DES stent from ostium through the proximal segment of the graft.  PLAN OF CARE:  Standard post femoral cath care with sheath removal based on ACT  Aggressive IV Hydration for chronic renal insufficiency.  Anticipate discharge the morning.  He will need follow-up with me or PA post procedure in 2-3 weeks.    Leonie Man, M.D., M.S. Interventional Cardiologist   Pager # [phone]

## 2014-10-12 NOTE — H&P (Signed)
History and Physical Interval Note:  NAME:  Lance Collier   MRN: [id number] DOB:  [birth year]   ADMIT DATE: 10/12/2014   10/12/2014 1:08 PM  Lance Collier is a 69 y.o. male  with a PMH below who presents STAGED PCI OF SVG-RCA.  I saw Lance Collier on April 9 for progressively worsening exertional dyspnea and now with chest tightness. He had previously seen Dr. [name] for urgent evaluation in the onset of symptoms. An echocardiogram did not show any significant changes to explain his symptoms. We therefore decided the best course of action was to proceed with cardiac catheterization was performed on April 12. He was preadmitted for hydration the evening for his catheterization procedure as his renal function showed a worsening creatinine. He underwent cardiac catheterization which revealed disease in the ER RCA graft. Based on the amount of contrast that was used for his diagnostic imaging, the decision was made to initiate medical management and then planned staged PCI at a later date to allow time for renal recovery. He now presents for staged PCI.  Cardiac Histroy:  Dec 2012 - Complex RCA PCI (Dr. 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 Servando Snare)  Echo 06/29/2013: EF 60-65%, Aortic Sclerosis. No regional WMA  Graduated from [employer] in 10/2013  2-D echocardiogram ordered April 4:mild concentric LVH. EF 55-60%. Aortic sclerosis no stenosis. Moderate MAC. Mild to moderate TR. PA pressure 34 mm mercury  Cardiac CATH - 4/12:   Severe proximal ~80% stenosis of SVG-RCA as culprit lesion (distal RCA with competetive flow & ~60% hazy ISR in native RCA  Patent LIMA-LAD & SVG-OM1/RI with retrograde filling OM2  PLAN - STAGED PCI OF SVG-RCA  (pre-admit in AM for prolonged hydration given CKD   Past Medical History  Diagnosis Date  . Hypertension   . CAD S/P  percutaneous coronary angioplasty 06/12/2011    status post complex PCI to the RCA requiring the use of GuideLiner catheter.  2  . Presence of drug coated stent in right coronary artery 06/12/2011    Subtotal proximal RCA (tortuous) --> complex PCI requiring Guideliner: 2 overlapping Promus element DES stents.  . 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  Likely thromboembolic event with PDA occlusion. 06/12/2011    Very difficult, complex procedure requiring multiple guidewires, guide-liner, etc.  Angiographic evidence of distal thrombo / anthero-embolism as likely etiology of Troponin of ~1.58. Pt remains asymptomatic.   . 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 Aortic sclerosis  - without Stenosis  March 2014    Echo: Aortic sclerosis without stenosis. EF 55-60% with normal WM; mild concentric hypertrophy with normal relaxation.  Marland Kitchen PAD (peripheral artery disease)     with claudication  . High cholesterol   . Anemia   . GERD (gastroesophageal reflux disease)   . Anxiety   . Complication of anesthesia     "I've had name recall recognition problems since my last anesthesia"  . Heart murmur   . Childhood asthma   . DM (diabetes mellitus) type II uncontrolled with eye manifestation 2013    Diabetic retinopathy with retinal hemorrhages since;, recurrent in August 2014 treated with Avastin shots  . Migraine ~ 2004    "once"  .  Chronic kidney disease     "mild; related to diabetes" (10/02/2014)   Past Surgical History  Procedure Laterality Date  . Tonsillectomy    . Nm myoview ltd  January '13    Diaphragmatic attenuation versus inferior infarct. No ischemia. Normal EF normal wall motion.  Marland Kitchen Nm myoview ltd  June 2014    no ischemia  . Coronary angioplasty with stent placement Right September 2012    2 overlapping Promus DES 2.7 mm at 12 mm x2; postdilated to 3  mm  . Cardiac catheterization  06/28/2013    ostial LM disese, RCA ~40-50%ISR  . Retinal laser procedure Bilateral     "more than once"   . Intraoperative transesophageal echocardiogram N/A 07/01/2013    Procedure: INTRAOPERATIVE TRANSESOPHAGEAL ECHOCARDIOGRAM;  Surgeon: Grace Isaac, MD;  Location: [place];  Service: Open Heart Surgery;  Laterality: N/A;  . Transthoracic echocardiogram  06/29/2013    Focal basal hypertrophy with normal LV size. EF 60-65% no regional WMA. Mild LA dilation  . Left heart catheterization with coronary angiogram N/A 06/11/2011    Procedure: LEFT HEART CATHETERIZATION WITH CORONARY ANGIOGRAM;  Surgeon: Fulton Reek, MD;  Location: [facility] CATH LAB;  Service: Cardiovascular;  Laterality: N/A;  . Percutaneous coronary stent intervention (pci-s)  06/11/2011    Procedure: PERCUTANEOUS CORONARY STENT INTERVENTION (PCI-S);  Surgeon: Fulton Reek, MD;  Location: [facility] CATH LAB;  Service: Cardiovascular;;  . Left heart catheterization with coronary angiogram N/A 06/28/2013    Procedure: LEFT HEART CATHETERIZATION WITH CORONARY ANGIOGRAM;  Surgeon: Leonie Man, MD;  Location: [facility] CATH LAB;  Service: Cardiovascular;  Laterality: N/A;  . Coronary artery bypass graft N/A 07/01/2013    Procedure: CORONARY ARTERY BYPASS GRAFTING (CABG);  Surgeon: Grace Isaac, MD;  Location: [place];  Service: Open Heart Surgery;  Laterality: N/A;  Times 3 using left internal mammary artery and endoscopically harvested bilateral saphenous vein  . Left and right heart catheterization with coronary/graft angiogram N/A 10/03/2014    Procedure: LEFT AND RIGHT HEART CATHETERIZATION WITH Beatrix Fetters;  Surgeon: Leonie Man, MD;  Location: [facility] CATH LAB;  Service: Cardiovascular;  Laterality: N/A;    FAMHx: Family History  Problem Relation Age of Onset  . Cancer Sister     Lung cancer  . Cancer Mother   . Cancer Father   . Hyperlipidemia Father   . Hypertension Father   . Cancer  Maternal Grandmother   . Cancer Paternal Grandfather     SOCHx:  reports that he quit smoking about 17 years ago. His smoking use included Cigarettes. He has a 54 pack-year smoking history. He has never used smokeless tobacco. He reports that he drinks alcohol. He reports that he does not use illicit drugs.  ALLERGIES: Allergies  Allergen Reactions  . Atorvastatin Other (See Comments)    Leg pain  . Ibuprofen Hives  . Pravastatin   . Simvastatin Other (See Comments)    Leg pain    HOME MEDICATIONS: Facility-administered medications prior to admission  Medication Dose Route Frequency Provider Last Rate Last Dose  . 0.9 %  sodium chloride infusion   Intravenous Continuous Leonie Man, MD       Prescriptions prior to admission  Medication Sig Dispense Refill Last Dose  . acetaminophen (TYLENOL) 325 MG tablet Take 650 mg by mouth every 4 (four) hours as needed for mild pain.    Past Month at Unknown time  . amLODipine (NORVASC) 10 MG tablet TAKE 1 TABLET  ONCE DAILY. 30 tablet 9 10/11/2014 at Unknown time  . aspirin EC 325 MG EC tablet Take 1 tablet (325 mg total) by mouth daily. 30 tablet 0 10/11/2014 at Unknown time  . B-D ULTRAFINE III SHORT PEN 31G X 8 MM MISC See admin instructions.  0 10/11/2014 at Unknown time  . carvedilol (COREG) 12.5 MG tablet Take 1 tablet (12.5 mg total) by mouth 2 (two) times daily. 60 tablet 11 10/11/2014 at 0800  . clopidogrel (PLAVIX) 75 MG tablet Take 1 tablet (75 mg total) by mouth daily with breakfast. 30 tablet 11 10/11/2014 at Unknown time  . fenofibrate 160 MG tablet Take 160 mg by mouth daily.   10/11/2014 at Unknown time  . furosemide (LASIX) 20 MG tablet Take 1 tablet (20 mg total) by mouth daily. 30 tablet 11 10/11/2014 at Unknown time  . HUMALOG KWIKPEN 100 UNIT/ML SOPN Inject 10-15 Units into the skin 3 (three) times daily. Sliding scale. 10 units with breakfast and 15 units with lunch and supper   10/11/2014 at 2200  . hydrALAZINE (APRESOLINE)  100 MG tablet Take 1 tablet (100 mg total) by mouth 2 (two) times daily. 60 tablet 11 10/11/2014 at Unknown time  . insulin glargine (LANTUS) 100 UNIT/ML injection Inject 40-70 Units into the skin 2 (two) times daily. 40 units in am, 70 units in pm   10/11/2014 at Unknown time  . isosorbide mononitrate (IMDUR) 60 MG 24 hr tablet Take 1 tablet (60 mg total) by mouth daily. 30 tablet 11 10/11/2014 at Unknown time  . nitroGLYCERIN (NITROSTAT) 0.4 MG SL tablet Place 1 tablet (0.4 mg total) under the tongue every 5 (five) minutes as needed for chest pain. 90 tablet 3 never  . pantoprazole (PROTONIX) 40 MG tablet Take 40 mg by mouth daily.   10/11/2014 at Unknown time  . rosuvastatin (CRESTOR) 10 MG tablet Take 1 tablet (10 mg total) by mouth every other day. 15 tablet 11 10/11/2014 at Unknown time    PHYSICAL EXAM:Blood pressure 172/47, pulse 57, temperature 97.6 F (36.4 C), temperature source Oral, resp. rate 18, height 5' 8.5" (1.74 m), weight 210 lb (95.255 kg), SpO2 95 %. General appearance: alert, cooperative, appears stated age, no distress and Well-nourished, and well groomed. Neck: no adenopathy, no carotid bruit, no JVD and supple, symmetrical, trachea midline Lungs: CTA B., normal percussion bilaterally and Nonlabored, good air movement Heart: RRR S1, S2 normal, no murmur, click, rub or gallop, normal apical impulse and Mild discomfort left precordial wall and along the sternum. Well-healed scar. Abdomen: soft, non-tender; bowel sounds normal; no masses, no organomegaly and Rotund abdomen Extremities: 1-2++ bilaterally. and no ulcers, gangrene or trophic changes Pulses: 2+ and symmetric Neurologic: Grossly normal  IMPRESSION & PLAN  Lance Collier has presented today for surgery, with the diagnosis of CLASS III ANGINA WITH KNOWN SCG OF SVG.  The various methods of treatment have been discussed with the patient and family.   Risks / Complications include, but not limited to: Death, MI,  CVA/TIA, VF/VT (with defibrillation), Bradycardia (need for temporary pacer placement), contrast induced nephropathy - at increased risk with baseline CKD, bleeding / bruising / hematoma / pseudoaneurysm, vascular or coronary injury (with possible emergent CT or Vascular Surgery), adverse medication reactions, infection.   The patients' history has been reviewed, patient examined, no change in status from most recent note, stable for surgery. I have reviewed the patients' chart and labs. Questions were answered to the patient's satisfaction.  Additional increased risk  of vein graft PCI was also discussed.  After consideration of risks, benefits and other options for treatment, the patient has consented to Procedure(s):  PERCUTANEOUS CORONARY INTERVENTION OF OSTIAL-PROXIMAL SVG-RCA  as a surgical intervention.   We will proceed with the planned procedure.   HARDING, [facility] [place]. [place], [place]  [address]  [phone]  10/12/2014 1:08 PM

## 2014-10-12 NOTE — Progress Notes (Signed)
Site area: right groin  Site Prior to Removal:  Level 0  Pressure Applied For 20 MINUTES    Minutes Beginning at 1720  Manual:   Yes.    Patient Status During Pull:  stable  Post Pull Groin Site:  Level 0  Post Pull Instructions Given:  Yes.    Post Pull Pulses Present:  Yes.    Dressing Applied:  Yes.    Comments:  Post pull instructions given. Pt verbalized understanding.

## 2014-10-13 ENCOUNTER — Other Ambulatory Visit: Payer: Self-pay | Admitting: Nurse Practitioner

## 2014-10-13 ENCOUNTER — Encounter (HOSPITAL_COMMUNITY): Payer: Self-pay | Admitting: Nurse Practitioner

## 2014-10-13 DIAGNOSIS — N183 Chronic kidney disease, stage 3 unspecified: Secondary | ICD-10-CM

## 2014-10-13 DIAGNOSIS — E780 Pure hypercholesterolemia: Secondary | ICD-10-CM | POA: Diagnosis not present

## 2014-10-13 DIAGNOSIS — N184 Chronic kidney disease, stage 4 (severe): Secondary | ICD-10-CM

## 2014-10-13 DIAGNOSIS — F419 Anxiety disorder, unspecified: Secondary | ICD-10-CM | POA: Diagnosis not present

## 2014-10-13 DIAGNOSIS — I208 Other forms of angina pectoris: Secondary | ICD-10-CM | POA: Diagnosis not present

## 2014-10-13 DIAGNOSIS — I358 Other nonrheumatic aortic valve disorders: Secondary | ICD-10-CM | POA: Diagnosis not present

## 2014-10-13 DIAGNOSIS — I739 Peripheral vascular disease, unspecified: Secondary | ICD-10-CM | POA: Diagnosis not present

## 2014-10-13 DIAGNOSIS — I25720 Atherosclerosis of autologous artery coronary artery bypass graft(s) with unstable angina pectoris: Secondary | ICD-10-CM | POA: Diagnosis not present

## 2014-10-13 DIAGNOSIS — K219 Gastro-esophageal reflux disease without esophagitis: Secondary | ICD-10-CM | POA: Diagnosis not present

## 2014-10-13 LAB — BASIC METABOLIC PANEL
ANION GAP: 8 (ref 5–15)
BUN: 26 mg/dL — AB (ref 6–23)
CHLORIDE: 106 mmol/L (ref 96–112)
CO2: 25 mmol/L (ref 19–32)
CREATININE: 1.92 mg/dL — AB (ref 0.50–1.35)
Calcium: 8.7 mg/dL (ref 8.4–10.5)
GFR calc Af Amer: 39 mL/min — ABNORMAL LOW (ref >90)
GFR calc non Af Amer: 34 mL/min — ABNORMAL LOW (ref >90)
Glucose, Bld: 89 mg/dL (ref 70–99)
Potassium: 4 mmol/L (ref 3.5–5.1)
SODIUM: 139 mmol/L (ref 135–145)

## 2014-10-13 LAB — CBC
HCT: 30.1 % — ABNORMAL LOW (ref 39.0–52.0)
HEMOGLOBIN: 9.6 g/dL — AB (ref 13.0–17.0)
MCH: 27 pg (ref 26.0–34.0)
MCHC: 31.9 g/dL (ref 30.0–36.0)
MCV: 84.8 fL (ref 78.0–100.0)
PLATELETS: 241 10*3/uL (ref 150–400)
RBC: 3.55 MIL/uL — AB (ref 4.22–5.81)
RDW: 13.7 % (ref 11.5–15.5)
WBC: 5.3 10*3/uL (ref 4.0–10.5)

## 2014-10-13 LAB — GLUCOSE, CAPILLARY: Glucose-Capillary: 85 mg/dL (ref 70–99)

## 2014-10-13 MED ORDER — ASPIRIN EC 81 MG PO TBEC: 81.0000 mg | DELAYED_RELEASE_TABLET | Freq: Every day | ORAL | Status: DC

## 2014-10-13 NOTE — Progress Notes (Signed)
Patient Name: Lance Collier Date of Encounter: 10/13/2014   Principal Problem:   Angina, class III Active Problems:   DOE (dyspnea on exertion)   Coronary artery disease involving coronary bypass graft with unstable angina pectoris   Diabetes mellitus   Essential hypertension   Obesity (BMI 30-39.9)   CKD (chronic kidney disease), stage III   Hyperlipidemia with target LDL less than 70; intolerance to atorvastatin and simvastatin   PAD (peripheral artery disease) both Rt and Lt disease with Lt ICA of 60-79%    SUBJECTIVE  No chest pain or sob overnight.  Eager to go home.  CURRENT MEDS . amLODipine  10 mg Oral Daily  . aspirin  81 mg Oral Daily  . carvedilol  12.5 mg Oral BID WC  . clopidogrel  75 mg Oral Q breakfast  . fenofibrate  160 mg Oral Daily  . furosemide  20 mg Oral Daily  . hydrALAZINE  100 mg Oral BID  . insulin aspart  10 Units Subcutaneous Q breakfast  . insulin aspart  15 Units Subcutaneous BID AC  . insulin glargine  40 Units Subcutaneous Daily  . insulin glargine  70 Units Subcutaneous QHS  . isosorbide mononitrate  60 mg Oral Daily  . pantoprazole  40 mg Oral Daily  . rosuvastatin  10 mg Oral QODAY  . sodium chloride  3 mL Intravenous Q12H    OBJECTIVE  Filed Vitals:   10/12/14 2106 10/12/14 2309 10/13/14 0513 10/13/14 0734  BP: 137/45 145/45 154/48 146/71  Pulse: 51 52 58 83  Temp: 98.1 F (36.7 C) 97.2 F (36.2 C) 98.2 F (36.8 C)   TempSrc: Oral Oral Oral   Resp: 18 18 18 16   Height:      Weight:   212 lb 11.9 oz (96.5 kg)   SpO2: 92% 95% 96%     Intake/Output Summary (Last 24 hours) at 10/13/14 0736 Last data filed at 10/12/14 1858  Gross per 24 hour  Intake    200 ml  Output      0 ml  Net    200 ml   Filed Weights   10/12/14 0714 10/13/14 0513  Weight: 210 lb (95.255 kg) 212 lb 11.9 oz (96.5 kg)    PHYSICAL EXAM  General: Pleasant, NAD. Neuro: Alert and oriented X 3. Moves all extremities spontaneously. Psych: Normal  affect. HEENT:  Normal  Neck: Supple without bruits or JVD. Lungs:  Resp regular and unlabored, CTA. Heart: RRR no s3, s4.  2/6 SEM LUSB. Abdomen: Soft, non-tender, non-distended, BS + x 4.  Extremities: No clubbing, cyanosis or edema. DP/PT/Radials 2+ and equal bilaterally.  R groin cath site w/o bleeding/bruit/hematoma.  Accessory Clinical Findings  CBC  Recent Labs  10/11/14 1426 10/13/14 0428  WBC 5.5 5.3  HGB 10.3* 9.6*  HCT 31.3* 30.1*  MCV 83.9 84.8  PLT 289 [id number]   Basic Metabolic Panel  Recent Labs  10/11/14 1426 10/13/14 0428  NA 137 139  K 4.8 4.0  CL 105 106  CO2 24 25  GLUCOSE 166* 89  BUN 31* 26*  CREATININE 2.15* 1.92*  CALCIUM 8.8 8.7   Thyroid Function Tests  Recent Labs  10/11/14 1426  TSH 6.428*    TELE  Rsr/sinus brady.  ECG  Sinus brady, 60, no acute st/t changes.  Radiology/Studies  No results found.  ASSESSMENT AND PLAN  1.  USA/CAD:  S/p successful PCI/DES to the VG RPDA yesterday.  No chest pain or dyspnea  overnight.  Ambulate this AM.  Cont asa, plavix, bb, nitrate, statin.  Plan d/c this AM and f/u w/in 2 wks.  2.  HTN:  BP trending moderately high this am. F/U after AM meds.  Cont bb, ccb, nitrate.  3.  HL:  LDL 98 on 3/30 w/ nl lft's.  Cont low dose crestor - intolerant to multiple other statins.  4.  Type II DM:  Gluc 89 this AM. Cont home insulin regimen.  5.  CKD III:  Creat improved/stable following hydration last night.  F/U bmet as outpt in 1 wk.  6.  Morbid Obesity:  Aggressive dietary and lifestyle modification will need to be part of his outpt f/u and routine.  Signed, [name] NP   Patient seen, examined. Available data reviewed. Agree with findings, assessment, and plan as outlined by Ignacia Bayley, NP. The patient is independently interviewed and examined. Lungs are clear. Heart is regular rate and rhythm. There is no peripheral edema. The patient appears to be stable after staged PCI of the  saphenous vein graft to right PDA. He had no symptoms with walking this morning. He is ready for discharge on his current medical therapy. Follow-up as outlined above.  Sherren Mocha, M.D. 10/13/2014 9:26 AM

## 2014-10-13 NOTE — Discharge Instructions (Signed)
**  PLEASE REMEMBER TO BRING ALL OF YOUR MEDICATIONS TO EACH OF YOUR FOLLOW-UP OFFICE VISITS. ° °NO HEAVY LIFTING OR SEXUAL ACTIVITY X 7 DAYS. °NO DRIVING X 3-5 DAYS. °NO SOAKING BATHS, HOT TUBS, POOLS, ETC., X 7 DAYS. ° °Groin Site Care °Refer to this sheet in the next few weeks. These instructions provide you with information on caring for yourself after your procedure. Your caregiver may also give you more specific instructions. Your treatment has been planned according to current medical practices, but problems sometimes occur. Call your caregiver if you have any problems or questions after your procedure. °HOME CARE INSTRUCTIONS °· You may shower 24 hours after the procedure. Remove the bandage (dressing) and gently wash the site with plain soap and water. Gently pat the site dry.  °· Do not apply powder or lotion to the site.  °· Do not sit in a bathtub, swimming pool, or whirlpool for 5 to 7 days.  °· No bending, squatting, or lifting anything over 10 pounds (4.5 kg) as directed by your caregiver.  °· Inspect the site at least twice daily.  °· Do not drive home if you are discharged the same day of the procedure. Have someone else drive you.  ° °What to expect: °· Any bruising will usually fade within 1 to 2 weeks.  °· Blood that collects in the tissue (hematoma) may be painful to the touch. It should usually decrease in size and tenderness within 1 to 2 weeks.  °SEEK IMMEDIATE MEDICAL CARE IF: °· You have unusual pain at the groin site or down the affected leg.  °· You have redness, warmth, swelling, or pain at the groin site.  °· You have drainage (other than a small amount of blood on the dressing).  °· You have chills.  °· You have a fever or persistent symptoms for more than 72 hours.  °· You have a fever and your symptoms suddenly get worse.  °· Your leg becomes pale, cool, tingly, or numb.  °You have heavy bleeding from the site. Hold pressure on the site. . ° °

## 2014-10-13 NOTE — Discharge Summary (Signed)
Discharge Summary   Patient ID: Lance Collier,  MRN: [id number], DOB/AGE: 71-May-1947 71 y.o.  Admit date: 10/12/2014 Discharge date: 10/13/2014  Primary Care Provider: Sheela Stack Primary Cardiologist: Roni Bread, MD   Discharge Diagnoses Principal Problem:   Angina, class III  **s/p successful PCI/DES to the VG RPDA this admission.  Active Problems:   DOE (dyspnea on exertion)   Coronary artery disease involving coronary bypass graft with unstable angina pectoris   Diabetes mellitus   Essential hypertension   Obesity (BMI 30-39.9)   CKD (chronic kidney disease), stage III  **Creat stable @ discharge (1.92).   Hyperlipidemia with target LDL less than 70; intolerance to atorvastatin and simvastatin   PAD (peripheral artery disease) both Rt and Lt disease with Lt ICA of 60-79%   Allergies Allergies  Allergen Reactions  . Atorvastatin Other (See Comments)    Leg pain  . Ibuprofen Hives  . Pravastatin   . Simvastatin Other (See Comments)    Leg pain    Procedures  Cardiac Catheterization and Percutaneous Coronary Intervention 4.21.2016  Hemodynamics:    Central Aortic Pressure / Mean: 134/43/70 mmHg  Coronary Anatomy:  SVG-RPDA: There is diffuse proximal irregularities with a focal 80% stenosis in the proximal vessel before the graft then normalizes into a relatively normal caliber vessel that has no further disease as it reaches down to the PDA. No significant disease in the   **The VG RPDA was successfully stented using a 3.0 x 38 mm Xience Alpine DES.** _____________   History of Present Illness  69 year old male with a prior history of coronary artery disease status post coronary artery bypass grafting. He was recently seen in the clinic with progressive exertional chest tightness and dyspnea on exertion. He underwent diagnostic cardiac catheterization on 10/03/2014 revealing an 80% stenosis within the vein graft to the right PDA. Other grafts were  patent. Due to stage III chronic kidney disease, decision was made to pursue PCI of the vein graft to the right PDA in a staged approach. Patient presented back to the cardiac catheterization laboratory on April 21 for PCI.  Hospital Course  Patient underwent diagnostic catheterization on April 21, again revealing a focal 80% stenosis in the proximal vein graft to the RPDA. This was then successfully stented using a 3.0 x 38 mm Xience Alpine drug-eluting stent. The patient tolerated the procedure well and was aggressively hydrated postprocedure. He has had no recurrence of chest discomfort and has been ambulating without difficulty. Creatinine is stable this morning at 1.92. He will be discharged home today in good condition with plan for follow-up basic metabolic panel in 1 week in routine office follow-up within the next 2 weeks.   Discharge Vitals Blood pressure 170/49, pulse 61, temperature 97.9 F (36.6 C), temperature source Oral, resp. rate 18, height 5' 8.5" (1.74 m), weight 212 lb 11.9 oz (96.5 kg), SpO2 95 %.  Filed Weights   10/12/14 0714 10/13/14 0513  Weight: 210 lb (95.255 kg) 212 lb 11.9 oz (96.5 kg)    Labs  CBC  Recent Labs  10/11/14 1426 10/13/14 0428  WBC 5.5 5.3  HGB 10.3* 9.6*  HCT 31.3* 30.1*  MCV 83.9 84.8  PLT 289 [id number]   Basic Metabolic Panel  Recent Labs  10/11/14 1426 10/13/14 0428  NA 137 139  K 4.8 4.0  CL 105 106  CO2 24 25  GLUCOSE 166* 89  BUN 31* 26*  CREATININE 2.15* 1.92*  CALCIUM 8.8 8.7   Thyroid Function  Tests  Recent Labs  10/11/14 1426  TSH 6.428*   Disposition  Pt is being discharged home today in good condition.  Follow-up Plans & Appointments      Follow-up Information    Follow up with [facility] On 10/20/2014.   Why:  basic metabolic panel ordered to follow-up your kidney function.      Follow up with Leonie Man, MD On 10/30/2014.   Specialty:  Cardiology   Why:  2:15 PM   Contact information:    [address] [place] [address] [phone]       Follow up with Sheela Stack, MD.   Specialty:  Endocrinology   Why:  as scheduled.   Contact information:   [address] [place] [address] [phone]       Discharge Medications    Medication List    TAKE these medications        acetaminophen 325 MG tablet  Commonly known as:  TYLENOL  Take 650 mg by mouth every 4 (four) hours as needed for mild pain.     amLODipine 10 MG tablet  Commonly known as:  NORVASC  TAKE 1 TABLET ONCE DAILY.     aspirin EC 81 MG tablet  Take 1 tablet (81 mg total) by mouth daily.     B-D ULTRAFINE III SHORT PEN 31G X 8 MM Misc  Generic drug:  Insulin Pen Needle  See admin instructions.     carvedilol 12.5 MG tablet  Commonly known as:  COREG  Take 1 tablet (12.5 mg total) by mouth 2 (two) times daily.     clopidogrel 75 MG tablet  Commonly known as:  PLAVIX  Take 1 tablet (75 mg total) by mouth daily with breakfast.     fenofibrate 160 MG tablet  Take 160 mg by mouth daily.     furosemide 20 MG tablet  Commonly known as:  LASIX  Take 1 tablet (20 mg total) by mouth daily.     HUMALOG KWIKPEN 100 UNIT/ML KiwkPen  Generic drug:  insulin lispro  Inject 10-15 Units into the skin 3 (three) times daily. Sliding scale. 10 units with breakfast and 15 units with lunch and supper     hydrALAZINE 100 MG tablet  Commonly known as:  APRESOLINE  Take 1 tablet (100 mg total) by mouth 2 (two) times daily.     insulin glargine 100 UNIT/ML injection  Commonly known as:  LANTUS  Inject 40-70 Units into the skin 2 (two) times daily. 40 units in am, 70 units in pm     isosorbide mononitrate 60 MG 24 hr tablet  Commonly known as:  IMDUR  Take 1 tablet (60 mg total) by mouth daily.     nitroGLYCERIN 0.4 MG SL tablet  Commonly known as:  NITROSTAT  Place 1 tablet (0.4 mg total) under the tongue every 5 (five) minutes as needed for chest pain.      pantoprazole 40 MG tablet  Commonly known as:  PROTONIX  Take 40 mg by mouth daily.     rosuvastatin 10 MG tablet  Commonly known as:  CRESTOR  Take 1 tablet (10 mg total) by mouth every other day.       Outstanding Labs/Studies  F/U BMET in 1 week.  Duration of Discharge Encounter   Greater than 30 minutes including physician time.  Signed, [name] NP 10/13/2014, 8:18 AM

## 2014-10-13 NOTE — Progress Notes (Addendum)
CARDIAC REHAB PHASE I   PRE:  Rate/Rhythm:63 SR  BP:  Sitting: 170/49        SaO2: 93 RA  MODE:  Ambulation: 1000 ft   POST:  Rate/Rhythm: 69 SR  BP:  Sitting: 181/48         SaO2: 97 RA  Pt ambulated 1000 ft on RA, handheld assist, steady gait, tolerated well.  Pt c/o of mild DOE and mild pain in legs/thighs, denies cp, dizziness, declined rest stop. Pt BP elevated 181/48, Dr. Burt Knack in room, notified. Completed stent education. Reviewed anti-platelet therapy, stent card, activity restrictions, ntg, exercise, heart healthy diet, carb counting, portion control, phase 2 cardiac rehab. Pt verbalized understanding. Pt wife at bedside. Pt agrees to phase 2 cardiac rehab. Will send referral to [facility].  Pt has been to [facility] twice in the past. Pt returned to chair with call bell within reach.   IT:[id number]    Lenna Sciara, RN, BSN 10/13/2014 9:26 AM

## 2014-10-30 ENCOUNTER — Encounter: Payer: Self-pay | Admitting: Cardiology

## 2014-10-30 ENCOUNTER — Ambulatory Visit (INDEPENDENT_AMBULATORY_CARE_PROVIDER_SITE_OTHER): Payer: Medicare Other | Admitting: Cardiology

## 2014-10-30 VITALS — BP 130/62 | HR 56 | Ht 68.5 in | Wt 219.7 lb

## 2014-10-30 DIAGNOSIS — R0600 Dyspnea, unspecified: Secondary | ICD-10-CM

## 2014-10-30 DIAGNOSIS — I25700 Atherosclerosis of coronary artery bypass graft(s), unspecified, with unstable angina pectoris: Secondary | ICD-10-CM

## 2014-10-30 DIAGNOSIS — I10 Essential (primary) hypertension: Secondary | ICD-10-CM | POA: Diagnosis not present

## 2014-10-30 DIAGNOSIS — I5032 Chronic diastolic (congestive) heart failure: Secondary | ICD-10-CM

## 2014-10-30 DIAGNOSIS — I2510 Atherosclerotic heart disease of native coronary artery without angina pectoris: Secondary | ICD-10-CM

## 2014-10-30 DIAGNOSIS — E785 Hyperlipidemia, unspecified: Secondary | ICD-10-CM | POA: Diagnosis not present

## 2014-10-30 DIAGNOSIS — I209 Angina pectoris, unspecified: Secondary | ICD-10-CM

## 2014-10-30 DIAGNOSIS — R0609 Other forms of dyspnea: Secondary | ICD-10-CM

## 2014-10-30 DIAGNOSIS — R9431 Abnormal electrocardiogram [ECG] [EKG]: Secondary | ICD-10-CM

## 2014-10-30 DIAGNOSIS — E669 Obesity, unspecified: Secondary | ICD-10-CM

## 2014-10-30 DIAGNOSIS — Z9861 Coronary angioplasty status: Secondary | ICD-10-CM

## 2014-10-30 DIAGNOSIS — I208 Other forms of angina pectoris: Secondary | ICD-10-CM

## 2014-10-30 MED ORDER — FUROSEMIDE 40 MG PO TABS: 40.0000 mg | ORAL_TABLET | Freq: Every day | ORAL | Status: DC

## 2014-10-30 NOTE — Patient Instructions (Signed)
LASIX-----TAKE 40 MG IN MORNING AND 20 MG IN THE EVENING UNTIL Friday AFTER FRDAY START 40 MG DAILY.  IMDUR 60 MG  TWICE A DAY UNTIL Friday  SCHEDULE THIS Friday 11/03/14--Your physician has requested that you have a lexiscan myoview. For further information please visit [email or web address]. Please follow instruction sheet, as given. LABS THIS Friday--- bmp,bnp

## 2014-10-30 NOTE — Progress Notes (Signed)
PCP: Sheela Stack, MD  Clinic Note: Chief Complaint  Patient presents with  . Hospitalization Follow-up    patient reports shortness of breath with minimal exertion  . Coronary Artery Disease    Status post PCI to SVG to RCA  . Shortness of Breath    HPI: Lance Collier is a 69 y.o. male with a PMH below who presents today for initial followup after stage PCI of the SVG-RCA back on April 21. He initially had diagnostic catheterization on April 12, and was brought back for precatheterization with staged PCI of the ostial/proximal SVG-RCA. Believed to  Past Medical History  Diagnosis Date  . CAD S/P percutaneous coronary angioplasty 06/12/2011; 09/2104    a) 2012: Complex PCI mRCA ([place]) --  2 overlapping Promus element DES stents.; 09/2014:  ost-prox SVG-rPDA - Xience DES 3.0 mm x 38 mm; (3.4  - 3.3 mm)  . 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  Likely thromboembolic event with PDA occlusion. 06/12/2011    Post Complex PCI  . 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 07/01/2013 (Cath for Crescendo Unstable Angina) --> [name]: For LM disease; LIMA-LAD, SVG-RCA, SCG-Cx; b. 09/2014 Cath/Staged PCI: patent LIMA->LAD, VG->OM1/RI, VG->RCA 80% (3.0x38 Xience Alpine DES on 10/12/2014).  Marland Kitchen CAD (coronary artery disease), autologous vein bypass graft 09/2014    10/03/14: Cath for Class III-IV Angina -- 80% ostial-proximal SVG-RPDA (minimal flow down native PDA from native RCA with 50% proximal and distal, patent LIMA-LAD, SVG-OM with retrograde filling of OM 2. --> 10/12/14: Staged PCI of SVG-RPDA - Xience DES  3.0 mm x 38 mm; (3.4  - 3.3 mm)  . Aortic sclerosis  - without Stenosis  March 2014    Echo: Aortic sclerosis without stenosis. EF 55-60% with normal WM; mild concentric hypertrophy with normal relaxation.  Marland Kitchen PAD (peripheral artery disease)     with claudication  . High cholesterol   . Hypertension   . GERD (gastroesophageal reflux  disease)   . Anxiety   . Complication of anesthesia     "I've had name recall recognition problems since my last anesthesia"  . Heart murmur   . Childhood asthma   . DM (diabetes mellitus) type II uncontrolled with eye manifestation 2013    Diabetic retinopathy with retinal hemorrhages since;, recurrent in August 2014 treated with Avastin shots  . Migraine ~ 2004    "once"  . CKD (chronic kidney disease), stage III     "mild; related to diabetes" (10/02/2014)  . Anemia     Prior Cardiac Evaluation and Past Surgical History: Past Surgical History  Procedure Laterality Date  . Tonsillectomy    . Nm myoview ltd  January '13    Diaphragmatic attenuation versus inferior infarct. No ischemia. Normal EF normal wall motion.  Marland Kitchen Nm myoview ltd  June 2014    no ischemia  . Coronary angioplasty with stent placement Right September 2012    2 overlapping Promus DES 2.7 mm at 12 mm x2; postdilated to 3 mm  . Cardiac catheterization  06/28/2013    ostial LM disese, RCA ~40-50%ISR  . Retinal laser procedure Bilateral     "more than once"   . Intraoperative transesophageal echocardiogram N/A 07/01/2013    Procedure: INTRAOPERATIVE TRANSESOPHAGEAL ECHOCARDIOGRAM;  Surgeon: Grace Isaac, MD;  Location: [facility];  Service: Open Heart Surgery;  Laterality: N/A;  . Transthoracic echocardiogram  06/29/2013    Focal basal hypertrophy with  normal LV size. EF 60-65% no regional WMA. Mild LA dilation  . Left heart catheterization with coronary angiogram N/A 06/11/2011    Procedure: LEFT HEART CATHETERIZATION WITH CORONARY ANGIOGRAM;  Surgeon: Fulton Reek, MD;  Location: [facility] CATH LAB;  Service: Cardiovascular;  Laterality: N/A;  . Percutaneous coronary stent intervention (pci-s)  06/11/2011    Procedure: PERCUTANEOUS CORONARY STENT INTERVENTION (PCI-S);  Surgeon: Fulton Reek, MD;  Location: [facility] CATH LAB;  Service: Cardiovascular;;  . Left heart catheterization with coronary angiogram N/A 06/28/2013     Procedure: LEFT HEART CATHETERIZATION WITH CORONARY ANGIOGRAM;  Surgeon: Leonie Man, MD;  Location: [facility] CATH LAB;  Service: Cardiovascular;  Laterality: N/A;  . Coronary artery bypass graft N/A 07/01/2013    Procedure: CORONARY ARTERY BYPASS GRAFTING (CABG);  Surgeon: Grace Isaac, MD;  Location: [facility];  Service: Open Heart Surgery;  Laterality: N/A;  Times 3 using left internal mammary artery and endoscopically harvested bilateral saphenous vein  . Left and right heart catheterization with coronary/graft angiogram N/A 10/03/2014    Procedure: LEFT AND RIGHT HEART CATHETERIZATION WITH Beatrix Fetters;  Surgeon: Leonie Man, MD;  Location: [facility] CATH LAB;  Service: Cardiovascular;  Laterality: N/A;  . Percutaneous coronary stent intervention (pci-s) N/A 10/12/2014    Procedure: PERCUTANEOUS CORONARY STENT INTERVENTION (PCI-S);  Surgeon: Leonie Man, MD;  Location: [facility] CATH LAB;  Ostial-Prox SVG-RCA Xience Alpine DES 3.0 mm x 38 mm; (3.4  - 3.3 mm)    Interval History: unfortunately, [name] has not had any resolution of his symptoms. He still notes exertional dyspnea and fatigue. He tried to mow a lawn that was only about 50 feet long, and to only make it down but not black. He has not noted much in the way of chest tightness or pressure on, but is extremely dyspneic. He still has edema up to his knees but originally got better but has since gone back to what was before. Interestingly, he did note initially after his RCA stent back in September 2012, it took about a month or so for him this feeling back to normal again.  He does note having some orthopnea along with hisnotable lower edema. Did not describe any symptoms consistent with PND. No palpitations, lightheadedness, dizziness, weakness or syncope/near syncope. No TIA/amaurosis fugax symptoms. No melena, hematochezia, hematuria, or epstaxis. A lot of his walking is also very much limited by claudication. Recommendation Robaxin  surgeries to continue to walk in order to promote collateralization.  ROS: A comprehensive was performed. Review of Systems  Constitutional: Positive for malaise/fatigue (Decreased exercise tolerance).  Eyes:       He does have poor vision associated with diabetes  Respiratory: Positive for shortness of breath.   Cardiovascular: Positive for chest pain (Less prominent), orthopnea, claudication and leg swelling.  Musculoskeletal: Positive for joint pain.  Neurological: Positive for dizziness (When he exerts himself). Negative for headaches.  Psychiatric/Behavioral: Positive for depression (he seems quite demoralized.).  All other systems reviewed and are negative.   Current Outpatient Prescriptions on File Prior to Visit  Medication Sig Dispense Refill  . acetaminophen (TYLENOL) 325 MG tablet Take 650 mg by mouth every 4 (four) hours as needed for mild pain.     Marland Kitchen amLODipine (NORVASC) 10 MG tablet TAKE 1 TABLET ONCE DAILY. 30 tablet 9  . aspirin 81 MG tablet Take 1 tablet (81 mg total) by mouth daily.    . B-D ULTRAFINE III SHORT PEN 31G X 8 MM MISC See  admin instructions.  0  . carvedilol (COREG) 12.5 MG tablet Take 1 tablet (12.5 mg total) by mouth 2 (two) times daily. 60 tablet 11  . clopidogrel (PLAVIX) 75 MG tablet Take 1 tablet (75 mg total) by mouth daily with breakfast. 30 tablet 11  . fenofibrate 160 MG tablet Take 160 mg by mouth daily.    Marland Kitchen HUMALOG KWIKPEN 100 UNIT/ML SOPN Inject 10-15 Units into the skin 3 (three) times daily. Sliding scale. 10 units with breakfast and 15 units with lunch and supper    . hydrALAZINE (APRESOLINE) 100 MG tablet Take 1 tablet (100 mg total) by mouth 2 (two) times daily. 60 tablet 11  . insulin glargine (LANTUS) 100 UNIT/ML injection Inject 40-70 Units into the skin 2 (two) times daily. 40 units in am, 70 units in pm    . isosorbide mononitrate (IMDUR) 60 MG 24 hr tablet Take 1 tablet (60 mg total) by mouth daily. 30 tablet 11  . nitroGLYCERIN  (NITROSTAT) 0.4 MG SL tablet Place 1 tablet (0.4 mg total) under the tongue every 5 (five) minutes as needed for chest pain. 90 tablet 3  . pantoprazole (PROTONIX) 40 MG tablet Take 40 mg by mouth daily.    . rosuvastatin (CRESTOR) 10 MG tablet Take 1 tablet (10 mg total) by mouth every other day. 15 tablet 11   Current Facility-Administered Medications on File Prior to Visit  Medication Dose Route Frequency Provider Last Rate Last Dose  . 0.9 %  sodium chloride infusion   Intravenous Continuous Leonie Man, MD       Allergies  Allergen Reactions  . Atorvastatin Other (See Comments)    Leg pain  . Ibuprofen Hives  . Pravastatin   . Simvastatin Other (See Comments)    Leg pain     History  Substance Use Topics  . Smoking status: Former Smoker -- 2.00 packs/day for 27 years    Types: Cigarettes    Quit date: 11/23/1996  . Smokeless tobacco: Never Used  . Alcohol Use: 0.0 oz/week     Comment: 10/02/2014  "some weeks I might have 2-3 drinks then go for a month and not have any"   Family History  Problem Relation Age of Onset  . Cancer Sister     Lung cancer  . Cancer Mother   . Cancer Father   . Hyperlipidemia Father   . Hypertension Father   . Cancer Maternal Grandmother   . Cancer Paternal Grandfather     Wt Readings from Last 3 Encounters:  10/30/14 99.655 kg (219 lb 11.2 oz)  10/13/14 96.5 kg (212 lb 11.9 oz)  10/02/14 96.4 kg (212 lb 8.4 oz)    PHYSICAL EXAM BP 130/62 mmHg  Pulse 56  Ht 5' 8.5" (1.74 m)  Wt 99.655 kg (219 lb 11.2 oz)  BMI 32.92 kg/m2 General appearance: alert, cooperative, appears stated age, no distress and Well-nourished, and well groomed. HEENT: [place]/AT, EOMI, MMM, anicteric sclera Neck: no adenopathy, no carotid bruit, mild JVD (with HJR) and supple, symmetrical, trachea midline Lungs: CTA B., normal percussion bilaterally and Nonlabored, good air movement - no obvious rales Heart: RRR,  S1& S2 normal, no murmur, click, rub or gallop,  normal apical impulse and Mild discomfort left precordial wall and along the sternum. Well-healed scar. Abdomen: soft, non-tender; bowel sounds normal; no masses, no organomegaly and Rotund abdomen Extremities: 1-2++ bilaterally. and no ulcers, gangrene or trophic changes Pulses: mildly diminished pedal pulses bilaterally but 2+ bounding radial pulses  bilaterally Neurologic: Grossly normal; cranial nerves grossly intact    Adult ECG Report  Rate: 56 ;  Rhythm: sinus bradycardia and LVH by voltage. Nonspecific ST and T wave abnormalities. Normal axis, intervals. Otherwise normal EKG  Narrative Interpretation: relatively normal EKG  Recent Labs:      Chemistry      Component Value Date/Time   NA 139 10/13/2014 0428   K 4.0 10/13/2014 0428   CL 106 10/13/2014 0428   CO2 25 10/13/2014 0428   BUN 26* 10/13/2014 0428   CREATININE 1.92* 10/13/2014 0428   CREATININE 2.15* 10/11/2014 1426      Component Value Date/Time   CALCIUM 8.7 10/13/2014 0428   ALKPHOS 55 09/21/2014 1100   AST 29 09/21/2014 1100   ALT 40 09/21/2014 1100   BILITOT 0.3 09/21/2014 1100     Lab Results  Component Value Date   CHOL 193 09/20/2014   HDL 22* 09/20/2014   LDLCALC 98 09/20/2014   TRIG 367* 09/20/2014   CHOLHDL 8.8 09/20/2014    ASSESSMENT / PLAN: Problem List Items Addressed This Visit    Abnormal EKG   Angina, class III - Primary    He still has exertional dyspnea, but not a lot of pressure. Still not concerned we have fully treated everything despite performed PCI on the most notable potential target. In order to direct therapy in the right direction, we'll perform Lexiscan Myoview to assess for any new ischemia.      Relevant Medications   furosemide (LASIX) 40 MG tablet   Other Relevant Orders   EKG 12-Lead (Completed)   Myocardial Perfusion Imaging   B Nat Peptide   Basic metabolic panel   CAD S/P PCI SVG-RCA Xience Alpine DES 3.0 mm x 38 mm (ostial-prox) - 3.78mm (Chronic)    He is  on aspirin plus Plavix and no bleeding issues. He is also taking Crestor every other day along with fenofibrate . On Imdur currently at 57. May need to consider Ranexa      Relevant Medications   furosemide (LASIX) 40 MG tablet   Chronic diastolic CHF (congestive heart failure), NYHA class 3 (Chronic)    Despite not here although exam, he does have symptoms of orthopnea and edema. His wedge pressure was in the high 20s and LVEDP was also a 30 mmHg. See discussion for exertional dyspnea. He did press additional afterload reduction and diuresis. She consider use of ARB. Hydralazine was at maximum dose for effectiveness.probably cannot increase his carvedilol more because of his bradycardia.      Relevant Medications   furosemide (LASIX) 40 MG tablet   Coronary artery disease involving coronary bypass graft with unstable angina pectoris (Chronic)    Vein graft lesion stented. Excellent angiographic result. Despite 1 PCI of most obvious culprit lesion, he still is having symptoms.      Relevant Medications   furosemide (LASIX) 40 MG tablet   Other Relevant Orders   EKG 12-Lead (Completed)   Myocardial Perfusion Imaging   B Nat Peptide   Basic metabolic panel   DOE (dyspnea on exertion) (Chronic)    I really concerned that the symptom is not any better after doing PCI. I reviewed the films, and there may be other areas that could potentially be ischemic there were none noted initially. I doubt very seriously that this stent is closed. Before we would go back to doing any kind of invasive evaluation, I would like to have some direction to warrant putting him back  through the risk of contrast. Plan: Lexiscan Myoview to assess for any ischemia which would direct further therapy. He did have an elevated LVEDP and wedge pressure on his catheterization --continue work, afterload reduction and diuresis.  LASIX-----TAKE 40 MG IN MORNING AND 20 MG IN THE EVENING UNTIL Friday AFTER FRDAY START 40  MG DAILY - which is an increase from 20 mg daily  IMDUR 60 MG  TWICE A DAY UNTIL Friday  Recheck labs on Friday or Monday to reassess renal function. We'll also check a BNP. If his renal function is stable, we may want to consider ARB or ACE inhibitor despite the fact his creatinine is 1.9. He has no evidence of renal artery stenosis.      Relevant Medications   furosemide (LASIX) 40 MG tablet   Other Relevant Orders   EKG 12-Lead (Completed)   Myocardial Perfusion Imaging   B Nat Peptide   Basic metabolic panel   Essential hypertension (Chronic)    Blood pressure actually looks pretty good, so these more afterload reduction. With the knee and I can't be maybe something we have to choose to do despite his creatinine of roughly 2.      Relevant Medications   furosemide (LASIX) 40 MG tablet   Other Relevant Orders   EKG 12-Lead (Completed)   Myocardial Perfusion Imaging   B Nat Peptide   Basic metabolic panel   Hyperlipidemia with target LDL less than 70; intolerance to atorvastatin and simvastatin (Chronic)    He is taking Crestor every other day. His labs from March are not great. LDL is less than 100 and goal 70. Anterior cruciate ligament is also very low which is a risk factor. Triglycerides elevated. Discussed dietary modification. Worsening of his symptoms he is not active enough to be exercising. I did want him to continue walking. If unable to achieve goal after 6 months therapy, will consider referral for evaluation for Repatha or Praluent. He has carotid, coronary and peripheral arterial disease.      Relevant Medications   furosemide (LASIX) 40 MG tablet   Other Relevant Orders   EKG 12-Lead (Completed)   Myocardial Perfusion Imaging   B Nat Peptide   Basic metabolic panel   Obesity (BMI 30-39.9) (Chronic)   Relevant Medications   furosemide (LASIX) 40 MG tablet   Other Relevant Orders   EKG 12-Lead (Completed)   Myocardial Perfusion Imaging   B Nat Peptide    Basic metabolic panel     LASIX-----TAKE 40 MG IN MORNING AND 20 MG IN THE EVENING UNTIL Friday AFTER FRDAY START 40 MG DAILY.  IMDUR 60 MG  TWICE A DAY UNTIL Friday  SCHEDULE THIS Friday 11/03/14--Your physician has requested that you have a lexiscan myoview. For further information please visit [email or web address]. Please follow instruction sheet, as given. LABS THIS Friday--- bmp,bnp   Meds ordered this encounter  Medications  . furosemide (LASIX) 40 MG tablet    Sig: Take 1 tablet (40 mg total) by mouth daily.    Dispense:  30 tablet    Refill:  11     Followup: Pending resulst of Myoview - ~4-6 weeks    HARDING, Leonie Green, M.D., M.S. Interventional Cardiologist   Pager # [phone]

## 2014-11-01 ENCOUNTER — Other Ambulatory Visit: Payer: Self-pay | Admitting: Cardiology

## 2014-11-01 ENCOUNTER — Encounter: Payer: Self-pay | Admitting: Cardiology

## 2014-11-01 ENCOUNTER — Telehealth (HOSPITAL_COMMUNITY): Payer: Self-pay

## 2014-11-01 DIAGNOSIS — E1039 Type 1 diabetes mellitus with other diabetic ophthalmic complication: Secondary | ICD-10-CM | POA: Diagnosis not present

## 2014-11-01 DIAGNOSIS — E11359 Type 2 diabetes mellitus with proliferative diabetic retinopathy without macular edema: Secondary | ICD-10-CM | POA: Diagnosis not present

## 2014-11-01 DIAGNOSIS — H4312 Vitreous hemorrhage, left eye: Secondary | ICD-10-CM | POA: Diagnosis not present

## 2014-11-01 NOTE — Telephone Encounter (Signed)
Rx refill sent to patient pharmacy   

## 2014-11-01 NOTE — Assessment & Plan Note (Signed)
Vein graft lesion stented. Excellent angiographic result. Despite 1 PCI of most obvious culprit lesion, he still is having symptoms.

## 2014-11-01 NOTE — Assessment & Plan Note (Signed)
I really concerned that the symptom is not any better after doing PCI. I reviewed the films, and there may be other areas that could potentially be ischemic there were none noted initially. I doubt very seriously that this stent is closed. Before we would go back to doing any kind of invasive evaluation, I would like to have some direction to warrant putting him back through the risk of contrast. Plan: Lexiscan Myoview to assess for any ischemia which would direct further therapy. He did have an elevated LVEDP and wedge pressure on his catheterization --continue work, afterload reduction and diuresis.  LASIX-----TAKE 40 MG IN MORNING AND 20 MG IN THE EVENING UNTIL Friday AFTER FRDAY START 40 MG DAILY - which is an increase from 20 mg daily  IMDUR 60 MG  TWICE A DAY UNTIL Friday  Recheck labs on Friday or Monday to reassess renal function. We'll also check a BNP. If his renal function is stable, we may want to consider ARB or ACE inhibitor despite the fact his creatinine is 1.9. He has no evidence of renal artery stenosis.

## 2014-11-01 NOTE — Assessment & Plan Note (Signed)
He is on aspirin plus Plavix and no bleeding issues. He is also taking Crestor every other day along with fenofibrate . On Imdur currently at 57. May need to consider Ranexa

## 2014-11-01 NOTE — Assessment & Plan Note (Signed)
Despite not here although exam, he does have symptoms of orthopnea and edema. His wedge pressure was in the high 20s and LVEDP was also a 30 mmHg. See discussion for exertional dyspnea. He did press additional afterload reduction and diuresis. She consider use of ARB. Hydralazine was at maximum dose for effectiveness.probably cannot increase his carvedilol more because of his bradycardia.

## 2014-11-01 NOTE — Assessment & Plan Note (Signed)
He is taking Crestor every other day. His labs from March are not great. LDL is less than 100 and goal 70. Anterior cruciate ligament is also very low which is a risk factor. Triglycerides elevated. Discussed dietary modification. Worsening of his symptoms he is not active enough to be exercising. I did want him to continue walking. If unable to achieve goal after 6 months therapy, will consider referral for evaluation for Repatha or Praluent. He has carotid, coronary and peripheral arterial disease.

## 2014-11-01 NOTE — Telephone Encounter (Signed)
Encounter complete. 

## 2014-11-01 NOTE — Assessment & Plan Note (Signed)
He still has exertional dyspnea, but not a lot of pressure. Still not concerned we have fully treated everything despite performed PCI on the most notable potential target. In order to direct therapy in the right direction, we'll perform Lexiscan Myoview to assess for any new ischemia.

## 2014-11-01 NOTE — Assessment & Plan Note (Signed)
Blood pressure actually looks pretty good, so these more afterload reduction. With the knee and I can't be maybe something we have to choose to do despite his creatinine of roughly 2.

## 2014-11-03 ENCOUNTER — Ambulatory Visit (HOSPITAL_COMMUNITY)
Admission: RE | Admit: 2014-11-03 | Discharge: 2014-11-03 | Disposition: A | Discharge status: Home / Self Care | Payer: Medicare Other | Source: Ambulatory Visit | Attending: Cardiovascular Disease | Admitting: Cardiovascular Disease

## 2014-11-03 DIAGNOSIS — R0609 Other forms of dyspnea: Secondary | ICD-10-CM | POA: Diagnosis not present

## 2014-11-03 DIAGNOSIS — Z951 Presence of aortocoronary bypass graft: Secondary | ICD-10-CM | POA: Insufficient documentation

## 2014-11-03 DIAGNOSIS — I208 Other forms of angina pectoris: Secondary | ICD-10-CM | POA: Diagnosis not present

## 2014-11-03 DIAGNOSIS — Z955 Presence of coronary angioplasty implant and graft: Secondary | ICD-10-CM | POA: Insufficient documentation

## 2014-11-03 DIAGNOSIS — E669 Obesity, unspecified: Secondary | ICD-10-CM | POA: Insufficient documentation

## 2014-11-03 DIAGNOSIS — I209 Angina pectoris, unspecified: Secondary | ICD-10-CM | POA: Diagnosis not present

## 2014-11-03 DIAGNOSIS — I509 Heart failure, unspecified: Secondary | ICD-10-CM | POA: Insufficient documentation

## 2014-11-03 DIAGNOSIS — E119 Type 2 diabetes mellitus without complications: Secondary | ICD-10-CM | POA: Insufficient documentation

## 2014-11-03 DIAGNOSIS — I2510 Atherosclerotic heart disease of native coronary artery without angina pectoris: Secondary | ICD-10-CM | POA: Diagnosis not present

## 2014-11-03 DIAGNOSIS — R42 Dizziness and giddiness: Secondary | ICD-10-CM | POA: Insufficient documentation

## 2014-11-03 DIAGNOSIS — I10 Essential (primary) hypertension: Secondary | ICD-10-CM | POA: Diagnosis not present

## 2014-11-03 DIAGNOSIS — E785 Hyperlipidemia, unspecified: Secondary | ICD-10-CM | POA: Diagnosis not present

## 2014-11-03 DIAGNOSIS — R5383 Other fatigue: Secondary | ICD-10-CM | POA: Diagnosis not present

## 2014-11-03 DIAGNOSIS — N189 Chronic kidney disease, unspecified: Secondary | ICD-10-CM | POA: Insufficient documentation

## 2014-11-03 DIAGNOSIS — Z6832 Body mass index (BMI) 32.0-32.9, adult: Secondary | ICD-10-CM | POA: Insufficient documentation

## 2014-11-03 DIAGNOSIS — J45909 Unspecified asthma, uncomplicated: Secondary | ICD-10-CM | POA: Insufficient documentation

## 2014-11-03 DIAGNOSIS — R0600 Dyspnea, unspecified: Secondary | ICD-10-CM

## 2014-11-03 DIAGNOSIS — I252 Old myocardial infarction: Secondary | ICD-10-CM | POA: Diagnosis not present

## 2014-11-03 DIAGNOSIS — I25700 Atherosclerosis of coronary artery bypass graft(s), unspecified, with unstable angina pectoris: Secondary | ICD-10-CM | POA: Diagnosis not present

## 2014-11-03 DIAGNOSIS — Z48812 Encounter for surgical aftercare following surgery on the circulatory system: Secondary | ICD-10-CM | POA: Insufficient documentation

## 2014-11-03 DIAGNOSIS — I129 Hypertensive chronic kidney disease with stage 1 through stage 4 chronic kidney disease, or unspecified chronic kidney disease: Secondary | ICD-10-CM | POA: Insufficient documentation

## 2014-11-03 LAB — MYOCARDIAL PERFUSION IMAGING
CHL CUP NUCLEAR SRS: 0
CHL CUP STRESS STAGE 1 GRADE: 0 %
CHL CUP STRESS STAGE 1 SPEED: 0 mph
CHL CUP STRESS STAGE 2 HR: 52 {beats}/min
CHL CUP STRESS STAGE 2 SPEED: 0 mph
CHL CUP STRESS STAGE 3 GRADE: 0 %
CHL CUP STRESS STAGE 4 DBP: 61 mmHg
CHL CUP STRESS STAGE 4 HR: 55 {beats}/min
CSEPPMHR: 36 %
Estimated workload: 1 METS
LV dias vol: 156 mL
LV sys vol: 65 mL
Nuc Stress EF: 58 %
Peak HR: 55 {beats}/min
Rest HR: 52 {beats}/min
SDS: 0
SSS: 0
Stage 1 DBP: 64 mmHg
Stage 1 HR: 51 {beats}/min
Stage 1 SBP: 178 mmHg
Stage 2 Grade: 0 %
Stage 3 HR: 55 {beats}/min
Stage 3 Speed: 0 mph
Stage 4 Grade: 0 %
Stage 4 SBP: 159 mmHg
Stage 4 Speed: 0 mph
TID: 0.97

## 2014-11-03 MED ORDER — REGADENOSON 0.4 MG/5ML IV SOLN
0.4000 mg | Freq: Once | INTRAVENOUS | Status: AC
  Administered 2014-11-03: 0.4 mg via INTRAVENOUS

## 2014-11-03 MED ORDER — TECHNETIUM TC 99M SESTAMIBI GENERIC - CARDIOLITE
10.2000 | Freq: Once | INTRAVENOUS | Status: AC | PRN
  Administered 2014-11-03: 10 via INTRAVENOUS

## 2014-11-03 MED ORDER — TECHNETIUM TC 99M SESTAMIBI GENERIC - CARDIOLITE
31.2000 | Freq: Once | INTRAVENOUS | Status: AC | PRN
  Administered 2014-11-03: 31.2 via INTRAVENOUS

## 2014-11-04 LAB — BASIC METABOLIC PANEL
BUN: 31 mg/dL — AB (ref 6–23)
CALCIUM: 9.5 mg/dL (ref 8.4–10.5)
CHLORIDE: 102 meq/L (ref 96–112)
CO2: 27 mEq/L (ref 19–32)
CREATININE: 1.72 mg/dL — AB (ref 0.50–1.35)
GLUCOSE: 210 mg/dL — AB (ref 70–99)
POTASSIUM: 4.7 meq/L (ref 3.5–5.3)
SODIUM: 137 meq/L (ref 135–145)

## 2014-11-04 LAB — BRAIN NATRIURETIC PEPTIDE: Brain Natriuretic Peptide: 92.9 pg/mL (ref 0.0–100.0)

## 2014-11-06 ENCOUNTER — Telehealth: Payer: Self-pay | Admitting: Cardiology

## 2014-11-06 ENCOUNTER — Telehealth: Payer: Self-pay | Admitting: *Deleted

## 2014-11-06 MED ORDER — ASPIRIN EC 81 MG PO TBEC
81.0000 mg | DELAYED_RELEASE_TABLET | ORAL | Status: DC
Start: 2014-11-06 — End: 2020-04-17

## 2014-11-06 NOTE — Telephone Encounter (Signed)
Yes - can reduce to qod

## 2014-11-06 NOTE — Telephone Encounter (Signed)
Spoke with pt, aware of dr harding's recommendations. 

## 2014-11-06 NOTE — Telephone Encounter (Signed)
Yes - ok to reduce ASA to qod

## 2014-11-06 NOTE — Telephone Encounter (Signed)
INFORMED PATINE WILL DISCUSS WITH DR Ellyn Hack AND CALL BACK THIS AFTEROON

## 2014-11-06 NOTE — Telephone Encounter (Signed)
Please call,he need to reduce his aspirin to every other day. His left Retinal is bleeding and his eye doctor think this will help it.

## 2014-11-06 NOTE — Telephone Encounter (Signed)
Spoke to patient. Result given . Verbalized understanding APPOINTMENT NEEDED IN AUG 2016 PATIENT WILL NEED A 30 MIN APPOINTMENT. FORWARD TO SCHEDULERS

## 2014-11-06 NOTE — Telephone Encounter (Signed)
-----   Message from Leonie Man, MD sent at 11/03/2014  5:43 PM EDT ----- It would appear stress test did not show any new areas of concern. Pretty much the same images as last year in June. I would not think that based on this study.  The heart pump function looks pretty normal no evidence of heart attack or areas of the heart not getting enough blood flow. Hopefully, we will start the same time slow response he had after initial catheterization and stent by Dr. Rex Kras.  [place]

## 2014-11-07 NOTE — Telephone Encounter (Signed)
Pt has been contacted and scheduled for 8/26 at 8:30 am

## 2014-11-27 ENCOUNTER — Telehealth: Payer: Self-pay | Admitting: *Deleted

## 2014-11-27 NOTE — Telephone Encounter (Signed)
LATE ENTRY ON 10/30/14 FAXED AND SIGNED CARDIAC REHAB PHASE II

## 2014-11-30 ENCOUNTER — Ambulatory Visit: Payer: Medicare Other | Admitting: Cardiology

## 2014-12-04 ENCOUNTER — Other Ambulatory Visit: Payer: Self-pay | Admitting: Cardiology

## 2014-12-04 NOTE — Telephone Encounter (Signed)
refill 

## 2014-12-05 DIAGNOSIS — N183 Chronic kidney disease, stage 3 (moderate): Secondary | ICD-10-CM | POA: Diagnosis not present

## 2014-12-05 DIAGNOSIS — E119 Type 2 diabetes mellitus without complications: Secondary | ICD-10-CM | POA: Diagnosis not present

## 2014-12-05 DIAGNOSIS — I739 Peripheral vascular disease, unspecified: Secondary | ICD-10-CM | POA: Diagnosis not present

## 2014-12-05 DIAGNOSIS — I10 Essential (primary) hypertension: Secondary | ICD-10-CM | POA: Diagnosis not present

## 2014-12-07 ENCOUNTER — Encounter (HOSPITAL_COMMUNITY)
Admission: RE | Admit: 2014-12-07 | Discharge: 2014-12-07 | Disposition: A | Discharge status: Home / Self Care | Payer: Medicare Other | Source: Ambulatory Visit | Attending: Cardiology | Admitting: Cardiology

## 2014-12-07 ENCOUNTER — Telehealth: Payer: Self-pay | Admitting: Cardiology

## 2014-12-07 DIAGNOSIS — Z955 Presence of coronary angioplasty implant and graft: Secondary | ICD-10-CM | POA: Insufficient documentation

## 2014-12-07 DIAGNOSIS — Z48812 Encounter for surgical aftercare following surgery on the circulatory system: Secondary | ICD-10-CM | POA: Insufficient documentation

## 2014-12-07 DIAGNOSIS — Z79899 Other long term (current) drug therapy: Secondary | ICD-10-CM | POA: Insufficient documentation

## 2014-12-07 NOTE — Telephone Encounter (Signed)
Received records from [facility] for appointment with Dr Ellyn Hack on 02/16/15.  Records given to [facility] (medical records) for Dr Allison Quarry schedule on 02/16/15. lp

## 2014-12-07 NOTE — Progress Notes (Signed)
Cardiac Rehab Medication Review by a Pharmacist  Does the patient  feel that his/her medications are working for him/her?  yes  Has the patient been experiencing any side effects to the medications prescribed?  no  Does the patient measure his/her own blood pressure or blood glucose at home?  yes   Does the patient have any problems obtaining medications due to transportation or finances?   no  Understanding of regimen: poor Understanding of indications: poor Potential of compliance: good    Pharmacist comments: Reviewed instructions for SL NTG.  Pt has been experiencing constipation >> recommended good hydration and increased fiber.  Pt has good compliance except for Crestor which is misses a few times a month.  Pt questions answered.   Drucie Opitz, PharmD Clinical Pharmacy Resident Pager: [phone] 12/07/2014 8:14 AM

## 2014-12-11 ENCOUNTER — Encounter (HOSPITAL_COMMUNITY)
Admission: RE | Admit: 2014-12-11 | Discharge: 2014-12-11 | Disposition: A | Discharge status: Home / Self Care | Payer: Medicare Other | Source: Ambulatory Visit | Attending: Cardiology | Admitting: Cardiology

## 2014-12-11 DIAGNOSIS — Z79899 Other long term (current) drug therapy: Secondary | ICD-10-CM | POA: Diagnosis not present

## 2014-12-11 DIAGNOSIS — Z48812 Encounter for surgical aftercare following surgery on the circulatory system: Secondary | ICD-10-CM | POA: Diagnosis not present

## 2014-12-11 DIAGNOSIS — Z955 Presence of coronary angioplasty implant and graft: Secondary | ICD-10-CM | POA: Diagnosis not present

## 2014-12-11 LAB — GLUCOSE, CAPILLARY
GLUCOSE-CAPILLARY: 202 mg/dL — AB (ref 65–99)
Glucose-Capillary: 137 mg/dL — ABNORMAL HIGH (ref 65–99)

## 2014-12-11 NOTE — Progress Notes (Signed)
Pt  In today for his first day of cardiac rehab phase II today at the 6:45 am class.  Pt tolerated light exercise without difficulty. VSS,  Blood glucose checked pre and post were within normal limits for non fasting,telemetry- Sr with no noted ectopy, asymptomatic.  Medication list reconciled.  Pt verbalized compliance with medications and denies barriers to compliance. PSYCHOSOCIAL ASSESSMENT:  PHQ-0. Pt exhibits positive coping skills, hopeful outlook with supportive family. No psychosocial needs identified at this time, no psychosocial interventions necessary.  Pt is former pt in the cardiac rehab program couple of years ago.  Pt is employed as a Chief Executive Officer full time.  Pt admits his job can be stressful at times but overall he enjoys what he is doing.  Pt remarked that if he were to retire and play golf four times a week he laughs that he would just die!  Pt enjoys playing with grandchildren and reading.   Pt cardiac rehab  goal is  to  Get back to regular exercise. During the time period right after his event pt did not exercise.  He wanted to wait until he started in the CR program.   Pt encouraged to participate in home exercise and educational classes as a review to increase ability to achieve these goals.   Pt long term cardiac rehab goal is decrease pain in legs and sob.  Pt would like to see an improvement in his quality of life and complete activities with ease.  Pt oriented to exercise equipment and routine.  Understanding verbalized. Cherre Huger, BSN

## 2014-12-12 DIAGNOSIS — E11359 Type 2 diabetes mellitus with proliferative diabetic retinopathy without macular edema: Secondary | ICD-10-CM | POA: Diagnosis not present

## 2014-12-12 DIAGNOSIS — H4312 Vitreous hemorrhage, left eye: Secondary | ICD-10-CM | POA: Diagnosis not present

## 2014-12-13 ENCOUNTER — Encounter (HOSPITAL_COMMUNITY)
Admission: RE | Admit: 2014-12-13 | Discharge: 2014-12-13 | Disposition: A | Discharge status: Home / Self Care | Payer: Medicare Other | Source: Ambulatory Visit | Attending: Cardiology | Admitting: Cardiology

## 2014-12-13 DIAGNOSIS — Z48812 Encounter for surgical aftercare following surgery on the circulatory system: Secondary | ICD-10-CM | POA: Diagnosis not present

## 2014-12-13 DIAGNOSIS — Z955 Presence of coronary angioplasty implant and graft: Secondary | ICD-10-CM | POA: Diagnosis not present

## 2014-12-13 LAB — GLUCOSE, CAPILLARY
GLUCOSE-CAPILLARY: 142 mg/dL — AB (ref 65–99)
Glucose-Capillary: 165 mg/dL — ABNORMAL HIGH (ref 65–99)

## 2014-12-15 ENCOUNTER — Encounter (HOSPITAL_COMMUNITY)
Admission: RE | Admit: 2014-12-15 | Discharge: 2014-12-15 | Disposition: A | Discharge status: Home / Self Care | Payer: Medicare Other | Source: Ambulatory Visit | Attending: Cardiology | Admitting: Cardiology

## 2014-12-15 DIAGNOSIS — Z48812 Encounter for surgical aftercare following surgery on the circulatory system: Secondary | ICD-10-CM | POA: Diagnosis not present

## 2014-12-15 DIAGNOSIS — Z955 Presence of coronary angioplasty implant and graft: Secondary | ICD-10-CM | POA: Diagnosis not present

## 2014-12-15 LAB — GLUCOSE, CAPILLARY: GLUCOSE-CAPILLARY: 115 mg/dL — AB (ref 65–99)

## 2014-12-18 ENCOUNTER — Encounter (HOSPITAL_COMMUNITY)
Admission: RE | Admit: 2014-12-18 | Discharge: 2014-12-18 | Disposition: A | Discharge status: Home / Self Care | Payer: Medicare Other | Source: Ambulatory Visit | Attending: Cardiology | Admitting: Cardiology

## 2014-12-18 DIAGNOSIS — Z48812 Encounter for surgical aftercare following surgery on the circulatory system: Secondary | ICD-10-CM | POA: Diagnosis not present

## 2014-12-18 DIAGNOSIS — Z955 Presence of coronary angioplasty implant and graft: Secondary | ICD-10-CM | POA: Diagnosis not present

## 2014-12-20 ENCOUNTER — Encounter (HOSPITAL_COMMUNITY)
Admission: RE | Admit: 2014-12-20 | Discharge: 2014-12-20 | Disposition: A | Discharge status: Home / Self Care | Payer: Medicare Other | Source: Ambulatory Visit | Attending: Cardiology | Admitting: Cardiology

## 2014-12-20 DIAGNOSIS — Z48812 Encounter for surgical aftercare following surgery on the circulatory system: Secondary | ICD-10-CM | POA: Diagnosis not present

## 2014-12-20 DIAGNOSIS — Z955 Presence of coronary angioplasty implant and graft: Secondary | ICD-10-CM | POA: Diagnosis not present

## 2014-12-22 ENCOUNTER — Encounter (HOSPITAL_COMMUNITY)
Admission: RE | Admit: 2014-12-22 | Discharge: 2014-12-22 | Disposition: A | Discharge status: Home / Self Care | Payer: Medicare Other | Source: Ambulatory Visit | Attending: Cardiology | Admitting: Cardiology

## 2014-12-22 ENCOUNTER — Encounter: Payer: Self-pay | Admitting: Vascular Surgery

## 2014-12-22 DIAGNOSIS — Z48812 Encounter for surgical aftercare following surgery on the circulatory system: Secondary | ICD-10-CM | POA: Diagnosis not present

## 2014-12-22 DIAGNOSIS — Z955 Presence of coronary angioplasty implant and graft: Secondary | ICD-10-CM | POA: Diagnosis not present

## 2014-12-27 ENCOUNTER — Encounter (HOSPITAL_COMMUNITY)
Admission: RE | Admit: 2014-12-27 | Discharge: 2014-12-27 | Disposition: A | Discharge status: Home / Self Care | Payer: Medicare Other | Source: Ambulatory Visit | Attending: Cardiology | Admitting: Cardiology

## 2014-12-27 ENCOUNTER — Telehealth: Payer: Self-pay | Admitting: Cardiology

## 2014-12-27 DIAGNOSIS — Z48812 Encounter for surgical aftercare following surgery on the circulatory system: Secondary | ICD-10-CM | POA: Diagnosis not present

## 2014-12-27 DIAGNOSIS — Z955 Presence of coronary angioplasty implant and graft: Secondary | ICD-10-CM | POA: Diagnosis not present

## 2014-12-27 NOTE — Progress Notes (Signed)
Reviewed home exercise with pt today.  Pt plans to walk on treadmill at [facility] for exercise, 2-3 days in addition to CRPII.  Reviewed THR, pulse, RPE, sign and symptoms, NTG use, and when to call 911 or MD. Pt was educated on importance of NTG and was encouraged to keep it on him at all times. Pt voiced understanding.     Dorna Bloom, MS,ACSM RCEP

## 2014-12-28 ENCOUNTER — Other Ambulatory Visit: Payer: Self-pay | Admitting: Family

## 2014-12-28 ENCOUNTER — Ambulatory Visit (INDEPENDENT_AMBULATORY_CARE_PROVIDER_SITE_OTHER)
Admission: RE | Admit: 2014-12-28 | Discharge: 2014-12-28 | Disposition: A | Discharge status: Home / Self Care | Payer: Medicare Other | Source: Ambulatory Visit | Attending: Family | Admitting: Family

## 2014-12-28 ENCOUNTER — Ambulatory Visit (INDEPENDENT_AMBULATORY_CARE_PROVIDER_SITE_OTHER): Payer: Medicare Other | Admitting: Vascular Surgery

## 2014-12-28 ENCOUNTER — Ambulatory Visit (HOSPITAL_COMMUNITY)
Admission: RE | Admit: 2014-12-28 | Discharge: 2014-12-28 | Disposition: A | Discharge status: Home / Self Care | Payer: Medicare Other | Source: Ambulatory Visit | Attending: Vascular Surgery | Admitting: Vascular Surgery

## 2014-12-28 ENCOUNTER — Encounter: Payer: Self-pay | Admitting: Vascular Surgery

## 2014-12-28 VITALS — BP 150/58 | HR 53 | Ht 68.0 in | Wt 212.0 lb

## 2014-12-28 DIAGNOSIS — I70219 Atherosclerosis of native arteries of extremities with intermittent claudication, unspecified extremity: Secondary | ICD-10-CM | POA: Insufficient documentation

## 2014-12-28 DIAGNOSIS — I739 Peripheral vascular disease, unspecified: Secondary | ICD-10-CM

## 2014-12-28 DIAGNOSIS — I6523 Occlusion and stenosis of bilateral carotid arteries: Secondary | ICD-10-CM

## 2014-12-28 DIAGNOSIS — I10 Essential (primary) hypertension: Secondary | ICD-10-CM | POA: Diagnosis not present

## 2014-12-28 DIAGNOSIS — E785 Hyperlipidemia, unspecified: Secondary | ICD-10-CM | POA: Insufficient documentation

## 2014-12-28 DIAGNOSIS — E119 Type 2 diabetes mellitus without complications: Secondary | ICD-10-CM | POA: Insufficient documentation

## 2014-12-28 DIAGNOSIS — Z87891 Personal history of nicotine dependence: Secondary | ICD-10-CM | POA: Diagnosis not present

## 2014-12-28 DIAGNOSIS — I208 Other forms of angina pectoris: Secondary | ICD-10-CM

## 2014-12-28 NOTE — Progress Notes (Signed)
[facility] [place] HISTORY AND PHYSICAL    History of Present Illness:  Patient is a 69 y.o. year old male who presents for evaluation of asymptomatic carotid stenosis in bilateral lower extremity claudication.  His carotid stenosis was originally detected on workup for coronary bypass grafting. He denies any symptoms of TIA amaurosis or stroke. He also has bilateral lower extremity claudication. He states that he did get into cardiac rehabilitation in his walking distance had improved some. However, he recently required stenting of one of his coronary grafts and has lost some ground in the meanwhile. He is currently walking 30 minutes daily in the morning to try to improve his walking distance.  he experiences claudication at approximately one half block. 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 He has requested a handicapped sticker today. We've filled out paperwork for him regarding this. Other medical problems include hypertension, diabetes, coronary disease, elevated cholesterol, migraine. These are all currently stable. He also has an element of renal dysfunction and was seen by Dr. Moshe Cipro recently. He states his baseline creatinine is around 2. He also has some diabetic retinopathy. He is on Plavix and aspirin and statin. He also complains of intermittent swelling in both legs left greater than right this is somewhat improved by mild compression stockings.    Past Medical History  Diagnosis Date  . CAD S/P percutaneous coronary angioplasty 06/12/2011; 09/2104    a) 2012: Complex PCI mRCA ([facility]) --  2 overlapping Promus element DES stents.; 09/2014:  ost-prox SVG-rPDA - Xience DES 3.0 mm x 38 mm; (3.4  - 3.3 mm)  . 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  Likely thromboembolic event with PDA occlusion. 06/12/2011    Post Complex PCI  . S/P CABG x 3  07/01/2013    a. 07/01/2013 (Cath for Crescendo Unstable Angina) --> [name]: For LM disease; LIMA-LAD, SVG-RCA, SCG-Cx; b. 09/2014 Cath/Staged PCI: patent LIMA->LAD, VG->OM1/RI, VG->RCA 80% (3.0x38 Xience Alpine DES on 10/12/2014).  Marland Kitchen CAD (coronary artery disease), autologous vein bypass graft 09/2014    10/03/14: Cath for Class III-IV Angina -- 80% ostial-proximal SVG-RPDA (minimal flow down native PDA from native RCA with 50% proximal and distal, patent LIMA-LAD, SVG-OM with retrograde filling of OM 2. --> 10/12/14: Staged PCI of SVG-RPDA - Xience DES  3.0 mm x 38 mm; (3.4  - 3.3 mm)  . Aortic sclerosis  - without Stenosis  March 2014    Echo: Aortic sclerosis without stenosis. EF 55-60% with normal WM; mild concentric hypertrophy with normal relaxation.  Marland Kitchen PAD (peripheral artery disease)     with claudication  . High cholesterol   . Hypertension   . GERD (gastroesophageal reflux disease)   . Anxiety   . Complication of anesthesia     "I've had name recall recognition problems since my last anesthesia"  . Heart murmur   . Childhood asthma   . DM (diabetes mellitus) type II uncontrolled with eye manifestation 2013    Diabetic retinopathy with retinal hemorrhages since;, recurrent in August 2014 treated with Avastin shots  . Migraine ~ 2004    "once"  . CKD (chronic kidney disease), stage III     "mild; related to diabetes" (10/02/2014)  . Anemia     Past Surgical History  Procedure Laterality Date  . Tonsillectomy    . Nm myoview ltd  January '13  Diaphragmatic attenuation versus inferior infarct. No ischemia. Normal EF normal wall motion.  Marland Kitchen Nm myoview ltd  June 2014    no ischemia  . Coronary angioplasty with stent placement Right September 2012    2 overlapping Promus DES 2.7 mm at 12 mm x2; postdilated to 3 mm  . Cardiac catheterization  06/28/2013    ostial LM disese, RCA ~40-50%ISR  . Retinal laser procedure Bilateral     "more than once"   . Intraoperative transesophageal  echocardiogram N/A 07/01/2013    Procedure: INTRAOPERATIVE TRANSESOPHAGEAL ECHOCARDIOGRAM;  Surgeon: Grace Isaac, MD;  Location: [place];  Service: Open Heart Surgery;  Laterality: N/A;  . Transthoracic echocardiogram  06/29/2013    Focal basal hypertrophy with normal LV size. EF 60-65% no regional WMA. Mild LA dilation  . Left heart catheterization with coronary angiogram N/A 06/11/2011    Procedure: LEFT HEART CATHETERIZATION WITH CORONARY ANGIOGRAM;  Surgeon: Fulton Reek, MD;  Location: [facility] CATH LAB;  Service: Cardiovascular;  Laterality: N/A;  . Percutaneous coronary stent intervention (pci-s)  06/11/2011    Procedure: PERCUTANEOUS CORONARY STENT INTERVENTION (PCI-S);  Surgeon: Fulton Reek, MD;  Location: [facility] CATH LAB;  Service: Cardiovascular;;  . Left heart catheterization with coronary angiogram N/A 06/28/2013    Procedure: LEFT HEART CATHETERIZATION WITH CORONARY ANGIOGRAM;  Surgeon: Leonie Man, MD;  Location: [facility] CATH LAB;  Service: Cardiovascular;  Laterality: N/A;  . Coronary artery bypass graft N/A 07/01/2013    Procedure: CORONARY ARTERY BYPASS GRAFTING (CABG);  Surgeon: Grace Isaac, MD;  Location: [place];  Service: Open Heart Surgery;  Laterality: N/A;  Times 3 using left internal mammary artery and endoscopically harvested bilateral saphenous vein  . Left and right heart catheterization with coronary/graft angiogram N/A 10/03/2014    Procedure: LEFT AND RIGHT HEART CATHETERIZATION WITH Beatrix Fetters;  Surgeon: Leonie Man, MD;  Location: [facility] CATH LAB;  Service: Cardiovascular;  Laterality: N/A;  . Percutaneous coronary stent intervention (pci-s) N/A 10/12/2014    Procedure: PERCUTANEOUS CORONARY STENT INTERVENTION (PCI-S);  Surgeon: Leonie Man, MD;  Location: [facility] CATH LAB;  Ostial-Prox SVG-RCA Xience Alpine DES 3.0 mm x 38 mm; (3.4  - 3.3 mm)   History   Social History  . Marital Status: Married    Spouse Name: N/A  . Number of Children: N/A  . Years  of Education: N/A   Occupational History  . Not on file.   Social History Main Topics  . Smoking status: Former Smoker -- 2.00 packs/day for 27 years    Types: Cigarettes    Quit date: 11/23/1996  . Smokeless tobacco: Never Used  . Alcohol Use: 0.0 oz/week     Comment: 10/02/2014  "some weeks I might have 2-3 drinks then go for a month and not have any"  . Drug Use: No  . Sexual Activity: Not Currently   Other Topics Concern  . 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 He notably has not been exercising like he used to. He [place] He has social alcoholic beverages but is trying to cut that back and does not smoke.       He tells me he is a sucker for chips but does not eat sweets because of diabetes.    Family History Family History   Problem  Relation  Age  of Onset   .  Cancer  Sister         Lung cancer   .  Cancer  Mother     .  Cancer  Father     .  Hyperlipidemia  Father     .  Hypertension  Father     .  Cancer  Maternal Grandmother     .  Cancer  Paternal Grandfather       Allergies    Allergies   Allergen  Reactions   .  Atorvastatin  Other (See Comments)       Leg pain   .  Ibuprofen  Hives   .  Pravastatin     .  Simvastatin  Other (See Comments)       Leg pain    Current Outpatient Prescriptions on File Prior to Visit  Medication Sig Dispense Refill  . acetaminophen (TYLENOL) 325 MG tablet Take 650 mg by mouth every 4 (four) hours as needed for mild pain.     Marland Kitchen amLODipine (NORVASC) 10 MG tablet TAKE 1 TABLET ONCE DAILY. 30 tablet 9  . aspirin EC 81 MG tablet Take 1 tablet (81 mg total) by mouth every other day.    . B-D ULTRAFINE III SHORT PEN 31G X 8 MM MISC See admin instructions.  0  . carvedilol (COREG) 12.5 MG tablet Take 1 tablet (12.5 mg total) by mouth 2 (two) times daily. 60 tablet 11  . clopidogrel  (PLAVIX) 75 MG tablet Take 1 tablet (75 mg total) by mouth daily with breakfast. 30 tablet 11  . CRESTOR 10 MG tablet TAKE ONE TABLET EVERY OTHER DAY. 15 tablet 11  . fenofibrate 160 MG tablet Take 160 mg by mouth daily.    . furosemide (LASIX) 40 MG tablet Take 1 tablet (40 mg total) by mouth daily. (Patient taking differently: Take 40 mg by mouth 2 (two) times daily. ) 30 tablet 11  . HUMALOG KWIKPEN 100 UNIT/ML SOPN Inject 10-15 Units into the skin 3 (three) times daily. Sliding scale. 10 units with breakfast and 15 units with lunch and supper    . hydrALAZINE (APRESOLINE) 100 MG tablet Take 1 tablet (100 mg total) by mouth 2 (two) times daily. 60 tablet 11  . insulin glargine (LANTUS) 100 UNIT/ML injection Inject 40-70 Units into the skin 2 (two) times daily. 40 units in am, 70 units in pm    . isosorbide mononitrate (IMDUR) 60 MG 24 hr tablet Take 1 tablet (60 mg total) by mouth daily. 30 tablet 11  . NITROSTAT 0.4 MG SL tablet DISSOLVE 1 TABLET UNDER TONGUE AS NEEDED FOR CHEST PAIN,MAY REPEAT IN5 MINUTES FOR 2 DOSES. 25 tablet 1  . pantoprazole (PROTONIX) 40 MG tablet Take 40 mg by mouth daily.     Current Facility-Administered Medications on File Prior to Visit  Medication Dose Route Frequency Provider Last Rate Last Dose  . 0.9 %  sodium chloride infusion   Intravenous Continuous Leonie Man, MD        ROS:    General:  No weight loss, Fever, chills  HEENT: No recent headaches, no nasal bleeding, no visual changes, no sore throat  Neurologic: No dizziness, blackouts, seizures. No recent symptoms of stroke or mini- stroke. No recent episodes of slurred speech, or temporary blindness.  Cardiac: No recent episodes of chest pain/pressure, no shortness of breath at rest.  No shortness of breath with exertion.  Denies history of atrial fibrillation or irregular heartbeat  Vascular: No history of rest pain in feet.  No history of claudication.  No history of non-healing ulcer, No  history of DVT    Pulmonary: No home oxygen, no hemoptysis,  No asthma or wheezing  Musculoskeletal:  [ ]  Arthritis, [ ]  Low back pain,  [ ]  Joint pain  Hematologic:No history of hypercoagulable state.  No history of easy bleeding.  No history of anemia  Gastrointestinal: No hematochezia or melena,  No gastroesophageal reflux, no trouble swallowing  Urinary: [x ] chronic Kidney disease, [ ]  on HD - [ ]  MWF or [ ]  TTHS, [ ]  Burning with urination, [ ]  Frequent urination, [ ]  Difficulty urinating;    Skin: No rashes  Psychological: No history of anxiety,  No history of depression   Physical Examination    Filed Vitals:   12/28/14 1015 12/28/14 1016  BP: 149/58 150/58  Pulse: 53   Height: 5\' 8"  (1.727 m)   Weight: 212 lb (96.163 kg)   SpO2: 97%   .  General:  Alert and oriented, no acute distress HEENT: Normal Neck: Soft left carotid bruit no right bruit Pulmonary: Clear to auscultation bilaterally Cardiac: Regular Rate and Rhythm without murmur Abdomen: Soft, non-tender, non-distended, no mass Skin: No rash, no skin cracks or ulcers on the feet Extremity Pulses:  2+ radial, brachial, femoral pulses bilaterally absent popliteal and pedal pulses bilaterally Musculoskeletal: No deformity or edema     Neurologic: Upper and lower extremity motor 5/5 and symmetric  DATA:  Patient had bilateral ABIs performed today which were 0.72 on the right 0.64 on the left biphasic to monophasic waveforms. He also had a carotid duplex exam which showed a 60-80% left internal carotid artery stenosis a 40% right internal carotid artery stenosis.  I reviewed and interpreted this study.   ASSESSMENT:  #1 claudication not currently lifestyle limiting satisfied with his walking program rather than intervention at this point #2 moderate carotid stenosis asymptomatic on antiplatelet and statin therapy   PLAN:  Carotid duplex scan in 6 months. Repeat ABIs and arterial duplex in 6 months. Continue  walking program and risk factor modification.  Ruta Hinds, MD [facility] [place] Office: [phone] Pager: [phone]

## 2014-12-28 NOTE — Addendum Note (Signed)
Addended by: Dorthula Rue L on: 12/28/2014 02:37 PM   Modules accepted: Orders

## 2014-12-28 NOTE — Patient Instructions (Signed)
Peripheral Vascular Disease Peripheral Vascular Disease (PVD), also called Peripheral Arterial Disease (PAD), is a circulation problem caused by cholesterol (atherosclerotic plaque) deposits in the arteries. PVD commonly occurs in the lower extremities (legs) but it can occur in other areas of the body, such as your arms. The cholesterol buildup in the arteries reduces blood flow which can cause pain and other serious problems. The presence of PVD can place a person at risk for Coronary Artery Disease (CAD).  CAUSES  Causes of PVD can be many. It is usually associated with more than one risk factor such as:   High Cholesterol.  Smoking.  Diabetes.  Lack of exercise or inactivity.  High blood pressure (hypertension).  Obesity.  Family history. SYMPTOMS   When the lower extremities are affected, patients with PVD may experience:  Leg pain with exertion or physical activity. This is called INTERMITTENT CLAUDICATION. This may present as cramping or numbness with physical activity. The location of the pain is associated with the level of blockage. For example, blockage at the abdominal level (distal abdominal aorta) may result in buttock or hip pain. Lower leg arterial blockage may result in calf pain.  As PVD becomes more severe, pain can develop with less physical activity.  In people with severe PVD, leg pain may occur at rest.  Other PVD signs and symptoms:  Leg numbness or weakness.  Coldness in the affected leg or foot, especially when compared to the other leg.  A change in leg color.  Patients with significant PVD are more prone to ulcers or sores on toes, feet or legs. These may take longer to heal or may reoccur. The ulcers or sores can become infected.  If signs and symptoms of PVD are ignored, gangrene may occur. This can result in the loss of toes or loss of an entire limb.  Not all leg pain is related to PVD. Other medical conditions can cause leg pain such  as:  Blood clots (embolism) or Deep Vein Thrombosis.  Inflammation of the blood vessels (vasculitis).  Spinal stenosis. DIAGNOSIS  Diagnosis of PVD can involve several different types of tests. These can include:  Pulse Volume Recording Method (PVR). This test is simple, painless and does not involve the use of X-rays. PVR involves measuring and comparing the blood pressure in the arms and legs. An ABI (Ankle-Brachial Index) is calculated. The normal ratio of blood pressures is 1. As this number becomes smaller, it indicates more severe disease.  < 0.95 - indicates significant narrowing in one or more leg vessels.  <0.8 - there will usually be pain in the foot, leg or buttock with exercise.  <0.4 - will usually have pain in the legs at rest.  <0.25 - usually indicates limb threatening PVD.  Doppler detection of pulses in the legs. This test is painless and checks to see if you have a pulses in your legs/feet.  A dye or contrast material (a substance that highlights the blood vessels so they show up on x-ray) may be given to help your caregiver better see the arteries for the following tests. The dye is eliminated from your body by the kidney's. Your caregiver may order blood work to check your kidney function and other laboratory values before the following tests are performed:  Magnetic Resonance Angiography (MRA). An MRA is a picture study of the blood vessels and arteries. The MRA machine uses a large magnet to produce images of the blood vessels.  Computed Tomography Angiography (CTA). A CTA   is a specialized x-ray that looks at how the blood flows in your blood vessels. An IV may be inserted into your arm so contrast dye can be injected.  Angiogram. Is a procedure that uses x-rays to look at your blood vessels. This procedure is minimally invasive, meaning a small incision (cut) is made in your groin. A small tube (catheter) is then inserted into the artery of your groin. The catheter  is guided to the blood vessel or artery your caregiver wants to examine. Contrast dye is injected into the catheter. X-rays are then taken of the blood vessel or artery. After the images are obtained, the catheter is taken out. TREATMENT  Treatment of PVD involves many interventions which may include:  Lifestyle changes:  Quitting smoking.  Exercise.  Following a low fat, low cholesterol diet.  Control of diabetes.  Foot care is very important to the PVD patient. Good foot care can help prevent infection.  Medication:  Cholesterol-lowering medicine.  Blood pressure medicine.  Anti-platelet drugs.  Certain medicines may reduce symptoms of Intermittent Claudication.  Interventional/Surgical options:  Angioplasty. An Angioplasty is a procedure that inflates a balloon in the blocked artery. This opens the blocked artery to improve blood flow.  Stent Implant. A wire mesh tube (stent) is placed in the artery. The stent expands and stays in place, allowing the artery to remain open.  Peripheral Bypass Surgery. This is a surgical procedure that reroutes the blood around a blocked artery to help improve blood flow. This type of procedure may be performed if Angioplasty or stent implants are not an option. SEEK IMMEDIATE MEDICAL CARE IF:   You develop pain or numbness in your arms or legs.  Your arm or leg turns cold, becomes blue in color.  You develop redness, warmth, swelling and pain in your arms or legs. MAKE SURE YOU:   Understand these instructions.  Will watch your condition.  Will get help right away if you are not doing well or get worse.    Carotid Artery Disease The carotid arteries are the two main arteries on either side of the neck that supply blood to the brain. Carotid artery disease, also called carotid artery stenosis, is the narrowing or blockage of one or both carotid arteries. Carotid artery disease increases your risk for a stroke or a transient  ischemic attack (TIA). A TIA is an episode in which a waxy, fatty substance that accumulates within the artery (plaque) blocks blood flow to the brain. A TIA is considered a "warning stroke."  CAUSES   Buildup of plaque inside the carotid arteries (atherosclerosis) (common).  A weakened outpouching in an artery (aneurysm).  Inflammation of the carotid artery (arteritis).  A fibrous growth within the carotid artery (fibromuscular dysplasia).  Tissue death within the carotid artery due to radiation treatment (post-radiation necrosis).  Decreased blood flow due to spasms of the carotid artery (vasospasm).  Separation of the walls of the carotid artery (carotid dissection). RISK FACTORS  High cholesterol (dyslipidemia).   High blood pressure (hypertension).   Smoking.   Obesity.   Diabetes.   Family history of cardiovascular disease.   Inactivity or lack of regular exercise.   Being male. Men have an increased risk of developing atherosclerosis earlier in life than women.  SYMPTOMS  Carotid artery disease does not cause symptoms. DIAGNOSIS Diagnosis of carotid artery disease may include:   A physical exam. Your health care provider may hear an abnormal sound (bruit) when listening to the carotid arteries.  Specific tests that look at the blood flow in the carotid arteries. These tests include:   Carotid artery ultrasonography.   Carotid or cerebral angiography.   Computerized tomographic angiography (CTA).   Magnetic resonance angiography (MRA).  TREATMENT  Treatment of carotid artery disease can include a combination of treatments. Treatment options include:  Surgery. You may have:   A carotid endarterectomy. This is a surgery to remove the blockages in the carotid arteries.   A carotid angioplasty with stenting. This is a nonsurgical interventional procedure. A wire mesh (stent) is used to widen the blocked carotid arteries.   Medicines to  control blood pressure, cholesterol, and reduce blood clotting (antiplatelet therapy).   Adjusting your diet.   Lifestyle changes such as:   Quitting smoking.   Exercising as tolerated or as directed by your health care provider.   Controlling and maintaining a good blood pressure.   Keeping cholesterol levels under control.  HOME CARE INSTRUCTIONS   Take medicines only as directed by your health care provider. Make sure you understand all your medicine instructions. Do not stop your medicines without talking to your health care provider.   Follow your health care provider's diet instructions. It is important to eat a healthy diet that is low in saturated fats and includes plenty of fresh fruits, vegetables, and lean meats. High-fat, high-sodium foods as well as foods that are fried, overly processed, or have poor nutritional value should be avoided.  Maintain a healthy weight.   Stay physically active. It is recommended that you get at least 30 minutes of activity every day.   Do not use any tobacco products including cigarettes, chewing tobacco, or electronic cigarettes. If you need help quitting, ask your health care provider.  Limit alcohol use to:   No more than 2 drinks per day for men.   No more than 1 drink per day for nonpregnant women.   Do not use illegal drugs.   Keep all follow-up visits as directed by your health care provider.  SEEK IMMEDIATE MEDICAL CARE IF:  You develop TIA or stroke symptoms. These include:   Sudden weakness or numbness on one side of the body, such as in the face, arm, or leg.   Sudden confusion.   Trouble speaking (aphasia) or understanding.   Sudden trouble seeing out of one or both eyes.   Sudden trouble walking.   Dizziness or feeling like you might faint.   Loss of balance or coordination.   Sudden severe headache with no known cause.   Sudden trouble swallowing (dysphagia).  If you have any of  these symptoms, call your local emergency services (911 in [place] Do not drive yourself to the clinic or hospital. This is a medical emergency.  Document Released: 09/01/2011 Document Revised: 10/24/2013 Document Reviewed: 12/08/2012 [facility] Patient Information 2015 [place], [place]. This information is not intended to replace advice given to you by your health care provider. Make sure you discuss any questions you have with your health care provider.

## 2014-12-29 ENCOUNTER — Encounter (HOSPITAL_COMMUNITY)
Admission: RE | Admit: 2014-12-29 | Discharge: 2014-12-29 | Disposition: A | Discharge status: Home / Self Care | Payer: Medicare Other | Source: Ambulatory Visit | Attending: Cardiology | Admitting: Cardiology

## 2014-12-29 DIAGNOSIS — Z48812 Encounter for surgical aftercare following surgery on the circulatory system: Secondary | ICD-10-CM | POA: Diagnosis not present

## 2014-12-29 DIAGNOSIS — Z955 Presence of coronary angioplasty implant and graft: Secondary | ICD-10-CM | POA: Diagnosis not present

## 2014-12-29 NOTE — Telephone Encounter (Signed)
Close encounter 

## 2014-12-29 NOTE — Progress Notes (Signed)
QUALITY OF LIFE SCORE REVIEW Patient score low in areas of overall 15.13, health and function 11.67, socioeconomic 20.29, Psychological and Spiritual 15.93 and Family 17.20  .  Scores less than 21 are considered low.  Patient quality of life slightly altered by physical constraints which limits ability to perform as prior to recent cardiac illness.  Pt is a repeat participant in cardiac rehab.  Pt with continued issues with circulation which effects his quality of life.  Pt feels he is unable to do things that require physical activity particularly with walking. Although he has noted some improvement with his walking since he started cardiac rehab.  Pt plans to increase his time on the treadmill at the gym on his off days from rehab.  Pt feels supportive by his wife and able to financial take care of his family.  He would like to retire at some point but financially is unable to do so right now.  Offered emotional support and reassurance.  Will continue to monitor and intervene as necessary. Cherre Huger, BSN

## 2015-01-01 ENCOUNTER — Encounter (HOSPITAL_COMMUNITY)
Admission: RE | Admit: 2015-01-01 | Discharge: 2015-01-01 | Disposition: A | Discharge status: Home / Self Care | Payer: Medicare Other | Source: Ambulatory Visit | Attending: Cardiology | Admitting: Cardiology

## 2015-01-01 DIAGNOSIS — Z955 Presence of coronary angioplasty implant and graft: Secondary | ICD-10-CM | POA: Diagnosis not present

## 2015-01-01 DIAGNOSIS — Z48812 Encounter for surgical aftercare following surgery on the circulatory system: Secondary | ICD-10-CM | POA: Diagnosis not present

## 2015-01-03 ENCOUNTER — Encounter (HOSPITAL_COMMUNITY)
Admission: RE | Admit: 2015-01-03 | Discharge: 2015-01-03 | Disposition: A | Discharge status: Home / Self Care | Payer: Medicare Other | Source: Ambulatory Visit | Attending: Cardiology | Admitting: Cardiology

## 2015-01-03 DIAGNOSIS — Z48812 Encounter for surgical aftercare following surgery on the circulatory system: Secondary | ICD-10-CM | POA: Diagnosis not present

## 2015-01-03 DIAGNOSIS — Z955 Presence of coronary angioplasty implant and graft: Secondary | ICD-10-CM | POA: Diagnosis not present

## 2015-01-05 ENCOUNTER — Encounter (HOSPITAL_COMMUNITY): Payer: Medicare Other

## 2015-01-08 ENCOUNTER — Encounter (HOSPITAL_COMMUNITY)
Admission: RE | Admit: 2015-01-08 | Discharge: 2015-01-08 | Disposition: A | Discharge status: Home / Self Care | Payer: Medicare Other | Source: Ambulatory Visit | Attending: Cardiology | Admitting: Cardiology

## 2015-01-08 DIAGNOSIS — Z955 Presence of coronary angioplasty implant and graft: Secondary | ICD-10-CM | POA: Diagnosis not present

## 2015-01-08 DIAGNOSIS — Z48812 Encounter for surgical aftercare following surgery on the circulatory system: Secondary | ICD-10-CM | POA: Diagnosis not present

## 2015-01-08 LAB — GLUCOSE, CAPILLARY: Glucose-Capillary: 150 mg/dL — ABNORMAL HIGH (ref 65–99)

## 2015-01-08 NOTE — Progress Notes (Signed)
Lance Collier 69 y.o. male Nutrition Note Spoke with pt. Nutrition Plan and Nutrition Survey goals reviewed with pt. Pt is following Step 2 of the Therapeutic Lifestyle Changes diet. Pt is diabetic. Last A1c noted indicates blood glucose poorly controlled. Per discussion with pt, "I've had several A1c's since then and they've been 7.0-7.2." Pt feels his A1c has improved "because I got serious about my diet." This writer went over Diabetes Education test results. Pt checks CBG's 3-4 times a day. Fasting CBG's reportedly 70-140 mg/dL. Pt reports treating hypoglycemic episodes with "Nekot crackers and over doing it." Proper treatment of hypoglycemia discussed. Pt states he does count carbs and uses SSI but "I don't have a set number of carbs I eat at each meal." Pt with dx of CHF. Pt eats out frequently, which limits pt ability to control sodium intake. Pt expressed understanding of the information reviewed. Pt aware of nutrition education classes offered and is unable to attend nutrition classes. Lab Results  Component Value Date   HGBA1C 10.2* 06/29/2013  Nutrition Diagnosis ? Food-and nutrition-related knowledge deficit related to lack of exposure to information as related to diagnosis of: ? CVD ? DM ? Obesity related to excessive energy intake as evidenced by a BMI of 32.7  Nutrition RX/ Estimated Daily Nutrition Needs for: wt loss 1600-2100 Kcal, 40-55 gm fat, 10-14 gm sat fat, 1.5-2.1 gm trans-fat, <1500 mg sodium, 175-250 gm CHO   Nutrition Intervention ? Pt's individual nutrition plan reviewed with pt. ? Benefits of adopting Therapeutic Lifestyle Changes discussed when Medficts reviewed. ? Pt to attend the Portion Distortion class - met; 12/15/14 ? Pt to attend the Diabetes Q & A class - met; 12/29/14   ? Pt given handouts for: ? Nutrition I class ? Nutrition II class ? Diabetes Blitz Class ? Continue client-centered nutrition education by RD, as part of interdisciplinary  care. Goal(s) ? Pt to identify food quantities necessary to achieve: ? wt loss to a goal wt of 190-208 lb (86.7-94.9 kg) at graduation from cardiac rehab.  ? CBG concentrations in the normal range or as close to normal as is safely possible. Monitor and Evaluate progress toward nutrition goal with team. Nutrition Risk: Change to Moderate [name], M.Ed, RD, LDN, CDE 01/08/2015 8:11 AM

## 2015-01-10 ENCOUNTER — Encounter (HOSPITAL_COMMUNITY)
Admission: RE | Admit: 2015-01-10 | Discharge: 2015-01-10 | Disposition: A | Discharge status: Home / Self Care | Payer: Medicare Other | Source: Ambulatory Visit | Attending: Cardiology | Admitting: Cardiology

## 2015-01-10 DIAGNOSIS — Z48812 Encounter for surgical aftercare following surgery on the circulatory system: Secondary | ICD-10-CM | POA: Diagnosis not present

## 2015-01-10 DIAGNOSIS — Z955 Presence of coronary angioplasty implant and graft: Secondary | ICD-10-CM | POA: Diagnosis not present

## 2015-01-10 NOTE — Progress Notes (Signed)
  30 day Psychosocial followup assessment  Patient psychosocial assessment reveals no barriers to cardiac rehab participation.  Psychosocial areas that are affecting patient's rehab experience include concerns about ambulation.  Pt presently is walking on the treadmill with incline.  Pt is able to ambulate about 5 minutes before he has to stop and rest due to leg discomfort.  Pt is being seen by vascular surgeon and on recent office visit.  Treatments were outline.  Pt is reluctant to have stent placed due to the possibility of kidney damage.  Pt limits his ambulation and fortunately his work does not require a lot of activity.  Pt feels supported by his wife. Pt walks on treadmill at home on his off days from rehab.  Pt continues to try and push past the 5 minutes of walking before he has to take a break.  Pt fully understands the benefit of collateral circulation. Patient reports feeling positive about current and projected progress toward cardiac rehab goals.  Patient's rate of progress towards goals is fair  Pt would like to lose weight.  Pt making some progress toward this goal.  Plan of action to help patient continue to work towards rehab goals include consistency to home exercise.  Will continue to monitor and evaluate progress toward psychosocial goals.  Goals in progress: improved management of pain in the legs and sob. Help patient work toward returning to meaningful activities that improve patient's quality of life and are attainable. Cherre Huger, BSN

## 2015-01-12 ENCOUNTER — Encounter (HOSPITAL_COMMUNITY)
Admission: RE | Admit: 2015-01-12 | Discharge: 2015-01-12 | Disposition: A | Discharge status: Home / Self Care | Payer: Medicare Other | Source: Ambulatory Visit | Attending: Cardiology | Admitting: Cardiology

## 2015-01-12 DIAGNOSIS — Z48812 Encounter for surgical aftercare following surgery on the circulatory system: Secondary | ICD-10-CM | POA: Diagnosis not present

## 2015-01-12 DIAGNOSIS — Z955 Presence of coronary angioplasty implant and graft: Secondary | ICD-10-CM | POA: Diagnosis not present

## 2015-01-15 ENCOUNTER — Encounter (HOSPITAL_COMMUNITY)
Admission: RE | Admit: 2015-01-15 | Discharge: 2015-01-15 | Disposition: A | Discharge status: Home / Self Care | Payer: Medicare Other | Source: Ambulatory Visit | Attending: Cardiology | Admitting: Cardiology

## 2015-01-15 DIAGNOSIS — Z48812 Encounter for surgical aftercare following surgery on the circulatory system: Secondary | ICD-10-CM | POA: Diagnosis not present

## 2015-01-15 DIAGNOSIS — Z955 Presence of coronary angioplasty implant and graft: Secondary | ICD-10-CM | POA: Diagnosis not present

## 2015-01-17 ENCOUNTER — Encounter (HOSPITAL_COMMUNITY)
Admission: RE | Admit: 2015-01-17 | Discharge: 2015-01-17 | Disposition: A | Discharge status: Home / Self Care | Payer: Medicare Other | Source: Ambulatory Visit | Attending: Cardiology | Admitting: Cardiology

## 2015-01-17 DIAGNOSIS — Z955 Presence of coronary angioplasty implant and graft: Secondary | ICD-10-CM | POA: Diagnosis not present

## 2015-01-17 DIAGNOSIS — Z48812 Encounter for surgical aftercare following surgery on the circulatory system: Secondary | ICD-10-CM | POA: Diagnosis not present

## 2015-01-19 ENCOUNTER — Encounter (HOSPITAL_COMMUNITY)
Admission: RE | Admit: 2015-01-19 | Discharge: 2015-01-19 | Disposition: A | Discharge status: Home / Self Care | Payer: Medicare Other | Source: Ambulatory Visit | Attending: Cardiology | Admitting: Cardiology

## 2015-01-19 DIAGNOSIS — Z955 Presence of coronary angioplasty implant and graft: Secondary | ICD-10-CM | POA: Diagnosis not present

## 2015-01-19 DIAGNOSIS — Z48812 Encounter for surgical aftercare following surgery on the circulatory system: Secondary | ICD-10-CM | POA: Diagnosis not present

## 2015-01-22 ENCOUNTER — Encounter (HOSPITAL_COMMUNITY)
Admission: RE | Admit: 2015-01-22 | Discharge: 2015-01-22 | Disposition: A | Discharge status: Home / Self Care | Payer: Medicare Other | Source: Ambulatory Visit | Attending: Cardiology | Admitting: Cardiology

## 2015-01-22 DIAGNOSIS — Z48812 Encounter for surgical aftercare following surgery on the circulatory system: Secondary | ICD-10-CM | POA: Diagnosis not present

## 2015-01-22 DIAGNOSIS — Z955 Presence of coronary angioplasty implant and graft: Secondary | ICD-10-CM | POA: Insufficient documentation

## 2015-01-22 LAB — GLUCOSE, CAPILLARY: GLUCOSE-CAPILLARY: 176 mg/dL — AB (ref 65–99)

## 2015-01-24 ENCOUNTER — Encounter (HOSPITAL_COMMUNITY)
Admission: RE | Admit: 2015-01-24 | Discharge: 2015-01-24 | Disposition: A | Discharge status: Home / Self Care | Payer: Medicare Other | Source: Ambulatory Visit | Attending: Cardiology | Admitting: Cardiology

## 2015-01-24 DIAGNOSIS — Z955 Presence of coronary angioplasty implant and graft: Secondary | ICD-10-CM | POA: Diagnosis not present

## 2015-01-24 DIAGNOSIS — Z48812 Encounter for surgical aftercare following surgery on the circulatory system: Secondary | ICD-10-CM | POA: Diagnosis not present

## 2015-01-26 ENCOUNTER — Encounter (HOSPITAL_COMMUNITY): Payer: Medicare Other

## 2015-01-29 ENCOUNTER — Encounter (HOSPITAL_COMMUNITY)
Admission: RE | Admit: 2015-01-29 | Discharge: 2015-01-29 | Disposition: A | Discharge status: Home / Self Care | Payer: Medicare Other | Source: Ambulatory Visit | Attending: Cardiology | Admitting: Cardiology

## 2015-01-29 DIAGNOSIS — N08 Glomerular disorders in diseases classified elsewhere: Secondary | ICD-10-CM | POA: Diagnosis not present

## 2015-01-29 DIAGNOSIS — E1142 Type 2 diabetes mellitus with diabetic polyneuropathy: Secondary | ICD-10-CM | POA: Diagnosis not present

## 2015-01-29 DIAGNOSIS — I6529 Occlusion and stenosis of unspecified carotid artery: Secondary | ICD-10-CM | POA: Diagnosis not present

## 2015-01-29 DIAGNOSIS — I739 Peripheral vascular disease, unspecified: Secondary | ICD-10-CM | POA: Diagnosis not present

## 2015-01-29 DIAGNOSIS — D509 Iron deficiency anemia, unspecified: Secondary | ICD-10-CM | POA: Diagnosis not present

## 2015-01-29 DIAGNOSIS — E11359 Type 2 diabetes mellitus with proliferative diabetic retinopathy without macular edema: Secondary | ICD-10-CM | POA: Diagnosis not present

## 2015-01-29 DIAGNOSIS — E1139 Type 2 diabetes mellitus with other diabetic ophthalmic complication: Secondary | ICD-10-CM | POA: Diagnosis not present

## 2015-01-29 DIAGNOSIS — D559 Anemia due to enzyme disorder, unspecified: Secondary | ICD-10-CM | POA: Diagnosis not present

## 2015-01-29 DIAGNOSIS — Z48812 Encounter for surgical aftercare following surgery on the circulatory system: Secondary | ICD-10-CM | POA: Diagnosis not present

## 2015-01-29 DIAGNOSIS — E785 Hyperlipidemia, unspecified: Secondary | ICD-10-CM | POA: Diagnosis not present

## 2015-01-29 DIAGNOSIS — N183 Chronic kidney disease, stage 3 (moderate): Secondary | ICD-10-CM | POA: Diagnosis not present

## 2015-01-29 DIAGNOSIS — Z6831 Body mass index (BMI) 31.0-31.9, adult: Secondary | ICD-10-CM | POA: Diagnosis not present

## 2015-01-29 DIAGNOSIS — I10 Essential (primary) hypertension: Secondary | ICD-10-CM | POA: Diagnosis not present

## 2015-01-29 DIAGNOSIS — I25810 Atherosclerosis of coronary artery bypass graft(s) without angina pectoris: Secondary | ICD-10-CM | POA: Diagnosis not present

## 2015-01-29 DIAGNOSIS — Z955 Presence of coronary angioplasty implant and graft: Secondary | ICD-10-CM | POA: Diagnosis not present

## 2015-01-31 ENCOUNTER — Encounter (HOSPITAL_COMMUNITY)
Admission: RE | Admit: 2015-01-31 | Discharge: 2015-01-31 | Disposition: A | Discharge status: Home / Self Care | Payer: Medicare Other | Source: Ambulatory Visit | Attending: Cardiology | Admitting: Cardiology

## 2015-01-31 DIAGNOSIS — Z955 Presence of coronary angioplasty implant and graft: Secondary | ICD-10-CM | POA: Diagnosis not present

## 2015-01-31 DIAGNOSIS — Z48812 Encounter for surgical aftercare following surgery on the circulatory system: Secondary | ICD-10-CM | POA: Diagnosis not present

## 2015-02-02 ENCOUNTER — Encounter (HOSPITAL_COMMUNITY)
Admission: RE | Admit: 2015-02-02 | Discharge: 2015-02-02 | Disposition: A | Discharge status: Home / Self Care | Payer: Medicare Other | Source: Ambulatory Visit | Attending: Cardiology | Admitting: Cardiology

## 2015-02-02 DIAGNOSIS — Z48812 Encounter for surgical aftercare following surgery on the circulatory system: Secondary | ICD-10-CM | POA: Diagnosis not present

## 2015-02-02 DIAGNOSIS — Z955 Presence of coronary angioplasty implant and graft: Secondary | ICD-10-CM | POA: Diagnosis not present

## 2015-02-02 LAB — GLUCOSE, CAPILLARY: GLUCOSE-CAPILLARY: 93 mg/dL (ref 65–99)

## 2015-02-05 ENCOUNTER — Encounter (HOSPITAL_COMMUNITY): Payer: Medicare Other

## 2015-02-06 ENCOUNTER — Encounter: Payer: Self-pay | Admitting: Cardiology

## 2015-02-07 ENCOUNTER — Encounter (HOSPITAL_COMMUNITY): Payer: Medicare Other

## 2015-02-09 ENCOUNTER — Encounter (HOSPITAL_COMMUNITY)
Admission: RE | Admit: 2015-02-09 | Discharge: 2015-02-09 | Disposition: A | Discharge status: Home / Self Care | Payer: Medicare Other | Source: Ambulatory Visit | Attending: Cardiology | Admitting: Cardiology

## 2015-02-09 DIAGNOSIS — Z955 Presence of coronary angioplasty implant and graft: Secondary | ICD-10-CM | POA: Diagnosis not present

## 2015-02-09 DIAGNOSIS — Z48812 Encounter for surgical aftercare following surgery on the circulatory system: Secondary | ICD-10-CM | POA: Diagnosis not present

## 2015-02-09 LAB — GLUCOSE, CAPILLARY: Glucose-Capillary: 166 mg/dL — ABNORMAL HIGH (ref 65–99)

## 2015-02-12 ENCOUNTER — Encounter (HOSPITAL_COMMUNITY)
Admission: RE | Admit: 2015-02-12 | Discharge: 2015-02-12 | Disposition: A | Discharge status: Home / Self Care | Payer: Medicare Other | Source: Ambulatory Visit | Attending: Cardiology | Admitting: Cardiology

## 2015-02-12 DIAGNOSIS — Z48812 Encounter for surgical aftercare following surgery on the circulatory system: Secondary | ICD-10-CM | POA: Diagnosis not present

## 2015-02-12 DIAGNOSIS — Z955 Presence of coronary angioplasty implant and graft: Secondary | ICD-10-CM | POA: Diagnosis not present

## 2015-02-14 ENCOUNTER — Encounter (HOSPITAL_COMMUNITY)
Admission: RE | Admit: 2015-02-14 | Discharge: 2015-02-14 | Disposition: A | Discharge status: Home / Self Care | Payer: Medicare Other | Source: Ambulatory Visit | Attending: Cardiology | Admitting: Cardiology

## 2015-02-14 DIAGNOSIS — Z48812 Encounter for surgical aftercare following surgery on the circulatory system: Secondary | ICD-10-CM | POA: Diagnosis not present

## 2015-02-14 DIAGNOSIS — Z955 Presence of coronary angioplasty implant and graft: Secondary | ICD-10-CM | POA: Diagnosis not present

## 2015-02-16 ENCOUNTER — Ambulatory Visit (INDEPENDENT_AMBULATORY_CARE_PROVIDER_SITE_OTHER): Payer: Medicare Other | Admitting: Cardiology

## 2015-02-16 ENCOUNTER — Encounter (HOSPITAL_COMMUNITY)
Admission: RE | Admit: 2015-02-16 | Discharge: 2015-02-16 | Disposition: A | Discharge status: Home / Self Care | Payer: Medicare Other | Source: Ambulatory Visit | Attending: Cardiology | Admitting: Cardiology

## 2015-02-16 ENCOUNTER — Encounter: Payer: Self-pay | Admitting: Cardiology

## 2015-02-16 VITALS — BP 138/60 | HR 54 | Ht 68.0 in | Wt 213.0 lb

## 2015-02-16 DIAGNOSIS — I10 Essential (primary) hypertension: Secondary | ICD-10-CM | POA: Diagnosis not present

## 2015-02-16 DIAGNOSIS — R0600 Dyspnea, unspecified: Secondary | ICD-10-CM

## 2015-02-16 DIAGNOSIS — R600 Localized edema: Secondary | ICD-10-CM

## 2015-02-16 DIAGNOSIS — I739 Peripheral vascular disease, unspecified: Secondary | ICD-10-CM

## 2015-02-16 DIAGNOSIS — I5032 Chronic diastolic (congestive) heart failure: Secondary | ICD-10-CM | POA: Diagnosis not present

## 2015-02-16 DIAGNOSIS — I2510 Atherosclerotic heart disease of native coronary artery without angina pectoris: Secondary | ICD-10-CM

## 2015-02-16 DIAGNOSIS — Z48812 Encounter for surgical aftercare following surgery on the circulatory system: Secondary | ICD-10-CM | POA: Diagnosis not present

## 2015-02-16 DIAGNOSIS — Z9861 Coronary angioplasty status: Secondary | ICD-10-CM | POA: Diagnosis not present

## 2015-02-16 DIAGNOSIS — R0609 Other forms of dyspnea: Secondary | ICD-10-CM

## 2015-02-16 DIAGNOSIS — E669 Obesity, unspecified: Secondary | ICD-10-CM

## 2015-02-16 DIAGNOSIS — I208 Other forms of angina pectoris: Secondary | ICD-10-CM | POA: Diagnosis not present

## 2015-02-16 DIAGNOSIS — Z955 Presence of coronary angioplasty implant and graft: Secondary | ICD-10-CM | POA: Diagnosis not present

## 2015-02-16 MED ORDER — CARVEDILOL 12.5 MG PO TABS: ORAL_TABLET | ORAL | Status: DC

## 2015-02-16 NOTE — Progress Notes (Signed)
PCP: Lance Stack, MD  Clinic Note: Chief Complaint  Patient presents with  . 3 month follow up    Patient has had SOB and swelling in his ankles, legs, and feet.    HPI: Lance Collier is a 69 y.o. male with a PMH below who presents today for initial followup after stage PCI of the SVG-RCA back on April 21. He initially had diagnostic catheterization on April 12, and was brought back for precatheterization with staged PCI of the ostial/proximal SVG-RCA.   I last saw him on May 9th for post-cath follow-up.  He still noted exertional dyspnea with lack of notable Sx improvement post PCI.  Was evaluated with a Myoview ST.  Studies Reviewed:  LEXISCAN MYOVIEW 11/19/2013: LOW RISK test with mild diaphragmatic attenuation artifact, but otherwise normal perfusion. Normal LV funcion - EF 61%  Echo 09/25/2014:  Mod focal basal & mild overall LVH.  EF 55-60%.  Mild Ao Sclerosis, Mild MR with MAC.  Only minimally elevated RVP.  His nephrologist increased his Lasix dose to 80 mg daily to 4 times per week and 40 mg a day the rest of the days. His dyspnea and edema has improved notably since that.  Past Medical History  Diagnosis Date  . CAD S/P percutaneous coronary angioplasty 06/12/2011; 09/2104    a) 2012: Complex PCI mRCA ([place]) --  2 overlapping Promus element DES stents.; 09/2014:  ost-prox SVG-rPDA - Xience DES 3.0 mm x 38 mm; (3.4  - 3.3 mm)  . 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  Likely thromboembolic event with PDA occlusion. 06/12/2011    Post Complex PCI  . 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 07/01/2013 (Cath for Crescendo Unstable Angina) --> Lance Collier: For LM disease; LIMA-LAD, SVG-RCA, SCG-Cx; b. 09/2014 Cath/Staged PCI: patent LIMA->LAD, VG->OM1/RI, VG->RCA 80% (3.0x38 Xience Alpine DES on 10/12/2014).  Marland Kitchen CAD (coronary artery disease), autologous vein bypass graft 09/2014    10/03/14: Cath for Class III-IV Angina -- 80%  ostial-proximal SVG-RPDA (minimal flow down native PDA from native RCA with 50% proximal and distal, patent LIMA-LAD, SVG-OM with retrograde filling of OM 2. --> 10/12/14: Staged PCI of SVG-RPDA - Xience DES  3.0 mm x 38 mm; (3.4  - 3.3 mm)  . Aortic sclerosis  - without Stenosis  March 2014    Echo: Aortic sclerosis without stenosis. EF 55-60% with normal WM; mild concentric hypertrophy with normal relaxation.  Marland Kitchen PAD (peripheral artery disease)     with claudication  . High cholesterol   . Hypertension   . GERD (gastroesophageal reflux disease)   . Anxiety   . Complication of anesthesia     "I've had name recall recognition problems since my last anesthesia"  . Heart murmur   . Childhood asthma   . DM (diabetes mellitus) type II uncontrolled with eye manifestation 2013    Diabetic retinopathy with retinal hemorrhages since;, recurrent in August 2014 treated with Avastin shots  . Migraine ~ 2004    "once"  . CKD (chronic kidney disease), stage III     "mild; related to diabetes" (10/02/2014)  . Anemia     Prior Cardiac Evaluation and Past Surgical History: Past Surgical History  Procedure Laterality Date  . Tonsillectomy    . Nm myoview ltd  January '13    Diaphragmatic attenuation versus inferior infarct. No ischemia. Normal EF normal wall motion.  Marland Kitchen Nm myoview ltd  June 2014  no ischemia  . Coronary angioplasty with stent placement Right September 2012    2 overlapping Promus DES 2.7 mm at 12 mm x2; postdilated to 3 mm  . Cardiac catheterization  06/28/2013    ostial LM disese, RCA ~40-50%ISR  . Retinal laser procedure Bilateral     "more than once"   . Intraoperative transesophageal echocardiogram N/A 07/01/2013    Procedure: INTRAOPERATIVE TRANSESOPHAGEAL ECHOCARDIOGRAM;  Surgeon: Lance Isaac, MD;  Location: [place];  Service: Open Heart Surgery;  Laterality: N/A;  . Transthoracic echocardiogram  06/29/2013    Focal basal hypertrophy with normal LV size. EF 60-65% no  regional WMA. Mild LA dilation  . Left heart catheterization with coronary angiogram N/A 06/11/2011    Procedure: LEFT HEART CATHETERIZATION WITH CORONARY ANGIOGRAM;  Surgeon: Lance Reek, MD;  Location: [facility] CATH LAB;  Service: Cardiovascular;  Laterality: N/A;  . Percutaneous coronary stent intervention (pci-s)  06/11/2011    Procedure: PERCUTANEOUS CORONARY STENT INTERVENTION (PCI-S);  Surgeon: Lance Reek, MD;  Location: [facility] CATH LAB;  Service: Cardiovascular;;  . Left heart catheterization with coronary angiogram N/A 06/28/2013    Procedure: LEFT HEART CATHETERIZATION WITH CORONARY ANGIOGRAM;  Surgeon: Lance Man, MD;  Location: [facility] CATH LAB;  Service: Cardiovascular;  Laterality: N/A;  . Coronary artery bypass graft N/A 07/01/2013    Procedure: CORONARY ARTERY BYPASS GRAFTING (CABG);  Surgeon: Lance Isaac, MD;  Location: [place];  Service: Open Heart Surgery;  Laterality: N/A;  Times 3 using left internal mammary artery and endoscopically harvested bilateral saphenous vein  . Left and right heart catheterization with coronary/graft angiogram N/A 10/03/2014    Procedure: LEFT AND RIGHT HEART CATHETERIZATION WITH Beatrix Fetters;  Surgeon: Lance Man, MD;  Location: [facility] CATH LAB;  Service: Cardiovascular;  Laterality: N/A;  . Percutaneous coronary stent intervention (pci-s) N/A 10/12/2014    Procedure: PERCUTANEOUS CORONARY STENT INTERVENTION (PCI-S);  Surgeon: Lance Man, MD;  Location: [facility] CATH LAB;  Ostial-Prox SVG-RCA Xience Alpine DES 3.0 mm x 38 mm; (3.4  - 3.3 mm)  . Nm myoview ltd  10/2014    LEXISCAN: Normal EF, 61%, No ischemia or infarction.  Mild diaphragmatic attenuation    Interval History:  Lance Collier returns today for a followup after his stress test. He is also here now after his lunch related to to [place] and [place]. He thoroughly enjoyed his trip, visiting there is more warm to historical sites. He obviously noted that he had exertional dyspnea walking  around to all the sites. He said he had to stop what it, but was really mostly troubled by exertional claudication as opposed to dyspnea. He still has some mild edema but it has improved. He continues to deny any chest tightness or pressure with rest or exertion to suggest angina, but he did not note angina before his prior PCI and CABG. Both of those occasions also took some time for him to note improved symptoms. He is doing cardiac rehabilitation 3 days a week and is thoroughly enjoying it. It actually makes him feel better to do this and he does note that his energy level has improved. He actually is overall pretty good mood. 1 he does notice he has a hard time getting his heart rate up on an as maintain heart rates in the 70-80 beats per minute when exercising. He does about 6-15 minutes on the treadmill versus exercise bike and is mostly bothered by more by claudication than actual dyspnea.  After having  increased his Lasix dose, he really does not note as much orthopnea and no PND. He still has mild edema.  No palpitations, lightheadedness, dizziness, weakness or syncope/near syncope. No TIA/amaurosis fugax symptoms. No melena, hematochezia, hematuria, or epstaxis. A lot of his walking is also very much limited by claudication. Recommendation by Julianne Handler is to continue to walk in order to promote collateralization.  ROS: A comprehensive was performed. Review of Systems  Constitutional: Positive for weight loss (Just cannot figure out how to loose weight -- has notably changed eating habits, just not able to burn enough  calories) and malaise/fatigue (Improving energy each week).  HENT: Negative for nosebleeds.   Eyes:       He does have poor vision associated with diabetes  Respiratory: Positive for shortness of breath. Negative for cough.   Cardiovascular: Positive for claudication and leg swelling. Negative for orthopnea.  Gastrointestinal: Negative for blood in stool and melena.    Genitourinary: Negative for hematuria.  Musculoskeletal: Positive for joint pain.  Neurological: Positive for dizziness (When he overexerts). Negative for headaches.  Psychiatric/Behavioral: Positive for depression (Much less demoralized.).  All other systems reviewed and are negative.   Current Outpatient Prescriptions on File Prior to Visit  Medication Sig Dispense Refill  . acetaminophen (TYLENOL) 325 MG tablet Take 650 mg by mouth every 4 (four) hours as needed for mild pain.     Marland Kitchen amLODipine (NORVASC) 10 MG tablet TAKE 1 TABLET ONCE DAILY. 30 tablet 9  . aspirin EC 81 MG tablet Take 1 tablet (81 mg total) by mouth every other day.    . B-D ULTRAFINE III SHORT PEN 31G X 8 MM MISC See admin instructions.  0  . clopidogrel (PLAVIX) 75 MG tablet Take 1 tablet (75 mg total) by mouth daily with breakfast. 30 tablet 11  . CRESTOR 10 MG tablet TAKE ONE TABLET EVERY OTHER DAY. 15 tablet 11  . fenofibrate 160 MG tablet Take 160 mg by mouth daily.    . furosemide (LASIX) 40 MG tablet Take 1 tablet (40 mg total) by mouth daily. (Patient taking differently: Take 40 mg by mouth 2 (two) times daily. ) 30 tablet 11  . HUMALOG KWIKPEN 100 UNIT/ML SOPN Inject 10-15 Units into the skin 3 (three) times daily. Sliding scale. 10 units with breakfast and 15 units with lunch and supper    . hydrALAZINE (APRESOLINE) 100 MG tablet Take 1 tablet (100 mg total) by mouth 2 (two) times daily. 60 tablet 11  . insulin glargine (LANTUS) 100 UNIT/ML injection Inject 40-70 Units into the skin 2 (two) times daily. 40 units in am, 70 units in pm    . isosorbide mononitrate (IMDUR) 60 MG 24 hr tablet Take 1 tablet (60 mg total) by mouth daily. 30 tablet 11  . NITROSTAT 0.4 MG SL tablet DISSOLVE 1 TABLET UNDER TONGUE AS NEEDED FOR CHEST PAIN,MAY REPEAT IN5 MINUTES FOR 2 DOSES. 25 tablet 1  . pantoprazole (PROTONIX) 40 MG tablet Take 40 mg by mouth daily.     Current Facility-Administered Medications on File Prior to Visit   Medication Dose Route Frequency Provider Last Rate Last Dose  . 0.9 %  sodium chloride infusion   Intravenous Continuous Lance Man, MD       Allergies  Allergen Reactions  . Atorvastatin Other (See Comments)    Leg pain  . Ibuprofen Hives  . Pravastatin   . Simvastatin Other (See Comments)    Leg pain  Social History  Substance Use Topics  . Smoking status: Former Smoker -- 2.00 packs/day for 27 years    Types: Cigarettes    Quit date: 11/23/1996  . Smokeless tobacco: Never Used  . Alcohol Use: 0.0 oz/week     Comment: 10/02/2014  "some weeks I might have 2-3 drinks then go for a month and not have any"   Family History  Problem Relation Age of Onset  . Cancer Sister     Lung cancer  . Cancer Mother   . Cancer Father   . Hyperlipidemia Father   . Hypertension Father   . Cancer Maternal Grandmother   . Cancer Paternal Grandfather     Wt Readings from Last 3 Encounters:  02/16/15 213 lb (96.616 kg)  12/28/14 212 lb (96.163 kg)  12/07/14 215 lb 2.7 oz (97.6 kg)    PHYSICAL EXAM BP 138/60 mmHg  Pulse 54  Ht 5\' 8"  (1.727 m)  Wt 213 lb (96.616 kg)  BMI 32.39 kg/m2 General appearance: alert, cooperative, appears stated age, no distress and Well-nourished, and well groomed. HEENT: [place]/AT, EOMI, MMM, anicteric sclera Neck: no adenopathy, no carotid bruit, mild JVD (with HJR) and supple, symmetrical, trachea midline Lungs: CTA B., normal percussion bilaterally and Nonlabored, good air movement - no obvious rales Heart: RRR,  S1& S2 normal, no murmur, click, rub or gallop, normal apical impulse and Mild discomfort left precordial wall and along the sternum. Well-healed scar. Abdomen: soft, non-tender; bowel sounds normal; no masses, no organomegaly and Rotund abdomen Extremities: 1-2++ bilaterally. and no ulcers, gangrene or trophic changes Pulses: mildly diminished pedal pulses bilaterally but 2+ bounding radial pulses bilaterally Neurologic: Grossly normal;  cranial nerves grossly intact  Cardiac rehabilitation report notes blood pressures ranging from 1 teens to low 140s over mostly 50s and 60s.   Adult ECG Report  Rate: 56 ;  Rhythm: sinus bradycardia and LVH by voltage. Nonspecific ST and T wave abnormalities. Normal axis, intervals. Otherwise normal EKG  Narrative Interpretation: relatively normal EKG  Recent Labs:      Chemistry      Component Value Date/Time   NA 137 11/03/2014 0958   K 4.7 11/03/2014 0958   CL 102 11/03/2014 0958   CO2 27 11/03/2014 0958   BUN 31* 11/03/2014 0958   CREATININE 1.72* 11/03/2014 0958   CREATININE 1.92* 10/13/2014 0428      Component Value Date/Time   CALCIUM 9.5 11/03/2014 0958   ALKPHOS 55 09/21/2014 1100   AST 29 09/21/2014 1100   ALT 40 09/21/2014 1100   BILITOT 0.3 09/21/2014 1100     Lab Results  Component Value Date   CHOL 193 09/20/2014   HDL 22* 09/20/2014   LDLCALC 98 09/20/2014   TRIG 367* 09/20/2014   CHOLHDL 8.8 09/20/2014    ASSESSMENT / PLAN: Problem List Items Addressed This Visit    CAD S/P PCI SVG-RCA Xience Alpine DES 3.0 mm x 38 mm (ostial-prox) - 3.104mm - Primary (Chronic)    Status post recent PCI to bring the graft. Finally starting to show signs of symptomatic improvement. Remains on aspirin plus Plavix with no bleeding complications. He is taking Crestor at low dosing every other day along with fenofibrate. On stable dose of carvedilol, however I am concerned about his inability to increase his heart rate up. I recommend he decrease his morning dose of carvedilol by one half prior to exercise. Noninvasive/therapy due to renal disease. He is on amlodipine at max dose.  Relevant Medications   VASCEPA 1 G CAPS   carvedilol (COREG) 12.5 MG tablet   Chronic diastolic CHF (congestive heart failure), NYHA class 3 (Chronic)    He still has notable exertional dyspnea probably from deconditioning and claudication related, however I do agree with the increased dosing  of Lasix from his nephrologist. If we do reduce his carvedilol dose, he may need additional blood pressure control. He is already on full dose hydralazine plus Imdur. Would defer to his nephrologist adjust meds if necessary.      Relevant Medications   VASCEPA 1 G CAPS   carvedilol (COREG) 12.5 MG tablet   DOE (dyspnea on exertion) (Chronic)    Notably improved. No ischemia on Myoview. Clearly related to diastolic dysfunction as well as deconditioning and obesity. Continue cardiac rehabilitation and diet/exercise regimen.      Edema of both legs (Chronic)    On Lasix. Using support stockings as well. He may need to increase to full compression stockings.  I will defer diuretic dosing to his nephrologist.      Essential hypertension (Chronic)    Stable blood pressure for now. May need additional blood pressure control if we reduce his carvedilol dose on. Closely monitor. I'm not sure what the next best option would be since his argon amlodipine, carvedilol and hydralazine/nitrate. Not using any of the lower ascending or due to his renal insufficiency.      Relevant Medications   VASCEPA 1 G CAPS   carvedilol (COREG) 12.5 MG tablet   Left main coronary artery disease; with RCA in-stent restenosis (Chronic)     Continued aggressive risk factor modification-- lipid, weight, blood pressure and diabetes control. Currently on a relatively stable regimen.      Relevant Medications   VASCEPA 1 G CAPS   carvedilol (COREG) 12.5 MG tablet   Obesity (BMI 30-39.9) (Chronic)    He is definitely trying to exercise and clearly is monitoring his diet, however I think he probably needs to cut back his portion sizes and this is the most difficult thing for him.      PAD (peripheral artery disease) both Rt and Lt disease with Lt ICA of 60-79% (Chronic)    Followed by vascular surgery. Continued exercise. May consider using Pletal as this would also potentially beneficial for CAD.      Relevant  Medications   VASCEPA 1 G CAPS   carvedilol (COREG) 12.5 MG tablet      Meds ordered this encounter  Medications  . VASCEPA 1 G CAPS    Sig: Take 2 g by mouth 2 (two) times daily. Take 2 capsules twice each day.    Refill:  4  . carvedilol (COREG) 12.5 MG tablet    Sig: TAKE 1/2 TABLET IN MORNING AND 1 TABLET IN THE EVENING    Dispense:  60 tablet    Refill:  11    PATIENT INSTRUCTIONS: TAKE YOUR CARVEDILOL (COREG) AFTER YOUR MORNING EXERCISE  DECREASE MEDICATION TO 6.25 MG (1/2 TABLET) MORNING DOSE  KEEP BLOOD PRESSURE  UNDER 140/90 , IF PRESSURE CONTINUE TO RISE GO BACK TO WHOLE TABLET.  IF WEIGHT INCREASE ABOVE 3 LBS. YOU MAY TAKE AN ADDITIONAL DOSE OF LASIX ( FUROSEMIDE). TRY TO WEAR YOUR SUPPORT HOSE/COMPRESSION SOCKS AS MUCH AS POSSIBLE   Your physician wants you to follow-up in    JAN/FEB 2017  WITH DR HARDING - 30 MIN APPOINTMENT.   Lance Collier, M.D., M.S. Interventional Cardiologist   Pager # [phone]

## 2015-02-16 NOTE — Patient Instructions (Addendum)
TAKE YOUR CARVEDILOL (COREG) AFTER YOUR MORNING EXERCISE  DECREASE MEDICATION TO 6.25 MG (1/2 TABLET) MORNING DOSE  KEEP BLOOD PRESSURE  UNDER 140/90 , IF PRESSURE CONTINUE TO RISE GO BACK TO WHOLE TABLET.  IF WEIGHT INCREASE ABOVE 3 LBS. YOU MAY TAKE AN ADDITIONAL DOSE OF LASIX ( FUROSEMIDE). TRY TO WEAR YOUR SUPPORT HOSE/COMPRESSION SOCKS AS MUCH AS POSSIBLE   Your physician wants you to follow-up in    JAN/FEB 2017  WITH DR HARDING - 30 MIN APPOINTMENT.  You will receive a reminder letter in the mail two months in advance. If you don't receive a letter, please call our office to schedule the follow-up appointment. Marland Kitchen

## 2015-02-18 ENCOUNTER — Encounter: Payer: Self-pay | Admitting: Cardiology

## 2015-02-18 NOTE — Assessment & Plan Note (Signed)
He is definitely trying to exercise and clearly is monitoring his diet, however I think he probably needs to cut back his portion sizes and this is the most difficult thing for him.

## 2015-02-18 NOTE — Assessment & Plan Note (Signed)
He still has notable exertional dyspnea probably from deconditioning and claudication related, however I do agree with the increased dosing of Lasix from his nephrologist. If we do reduce his carvedilol dose, he may need additional blood pressure control. He is already on full dose hydralazine plus Imdur. Would defer to his nephrologist adjust meds if necessary.

## 2015-02-18 NOTE — Assessment & Plan Note (Signed)
Stable blood pressure for now. May need additional blood pressure control if we reduce his carvedilol dose on. Closely monitor. I'm not sure what the next best option would be since his argon amlodipine, carvedilol and hydralazine/nitrate. Not using any of the lower ascending or due to his renal insufficiency.

## 2015-02-18 NOTE — Assessment & Plan Note (Signed)
Followed by vascular surgery. Continued exercise. May consider using Pletal as this would also potentially beneficial for CAD.

## 2015-02-18 NOTE — Assessment & Plan Note (Signed)
Status post recent PCI to bring the graft. Finally starting to show signs of symptomatic improvement. Remains on aspirin plus Plavix with no bleeding complications. He is taking Crestor at low dosing every other day along with fenofibrate. On stable dose of carvedilol, however I am concerned about his inability to increase his heart rate up. I recommend he decrease his morning dose of carvedilol by one half prior to exercise. Noninvasive/therapy due to renal disease. He is on amlodipine at max dose.

## 2015-02-18 NOTE — Assessment & Plan Note (Signed)
Notably improved. No ischemia on Myoview. Clearly related to diastolic dysfunction as well as deconditioning and obesity. Continue cardiac rehabilitation and diet/exercise regimen.

## 2015-02-18 NOTE — Assessment & Plan Note (Signed)
Continued aggressive risk factor modification-- lipid, weight, blood pressure and diabetes control. Currently on a relatively stable regimen.

## 2015-02-18 NOTE — Assessment & Plan Note (Signed)
On Lasix. Using support stockings as well. He may need to increase to full compression stockings.  I will defer diuretic dosing to his nephrologist.

## 2015-02-19 ENCOUNTER — Encounter (HOSPITAL_COMMUNITY)
Admission: RE | Admit: 2015-02-19 | Discharge: 2015-02-19 | Disposition: A | Discharge status: Home / Self Care | Payer: Medicare Other | Source: Ambulatory Visit | Attending: Cardiology | Admitting: Cardiology

## 2015-02-19 DIAGNOSIS — Z955 Presence of coronary angioplasty implant and graft: Secondary | ICD-10-CM | POA: Diagnosis not present

## 2015-02-19 DIAGNOSIS — Z48812 Encounter for surgical aftercare following surgery on the circulatory system: Secondary | ICD-10-CM | POA: Diagnosis not present

## 2015-02-21 ENCOUNTER — Encounter (HOSPITAL_COMMUNITY)
Admission: RE | Admit: 2015-02-21 | Discharge: 2015-02-21 | Disposition: A | Discharge status: Home / Self Care | Payer: Medicare Other | Source: Ambulatory Visit | Attending: Cardiology | Admitting: Cardiology

## 2015-02-21 DIAGNOSIS — Z48812 Encounter for surgical aftercare following surgery on the circulatory system: Secondary | ICD-10-CM | POA: Diagnosis not present

## 2015-02-21 DIAGNOSIS — Z955 Presence of coronary angioplasty implant and graft: Secondary | ICD-10-CM | POA: Diagnosis not present

## 2015-02-21 LAB — GLUCOSE, CAPILLARY: GLUCOSE-CAPILLARY: 279 mg/dL — AB (ref 65–99)

## 2015-02-23 ENCOUNTER — Encounter (HOSPITAL_COMMUNITY)
Admission: RE | Admit: 2015-02-23 | Discharge: 2015-02-23 | Disposition: A | Discharge status: Home / Self Care | Payer: Medicare Other | Source: Ambulatory Visit | Attending: Cardiology | Admitting: Cardiology

## 2015-02-23 DIAGNOSIS — Z955 Presence of coronary angioplasty implant and graft: Secondary | ICD-10-CM | POA: Insufficient documentation

## 2015-02-23 DIAGNOSIS — Z48812 Encounter for surgical aftercare following surgery on the circulatory system: Secondary | ICD-10-CM | POA: Insufficient documentation

## 2015-02-28 ENCOUNTER — Encounter (HOSPITAL_COMMUNITY)
Admission: RE | Admit: 2015-02-28 | Discharge: 2015-02-28 | Disposition: A | Discharge status: Home / Self Care | Payer: Medicare Other | Source: Ambulatory Visit | Attending: Cardiology | Admitting: Cardiology

## 2015-02-28 DIAGNOSIS — L820 Inflamed seborrheic keratosis: Secondary | ICD-10-CM | POA: Diagnosis not present

## 2015-02-28 DIAGNOSIS — Z955 Presence of coronary angioplasty implant and graft: Secondary | ICD-10-CM | POA: Diagnosis not present

## 2015-02-28 DIAGNOSIS — Z48812 Encounter for surgical aftercare following surgery on the circulatory system: Secondary | ICD-10-CM | POA: Diagnosis not present

## 2015-02-28 DIAGNOSIS — L570 Actinic keratosis: Secondary | ICD-10-CM | POA: Diagnosis not present

## 2015-02-28 LAB — GLUCOSE, CAPILLARY: Glucose-Capillary: 199 mg/dL — ABNORMAL HIGH (ref 65–99)

## 2015-03-02 ENCOUNTER — Encounter (HOSPITAL_COMMUNITY)
Admission: RE | Admit: 2015-03-02 | Discharge: 2015-03-02 | Disposition: A | Discharge status: Home / Self Care | Payer: Medicare Other | Source: Ambulatory Visit | Attending: Cardiology | Admitting: Cardiology

## 2015-03-02 DIAGNOSIS — Z48812 Encounter for surgical aftercare following surgery on the circulatory system: Secondary | ICD-10-CM | POA: Diagnosis not present

## 2015-03-02 DIAGNOSIS — Z955 Presence of coronary angioplasty implant and graft: Secondary | ICD-10-CM | POA: Diagnosis not present

## 2015-03-05 ENCOUNTER — Encounter (HOSPITAL_COMMUNITY)
Admission: RE | Admit: 2015-03-05 | Discharge: 2015-03-05 | Disposition: A | Discharge status: Home / Self Care | Payer: Medicare Other | Source: Ambulatory Visit | Attending: Cardiology | Admitting: Cardiology

## 2015-03-05 DIAGNOSIS — Z48812 Encounter for surgical aftercare following surgery on the circulatory system: Secondary | ICD-10-CM | POA: Diagnosis not present

## 2015-03-05 DIAGNOSIS — Z955 Presence of coronary angioplasty implant and graft: Secondary | ICD-10-CM | POA: Diagnosis not present

## 2015-03-07 ENCOUNTER — Encounter (HOSPITAL_COMMUNITY)
Admission: RE | Admit: 2015-03-07 | Discharge: 2015-03-07 | Disposition: A | Discharge status: Home / Self Care | Payer: Medicare Other | Source: Ambulatory Visit | Attending: Cardiology | Admitting: Cardiology

## 2015-03-07 DIAGNOSIS — Z48812 Encounter for surgical aftercare following surgery on the circulatory system: Secondary | ICD-10-CM | POA: Diagnosis not present

## 2015-03-07 DIAGNOSIS — Z955 Presence of coronary angioplasty implant and graft: Secondary | ICD-10-CM | POA: Diagnosis not present

## 2015-03-07 NOTE — Progress Notes (Signed)
60 day Psychosocial Assessment Pt continues to made slow progress.  Pt with good and bad days with his PAD.  Pt takes frequent rest breaks while ambulating on the treadmill.  Pt participates in consistent home exercise. 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  No further psychosocial needs identified, no further intervention needed. Cherre Huger, BSN

## 2015-03-09 ENCOUNTER — Encounter (HOSPITAL_COMMUNITY)
Admission: RE | Admit: 2015-03-09 | Discharge: 2015-03-09 | Disposition: A | Discharge status: Home / Self Care | Payer: Medicare Other | Source: Ambulatory Visit | Attending: Cardiology | Admitting: Cardiology

## 2015-03-09 DIAGNOSIS — Z955 Presence of coronary angioplasty implant and graft: Secondary | ICD-10-CM | POA: Diagnosis not present

## 2015-03-09 DIAGNOSIS — Z48812 Encounter for surgical aftercare following surgery on the circulatory system: Secondary | ICD-10-CM | POA: Diagnosis not present

## 2015-03-12 ENCOUNTER — Encounter (HOSPITAL_COMMUNITY)
Admission: RE | Admit: 2015-03-12 | Discharge: 2015-03-12 | Disposition: A | Discharge status: Home / Self Care | Payer: Medicare Other | Source: Ambulatory Visit | Attending: Cardiology | Admitting: Cardiology

## 2015-03-12 DIAGNOSIS — Z48812 Encounter for surgical aftercare following surgery on the circulatory system: Secondary | ICD-10-CM | POA: Diagnosis not present

## 2015-03-12 DIAGNOSIS — Z955 Presence of coronary angioplasty implant and graft: Secondary | ICD-10-CM | POA: Diagnosis not present

## 2015-03-14 ENCOUNTER — Encounter (HOSPITAL_COMMUNITY)
Admission: RE | Admit: 2015-03-14 | Discharge: 2015-03-14 | Disposition: A | Discharge status: Home / Self Care | Payer: Medicare Other | Source: Ambulatory Visit | Attending: Cardiology | Admitting: Cardiology

## 2015-03-14 DIAGNOSIS — Z955 Presence of coronary angioplasty implant and graft: Secondary | ICD-10-CM | POA: Diagnosis not present

## 2015-03-14 DIAGNOSIS — Z48812 Encounter for surgical aftercare following surgery on the circulatory system: Secondary | ICD-10-CM | POA: Diagnosis not present

## 2015-03-16 ENCOUNTER — Encounter (HOSPITAL_COMMUNITY)
Admission: RE | Admit: 2015-03-16 | Discharge: 2015-03-16 | Disposition: A | Discharge status: Home / Self Care | Payer: Medicare Other | Source: Ambulatory Visit | Attending: Cardiology | Admitting: Cardiology

## 2015-03-16 DIAGNOSIS — Z48812 Encounter for surgical aftercare following surgery on the circulatory system: Secondary | ICD-10-CM | POA: Diagnosis not present

## 2015-03-16 DIAGNOSIS — Z955 Presence of coronary angioplasty implant and graft: Secondary | ICD-10-CM | POA: Diagnosis not present

## 2015-03-16 NOTE — Progress Notes (Signed)
Pt graduated from cardiac rehab program today with completion of 36 exercise sessions in Phase II. Pt maintained good attendance and education classes.  Pt is a former participant in cardiac rehab. Pt  progressed fair  during his participation in rehab due to PAD as evidenced by increased MET level.  Pt increased from 2.7 to 3.0 MET level. Medication list reconciled. Repeat  PHQ score-0  .  Pt has made some lifestyle changes and should be commended for his success. Pt is generally a "happy go lucky" disposition and often is seen joking around with fellow participants. Pt feels he has achieved his short term goal to get back to regular exercise.  Pt exercises at two gyms during his off days from rehab. Pt long term goal is partially met.  Pt admits his SOB is greatly improved but the pain in his legs persist.  Pt continues to try to ambulate and some days is able to get to four minutes before he needs to take a break.   Pt plans to continue exercise in cardiac maintenance program after he returns from vacation about three weeks. Cherre Huger, BSN

## 2015-04-11 ENCOUNTER — Telehealth: Payer: Self-pay | Admitting: *Deleted

## 2015-04-11 NOTE — Telephone Encounter (Signed)
SIGNED -FAXED  [place]

## 2015-04-23 DIAGNOSIS — E1139 Type 2 diabetes mellitus with other diabetic ophthalmic complication: Secondary | ICD-10-CM | POA: Diagnosis not present

## 2015-04-23 DIAGNOSIS — E113593 Type 2 diabetes mellitus with proliferative diabetic retinopathy without macular edema, bilateral: Secondary | ICD-10-CM | POA: Diagnosis not present

## 2015-05-16 DIAGNOSIS — H26493 Other secondary cataract, bilateral: Secondary | ICD-10-CM | POA: Diagnosis not present

## 2015-05-16 DIAGNOSIS — Z961 Presence of intraocular lens: Secondary | ICD-10-CM | POA: Diagnosis not present

## 2015-05-22 DIAGNOSIS — N183 Chronic kidney disease, stage 3 (moderate): Secondary | ICD-10-CM | POA: Diagnosis not present

## 2015-05-22 DIAGNOSIS — I10 Essential (primary) hypertension: Secondary | ICD-10-CM | POA: Diagnosis not present

## 2015-05-22 DIAGNOSIS — I739 Peripheral vascular disease, unspecified: Secondary | ICD-10-CM | POA: Diagnosis not present

## 2015-05-22 DIAGNOSIS — I2510 Atherosclerotic heart disease of native coronary artery without angina pectoris: Secondary | ICD-10-CM | POA: Diagnosis not present

## 2015-05-22 DIAGNOSIS — E119 Type 2 diabetes mellitus without complications: Secondary | ICD-10-CM | POA: Diagnosis not present

## 2015-05-24 DIAGNOSIS — H26493 Other secondary cataract, bilateral: Secondary | ICD-10-CM | POA: Diagnosis not present

## 2015-06-04 DIAGNOSIS — I6529 Occlusion and stenosis of unspecified carotid artery: Secondary | ICD-10-CM | POA: Diagnosis not present

## 2015-06-04 DIAGNOSIS — Z23 Encounter for immunization: Secondary | ICD-10-CM | POA: Diagnosis not present

## 2015-06-04 DIAGNOSIS — I5032 Chronic diastolic (congestive) heart failure: Secondary | ICD-10-CM | POA: Diagnosis not present

## 2015-06-04 DIAGNOSIS — Z6831 Body mass index (BMI) 31.0-31.9, adult: Secondary | ICD-10-CM | POA: Diagnosis not present

## 2015-06-04 DIAGNOSIS — E1139 Type 2 diabetes mellitus with other diabetic ophthalmic complication: Secondary | ICD-10-CM | POA: Diagnosis not present

## 2015-06-04 DIAGNOSIS — N183 Chronic kidney disease, stage 3 (moderate): Secondary | ICD-10-CM | POA: Diagnosis not present

## 2015-06-04 DIAGNOSIS — E784 Other hyperlipidemia: Secondary | ICD-10-CM | POA: Diagnosis not present

## 2015-06-04 DIAGNOSIS — I25810 Atherosclerosis of coronary artery bypass graft(s) without angina pectoris: Secondary | ICD-10-CM | POA: Diagnosis not present

## 2015-06-04 DIAGNOSIS — E1142 Type 2 diabetes mellitus with diabetic polyneuropathy: Secondary | ICD-10-CM | POA: Diagnosis not present

## 2015-06-04 DIAGNOSIS — D509 Iron deficiency anemia, unspecified: Secondary | ICD-10-CM | POA: Diagnosis not present

## 2015-06-04 DIAGNOSIS — I739 Peripheral vascular disease, unspecified: Secondary | ICD-10-CM | POA: Diagnosis not present

## 2015-06-04 DIAGNOSIS — R946 Abnormal results of thyroid function studies: Secondary | ICD-10-CM | POA: Diagnosis not present

## 2015-06-15 ENCOUNTER — Other Ambulatory Visit: Payer: Self-pay | Admitting: Cardiology

## 2015-06-15 NOTE — Telephone Encounter (Signed)
Rx request sent to pharmacy.  

## 2015-06-21 DIAGNOSIS — Z23 Encounter for immunization: Secondary | ICD-10-CM | POA: Diagnosis not present

## 2015-06-29 ENCOUNTER — Encounter: Payer: Self-pay | Admitting: Family

## 2015-07-05 ENCOUNTER — Ambulatory Visit: Payer: Medicare Other | Admitting: Family

## 2015-07-05 ENCOUNTER — Encounter: Payer: Self-pay | Admitting: Vascular Surgery

## 2015-07-05 ENCOUNTER — Ambulatory Visit (INDEPENDENT_AMBULATORY_CARE_PROVIDER_SITE_OTHER)
Admission: RE | Admit: 2015-07-05 | Discharge: 2015-07-05 | Disposition: A | Discharge status: Home / Self Care | Payer: Medicare Other | Source: Ambulatory Visit | Attending: Vascular Surgery | Admitting: Vascular Surgery

## 2015-07-05 ENCOUNTER — Ambulatory Visit (HOSPITAL_COMMUNITY)
Admission: RE | Admit: 2015-07-05 | Discharge: 2015-07-05 | Disposition: A | Discharge status: Home / Self Care | Payer: Medicare Other | Source: Ambulatory Visit | Attending: Vascular Surgery | Admitting: Vascular Surgery

## 2015-07-05 ENCOUNTER — Ambulatory Visit (INDEPENDENT_AMBULATORY_CARE_PROVIDER_SITE_OTHER): Payer: Medicare Other | Admitting: Vascular Surgery

## 2015-07-05 DIAGNOSIS — I739 Peripheral vascular disease, unspecified: Secondary | ICD-10-CM | POA: Diagnosis not present

## 2015-07-05 DIAGNOSIS — I6523 Occlusion and stenosis of bilateral carotid arteries: Secondary | ICD-10-CM | POA: Insufficient documentation

## 2015-07-05 DIAGNOSIS — I70219 Atherosclerosis of native arteries of extremities with intermittent claudication, unspecified extremity: Secondary | ICD-10-CM

## 2015-07-05 NOTE — Addendum Note (Signed)
Addended by: Dorthula Rue L on: 07/05/2015 02:17 PM   Modules accepted: Orders

## 2015-07-05 NOTE — Progress Notes (Signed)
[facility] [place] HISTORY AND PHYSICAL    History of Present Illness:  Patient is a 70 y.o. year old male who presents for evaluation of asymptomatic carotid stenosis in bilateral lower extremity claudication.  His carotid stenosis was originally detected on workup for coronary bypass grafting. He denies any symptoms of TIA amaurosis or stroke. He also has bilateral lower extremity claudication. He states that he did get into cardiac rehabilitation in his walking distance had improved some. However, he recently required stenting of one of his coronary grafts and has lost some ground in the meanwhile. He is currently walking 30 minutes daily in the morning to try to improve his walking distance.  He experiences claudication after walking on a treadmill at 2 miles per hour for 9 minutes. He rests for about 30 seconds and is able to get up to a total time of 35 minutes. 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 Other medical problems include hypertension, diabetes, coronary disease, elevated cholesterol, migraine. These are all currently stable. He also has an element of renal dysfunction and was seen by Dr. Moshe Cipro recently. He states his baseline creatinine is around 2. He also has some diabetic retinopathy. He is on Plavix and aspirin and statin. He takes his aspirin every other day to try to reduce bleeding episodes to his eye. He also complains of intermittent swelling in both legs left greater than right this is somewhat improved by mild compression stockings.    Past Medical History   Diagnosis  Date   .  CAD S/P percutaneous coronary angioplasty  06/12/2011; 09/2104       a) 2012: Complex PCI mRCA ([facility]) --  2 overlapping Promus element DES stents.; 09/2014:  ost-prox SVG-rPDA - Xience DES 3.0 mm x 38 mm; (3.4  - 3.3 mm)   .  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  Likely thromboembolic event with PDA occlusion.  06/12/2011       Post Complex PCI   .  S/P CABG x 3  07/01/2013       a. 07/01/2013 (Cath for Crescendo Unstable Angina) --> [name]: For LM disease; LIMA-LAD, SVG-RCA, SCG-Cx; b. 09/2014 Cath/Staged PCI: patent LIMA->LAD, VG->OM1/RI, VG->RCA 80% (3.0x38 Xience Alpine DES on 10/12/2014).   Marland Kitchen  CAD (coronary artery disease), autologous vein bypass graft  09/2014       10/03/14: Cath for Class III-IV Angina -- 80% ostial-proximal SVG-RPDA (minimal flow down native PDA from native RCA with 50% proximal and distal, patent LIMA-LAD, SVG-OM with retrograde filling of OM 2. --> 10/12/14: Staged PCI of SVG-RPDA - Xience DES  3.0 mm x 38 mm; (3.4  - 3.3 mm)   .  Aortic sclerosis  - without Stenosis   March 2014       Echo: Aortic sclerosis without stenosis. EF 55-60% with normal WM; mild concentric hypertrophy with normal relaxation.   Marland Kitchen  PAD (peripheral artery disease)         with claudication   .  High cholesterol     .  Hypertension     .  GERD (gastroesophageal reflux disease)     .  Anxiety     .  Complication of anesthesia         "I've had name recall recognition problems since my last  anesthesia"   .  Heart murmur     .  Childhood asthma     .  DM (diabetes mellitus) type II uncontrolled with eye manifestation  2013       Diabetic retinopathy with retinal hemorrhages since;, recurrent in August 2014 treated with Avastin shots   .  Migraine  ~ 2004       "once"   .  CKD (chronic kidney disease), stage III         "mild; related to diabetes" (10/02/2014)   .  Anemia         Past Surgical History   Procedure  Laterality  Date   .  Tonsillectomy       .  Nm myoview ltd    January '13       Diaphragmatic attenuation versus inferior infarct. No ischemia. Normal EF normal wall motion.   Marland Kitchen  Nm myoview ltd    June 2014       no ischemia   .  Coronary angioplasty with stent placement  Right   September 2012       2 overlapping Promus DES 2.7 mm at 12 mm x2; postdilated to 3 mm   .  Cardiac catheterization    06/28/2013       ostial LM disese, RCA ~40-50%ISR   .  Retinal laser procedure  Bilateral         "more than once"    .  Intraoperative transesophageal echocardiogram  N/A  07/01/2013       Procedure: INTRAOPERATIVE TRANSESOPHAGEAL ECHOCARDIOGRAM;  Surgeon: Grace Isaac, MD;  Location: [facility];  Service: Open Heart Surgery;  Laterality: N/A;   .  Transthoracic echocardiogram    06/29/2013       Focal basal hypertrophy with normal LV size. EF 60-65% no regional WMA. Mild LA dilation   .  Left heart catheterization with coronary angiogram  N/A  06/11/2011       Procedure: LEFT HEART CATHETERIZATION WITH CORONARY ANGIOGRAM;  Surgeon: Fulton Reek, MD;  Location: [facility] CATH LAB;  Service: Cardiovascular;  Laterality: N/A;   .  Percutaneous coronary stent intervention (pci-s)    06/11/2011       Procedure: PERCUTANEOUS CORONARY STENT INTERVENTION (PCI-S);  Surgeon: Fulton Reek, MD;  Location[facility] CATH LAB;  Service: Cardiovascular;;   .  Left heart catheterization with coronary angiogram  N/A  06/28/2013       Procedure: LEFT HEART CATHETERIZATION WITH CORONARY ANGIOGRAM;  Surgeon: Leonie Man, MD;  Location: [facility] CATH LAB;  Service: Cardiovascular;  Laterality: N/A;   .  Coronary artery bypass graft  N/A  07/01/2013       Procedure: CORONARY ARTERY BYPASS GRAFTING (CABG);  Surgeon: Grace Isaac, MD;  Location: [facility];  Service: Open Heart Surgery;  Laterality: N/A;  Times 3 using left internal mammary artery and endoscopically harvested bilateral saphenous vein   .  Left and right heart catheterization with coronary/graft angiogram  N/A  10/03/2014       Procedure: LEFT AND RIGHT HEART CATHETERIZATION WITH Beatrix Fetters;  Surgeon: Leonie Man, MD;  Location: [facility] CATH LAB;  Service: Cardiovascular;  Laterality: N/A;   .  Percutaneous coronary stent intervention (pci-s)   N/A  10/12/2014       Procedure: PERCUTANEOUS CORONARY STENT INTERVENTION (PCI-S);  Surgeon: Leonie Man, MD;  Location[facility] CATH LAB;  Ostial-Prox SVG-RCA Xience Alpine DES 3.0 mm x 38 mm; (3.4  -  3.3 mm)    History      Social History   .  Marital Status:  Married       Spouse Name:  N/A   .  Number of Children:  N/A   .  Years of Education:  N/A      Occupational History   .  Not on file.      Social History Main Topics   .  Smoking status:  Former Smoker -- 2.00 packs/day for 27 years       Types:  Cigarettes       Quit date:  11/23/1996   .  Smokeless tobacco:  Never Used   .  Alcohol Use:  0.0 oz/week         Comment: 10/02/2014  "some weeks I might have 2-3 drinks then go for a month and not have any"   .  Drug Use:  No   .  Sexual Activity:  Not Currently      Other Topics  Concern   .  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 He notably has not been exercising like he used to. He [place] He has social alcoholic beverages but is trying to cut that back and does not smoke.           He tells me he is a sucker for chips but does not eat sweets because of diabetes.     Family History Family History    Problem   Relation   Age of Onset    .   Cancer   Sister             Lung cancer    .   Cancer   Mother       .   Cancer   Father       .   Hyperlipidemia   Father       .   Hypertension   Father       .   Cancer   Maternal Grandmother       .   Cancer   Paternal Grandfather         Allergies    Allergies    Allergen   Reactions    .   Atorvastatin   Other (See Comments)          Leg pain    .   Ibuprofen   Hives    .   Pravastatin       .   Simvastatin   Other (See Comments)          Leg pain     Current Outpatient Prescriptions on File Prior to Visit   Medication  Sig  Dispense  Refill   .  acetaminophen  (TYLENOL) 325 MG tablet  Take 650 mg by mouth every 4 (four) hours as needed for mild pain.        Marland Kitchen  amLODipine (NORVASC) 10 MG tablet  TAKE 1 TABLET ONCE DAILY.  30 tablet  9   .  aspirin EC 81 MG tablet  Take 1 tablet (81 mg total) by mouth every other day.       .  B-D ULTRAFINE III SHORT PEN 31G X 8 MM MISC  See admin instructions.  0   .  carvedilol (COREG) 12.5 MG tablet  Take 1 tablet (12.5 mg total) by mouth 2 (two) times daily.  60 tablet  11   .  clopidogrel (PLAVIX) 75 MG tablet  Take 1 tablet (75 mg total) by mouth daily with breakfast.  30 tablet  11   .  CRESTOR 10 MG tablet  TAKE ONE TABLET EVERY OTHER DAY.  15 tablet  11   .  fenofibrate 160 MG tablet  Take 160 mg by mouth daily.       .  furosemide (LASIX) 40 MG tablet  Take 1 tablet (40 mg total) by mouth daily. (Patient taking differently: Take 40 mg by mouth 2 (two) times daily. )  30 tablet  11   .  HUMALOG KWIKPEN 100 UNIT/ML SOPN  Inject 10-15 Units into the skin 3 (three) times daily. Sliding scale. 10 units with breakfast and 15 units with lunch and supper       .  hydrALAZINE (APRESOLINE) 100 MG tablet  Take 1 tablet (100 mg total) by mouth 2 (two) times daily.  60 tablet  11   .  insulin glargine (LANTUS) 100 UNIT/ML injection  Inject 40-70 Units into the skin 2 (two) times daily. 40 units in am, 70 units in pm       .  isosorbide mononitrate (IMDUR) 60 MG 24 hr tablet  Take 1 tablet (60 mg total) by mouth daily.  30 tablet  11   .  NITROSTAT 0.4 MG SL tablet  DISSOLVE 1 TABLET UNDER TONGUE AS NEEDED FOR CHEST PAIN,MAY REPEAT IN5 MINUTES FOR 2 DOSES.  25 tablet  1   .  pantoprazole (PROTONIX) 40 MG tablet  Take 40 mg by mouth daily.          Current Facility-Administered Medications on File Prior to Visit   Medication  Dose  Route  Frequency  Provider  Last Rate  Last Dose   .  0.9 %  sodium chloride infusion     Intravenous  Continuous  Leonie Man, MD           ROS:     Cardiac: He denies chest  pain.  Respiratory: He denies shortness of breath except with severe exertion  Urinary: [x ] chronic Kidney disease, [ ]  on HD - [ ]  MWF or [ ]  TTHS, [ ]  Burning with urination, [ ]  Frequent urination, [ ]  Difficulty urinating;      Physical Examination    Filed Vitals:   07/05/15 1124 07/05/15 1125  BP: 106/49 123/54  Pulse: 56   Temp: 97.3 F (36.3 C)   TempSrc: Oral   Resp: 16   Height: 5\' 9"  (1.753 m)   Weight: 212 lb (96.163 kg)     General:  Alert and oriented, no acute distress HEENT: Normal Neck: Soft left carotid bruit no right bruit Pulmonary: Clear to auscultation bilaterally Cardiac: Regular Rate and Rhythm without murmur Abdomen: Soft, non-tender, non-distended, no mass Skin: No rash, no skin cracks or ulcers on the feet Extremity Pulses:  2+ radial, brachial, femoral pulses bilaterally absent popliteal and pedal pulses bilaterally Musculoskeletal: No deformity or edema     Neurologic: Upper and lower extremity motor 5/5 and symmetric  DATA:  Patient had bilateral ABIs performed today which were 0.67 on the right 0.63 on the left biphasic to monophasic waveforms. He also had a carotid duplex exam which showed a 60-80% left internal carotid artery stenosis a 40%  right internal carotid artery stenosis which was unchanged .  he also had bilateral lower extremity arterial duplex scan. This showed a patent but narrowed right superficial femoral artery in the proximal and distal segment. He also had a 50-75% stenosis of the left common femoral artery and a greater than 75% stenosis of the distal left superficial femoral artery I reviewed and interpreted this study.   ASSESSMENT:  #1 claudication not currently lifestyle limiting satisfied with his walking program rather than intervention at this point #2 moderate carotid stenosis asymptomatic on antiplatelet and statin therapy   PLAN:  Carotid duplex scan in 6 months. Repeat ABIs and arterial duplex in 6 months. Continue  walking program and risk factor modification.  Ruta Hinds, MD [facility] [place] Office: [phone] Pager: [phone]

## 2015-08-22 ENCOUNTER — Ambulatory Visit (INDEPENDENT_AMBULATORY_CARE_PROVIDER_SITE_OTHER): Payer: Medicare Other | Admitting: Cardiology

## 2015-08-22 ENCOUNTER — Encounter: Payer: Self-pay | Admitting: Cardiology

## 2015-08-22 VITALS — BP 126/78 | HR 68 | Ht 69.0 in | Wt 210.0 lb

## 2015-08-22 DIAGNOSIS — I5032 Chronic diastolic (congestive) heart failure: Secondary | ICD-10-CM

## 2015-08-22 DIAGNOSIS — E669 Obesity, unspecified: Secondary | ICD-10-CM

## 2015-08-22 DIAGNOSIS — Z9861 Coronary angioplasty status: Secondary | ICD-10-CM | POA: Diagnosis not present

## 2015-08-22 DIAGNOSIS — I10 Essential (primary) hypertension: Secondary | ICD-10-CM

## 2015-08-22 DIAGNOSIS — I2510 Atherosclerotic heart disease of native coronary artery without angina pectoris: Secondary | ICD-10-CM

## 2015-08-22 DIAGNOSIS — I25810 Atherosclerosis of coronary artery bypass graft(s) without angina pectoris: Secondary | ICD-10-CM

## 2015-08-22 DIAGNOSIS — I739 Peripheral vascular disease, unspecified: Secondary | ICD-10-CM

## 2015-08-22 DIAGNOSIS — E785 Hyperlipidemia, unspecified: Secondary | ICD-10-CM

## 2015-08-22 DIAGNOSIS — I209 Angina pectoris, unspecified: Secondary | ICD-10-CM | POA: Diagnosis not present

## 2015-08-22 NOTE — Patient Instructions (Signed)
Your physician wants you to follow-up in:  6 months. You will receive a reminder letter in the mail two months in advance. If you don't receive a letter, please call our office to schedule the follow-up appointment.   

## 2015-08-22 NOTE — Progress Notes (Signed)
PCP: Sheela Stack, MD  Clinic Note: Chief Complaint  Patient presents with  . Other  . Edema    FEET AND ANKLES  . Dizziness    LAST WEEK  . Coronary Artery Disease  . PAD    HPI: Lance Collier is a 70 y.o. male with a PMH below who presents today for 6 month f/u of CAD-CABG-PCI & Chronic DHF (HF PEF). Occluded cardiac rehabilitation and is now at the gym working out doing treadmill and other weight lifting program. He has not tried to get his heart rate over 70-75 bpm, mostly because if he does any more than that he's can start having claudication.  Lance Collier was last seen in Aug 2016 -- chronic DOE & exertional claudication.  Upset about not loosing wgt. No further anginal symptoms following PCI. He had a good trip to [place]. We have evaluated continued symptoms following PCI with a Myoview that was negative for ischemia. It took him a couple weeks to get to where he was not noticing any angina. Since then he's not had anymore.  Recent Hospitalizations: na  Studies Reviewed:   [facility] ABI & Dopplers Jan 2017: RABI 0.67, LAB 0.63 -- Moderate occlusive Dz Bilaterally.; Patent right SFA with 30 and 49% stenosis in proximal segment and 7599 stenosis and distal segment. Hemodynamically significant stenosis (50-74%) in the left CFA and 7599% and distal left SFA.  -- Follow up appointment with Dr. Oneida Alar --suggested that claudication was not lysed a limiting and that he was satisfied with his walking program. Also noted moderate carotid stenosis was asymptomatic. Plan was to continue walking program and risk factor modification.  Interval History: Lance Collier presents today overall fairing relatively well from a cardiac standpoint. He had a little episode of dizziness last week and thinks he may been a little dehydrated. He was eating and drinking very well and did overdo it little bit. He also had an episode not associated with the dizziness that was short-lived palpitations  fell. Overall from a cardiac symptom standpoint, he is now walking on a treadmill for 30 days a day and is doing an hour on other exercises. He is really more limited by his leg claudication than dyspnea or chest pain.  He says he can walk about 6-8 minutes and then starts getting claudication worse on the left side than on the right.  No complaints of any chest tightness or pressure with rest or exertion. He does have some mild ankle edema but this is improved since increasing Lasix to 40 mg twice a day. This is managed by his nephrologist. Despite having edema, he has no PND or orthopnea. Only one episode of palpitations but otherwise no significant rapid or irregular heartbeats/rhythms. No syncope/near-syncope or TIA/cyanosis fugax symptoms.   ROS: A comprehensive was performed. Review of Systems  Constitutional: Negative for weight loss and malaise/fatigue.  HENT: Negative for nosebleeds.   Respiratory: Negative for cough, shortness of breath and wheezing.   Cardiovascular: Positive for claudication (may not be "lifestyle limiting as far as the fact that he can do some exercise, but is certainly limiting his activity.Marland Kitchen  He is reluctant to undergo any invasive procedures however because of his renal insufficiency.).  Gastrointestinal: Negative for blood in stool and melena.  Genitourinary: Negative for hematuria.  Musculoskeletal: Positive for back pain and joint pain (Knees, ankles). Negative for myalgias and falls.  Neurological: Negative for headaches.  Psychiatric/Behavioral: Negative for depression and memory loss. The patient has insomnia. The  patient is not nervous/anxious.     Past Medical History  Diagnosis Date  . CAD S/P percutaneous coronary angioplasty 06/12/2011; 09/2104    a) 2012: Complex PCI mRCA ([place]) --  2 overlapping Promus element DES stents.; 09/2014:  ost-prox SVG-rPDA - Xience DES 3.0 mm x 38 mm; (3.4  - 3.3 mm)  . Type IVa MI, peak Troponin 1.63 - peri-PCI  infarction during complex stenting of torutuos RCA.  Likely thromboembolic event with PDA occlusion. 06/12/2011    Post Complex PCI  . 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 07/01/2013 (Cath for [place]endo Unstable Angina) --> [name]: For LM disease; LIMA-LAD, SVG-RCA, SCG-Cx; b. 09/2014 Cath/Staged PCI: patent LIMA->LAD, VG->OM1/RI, VG->RCA 80% (3.0x38 Xience Alpine DES on 10/12/2014).  Marland Kitchen CAD (coronary artery disease), autologous vein bypass graft 09/2014    10/03/14: Cath for Class III-IV Angina -- 80% ostial-proximal SVG-RPDA (minimal flow down native PDA from native RCA with 50% proximal and distal, patent LIMA-LAD, SVG-OM with retrograde filling of OM 2. --> 10/12/14: Staged PCI of SVG-RPDA - Xience DES  3.0 mm x 38 mm; (3.4  - 3.3 mm)  . Aortic sclerosis  - without Stenosis  March 2014    Echo: Aortic sclerosis without stenosis. EF 55-60% with normal WM; mild concentric hypertrophy with normal relaxation.  Marland Kitchen PAD (peripheral artery disease) (HCC)     with claudication  . High cholesterol   . Hypertension   . GERD (gastroesophageal reflux disease)   . Anxiety   . Complication of anesthesia     "I've had name recall recognition problems since my last anesthesia"  . Heart murmur   . Childhood asthma   . DM (diabetes mellitus) type II uncontrolled with eye manifestation ([place]) 2013    Diabetic retinopathy with retinal hemorrhages since;, recurrent in August 2014 treated with Avastin shots  . Migraine ~ 2004    "once"  . CKD (chronic kidney disease), stage III     "mild; related to diabetes" (10/02/2014)  . Anemia     Past Surgical History  Procedure Laterality Date  . Tonsillectomy    . Nm myoview ltd  January '13    Diaphragmatic attenuation versus inferior infarct. No ischemia. Normal EF normal wall motion.  Marland Kitchen Nm myoview ltd  June 2014    no ischemia  . Coronary angioplasty with stent placement Right September 2012    2 overlapping Promus DES 2.7 mm at 12 mm x2; postdilated to 3 mm  . Cardiac  catheterization  06/28/2013    ostial LM disese, RCA ~40-50%ISR  . Retinal laser procedure Bilateral     "more than once"   . Intraoperative transesophageal echocardiogram N/A 07/01/2013    Procedure: INTRAOPERATIVE TRANSESOPHAGEAL ECHOCARDIOGRAM;  Surgeon: Grace Isaac, MD;  Location: [facility];  Service: Open Heart Surgery;  Laterality: N/A;  . Transthoracic echocardiogram  06/29/2013    Focal basal hypertrophy with normal LV size. EF 60-65% no regional WMA. Mild LA dilation  . Left heart catheterization with coronary angiogram N/A 06/11/2011    Procedure: LEFT HEART CATHETERIZATION WITH CORONARY ANGIOGRAM;  Surgeon: Fulton Reek, MD;  Location: [facility] CATH LAB;  Service: Cardiovascular;  Laterality: N/A;  . Percutaneous coronary stent intervention (pci-s)  06/11/2011    Procedure: PERCUTANEOUS CORONARY STENT INTERVENTION (PCI-S);  Surgeon: Fulton Reek, MD;  Location: [facility] CATH LAB;  Service: Cardiovascular;;  . Left heart catheterization with coronary angiogram N/A 06/28/2013    Procedure: LEFT HEART CATHETERIZATION WITH CORONARY ANGIOGRAM;  Surgeon: Leonie Man, MD;  Location: [facility] CATH LAB;  Service: Cardiovascular;  Laterality: N/A;  . Coronary artery bypass graft N/A 07/01/2013    Procedure: CORONARY ARTERY BYPASS GRAFTING (CABG);  Surgeon: Grace Isaac, MD;  Location: [facility];  Service: Open Heart Surgery;  Laterality: N/A;  Times 3 using left internal mammary artery and endoscopically harvested bilateral saphenous vein  . Left and right heart catheterization with coronary/graft angiogram N/A 10/03/2014    Procedure: LEFT AND RIGHT HEART CATHETERIZATION WITH Beatrix Fetters;  Surgeon: Leonie Man, MD;  Location: [facility] CATH LAB;  Service: Cardiovascular;  Laterality: N/A;  . Percutaneous coronary stent intervention (pci-s) N/A 10/12/2014    Procedure: PERCUTANEOUS CORONARY STENT INTERVENTION (PCI-S);  Surgeon: Leonie Man, MD;  Location: [facility] CATH LAB;  Ostial-Prox SVG-RCA Xience  Alpine DES 3.0 mm x 38 mm; (3.4  - 3.3 mm)  . Nm myoview ltd  10/2014    LEXISCAN: Normal EF, 61%, No ischemia or infarction.  Mild diaphragmatic attenuation  . Transthoracic echocardiogram  April 2016    Mod focal basal & mild overall LVH. EF 55-60%. Mild Ao Sclerosis, Mild MR with MAC. Only minimally elevated RVP.    Prior to Admission medications   Medication Sig Start Date End Date Taking? Authorizing Provider  acetaminophen (TYLENOL) 325 MG tablet Take 650 mg by mouth every 4 (four) hours as needed for mild pain.    Yes Historical Provider, MD  amLODipine (NORVASC) 10 MG tablet TAKE 1 TABLET ONCE DAILY. 06/15/15  Yes Leonie Man, MD  aspirin EC 81 MG tablet Take 1 tablet (81 mg total) by mouth every other day. 11/06/14  Yes Leonie Man, MD  B-D ULTRAFINE III SHORT PEN 31G X 8 MM MISC See admin instructions. 04/10/14  Yes Historical Provider, MD  carvedilol (COREG) 12.5 MG tablet Take 12.5 mg by mouth 2 (two) times daily with a meal.   Yes Historical Provider, MD  clopidogrel (PLAVIX) 75 MG tablet Take 1 tablet (75 mg total) by mouth daily with breakfast. 10/04/14  Yes Evelene Croon Barrett, PA-C  CRESTOR 10 MG tablet TAKE ONE TABLET EVERY OTHER DAY. 12/04/14  Yes Leonie Man, MD  fenofibrate 160 MG tablet Take 160 mg by mouth daily. 10/31/12  Yes Historical Provider, MD  furosemide (LASIX) 40 MG tablet Take 1 tablet (40 mg total) by mouth daily. Patient taking differently: Take 40 mg by mouth 2 (two) times daily.  10/30/14  Yes Leonie Man, MD  HUMALOG KWIKPEN 100 UNIT/ML SOPN Inject 10-15 Units into the skin 3 (three) times daily. Sliding scale. 10 units with breakfast and 15 units with lunch and supper 10/31/12  Yes Historical Provider, MD  hydrALAZINE (APRESOLINE) 100 MG tablet Take 1 tablet (100 mg total) by mouth 2 (two) times daily. 10/03/14  Yes Rhonda G Barrett, PA-C  insulin glargine (LANTUS) 100 UNIT/ML injection Inject 40-75 Units into the skin 2 (two) times daily. 40 units  in am, 75 units in pm   Yes Historical Provider, MD  isosorbide mononitrate (IMDUR) 60 MG 24 hr tablet Take 1 tablet (60 mg total) by mouth daily. 10/03/14  Yes Rhonda G Barrett, PA-C  NITROSTAT 0.4 MG SL tablet DISSOLVE 1 TABLET UNDER TONGUE AS NEEDED FOR CHEST PAIN,MAY REPEAT IN5 MINUTES FOR 2 DOSES. 11/01/14  Yes Leonie Man, MD  pantoprazole (PROTONIX) 40 MG tablet Take 40 mg by mouth daily.   Yes Historical Provider, MD  VASCEPA 1 G CAPS Take 2  g by mouth 2 (two) times daily. Take 2 capsules twice each day. 01/31/15  Yes Historical Provider, MD    Allergies  Allergen Reactions  . Atorvastatin Other (See Comments)    Leg pain  . Ibuprofen Hives  . Pravastatin   . Simvastatin Other (See Comments)    Leg pain    Social History   Social History  . Marital Status: Married    Spouse Name: N/A  . Number of Children: N/A  . Years of Education: N/A   Social History Main Topics  . Smoking status: Former Smoker -- 2.00 packs/day for 27 years    Types: Cigarettes    Quit date: 11/23/1996  . Smokeless tobacco: Never Used  . Alcohol Use: 0.0 oz/week     Comment: 10/02/2014  "some weeks I might have 2-3 drinks then go for a month and not have any"  . Drug Use: No  . Sexual Activity: Not Currently   Other Topics Concern  . 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 He notably has not been exercising like he used to. He does walk back and [place] He has social alcoholic beverages but is trying to cut that back and does not smoke.       He tells me he is a sucker for chips but does not eat sweets because of diabetes.    He is now starting to think very seriously about retiring. He is deathly cutting back his work schedule and just trying to tie up loose ends on the cases that are still open.   Family History  Problem Relation Age of Onset  . Cancer Sister     Lung  cancer  . Cancer Mother   . Cancer Father   . Hyperlipidemia Father   . Hypertension Father   . Cancer Maternal Grandmother   . Cancer Paternal Grandfather     Wt Readings from Last 3 Encounters:  08/22/15 210 lb (95.255 kg)  07/05/15 212 lb (96.163 kg)  02/16/15 213 lb (96.616 kg)    PHYSICAL EXAM BP 126/78 mmHg  Pulse 68  Ht 5\' 9"  (1.753 m)  Wt 210 lb (95.255 kg)  BMI 31.00 kg/m2 General appearance: alert, cooperative, appears stated age, no distress and Well-nourished, and well groomed. HEENT: [place]/AT, EOMI, MMM, anicteric sclera Neck: no adenopathy, no carotid bruit, mild JVD (with HJR) and supple, symmetrical, trachea midline Lungs: CTA B., normal percussion bilaterally and Nonlabored, good air movement - no obvious rales Heart: RRR, S1& S2 normal, no murmur, click, rub or gallop, normal apical impulse and Mild discomfort left precordial wall and along the sternum. Well-healed scar. Abdomen: soft, non-tender; bowel sounds normal; no masses, no organomegaly and Rotund abdomen Extremities: 1-2++ bilaterally. and no ulcers, gangrene or trophic changes Pulses: mildly diminished pedal pulses bilaterally but 2+ bounding radial pulses bilaterally Neurologic: Grossly normal; cranial nerves grossly intact   Adult ECG Report Not checked  Other studies Reviewed: Additional studies/ records that were reviewed today include:  Recent Labs:  Lab Results  Component Value Date   CHOL 193 09/20/2014   HDL 22* 09/20/2014   LDLCALC 98 09/20/2014   TRIG 367* 09/20/2014   CHOLHDL 8.8 09/20/2014   Lab Results  Component Value Date   CREATININE 1.72* 11/03/2014    ASSESSMENT / PLAN: Doing well !!   Problem List Items Addressed This Visit  PAD (peripheral artery disease) both Rt and Lt disease with Lt ICA of 60-79% (Chronic)    Still followed by Dr. Davene Costain [facility]. Reluctant to pursue any invasive treatment for his PAD in light of his renal insufficiency. At  current, they're not considering his claudication to be lifestyle limiting. He is continuing to walk as best he can. He is able to do routine ADLs, but his exercise is definitely limited by claudication.      Relevant Medications   carvedilol (COREG) 12.5 MG tablet   Obesity (BMI 30-39.9) (Chronic)    He is certainly trying to increase his level activity and exertion. BMI has reduced, but his weight has not. Biggest heart irregular be having back is portion sizes to reduce caloric intake.      Hyperlipidemia with target LDL less than 70; intolerance to atorvastatin and simvastatin (Chronic)    On Crestor every other day and fenofibrate. Triglycerides are still elevated, HDL is still low (both factors for metabolic syndrome in addition to hypertension and obesity) However his LDL is at least below 100. Target would be less than 70. May need to consider referral to the lipid clinic for PCSK9 inhibitor treatment.      Relevant Medications   carvedilol (COREG) 12.5 MG tablet   Essential hypertension (Chronic)    Stable blood pressure control on multiple medications.      Relevant Medications   carvedilol (COREG) 12.5 MG tablet   Coronary artery disease involving coronary bypass graft without angina pectoris (Chronic)    Controlled angina following PCI. Nonischemic Myoview. On stable regimen.      Relevant Medications   carvedilol (COREG) 12.5 MG tablet   Chronic diastolic CHF (congestive heart failure), NYHA class 2 (HCC) (Chronic)    Improved symptoms following his PCI. He is now more limited by his claudication than exertional dyspnea. No PND or orthopnea with minimal edema. Mains on twice a day Lasix as titrated by his nephrologist. He is on stable dose of beta blocker with hydralazine/nitrate for afterload reduction along with Norvasc. Gradually starting to get more exercise and less symptomatic.      Relevant Medications   carvedilol (COREG) 12.5 MG tablet   CAD S/P PCI SVG-RCA  Xience Alpine DES 3.0 mm x 38 mm (ostial-prox) - 3.86mm - Primary (Chronic)    Overall doing pretty well from a cardiac standpoint. No further anginal symptoms following his most recent PCI. Myoview shortly thereafter. He is on stable dose of carvedilol along with Imdur and amlodipine for additional antianginal effect. He still taking Plavix and aspirin. We can probably stop aspirin at next follow-up. He is taking Crestor plus fenofibrate      Relevant Medications   carvedilol (COREG) 12.5 MG tablet   Angina, class III (HCC) (Chronic)    Essentially resolved following PCI. Now Class I On stable regimen.      Relevant Medications   carvedilol (COREG) 12.5 MG tablet      Current medicines are reviewed at length with the patient today. (+/- concerns) none The following changes have been made: none  Studies Ordered:   No orders of the defined types were placed in this encounter.    ROV 6 months     Davina Howlett, Leonie Green, M.D., M.S. Interventional Cardiologist   Pager # [phone] Phone # [phone] [address] [place], [place] [address]

## 2015-08-25 ENCOUNTER — Encounter: Payer: Self-pay | Admitting: Cardiology

## 2015-08-25 NOTE — Assessment & Plan Note (Signed)
Improved symptoms following his PCI. He is now more limited by his claudication than exertional dyspnea. No PND or orthopnea with minimal edema. Mains on twice a day Lasix as titrated by his nephrologist. He is on stable dose of beta blocker with hydralazine/nitrate for afterload reduction along with Norvasc. Gradually starting to get more exercise and less symptomatic.

## 2015-08-25 NOTE — Assessment & Plan Note (Signed)
He is certainly trying to increase his level activity and exertion. BMI has reduced, but his weight has not. Biggest heart irregular be having back is portion sizes to reduce caloric intake.

## 2015-08-25 NOTE — Assessment & Plan Note (Signed)
Stable blood pressure control on multiple medications.

## 2015-08-25 NOTE — Assessment & Plan Note (Signed)
Essentially resolved following PCI. Now Class I On stable regimen.

## 2015-08-25 NOTE — Assessment & Plan Note (Signed)
Controlled angina following PCI. Nonischemic Myoview. On stable regimen.

## 2015-08-25 NOTE — Assessment & Plan Note (Signed)
Overall doing pretty well from a cardiac standpoint. No further anginal symptoms following his most recent PCI. Myoview shortly thereafter. He is on stable dose of carvedilol along with Imdur and amlodipine for additional antianginal effect. He still taking Plavix and aspirin. We can probably stop aspirin at next follow-up. He is taking Crestor plus fenofibrate

## 2015-08-25 NOTE — Assessment & Plan Note (Signed)
On Crestor every other day and fenofibrate. Triglycerides are still elevated, HDL is still low (both factors for metabolic syndrome in addition to hypertension and obesity) However his LDL is at least below 100. Target would be less than 70. May need to consider referral to the lipid clinic for PCSK9 inhibitor treatment.

## 2015-08-25 NOTE — Assessment & Plan Note (Signed)
Still followed by Dr. Davene [facility]ain [facility]. Reluctant to pursue any invasive treatment for his PAD in light of his renal insufficiency. At current, they're not considering his claudication to be lifestyle limiting. He is continuing to walk as best he can. He is able to do routine ADLs, but his exercise is definitely limited by claudication.

## 2015-09-06 ENCOUNTER — Other Ambulatory Visit: Payer: Self-pay | Admitting: Cardiology

## 2015-09-06 NOTE — Telephone Encounter (Signed)
REFILL 

## 2015-10-03 DIAGNOSIS — I10 Essential (primary) hypertension: Secondary | ICD-10-CM | POA: Diagnosis not present

## 2015-10-03 DIAGNOSIS — N183 Chronic kidney disease, stage 3 (moderate): Secondary | ICD-10-CM | POA: Diagnosis not present

## 2015-10-03 DIAGNOSIS — Z6831 Body mass index (BMI) 31.0-31.9, adult: Secondary | ICD-10-CM | POA: Diagnosis not present

## 2015-10-03 DIAGNOSIS — D509 Iron deficiency anemia, unspecified: Secondary | ICD-10-CM | POA: Diagnosis not present

## 2015-10-03 DIAGNOSIS — I6529 Occlusion and stenosis of unspecified carotid artery: Secondary | ICD-10-CM | POA: Diagnosis not present

## 2015-10-03 DIAGNOSIS — I739 Peripheral vascular disease, unspecified: Secondary | ICD-10-CM | POA: Diagnosis not present

## 2015-10-03 DIAGNOSIS — I2510 Atherosclerotic heart disease of native coronary artery without angina pectoris: Secondary | ICD-10-CM | POA: Diagnosis not present

## 2015-10-03 DIAGNOSIS — E113599 Type 2 diabetes mellitus with proliferative diabetic retinopathy without macular edema, unspecified eye: Secondary | ICD-10-CM | POA: Diagnosis not present

## 2015-10-03 DIAGNOSIS — Z1389 Encounter for screening for other disorder: Secondary | ICD-10-CM | POA: Diagnosis not present

## 2015-10-03 DIAGNOSIS — E1142 Type 2 diabetes mellitus with diabetic polyneuropathy: Secondary | ICD-10-CM | POA: Diagnosis not present

## 2015-10-03 DIAGNOSIS — E784 Other hyperlipidemia: Secondary | ICD-10-CM | POA: Diagnosis not present

## 2015-10-03 DIAGNOSIS — E1139 Type 2 diabetes mellitus with other diabetic ophthalmic complication: Secondary | ICD-10-CM | POA: Diagnosis not present

## 2015-10-04 ENCOUNTER — Other Ambulatory Visit: Payer: Self-pay | Admitting: Physician Assistant

## 2015-10-04 NOTE — Telephone Encounter (Signed)
Rx(s) sent to pharmacy electronically.  

## 2015-10-14 ENCOUNTER — Other Ambulatory Visit: Payer: Self-pay | Admitting: Physician Assistant

## 2015-10-15 NOTE — Telephone Encounter (Signed)
Rx(s) sent to pharmacy electronically.  

## 2015-10-22 ENCOUNTER — Other Ambulatory Visit: Payer: Self-pay | Admitting: Physician Assistant

## 2015-10-22 ENCOUNTER — Other Ambulatory Visit: Payer: Self-pay | Admitting: Cardiology

## 2015-10-22 NOTE — Telephone Encounter (Signed)
Rx request sent to pharmacy.  

## 2015-10-23 ENCOUNTER — Telehealth: Payer: Self-pay | Admitting: Cardiology

## 2015-10-23 DIAGNOSIS — H9193 Unspecified hearing loss, bilateral: Secondary | ICD-10-CM | POA: Diagnosis not present

## 2015-10-23 DIAGNOSIS — H6123 Impacted cerumen, bilateral: Secondary | ICD-10-CM | POA: Diagnosis not present

## 2015-10-23 DIAGNOSIS — Z6831 Body mass index (BMI) 31.0-31.9, adult: Secondary | ICD-10-CM | POA: Diagnosis not present

## 2015-10-23 DIAGNOSIS — R05 Cough: Secondary | ICD-10-CM | POA: Diagnosis not present

## 2015-10-23 MED ORDER — CLOPIDOGREL BISULFATE 75 MG PO TABS: 75.0000 mg | ORAL_TABLET | Freq: Every day | ORAL | Status: DC

## 2015-10-23 NOTE — Telephone Encounter (Signed)
New message       *STAT* If patient is at the pharmacy, call can be transferred to refill team.   1. Which medications need to be refilled? (please list name of each medication and dose if known) generic plavix 2. Which pharmacy/location (including street and city if local pharmacy) is medication to be sent to? [facility] 3. Do they need a 30 day or 90 day supply? 90 day

## 2015-10-23 NOTE — Telephone Encounter (Signed)
E SENT TO PHARMACY  -LEFT MESSAGE- ON VOICEMAIL MEDICATION SENT

## 2015-11-08 DIAGNOSIS — M25572 Pain in left ankle and joints of left foot: Secondary | ICD-10-CM | POA: Diagnosis not present

## 2015-11-21 DIAGNOSIS — L570 Actinic keratosis: Secondary | ICD-10-CM | POA: Diagnosis not present

## 2015-11-27 DIAGNOSIS — I2510 Atherosclerotic heart disease of native coronary artery without angina pectoris: Secondary | ICD-10-CM | POA: Diagnosis not present

## 2015-11-27 DIAGNOSIS — I10 Essential (primary) hypertension: Secondary | ICD-10-CM | POA: Diagnosis not present

## 2015-11-27 DIAGNOSIS — N183 Chronic kidney disease, stage 3 (moderate): Secondary | ICD-10-CM | POA: Diagnosis not present

## 2015-11-27 DIAGNOSIS — I739 Peripheral vascular disease, unspecified: Secondary | ICD-10-CM | POA: Diagnosis not present

## 2015-11-27 DIAGNOSIS — E119 Type 2 diabetes mellitus without complications: Secondary | ICD-10-CM | POA: Diagnosis not present

## 2015-12-11 DIAGNOSIS — E1139 Type 2 diabetes mellitus with other diabetic ophthalmic complication: Secondary | ICD-10-CM | POA: Diagnosis not present

## 2015-12-11 DIAGNOSIS — H3563 Retinal hemorrhage, bilateral: Secondary | ICD-10-CM | POA: Diagnosis not present

## 2015-12-11 DIAGNOSIS — E113553 Type 2 diabetes mellitus with stable proliferative diabetic retinopathy, bilateral: Secondary | ICD-10-CM | POA: Diagnosis not present

## 2015-12-26 ENCOUNTER — Other Ambulatory Visit: Payer: Self-pay | Admitting: Cardiology

## 2015-12-28 ENCOUNTER — Encounter: Payer: Self-pay | Admitting: Vascular Surgery

## 2016-01-03 ENCOUNTER — Ambulatory Visit (INDEPENDENT_AMBULATORY_CARE_PROVIDER_SITE_OTHER)
Admission: RE | Admit: 2016-01-03 | Discharge: 2016-01-03 | Disposition: A | Discharge status: Home / Self Care | Payer: Medicare Other | Source: Ambulatory Visit | Attending: Vascular Surgery | Admitting: Vascular Surgery

## 2016-01-03 ENCOUNTER — Encounter: Payer: Self-pay | Admitting: Vascular Surgery

## 2016-01-03 ENCOUNTER — Ambulatory Visit (HOSPITAL_COMMUNITY)
Admission: RE | Admit: 2016-01-03 | Discharge: 2016-01-03 | Disposition: A | Discharge status: Home / Self Care | Payer: Medicare Other | Source: Ambulatory Visit | Attending: Vascular Surgery | Admitting: Vascular Surgery

## 2016-01-03 ENCOUNTER — Ambulatory Visit (INDEPENDENT_AMBULATORY_CARE_PROVIDER_SITE_OTHER): Payer: Medicare Other | Admitting: Vascular Surgery

## 2016-01-03 VITALS — BP 133/60 | HR 57 | Temp 98.6°F | Resp 24 | Ht 69.0 in | Wt 210.0 lb

## 2016-01-03 DIAGNOSIS — I739 Peripheral vascular disease, unspecified: Secondary | ICD-10-CM

## 2016-01-03 DIAGNOSIS — I129 Hypertensive chronic kidney disease with stage 1 through stage 4 chronic kidney disease, or unspecified chronic kidney disease: Secondary | ICD-10-CM | POA: Diagnosis not present

## 2016-01-03 DIAGNOSIS — E1122 Type 2 diabetes mellitus with diabetic chronic kidney disease: Secondary | ICD-10-CM | POA: Diagnosis not present

## 2016-01-03 DIAGNOSIS — R0989 Other specified symptoms and signs involving the circulatory and respiratory systems: Secondary | ICD-10-CM | POA: Diagnosis present

## 2016-01-03 DIAGNOSIS — E11319 Type 2 diabetes mellitus with unspecified diabetic retinopathy without macular edema: Secondary | ICD-10-CM | POA: Diagnosis not present

## 2016-01-03 DIAGNOSIS — R938 Abnormal findings on diagnostic imaging of other specified body structures: Secondary | ICD-10-CM | POA: Diagnosis not present

## 2016-01-03 DIAGNOSIS — I209 Angina pectoris, unspecified: Secondary | ICD-10-CM

## 2016-01-03 DIAGNOSIS — I6523 Occlusion and stenosis of bilateral carotid arteries: Secondary | ICD-10-CM

## 2016-01-03 DIAGNOSIS — F419 Anxiety disorder, unspecified: Secondary | ICD-10-CM | POA: Insufficient documentation

## 2016-01-03 DIAGNOSIS — E7800 Pure hypercholesterolemia, unspecified: Secondary | ICD-10-CM | POA: Insufficient documentation

## 2016-01-03 DIAGNOSIS — K219 Gastro-esophageal reflux disease without esophagitis: Secondary | ICD-10-CM | POA: Diagnosis not present

## 2016-01-03 DIAGNOSIS — I70219 Atherosclerosis of native arteries of extremities with intermittent claudication, unspecified extremity: Secondary | ICD-10-CM

## 2016-01-03 DIAGNOSIS — I2510 Atherosclerotic heart disease of native coronary artery without angina pectoris: Secondary | ICD-10-CM | POA: Insufficient documentation

## 2016-01-03 DIAGNOSIS — N183 Chronic kidney disease, stage 3 (moderate): Secondary | ICD-10-CM | POA: Diagnosis not present

## 2016-01-03 NOTE — Progress Notes (Signed)
[facility] HISTORY AND PHYSICAL    History of Present Illness:  Patient is a 70 y.o. year old male who presents for evaluation of asymptomatic carotid stenosis in bilateral lower extremity claudication.  His carotid stenosis was originally detected on workup for coronary bypass grafting. He denies any symptoms of TIA amaurosis or stroke. He also has bilateral lower extremity claudication. He is currently walking 30 minutes daily in the morning to try to improve his walking distance.  He experiences claudication after walking on a treadmill at 2 miles per hour for 9 minutes. He rests for about 30 seconds and is able to get up to a total time of 35 minutes. 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 He is able to walk about 1 block prior to experiencing symptoms. He denies rest pain. He does have some numbness and tingling in his feet secondary to neuropathy. Other medical problems include hypertension, diabetes, coronary disease, elevated cholesterol, migraine. These are all currently stable. He also has an element of renal dysfunction and has seen Dr. Moshe Cipro. He states his baseline creatinine is around 2. He also has some diabetic retinopathy. He is on Plavix and aspirin and statin. He takes his aspirin every other day to try to reduce bleeding episodes to his eye. He also complains of intermittent swelling in both legs left greater than right this is somewhat improved by mild compression stockings. He also recently had an adjustment of his Lasix dose.     Past Medical History    Diagnosis   Date    .   CAD S/P percutaneous coronary angioplasty   06/12/2011; 09/2104          a) 2012: Complex PCI mRCA ([facility]) --  2 overlapping Promus element DES stents.; 09/2014:  ost-prox SVG-rPDA - Xience DES 3.0 mm x 38 mm; (3.4  - 3.3 mm)    .   Type IVa MI, peak  Troponin 1.63 - peri-PCI infarction during complex stenting of torutuos RCA.  Likely thromboembolic event with PDA occlusion.   06/12/2011          Post Complex PCI    .   S/P CABG x 3   07/01/2013          a. 07/01/2013 (Cath for Crescendo Unstable Angina) --> [name]: For LM disease; LIMA-LAD, SVG-RCA, SCG-Cx; b. 09/2014 Cath/Staged PCI: patent LIMA->LAD, VG->OM1/RI, VG->RCA 80% (3.0x38 Xience Alpine DES on 10/12/2014).    Marland Kitchen   CAD (coronary artery disease), autologous vein bypass graft   09/2014          10/03/14: Cath for Class III-IV Angina -- 80% ostial-proximal SVG-RPDA (minimal flow down native PDA from native RCA with 50% proximal and distal, patent LIMA-LAD, SVG-OM with retrograde filling of OM 2. --> 10/12/14: Staged PCI of SVG-RPDA - Xience DES  3.0 mm x 38 mm; (3.4  - 3.3 mm)    .   Aortic sclerosis  - without Stenosis    March 2014          Echo: Aortic sclerosis without stenosis. EF 55-60% with normal WM; mild concentric hypertrophy with normal relaxation.    Marland Kitchen   PAD (peripheral artery disease)             with claudication    .   High cholesterol       .  Hypertension       .   GERD (gastroesophageal reflux disease)       .   Anxiety       .   Complication of anesthesia             "I've had name recall recognition problems since my last anesthesia"    .   Heart murmur       .   Childhood asthma       .   DM (diabetes mellitus) type II uncontrolled with eye manifestation   2013          Diabetic retinopathy with retinal hemorrhages since;, recurrent in August 2014 treated with Avastin shots    .   Migraine   ~ 2004          "once"    .   CKD (chronic kidney disease), stage III             "mild; related to diabetes" (10/02/2014)    .   Anemia           Past Surgical History    Procedure   Laterality   Date    .   Tonsillectomy          .   Nm myoview ltd      January '13          Diaphragmatic attenuation versus inferior infarct. No ischemia. Normal EF normal wall motion.    Marland Kitchen    Nm myoview ltd      June 2014          no ischemia    .   Coronary angioplasty with stent placement   Right   September 2012          2 overlapping Promus DES 2.7 mm at 12 mm x2; postdilated to 3 mm    .   Cardiac catheterization      06/28/2013          ostial LM disese, RCA ~40-50%ISR    .   Retinal laser procedure   Bilateral             "more than once"     .   Intraoperative transesophageal echocardiogram   N/A   07/01/2013          Procedure: INTRAOPERATIVE TRANSESOPHAGEAL ECHOCARDIOGRAM;  Surgeon: Grace Isaac, MD;  Location: [facility];  Service: Open Heart Surgery;  Laterality: N/A;    .   Transthoracic echocardiogram      06/29/2013          Focal basal hypertrophy with normal LV size. EF 60-65% no regional WMA. Mild LA dilation    .   Left heart catheterization with coronary angiogram   N/A   06/11/2011          Procedure: LEFT HEART CATHETERIZATION WITH CORONARY ANGIOGRAM;  Surgeon: Fulton Reek, MD;  Location: [facility] CATH LAB;  Service: Cardiovascular;  Laterality: N/A;    .   Percutaneous coronary stent intervention (pci-s)      06/11/2011          Procedure: PERCUTANEOUS CORONARY STENT INTERVENTION (PCI-S);  Surgeon: Fulton Reek, MD;  Location[facility] CATH LAB;  Service: Cardiovascular;;    .   Left heart catheterization with coronary angiogram   N/A   06/28/2013          Procedure: LEFT HEART CATHETERIZATION WITH CORONARY ANGIOGRAM;  Surgeon: Leonie Man, MD;  Location[facility]  CATH LAB;  Service: Cardiovascular;  Laterality: N/A;    .   Coronary artery bypass graft   N/A   07/01/2013          Procedure: CORONARY ARTERY BYPASS GRAFTING (CABG);  Surgeon: Grace Isaac, MD;  Location: [facility];  Service: Open Heart Surgery;  Laterality: N/A;  Times 3 using left internal mammary artery and endoscopically harvested bilateral saphenous vein    .   Left and right heart catheterization with coronary/graft angiogram   N/A   10/03/2014          Procedure: LEFT AND RIGHT HEART CATHETERIZATION WITH  Beatrix Fetters;  Surgeon: Leonie Man, MD;  Location: [facility] CATH LAB;  Service: Cardiovascular;  Laterality: N/A;    .   Percutaneous coronary stent intervention (pci-s)   N/A   10/12/2014          Procedure: PERCUTANEOUS CORONARY STENT INTERVENTION (PCI-S);  Surgeon: Leonie Man, MD;  Location: [facility] CATH LAB;  Ostial-Prox SVG-RCA Xience Alpine DES 3.0 mm x 38 mm; (3.4  - 3.3 mm)     History       Social History    .   Marital Status:   Married          Spouse Name:   N/A    .   Number of Children:   N/A    .   Years of Education:   N/A       Occupational History    .   Not on file.       Social History Main Topics    .   Smoking status:   Former Smoker -- 2.00 packs/day for 27 years          Types:   Cigarettes          Quit date:   11/23/1996    .   Smokeless tobacco:   Never Used    .   Alcohol Use:   0.0 oz/week             Comment: 10/02/2014  "some weeks I might have 2-3 drinks then go for a month and not have any"    .   Drug Use:   No    .   Sexual Activity:   Not Currently       Other Topics   Concern    .   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 He notably has not been exercising like he used to. He [place] He has social alcoholic beverages but is trying to cut that back and does not smoke.               He tells me he is a sucker for chips but does not eat sweets because of diabetes.      Family History Family History     Problem    Relation    Age of Onset     .    Cancer    Sister                 Lung cancer     .    Cancer    Mother         .    Cancer  Father         .    Hyperlipidemia    Father         .    Hypertension    Father         .    Cancer    Maternal Grandmother         .    Cancer    Paternal Grandfather           Allergies    Allergies     Allergen    Reactions     .     Atorvastatin    Other (See Comments)             Leg pain     .    Ibuprofen    Hives     .    Pravastatin         .    Simvastatin    Other (See Comments)             Leg pain      Current Outpatient Prescriptions on File Prior to Visit    Medication   Sig   Dispense   Refill    .   acetaminophen (TYLENOL) 325 MG tablet   Take 650 mg by mouth every 4 (four) hours as needed for mild pain.           Marland Kitchen   amLODipine (NORVASC) 10 MG tablet   TAKE 1 TABLET ONCE DAILY.   30 tablet   9    .   aspirin EC 81 MG tablet   Take 1 tablet (81 mg total) by mouth every other day.          .   B-D ULTRAFINE III SHORT PEN 31G X 8 MM MISC   See admin instructions.      0    .   carvedilol (COREG) 12.5 MG tablet   Take 1 tablet (12.5 mg total) by mouth 2 (two) times daily.   60 tablet   11    .   clopidogrel (PLAVIX) 75 MG tablet   Take 1 tablet (75 mg total) by mouth daily with breakfast.   30 tablet   11    .   CRESTOR 10 MG tablet   TAKE ONE TABLET EVERY OTHER DAY.   15 tablet   11    .   fenofibrate 160 MG tablet   Take 160 mg by mouth daily.          .   furosemide (LASIX) 40 MG tablet   Take 1 tablet (40 mg total) by mouth daily. (Patient taking differently: Take 40 mg by mouth 2 (two) times daily. )   30 tablet   11    .   HUMALOG KWIKPEN 100 UNIT/ML SOPN   Inject 10-15 Units into the skin 3 (three) times daily. Sliding scale. 10 units with breakfast and 15 units with lunch and supper          .   hydrALAZINE (APRESOLINE) 100 MG tablet   Take 1 tablet (100 mg total) by mouth 2 (two) times daily.   60 tablet   11    .   insulin glargine (LANTUS) 100 UNIT/ML injection   Inject 40-70 Units into the skin 2 (two) times daily. 40 units in am, 70 units in pm          .   isosorbide mononitrate (IMDUR) 60 MG 24  hr tablet   Take 1 tablet (60 mg total) by mouth daily.   30 tablet   11    .   NITROSTAT 0.4 MG SL tablet   DISSOLVE 1 TABLET UNDER TONGUE AS NEEDED FOR CHEST PAIN,MAY REPEAT IN5 MINUTES FOR 2 DOSES.   25  tablet   1    .   pantoprazole (PROTONIX) 40 MG tablet   Take 40 mg by mouth daily.             Current Facility-Administered Medications on File Prior to Visit    Medication   Dose   Route   Frequency   Provider   Last Rate   Last Dose    .   0.9 %  sodium chloride infusion       Intravenous   Continuous   Leonie Man, MD              ROS:     Cardiac: He denies chest pain.  Respiratory: He denies shortness of breath except with severe exertion  Urinary: [x ] chronic Kidney disease, [ ]  on HD - [ ]  MWF or [ ]  TTHS, [ ]  Burning with urination, [ ]  Frequent urination, [ ]  Difficulty urinating;      Physical Examination     Filed Vitals:   01/03/16 1415  BP: 133/60  Pulse: 57  Temp: 98.6 F (37 C)  TempSrc: Oral  Resp: 24  Height: 5\' 9"  (1.753 m)  Weight: 210 lb (95.255 kg)  SpO2: 94%    General:  Alert and oriented, no acute distress HEENT: Normal Neck: Soft left carotid bruit no right bruit Pulmonary: Clear to auscultation bilaterally Cardiac: Regular Rate and Rhythm without murmur Skin: No rash, no skin cracks or ulcers on the feet Extremity Pulses:  2+ radial, brachial, femoral pulses bilaterally absent popliteal and pedal pulses bilaterally Musculoskeletal: No deformity trace edema bilaterally left greater than right      Neurologic: Upper and lower extremity motor 5/5 and symmetric, no facial asymmetry  DATA:  Patient had bilateral ABIs performed today which were 0.49 on the right 0.41 on the left monophasic waveforms. He also had a carotid duplex exam which showed a 60-80% left internal carotid artery stenosis peak systolic velocity [id number] and diastolic 99 antegrade vertebral flow bilaterally I see to CCA ratio on the left was 5.6 a 40% right internal carotid artery stenosis which was unchanged .  Previously he also had bilateral lower extremity arterial duplex scan. This showed a patent but narrowed right superficial femoral artery in the proximal and distal  segment. He also had a 50-75% stenosis of the left common femoral artery and a greater than 75% stenosis of the distal left superficial femoral artery I reviewed and interpreted this study.   ASSESSMENT:  #1 claudication lifestyle limiting but reluctant to proceed with arteriogram secondary to renal dysfunction. #2  antiplatelet and statin therapy.  Velocities in the left carotid had increased significantly. Most likely he has a high-grade stenosis on the left side. Lesion was calcified mean velocities may be slightly lower than they actually are. I discussed with the patient today the possibility of left carotid endarterectomy for stroke prophylaxis. Risk of operation cranial nerve injury stroke risk bleeding infection perioperative risks were discussed with the patient. He is reluctant to proceed with operation currently because he has several work-related things going on. He would like to defer further discussions until November of this year. I discussed with him signs  and symptoms of TIA amaurosis or stroke. He will call me sooner if he develops any of these symptoms. Otherwise I will see him in November and have further discussions regarding carotid endarterectomy.   PLAN:  Carotid duplex scan in in November and have further discussions regarding carotid endarterectomy. Repeat ABIs and arterial duplex in 6 months. Continue walking program and risk factor modification.  Ruta Hinds, MD [facility] Office: [phone] Pager: [phone]

## 2016-01-07 IMAGING — CR DG CHEST 2V
2 series · 2 of 2 positions shown · non-contrast
Comparison: June 24, 2013.

CLINICAL DATA: Coronary artery disease.

EXAM:
CHEST  2 VIEW

[w chest pa]
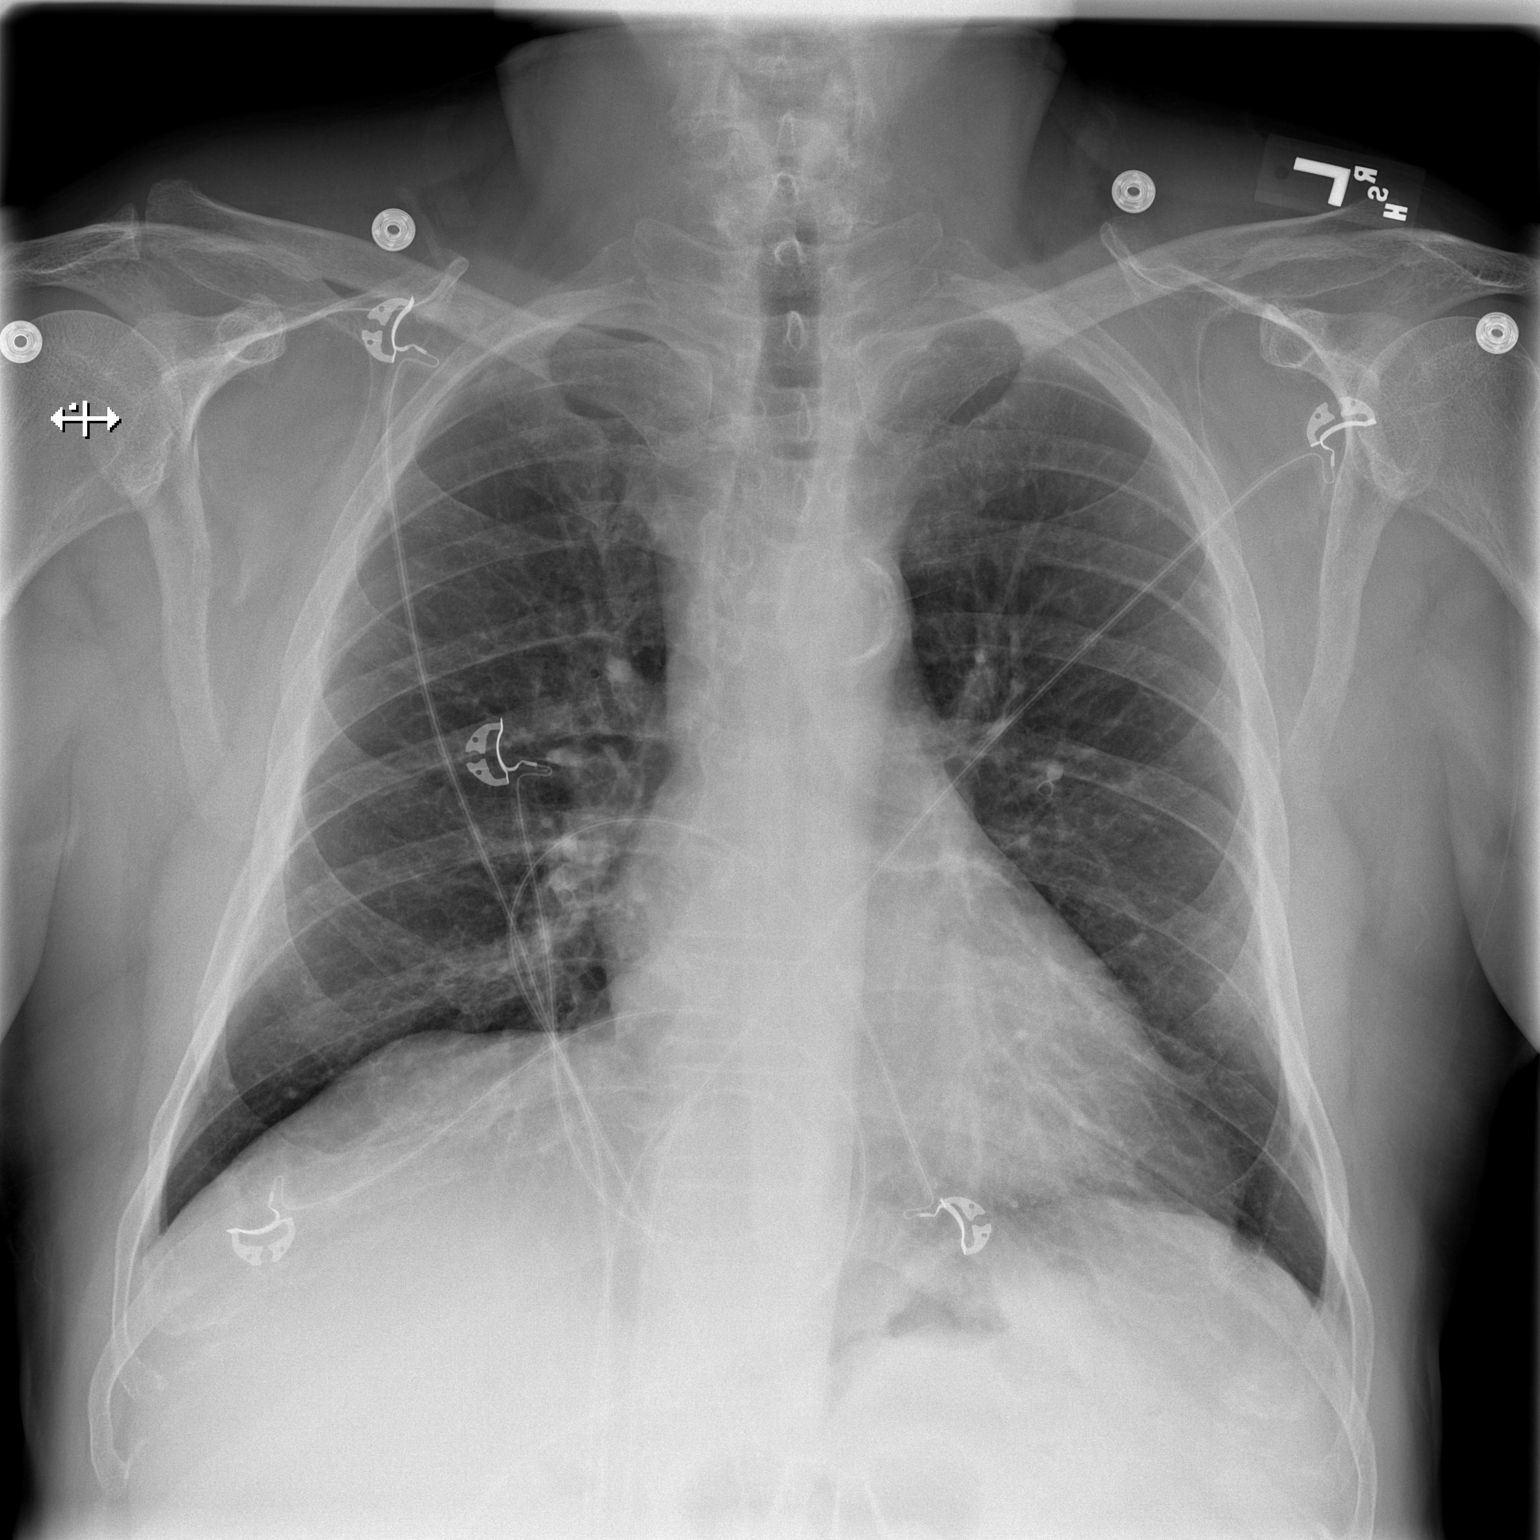

[w chest lat]
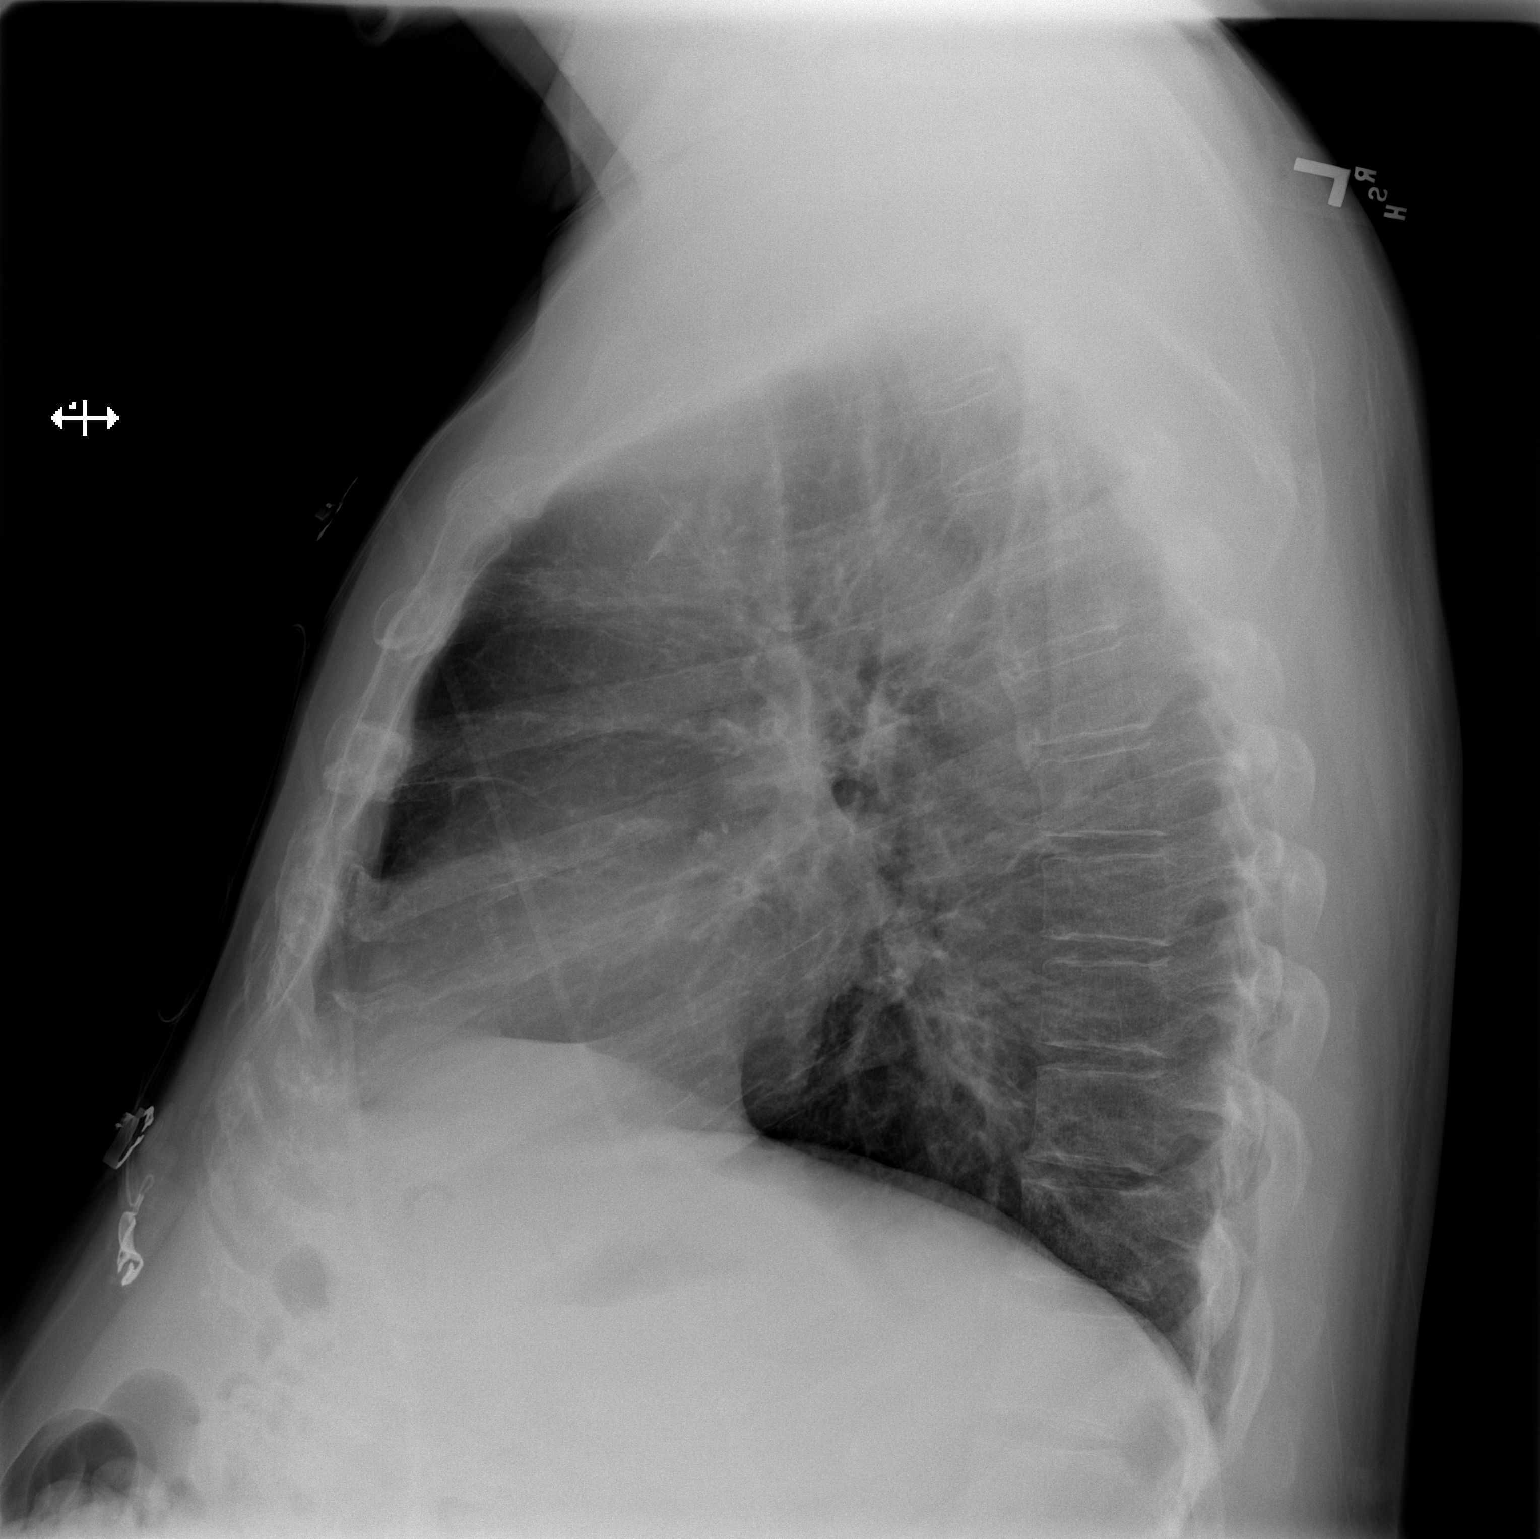

[2 of 2 positions shown; findings below may reference images not displayed]

FINDINGS: The heart size and mediastinal contours are within normal limits.
Both lungs are clear. No pleural effusion or pneumothorax is noted.
The visualized skeletal structures are unremarkable.
IMPRESSION: No active cardiopulmonary disease.

## 2016-01-08 IMAGING — DX DG CHEST 1V PORT
1 series · 1 of 1 positions shown · non-contrast
Comparison: 06/30/2013

CLINICAL DATA: Postop cardiac surgery.

EXAM:
PORTABLE CHEST - 1 VIEW

[portable]
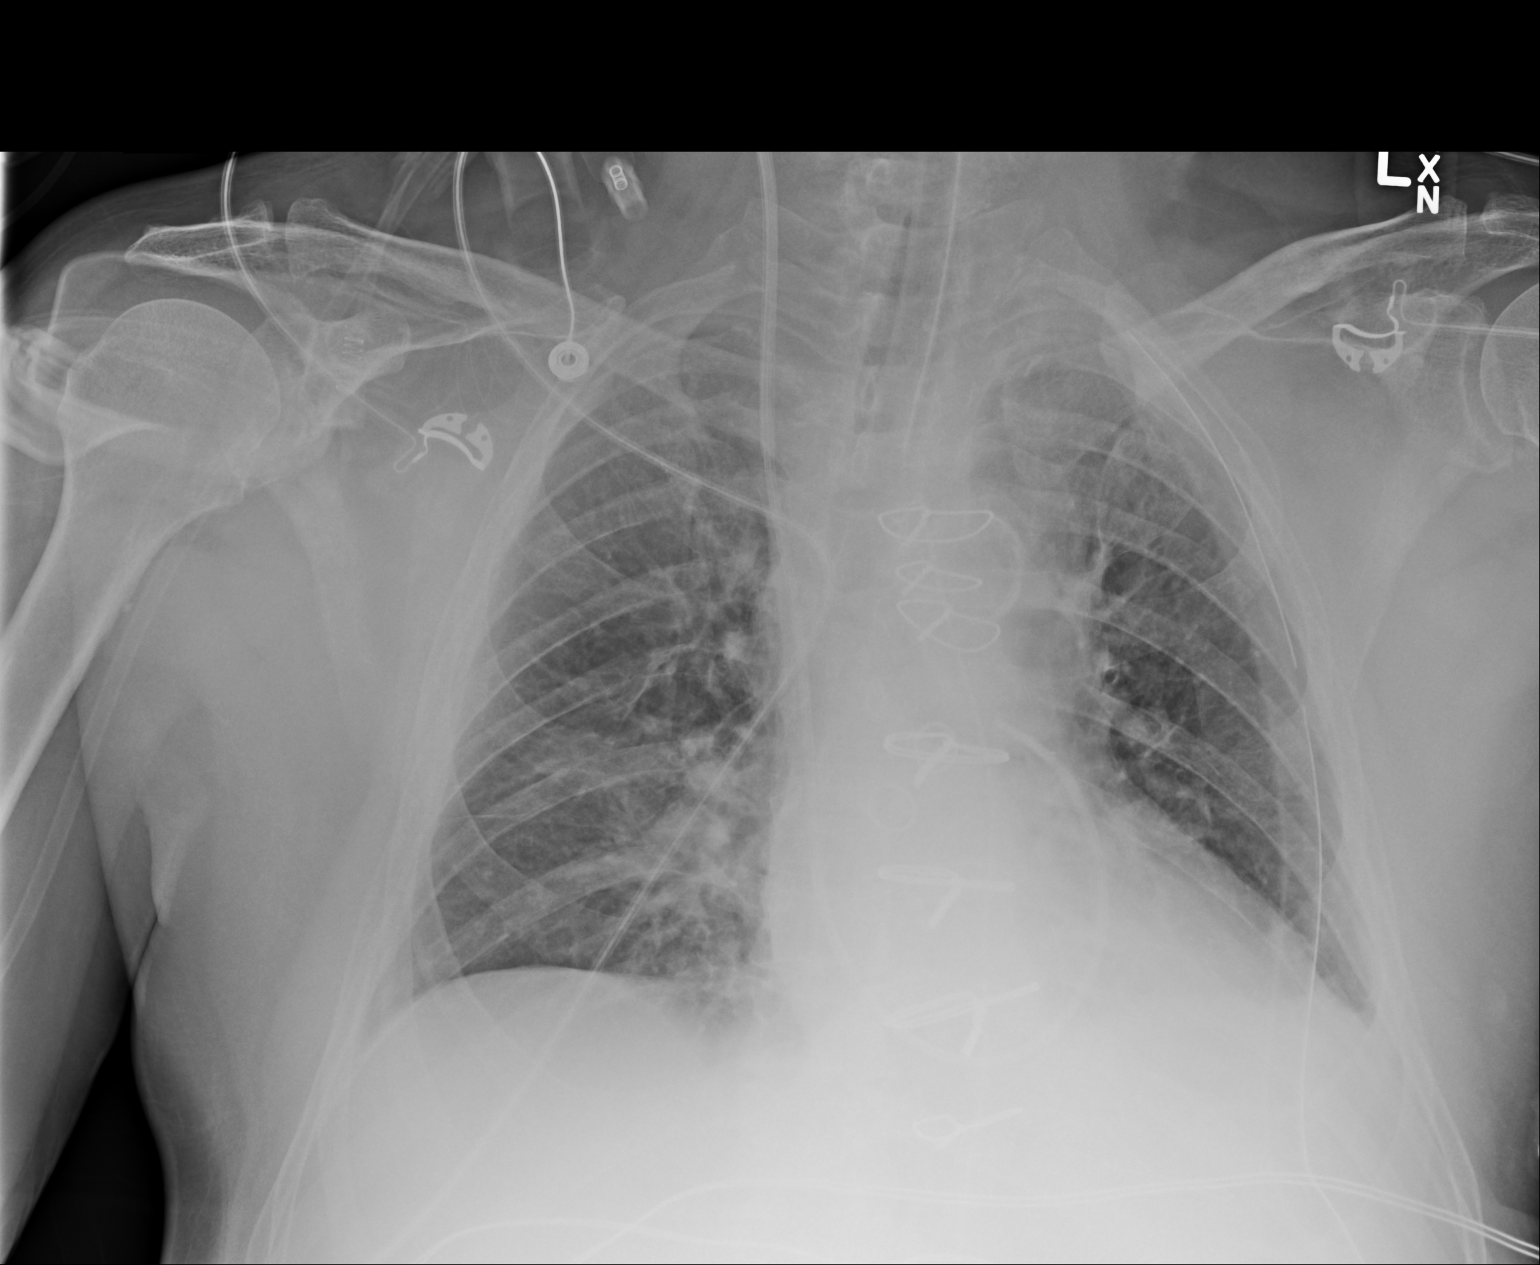

[1 of 1 positions shown; findings below may reference images not displayed]

FINDINGS: Endotracheal tube has tip 5.6 cm above the carina. Right IJ
Swan-Ganz catheter is present with tip overlying the main pulmonary
artery segment. Mediastinal drain is present. Left-sided chest tube
has tip over the left apex. There is no definite left-sided
pneumothorax. There is mild left base opacification suggesting a
small amount of pleural fluid and atelectasis. This very minimal
hazy right midlung opacification suggesting atelectasis.
Cardiomediastinal silhouette and remainder the exam is unchanged.
IMPRESSION: Mild left base opacification suggesting a small amount of pleural
fluid and atelectasis. Subtle hazy density over the right mid lung
suggesting atelectasis.

Tubes and lines as described.

## 2016-01-10 IMAGING — CR DG CHEST 1V PORT
1 series · 1 of 1 positions shown · non-contrast
Comparison: 07/02/2013

CLINICAL DATA: Coronary bypass, postoperative exam

EXAM:
PORTABLE CHEST - 1 VIEW

[AP]
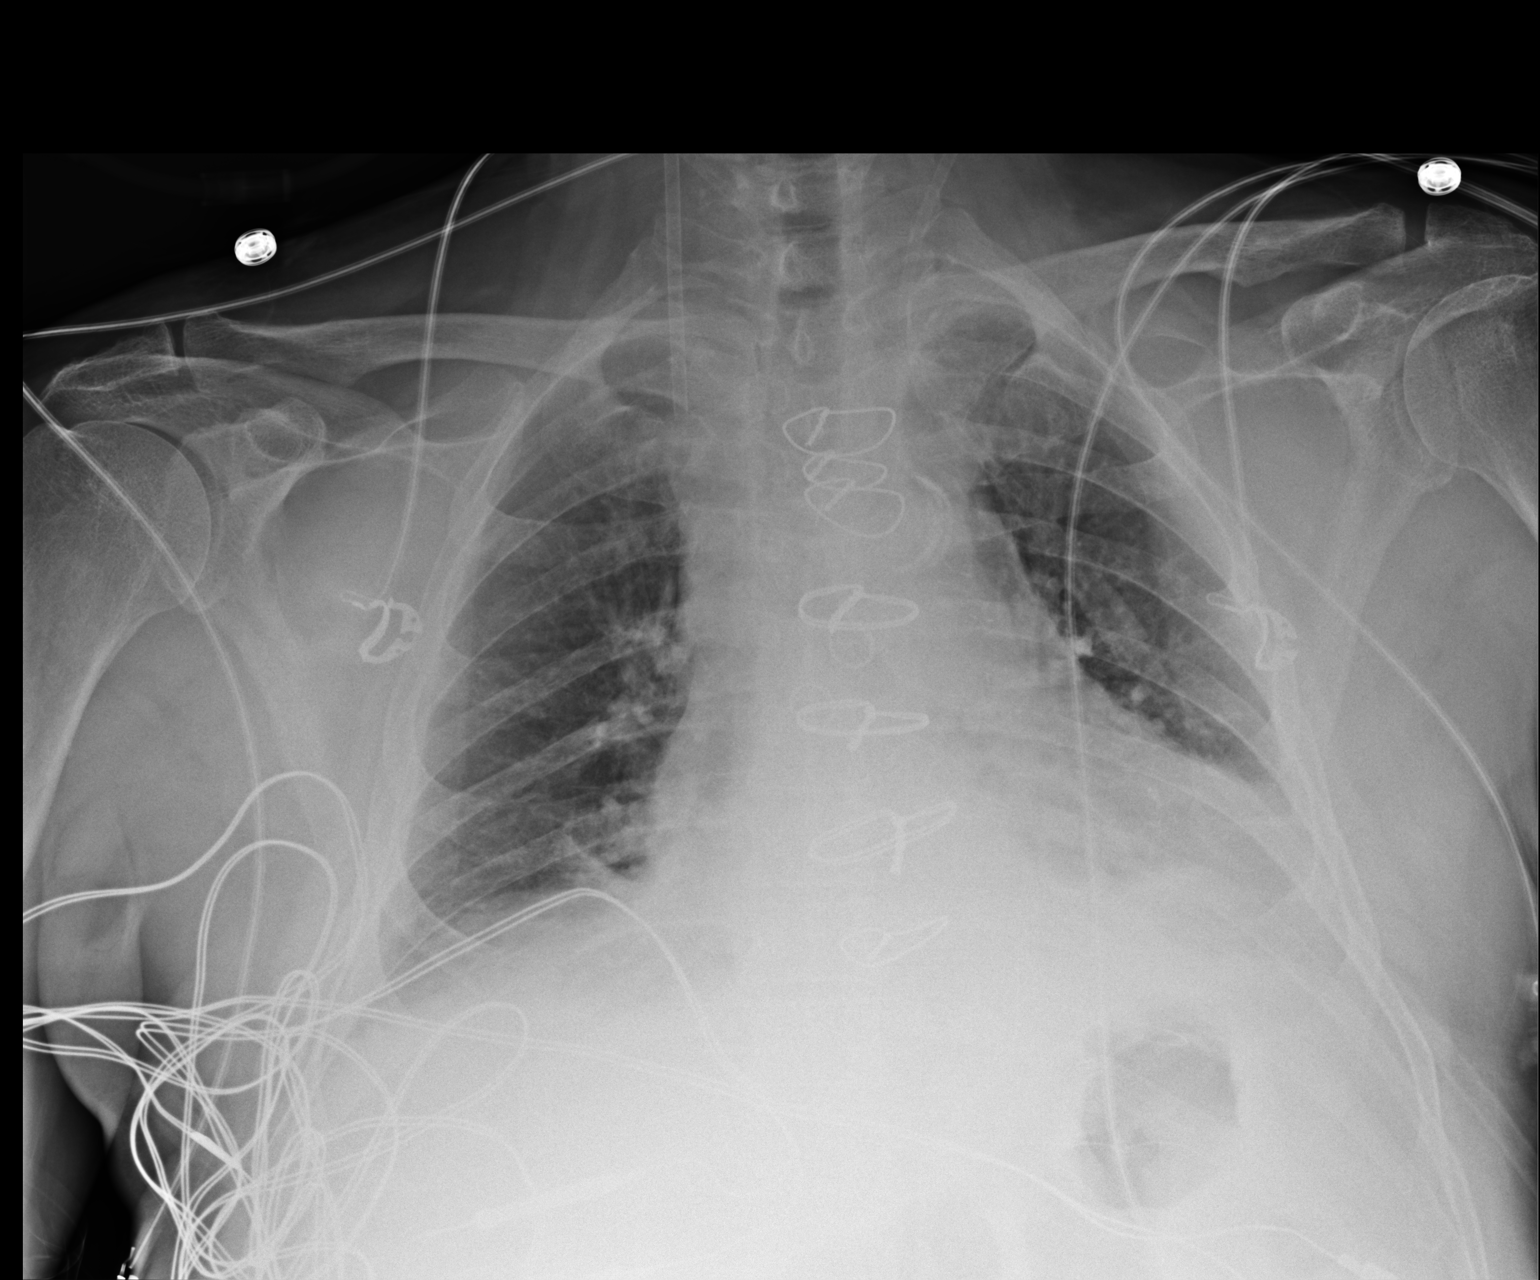

[1 of 1 positions shown; findings below may reference images not displayed]

FINDINGS: Swan-Ganz catheter, mediastinal drain, and left chest tube have all
been removed. Right IJ vascular sheath remains stable in position.
Coronary bypass changes noted. Low lung volumes persist with basilar
atelectasis and small effusions. No pneumothorax. Stable exam.
IMPRESSION: Stable portable chest exam.  No pneumothorax.

## 2016-01-15 DIAGNOSIS — E113593 Type 2 diabetes mellitus with proliferative diabetic retinopathy without macular edema, bilateral: Secondary | ICD-10-CM | POA: Diagnosis not present

## 2016-01-15 DIAGNOSIS — E1139 Type 2 diabetes mellitus with other diabetic ophthalmic complication: Secondary | ICD-10-CM | POA: Diagnosis not present

## 2016-01-15 DIAGNOSIS — E785 Hyperlipidemia, unspecified: Secondary | ICD-10-CM | POA: Diagnosis not present

## 2016-01-15 DIAGNOSIS — N08 Glomerular disorders in diseases classified elsewhere: Secondary | ICD-10-CM | POA: Diagnosis not present

## 2016-01-15 DIAGNOSIS — D6489 Other specified anemias: Secondary | ICD-10-CM | POA: Diagnosis not present

## 2016-01-15 DIAGNOSIS — Z6830 Body mass index (BMI) 30.0-30.9, adult: Secondary | ICD-10-CM | POA: Diagnosis not present

## 2016-01-15 DIAGNOSIS — I6529 Occlusion and stenosis of unspecified carotid artery: Secondary | ICD-10-CM | POA: Diagnosis not present

## 2016-01-15 DIAGNOSIS — E784 Other hyperlipidemia: Secondary | ICD-10-CM | POA: Diagnosis not present

## 2016-01-15 DIAGNOSIS — I10 Essential (primary) hypertension: Secondary | ICD-10-CM | POA: Diagnosis not present

## 2016-01-15 DIAGNOSIS — R946 Abnormal results of thyroid function studies: Secondary | ICD-10-CM | POA: Diagnosis not present

## 2016-01-15 DIAGNOSIS — I7389 Other specified peripheral vascular diseases: Secondary | ICD-10-CM | POA: Diagnosis not present

## 2016-01-15 DIAGNOSIS — D649 Anemia, unspecified: Secondary | ICD-10-CM | POA: Diagnosis not present

## 2016-01-15 DIAGNOSIS — I5032 Chronic diastolic (congestive) heart failure: Secondary | ICD-10-CM | POA: Diagnosis not present

## 2016-01-18 ENCOUNTER — Other Ambulatory Visit: Payer: Self-pay | Admitting: Cardiology

## 2016-01-21 NOTE — Telephone Encounter (Signed)
Rx request sent to pharmacy.  

## 2016-01-23 ENCOUNTER — Other Ambulatory Visit: Payer: Self-pay | Admitting: *Deleted

## 2016-01-23 DIAGNOSIS — I6523 Occlusion and stenosis of bilateral carotid arteries: Secondary | ICD-10-CM

## 2016-03-04 ENCOUNTER — Ambulatory Visit (INDEPENDENT_AMBULATORY_CARE_PROVIDER_SITE_OTHER): Payer: Medicare Other | Admitting: Cardiology

## 2016-03-04 ENCOUNTER — Encounter: Payer: Self-pay | Admitting: Cardiology

## 2016-03-04 VITALS — BP 120/48 | HR 58 | Ht 69.0 in | Wt 211.5 lb

## 2016-03-04 DIAGNOSIS — I2510 Atherosclerotic heart disease of native coronary artery without angina pectoris: Secondary | ICD-10-CM | POA: Diagnosis not present

## 2016-03-04 DIAGNOSIS — R42 Dizziness and giddiness: Secondary | ICD-10-CM

## 2016-03-04 DIAGNOSIS — Z9861 Coronary angioplasty status: Secondary | ICD-10-CM

## 2016-03-04 DIAGNOSIS — I10 Essential (primary) hypertension: Secondary | ICD-10-CM

## 2016-03-04 DIAGNOSIS — I70219 Atherosclerosis of native arteries of extremities with intermittent claudication, unspecified extremity: Secondary | ICD-10-CM

## 2016-03-04 DIAGNOSIS — I209 Angina pectoris, unspecified: Secondary | ICD-10-CM

## 2016-03-04 DIAGNOSIS — I6522 Occlusion and stenosis of left carotid artery: Secondary | ICD-10-CM

## 2016-03-04 DIAGNOSIS — E785 Hyperlipidemia, unspecified: Secondary | ICD-10-CM

## 2016-03-04 DIAGNOSIS — R600 Localized edema: Secondary | ICD-10-CM

## 2016-03-04 DIAGNOSIS — I739 Peripheral vascular disease, unspecified: Secondary | ICD-10-CM

## 2016-03-04 DIAGNOSIS — I25810 Atherosclerosis of coronary artery bypass graft(s) without angina pectoris: Secondary | ICD-10-CM | POA: Diagnosis not present

## 2016-03-04 DIAGNOSIS — I5032 Chronic diastolic (congestive) heart failure: Secondary | ICD-10-CM | POA: Diagnosis not present

## 2016-03-04 NOTE — Patient Instructions (Signed)
Your physician has recommended that you walk with a cane.    Labwork:  We will call to get recent labs from Dr. Forde Dandy.    Follow-Up:  Your physician wants you to follow-up in: 6 months with Dr. Ellyn Hack. You will receive a reminder letter in the mail two months in advance. If you don't receive a letter, please call our office to schedule the follow-up appointment. If you need a refill on your cardiac medications before your next appointment, please call your pharmacy.

## 2016-03-04 NOTE — Progress Notes (Signed)
PCP: Sheela Stack, MD  Clinic Note: Chief Complaint  Patient presents with  . Follow-up    has some edema, has pain and cramping in legs with walking, has lightheaded and dizziness  . Coronary Artery Disease  . PAD    HPI: Lance Collier is a 70 y.o. male with a PMH below who presents today for 6 month f/u of CAD-CABG-PCI & Chronic DHF (HF PEF).   Lance Collier was last seen in March 2017  Recent Hospitalizations: none  Studies Reviewed: n/a  Due for Left Carotid CEA this fall.    Interval History: [name] seems to be doing okay overall from a cardiac standpoint. He still has exercise limiting claudication, and states that is really the claudication that bothers him more than dyspnea or any potential anginal symptoms. He really has not had any anginal symptoms in quite some time now. He says that these may be able to walk half a block before he has to stop because of claudication. Major complaint today is he's been having some dizziness and lightheadedness episodes at least 3-4 times a week. This actually usually happens when he is walking and not at rest. He doesn't notice it when he is driving or sitting down watching TV. Is not associated with when he first stands up. These episodes last up to ~1-2 h, feels like he is swaying - poor balance that he has noticed only in the last couple months.  Cardiac ROS Still some DOE - but mostly limited by claudication. No chest pain or shortness of breath with rest or exertion.  No PND, orthopnea with mild lower extremity edema.  No palpitations, weakness or syncope/near syncope. No TIA/amaurosis fugax symptoms. No melena, hematochezia, hematuria, or epstaxis. Significant limiting claudication. - Is followed by vascular surgery; Dr. Oneida Alar PAD Screen 03/06/2016  Previous PAD dx? Yes  Previous surgical procedure? Yes  Pain with walking? Yes  Subsides with rest? Yes  Feet/toe relief with dangling? No  Painful, non-healing  ulcers? No  Extremities discolored? No    ROS: A comprehensive was performed. Review of Systems  Constitutional: Positive for malaise/fatigue (Probably because he just really can't do much).  HENT: Negative for nosebleeds.   Respiratory: Positive for cough (He's been having coughing episodes intermittently. Over last week. Just getting over a cold). Negative for sputum production, shortness of breath and wheezing.   Cardiovascular: Positive for claudication (may not be "lifestyle limiting as far as the fact that he can do some exercise, but is certainly limiting his activity.Marland Kitchen  He is reluctant to undergo any invasive procedures however because of his renal insufficiency.). Negative for palpitations.  Gastrointestinal: Negative for blood in stool, constipation, heartburn and melena.  Genitourinary: Negative for hematuria.  Musculoskeletal: Positive for back pain and joint pain (knees & ankles). Negative for falls and myalgias.  Neurological: Positive for dizziness (Significant dizziness with balance issues noted in history of present illness.) and tingling (He does have some low 70 peripheral neuropathy). Negative for headaches.       He is due to have carotid endarterectomy this fall.  Psychiatric/Behavioral: Negative for depression and memory loss. The patient has insomnia. The patient is not nervous/anxious.   All other systems reviewed and are negative.     Past Medical History:  Diagnosis Date  . Anemia   . Anxiety   . Aortic sclerosis  - without Stenosis  March 2014   Echo: Aortic sclerosis without stenosis. EF 55-60% with normal WM; mild concentric  hypertrophy with normal relaxation.  Marland Kitchen CAD (coronary artery disease), autologous vein bypass graft 09/2014   10/03/14: Cath for Class III-IV Angina -- 80% ostial-proximal SVG-RPDA (minimal flow down native PDA from native RCA with 50% proximal and distal, patent LIMA-LAD, SVG-OM with retrograde filling of OM 2. --> 10/12/14: Staged PCI of  SVG-RPDA - Xience DES  3.0 mm x 38 mm; (3.4  - 3.3 mm)  . CAD S/P percutaneous coronary angioplasty 06/12/2011; 09/2104   a) 2012: Complex PCI mRCA ([place]) --  2 overlapping Promus element DES stents.; 09/2014:  ost-prox SVG-rPDA - Xience DES 3.0 mm x 38 mm; (3.4  - 3.3 mm)  . Childhood asthma   . CKD (chronic kidney disease), stage III    "mild; related to diabetes" (10/02/2014)  . Complication of anesthesia    "I've had name recall recognition problems since my last anesthesia"  . DM (diabetes mellitus) type II uncontrolled with eye manifestation ([place]) 2013   Diabetic retinopathy with retinal hemorrhages since;, recurrent in August 2014 treated with Avastin shots  . GERD (gastroesophageal reflux disease)   . Heart murmur   . High cholesterol   . Hypertension   . Migraine ~ 2004   "once"  . PAD (peripheral artery disease) (HCC)    with claudication  . 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 07/01/2013 (Cath for Crescendo Unstable Angina) --> [name]: For LM disease; LIMA-LAD, SVG-RCA, SCG-Cx; b. 09/2014 Cath/Staged PCI: patent LIMA->LAD, VG->OM1/RI, VG->RCA 80% (3.0x38 Xience Alpine DES on 10/12/2014).  . 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  Likely thromboembolic event with PDA occlusion. 06/12/2011   Post Complex PCI    Past Surgical History:  Procedure Laterality Date  . CARDIAC CATHETERIZATION  06/28/2013   ostial LM disese, RCA ~40-50%ISR  . CORONARY ANGIOPLASTY WITH STENT PLACEMENT Right September 2012   2 overlapping Promus DES 2.7 mm at 12 mm x2; postdilated to 3 mm  . CORONARY ARTERY BYPASS GRAFT N/A 07/01/2013   Procedure: CORONARY ARTERY BYPASS GRAFTING (CABG);  Surgeon: Grace Isaac, MD;  Location: [place];  Service: Open Heart Surgery;  Laterality: N/A;  Times 3 using left internal mammary artery and endoscopically harvested bilateral saphenous vein  . INTRAOPERATIVE TRANSESOPHAGEAL ECHOCARDIOGRAM N/A 07/01/2013   Procedure:  INTRAOPERATIVE TRANSESOPHAGEAL ECHOCARDIOGRAM;  Surgeon: Grace Isaac, MD;  Location: [place];  Service: Open Heart Surgery;  Laterality: N/A;  . LEFT AND RIGHT HEART CATHETERIZATION WITH CORONARY/GRAFT ANGIOGRAM N/A 10/03/2014   Procedure: LEFT AND RIGHT HEART CATHETERIZATION WITH Beatrix Fetters;  Surgeon: Leonie Man, MD;  Location: [facility] CATH LAB;  Service: Cardiovascular;  Laterality: N/A;  . LEFT HEART CATHETERIZATION WITH CORONARY ANGIOGRAM N/A 06/11/2011   Procedure: LEFT HEART CATHETERIZATION WITH CORONARY ANGIOGRAM;  Surgeon: Fulton Reek, MD;  Location: [facility] CATH LAB;  Service: Cardiovascular;  Laterality: N/A;  . LEFT HEART CATHETERIZATION WITH CORONARY ANGIOGRAM N/A 06/28/2013   Procedure: LEFT HEART CATHETERIZATION WITH CORONARY ANGIOGRAM;  Surgeon: Leonie Man, MD;  Location: [facility] CATH LAB;  Service: Cardiovascular;  Laterality: N/A;  . NM MYOVIEW LTD  January '13   Diaphragmatic attenuation versus inferior infarct. No ischemia. Normal EF normal wall motion.  Marland Kitchen NM MYOVIEW LTD  June 2014   no ischemia  . NM MYOVIEW LTD  10/2014   LEXISCAN: Normal EF, 61%, No ischemia or infarction.  Mild diaphragmatic attenuation  . PERCUTANEOUS CORONARY STENT INTERVENTION (PCI-S)  06/11/2011   Procedure: PERCUTANEOUS CORONARY  STENT INTERVENTION (PCI-S);  Surgeon: Fulton Reek, MD;  Location: [facility] CATH LAB;  Service: Cardiovascular;;  . PERCUTANEOUS CORONARY STENT INTERVENTION (PCI-S) N/A 10/12/2014   Procedure: PERCUTANEOUS CORONARY STENT INTERVENTION (PCI-S);  Surgeon: Leonie Man, MD;  Location: [facility] CATH LAB;  Ostial-Prox SVG-RCA Xience Alpine DES 3.0 mm x 38 mm; (3.4  - 3.3 mm)  . RETINAL LASER PROCEDURE Bilateral    "more than once"   . TONSILLECTOMY    . TRANSTHORACIC ECHOCARDIOGRAM  06/29/2013   Focal basal hypertrophy with normal LV size. EF 60-65% no regional WMA. Mild LA dilation  . TRANSTHORACIC ECHOCARDIOGRAM  April 2016   Mod focal basal & mild overall LVH. EF  55-60%. Mild Ao Sclerosis, Mild MR with MAC. Only minimally elevated RVP.    Prior to Admission medications   Medication Sig Start Date Taking? Authorizing Provider  acetaminophen (TYLENOL) 325 MG tablet Take 650 mg by mouth every 4 (four) hours as needed for mild pain. Reported on 01/03/2016  Yes Historical Provider, MD  amLODipine (NORVASC) 10 MG tablet TAKE 1 TABLET ONCE DAILY. 01/21/16 Yes Leonie Man, MD  aspirin EC 81 MG tablet Take 1 tablet (81 mg total) by mouth every other day. 11/06/14 Yes Leonie Man, MD  B-D ULTRAFINE III SHORT PEN 31G X 8 MM MISC See admin instructions. 04/10/14 Yes Historical Provider, MD  carvedilol (COREG) 12.5 MG tablet TAKE 1 TABLET TWICE DAILY. 09/06/15 Yes Leonie Man, MD  clopidogrel (PLAVIX) 75 MG tablet Take 1 tablet (75 mg total) by mouth daily with breakfast. 10/23/15 Yes Leonie Man, MD  fenofibrate 160 MG tablet Take 160 mg by mouth daily. 10/31/12 Yes Historical Provider, MD  furosemide (LASIX) 40 MG tablet Take 40 mg by mouth. Take 2 tabs in the morning and 1 tab in the evening  Yes Historical Provider, MD  HUMALOG KWIKPEN 100 UNIT/ML SOPN Inject 10-15 Units into the skin 3 (three) times daily. Sliding scale. 10 units with breakfast and 15 units with lunch and supper 10/31/12 Yes Historical Provider, MD  hydrALAZINE (APRESOLINE) 100 MG tablet TAKE 1 TABLET TWICE DAILY. 10/15/15 Yes Leonie Man, MD  insulin glargine (LANTUS) 100 UNIT/ML injection Inject 40-75 Units into the skin 2 (two) times daily. 40 units in am, 75 units in pm  Yes Historical Provider, MD  isosorbide mononitrate (IMDUR) 60 MG 24 hr tablet Take 1 tablet (60 mg total) by mouth daily. 10/04/15 Yes Leonie Man, MD  NITROSTAT 0.4 MG SL tablet DISSOLVE 1 TABLET UNDER TONGUE AS NEEDED FOR CHEST PAIN,MAY REPEAT IN5 MINUTES FOR 2 DOSES. 11/01/14 Yes Leonie Man, MD  pantoprazole (PROTONIX) 40 MG tablet Take 40 mg by mouth daily.  Yes Historical Provider, MD  rosuvastatin  (CRESTOR) 10 MG tablet TAKE ONE TABLET EVERY OTHER DAY. 12/26/15 Yes Leonie Man, MD  topiramate (TOPAMAX) 25 MG tablet Take 1 tablet by mouth 2 (two) times daily. 01/18/16 Yes Historical Provider, MD  VASCEPA 1 G CAPS Take 2 g by mouth 2 (two) times daily. Take 2 capsules twice each day. 01/31/15 Yes Historical Provider, MD    Allergies  Allergen Reactions  . Atorvastatin Other (See Comments)    Leg pain  . Ibuprofen Hives  . Pravastatin   . Simvastatin Other (See Comments)    Leg pain    Social History   Social History  . Marital status: Married    Spouse name: N/A  . Number of children: N/A  . Years of  education: N/A   Social History Main Topics  . Smoking status: Former Smoker    Packs/day: 2.00    Years: 27.00    Types: Cigarettes    Quit date: 11/23/1996  . Smokeless tobacco: Never Used  . Alcohol use 0.0 oz/week     Comment: 10/02/2014  "some weeks I might have 2-3 drinks then go for a month and not have any"  . Drug use: No  . Sexual activity: Not Currently   Other Topics Concern  . 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 He notably has not been exercising like he used to. He [place] walk [place] and [place]. He has social alcoholic beverages but is trying to cut that back and does not smoke.       He tells me he is a sucker for chips but does not eat sweets because of diabetes.    Family History  Problem Relation Age of Onset  . Cancer Sister     Lung cancer  . Cancer Mother   . Cancer Father   . Hyperlipidemia Father   . Hypertension Father   . Cancer Maternal Grandmother   . Cancer Paternal Grandfather     Wt Readings from Last 3 Encounters:  03/04/16 211 lb 8 oz (95.9 kg)  01/03/16 210 lb (95.3 kg)  08/22/15 210 lb (95.3 kg)    PHYSICAL EXAM BP (!) 120/48 (BP Location: Left Arm, Patient Position: Sitting, Cuff Size: Normal)   Pulse (!)  58   Ht 5\' 9"  (1.753 m)   Wt 211 lb 8 oz (95.9 kg)   BMI 31.23 kg/m  General appearance: alert, cooperative, appears stated age, no distress and Well-nourished, and well groomed. HEENT: [place]/AT, EOMI, MMM, anicteric sclera Neck: no adenopathy, no carotid bruit, mild JVD (with HJR) and supple, symmetrical, trachea midline Lungs: CTA B., normal percussion bilaterally and Nonlabored, good air movement - no obvious rales Heart: RRR, S1 level and split S2, soft SEM @ RUSB & soft HSM.Marland Kitchen No click, rub or gallop, normal apical impulse. Well-healed scar. Abdomen: soft, non-tender; bowel sounds normal; no masses, no organomegaly and Rotund abdomen Extremities: 1-2++ bilaterally. and no ulcers, gangrene or trophic changes Pulses: mildly diminished pedal pulses bilaterally but 2+ bounding radial pulses bilaterally Neurologic: Grossly normal; cranial nerves grossly intact; slow, deliberate gait    Adult ECG Report  Rate: 59 ;  Rhythm: sinus bradycardia, premature ventricular contractions (PVC) and Borderline 1 AVB (PR interval 200). Voltage criteria for LVH, nonspecific ST-T wave changes.; otherwise normal axis, intervals and durations.  Narrative Interpretation: Rate is slower with 1 AVB and PVCs on current EKG. Otherwise no change.   Other studies Reviewed: Additional studies/ records that were reviewed today include:  Recent Labs:  By PCP - not available.   ASSESSMENT / PLAN: Problem List Items Addressed This Visit    PAD (peripheral artery disease) both Rt and Lt disease with Lt ICA of 60-79% (Chronic)   Relevant Medications   furosemide (LASIX) 40 MG tablet   Other Relevant Orders   EKG 12-Lead (Completed)   Hyperlipidemia with target LDL less than 70; intolerance to atorvastatin and simvastatin (Chronic)    He is on Crestor that he is taking every other day along with fenofibrate. He should have his labs checked soon. Would like to see the results to ensure that we are adequately  controlling  his levels. May want to consider the possibility of referring to lipid clinic for PCSK9 inhibitor..      Relevant Medications   furosemide (LASIX) 40 MG tablet   Essential hypertension (Chronic)    Well-controlled on current medications including carvedilol, Norvasc and hydralazine/Imdur.      Relevant Medications   furosemide (LASIX) 40 MG tablet   Other Relevant Orders   EKG 12-Lead (Completed)   Edema of both legs (Chronic)    On Lasix. Intermittently using support stockings. Diuretic being dosed by nephrology.      Dizziness of unknown cause    I am truly not sure what his dizziness is related to. It doesn't sound like vertigo or orthostatic dizziness. It sounds probably related to peripheral neuropathy and claudication mediated balance issues. I recommend he walks with a cane and considers rehabilitation for balance.  I don't think that he has any vertebral artery disease, but will defer to Dr. Oneida Alar. Cannot exclude vertebrobasilar insufficiency - we'll need to monitor post CEA.      Coronary artery disease involving coronary bypass graft without angina pectoris - Primary (Chronic)    Had PCI to the SVG-PDA with a DES stent 4 years out from his CABG. He had mildly done shortly after which showed no ischemia. With his lack of mobility, I think we would definitely need to continue screening stress test on him probably every 2-3 years. He would therefore probably be due for one at this time next year.      Relevant Medications   furosemide (LASIX) 40 MG tablet   Other Relevant Orders   EKG 12-Lead (Completed)   Chronic diastolic CHF (congestive heart failure), NYHA class 2 (HCC) (Chronic)    Notable improvement in symptoms following PCI. He probably does have some exertional dyspnea related to diastolic dysfunction, but no real heart failure. Remains on furosemide twice a day per nephrology for lower extremity edema. This seems to be doing well. He is on carvedilol  and hydralazine/nitrate for afterload reduction along with Norvasc.      Relevant Medications   furosemide (LASIX) 40 MG tablet   Other Relevant Orders   EKG 12-Lead (Completed)   CAD S/P PCI SVG-RCA Xience Alpine DES 3.0 mm x 38 mm (ostial-prox) - 3.31mm (Chronic)    Overall, he seems to doing pretty well since his last PCI to his SVG-PDA in 2016. He had a Myoview shortly afterwards which was negative for ischemia. Really now is limited by claudication and has not had any significant anginal symptoms.  Plan: Continue aspirin and Plavix in addition to carvedilol, indoor and amlodipine. We discussed possibly stopping his aspirin, but with upcoming carotid endarterectomy, I would like to continue this until the operation is done. Continue statin      Relevant Medications   furosemide (LASIX) 40 MG tablet   Atherosclerotic peripheral vascular disease with intermittent claudication (HCC) (Chronic)    Now followed by Dr. Oneida Alar from vascular surgery. He does have limiting claudication symptoms, but I continue to recommend walking. Holding off on any invasive procedures because of his renal insufficiency.      Relevant Medications   furosemide (LASIX) 40 MG tablet   Asymptomatic stenosis of left carotid artery without infarction (Chronic)    Planned carotid endarterectomy for this fall - Dr. Oneida Alar.      Relevant Medications   furosemide (LASIX) 40 MG tablet   Angina, class I (HCC) (Chronic)    No further symptoms following PCI. Now class I.  Relevant Medications   furosemide (LASIX) 40 MG tablet    Other Visit Diagnoses   None.     Current medicines are reviewed at length with the patient today. (+/- concerns) n/a The following changes have been made: n/a Recommend using a cane to walk.  Need labs from Dr. Forde Dandy.  Studies Ordered:   Orders Placed This Encounter  Procedures  . EKG 12-Lead   Follow-Up:  6 months with Dr. Ellyn Hack.    Glenetta Hew, M.D.,  M.S. Interventional Cardiologist   Pager # [phone] Phone # [phone] [address] [place], [place] [address]

## 2016-03-06 ENCOUNTER — Encounter: Payer: Self-pay | Admitting: Cardiology

## 2016-03-06 DIAGNOSIS — R42 Dizziness and giddiness: Secondary | ICD-10-CM | POA: Insufficient documentation

## 2016-03-06 NOTE — Assessment & Plan Note (Signed)
Notable improvement in symptoms following PCI. He probably does have some exertional dyspnea related to diastolic dysfunction, but no real heart failure. Remains on furosemide twice a day per nephrology for lower extremity edema. This seems to be doing well. He is on carvedilol and hydralazine/nitrate for afterload reduction along with Norvasc.

## 2016-03-06 NOTE — Assessment & Plan Note (Signed)
He is on Crestor that he is taking every other day along with fenofibrate. He should have his labs checked soon. Would like to see the results to ensure that we are adequately controlling his levels. May want to consider the possibility of referring to lipid clinic for PCSK9 inhibitor.Marland Kitchen

## 2016-03-06 NOTE — Assessment & Plan Note (Signed)
I am truly not sure what his dizziness is related to. It doesn't sound like vertigo or orthostatic dizziness. It sounds probably related to peripheral neuropathy and claudication mediated balance issues. I recommend he walks with a cane and considers rehabilitation for balance.  I don't think that he has any vertebral artery disease, but will defer to Dr. Oneida Alar. Cannot exclude vertebrobasilar insufficiency - we'll need to monitor post CEA.

## 2016-03-06 NOTE — Assessment & Plan Note (Signed)
No further symptoms following PCI. Now class I.

## 2016-03-06 NOTE — Assessment & Plan Note (Signed)
Planned carotid endarterectomy for this fall - Dr. Oneida Alar.

## 2016-03-06 NOTE — Assessment & Plan Note (Signed)
On Lasix. Intermittently using support stockings. Diuretic being dosed by nephrology.

## 2016-03-06 NOTE — Assessment & Plan Note (Signed)
Now followed by Dr. Oneida Alar from vascular surgery. He does have limiting claudication symptoms, but I continue to recommend walking. Holding off on any invasive procedures because of his renal insufficiency.

## 2016-03-06 NOTE — Assessment & Plan Note (Signed)
Overall, he seems to doing pretty well since his last PCI to his SVG-PDA in 2016. He had a Myoview shortly afterwards which was negative for ischemia. Really now is limited by claudication and has not had any significant anginal symptoms.  Plan: Continue aspirin and Plavix in addition to carvedilol, indoor and amlodipine. We discussed possibly stopping his aspirin, but with upcoming carotid endarterectomy, I would like to continue this until the operation is done. Continue statin

## 2016-03-06 NOTE — Assessment & Plan Note (Addendum)
Well-controlled on current medications including carvedilol, Norvasc and hydralazine/Imdur.

## 2016-03-06 NOTE — Assessment & Plan Note (Signed)
Had PCI to the SVG-PDA with a DES stent 4 years out from his CABG. He had mildly done shortly after which showed no ischemia. With his lack of mobility, I think we would definitely need to continue screening stress test on him probably every 2-3 years. He would therefore probably be due for one at this time next year.

## 2016-03-08 DIAGNOSIS — Z23 Encounter for immunization: Secondary | ICD-10-CM | POA: Diagnosis not present

## 2016-03-13 ENCOUNTER — Other Ambulatory Visit: Payer: Self-pay | Admitting: Cardiology

## 2016-03-13 NOTE — Telephone Encounter (Signed)
Rx(s) sent to pharmacy electronically.  

## 2016-05-02 ENCOUNTER — Encounter: Payer: Self-pay | Admitting: Vascular Surgery

## 2016-05-08 ENCOUNTER — Other Ambulatory Visit: Payer: Self-pay

## 2016-05-08 ENCOUNTER — Encounter: Payer: Self-pay | Admitting: Vascular Surgery

## 2016-05-08 ENCOUNTER — Telehealth: Payer: Self-pay | Admitting: Cardiology

## 2016-05-08 ENCOUNTER — Ambulatory Visit (HOSPITAL_COMMUNITY)
Admission: RE | Admit: 2016-05-08 | Discharge: 2016-05-08 | Disposition: A | Discharge status: Home / Self Care | Payer: Medicare Other | Source: Ambulatory Visit | Attending: Vascular Surgery | Admitting: Vascular Surgery

## 2016-05-08 ENCOUNTER — Ambulatory Visit (INDEPENDENT_AMBULATORY_CARE_PROVIDER_SITE_OTHER): Payer: Medicare Other | Admitting: Vascular Surgery

## 2016-05-08 VITALS — BP 131/58 | HR 56 | Temp 97.0°F | Resp 16 | Ht 68.5 in | Wt 208.0 lb

## 2016-05-08 DIAGNOSIS — Z9861 Coronary angioplasty status: Secondary | ICD-10-CM

## 2016-05-08 DIAGNOSIS — I25810 Atherosclerosis of coronary artery bypass graft(s) without angina pectoris: Secondary | ICD-10-CM

## 2016-05-08 DIAGNOSIS — I6522 Occlusion and stenosis of left carotid artery: Secondary | ICD-10-CM | POA: Diagnosis not present

## 2016-05-08 DIAGNOSIS — Z01818 Encounter for other preprocedural examination: Secondary | ICD-10-CM

## 2016-05-08 DIAGNOSIS — Z01810 Encounter for preprocedural cardiovascular examination: Secondary | ICD-10-CM | POA: Diagnosis not present

## 2016-05-08 DIAGNOSIS — I209 Angina pectoris, unspecified: Secondary | ICD-10-CM | POA: Diagnosis not present

## 2016-05-08 DIAGNOSIS — I2510 Atherosclerotic heart disease of native coronary artery without angina pectoris: Secondary | ICD-10-CM

## 2016-05-08 DIAGNOSIS — I6523 Occlusion and stenosis of bilateral carotid arteries: Secondary | ICD-10-CM | POA: Insufficient documentation

## 2016-05-08 DIAGNOSIS — I739 Peripheral vascular disease, unspecified: Secondary | ICD-10-CM | POA: Diagnosis not present

## 2016-05-08 DIAGNOSIS — R9431 Abnormal electrocardiogram [ECG] [EKG]: Secondary | ICD-10-CM | POA: Diagnosis not present

## 2016-05-08 DIAGNOSIS — Z951 Presence of aortocoronary bypass graft: Secondary | ICD-10-CM

## 2016-05-08 LAB — VAS US CAROTID
LCCADDIAS: -7 cm/s
LCCADSYS: -89 cm/s
LCCAPSYS: 137 cm/s
LEFT ECA DIAS: -3 cm/s
LEFT VERTEBRAL DIAS: -13 cm/s
LICADDIAS: -18 cm/s
LICADSYS: -69 cm/s
Left CCA prox dias: 13 cm/s
Left ICA prox dias: -96 cm/s
Left ICA prox sys: -450 cm/s
RCCADSYS: -125 cm/s
RCCAPDIAS: 10 cm/s
RIGHT CCA MID DIAS: 13 cm/s
RIGHT ECA DIAS: -7 cm/s
RIGHT VERTEBRAL DIAS: -7 cm/s
Right CCA prox sys: 113 cm/s

## 2016-05-08 NOTE — Telephone Encounter (Signed)
I talked with Dr. Oneida Alar. The patient has upcoming carotid endarterectomy scheduled for November 29. We are hoping to see if we get him in for a Lexiscan Myoview stress test next week.   Glenetta Hew, MD

## 2016-05-08 NOTE — Telephone Encounter (Signed)
ORDER PLACED FOR LEXISCAN MYOVIEW. RN SENT INFORMATION TO SCHEDULER TO SCHEDULE. PATIENT WILL RECEIVE PHONE CALL OF TIME.

## 2016-05-08 NOTE — Progress Notes (Signed)
[facility] [place] HISTORY AND PHYSICAL     History of Present Illness:  Patient is a 70 y.o. male who presents for evaluation of asymptomatic carotid stenosis and bilateral lower extremity claudication.  His carotid stenosis was originally detected on workup for coronary bypass grafting. He denies any symptoms of TIA amaurosis or stroke.   He also has bilateral lower extremity claudication. He is currently walking 30 minutes daily in the morning to try to improve his walking distance.  He experiences claudication after walking on a treadmill at 2 miles per hour for 9 minutes. He rests for about 30 seconds and is able to get up to a total time of 35 minutes. 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 He is able to walk about 1 block prior to experiencing symptoms. He denies rest pain. He does have some numbness and tingling in his feet secondary to neuropathy.   Other medical problems include hypertension, diabetes, coronary disease, elevated cholesterol, migraine, diabetic retinopathy. These are all currently stable. He also has an element of renal dysfunction and has seen Dr. Moshe Cipro. He states his baseline creatinine is around 2.  He is on Plavix and aspirin and statin. He takes his aspirin every other day to try to reduce bleeding episodes to his eye. He also complains of intermittent swelling in both legs left greater than right this is somewhat improved by mild compression stockings.          Past Medical History    Diagnosis   Date    .   CAD S/P percutaneous coronary angioplasty   06/12/2011; 09/2104        a) 2012: Complex PCI mRCA ([facility]) --  2 overlapping Promus element DES stents.; 09/2014:  ost-prox SVG-rPDA - Xience DES 3.0 mm x 38 mm; (3.4  - 3.3 mm)    .   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  Likely thromboembolic event with PDA occlusion.   06/12/2011        Post Complex PCI    .   S/P CABG x 3   07/01/2013        a. 07/01/2013 (Cath for Crescendo Unstable Angina) --> [name]: For LM disease; LIMA-LAD, SVG-RCA, SCG-Cx; b. 09/2014 Cath/Staged PCI: patent LIMA->LAD, VG->OM1/RI, VG->RCA 80% (3.0x38 Xience Alpine DES on 10/12/2014).    Marland Kitchen   CAD (coronary artery disease), autologous vein bypass graft   09/2014        10/03/14: Cath for Class III-IV Angina -- 80% ostial-proximal SVG-RPDA (minimal flow down native PDA from native RCA with 50% proximal and distal, patent LIMA-LAD, SVG-OM with retrograde filling of OM 2. --> 10/12/14: Staged PCI of SVG-RPDA - Xience DES  3.0 mm x 38 mm; (3.4  - 3.3 mm)    .   Aortic sclerosis  - without Stenosis    March 2014        Echo: Aortic sclerosis without stenosis. EF 55-60% with normal WM; mild concentric hypertrophy with normal relaxation.    Marland Kitchen   PAD (peripheral artery disease)          with claudication    .   High cholesterol      .   Hypertension      .   GERD (gastroesophageal reflux disease)      .  Anxiety      .   Complication of anesthesia          "I've had name recall recognition problems since my last anesthesia"    .   Heart murmur      .   Childhood asthma      .   DM (diabetes mellitus) type II uncontrolled with eye manifestation   2013        Diabetic retinopathy with retinal hemorrhages since;, recurrent in August 2014 treated with Avastin shots    .   Migraine   ~ 2004        "once"    .   CKD (chronic kidney disease), stage III          "mild; related to diabetes" (10/02/2014)    .   Anemia                Past Surgical History    Procedure   Laterality   Date    .   Tonsillectomy        .   Nm myoview ltd     January '13        Diaphragmatic attenuation versus inferior infarct. No ischemia. Normal EF normal wall motion.    Marland Kitchen   Nm myoview ltd     June 2014        no ischemia    .   Coronary angioplasty with stent  placement   Right   September 2012        2 overlapping Promus DES 2.7 mm at 12 mm x2; postdilated to 3 mm    .   Cardiac catheterization     06/28/2013        ostial LM disese, RCA ~40-50%ISR    .   Retinal laser procedure   Bilateral          "more than once"     .   Intraoperative transesophageal echocardiogram   N/A   07/01/2013        Procedure: INTRAOPERATIVE TRANSESOPHAGEAL ECHOCARDIOGRAM;  Surgeon: Grace Isaac, MD;  Location: [facility];  Service: Open Heart Surgery;  Laterality: N/A;    .   Transthoracic echocardiogram     06/29/2013        Focal basal hypertrophy with normal LV size. EF 60-65% no regional WMA. Mild LA dilation    .   Left heart catheterization with coronary angiogram   N/A   06/11/2011        Procedure: LEFT HEART CATHETERIZATION WITH CORONARY ANGIOGRAM;  Surgeon: Fulton Reek, MD;  Location: [facility] CATH LAB;  Service: Cardiovascular;  Laterality: N/A;    .   Percutaneous coronary stent intervention (pci-s)     06/11/2011        Procedure: PERCUTANEOUS CORONARY STENT INTERVENTION (PCI-S);  Surgeon: Fulton Reek, MD;  Location: [facility] CATH LAB;  Service: Cardiovascular;;    .   Left heart catheterization with coronary angiogram   N/A   06/28/2013        Procedure: LEFT HEART CATHETERIZATION WITH CORONARY ANGIOGRAM;  Surgeon: Leonie Man, MD;  Location: [facility] CATH LAB;  Service: Cardiovascular;  Laterality: N/A;    .   Coronary artery bypass graft   N/A   07/01/2013        Procedure: CORONARY ARTERY BYPASS GRAFTING (CABG);  Surgeon: Grace Isaac, MD;  Location: [facility];  Service: Open Heart Surgery;  Laterality: N/A;  Times 3 using  left internal mammary artery and endoscopically harvested bilateral saphenous vein    .   Left and right heart catheterization with coronary/graft angiogram   N/A   10/03/2014        Procedure: LEFT AND RIGHT HEART CATHETERIZATION WITH Beatrix Fetters;  Surgeon: Leonie Man, MD;  Location: [facility] CATH LAB;  Service: Cardiovascular;   Laterality: N/A;    .   Percutaneous coronary stent intervention (pci-s)   N/A   10/12/2014        Procedure: PERCUTANEOUS CORONARY STENT INTERVENTION (PCI-S);  Surgeon: Leonie Man, MD;  Location: [facility] CATH LAB;  Ostial-Prox SVG-RCA Xience Alpine DES 3.0 mm x 38 mm; (3.4  - 3.3 mm)      History           Social History    .   Marital Status:   Married        Spouse Name:   N/A    .   Number of Children:   N/A    .   Years of Education:   N/A         Occupational History    .   Not on file.            Social History Main Topics    .   Smoking status:   Former Smoker -- 2.00 packs/day for 27 years        Types:   Cigarettes        Quit date:   11/23/1996    .   Smokeless tobacco:   Never Used    .   Alcohol Use:   0.0 oz/week          Comment: 10/02/2014  "some weeks I might have 2-3 drinks then go for a month and not have any"    .   Drug Use:   No    .   Sexual Activity:   Not Currently          Other Topics   Concern    .   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 He notably has not been exercising like he used to. He [place] He has social alcoholic beverages but is trying to cut that back and does not smoke.            He tells me he is a sucker for chips but does not eat sweets because of diabetes.        Family History       Family History     Problem    Relation    Age of Onset     .    Cancer    Sister           Lung cancer     .    Cancer    Mother       .    Cancer    Father       .    Hyperlipidemia    Father       .    Hypertension    Father       .    Cancer    Maternal Grandmother       .    Cancer  Paternal Grandfather           Allergies        Allergies     Allergen    Reactions     .    Atorvastatin    Other (See Comments)         Leg pain     .    Ibuprofen    Hives     .    Pravastatin        .    Simvastatin    Other (See Comments)         Leg pain             Current Outpatient Prescriptions on File Prior to Visit    Medication   Sig   Dispense   Refill    .   acetaminophen (TYLENOL) 325 MG tablet   Take 650 mg by mouth every 4 (four) hours as needed for mild pain.         Marland Kitchen   amLODipine (NORVASC) 10 MG tablet   TAKE 1 TABLET ONCE DAILY.   30 tablet   9    .   aspirin EC 81 MG tablet   Take 1 tablet (81 mg total) by mouth every other day.        .   B-D ULTRAFINE III SHORT PEN 31G X 8 MM MISC   See admin instructions.     0    .   carvedilol (COREG) 12.5 MG tablet   Take 1 tablet (12.5 mg total) by mouth 2 (two) times daily.   60 tablet   11    .   clopidogrel (PLAVIX) 75 MG tablet   Take 1 tablet (75 mg total) by mouth daily with breakfast.   30 tablet   11    .   CRESTOR 10 MG tablet   TAKE ONE TABLET EVERY OTHER DAY.   15 tablet   11    .   fenofibrate 160 MG tablet   Take 160 mg by mouth daily.        .   furosemide (LASIX) 40 MG tablet   Take 1 tablet (40 mg total) by mouth daily. (Patient taking differently: Take 40 mg by mouth 2 (two) times daily. )   30 tablet   11    .   HUMALOG KWIKPEN 100 UNIT/ML SOPN   Inject 10-15 Units into the skin 3 (three) times daily. Sliding scale. 10 units with breakfast and 15 units with lunch and supper        .   hydrALAZINE (APRESOLINE) 100 MG tablet   Take 1 tablet (100 mg total) by mouth 2 (two) times daily.   60 tablet   11    .   insulin glargine (LANTUS) 100 UNIT/ML injection   Inject 40-70 Units into the skin 2 (two) times daily. 40 units in am, 70 units in pm        .   isosorbide mononitrate (IMDUR) 60 MG 24 hr tablet   Take 1 tablet (60 mg total) by mouth daily.   30 tablet   11    .   NITROSTAT 0.4 MG SL tablet   DISSOLVE 1 TABLET UNDER TONGUE AS NEEDED FOR CHEST PAIN,MAY REPEAT IN5 MINUTES FOR 2 DOSES.   25 tablet   1    .   pantoprazole (PROTONIX) 40 MG tablet   Take 40 mg by mouth daily.  Current  Facility-Administered Medications on File Prior to Visit    Medication   Dose   Route   Frequency   Provider   Last Rate   Last Dose    .   0.9 %  sodium chloride infusion     Intravenous   Continuous   Leonie Man, MD            ROS:       Cardiac: He denies chest pain.   Respiratory: He denies shortness of breath except with severe exertion   Urinary: [x ] chronic Kidney disease, [ ]  on HD - [ ]  MWF or [ ]  TTHS, [ ]  Burning with urination, [ ]  Frequent urination, [ ]  Difficulty urinating;         Physical Examination    Vitals:   05/08/16 1640  BP: (!) 131/58  Pulse: (!) 56  Resp: 16  Temp: 97 F (36.1 C)  SpO2: 96%  Weight: 208 lb (94.3 kg)  Height: 5' 8.5" (1.74 m)   General:  Alert and oriented, no acute distress HEENT: Normal Neck: Soft left carotid bruit no right bruit Pulmonary: Clear to auscultation bilaterally Cardiac: Regular Rate and Rhythm without murmur Skin: No rash, no skin cracks or ulcers on the feet Extremity Pulses:  2+ radial, brachial, femoral pulses bilaterally absent popliteal and pedal pulses bilaterally Musculoskeletal: No deformity trace edema bilaterally left greater than right      Neurologic: Upper and lower extremity motor 5/5 and symmetric, no facial asymmetry   DATA:   He had a carotid duplex exam which showed a 60-80% left internal carotid artery stenosis peak systolic velocity 102 and diastolic 96 antegrade vertebral flow bilaterally.  Stenosis most likely underestimated due to severe calcification. Previously he also had bilateral lower extremity arterial duplex scan July 2017. This showed a patent but narrowed right superficial femoral artery in the proximal and distal segment. He also had a 50-75% stenosis of the left common femoral artery and a greater than 75% stenosis of the distal left superficial femoral artery I reviewed and interpreted this study.     ASSESSMENT:  #1 claudication lifestyle limiting but reluctant to proceed  with arteriogram secondary to renal dysfunction.antiplatelet and statin therapy.   #2     carotid occlusive disease Velocities in the left carotid had increased significantly. Most likely he has a high-grade stenosis on the left side. Lesion was calcified and most likely underestimated by duplex. I discussed with the patient today the possibility of left carotid endarterectomy for stroke prophylaxis. Risk of operation cranial nerve injury stroke risk bleeding infection perioperative risks were discussed with the patient. He is ready to proceed with operation. I discussed with him signs and symptoms of TIA amaurosis or stroke.     PLAN:    Repeat ABIs and arterial duplex in 6 months. Continue walking program and risk factor modification.  Left carotid endarterectomy scheduled for 05/21/2016. The patient will be cardiac risk stratified prior to this. I spoke with Dr. Ellyn Hack his cardiologist regarding this today.   Ruta Hinds, MD [facility] [place] Office: [phone] Pager: [phone]

## 2016-05-08 NOTE — Telephone Encounter (Signed)
Dr.Fields is calling to speak with Dr. Ellyn Hack

## 2016-05-09 ENCOUNTER — Telehealth (HOSPITAL_COMMUNITY): Payer: Self-pay

## 2016-05-09 ENCOUNTER — Other Ambulatory Visit: Payer: Self-pay | Admitting: *Deleted

## 2016-05-09 ENCOUNTER — Telehealth: Payer: Self-pay | Admitting: Cardiology

## 2016-05-09 DIAGNOSIS — Z01818 Encounter for other preprocedural examination: Secondary | ICD-10-CM

## 2016-05-09 DIAGNOSIS — I739 Peripheral vascular disease, unspecified: Secondary | ICD-10-CM

## 2016-05-09 NOTE — Telephone Encounter (Signed)
Encounter complete. 

## 2016-05-09 NOTE — Telephone Encounter (Signed)
New Message  Pt states he received a call. Please call back to discuss

## 2016-05-09 NOTE — Telephone Encounter (Signed)
Spoke with pt, aware of nuclear stress time and location. Discussed instructions with the patient.

## 2016-05-12 ENCOUNTER — Telehealth: Payer: Self-pay | Admitting: Vascular Surgery

## 2016-05-12 NOTE — Addendum Note (Signed)
Addended by: Lianne Cure A on: 05/12/2016 09:37 AM   Modules accepted: Orders

## 2016-05-12 NOTE — Telephone Encounter (Signed)
Patient is aware of appt for nuclear stress test at [facility] location on 05/14/16 at 7:15am. awt

## 2016-05-13 DIAGNOSIS — N183 Chronic kidney disease, stage 3 (moderate): Secondary | ICD-10-CM | POA: Diagnosis not present

## 2016-05-13 DIAGNOSIS — Z6830 Body mass index (BMI) 30.0-30.9, adult: Secondary | ICD-10-CM | POA: Diagnosis not present

## 2016-05-13 DIAGNOSIS — I25810 Atherosclerosis of coronary artery bypass graft(s) without angina pectoris: Secondary | ICD-10-CM | POA: Diagnosis not present

## 2016-05-13 DIAGNOSIS — I5032 Chronic diastolic (congestive) heart failure: Secondary | ICD-10-CM | POA: Diagnosis not present

## 2016-05-13 DIAGNOSIS — R946 Abnormal results of thyroid function studies: Secondary | ICD-10-CM | POA: Diagnosis not present

## 2016-05-13 DIAGNOSIS — E784 Other hyperlipidemia: Secondary | ICD-10-CM | POA: Diagnosis not present

## 2016-05-13 DIAGNOSIS — D649 Anemia, unspecified: Secondary | ICD-10-CM | POA: Diagnosis not present

## 2016-05-13 DIAGNOSIS — I739 Peripheral vascular disease, unspecified: Secondary | ICD-10-CM | POA: Diagnosis not present

## 2016-05-13 DIAGNOSIS — N08 Glomerular disorders in diseases classified elsewhere: Secondary | ICD-10-CM | POA: Diagnosis not present

## 2016-05-13 DIAGNOSIS — I10 Essential (primary) hypertension: Secondary | ICD-10-CM | POA: Diagnosis not present

## 2016-05-13 DIAGNOSIS — E1142 Type 2 diabetes mellitus with diabetic polyneuropathy: Secondary | ICD-10-CM | POA: Diagnosis not present

## 2016-05-13 DIAGNOSIS — D6489 Other specified anemias: Secondary | ICD-10-CM | POA: Diagnosis not present

## 2016-05-13 DIAGNOSIS — E1139 Type 2 diabetes mellitus with other diabetic ophthalmic complication: Secondary | ICD-10-CM | POA: Diagnosis not present

## 2016-05-14 ENCOUNTER — Ambulatory Visit (HOSPITAL_COMMUNITY)
Admission: RE | Admit: 2016-05-14 | Discharge: 2016-05-14 | Disposition: A | Discharge status: Home / Self Care | Payer: Medicare Other | Source: Ambulatory Visit | Attending: Internal Medicine | Admitting: Internal Medicine

## 2016-05-14 DIAGNOSIS — Z01818 Encounter for other preprocedural examination: Secondary | ICD-10-CM | POA: Diagnosis not present

## 2016-05-14 DIAGNOSIS — I2510 Atherosclerotic heart disease of native coronary artery without angina pectoris: Secondary | ICD-10-CM

## 2016-05-14 DIAGNOSIS — N183 Chronic kidney disease, stage 3 (moderate): Secondary | ICD-10-CM | POA: Insufficient documentation

## 2016-05-14 DIAGNOSIS — I5032 Chronic diastolic (congestive) heart failure: Secondary | ICD-10-CM | POA: Insufficient documentation

## 2016-05-14 DIAGNOSIS — Z87891 Personal history of nicotine dependence: Secondary | ICD-10-CM | POA: Diagnosis not present

## 2016-05-14 DIAGNOSIS — Z951 Presence of aortocoronary bypass graft: Secondary | ICD-10-CM | POA: Insufficient documentation

## 2016-05-14 DIAGNOSIS — I700 Atherosclerosis of aorta: Secondary | ICD-10-CM | POA: Insufficient documentation

## 2016-05-14 DIAGNOSIS — J45909 Unspecified asthma, uncomplicated: Secondary | ICD-10-CM | POA: Diagnosis not present

## 2016-05-14 DIAGNOSIS — I130 Hypertensive heart and chronic kidney disease with heart failure and stage 1 through stage 4 chronic kidney disease, or unspecified chronic kidney disease: Secondary | ICD-10-CM | POA: Diagnosis not present

## 2016-05-14 DIAGNOSIS — Z9861 Coronary angioplasty status: Secondary | ICD-10-CM

## 2016-05-14 DIAGNOSIS — I209 Angina pectoris, unspecified: Secondary | ICD-10-CM | POA: Insufficient documentation

## 2016-05-14 LAB — MYOCARDIAL PERFUSION IMAGING
CHL CUP RESTING HR STRESS: 48 {beats}/min
LVDIAVOL: 154 mL (ref 62–150)
LVSYSVOL: 70 mL
NUC STRESS TID: 0.95
Peak HR: 62 {beats}/min
SDS: 0
SRS: 0
SSS: 0

## 2016-05-14 MED ORDER — TECHNETIUM TC 99M TETROFOSMIN IV KIT
31.5000 | PACK | Freq: Once | INTRAVENOUS | Status: AC | PRN
  Administered 2016-05-14: 31.5 via INTRAVENOUS
  Filled 2016-05-14: qty 32

## 2016-05-14 MED ORDER — TECHNETIUM TC 99M TETROFOSMIN IV KIT
10.5000 | PACK | Freq: Once | INTRAVENOUS | Status: AC | PRN
  Administered 2016-05-14: 10.5 via INTRAVENOUS
  Filled 2016-05-14: qty 11

## 2016-05-14 MED ORDER — REGADENOSON 0.4 MG/5ML IV SOLN
0.4000 mg | Freq: Once | INTRAVENOUS | Status: AC
Start: 2016-05-14 — End: 2016-05-14
  Administered 2016-05-14: 0.4 mg via INTRAVENOUS

## 2016-05-19 ENCOUNTER — Encounter (HOSPITAL_COMMUNITY)
Admission: RE | Admit: 2016-05-19 | Discharge: 2016-05-19 | Disposition: A | Discharge status: Home / Self Care | Payer: Medicare Other | Source: Ambulatory Visit | Attending: Vascular Surgery | Admitting: Vascular Surgery

## 2016-05-19 ENCOUNTER — Encounter (HOSPITAL_COMMUNITY): Payer: Self-pay

## 2016-05-19 DIAGNOSIS — Z955 Presence of coronary angioplasty implant and graft: Secondary | ICD-10-CM

## 2016-05-19 DIAGNOSIS — Z01812 Encounter for preprocedural laboratory examination: Secondary | ICD-10-CM

## 2016-05-19 DIAGNOSIS — I2510 Atherosclerotic heart disease of native coronary artery without angina pectoris: Secondary | ICD-10-CM | POA: Insufficient documentation

## 2016-05-19 DIAGNOSIS — R011 Cardiac murmur, unspecified: Secondary | ICD-10-CM | POA: Insufficient documentation

## 2016-05-19 DIAGNOSIS — E119 Type 2 diabetes mellitus without complications: Secondary | ICD-10-CM

## 2016-05-19 DIAGNOSIS — Z951 Presence of aortocoronary bypass graft: Secondary | ICD-10-CM

## 2016-05-19 HISTORY — DX: Other specified postprocedural states: R11.2

## 2016-05-19 HISTORY — DX: Heart failure, unspecified: I50.9

## 2016-05-19 HISTORY — DX: Dyspnea, unspecified: R06.00

## 2016-05-19 HISTORY — DX: Other specified postprocedural states: Z98.890

## 2016-05-19 LAB — APTT: APTT: 29 s (ref 24–36)

## 2016-05-19 LAB — COMPREHENSIVE METABOLIC PANEL
ALBUMIN: 4.1 g/dL (ref 3.5–5.0)
ALK PHOS: 57 U/L (ref 38–126)
ALT: 50 U/L (ref 17–63)
AST: 41 U/L (ref 15–41)
Anion gap: 8 (ref 5–15)
BILIRUBIN TOTAL: 0.4 mg/dL (ref 0.3–1.2)
BUN: 37 mg/dL — AB (ref 6–20)
CALCIUM: 9.4 mg/dL (ref 8.9–10.3)
CO2: 25 mmol/L (ref 22–32)
Chloride: 104 mmol/L (ref 101–111)
Creatinine, Ser: 2.45 mg/dL — ABNORMAL HIGH (ref 0.61–1.24)
GFR calc Af Amer: 29 mL/min — ABNORMAL LOW (ref >60)
GFR calc non Af Amer: 25 mL/min — ABNORMAL LOW (ref >60)
GLUCOSE: 138 mg/dL — AB (ref 65–99)
Potassium: 4 mmol/L (ref 3.5–5.1)
Sodium: 137 mmol/L (ref 135–145)
TOTAL PROTEIN: 6.7 g/dL (ref 6.5–8.1)

## 2016-05-19 LAB — PROTIME-INR
INR: 0.96
Prothrombin Time: 12.8 seconds (ref 11.4–15.2)

## 2016-05-19 LAB — CBC
HEMATOCRIT: 38.1 % — AB (ref 39.0–52.0)
HEMOGLOBIN: 12.3 g/dL — AB (ref 13.0–17.0)
MCH: 27.6 pg (ref 26.0–34.0)
MCHC: 32.3 g/dL (ref 30.0–36.0)
MCV: 85.6 fL (ref 78.0–100.0)
Platelets: 299 10*3/uL (ref 150–400)
RBC: 4.45 MIL/uL (ref 4.22–5.81)
RDW: 13.2 % (ref 11.5–15.5)
WBC: 6.1 10*3/uL (ref 4.0–10.5)

## 2016-05-19 LAB — SURGICAL PCR SCREEN
MRSA, PCR: NEGATIVE
STAPHYLOCOCCUS AUREUS: NEGATIVE

## 2016-05-19 LAB — URINALYSIS, ROUTINE W REFLEX MICROSCOPIC
BILIRUBIN URINE: NEGATIVE
Glucose, UA: NEGATIVE mg/dL
Hgb urine dipstick: NEGATIVE
Ketones, ur: NEGATIVE mg/dL
Leukocytes, UA: NEGATIVE
NITRITE: NEGATIVE
PROTEIN: NEGATIVE mg/dL
SPECIFIC GRAVITY, URINE: 1.012 (ref 1.005–1.030)
pH: 5 (ref 5.0–8.0)

## 2016-05-19 LAB — TYPE AND SCREEN
ABO/RH(D): B POS
Antibody Screen: NEGATIVE

## 2016-05-19 LAB — GLUCOSE, CAPILLARY: Glucose-Capillary: 124 mg/dL — ABNORMAL HIGH (ref 65–99)

## 2016-05-19 NOTE — Pre-Procedure Instructions (Addendum)
Lance Collier  05/19/2016      [place], [place] [address] Phone: [phone] Fax: [phone]    Your procedure is scheduled on November 29 @ 8:30am.  Report to [facility] at [phone].M.  Call this number if you have problems the morning of surgery:  [phone]   Remember:  Do not eat food or drink liquids after midnight.  Take these medicines the morning of surgery with A SIP OF WATER Tylenol, Aspirin, Carvedilol, Plavix, Imdur, Nitroglycerin if needed, Protonix,  Topamax.    How to Manage Your Diabetes Before and After Surgery  Why is it important to control my blood sugar before and after surgery? . Improving blood sugar levels before and after surgery helps healing and can limit problems. . A way of improving blood sugar control is eating a healthy diet by: o  Eating less sugar and carbohydrates o  Increasing activity/exercise o  Talking with your doctor about reaching your blood sugar goals . High blood sugars (greater than 180 mg/dL) can raise your risk of infections and slow your recovery, so you will need to focus on controlling your diabetes during the weeks before surgery. . Make sure that the doctor who takes care of your diabetes knows about your planned surgery including the date and location.  How do I manage my blood sugar before surgery? . Check your blood sugar at least 4 times a day, starting 2 days before surgery, to make sure that the level is not too high or low. o Check your blood sugar the morning of your surgery when you wake up and every 2 hours until you get to the Short Stay unit. . If your blood sugar is less than 70 mg/dL, you will need to treat for low blood sugar: o Do not take insulin. o Treat a low blood sugar (less than 70 mg/dL) with  cup of clear juice (cranberry or apple), 4 glucose tablets, OR glucose gel. o Recheck blood  sugar in 15 minutes after treatment (to make sure it is greater than 70 mg/dL). If your blood sugar is not greater than 70 mg/dL on recheck, call [phone] for further instructions. . Report your blood sugar to the short stay nurse when you get to Short Stay.  . If you are admitted to the hospital after surgery: o Your blood sugar will be checked by the staff and you will probably be given insulin after surgery (instead of oral diabetes medicines) to make sure you have good blood sugar levels. o The goal for blood sugar control after surgery is 80-180 mg/dL.              WHAT DO I DO ABOUT MY DIABETES MEDICATION?   Marland Kitchen Do not take oral diabetes medicines (pills) the morning of surgery.  . THE NIGHT BEFORE SURGERY, take _____40______ units of ___Lantus________insulin.       . THE MORNING OF SURGERY, take _____20________ units of ____Lantus______insulin.  . The day of surgery, do not take other diabetes injectables, including Byetta (exenatide), Bydureon (exenatide ER), Victoza (liraglutide), or Trulicity (dulaglutide).   Other Instructions:          Patient Signature:  Date:   Nurse Signature:  Date:   Reviewed and Endorsed by [facility] Patient Education Committee, August 2015  Do not wear jewelry.  Do not wear lotions, powders, cologne, or deoderant.  Men may  shave face and neck.  Do not bring valuables to the hospital.  [facility] is not responsible for any belongings or valuables.  Contacts, dentures or bridgework may not be worn into surgery.  Leave your suitcase in the car.  After surgery it may be brought to your room.  For patients admitted to the hospital, discharge time will be determined by your treatment team.  Patients discharged the day of surgery will not be allowed to drive home.   Name and phone number of your driver:    Special instructions:   [facility]- Preparing For Surgery  Before surgery, you can play an important role. Because skin  is not sterile, your skin needs to be as free of germs as possible. You can reduce the number of germs on your skin by washing with CHG (chlorahexidine gluconate) Soap before surgery.  CHG is an antiseptic cleaner which kills germs and bonds with the skin to continue killing germs even after washing.  Please do not use if you have an allergy to CHG or antibacterial soaps. If your skin becomes reddened/irritated stop using the CHG.  Do not shave (including legs and underarms) for at least 48 hours prior to first CHG shower. It is OK to shave your face.  Please follow these instructions carefully.   1. Shower the NIGHT BEFORE SURGERY and the MORNING OF SURGERY with CHG.   2. If you chose to wash your hair, wash your hair first as usual with your normal shampoo.  3. After you shampoo, rinse your hair and body thoroughly to remove the shampoo.  4. Use CHG as you would any other liquid soap. You can apply CHG directly to the skin and wash gently with a scrungie or a clean washcloth.   5. Apply the CHG Soap to your body ONLY FROM THE NECK DOWN.  Do not use on open wounds or open sores. Avoid contact with your eyes, ears, mouth and genitals (private parts). Wash genitals (private parts) with your normal soap.  6. Wash thoroughly, paying special attention to the area where your surgery will be performed.  7. Thoroughly rinse your body with warm water from the neck down.  8. DO NOT shower/wash with your normal soap after using and rinsing off the CHG Soap.  9. Pat yourself dry with a CLEAN TOWEL.   10. Wear CLEAN PAJAMAS   11. Place CLEAN SHEETS on your bed the night of your first shower and DO NOT SLEEP WITH PETS.    Day of Surgery: Do not apply any deodorants/lotions. Please wear clean clothes to the hospital/surgery center.      Please read over the following fact sheets that you were given. Pain Booklet and Coughing and Deep Breathing

## 2016-05-19 NOTE — Progress Notes (Addendum)
PCP Dr. Forde Dandy and manages his diabetes. Cardiologist Dr. Ellyn Hack. Echo 09/25/14, stress test 05/14/16, cardiac cath 10/03/14. Patient denies any chest pain or shortness of breath at this time. Patient voiced concerns about blood sugar being elevated on day of surgery. Instructed patient to decrease insulin coverage per anesthesia guidelines.  Reported his fasting CBGs 180-200s. Encouraged him to speak with Dr. Forde Dandy about insulin coverage on day of surgery. Verbalized understanding. Reports HgbA1C was 9 on 05/13/16. Labs requested from Dr. Forde Dandy. Instructed to continue taking aspirin and plavix by Dr. Oneida Alar.

## 2016-05-20 NOTE — Progress Notes (Signed)
Anesthesia Chart Review:  Pt is a 70 year old male scheduled for L CEA on 05/21/2016 with Ruta Hinds, MD.   - PCP is Reynold Bowen, MD who is aware of upcoming surgery.  - Cardiologist is Glenetta Hew, MD who cleared pt for surgery.   PMH includes:  CAD (DES x2 RCA 2012; s/p CABG 2015; DES to SVG-PDA 2016), CHF, PAD, HTN, DM, hyperlipidemia, heart murmur, CKD (stage III), anemia, post-op N/V, GERD.  Former smoker. BMI 32  Medications include: amlodipine, ASA, carvedilol, plavix, fenofibrate, lasix, humalog, hydralazine, lantus, imdur, protonix, crestor, vascepa. Pt to continue ASA and plavix perioperatively.   Preoperative labs reviewed.  Cr 2.45, BUN 37. This is consistent with prior results from PCP's office.   EKG 03/04/16: sinus bradycardia, premature ventricular contractions (PVC) and Borderline 1 AVB (PR interval 200). Voltage criteria for LVH, nonspecific ST-T wave changes.; otherwise normal axis, intervals and durations.  Nuclear stress test 05/14/16:   Nuclear stress EF: 55%.  The left ventricular ejection fraction is normal (55-65%).  There was no ST segment deviation noted during stress.  The study is normal.  This is a low risk study.  Cardiac cath 10/03/14:  - Severe proximal ~80% stenosis of SVG-RCA as culprit lesion (distal RCA with competetive flow & ~60% hazy ISR in native RCA. S/p DES to SVG-RCA on 10/12/14.  - Patent LIMA-LAD & SVG-OM1/RI with retrograde filling OM2 - Known native LM disease with moderate OM2 disease. - Normal CO/CI by Fick & TD - Moderate to severely elevated LVEDP/PCWP (EDP ~22 mmHg, PCWP 28 mmHg) - with severe systemic HTN  If no changes, I anticipate pt can proceed with surgery as scheduled.   Willeen Cass, FNP-BC [facility] Short Stay Surgical Center/Anesthesiology Phone: [phone] 05/20/2016 4:39 PM

## 2016-05-21 ENCOUNTER — Inpatient Hospital Stay (HOSPITAL_COMMUNITY): Payer: Medicare Other | Admitting: Anesthesiology

## 2016-05-21 ENCOUNTER — Encounter (HOSPITAL_COMMUNITY)
Admission: RE | Disposition: A | Discharge status: Home / Self Care | Payer: Self-pay | Source: Ambulatory Visit | Attending: Vascular Surgery

## 2016-05-21 ENCOUNTER — Inpatient Hospital Stay (HOSPITAL_COMMUNITY)
Admission: RE | Admit: 2016-05-21 | Discharge: 2016-05-22 | DRG: 038 | Disposition: A | Discharge status: Home / Self Care | Payer: Medicare Other | Source: Ambulatory Visit | Attending: Vascular Surgery | Admitting: Vascular Surgery

## 2016-05-21 ENCOUNTER — Encounter (HOSPITAL_COMMUNITY): Payer: Self-pay | Admitting: Anesthesiology

## 2016-05-21 ENCOUNTER — Inpatient Hospital Stay (HOSPITAL_COMMUNITY): Payer: Medicare Other | Admitting: Emergency Medicine

## 2016-05-21 DIAGNOSIS — Z955 Presence of coronary angioplasty implant and graft: Secondary | ICD-10-CM

## 2016-05-21 DIAGNOSIS — I499 Cardiac arrhythmia, unspecified: Secondary | ICD-10-CM | POA: Diagnosis present

## 2016-05-21 DIAGNOSIS — Z888 Allergy status to other drugs, medicaments and biological substances status: Secondary | ICD-10-CM | POA: Diagnosis not present

## 2016-05-21 DIAGNOSIS — Z7902 Long term (current) use of antithrombotics/antiplatelets: Secondary | ICD-10-CM | POA: Diagnosis not present

## 2016-05-21 DIAGNOSIS — E1151 Type 2 diabetes mellitus with diabetic peripheral angiopathy without gangrene: Secondary | ICD-10-CM | POA: Diagnosis present

## 2016-05-21 DIAGNOSIS — Z886 Allergy status to analgesic agent status: Secondary | ICD-10-CM

## 2016-05-21 DIAGNOSIS — I700 Atherosclerosis of aorta: Secondary | ICD-10-CM | POA: Diagnosis present

## 2016-05-21 DIAGNOSIS — R008 Other abnormalities of heart beat: Secondary | ICD-10-CM | POA: Diagnosis not present

## 2016-05-21 DIAGNOSIS — G629 Polyneuropathy, unspecified: Secondary | ICD-10-CM | POA: Diagnosis not present

## 2016-05-21 DIAGNOSIS — Z8249 Family history of ischemic heart disease and other diseases of the circulatory system: Secondary | ICD-10-CM | POA: Diagnosis not present

## 2016-05-21 DIAGNOSIS — E7800 Pure hypercholesterolemia, unspecified: Secondary | ICD-10-CM | POA: Diagnosis not present

## 2016-05-21 DIAGNOSIS — Z801 Family history of malignant neoplasm of trachea, bronchus and lung: Secondary | ICD-10-CM | POA: Diagnosis not present

## 2016-05-21 DIAGNOSIS — I509 Heart failure, unspecified: Secondary | ICD-10-CM | POA: Diagnosis not present

## 2016-05-21 DIAGNOSIS — K219 Gastro-esophageal reflux disease without esophagitis: Secondary | ICD-10-CM | POA: Diagnosis not present

## 2016-05-21 DIAGNOSIS — G43909 Migraine, unspecified, not intractable, without status migrainosus: Secondary | ICD-10-CM | POA: Diagnosis present

## 2016-05-21 DIAGNOSIS — Z87891 Personal history of nicotine dependence: Secondary | ICD-10-CM

## 2016-05-21 DIAGNOSIS — I110 Hypertensive heart disease with heart failure: Secondary | ICD-10-CM | POA: Diagnosis not present

## 2016-05-21 DIAGNOSIS — E1122 Type 2 diabetes mellitus with diabetic chronic kidney disease: Secondary | ICD-10-CM | POA: Diagnosis not present

## 2016-05-21 DIAGNOSIS — I129 Hypertensive chronic kidney disease with stage 1 through stage 4 chronic kidney disease, or unspecified chronic kidney disease: Secondary | ICD-10-CM | POA: Diagnosis not present

## 2016-05-21 DIAGNOSIS — Z794 Long term (current) use of insulin: Secondary | ICD-10-CM

## 2016-05-21 DIAGNOSIS — I2510 Atherosclerotic heart disease of native coronary artery without angina pectoris: Secondary | ICD-10-CM | POA: Diagnosis not present

## 2016-05-21 DIAGNOSIS — I130 Hypertensive heart and chronic kidney disease with heart failure and stage 1 through stage 4 chronic kidney disease, or unspecified chronic kidney disease: Secondary | ICD-10-CM | POA: Diagnosis not present

## 2016-05-21 DIAGNOSIS — F419 Anxiety disorder, unspecified: Secondary | ICD-10-CM | POA: Diagnosis not present

## 2016-05-21 DIAGNOSIS — Z79899 Other long term (current) drug therapy: Secondary | ICD-10-CM | POA: Diagnosis not present

## 2016-05-21 DIAGNOSIS — I6522 Occlusion and stenosis of left carotid artery: Principal | ICD-10-CM | POA: Diagnosis present

## 2016-05-21 DIAGNOSIS — Z951 Presence of aortocoronary bypass graft: Secondary | ICD-10-CM

## 2016-05-21 DIAGNOSIS — N183 Chronic kidney disease, stage 3 (moderate): Secondary | ICD-10-CM | POA: Diagnosis not present

## 2016-05-21 DIAGNOSIS — I6529 Occlusion and stenosis of unspecified carotid artery: Secondary | ICD-10-CM | POA: Diagnosis present

## 2016-05-21 DIAGNOSIS — Z7982 Long term (current) use of aspirin: Secondary | ICD-10-CM

## 2016-05-21 HISTORY — PX: ENDARTERECTOMY: SHX5162

## 2016-05-21 HISTORY — PX: PATCH ANGIOPLASTY: SHX6230

## 2016-05-21 LAB — MAGNESIUM: Magnesium: 2.3 mg/dL (ref 1.7–2.4)

## 2016-05-21 LAB — GLUCOSE, CAPILLARY
Glucose-Capillary: 153 mg/dL — ABNORMAL HIGH (ref 65–99)
Glucose-Capillary: 163 mg/dL — ABNORMAL HIGH (ref 65–99)
Glucose-Capillary: 183 mg/dL — ABNORMAL HIGH (ref 65–99)
Glucose-Capillary: 212 mg/dL — ABNORMAL HIGH (ref 65–99)

## 2016-05-21 SURGERY — ENDARTERECTOMY, CAROTID
Anesthesia: General | Site: Neck | Laterality: Left

## 2016-05-21 MED ORDER — HYDRALAZINE HCL 20 MG/ML IJ SOLN: 5.0000 mg | INTRAMUSCULAR | Status: DC | PRN

## 2016-05-21 MED ORDER — FUROSEMIDE 40 MG PO TABS
80.0000 mg | ORAL_TABLET | Freq: Every day | ORAL | Status: DC
  Filled 2016-05-21: qty 2

## 2016-05-21 MED ORDER — ONDANSETRON HCL 4 MG/2ML IJ SOLN
INTRAMUSCULAR | Status: AC
  Filled 2016-05-21: qty 2

## 2016-05-21 MED ORDER — LIDOCAINE HCL (CARDIAC) 20 MG/ML IV SOLN
INTRAVENOUS | Status: DC | PRN
  Administered 2016-05-21: 60 mg via INTRATRACHEAL
  Administered 2016-05-21: 20 mg via INTRATRACHEAL

## 2016-05-21 MED ORDER — SENNOSIDES-DOCUSATE SODIUM 8.6-50 MG PO TABS: 1.0000 | ORAL_TABLET | Freq: Every evening | ORAL | Status: DC | PRN

## 2016-05-21 MED ORDER — NEOSTIGMINE METHYLSULFATE 10 MG/10ML IV SOLN
INTRAVENOUS | Status: DC | PRN
  Administered 2016-05-21: 4 mg via INTRAVENOUS

## 2016-05-21 MED ORDER — MORPHINE SULFATE (PF) 2 MG/ML IV SOLN: 2.0000 mg | INTRAVENOUS | Status: DC | PRN

## 2016-05-21 MED ORDER — LABETALOL HCL 5 MG/ML IV SOLN: 10.0000 mg | INTRAVENOUS | Status: DC | PRN

## 2016-05-21 MED ORDER — HYDRALAZINE HCL 50 MG PO TABS
100.0000 mg | ORAL_TABLET | Freq: Two times a day (BID) | ORAL | Status: DC
  Administered 2016-05-21: 100 mg via ORAL
  Filled 2016-05-21 (×2): qty 2

## 2016-05-21 MED ORDER — HEPARIN SODIUM (PORCINE) 1000 UNIT/ML IJ SOLN
INTRAMUSCULAR | Status: AC
  Filled 2016-05-21: qty 1

## 2016-05-21 MED ORDER — HEPARIN SODIUM (PORCINE) 1000 UNIT/ML IJ SOLN
INTRAMUSCULAR | Status: DC | PRN
  Administered 2016-05-21: 9000 [IU] via INTRAVENOUS

## 2016-05-21 MED ORDER — DEXTROSE 5 % IV SOLN
1.5000 g | Freq: Two times a day (BID) | INTRAVENOUS | Status: DC
  Administered 2016-05-21: 1.5 g via INTRAVENOUS
  Filled 2016-05-21 (×2): qty 1.5

## 2016-05-21 MED ORDER — TOPIRAMATE 25 MG PO TABS
25.0000 mg | ORAL_TABLET | Freq: Two times a day (BID) | ORAL | Status: DC
  Filled 2016-05-21 (×2): qty 1

## 2016-05-21 MED ORDER — LIDOCAINE 2% (20 MG/ML) 5 ML SYRINGE
INTRAMUSCULAR | Status: AC
  Filled 2016-05-21: qty 10

## 2016-05-21 MED ORDER — FENOFIBRATE 160 MG PO TABS
160.0000 mg | ORAL_TABLET | Freq: Every day | ORAL | Status: DC
  Filled 2016-05-21: qty 1

## 2016-05-21 MED ORDER — DOCUSATE SODIUM 100 MG PO CAPS
100.0000 mg | ORAL_CAPSULE | Freq: Every day | ORAL | Status: DC
  Filled 2016-05-21: qty 1

## 2016-05-21 MED ORDER — ISOSORBIDE MONONITRATE ER 60 MG PO TB24
60.0000 mg | ORAL_TABLET | Freq: Every day | ORAL | Status: DC
  Filled 2016-05-21: qty 1

## 2016-05-21 MED ORDER — ALUM & MAG HYDROXIDE-SIMETH 200-200-20 MG/5ML PO SUSP: 15.0000 mL | ORAL | Status: DC | PRN

## 2016-05-21 MED ORDER — ROSUVASTATIN CALCIUM 10 MG PO TABS
10.0000 mg | ORAL_TABLET | Freq: Every day | ORAL | Status: DC
  Filled 2016-05-21 (×2): qty 1

## 2016-05-21 MED ORDER — FENTANYL CITRATE (PF) 100 MCG/2ML IJ SOLN
25.0000 ug | INTRAMUSCULAR | Status: DC | PRN
  Administered 2016-05-21 (×4): 25 ug via INTRAVENOUS

## 2016-05-21 MED ORDER — CLOPIDOGREL BISULFATE 75 MG PO TABS
75.0000 mg | ORAL_TABLET | Freq: Every day | ORAL | Status: DC
  Filled 2016-05-21: qty 1

## 2016-05-21 MED ORDER — SODIUM CHLORIDE 0.9 % IV SOLN: INTRAVENOUS | Status: DC

## 2016-05-21 MED ORDER — PROPOFOL 10 MG/ML IV BOLUS
INTRAVENOUS | Status: AC
  Filled 2016-05-21: qty 20

## 2016-05-21 MED ORDER — OXYCODONE-ACETAMINOPHEN 5-325 MG PO TABS
1.0000 | ORAL_TABLET | ORAL | Status: DC | PRN
  Filled 2016-05-21: qty 2

## 2016-05-21 MED ORDER — ROCURONIUM BROMIDE 100 MG/10ML IV SOLN
INTRAVENOUS | Status: DC | PRN
  Administered 2016-05-21: 50 mg via INTRAVENOUS
  Administered 2016-05-21: 20 mg via INTRAVENOUS

## 2016-05-21 MED ORDER — DEXAMETHASONE SODIUM PHOSPHATE 10 MG/ML IJ SOLN
INTRAMUSCULAR | Status: AC
  Filled 2016-05-21: qty 1

## 2016-05-21 MED ORDER — INSULIN LISPRO 100 UNIT/ML (KWIKPEN): 10.0000 [IU] | PEN_INJECTOR | Freq: Three times a day (TID) | SUBCUTANEOUS | Status: DC

## 2016-05-21 MED ORDER — PROMETHAZINE HCL 25 MG/ML IJ SOLN: 6.2500 mg | INTRAMUSCULAR | Status: DC | PRN

## 2016-05-21 MED ORDER — INSULIN ASPART 100 UNIT/ML ~~LOC~~ SOLN: 15.0000 [IU] | Freq: Two times a day (BID) | SUBCUTANEOUS | Status: DC

## 2016-05-21 MED ORDER — ACETAMINOPHEN 325 MG PO TABS
650.0000 mg | ORAL_TABLET | ORAL | Status: DC | PRN
  Administered 2016-05-21: 650 mg via ORAL
  Filled 2016-05-21: qty 2

## 2016-05-21 MED ORDER — 0.9 % SODIUM CHLORIDE (POUR BTL) OPTIME
TOPICAL | Status: DC | PRN
  Administered 2016-05-21: 2500 mL

## 2016-05-21 MED ORDER — SODIUM CHLORIDE 0.9 % IV SOLN: 500.0000 mL | Freq: Once | INTRAVENOUS | Status: DC | PRN

## 2016-05-21 MED ORDER — INSULIN GLARGINE 100 UNIT/ML ~~LOC~~ SOLN
40.0000 [IU] | SUBCUTANEOUS | Status: DC
  Filled 2016-05-21: qty 0.4

## 2016-05-21 MED ORDER — MAGNESIUM SULFATE 2 GM/50ML IV SOLN: 2.0000 g | Freq: Every day | INTRAVENOUS | Status: DC | PRN

## 2016-05-21 MED ORDER — INSULIN ASPART 100 UNIT/ML ~~LOC~~ SOLN: 10.0000 [IU] | Freq: Every day | SUBCUTANEOUS | Status: DC

## 2016-05-21 MED ORDER — PANTOPRAZOLE SODIUM 40 MG PO TBEC
40.0000 mg | DELAYED_RELEASE_TABLET | Freq: Every day | ORAL | Status: DC
  Filled 2016-05-21 (×2): qty 1

## 2016-05-21 MED ORDER — ONDANSETRON HCL 4 MG/2ML IJ SOLN
INTRAMUSCULAR | Status: DC | PRN
  Administered 2016-05-21: 4 mg via INTRAVENOUS

## 2016-05-21 MED ORDER — MIDAZOLAM HCL 2 MG/2ML IJ SOLN
INTRAMUSCULAR | Status: AC
  Administered 2016-05-21: 2 mg via INTRAVENOUS
  Filled 2016-05-21: qty 2

## 2016-05-21 MED ORDER — BISACODYL 5 MG PO TBEC: 5.0000 mg | DELAYED_RELEASE_TABLET | Freq: Every day | ORAL | Status: DC | PRN

## 2016-05-21 MED ORDER — FENTANYL CITRATE (PF) 100 MCG/2ML IJ SOLN
INTRAMUSCULAR | Status: AC
  Administered 2016-05-21: 25 ug via INTRAVENOUS
  Filled 2016-05-21: qty 2

## 2016-05-21 MED ORDER — CARVEDILOL 12.5 MG PO TABS
12.5000 mg | ORAL_TABLET | Freq: Two times a day (BID) | ORAL | Status: DC
  Administered 2016-05-21: 12.5 mg via ORAL
  Filled 2016-05-21 (×2): qty 1

## 2016-05-21 MED ORDER — CHLORHEXIDINE GLUCONATE 4 % EX LIQD: 60.0000 mL | Freq: Once | CUTANEOUS | Status: DC

## 2016-05-21 MED ORDER — PROTAMINE SULFATE 10 MG/ML IV SOLN
INTRAVENOUS | Status: DC | PRN
  Administered 2016-05-21: 10 mg via INTRAVENOUS
  Administered 2016-05-21: 20 mg via INTRAVENOUS
  Administered 2016-05-21 (×2): 10 mg via INTRAVENOUS

## 2016-05-21 MED ORDER — HEMOSTATIC AGENTS (NO CHARGE) OPTIME
TOPICAL | Status: DC | PRN
  Administered 2016-05-21: 1 via TOPICAL

## 2016-05-21 MED ORDER — ASPIRIN EC 81 MG PO TBEC: 81.0000 mg | DELAYED_RELEASE_TABLET | ORAL | Status: DC

## 2016-05-21 MED ORDER — FENTANYL CITRATE (PF) 250 MCG/5ML IJ SOLN
INTRAMUSCULAR | Status: AC
  Filled 2016-05-21: qty 5

## 2016-05-21 MED ORDER — SODIUM CHLORIDE 0.9 % IV SOLN
INTRAVENOUS | Status: DC | PRN
  Administered 2016-05-21: 500 mL

## 2016-05-21 MED ORDER — FENTANYL CITRATE (PF) 100 MCG/2ML IJ SOLN
INTRAMUSCULAR | Status: DC | PRN
  Administered 2016-05-21 (×5): 50 ug via INTRAVENOUS

## 2016-05-21 MED ORDER — MIDAZOLAM HCL 2 MG/2ML IJ SOLN
0.5000 mg | Freq: Once | INTRAMUSCULAR | Status: AC | PRN
  Administered 2016-05-21: 2 mg via INTRAVENOUS

## 2016-05-21 MED ORDER — ENOXAPARIN SODIUM 30 MG/0.3ML ~~LOC~~ SOLN
30.0000 mg | SUBCUTANEOUS | Status: DC
  Filled 2016-05-21: qty 0.3

## 2016-05-21 MED ORDER — ONDANSETRON HCL 4 MG/2ML IJ SOLN: 4.0000 mg | Freq: Four times a day (QID) | INTRAMUSCULAR | Status: DC | PRN

## 2016-05-21 MED ORDER — LIDOCAINE HCL 4 % EX SOLN
CUTANEOUS | Status: DC | PRN
  Administered 2016-05-21: 3 mL via TOPICAL

## 2016-05-21 MED ORDER — GLYCOPYRROLATE 0.2 MG/ML IJ SOLN
INTRAMUSCULAR | Status: DC | PRN
  Administered 2016-05-21: 0.6 mg via INTRAVENOUS

## 2016-05-21 MED ORDER — SODIUM CHLORIDE 0.9 % IV SOLN
INTRAVENOUS | Status: DC
  Administered 2016-05-21: 08:00:00 via INTRAVENOUS

## 2016-05-21 MED ORDER — MEPERIDINE HCL 25 MG/ML IJ SOLN: 6.2500 mg | INTRAMUSCULAR | Status: DC | PRN

## 2016-05-21 MED ORDER — PHENOL 1.4 % MT LIQD: 1.0000 | OROMUCOSAL | Status: DC | PRN

## 2016-05-21 MED ORDER — GUAIFENESIN-DM 100-10 MG/5ML PO SYRP: 15.0000 mL | ORAL_SOLUTION | ORAL | Status: DC | PRN

## 2016-05-21 MED ORDER — DEXTROSE 5 % IV SOLN
1.5000 g | INTRAVENOUS | Status: AC
  Administered 2016-05-21: 1.5 g via INTRAVENOUS

## 2016-05-21 MED ORDER — NEOSTIGMINE METHYLSULFATE 5 MG/5ML IV SOSY
PREFILLED_SYRINGE | INTRAVENOUS | Status: AC
  Filled 2016-05-21: qty 5

## 2016-05-21 MED ORDER — GLYCOPYRROLATE 0.2 MG/ML IV SOSY
PREFILLED_SYRINGE | INTRAVENOUS | Status: AC
Start: 2016-05-21 — End: 2016-05-21
  Filled 2016-05-21: qty 6

## 2016-05-21 MED ORDER — DIPHENHYDRAMINE HCL 25 MG PO CAPS: 50.0000 mg | ORAL_CAPSULE | Freq: Every evening | ORAL | Status: DC | PRN

## 2016-05-21 MED ORDER — PROPOFOL 10 MG/ML IV BOLUS
INTRAVENOUS | Status: DC | PRN
  Administered 2016-05-21: 80 mg via INTRAVENOUS

## 2016-05-21 MED ORDER — EPHEDRINE SULFATE 50 MG/ML IJ SOLN
INTRAMUSCULAR | Status: DC | PRN
  Administered 2016-05-21: 10 mg via INTRAVENOUS

## 2016-05-21 MED ORDER — SODIUM CHLORIDE 0.9 % IV SOLN
INTRAVENOUS | Status: DC | PRN
  Administered 2016-05-21: 08:00:00 via INTRAVENOUS

## 2016-05-21 MED ORDER — AMLODIPINE BESYLATE 10 MG PO TABS
10.0000 mg | ORAL_TABLET | Freq: Every day | ORAL | Status: DC
  Filled 2016-05-21: qty 1

## 2016-05-21 MED ORDER — DEXTROSE 5 % IV SOLN
INTRAVENOUS | Status: AC
  Filled 2016-05-21: qty 1.5

## 2016-05-21 MED ORDER — DEXAMETHASONE SODIUM PHOSPHATE 10 MG/ML IJ SOLN
INTRAMUSCULAR | Status: DC | PRN
  Administered 2016-05-21: 5 mg via INTRAVENOUS
  Administered 2016-05-21: 10 mg via INTRAVENOUS

## 2016-05-21 MED ORDER — SODIUM CHLORIDE 0.9 % IV SOLN
0.0125 ug/kg/min | INTRAVENOUS | Status: AC
  Administered 2016-05-21: .1 ug/kg/min via INTRAVENOUS
  Filled 2016-05-21: qty 2000

## 2016-05-21 MED ORDER — ICOSAPENT ETHYL 1 G PO CAPS: 2.0000 g | ORAL_CAPSULE | Freq: Two times a day (BID) | ORAL | Status: DC

## 2016-05-21 MED ORDER — ROCURONIUM BROMIDE 10 MG/ML (PF) SYRINGE
PREFILLED_SYRINGE | INTRAVENOUS | Status: AC
  Filled 2016-05-21: qty 10

## 2016-05-21 MED ORDER — OMEGA-3-ACID ETHYL ESTERS 1 G PO CAPS
2.0000 g | ORAL_CAPSULE | Freq: Two times a day (BID) | ORAL | Status: DC
  Filled 2016-05-21: qty 2

## 2016-05-21 MED ORDER — POTASSIUM CHLORIDE CRYS ER 20 MEQ PO TBCR: 20.0000 meq | EXTENDED_RELEASE_TABLET | Freq: Every day | ORAL | Status: DC | PRN

## 2016-05-21 MED ORDER — INSULIN GLARGINE 100 UNIT/ML ~~LOC~~ SOLN
80.0000 [IU] | Freq: Every day | SUBCUTANEOUS | Status: DC
  Filled 2016-05-21: qty 0.8

## 2016-05-21 MED ORDER — LIDOCAINE HCL (PF) 1 % IJ SOLN
INTRAMUSCULAR | Status: AC
  Filled 2016-05-21: qty 30

## 2016-05-21 MED ORDER — PHENYLEPHRINE HCL 10 MG/ML IJ SOLN
INTRAVENOUS | Status: DC | PRN
  Administered 2016-05-21 (×2): 15 ug/min via INTRAVENOUS

## 2016-05-21 MED ORDER — METOPROLOL TARTRATE 5 MG/5ML IV SOLN: 2.0000 mg | INTRAVENOUS | Status: DC | PRN

## 2016-05-21 SURGICAL SUPPLY — 51 items
CANISTER SUCTION 2500CC (MISCELLANEOUS) ×2 IMPLANT
CANNULA VESSEL 3MM 2 BLNT TIP (CANNULA) ×4 IMPLANT
CATH ROBINSON RED A/P 18FR (CATHETERS) ×2 IMPLANT
CLIP TI MEDIUM 6 (CLIP) ×2 IMPLANT
CLIP TI WIDE RED SMALL 6 (CLIP) ×2 IMPLANT
CRADLE DONUT ADULT HEAD (MISCELLANEOUS) ×2 IMPLANT
DECANTER SPIKE VIAL GLASS SM (MISCELLANEOUS) IMPLANT
DERMABOND ADVANCED (GAUZE/BANDAGES/DRESSINGS) ×1
DERMABOND ADVANCED .7 DNX12 (GAUZE/BANDAGES/DRESSINGS) ×1 IMPLANT
DRAIN HEMOVAC 1/8 X 5 (WOUND CARE) IMPLANT
ELECT REM PT RETURN 9FT ADLT (ELECTROSURGICAL) ×2
ELECTRODE REM PT RTRN 9FT ADLT (ELECTROSURGICAL) ×1 IMPLANT
EVACUATOR SILICONE 100CC (DRAIN) IMPLANT
GLOVE BIO SURGEON STRL SZ7.5 (GLOVE) ×2 IMPLANT
GLOVE BIOGEL PI IND STRL 6.5 (GLOVE) ×1 IMPLANT
GLOVE BIOGEL PI IND STRL 7.5 (GLOVE) ×1 IMPLANT
GLOVE BIOGEL PI INDICATOR 6.5 (GLOVE) ×1
GLOVE BIOGEL PI INDICATOR 7.5 (GLOVE) ×1
GLOVE ECLIPSE 6.5 STRL STRAW (GLOVE) ×4 IMPLANT
GLOVE ECLIPSE 7.0 STRL STRAW (GLOVE) ×2 IMPLANT
GLOVE ECLIPSE 7.5 STRL STRAW (GLOVE) ×2 IMPLANT
GOWN STRL REUS W/ TWL LRG LVL3 (GOWN DISPOSABLE) ×3 IMPLANT
GOWN STRL REUS W/ TWL XL LVL3 (GOWN DISPOSABLE) ×1 IMPLANT
GOWN STRL REUS W/TWL LRG LVL3 (GOWN DISPOSABLE) ×3
GOWN STRL REUS W/TWL XL LVL3 (GOWN DISPOSABLE) ×1
HEMOSTAT SPONGE AVITENE ULTRA (HEMOSTASIS) ×2 IMPLANT
KIT BASIN OR (CUSTOM PROCEDURE TRAY) ×2 IMPLANT
KIT ROOM TURNOVER OR (KITS) ×2 IMPLANT
LOOP VESSEL MINI RED (MISCELLANEOUS) IMPLANT
NEEDLE HYPO 25GX1X1/2 BEV (NEEDLE) IMPLANT
NS IRRIG 1000ML POUR BTL (IV SOLUTION) ×4 IMPLANT
PACK CAROTID (CUSTOM PROCEDURE TRAY) ×2 IMPLANT
PAD ARMBOARD 7.5X6 YLW CONV (MISCELLANEOUS) ×4 IMPLANT
PATCH HEMASHIELD 8X75 (Vascular Products) ×2 IMPLANT
PATCH VASC XENOSURE 1CMX6CM (Vascular Products) IMPLANT
PATCH VASC XENOSURE 1X6 (Vascular Products) IMPLANT
SHUNT CAROTID BYPASS 10 (VASCULAR PRODUCTS) ×2 IMPLANT
SHUNT CAROTID BYPASS 12FRX15.5 (VASCULAR PRODUCTS) IMPLANT
SUT ETHILON 3 0 PS 1 (SUTURE) IMPLANT
SUT PROLENE 6 0 CC (SUTURE) ×8 IMPLANT
SUT PROLENE 7 0 BV 1 (SUTURE) IMPLANT
SUT PROLENE 7 0 BV1 MDA (SUTURE) IMPLANT
SUT SILK 3 0 (SUTURE) ×1
SUT SILK 3 0 TIES 17X18 (SUTURE)
SUT SILK 3-0 18XBRD TIE 12 (SUTURE) ×1 IMPLANT
SUT SILK 3-0 18XBRD TIE BLK (SUTURE) IMPLANT
SUT VIC AB 3-0 SH 27 (SUTURE) ×1
SUT VIC AB 3-0 SH 27X BRD (SUTURE) ×1 IMPLANT
SUT VICRYL 4-0 PS2 18IN ABS (SUTURE) ×2 IMPLANT
SYR CONTROL 10ML LL (SYRINGE) IMPLANT
WATER STERILE IRR 1000ML POUR (IV SOLUTION) ×2 IMPLANT

## 2016-05-21 NOTE — H&P (View-Only) (Signed)
[facility] [place] HISTORY AND PHYSICAL     History of Present Illness:  Patient is a 70 y.o. male who presents for evaluation of asymptomatic carotid stenosis and bilateral lower extremity claudication.  His carotid stenosis was originally detected on workup for coronary bypass grafting. He denies any symptoms of TIA amaurosis or stroke.   He also has bilateral lower extremity claudication. He is currently walking 30 minutes daily in the morning to try to improve his walking distance.  He experiences claudication after walking on a treadmill at 2 miles per hour for 9 minutes. He rests for about 30 seconds and is able to get up to a total time of 35 minutes. 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 He is able to walk about 1 block prior to experiencing symptoms. He denies rest pain. He does have some numbness and tingling in his feet secondary to neuropathy.   Other medical problems include hypertension, diabetes, coronary disease, elevated cholesterol, migraine, diabetic retinopathy. These are all currently stable. He also has an element of renal dysfunction and has seen Dr. Moshe Cipro. He states his baseline creatinine is around 2.  He is on Plavix and aspirin and statin. He takes his aspirin every other day to try to reduce bleeding episodes to his eye. He also complains of intermittent swelling in both legs left greater than right this is somewhat improved by mild compression stockings.          Past Medical History    Diagnosis   Date    .   CAD S/P percutaneous coronary angioplasty   06/12/2011; 09/2104        a) 2012: Complex PCI mRCA ([facility]) --  2 overlapping Promus element DES stents.; 09/2014:  ost-prox SVG-rPDA - Xience DES 3.0 mm x 38 mm; (3.4  - 3.3 mm)    .   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  Likely thromboembolic event with PDA occlusion.   06/12/2011        Post Complex PCI    .   S/P CABG x 3   07/01/2013        a. 07/01/2013 (Cath for Crescendo Unstable Angina) --> [name]: For LM disease; LIMA-LAD, SVG-RCA, SCG-Cx; b. 09/2014 Cath/Staged PCI: patent LIMA->LAD, VG->OM1/RI, VG->RCA 80% (3.0x38 Xience Alpine DES on 10/12/2014).    Marland Kitchen   CAD (coronary artery disease), autologous vein bypass graft   09/2014        10/03/14: Cath for Class III-IV Angina -- 80% ostial-proximal SVG-RPDA (minimal flow down native PDA from native RCA with 50% proximal and distal, patent LIMA-LAD, SVG-OM with retrograde filling of OM 2. --> 10/12/14: Staged PCI of SVG-RPDA - Xience DES  3.0 mm x 38 mm; (3.4  - 3.3 mm)    .   Aortic sclerosis  - without Stenosis    March 2014        Echo: Aortic sclerosis without stenosis. EF 55-60% with normal WM; mild concentric hypertrophy with normal relaxation.    Marland Kitchen   PAD (peripheral artery disease)          with claudication    .   High cholesterol      .   Hypertension      .   GERD (gastroesophageal reflux disease)      .  Anxiety      .   Complication of anesthesia          "I've had name recall recognition problems since my last anesthesia"    .   Heart murmur      .   Childhood asthma      .   DM (diabetes mellitus) type II uncontrolled with eye manifestation   2013        Diabetic retinopathy with retinal hemorrhages since;, recurrent in August 2014 treated with Avastin shots    .   Migraine   ~ 2004        "once"    .   CKD (chronic kidney disease), stage III          "mild; related to diabetes" (10/02/2014)    .   Anemia                Past Surgical History    Procedure   Laterality   Date    .   Tonsillectomy        .   Nm myoview ltd     January '13        Diaphragmatic attenuation versus inferior infarct. No ischemia. Normal EF normal wall motion.    Marland Kitchen   Nm myoview ltd     June 2014        no ischemia    .   Coronary angioplasty with stent  placement   Right   September 2012        2 overlapping Promus DES 2.7 mm at 12 mm x2; postdilated to 3 mm    .   Cardiac catheterization     06/28/2013        ostial LM disese, RCA ~40-50%ISR    .   Retinal laser procedure   Bilateral          "more than once"     .   Intraoperative transesophageal echocardiogram   N/A   07/01/2013        Procedure: INTRAOPERATIVE TRANSESOPHAGEAL ECHOCARDIOGRAM;  Surgeon: Grace Isaac, MD;  Location: [facility];  Service: Open Heart Surgery;  Laterality: N/A;    .   Transthoracic echocardiogram     06/29/2013        Focal basal hypertrophy with normal LV size. EF 60-65% no regional WMA. Mild LA dilation    .   Left heart catheterization with coronary angiogram   N/A   06/11/2011        Procedure: LEFT HEART CATHETERIZATION WITH CORONARY ANGIOGRAM;  Surgeon: Fulton Reek, MD;  Location: [facility] CATH LAB;  Service: Cardiovascular;  Laterality: N/A;    .   Percutaneous coronary stent intervention (pci-s)     06/11/2011        Procedure: PERCUTANEOUS CORONARY STENT INTERVENTION (PCI-S);  Surgeon: Fulton Reek, MD;  Location: [facility] CATH LAB;  Service: Cardiovascular;;    .   Left heart catheterization with coronary angiogram   N/A   06/28/2013        Procedure: LEFT HEART CATHETERIZATION WITH CORONARY ANGIOGRAM;  Surgeon: Leonie Man, MD;  Location: [facility] CATH LAB;  Service: Cardiovascular;  Laterality: N/A;    .   Coronary artery bypass graft   N/A   07/01/2013        Procedure: CORONARY ARTERY BYPASS GRAFTING (CABG);  Surgeon: Grace Isaac, MD;  Location: [place] [facility];  Service: Open Heart Surgery;  Laterality: N/A;  Times 3 using  left internal mammary artery and endoscopically harvested bilateral saphenous vein    .   Left and right heart catheterization with coronary/graft angiogram   N/A   10/03/2014        Procedure: LEFT AND RIGHT HEART CATHETERIZATION WITH Beatrix Fetters;  Surgeon: Leonie Man, MD;  Location: [facility] CATH LAB;  Service: Cardiovascular;   Laterality: N/A;    .   Percutaneous coronary stent intervention (pci-s)   N/A   10/12/2014        Procedure: PERCUTANEOUS CORONARY STENT INTERVENTION (PCI-S);  Surgeon: Leonie Man, MD;  Location: [facility] CATH LAB;  Ostial-Prox SVG-RCA Xience Alpine DES 3.0 mm x 38 mm; (3.4  - 3.3 mm)      History           Social History    .   Marital Status:   Married        Spouse Name:   N/A    .   Number of Children:   N/A    .   Years of Education:   N/A         Occupational History    .   Not on file.            Social History Main Topics    .   Smoking status:   Former Smoker -- 2.00 packs/day for 27 years        Types:   Cigarettes        Quit date:   11/23/1996    .   Smokeless tobacco:   Never Used    .   Alcohol Use:   0.0 oz/week          Comment: 10/02/2014  "some weeks I might have 2-3 drinks then go for a month and not have any"    .   Drug Use:   No    .   Sexual Activity:   Not Currently          Other Topics   Concern    .   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 He notably has not been exercising like he used to. He [place] He has social alcoholic beverages but is trying to cut that back and does not smoke.            He tells me he is a sucker for chips but does not eat sweets because of diabetes.        Family History       Family History     Problem    Relation    Age of Onset     .    Cancer    Sister           Lung cancer     .    Cancer    Mother       .    Cancer    Father       .    Hyperlipidemia    Father       .    Hypertension    Father       .    Cancer    Maternal Grandmother       .    Cancer  Paternal Grandfather           Allergies        Allergies     Allergen    Reactions     .    Atorvastatin    Other (See Comments)         Leg pain     .    Ibuprofen    Hives     .    Pravastatin        .    Simvastatin    Other (See Comments)         Leg pain             Current Outpatient Prescriptions on File Prior to Visit    Medication   Sig   Dispense   Refill    .   acetaminophen (TYLENOL) 325 MG tablet   Take 650 mg by mouth every 4 (four) hours as needed for mild pain.         Marland Kitchen   amLODipine (NORVASC) 10 MG tablet   TAKE 1 TABLET ONCE DAILY.   30 tablet   9    .   aspirin EC 81 MG tablet   Take 1 tablet (81 mg total) by mouth every other day.        .   B-D ULTRAFINE III SHORT PEN 31G X 8 MM MISC   See admin instructions.     0    .   carvedilol (COREG) 12.5 MG tablet   Take 1 tablet (12.5 mg total) by mouth 2 (two) times daily.   60 tablet   11    .   clopidogrel (PLAVIX) 75 MG tablet   Take 1 tablet (75 mg total) by mouth daily with breakfast.   30 tablet   11    .   CRESTOR 10 MG tablet   TAKE ONE TABLET EVERY OTHER DAY.   15 tablet   11    .   fenofibrate 160 MG tablet   Take 160 mg by mouth daily.        .   furosemide (LASIX) 40 MG tablet   Take 1 tablet (40 mg total) by mouth daily. (Patient taking differently: Take 40 mg by mouth 2 (two) times daily. )   30 tablet   11    .   HUMALOG KWIKPEN 100 UNIT/ML SOPN   Inject 10-15 Units into the skin 3 (three) times daily. Sliding scale. 10 units with breakfast and 15 units with lunch and supper        .   hydrALAZINE (APRESOLINE) 100 MG tablet   Take 1 tablet (100 mg total) by mouth 2 (two) times daily.   60 tablet   11    .   insulin glargine (LANTUS) 100 UNIT/ML injection   Inject 40-70 Units into the skin 2 (two) times daily. 40 units in am, 70 units in pm        .   isosorbide mononitrate (IMDUR) 60 MG 24 hr tablet   Take 1 tablet (60 mg total) by mouth daily.   30 tablet   11    .   NITROSTAT 0.4 MG SL tablet   DISSOLVE 1 TABLET UNDER TONGUE AS NEEDED FOR CHEST PAIN,MAY REPEAT IN5 MINUTES FOR 2 DOSES.   25 tablet   1    .   pantoprazole (PROTONIX) 40 MG tablet   Take 40 mg by mouth daily.  Current  Facility-Administered Medications on File Prior to Visit    Medication   Dose   Route   Frequency   Provider   Last Rate   Last Dose    .   0.9 %  sodium chloride infusion     Intravenous   Continuous   Leonie Man, MD            ROS:       Cardiac: He denies chest pain.   Respiratory: He denies shortness of breath except with severe exertion   Urinary: [x ] chronic Kidney disease, [ ]  on HD - [ ]  MWF or [ ]  TTHS, [ ]  Burning with urination, [ ]  Frequent urination, [ ]  Difficulty urinating;         Physical Examination    Vitals:   05/08/16 1640  BP: (!) 131/58  Pulse: (!) 56  Resp: 16  Temp: 97 F (36.1 C)  SpO2: 96%  Weight: 208 lb (94.3 kg)  Height: 5' 8.5" (1.74 m)   General:  Alert and oriented, no acute distress HEENT: Normal Neck: Soft left carotid bruit no right bruit Pulmonary: Clear to auscultation bilaterally Cardiac: Regular Rate and Rhythm without murmur Skin: No rash, no skin cracks or ulcers on the feet Extremity Pulses:  2+ radial, brachial, femoral pulses bilaterally absent popliteal and pedal pulses bilaterally Musculoskeletal: No deformity trace edema bilaterally left greater than right      Neurologic: Upper and lower extremity motor 5/5 and symmetric, no facial asymmetry   DATA:   He had a carotid duplex exam which showed a 60-80% left internal carotid artery stenosis peak systolic velocity 917 and diastolic 96 antegrade vertebral flow bilaterally.  Stenosis most likely underestimated due to severe calcification. Previously he also had bilateral lower extremity arterial duplex scan July 2017. This showed a patent but narrowed right superficial femoral artery in the proximal and distal segment. He also had a 50-75% stenosis of the left common femoral artery and a greater than 75% stenosis of the distal left superficial femoral artery I reviewed and interpreted this study.     ASSESSMENT:  #1 claudication lifestyle limiting but reluctant to proceed  with arteriogram secondary to renal dysfunction.antiplatelet and statin therapy.   #2     carotid occlusive disease Velocities in the left carotid had increased significantly. Most likely he has a high-grade stenosis on the left side. Lesion was calcified and most likely underestimated by duplex. I discussed with the patient today the possibility of left carotid endarterectomy for stroke prophylaxis. Risk of operation cranial nerve injury stroke risk bleeding infection perioperative risks were discussed with the patient. He is ready to proceed with operation. I discussed with him signs and symptoms of TIA amaurosis or stroke.     PLAN:    Repeat ABIs and arterial duplex in 6 months. Continue walking program and risk factor modification.  Left carotid endarterectomy scheduled for 05/21/2016. The patient will be cardiac risk stratified prior to this. I spoke with Dr. Ellyn Hack his cardiologist regarding this today.   Ruta Hinds, MD [facility] [place] Office: [phone] Pager: [phone]

## 2016-05-21 NOTE — Progress Notes (Signed)
Pt awake in PACU  Vitals:   05/21/16 1209 05/21/16 1210 05/21/16 1215 05/21/16 1242  BP:  (!) 120/49  (!) 135/46  Pulse:  66 62   Resp:  19 18   Temp: 98 F (36.7 C)     TempSrc:      SpO2:  96% 92% 93%    Left neck no hematoma Speech normal still a little sleepy Tongue midline Moving UE/LE 5/5  Stable post op  Ruta Hinds, MD [facility] [place] Office: [phone] Pager: [phone]

## 2016-05-21 NOTE — Op Note (Addendum)
Procedure Left carotid endarterectomy  Preoperative diagnosis: >80% asymptomatic left internal carotid artery stenosis  Postoperative diagnosis: Same  Anesthesia General  Asst.: Gerri Lins, PAC  Operative findings: #1 greater than 80% left internal carotid stenosis                                                             #2 Dacron patch           #3 10 Fr shunt                                #4 intraop bigeminy cardiac arrhythmia  Operative details: After obtaining informed consent, the patient was taken to the operating room. The patient was placed in a supine position on the operating room table. After induction of general anesthesia and endotracheal intubation a Foley catheter was placed. Next the patient's entire neck and chest was prepped and draped in the usual sterile fashion. An oblique incision was made on the left aspect of the patient's neck anterior to the border the left sternocleidomastoid muscle. The incision was carried into the subcutaneous tissues and through the platysma. The sternocleidomastoid muscle was identified and reflected laterally. The common carotid artery was then found at the base of the incision this was dissected free circumferentially. It was fairly soft on palpation.  The vagus nerve was identified and protected. Dissection was then carried up to the level carotid bifurcation.  The bifurcation was anatomically hight.  Several tethering vessels around the hypoglossal nerve were ligated and divided between silk ties in order to mobilize the nerve.  The internal carotid artery was dissected free circumferentially just below the digastric muscle.   The hyperglossal nerve was protected through there was some intermittent traction on it. The internal carotid artery was dissected free circumferentially just below the level of the hypoglossal nerve and it was soft in character at this location and above any palpable disease. A vessel loop was placed around this. The  vessel was fairly small. Next the external carotid and superior thyroid arteries were dissected free circumferentially and vessel loops were placed around these. The patient was given 8000 units of intravenous heparin.  After 2 minutes of circulation time and raising the mean arterial pressure to 90 mm mercury, the distal internal carotid artery was controlled with small bulldog clamp. The external carotid and superior thyroid arteries were controlled with vessel loops. The common carotid artery was controlled with a peripheral DeBakey clamp. A longitudinal opening was made in the common carotid artery just below the bifurcation. The arteriotomy was extended distally up into the internal carotid with Potts scissors. There was a large calcified plaque with greater than 80% stenosis in the internal carotid.  A 10 Fr shunt was brought onto the field and fashioned to fit the patient's artery.  This was threaded into the distal internal carotid artery and allowed to backbleed thoroughly.  There was pulsatile backbleeding.  This was then threaded into the common carotid and secured with a Rummel tourniquet.   There was no air at this point and flow was restored to the brain.  Attention was then turned to the common carotid artery once again. A suitable endarterectomy plane was obtained and endarterectomy was begun  in the common carotid artery and a good proximal endpoint was obtained. An eversion endarterectomy was performed on the external carotid artery and a good endpoint was obtained. The plaque was then elevated in the internal carotid artery and a nice feathered distal endpoint was also obtained.  The plaque was passed off the table. All loose debris was then removed from the carotid bed and everything was thoroughly irrigated with heparinized saline. A Dacron patch was then brought on to the operative field and this was sewn on as a patch angioplasty using a running 6-0 Prolene suture. Prior to completion of the  anastomosis the internal carotid artery was thoroughly backbled. This was then controlled again with a fine bulldog clamp.  The common carotid was thoroughly flushed forward. The external carotid was also thoroughly backbled.  The remainder of the patch was completed and the anastomosis was secured. Flow was then restored first retrograde from the external carotid into the carotid bed then antegrade from the common carotid to the external carotid artery and after approximately 5 cardiac cycles to the internal carotid artery. Doppler was used to evaluate the external/internal and common carotid arteries and these all had good Doppler flow. Hemostasis was obtained with 2 additional repair sutures along the medial distal end of the patch. The patient was also given 50 mg of Protamine.  The patient had some bigeminy intraop which had resolved by the end of the case.    The platysma muscle was reapproximated using a running 3-0 Vicryl suture. The skin was closed with 4 0 Vicryl subcuticular stitch.  The patient was awakened in the operating room and was moving upper and lower extremities symmetrically and following commands.  The patient was stable on arrival to the PACU.  Ruta Hinds, MD [facility] [place] Office: [phone] Pager: [phone]

## 2016-05-21 NOTE — Transfer of Care (Signed)
Immediate Anesthesia Transfer of Care Note  Patient: Lance Collier  Procedure(s) Performed: Procedure(s): LEFT CAROTID ENDARTERECTOMY (Left) PATCH ANGIOPLASTY USING HEMASHIELD PLATINUM FINESSE 0.3in x 3 in (Left)  Patient Location: PACU  Anesthesia Type:General  Level of Consciousness: awake, alert , oriented and patient cooperative  Airway & Oxygen Therapy: Patient Spontanous Breathing and Patient connected to nasal cannula oxygen  Post-op Assessment: Report given to RN and Post -op Vital signs reviewed and stable  Post vital signs: Reviewed and stable  Last Vitals:  Vitals:   05/21/16 0720  BP: (!) 179/50  Pulse: (!) 50  Resp: 18  Temp: 36.7 C    Last Pain:  Vitals:   05/21/16 0720  TempSrc: Oral         Complications: No apparent anesthesia complications

## 2016-05-21 NOTE — Progress Notes (Signed)
Swallow intact No hematoma Neuro intact  Plan for d/c home early tomorrow.  His wife will be here at 52 am  Ruta Hinds, MD [facility] [place]: [phone] Pager: [phone]

## 2016-05-21 NOTE — Interval H&P Note (Signed)
History and Physical Interval Note:  05/21/2016 8:30 AM  Lance Collier  has presented today for surgery, with the diagnosis of Left Internal Carotid Artery Stenosis I65.22   The various methods of treatment have been discussed with the patient and family. After consideration of risks, benefits and other options for treatment, the patient has consented to  Procedure(s): ENDARTERECTOMY CAROTID (Left) as a surgical intervention .  The patient's history has been reviewed, patient examined, no change in status, stable for surgery.  I have reviewed the patient's chart and labs.  Questions were answered to the patient's satisfaction.     Ruta Hinds

## 2016-05-21 NOTE — Anesthesia Postprocedure Evaluation (Signed)
Anesthesia Post Note  Patient: Lance Collier  Procedure(s) Performed: Procedure(s) (LRB): LEFT CAROTID ENDARTERECTOMY (Left) PATCH ANGIOPLASTY USING HEMASHIELD PLATINUM FINESSE 0.3in x 3 in (Left)  Patient location during evaluation: PACU Anesthesia Type: General Level of consciousness: awake and alert, oriented and patient cooperative Pain management: pain level controlled Vital Signs Assessment: post-procedure vital signs reviewed and stable Respiratory status: spontaneous breathing, nonlabored ventilation, respiratory function stable and patient connected to nasal cannula oxygen Cardiovascular status: blood pressure returned to baseline and stable Postop Assessment: no signs of nausea or vomiting Anesthetic complications: no    Last Vitals:  Vitals:   05/21/16 1500 05/21/16 1515  BP:    Pulse: (!) 58 (!) 57  Resp: 12 12  Temp:      Last Pain:  Vitals:   05/21/16 1300  TempSrc:   PainSc: 7                  Annell Canty,E. Tymere Depuy

## 2016-05-21 NOTE — OR Nursing (Signed)
Performed Neuro assessment in short stay prior to coming back to OR. Patient able follow commands by  sticking tongue out, wiggling toes on both feet, and squeezing equally with both hands.

## 2016-05-21 NOTE — Anesthesia Preprocedure Evaluation (Signed)
Anesthesia Evaluation  Patient identified by MRN, date of birth, ID band Patient awake    Reviewed: Allergy & Precautions, NPO status , Patient's Chart, lab work & pertinent test results  History of Anesthesia Complications Negative for: history of anesthetic complications  Airway Mallampati: II  TM Distance: >3 FB Neck ROM: Full    Dental  (+) Caps, Dental Advisory Given   Pulmonary COPD, former smoker,    breath sounds clear to auscultation       Cardiovascular hypertension, Pt. on medications and Pt. on home beta blockers (-) angina+ CAD, + Past MI, + Cardiac Stents and + Peripheral Vascular Disease   Rhythm:Regular Rate:Normal  '16 ECHO: EF 55-60%, aortic sclerosis, mild MR, mild-mod TR 05/14/16 Stress: normal perfusion, EF 55%   Neuro/Psych  Headaches, Anxiety    GI/Hepatic Neg liver ROS, GERD  Medicated and Controlled,  Endo/Other  diabetes, Insulin DependentMorbid obesity  Renal/GU Renal InsufficiencyRenal disease (creat 2.45)     Musculoskeletal   Abdominal (+) + obese,   Peds  Hematology   Anesthesia Other Findings   Reproductive/Obstetrics                             Anesthesia Physical Anesthesia Plan  ASA: III  Anesthesia Plan: General   Post-op Pain Management:    Induction: Intravenous  Airway Management Planned: Oral ETT  Additional Equipment: Arterial line  Intra-op Plan:   Post-operative Plan: Extubation in OR  Informed Consent: I have reviewed the patients History and Physical, chart, labs and discussed the procedure including the risks, benefits and alternatives for the proposed anesthesia with the patient or authorized representative who has indicated his/her understanding and acceptance.   Dental advisory given  Plan Discussed with: CRNA and Surgeon  Anesthesia Plan Comments: (Plan routine monitors, A line, GETA)        Anesthesia Quick  Evaluation

## 2016-05-22 ENCOUNTER — Encounter (HOSPITAL_COMMUNITY): Payer: Self-pay | Admitting: Vascular Surgery

## 2016-05-22 LAB — BASIC METABOLIC PANEL
ANION GAP: 8 (ref 5–15)
BUN: 34 mg/dL — ABNORMAL HIGH (ref 6–20)
CALCIUM: 8.5 mg/dL — AB (ref 8.9–10.3)
CO2: 21 mmol/L — ABNORMAL LOW (ref 22–32)
Chloride: 107 mmol/L (ref 101–111)
Creatinine, Ser: 1.99 mg/dL — ABNORMAL HIGH (ref 0.61–1.24)
GFR, EST AFRICAN AMERICAN: 37 mL/min — AB (ref >60)
GFR, EST NON AFRICAN AMERICAN: 32 mL/min — AB (ref >60)
GLUCOSE: 201 mg/dL — AB (ref 65–99)
Potassium: 4 mmol/L (ref 3.5–5.1)
SODIUM: 136 mmol/L (ref 135–145)

## 2016-05-22 LAB — HEMOGLOBIN A1C
Hgb A1c MFr Bld: 9.5 % — ABNORMAL HIGH (ref 4.8–5.6)
MEAN PLASMA GLUCOSE: 226 mg/dL

## 2016-05-22 LAB — CBC
HCT: 29.7 % — ABNORMAL LOW (ref 39.0–52.0)
Hemoglobin: 9.6 g/dL — ABNORMAL LOW (ref 13.0–17.0)
MCH: 27.7 pg (ref 26.0–34.0)
MCHC: 32.3 g/dL (ref 30.0–36.0)
MCV: 85.8 fL (ref 78.0–100.0)
PLATELETS: 221 10*3/uL (ref 150–400)
RBC: 3.46 MIL/uL — ABNORMAL LOW (ref 4.22–5.81)
RDW: 13.5 % (ref 11.5–15.5)
WBC: 9.1 10*3/uL (ref 4.0–10.5)

## 2016-05-22 LAB — GLUCOSE, CAPILLARY: GLUCOSE-CAPILLARY: 203 mg/dL — AB (ref 65–99)

## 2016-05-22 NOTE — Care Management Note (Signed)
Case Management Note  Patient Details  Name: Daymian W [name] MRN: [id number] Date of Birth: [birth year]  Subjective/Objective:    S/p L CEA, for dc to day, no needs.                Action/Plan:   Expected Discharge Date:                  Expected Discharge Plan:  Home/Self Care  In-House Referral:     Discharge planning Services  CM Consult  Post Acute Care Choice:    Choice offered to:     DME Arranged:    DME Agency:     HH Arranged:    HH Agency:     Status of Service:  Completed, signed off  If discussed at [employer] of Stay Meetings, dates discussed:    Additional Comments:  Zenon Mayo, RN 05/22/2016, 9:52 AM

## 2016-05-22 NOTE — Progress Notes (Signed)
Patient to be discharged to home with spouse. Patient has been voiding without difficulty, ate 50% of breakfast, and ambulated a lap around the unit. Neuro checks have been WNL. PIV x2 removed per protocol. Discharge instructions reviewed with patient and patient's spouse, including medications, care/monitoring his incision site, and appropriate precautions to follow. Answered patient and spouse's questions. Spouse to provide transportation home. All belongings returned.   Joellen Jersey, RN.

## 2016-05-22 NOTE — Progress Notes (Addendum)
[facility] [place]  Subjective  - Ready to go home.   Objective (!) 135/50 60 98.3 F (36.8 C) (Oral) 18 94%  Intake/Output Summary (Last 24 hours) at 05/22/16 0714 Last data filed at 05/22/16 0000  Gross per 24 hour  Intake          2440.42 ml  Output              625 ml  Net          1815.42 ml    No tongue deviation, smile is symmetric Grip 5/5 Left neck incision clean an dry without hematoma  Assessment/Planning: POD # 1 left CEA  Stable condition for d/c home   Laurence Slate [facility] 05/22/2016 7:14 AM -- Agree with above  D/c home  Ruta Hinds, MD [facility] [place]: [phone] Pager: [phone]  Laboratory Lab Results:  Recent Labs  05/19/16 1612 05/22/16 0548  WBC 6.1 9.1  HGB 12.3* 9.6*  HCT 38.1* 29.7*  PLT 299 221   BMET  Recent Labs  05/19/16 1612 05/22/16 0548  NA 137 136  K 4.0 4.0  CL 104 107  CO2 25 21*  GLUCOSE 138* 201*  BUN 37* 34*  CREATININE 2.45* 1.99*  CALCIUM 9.4 8.5*    COAG Lab Results  Component Value Date   INR 0.96 05/19/2016   INR 1.01 10/11/2014   INR 0.98 10/03/2014   No results found for: PTT

## 2016-05-26 LAB — POCT I-STAT 7, (LYTES, BLD GAS, ICA,H+H)
Acid-base deficit: 1 mmol/L (ref 0.0–2.0)
Bicarbonate: 24.8 mmol/L (ref 20.0–28.0)
Calcium, Ion: 1.17 mmol/L (ref 1.15–1.40)
HEMATOCRIT: 31 % — AB (ref 39.0–52.0)
HEMOGLOBIN: 10.5 g/dL — AB (ref 13.0–17.0)
O2 SAT: 100 %
PH ART: 7.37 (ref 7.350–7.450)
POTASSIUM: 4.1 mmol/L (ref 3.5–5.1)
SODIUM: 141 mmol/L (ref 135–145)
TCO2: 26 mmol/L (ref 0–100)
pCO2 arterial: 42.9 mmHg (ref 32.0–48.0)
pO2, Arterial: 210 mmHg — ABNORMAL HIGH (ref 83.0–108.0)

## 2016-05-27 NOTE — Discharge Summary (Signed)
[facility] Discharge Summary   Patient ID:  Lance Collier MRN: [id number] DOB/AGE: 07-19-1945 70 y.o.  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 Anemia   . Anxiety   . Aortic sclerosis  - without Stenosis  March 2014   Echo: Aortic sclerosis without stenosis. EF 55-60% with normal WM; mild concentric hypertrophy with normal relaxation.  Marland Kitchen CAD (coronary artery disease), autologous vein bypass graft 09/2014   10/03/14: Cath for Class III-IV Angina -- 80% ostial-proximal SVG-RPDA (minimal flow down native PDA from native RCA with 50% proximal and distal, patent LIMA-LAD, SVG-OM with retrograde filling of OM 2. --> 10/12/14: Staged PCI of SVG-RPDA - Xience DES  3.0 mm x 38 mm; (3.4  - 3.3 mm)  . CAD S/P percutaneous coronary angioplasty 06/12/2011; 09/2104   a) 2012: Complex PCI mRCA ([place]) --  2 overlapping Promus element DES stents.; 09/2014:  ost-prox SVG-rPDA - Xience DES 3.0 mm x 38 mm; (3.4  - 3.3 mm)  . CHF (congestive heart failure) ([place])   . Childhood asthma   . CKD (chronic kidney disease), stage III    "mild; related to diabetes" (10/02/2014)  . Complication of anesthesia    "I've had name recall recognition problems since my last anesthesia"  . DM (diabetes mellitus) type II uncontrolled with eye manifestation ([place]) 2013   Diabetic retinopathy with retinal hemorrhages since;, recurrent in August 2014 treated with Avastin shots  . Dyspnea    mainly with exertion  . GERD (gastroesophageal reflux disease)   . Heart murmur   . High cholesterol   . Hypertension   . Migraine ~ 2004   "once"  . PAD (peripheral artery disease) ([facility])    with claudication  . PONV (postoperative  nausea and vomiting)    s/p tonsillectomy  . 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 07/01/2013 (Cath for [place] Unstable Angina) --> [name]: For LM disease; LIMA-LAD, SVG-RCA, SCG-Cx; b. 09/2014 Cath/Staged PCI: patent LIMA->LAD, VG->OM1/RI, VG->RCA 80% (3.0x38 Xience Alpine DES on 10/12/2014).  . 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  Likely thromboembolic event with PDA occlusion. 06/12/2011   Post Complex PCI    Procedure(s): LEFT CAROTID ENDARTERECTOMY PATCH ANGIOPLASTY USING HEMASHIELD PLATINUM FINESSE 0.3in x 3 in  Discharged Condition: good  HPI: Patient is a 70 y.o. malewho presents for evaluation of asymptomatic carotid stenosis and bilateral lower extremity claudication.His carotid stenosis was originally detected on workup for coronary bypass grafting. He denies any symptoms of TIA amaurosis or stroke.   He also has bilateral lower extremity claudication. He is currently walking 30 minutes daily in the morning to try to improve his walking distance.He experiences claudication after walking on a treadmill at 2 miles per hour for 9 minutes. He rests for about 30 seconds and is able to get up to a total time of 35 minutes. 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 He is able to walk about 1 block prior to experiencing symptoms. He denies rest pain. He does have some numbness and tingling in his feet secondary to neuropathy.  Other medical problems include hypertension, diabetes, coronary disease, elevated cholesterol, migraine, diabetic retinopathy. These are all currently stable. He also has an element of renal dysfunction and has seen Dr. Moshe Cipro. He states his baseline creatinine is around 2.  He is on Plavix and aspirin and statin. He takes his aspirin every other day to try to reduce bleeding episodes to his eye. He  also complains of intermittent swelling in both legs left greater than right this is somewhat improved by mild compression stockings.    Hospital Course:  Lance Collier is a 70 y.o. male is S/P Procedure(s): LEFT CAROTID ENDARTERECTOMY PATCH ANGIOPLASTY USING HEMASHIELD PLATINUM FINESSE 0.3in x 3 in  No tongue deviation, smile is symmetric Grip 5/5 Left neck incision clean an dry without hematoma  Assessment/Planning: POD # 1 left CEA  Stable condition for d/c home   Significant Diagnostic Studies: CBC Lab Results  Component Value Date   WBC 9.1 05/22/2016   HGB 9.6 (L) 05/22/2016   HCT 29.7 (L) 05/22/2016   MCV 85.8 05/22/2016   PLT 221 05/22/2016    BMET    Component Value Date/Time   NA 136 05/22/2016 0548   K 4.0 05/22/2016 0548   CL 107 05/22/2016 0548   CO2 21 (L) 05/22/2016 0548   GLUCOSE 201 (H) 05/22/2016 0548   BUN 34 (H) 05/22/2016 0548   CREATININE 1.99 (H) 05/22/2016 0548   CREATININE 1.72 (H) 11/03/2014 0958   CALCIUM 8.5 (L) 05/22/2016 0548   GFRNONAA 32 (L) 05/22/2016 0548   GFRNONAA 39 (L) 07/20/2013 1120   GFRAA 37 (L) 05/22/2016 0548   GFRAA 46 (L) 07/20/2013 1120   COAG Lab Results  Component Value Date   INR 0.96 05/19/2016   INR 1.01 10/11/2014   INR 0.98 10/03/2014     Disposition:  Discharge to :Home Discharge Instructions    Call MD for:  redness, tenderness, or signs of infection (pain, swelling, bleeding, redness, odor or green/yellow discharge around incision site)    Complete by:  As directed    Call MD for:  severe or increased pain, loss or decreased feeling  in affected limb(s)    Complete by:  As directed    Call MD for:  temperature >100.5    Complete by:  As directed    Discharge instructions    Complete by:  As directed    You may shower daily   Driving Restrictions    Complete by:  As directed    No driving for 1 week   Lifting restrictions    Complete by:  As directed    No heavy lifting for 3-4 weeks    Resume previous diet    Complete by:  As directed        Medication List    TAKE these medications   acetaminophen 325 MG tablet Commonly known as:  TYLENOL Take 650 mg by mouth every 4 (four) hours as needed for mild pain. Reported on 01/03/2016   amLODipine 10 MG tablet Commonly known as:  NORVASC TAKE 1 TABLET ONCE DAILY.   aspirin EC 81 MG tablet Take 1 tablet (81 mg total) by mouth every other day.   B-D ULTRAFINE III SHORT PEN 31G X 8 MM Misc Generic drug:  Insulin Pen Needle See admin instructions.   carvedilol 12.5 MG tablet Commonly known as:  COREG TAKE 1 TABLET TWICE DAILY.   clopidogrel 75 MG tablet Commonly known as:  PLAVIX Take 1 tablet (75 mg total)  by mouth daily with breakfast.   fenofibrate 160 MG tablet Take 160 mg by mouth daily.   furosemide 40 MG tablet Commonly known as:  LASIX Take 40-80 mg by mouth 2 (two) times daily. Take 2 tabs in the morning and 1 tab in the evening   HUMALOG KWIKPEN 100 UNIT/ML KiwkPen Generic drug:  insulin lispro Inject 10-15 Units into the skin 3 (three) times daily. Sliding scale. 10 units with breakfast and 15 units with lunch & 15 unit with supper   hydrALAZINE 100 MG tablet Commonly known as:  APRESOLINE TAKE 1 TABLET TWICE DAILY.   insulin glargine 100 UNIT/ML injection Commonly known as:  LANTUS Inject 40-80 Units into the skin 2 (two) times daily. 40 units in am, 80 units in pm   isosorbide mononitrate 60 MG 24 hr tablet Commonly known as:  IMDUR Take 1 tablet (60 mg total) by mouth daily.   NITROSTAT 0.4 MG SL tablet Generic drug:  nitroGLYCERIN DISSOLVE 1 TABLET UNDER TONGUE AS NEEDED FOR CHEST PAIN,MAY REPEAT IN5 MINUTES FOR 2 DOSES.   pantoprazole 40 MG tablet Commonly known as:  PROTONIX Take 40 mg by mouth daily.   rosuvastatin 10 MG tablet Commonly known as:  CRESTOR TAKE ONE TABLET EVERY OTHER DAY.   topiramate 25 MG tablet Commonly known as:  TOPAMAX Take 25 mg by mouth 2 (two)  times daily. Morning and lunch time   VASCEPA 1 g Caps Generic drug:  Icosapent Ethyl Take 2 g by mouth 2 (two) times daily.      Verbal and written Discharge instructions given to the patient. Wound care per Discharge AVS Follow-up Information    Ruta Hinds, MD Follow up in 2 week(s).   Specialties:  Vascular Surgery, Cardiology Why:  office will call Contact information: [place] [address] [phone]           Signed: Laurence Slate [facility] 05/27/2016, 10:38 AM --- For VQI Registry use --- Instructions: Press F2 to tab through selections.  Delete question if not applicable.   Modified Rankin score at D/C (0-6): Rankin Score=0  IV medication needed for:  1. Hypertension: Yes 2. Hypotension: Yes  Post-op Complications: No  1. Post-op CVA or TIA: No  If yes: Event classification (right eye, left eye, right cortical, left cortical, verterobasilar, other):   If yes: Timing of event (intra-op, <6 hrs post-op, >=6 hrs post-op, unknown):   2. CN injury: No  If yes: CN  injuried   3. Myocardial infarction: No  If yes: Dx by (EKG or clinical, Troponin):   4.  CHF: No  5.  Dysrhythmia (new): No  6. Wound infection: No  7. Reperfusion symptoms: No  8. Return to OR: No  If yes: return to OR for (bleeding, neurologic, other CEA incision, other):   Discharge medications: Statin use:  Yes ASA use:  Yes Beta blocker use:  Yes ACE-Inhibitor use:  No  for medical reason   P2Y12 Antagonist use: [x ] None, [ ]  Plavix, [ ]  Plasugrel, [ ]  Ticlopinine, [ ]  Ticagrelor, [ ]  Other, [ ]  No for medical reason, [ ]  Non-compliant, [ ]  Not-indicated Anti-coagulant use:  [x ] None, [ ]  Warfarin, [ ]  Rivaroxaban, [ ]  Dabigatran, [ ]  Other, [ ]  No for medical reason, [ ]  Non-compliant, [ ]  Not-indicated

## 2016-06-05 ENCOUNTER — Encounter: Payer: Self-pay | Admitting: Vascular Surgery

## 2016-06-05 DIAGNOSIS — Z6831 Body mass index (BMI) 31.0-31.9, adult: Secondary | ICD-10-CM | POA: Diagnosis not present

## 2016-06-05 DIAGNOSIS — I739 Peripheral vascular disease, unspecified: Secondary | ICD-10-CM | POA: Diagnosis not present

## 2016-06-05 DIAGNOSIS — I2510 Atherosclerotic heart disease of native coronary artery without angina pectoris: Secondary | ICD-10-CM | POA: Diagnosis not present

## 2016-06-05 DIAGNOSIS — N183 Chronic kidney disease, stage 3 (moderate): Secondary | ICD-10-CM | POA: Diagnosis not present

## 2016-06-05 DIAGNOSIS — I10 Essential (primary) hypertension: Secondary | ICD-10-CM | POA: Diagnosis not present

## 2016-06-05 DIAGNOSIS — E119 Type 2 diabetes mellitus without complications: Secondary | ICD-10-CM | POA: Diagnosis not present

## 2016-06-12 ENCOUNTER — Ambulatory Visit (INDEPENDENT_AMBULATORY_CARE_PROVIDER_SITE_OTHER): Payer: Self-pay | Admitting: Vascular Surgery

## 2016-06-12 VITALS — BP 130/54 | HR 51 | Temp 98.2°F | Resp 16 | Ht 68.5 in | Wt 204.0 lb

## 2016-06-12 DIAGNOSIS — I739 Peripheral vascular disease, unspecified: Secondary | ICD-10-CM

## 2016-06-12 DIAGNOSIS — I6522 Occlusion and stenosis of left carotid artery: Secondary | ICD-10-CM

## 2016-06-12 NOTE — Progress Notes (Signed)
Patient is a 70 year old male who returns today for follow-up after left carotid endarterectomy. His primary complaint today is numbness and tingling along the left inferior portion of his jaw and around the corner of his mouth. Tongue is midline. He denies any symptoms of TIA amaurosis or stroke. Current Outpatient Prescriptions on File Prior to Visit  Medication Sig Dispense Refill  . acetaminophen (TYLENOL) 325 MG tablet Take 650 mg by mouth every 4 (four) hours as needed for mild pain. Reported on 01/03/2016    . amLODipine (NORVASC) 10 MG tablet TAKE 1 TABLET ONCE DAILY. 30 tablet 6  . aspirin EC 81 MG tablet Take 1 tablet (81 mg total) by mouth every other day.    . B-D ULTRAFINE III SHORT PEN 31G X 8 MM MISC See admin instructions.  0  . carvedilol (COREG) 12.5 MG tablet TAKE 1 TABLET TWICE DAILY. 60 tablet 10  . clopidogrel (PLAVIX) 75 MG tablet Take 1 tablet (75 mg total) by mouth daily with breakfast. 90 tablet 3  . fenofibrate 160 MG tablet Take 160 mg by mouth daily.    . furosemide (LASIX) 40 MG tablet Take 40-80 mg by mouth 2 (two) times daily. Take 2 tabs in the morning and 1 tab in the evening     . HUMALOG KWIKPEN 100 UNIT/ML SOPN Inject 10-15 Units into the skin 3 (three) times daily. Sliding scale. 10 units with breakfast and 15 units with lunch & 15 unit with supper    . hydrALAZINE (APRESOLINE) 100 MG tablet TAKE 1 TABLET TWICE DAILY. 60 tablet 8  . insulin glargine (LANTUS) 100 UNIT/ML injection Inject 40-80 Units into the skin 2 (two) times daily. 40 units in am, 80 units in pm    . isosorbide mononitrate (IMDUR) 60 MG 24 hr tablet Take 1 tablet (60 mg total) by mouth daily. 30 tablet 11  . NITROSTAT 0.4 MG SL tablet DISSOLVE 1 TABLET UNDER TONGUE AS NEEDED FOR CHEST PAIN,MAY REPEAT IN5 MINUTES FOR 2 DOSES. 25 tablet 1  . pantoprazole (PROTONIX) 40 MG tablet Take 40 mg by mouth daily.    . rosuvastatin (CRESTOR) 10 MG tablet TAKE ONE TABLET EVERY OTHER DAY. (Patient taking  differently: TAKE ONE TABLET EVERY tuesday, thursday, saturday and sunday) 45 tablet 1  . topiramate (TOPAMAX) 25 MG tablet Take 25 mg by mouth 2 (two) times daily. Morning and lunch time  4  . VASCEPA 1 G CAPS Take 2 g by mouth 2 (two) times daily.   4   Current Facility-Administered Medications on File Prior to Visit  Medication Dose Route Frequency Provider Last Rate Last Dose  . 0.9 %  sodium chloride infusion   Intravenous Continuous Leonie Man, MD         Physical exam:  Vitals:   06/12/16 1444 06/12/16 1449  BP: (!) 127/53 (!) 130/54  Pulse: (!) 52 (!) 51  Resp: 16   Temp: 98.2 F (36.8 C)   TempSrc: Oral   SpO2: 97%   Weight: 204 lb (92.5 kg)   Height: 5' 8.5" (1.74 m)     Neuro: No facial asymmetry tongue midline symmetric upper extremity and lower extremity motor strength 5 over 5  Neck: Well-healed left neck incision no drainage  Assessment: Doing well status post left carotid endarterectomy. Has some component of sensory nerve decreased sensation and marginal mandibular nerve neuropraxia. This should recover over the next few months. Patient was reassured regarding this.  Plan: The patient will follow-up with  repeat carotid duplex scan in 6 months time. He will also have bilateral ABIs for follow-up of his PAD. He will continue his statin aspirin and Plavix.  Ruta Hinds, MD [facility] [place] Office: [phone] Pager: [phone]

## 2016-06-13 NOTE — Addendum Note (Signed)
Addended by: Lianne Cure A on: 06/13/2016 03:34 PM   Modules accepted: Orders

## 2016-07-22 ENCOUNTER — Other Ambulatory Visit: Payer: Self-pay | Admitting: Gastroenterology

## 2016-07-22 DIAGNOSIS — Z1211 Encounter for screening for malignant neoplasm of colon: Secondary | ICD-10-CM | POA: Diagnosis not present

## 2016-07-22 DIAGNOSIS — K5904 Chronic idiopathic constipation: Secondary | ICD-10-CM | POA: Diagnosis not present

## 2016-07-22 DIAGNOSIS — K219 Gastro-esophageal reflux disease without esophagitis: Secondary | ICD-10-CM | POA: Diagnosis not present

## 2016-07-22 DIAGNOSIS — R140 Abdominal distension (gaseous): Secondary | ICD-10-CM | POA: Diagnosis not present

## 2016-07-22 DIAGNOSIS — Z86010 Personal history of colonic polyps: Secondary | ICD-10-CM | POA: Diagnosis not present

## 2016-07-23 DIAGNOSIS — L820 Inflamed seborrheic keratosis: Secondary | ICD-10-CM | POA: Diagnosis not present

## 2016-07-24 DIAGNOSIS — E113593 Type 2 diabetes mellitus with proliferative diabetic retinopathy without macular edema, bilateral: Secondary | ICD-10-CM | POA: Diagnosis not present

## 2016-07-24 DIAGNOSIS — E1139 Type 2 diabetes mellitus with other diabetic ophthalmic complication: Secondary | ICD-10-CM | POA: Diagnosis not present

## 2016-07-24 DIAGNOSIS — B349 Viral infection, unspecified: Secondary | ICD-10-CM | POA: Diagnosis not present

## 2016-07-24 DIAGNOSIS — H26492 Other secondary cataract, left eye: Secondary | ICD-10-CM | POA: Diagnosis not present

## 2016-07-24 DIAGNOSIS — R05 Cough: Secondary | ICD-10-CM | POA: Diagnosis not present

## 2016-07-24 DIAGNOSIS — I10 Essential (primary) hypertension: Secondary | ICD-10-CM | POA: Diagnosis not present

## 2016-07-24 DIAGNOSIS — Z961 Presence of intraocular lens: Secondary | ICD-10-CM | POA: Diagnosis not present

## 2016-07-24 DIAGNOSIS — J209 Acute bronchitis, unspecified: Secondary | ICD-10-CM | POA: Diagnosis not present

## 2016-07-24 DIAGNOSIS — E11319 Type 2 diabetes mellitus with unspecified diabetic retinopathy without macular edema: Secondary | ICD-10-CM | POA: Diagnosis not present

## 2016-07-24 DIAGNOSIS — R0600 Dyspnea, unspecified: Secondary | ICD-10-CM | POA: Diagnosis not present

## 2016-07-24 DIAGNOSIS — Z6830 Body mass index (BMI) 30.0-30.9, adult: Secondary | ICD-10-CM | POA: Diagnosis not present

## 2016-07-29 ENCOUNTER — Other Ambulatory Visit: Payer: Self-pay | Admitting: Cardiology

## 2016-07-30 ENCOUNTER — Telehealth: Payer: Self-pay | Admitting: *Deleted

## 2016-07-30 NOTE — Telephone Encounter (Signed)
Rx(s) sent to pharmacy electronically.  

## 2016-07-30 NOTE — Telephone Encounter (Signed)
FAXED [facility]  ON 07/28/16  FOR COLONOSCOPY  WITH DR HUNG  PER  WRITTEN ORDER FROM DR HARDING  OKAY TO HOLD PLAVIX AND ASPIRIN FOR  5 DAYS PRE PROCEDURE.

## 2016-08-29 ENCOUNTER — Other Ambulatory Visit: Payer: Self-pay | Admitting: Cardiology

## 2016-09-02 ENCOUNTER — Encounter (HOSPITAL_COMMUNITY): Payer: Self-pay | Admitting: *Deleted

## 2016-09-04 ENCOUNTER — Encounter (HOSPITAL_COMMUNITY): Payer: Self-pay | Admitting: Anesthesiology

## 2016-09-04 NOTE — Anesthesia Preprocedure Evaluation (Deleted)
Anesthesia Evaluation    Airway        Dental   Pulmonary COPD, former smoker,           Cardiovascular hypertension, Pt. on medications and Pt. on home beta blockers (-) angina+ CAD, + Past MI, + Cardiac Stents and + Peripheral Vascular Disease    '16 ECHO: EF 55-60%, aortic sclerosis, mild MR, mild-mod TR 05/14/16 Stress: normal perfusion, EF 55%   Neuro/Psych  Headaches, Anxiety    GI/Hepatic Neg liver ROS, GERD  Medicated and Controlled,  Endo/Other  diabetes, Insulin DependentMorbid obesity  Renal/GU Renal InsufficiencyRenal disease (creat 2.45)     Musculoskeletal   Abdominal (+) + obese,   Peds  Hematology   Anesthesia Other Findings   Reproductive/Obstetrics                             Lab Results  Component Value Date   WBC 9.1 05/22/2016   HGB 9.6 (L) 05/22/2016   HCT 29.7 (L) 05/22/2016   MCV 85.8 05/22/2016   PLT 221 05/22/2016   Lab Results  Component Value Date   CREATININE 1.99 (H) 05/22/2016   BUN 34 (H) 05/22/2016   NA 136 05/22/2016   K 4.0 05/22/2016   CL 107 05/22/2016   CO2 21 (L) 05/22/2016    Anesthesia Physical  Anesthesia Plan  ASA: III  Anesthesia Plan: MAC   Post-op Pain Management:    Induction: Intravenous  Airway Management Planned: Natural Airway and Simple Face Mask  Additional Equipment:   Intra-op Plan:   Post-operative Plan:   Informed Consent:   Plan Discussed with:   Anesthesia Plan Comments: (Plan routine monitors, A line, GETA)        Anesthesia Quick Evaluation

## 2016-09-05 ENCOUNTER — Encounter (HOSPITAL_COMMUNITY)
Admission: RE | Disposition: A | Discharge status: Home / Self Care | Payer: Self-pay | Source: Ambulatory Visit | Attending: Gastroenterology

## 2016-09-05 ENCOUNTER — Ambulatory Visit (HOSPITAL_COMMUNITY)
Admission: RE | Admit: 2016-09-05 | Discharge: 2016-09-05 | Disposition: A | Discharge status: Home / Self Care | Payer: Medicare Other | Source: Ambulatory Visit | Attending: Gastroenterology | Admitting: Gastroenterology

## 2016-09-05 DIAGNOSIS — Z1211 Encounter for screening for malignant neoplasm of colon: Secondary | ICD-10-CM | POA: Insufficient documentation

## 2016-09-05 DIAGNOSIS — Z539 Procedure and treatment not carried out, unspecified reason: Secondary | ICD-10-CM | POA: Insufficient documentation

## 2016-09-05 SURGERY — CANCELLED PROCEDURE
Anesthesia: Monitor Anesthesia Care

## 2016-09-05 MED ORDER — PROPOFOL 10 MG/ML IV BOLUS
INTRAVENOUS | Status: AC
  Filled 2016-09-05: qty 40

## 2016-09-05 SURGICAL SUPPLY — 21 items

## 2016-09-05 NOTE — Progress Notes (Signed)
Pt stated drank all the prep but results were watery brown/yellow. Pt states he ate salad and fish yesterday at 1130. Dr. Benson Norway made aware and wants to cancel the colonoscopy for today.

## 2016-09-09 DIAGNOSIS — E113553 Type 2 diabetes mellitus with stable proliferative diabetic retinopathy, bilateral: Secondary | ICD-10-CM | POA: Diagnosis not present

## 2016-09-09 DIAGNOSIS — E1139 Type 2 diabetes mellitus with other diabetic ophthalmic complication: Secondary | ICD-10-CM | POA: Diagnosis not present

## 2016-09-09 DIAGNOSIS — H3563 Retinal hemorrhage, bilateral: Secondary | ICD-10-CM | POA: Diagnosis not present

## 2016-09-09 DIAGNOSIS — Z961 Presence of intraocular lens: Secondary | ICD-10-CM | POA: Diagnosis not present

## 2016-09-10 ENCOUNTER — Other Ambulatory Visit: Payer: Self-pay | Admitting: Cardiology

## 2016-09-11 ENCOUNTER — Encounter: Payer: Self-pay | Admitting: Cardiology

## 2016-09-11 ENCOUNTER — Ambulatory Visit (INDEPENDENT_AMBULATORY_CARE_PROVIDER_SITE_OTHER): Payer: Medicare Other | Admitting: Cardiology

## 2016-09-11 VITALS — BP 186/70 | HR 54 | Ht 68.0 in | Wt 206.0 lb

## 2016-09-11 DIAGNOSIS — I5032 Chronic diastolic (congestive) heart failure: Secondary | ICD-10-CM | POA: Diagnosis not present

## 2016-09-11 DIAGNOSIS — I25709 Atherosclerosis of coronary artery bypass graft(s), unspecified, with unspecified angina pectoris: Secondary | ICD-10-CM

## 2016-09-11 DIAGNOSIS — I209 Angina pectoris, unspecified: Secondary | ICD-10-CM

## 2016-09-11 DIAGNOSIS — R600 Localized edema: Secondary | ICD-10-CM

## 2016-09-11 DIAGNOSIS — E669 Obesity, unspecified: Secondary | ICD-10-CM | POA: Diagnosis not present

## 2016-09-11 DIAGNOSIS — I10 Essential (primary) hypertension: Secondary | ICD-10-CM

## 2016-09-11 DIAGNOSIS — E785 Hyperlipidemia, unspecified: Secondary | ICD-10-CM

## 2016-09-11 DIAGNOSIS — I2510 Atherosclerotic heart disease of native coronary artery without angina pectoris: Secondary | ICD-10-CM | POA: Diagnosis not present

## 2016-09-11 DIAGNOSIS — R011 Cardiac murmur, unspecified: Secondary | ICD-10-CM | POA: Diagnosis not present

## 2016-09-11 MED ORDER — HYDRALAZINE HCL 100 MG PO TABS: 100.0000 mg | ORAL_TABLET | Freq: Three times a day (TID) | ORAL | 1 refills | Status: DC

## 2016-09-11 NOTE — Patient Instructions (Signed)
Medication Instructions: Increase Hydralazine to 100 mg three times daily.   Follow-Up: Your physician wants you to follow-up in: 6 months with Dr. Ellyn Hack. You will receive a reminder letter in the mail two months in advance. If you don't receive a letter, please call our office to schedule the follow-up appointment.  If you need a refill on your cardiac medications before your next appointment, please call your pharmacy.

## 2016-09-11 NOTE — Progress Notes (Signed)
PCP: Sheela Stack, MD  Nephrologist: Dr. Vanetta Mulders  Clinic Note: Chief Complaint  Patient presents with  . Follow-up    6 months; CAD, hypertension, PAD  . Edema    in ankles and legs occasionally.    HPI: Lance Collier is a 71 y.o. male with a PMH below who presents today for 6 month f/u for CAD.  Lance Collier was last seen on 03/04/2016. He was doing fairly well from cardiac standpoint. Still indicated that his exercise limited by claudication more so than dyspnea. He did have a cousin occasional episodes of dizziness. Poor balance. This is also part of preoperative evaluation for his carotid endarterectomy  Recent Hospitalizations: For L CEA in Dec 2017  Studies Reviewed:   MYOVIEW 04/2016   Nuclear stress EF: 55%.  The left ventricular ejection fraction is normal (55-65%).  There was no ST segment deviation noted during stress.  The study is normal.  This is a low risk study.    Interval History: [name] presents here for routine follow-up doing fairly well also from a cardiac standpoint. He is little bit concerned because his blood pressure is higher today than it usually is. Home usually runs about 140 mmHg. He has yet to take his blood pressure medication today because he got a late start. He says he tries to walk 1 or 2 days out of the week but has been so limited because of trying to close out things at work. 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  He still says that he'll get short of breath if he goes beyond his usual amount of walking, but for the most part is limited by his claudication. With his blood pressure being elevated, he denies any lightheadedness or dizziness associated with it. He still has difficulty with balance, but is doing fairly well with that with his walks when he has the time. He denies any anginal chest tightness or  pressure with rest or exertion. No PND, orthopnea or edema.  No palpitations, weakness or syncope/near syncope. No TIA/amaurosis fugax symptoms. No melena, hematochezia, hematuria, or epstaxis. Marland Kitchen He has recovered well from his carotid endarterectomy surgery without major complications.  ROS: A comprehensive was performed. Review of Systems  Constitutional: Positive for malaise/fatigue (This feels limited as what activity can do most claudication.).  Cardiovascular: Positive for claudication (Lifestyle limiting).  Gastrointestinal: Negative for heartburn and nausea.  Musculoskeletal: Positive for back pain and joint pain.  Neurological: Positive for dizziness (Overall better. Just balance issues from peripheral neuropathy).  Psychiatric/Behavioral: The patient has insomnia. The patient is not nervous/anxious.   All other systems reviewed and are negative.   Past Medical History:  Diagnosis Date  . Anemia   . Aortic sclerosis  - without Stenosis  March 2014   Echo: Aortic sclerosis without stenosis. EF 55-60% with normal WM; mild concentric hypertrophy with normal relaxation.  Marland Kitchen CAD (coronary artery disease), autologous vein bypass graft 09/2014   10/03/14: Cath for Class III-IV Angina -- 80% ostial-proximal SVG-RPDA (minimal flow down native PDA from native RCA with 50% proximal and distal, patent LIMA-LAD, SVG-OM with retrograde filling of OM 2. --> 10/12/14: Staged PCI of SVG-RPDA - Xience DES  3.0 mm x 38 mm; (3.4  - 3.3 mm)  . CAD S/P percutaneous coronary angioplasty 06/12/2011; 09/2104   a) 2012: Complex PCI mRCA ([place]) --  2 overlapping Promus  element DES stents.; 09/2014:  ost-prox SVG-rPDA - Xience DES 3.0 mm x 38 mm; (3.4  - 3.3 mm)  . CHF (congestive heart failure) ([place])   . Childhood asthma   . CKD (chronic kidney disease), stage III    "mild; related to diabetes" (10/02/2014)  . Complication of anesthesia    "I've had name recall recognition problems since my last  anesthesia"  . DM (diabetes mellitus) type II uncontrolled with eye manifestation ([place]) 2013   Diabetic retinopathy with retinal hemorrhages since;, recurrent in August 2014 treated with Avastin shots  . Dyspnea    mainly with exertion  . GERD (gastroesophageal reflux disease)   . Heart murmur    questionable  . High cholesterol   . Hypertension   . Migraine ~ 2004   "once"  . PAD (peripheral artery disease) ([facility])    with claudication  . PONV (postoperative nausea and vomiting)    s/p tonsillectomy  . 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 07/01/2013 (Cath for Crescendo Unstable Angina) --> [name]: For LM disease; LIMA-LAD, SVG-RCA, SCG-Cx; b. 09/2014 Cath/Staged PCI: patent LIMA->LAD, VG->OM1/RI, VG->RCA 80% (3.0x38 Xience Alpine DES on 10/12/2014).  . 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  Likely thromboembolic event with PDA occlusion. 06/12/2011   Post Complex PCI, pt states no mi    Past Surgical History:  Procedure Laterality Date  . CARDIAC CATHETERIZATION  06/28/2013   ostial LM disese, RCA ~40-50%ISR  . CAROTID ENDARTERECTOMY Left 05/21/2016  . CORONARY ANGIOPLASTY WITH STENT PLACEMENT Right September 2012   2 overlapping Promus DES 2.7 mm at 12 mm x2; postdilated to 3 mm  . CORONARY ARTERY BYPASS GRAFT N/A 07/01/2013   Procedure: CORONARY ARTERY BYPASS GRAFTING (CABG);  Surgeon: Grace Isaac, MD;  Location: [place];  Service: Open Heart Surgery;  Laterality: N/A;  Times 3 using left internal mammary artery and endoscopically harvested bilateral saphenous vein  . ENDARTERECTOMY Left 05/21/2016   Procedure: LEFT CAROTID ENDARTERECTOMY;  Surgeon: Elam Dutch, MD;  Location: [place];  Service: Vascular;  Laterality: Left;  . EYE SURGERY Bilateral    ioc for cataracts  . INTRAOPERATIVE TRANSESOPHAGEAL ECHOCARDIOGRAM N/A 07/01/2013   Procedure: INTRAOPERATIVE TRANSESOPHAGEAL ECHOCARDIOGRAM;  Surgeon: Grace Isaac, MD;  Location:  [place];  Service: Open Heart Surgery;  Laterality: N/A;  . LEFT AND RIGHT HEART CATHETERIZATION WITH CORONARY/GRAFT ANGIOGRAM N/A 10/03/2014   Procedure: LEFT AND RIGHT HEART CATHETERIZATION WITH Beatrix Fetters;  Surgeon: Leonie Man, MD;  [facility] CATH LAB;  Service: Cardiovascular;  Laterality: N/A;  . LEFT HEART CATHETERIZATION WITH CORONARY ANGIOGRAM N/A 06/11/2011   Procedure: LEFT HEART CATHETERIZATION WITH CORONARY ANGIOGRAM;  Surgeon: Fulton Reek, MD;  Location: [facility] CATH LAB;  Service: Cardiovascular;  Laterality: N/A;  . LEFT HEART CATHETERIZATION WITH CORONARY ANGIOGRAM N/A 06/28/2013   Procedure: LEFT HEART CATHETERIZATION WITH CORONARY ANGIOGRAM;  Surgeon: Leonie Man, MD;  Location: [facility] CATH LAB;  Service: Cardiovascular;  Laterality: N/A;  . NM MYOVIEW LTD  January '13   Diaphragmatic attenuation versus inferior infarct. No ischemia. Normal EF normal wall motion.  Marland Kitchen NM MYOVIEW LTD  June 2014   no ischemia  . NM MYOVIEW LTD  10/2014   LEXISCAN: Normal EF, 61%, No ischemia or infarction.  Mild diaphragmatic attenuation  . PATCH ANGIOPLASTY Left 05/21/2016   Procedure: PATCH ANGIOPLASTY USING HEMASHIELD PLATINUM FINESSE 0.3in x 3 in;  Surgeon: Elam Dutch, MD;  Location: [facility];  Service: Vascular;  Laterality: Left;  . PERCUTANEOUS CORONARY STENT INTERVENTION (PCI-S)  06/11/2011   Procedure: PERCUTANEOUS CORONARY STENT INTERVENTION (PCI-S);  Surgeon: Fulton Reek, MD;  Location: [facility] CATH LAB;  Service: Cardiovascular;;  . PERCUTANEOUS CORONARY STENT INTERVENTION (PCI-S) N/A 10/12/2014   Procedure: PERCUTANEOUS CORONARY STENT INTERVENTION (PCI-S);  Surgeon: Leonie Man, MD;  Location: [facility] CATH LAB;  Ostial-Prox SVG-RCA Xience Alpine DES 3.0 mm x 38 mm; (3.4  - 3.3 mm)  . RETINAL LASER PROCEDURE Bilateral    "more than once"   . TONSILLECTOMY  as child  . TRANSTHORACIC ECHOCARDIOGRAM  06/29/2013   Focal basal hypertrophy with normal LV size. EF 60-65% no  regional WMA. Mild LA dilation  . TRANSTHORACIC ECHOCARDIOGRAM  April 2016   Mod focal basal & mild overall LVH. EF 55-60%. Mild Ao Sclerosis, Mild MR with MAC. Only minimally elevated RVP.    Current Meds  Medication Sig  . acetaminophen (TYLENOL) 325 MG tablet Take 650 mg by mouth daily as needed for mild pain. Reported on 01/03/2016  . amLODipine (NORVASC) 10 MG tablet TAKE 1 TABLET ONCE DAILY.  Marland Kitchen aspirin EC 81 MG tablet Take 1 tablet (81 mg total) by mouth every other day. (Patient taking differently: Take 77m once daily on Tues, Thurs, Sat, and Sun)  . B-D ULTRAFINE III SHORT PEN 31G X 8 MM MISC See admin instructions.  . carvedilol (COREG) 12.5 MG tablet TAKE 1 TABLET TWICE DAILY.  .Marland Kitchenclopidogrel (PLAVIX) 75 MG tablet Take 1 tablet (75 mg total) by mouth daily with breakfast.  . fenofibrate 160 MG tablet Take 160 mg by mouth daily.  . furosemide (LASIX) 40 MG tablet Take 40-80 mg by mouth 2 (two) times daily. Take 80 mg in the morning and 40 mg in the evening  . HUMALOG KWIKPEN 100 UNIT/ML SOPN Inject 10-15 Units into the skin 3 (three) times daily. Sliding scale  scale = blood sugar level - 100 / 15  (max 15 units)  . hydrALAZINE (APRESOLINE) 100 MG tablet Take 1 tablet (100 mg total) by mouth 3 (three) times daily.  . insulin glargine (LANTUS) 100 UNIT/ML injection Inject 40-80 Units into the skin 2 (two) times daily. 40 units in am, 80 units in pm  . isosorbide mononitrate (IMDUR) 60 MG 24 hr tablet Take 1 tablet (60 mg total) by mouth daily.  .Marland KitchenNITROSTAT 0.4 MG SL tablet DISSOLVE 1 TABLET UNDER TONGUE AS NEEDED FOR CHEST PAIN,MAY REPEAT IN5 MINUTES FOR 2 DOSES.  .Marland Kitchenpantoprazole (PROTONIX) 40 MG tablet Take 40 mg by mouth daily.  . rosuvastatin (CRESTOR) 10 MG tablet TAKE ONE TABLET EVERY OTHER DAY. (Patient taking differently: take 1106m daily on tuesday, thursday, saturday and sunday)  . topiramate (TOPAMAX) 25 MG tablet Take 25 mg by mouth daily. May take an additional 2572mas  needed for appetite suppression  . VASCEPA 1 G CAPS Take 2 g by mouth 2 (two) times daily.   . [DISCONTINUED] hydrALAZINE (APRESOLINE) 100 MG tablet TAKE 1 TABLET TWICE DAILY.   Current Facility-Administered Medications for the 09/11/16 encounter (Office Visit) with DavLeonie ManD  Medication  . 0.9 %  sodium chloride infusion    Allergies  Allergen Reactions  . Atorvastatin Other (See Comments)    Leg pain  . Ibuprofen Hives  . Pravastatin     Leg pain  . Simvastatin Other (See Comments)    Leg pain    Social History   Social  History  . Marital status: Married    Spouse name: N/A  . Number of children: N/A  . Years of education: N/A   Social History Main Topics  . Smoking status: Former Smoker    Packs/day: 2.00    Years: 27.00    Types: Cigarettes    Quit date: 11/23/1996  . Smokeless tobacco: Never Used  . Alcohol use 0.0 oz/week     Comment: rarely  . Drug use: No  . Sexual activity: Not Currently   Other Topics Concern  . 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 He notably has not been exercising like he used to. He [place] walk [place] and [place]. He has social alcoholic beverages but is trying to cut that back and does not smoke.       He tells me he is a sucker for chips but does not eat sweets because of diabetes.    family history includes Cancer in his father, maternal grandmother, mother, paternal grandfather, and sister; Hyperlipidemia in his father; Hypertension in his father.  Wt Readings from Last 3 Encounters:  09/11/16 93.4 kg (206 lb)  06/12/16 92.5 kg (204 lb)  05/21/16 95.3 kg (210 lb 1.6 oz)    PHYSICAL EXAM BP (!) 186/70   Pulse (!) 54   Ht 5' 8"  (1.727 m)   Wt 93.4 kg (206 lb)   BMI 31.32 kg/m  General appearance: alert, cooperative, appears stated age, no distress and Well-nourished, and well groomed. HEENT: [place]/AT,  EOMI, MMM, anicteric sclera Neck: no adenopathy, no carotid bruit, mild JVD (with HJR) and supple, symmetrical, trachea midline. Left carotid endarterectomy scar well healed. Lungs: CTA B., normal percussion bilaterally and Nonlabored, good air movement - no obvious rales Heart: RRR, S1 level and split S2, soft 1-2/6 SEM @ RUSB & soft HSM.Marland Kitchen No click, rub or gallop, normal apical impulse. Well-healed scar. Abdomen: soft, non-tender; bowel sounds normal; no masses, no organomegaly and Rotund abdomen Extremities: 1-2++ bilaterally. and no ulcers, gangrene or trophic changes Pulses: mildly diminished pedal pulses bilaterally but 2+ bounding radial pulses bilaterally Neurologic: Grossly normal; cranial nerves grossly intact; slow, deliberate gait    Adult ECG Report  Rate: 54 ;  Rhythm: sinus bradycardia and 1 AVB (PR interval 202, LVH with repolarization abnormality/nonspecific ST and T-wave changes;   Narrative Interpretation: Stable EKG. 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 Continue to monitor. He is on stable dose of beta blocker, calcium channel blocker and Imdur. As yet, no requirement for Ranexa.      Relevant Medications   hydrALAZINE (APRESOLINE) 100 MG tablet   Chronic diastolic CHF (congestive heart failure), NYHA class 2 (HCC) (Chronic)    Relatively stable symptoms. He doesn't have that much the way of any PND or orthopnea. On stable dose of diuretic from his nephrologist. He is on hydralazine/nitrate for afterload reduction with carvedilol and Norvasc.      Relevant Medications   hydrALAZINE (APRESOLINE) 100 MG tablet   Coronary artery disease involving coronary bypass graft with angina pectoris ([place]) - Primary (Chronic)     3 years  out CABG, and almost 2 years out from PCI to the SVG-RCA. Recent Myoview with no evidence of ischemia or infarction. No active anginal symptoms as he has lifestyle limiting claudication. Plan: Continue current dose of carvedilol (cannot increase due to resting bradycardia), amlodipine and Imdur for further anti-anginal effect. He is on Crestor along with aspirin and Plavix.      Relevant Medications   hydrALAZINE (APRESOLINE) 100 MG tablet   Edema of both legs (Chronic)    Relatively well-controlled with current dose of diuretic and support stockings. His diuretic has been dosed by nephrology.      Essential hypertension (Chronic)    Not is well-controlled as I would like. He is not on ACE inhibitor or ARB because of his CK D. Plan will be to increase his hydralazine 200 mg 3 times a day.      Relevant Medications   hydrALAZINE (APRESOLINE) 100 MG tablet   Other Relevant Orders   EKG 12-Lead (Completed)   Hyperlipidemia with target LDL less than 70; intolerance to atorvastatin and simvastatin (Chronic)    Continues to be on Crestor and fenofibrate. Crestor is every other day. Labs being monitored by his PCP. Very important with his diabetes and already existing peripheral carotid and coronary disease to have aggressive control. LDL goal should be clearly below 70. Low threshold to consider PCSK9 inhibitor.      Relevant Medications   hydrALAZINE (APRESOLINE) 100 MG tablet   Left main coronary artery disease; with RCA in-stent restenosis (Chronic)    Significant native CAD status post CABG. He has significant risk factors insulin-dependent diabetes and hyperlipidemia as well as obesity and hypertension. Close glycemic control and aggressive lipid management is paramount to preventing any further progression of disease.      Relevant Medications   hydrALAZINE (APRESOLINE) 100 MG tablet   Murmur, cardiac     Related to aortic sclerosis without stenosis      Obesity (BMI  30-39.9) (Chronic)    He is trying to pick up his level activity and watch his diet. He has lost some weight. At this point I think dietary modification is the only real way he can lose weight because he is so limited by claudication.         Current medicines are reviewed at length with the patient today. (+/- concerns) n/a The following changes have been made: n/a  Patient Instructions  Medication Instructions: Increase Hydralazine to 100 mg three times daily.   Follow-Up: Your physician wants you to follow-up in: 6 months with Dr. Ellyn Hack. You will receive a reminder letter in the mail two months in advance. If you don't receive a letter, please call our office to schedule the follow-up appointment.  If you need a refill on your cardiac medications before your next appointment, please call your pharmacy.    Studies Ordered:   Orders Placed This Encounter  Procedures  . EKG 12-Lead      Glenetta Hew, M.D., M.S. Interventional Cardiologist   Pager # [phone] Phone # [phone] [address] [place],  [address]

## 2016-09-13 ENCOUNTER — Encounter: Payer: Self-pay | Admitting: Cardiology

## 2016-09-13 NOTE — Assessment & Plan Note (Signed)
Significant native CAD status post CABG. He has significant risk factors insulin-dependent diabetes and hyperlipidemia as well as obesity and hypertension. Close glycemic control and aggressive lipid management is paramount to preventing any further progression of disease.

## 2016-09-13 NOTE — Assessment & Plan Note (Signed)
No further anginal symptoms after his PCI in 2016. Continue to monitor. He is on stable dose of beta blocker, calcium channel blocker and Imdur. As yet, no requirement for Ranexa.

## 2016-09-13 NOTE — Assessment & Plan Note (Signed)
He is trying to pick up his level activity and watch his diet. He has lost some weight. At this point I think dietary modification is the only real way he can lose weight because he is so limited by claudication.

## 2016-09-13 NOTE — Assessment & Plan Note (Signed)
Continues to be on Crestor and fenofibrate. Crestor is every other day. Labs being monitored by his PCP. Very important with his diabetes and already existing peripheral carotid and coronary disease to have aggressive control. LDL goal should be clearly below 70. Low threshold to consider PCSK9 inhibitor.

## 2016-09-13 NOTE — Assessment & Plan Note (Signed)
3 years out CABG, and almost 2 years out from PCI to the SVG-RCA. Recent Myoview with no evidence of ischemia or infarction. No active anginal symptoms as he has lifestyle limiting claudication. Plan: Continue current dose of carvedilol (cannot increase due to resting bradycardia), amlodipine and Imdur for further anti-anginal effect. He is on Crestor along with aspirin and Plavix.

## 2016-09-13 NOTE — Assessment & Plan Note (Signed)
Related to aortic sclerosis without stenosis

## 2016-09-13 NOTE — Assessment & Plan Note (Signed)
Relatively well-controlled with current dose of diuretic and support stockings. His diuretic has been dosed by nephrology.

## 2016-09-13 NOTE — Assessment & Plan Note (Signed)
Not is well-controlled as I would like. He is not on ACE inhibitor or ARB because of his CK D. Plan will be to increase his hydralazine 200 mg 3 times a day.

## 2016-09-13 NOTE — Assessment & Plan Note (Signed)
Relatively stable symptoms. He doesn't have that much the way of any PND or orthopnea. On stable dose of diuretic from his nephrologist. He is on hydralazine/nitrate for afterload reduction with carvedilol and Norvasc.

## 2016-10-13 DIAGNOSIS — E113599 Type 2 diabetes mellitus with proliferative diabetic retinopathy without macular edema, unspecified eye: Secondary | ICD-10-CM | POA: Diagnosis not present

## 2016-10-13 DIAGNOSIS — Z6830 Body mass index (BMI) 30.0-30.9, adult: Secondary | ICD-10-CM | POA: Diagnosis not present

## 2016-10-13 DIAGNOSIS — I5032 Chronic diastolic (congestive) heart failure: Secondary | ICD-10-CM | POA: Diagnosis not present

## 2016-10-13 DIAGNOSIS — E784 Other hyperlipidemia: Secondary | ICD-10-CM | POA: Diagnosis not present

## 2016-10-13 DIAGNOSIS — I25810 Atherosclerosis of coronary artery bypass graft(s) without angina pectoris: Secondary | ICD-10-CM | POA: Diagnosis not present

## 2016-10-13 DIAGNOSIS — I6529 Occlusion and stenosis of unspecified carotid artery: Secondary | ICD-10-CM | POA: Diagnosis not present

## 2016-10-13 DIAGNOSIS — E119 Type 2 diabetes mellitus without complications: Secondary | ICD-10-CM | POA: Diagnosis not present

## 2016-10-13 DIAGNOSIS — N08 Glomerular disorders in diseases classified elsewhere: Secondary | ICD-10-CM | POA: Diagnosis not present

## 2016-10-13 DIAGNOSIS — D649 Anemia, unspecified: Secondary | ICD-10-CM | POA: Diagnosis not present

## 2016-10-13 DIAGNOSIS — R946 Abnormal results of thyroid function studies: Secondary | ICD-10-CM | POA: Diagnosis not present

## 2016-10-13 DIAGNOSIS — R079 Chest pain, unspecified: Secondary | ICD-10-CM | POA: Diagnosis not present

## 2016-10-13 DIAGNOSIS — Z1389 Encounter for screening for other disorder: Secondary | ICD-10-CM | POA: Diagnosis not present

## 2016-10-22 ENCOUNTER — Telehealth: Payer: Self-pay | Admitting: Internal Medicine

## 2016-10-22 NOTE — Telephone Encounter (Signed)
Patient states that he is due for another colonoscopy. He is requesting to see Dr. Henrene Pastor. Patient states that the reason why he is transferring is because "Dr. Collene Mares office was rude to him". Records placed on Dr. Blanch Media desk for review.

## 2016-10-23 ENCOUNTER — Encounter: Payer: Self-pay | Admitting: Physician Assistant

## 2016-10-23 NOTE — Telephone Encounter (Signed)
Dr.Perry reviewed records and accepted for patient to be scheduled for direct colonoscopy in [place].

## 2016-10-27 ENCOUNTER — Other Ambulatory Visit: Payer: Self-pay | Admitting: Cardiology

## 2016-11-04 DIAGNOSIS — I10 Essential (primary) hypertension: Secondary | ICD-10-CM | POA: Diagnosis not present

## 2016-11-04 DIAGNOSIS — N183 Chronic kidney disease, stage 3 (moderate): Secondary | ICD-10-CM | POA: Diagnosis not present

## 2016-11-04 DIAGNOSIS — Z6831 Body mass index (BMI) 31.0-31.9, adult: Secondary | ICD-10-CM | POA: Diagnosis not present

## 2016-11-04 DIAGNOSIS — E1139 Type 2 diabetes mellitus with other diabetic ophthalmic complication: Secondary | ICD-10-CM | POA: Diagnosis not present

## 2016-11-05 ENCOUNTER — Encounter (INDEPENDENT_AMBULATORY_CARE_PROVIDER_SITE_OTHER): Payer: Self-pay

## 2016-11-05 ENCOUNTER — Encounter: Payer: Self-pay | Admitting: Physician Assistant

## 2016-11-05 ENCOUNTER — Ambulatory Visit (INDEPENDENT_AMBULATORY_CARE_PROVIDER_SITE_OTHER): Payer: Medicare Other | Admitting: Physician Assistant

## 2016-11-05 ENCOUNTER — Telehealth: Payer: Self-pay | Admitting: Emergency Medicine

## 2016-11-05 VITALS — BP 138/56 | HR 58 | Ht 68.11 in | Wt 210.0 lb

## 2016-11-05 DIAGNOSIS — Z1211 Encounter for screening for malignant neoplasm of colon: Secondary | ICD-10-CM | POA: Diagnosis not present

## 2016-11-05 DIAGNOSIS — Z1212 Encounter for screening for malignant neoplasm of rectum: Secondary | ICD-10-CM | POA: Diagnosis not present

## 2016-11-05 DIAGNOSIS — Z86010 Personal history of colonic polyps: Secondary | ICD-10-CM | POA: Diagnosis not present

## 2016-11-05 DIAGNOSIS — Z7901 Long term (current) use of anticoagulants: Secondary | ICD-10-CM | POA: Diagnosis not present

## 2016-11-05 DIAGNOSIS — Z01818 Encounter for other preprocedural examination: Secondary | ICD-10-CM

## 2016-11-05 MED ORDER — NA SULFATE-K SULFATE-MG SULF 17.5-3.13-1.6 GM/177ML PO SOLN: 1.0000 | ORAL | 0 refills | Status: DC

## 2016-11-05 NOTE — Telephone Encounter (Signed)
   Addis W [name] 1945-07-28 [id number]  Dear Dr. Ellyn Hack:  We have scheduled the above named patient for a colonoscopy procedure. Our records show that (s)he is on anticoagulation therapy.  Please advise as to whether the patient may come off their therapy of Plavix 5 days prior to their procedure which is scheduled for 12-25-16.  Please route your response to Tinnie Gens, CMA or fax response to [phone].  Sincerely,    [facility]

## 2016-11-05 NOTE — Progress Notes (Signed)
Chief Complaint: Consult for a screening colonoscopy in a patient on chronic anticoagulation with Plavix  HPI:  Lance Collier is a 71 year old Caucasian male with a past medical history as listed below including CAD status post bypass graft, last EF 55-60% maintained on Plavix as well as CKD and multiple others listed below,  who was referred to me by Reynold Bowen, MD for a consult of his screening colonoscopy due to history of adenomatous polyps.     Patient's information has previously been reviewed by Dr. Henrene Pastor and he was [facility] for a colonoscopy in the [facility]. 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 Patient has also previously been seen by Dr. Marlon Pel and is now due for a screening colonoscopy.   Today, the patient explains that he was not very happy with [facility] Mares and [facility] and wanted to switch his care here to Dr. Henrene Pastor. Apparently recently he prepped and was ready for a colonoscopy and had already held his Plavix for 5 days but his sugar dropped to 50 and the patient had to eat something in order to increase this and when he showed up to the hospital the next morning his procedure was canceled with apparently no communication from the doctor regarding this due to the fact that he has eaten. The patient was quite upset. He tells me today that he is due for screening colonoscopy and is aware that he needs to hold his Plavix and that he did fine with the prep previously. He denies any GI complaints today.   Patient denies fever, chills, blood in his stool, melena, weight loss, fatigue, anorexia, nausea, vomiting, change in bowel habits or abdominal pain.  Past Medical History:  Diagnosis Date  . Anemia   . Aortic sclerosis  - without Stenosis  March 2014   Echo: Aortic sclerosis without  stenosis. EF 55-60% with normal WM; mild concentric hypertrophy with normal relaxation.  Marland Kitchen CAD (coronary artery disease), autologous vein bypass graft 09/2014   10/03/14: Cath for Class III-IV Angina -- 80% ostial-proximal SVG-RPDA (minimal flow down native PDA from native RCA with 50% proximal and distal, patent LIMA-LAD, SVG-OM with retrograde filling of OM 2. --> 10/12/14: Staged PCI of SVG-RPDA - Xience DES  3.0 mm x 38 mm; (3.4  - 3.3 mm)  . CAD S/P percutaneous coronary angioplasty 06/12/2011; 09/2104   a) 2012: Complex PCI mRCA ([place]) --  2 overlapping Promus element DES stents.; 09/2014:  ost-prox SVG-rPDA - Xience DES 3.0 mm x 38 mm; (3.4  - 3.3 mm)  . CHF (congestive heart failure) ([place])   . Childhood asthma   . CKD (chronic kidney disease), stage III    "mild; related to diabetes" (10/02/2014)  . Complication of anesthesia    "I've had name recall recognition problems since my last anesthesia"  . DM (diabetes mellitus) type II uncontrolled with eye manifestation ([place]) 2013   Diabetic retinopathy with retinal hemorrhages since;, recurrent in August 2014 treated with Avastin shots  . Dyspnea    mainly with exertion  . GERD (gastroesophageal reflux disease)   . Heart murmur    questionable  . High cholesterol   . Hypertension   . Migraine ~ 2004   "once"  . PAD (peripheral artery disease) ([facility])    with claudication  . PONV (postoperative nausea  and vomiting)    s/p tonsillectomy  . 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 07/01/2013 (Cath for Crescendo Unstable Angina) --> [name]: For LM disease; LIMA-LAD, SVG-RCA, SCG-Cx; b. 09/2014 Cath/Staged PCI: patent LIMA->LAD, VG->OM1/RI, VG->RCA 80% (3.0x38 Xience Alpine DES on 10/12/2014).  . 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  Likely thromboembolic event with PDA occlusion. 06/12/2011   Post Complex PCI, pt states no mi    Past Surgical History:  Procedure Laterality Date  . CARDIAC  CATHETERIZATION  06/28/2013   ostial LM disese, RCA ~40-50%ISR  . CAROTID ENDARTERECTOMY Left 05/21/2016  . CORONARY ANGIOPLASTY WITH STENT PLACEMENT Right September 2012   2 overlapping Promus DES 2.7 mm at 12 mm x2; postdilated to 3 mm  . CORONARY ARTERY BYPASS GRAFT N/A 07/01/2013   Procedure: CORONARY ARTERY BYPASS GRAFTING (CABG);  Surgeon: Grace Isaac, MD;  Location: [place];  Service: Open Heart Surgery;  Laterality: N/A;  Times 3 using left internal mammary artery and endoscopically harvested bilateral saphenous vein  . ENDARTERECTOMY Left 05/21/2016   Procedure: LEFT CAROTID ENDARTERECTOMY;  Surgeon: Elam Dutch, MD;  Location: [place];  Service: Vascular;  Laterality: Left;  . EYE SURGERY Bilateral    ioc for cataracts  . INTRAOPERATIVE TRANSESOPHAGEAL ECHOCARDIOGRAM N/A 07/01/2013   Procedure: INTRAOPERATIVE TRANSESOPHAGEAL ECHOCARDIOGRAM;  Surgeon: Grace Isaac, MD;  Location: [place];  Service: Open Heart Surgery;  Laterality: N/A;  . LEFT AND RIGHT HEART CATHETERIZATION WITH CORONARY/GRAFT ANGIOGRAM N/A 10/03/2014   Procedure: LEFT AND RIGHT HEART CATHETERIZATION WITH Beatrix Fetters;  Surgeon: Leonie Man, MD;  [facility];  Service: Cardiovascular;  Laterality: N/A;  . LEFT HEART CATHETERIZATION WITH CORONARY ANGIOGRAM N/A 06/11/2011   Procedure: LEFT HEART CATHETERIZATION WITH CORONARY ANGIOGRAM;  Surgeon: Fulton Reek, MD;  Location: [facility] CATH LAB;  Service: Cardiovascular;  Laterality: N/A;  . LEFT HEART CATHETERIZATION WITH CORONARY ANGIOGRAM N/A 06/28/2013   Procedure: LEFT HEART CATHETERIZATION WITH CORONARY ANGIOGRAM;  Surgeon: Leonie Man, MD;  Location: [facility] CATH LAB;  Service: Cardiovascular;  Laterality: N/A;  . NM MYOVIEW LTD  January '13   Diaphragmatic attenuation versus inferior infarct. No ischemia. Normal EF normal wall motion.  Marland Kitchen NM MYOVIEW LTD  June 2014   no ischemia  . NM MYOVIEW LTD  10/2014   LEXISCAN: Normal EF, 61%, No  ischemia or infarction.  Mild diaphragmatic attenuation  . PATCH ANGIOPLASTY Left 05/21/2016   Procedure: PATCH ANGIOPLASTY USING HEMASHIELD PLATINUM FINESSE 0.3in x 3 in;  Surgeon: Elam Dutch, MD;  Location: [place];  Service: Vascular;  Laterality: Left;  . PERCUTANEOUS CORONARY STENT INTERVENTION (PCI-S)  06/11/2011   Procedure: PERCUTANEOUS CORONARY STENT INTERVENTION (PCI-S);  Surgeon: Fulton Reek, MD;  Location: [facility] CATH LAB;  Service: Cardiovascular;;  . PERCUTANEOUS CORONARY STENT INTERVENTION (PCI-S) N/A 10/12/2014   Procedure: PERCUTANEOUS CORONARY STENT INTERVENTION (PCI-S);  Surgeon: Leonie Man, MD;  Location: [facility] CATH LAB;  Ostial-Prox SVG-RCA Xience Alpine DES 3.0 mm x 38 mm; (3.4  - 3.3 mm)  . RETINAL LASER PROCEDURE Bilateral    "more than once"   . TONSILLECTOMY  as child  . TRANSTHORACIC ECHOCARDIOGRAM  06/29/2013   Focal basal hypertrophy with normal LV size. EF 60-65% no regional WMA. Mild LA dilation  . TRANSTHORACIC ECHOCARDIOGRAM  April 2016   Mod focal basal & mild overall LVH. EF 55-60%. Mild Ao Sclerosis, Mild MR with MAC. Only minimally  elevated RVP.    Current Outpatient Prescriptions  Medication Sig Dispense Refill  . acetaminophen (TYLENOL) 325 MG tablet Take 650 mg by mouth daily as needed for mild pain. Reported on 01/03/2016    . amLODipine (NORVASC) 10 MG tablet TAKE 1 TABLET ONCE DAILY. 30 tablet 6  . aspirin EC 81 MG tablet Take 1 tablet (81 mg total) by mouth every other day. (Patient taking differently: Take by mouth. Take 78m once daily on Tues, Thurs, Sat, and Sun)    . B-D ULTRAFINE III SHORT PEN 31G X 8 MM MISC See admin instructions.  0  . carvedilol (COREG) 12.5 MG tablet TAKE 1 TABLET TWICE DAILY. 60 tablet 10  . clopidogrel (PLAVIX) 75 MG tablet Take 1 tablet (75 mg total) by mouth daily with breakfast. 90 tablet 3  . fenofibrate 160 MG tablet Take 160 mg by mouth daily.    . furosemide (LASIX) 40 MG tablet Take 40-80 mg by mouth 2  (two) times daily. Take 80 mg in the morning and 40 mg in the evening    . HUMALOG KWIKPEN 100 UNIT/ML SOPN Inject 10-15 Units into the skin 3 (three) times daily. Sliding scale  scale = blood sugar level - 100 / 15  (max 15 units)    . hydrALAZINE (APRESOLINE) 100 MG tablet Take 1 tablet (100 mg total) by mouth 3 (three) times daily. 90 tablet 1  . insulin glargine (LANTUS) 100 UNIT/ML injection Inject 40-80 Units into the skin 2 (two) times daily. 40 units in am, 80 units in pm    . isosorbide mononitrate (IMDUR) 60 MG 24 hr tablet Take 1 tablet (60 mg total) by mouth daily. 90 tablet 3  . NITROSTAT 0.4 MG SL tablet DISSOLVE 1 TABLET UNDER TONGUE AS NEEDED FOR CHEST PAIN,MAY REPEAT IN5 MINUTES FOR 2 DOSES. 25 tablet 1  . pantoprazole (PROTONIX) 40 MG tablet Take 40 mg by mouth daily.    . rosuvastatin (CRESTOR) 10 MG tablet TAKE ONE TABLET EVERY OTHER DAY. (Patient taking differently: take 148m daily on tuesday, thursday, saturday and sunday) 45 tablet 1  . VASCEPA 1 G CAPS Take 2 g by mouth 2 (two) times daily.   4   Current Facility-Administered Medications  Medication Dose Route Frequency Provider Last Rate Last Dose  . 0.9 %  sodium chloride infusion   Intravenous Continuous HaLeonie ManMD        Allergies as of 11/05/2016 - Review Complete 11/05/2016  Allergen Reaction Noted  . Atorvastatin Other (See Comments) 03/03/2013  . Ibuprofen Hives 05/27/2011  . Pravastatin  03/03/2013  . Simvastatin Other (See Comments) 03/03/2013    Family History  Problem Relation Age of Onset  . Lung cancer Sister   . Breast cancer Sister   . Breast cancer Mother   . Hyperlipidemia Father   . Hypertension Father   . Throat cancer Father   . Colon polyps Father   . Liver cancer Maternal Grandmother   . Stomach cancer Neg Hx     Social History   Social History  . Marital status: Married    Spouse name: N/A  . Number of children: N/A  . Years of education: N/A   Occupational History   . attorney    Social History Main Topics  . Smoking status: Former Smoker    Packs/day: 2.00    Years: 27.00    Types: Cigarettes    Quit date: 11/23/1996  . Smokeless tobacco: Never Used  .  Alcohol use 0.0 oz/week     Comment: rarely  . Drug use: No  . Sexual activity: Not Currently   Other Topics Concern  . 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 He notably has not been exercising like he used to. He [place] walk [place] and [place] He has social alcoholic beverages but is trying to cut that back and does not smoke.       He tells me he is a sucker for chips but does not eat sweets because of diabetes.    Review of Systems:    Constitutional: No weight loss, fever or chills Skin: No rash  Cardiovascular: No chest pain Respiratory: No SOB  Gastrointestinal: See HPI and otherwise negative Genitourinary: No dysuria Neurological: No headache Musculoskeletal: No new muscle or joint pain Hematologic: No bleeding  Psychiatric: No history of depression or anxiety   Physical Exam:  Vital signs: BP (!) 138/56   Pulse (!) 58   Ht 5' 8.11" (1.73 m)   Wt 210 lb (95.3 kg)   BMI 31.83 kg/m   Constitutional:   Pleasant overweight Caucasian male appears to be in NAD, Well developed, Well nourished, alert and cooperative Head:  Normocephalic and atraumatic. Eyes:   PEERL, EOMI. No icterus. Conjunctiva pink. Ears:  Normal auditory acuity. Neck:  Supple Throat: Oral cavity and pharynx without inflammation, swelling or lesion.  Respiratory: Respirations even and unlabored. Lungs clear to auscultation bilaterally.   No wheezes, crackles, or rhonchi.  Cardiovascular: Normal S1, S2. No MRG. Regular rate and rhythm. No peripheral edema, cyanosis or pallor.  Gastrointestinal:  Soft, nondistended, nontender. No rebound or guarding. Normal bowel sounds. No appreciable masses  or hepatomegaly. Rectal:  Not performed.  Msk:  Symmetrical without gross deformities. Without edema, no deformity or joint abnormality.  Neurologic:  Alert and  oriented x4;  grossly normal neurologically.  Skin:   Dry and intact without significant lesions or rashes. Psychiatric:  Demonstrates good judgement and reason without abnormal affect or behaviors.  MOST RECENT LABS AND IMAGING: CBC    Component Value Date/Time   WBC 9.1 05/22/2016 0548   RBC 3.46 (L) 05/22/2016 0548   HGB 9.6 (L) 05/22/2016 0548   HCT 29.7 (L) 05/22/2016 0548   PLT 221 05/22/2016 0548   MCV 85.8 05/22/2016 0548   MCH 27.7 05/22/2016 0548   MCHC 32.3 05/22/2016 0548   RDW 13.5 05/22/2016 0548   LYMPHSABS 1.4 09/21/2014 1100   MONOABS 0.6 09/21/2014 1100   EOSABS 0.2 09/21/2014 1100   BASOSABS 0.1 09/21/2014 1100    CMP     Component Value Date/Time   NA 136 05/22/2016 0548   K 4.0 05/22/2016 0548   CL 107 05/22/2016 0548   CO2 21 (L) 05/22/2016 0548   GLUCOSE 201 (H) 05/22/2016 0548   BUN 34 (H) 05/22/2016 0548   CREATININE 1.99 (H) 05/22/2016 0548   CREATININE 1.72 (H) 11/03/2014 0958   CALCIUM 8.5 (L) 05/22/2016 0548   PROT 6.7 05/19/2016 1612   ALBUMIN 4.1 05/19/2016 1612   AST 41 05/19/2016 1612   ALT 50 05/19/2016 1612   ALKPHOS 57 05/19/2016 1612   BILITOT 0.4 05/19/2016 1612   GFRNONAA 32 (L) 05/22/2016 0548   GFRNONAA 39 (L) 07/20/2013 1120   GFRAA 37 (L) 05/22/2016 0548   GFRAA 46 (L) 07/20/2013 1120    Assessment:  1. Surveillance colonoscopy due to history of adenomatous polyps: Last colon in 2014 with finding of tubulovillous and tubular adenoma, recommendations for repeat in 3 years, patient is now due 2. Chronic anticoagulation: For CAD status post graft on Plavix  Plan: 1. Scheduled patient for a screening colonoscopy in [place] per Dr. Blanch Media recommendations. Discussed risks, benefits, limitations and alternatives and the patient agrees to proceed. 2. Patient was advised  to hold his Plavix for 5 days prior to his procedure. We will communicate with his cardiologist to ensure that holding his Plavix is acceptable for him. 3. Patient was advised to have only a small amount of clear liquid sugary drink if his sugar drops, he must be nothing by mouth for at least 3 hours prior to his procedure 4. Patient to follow in clinic per Dr. Blanch Media recommendations after time of procedure  Ellouise Newer, PA-C [facility] 11/05/2016, 11:02 AM  Cc: Reynold Bowen, MD

## 2016-11-05 NOTE — Patient Instructions (Signed)
You have been scheduled for a colonoscopy. Please follow written instructions given to you at your visit today.  Please pick up your prep supplies at the pharmacy within the next 1-3 days. If you use inhalers (even only as needed), please bring them with you on the day of your procedure. Your physician has requested that you go to www.startemmi.com and enter the access code given to you at your visit today. This web site gives a general overview about your procedure. However, you should still follow specific instructions given to you by our office regarding your preparation for the procedure.  You will be contacted by our office prior to your procedure for directions on holding your Plavix.  If you do not hear from our office 1 week prior to your scheduled procedure, please call [phone] to discuss.  

## 2016-11-05 NOTE — Progress Notes (Signed)
Assessment and plans noted ?

## 2016-11-06 ENCOUNTER — Other Ambulatory Visit: Payer: Self-pay | Admitting: Cardiology

## 2016-11-06 ENCOUNTER — Ambulatory Visit: Payer: Medicare Other | Admitting: Vascular Surgery

## 2016-11-06 ENCOUNTER — Encounter (HOSPITAL_COMMUNITY): Payer: Medicare Other

## 2016-11-09 NOTE — Telephone Encounter (Signed)
He is fine to stop Plavix 5-7 days preprocedure. Restart morning 2 days post  Glenetta Hew, MD

## 2016-11-10 NOTE — Telephone Encounter (Signed)
Left message on machine to call office.

## 2016-11-12 NOTE — Telephone Encounter (Signed)
Pt informed to stop Plavix 5 days before and restart 2 days after. He verbalized understanding.

## 2016-12-05 ENCOUNTER — Encounter: Payer: Self-pay | Admitting: Vascular Surgery

## 2016-12-15 DIAGNOSIS — N183 Chronic kidney disease, stage 3 (moderate): Secondary | ICD-10-CM | POA: Diagnosis not present

## 2016-12-15 DIAGNOSIS — I739 Peripheral vascular disease, unspecified: Secondary | ICD-10-CM | POA: Diagnosis not present

## 2016-12-15 DIAGNOSIS — I10 Essential (primary) hypertension: Secondary | ICD-10-CM | POA: Diagnosis not present

## 2016-12-15 DIAGNOSIS — Z6831 Body mass index (BMI) 31.0-31.9, adult: Secondary | ICD-10-CM | POA: Diagnosis not present

## 2016-12-15 DIAGNOSIS — E119 Type 2 diabetes mellitus without complications: Secondary | ICD-10-CM | POA: Diagnosis not present

## 2016-12-15 DIAGNOSIS — I2510 Atherosclerotic heart disease of native coronary artery without angina pectoris: Secondary | ICD-10-CM | POA: Diagnosis not present

## 2016-12-18 ENCOUNTER — Ambulatory Visit (INDEPENDENT_AMBULATORY_CARE_PROVIDER_SITE_OTHER)
Admission: RE | Admit: 2016-12-18 | Discharge: 2016-12-18 | Disposition: A | Discharge status: Home / Self Care | Payer: Medicare Other | Source: Ambulatory Visit | Attending: Vascular Surgery | Admitting: Vascular Surgery

## 2016-12-18 ENCOUNTER — Ambulatory Visit (HOSPITAL_COMMUNITY)
Admission: RE | Admit: 2016-12-18 | Discharge: 2016-12-18 | Disposition: A | Discharge status: Home / Self Care | Payer: Medicare Other | Source: Ambulatory Visit | Attending: Vascular Surgery | Admitting: Vascular Surgery

## 2016-12-18 ENCOUNTER — Encounter: Payer: Self-pay | Admitting: Vascular Surgery

## 2016-12-18 ENCOUNTER — Ambulatory Visit (INDEPENDENT_AMBULATORY_CARE_PROVIDER_SITE_OTHER): Payer: Medicare Other | Admitting: Vascular Surgery

## 2016-12-18 ENCOUNTER — Other Ambulatory Visit: Payer: Self-pay | Admitting: Vascular Surgery

## 2016-12-18 VITALS — BP 143/66 | HR 51 | Temp 98.2°F | Resp 20 | Ht 68.11 in | Wt 212.0 lb

## 2016-12-18 DIAGNOSIS — I6523 Occlusion and stenosis of bilateral carotid arteries: Secondary | ICD-10-CM

## 2016-12-18 DIAGNOSIS — I739 Peripheral vascular disease, unspecified: Secondary | ICD-10-CM | POA: Diagnosis not present

## 2016-12-18 DIAGNOSIS — I209 Angina pectoris, unspecified: Secondary | ICD-10-CM | POA: Diagnosis not present

## 2016-12-18 NOTE — Progress Notes (Signed)
Patient is a 71 year old male who returns for follow-up today. We have been following him for peripheral arterial and carotid occlusive disease. He denies any symptoms of TIA amaurosis or stroke. He has had long-standing short distance claudication. However, he has not had rest pain or tissue loss. He has been reluctant to proceed with arteriography in the past due to renal dysfunction and concerns about contrast nephropathy. He continues to currently wish to opt for conservative measures with walking program alone due to concerns about renal failure. He states that he is able to walk some but has to rest about every 5 minutes. He thinks that his quality of life is slightly diminished by this but not too much of a burden for him.  He is on Plavix aspirin on a statin.  Current Outpatient Prescriptions on File Prior to Visit  Medication Sig Dispense Refill  . acetaminophen (TYLENOL) 325 MG tablet Take 650 mg by mouth daily as needed for mild pain. Reported on 01/03/2016    . amLODipine (NORVASC) 10 MG tablet TAKE 1 TABLET ONCE DAILY. 30 tablet 6  . aspirin EC 81 MG tablet Take 1 tablet (81 mg total) by mouth every other day. (Patient taking differently: Take by mouth. Take 10m once daily on Tues, Thurs, Sat, and Sun)    . B-D ULTRAFINE III SHORT PEN 31G X 8 MM MISC See admin instructions.  0  . carvedilol (COREG) 12.5 MG tablet TAKE 1 TABLET TWICE DAILY. 60 tablet 10  . clopidogrel (PLAVIX) 75 MG tablet TAKE 1 TABLET WITH BREAKFAST. 90 tablet 0  . fenofibrate 160 MG tablet Take 160 mg by mouth daily.    . furosemide (LASIX) 40 MG tablet Take 40-80 mg by mouth 2 (two) times daily. Take 80 mg in the morning and 40 mg in the evening    . HUMALOG KWIKPEN 100 UNIT/ML SOPN Inject 10-15 Units into the skin 3 (three) times daily. Sliding scale  scale = blood sugar level - 100 / 15  (max 15 units)    . hydrALAZINE (APRESOLINE) 100 MG tablet Take 1 tablet (100 mg total) by mouth 3 (three) times daily. 90 tablet 1   . insulin glargine (LANTUS) 100 UNIT/ML injection Inject 40-80 Units into the skin 2 (two) times daily. 40 units in am, 80 units in pm    . isosorbide mononitrate (IMDUR) 60 MG 24 hr tablet Take 1 tablet (60 mg total) by mouth daily. 90 tablet 3  . Na Sulfate-K Sulfate-Mg Sulf (SUPREP BOWEL PREP KIT) 17.5-3.13-1.6 GM/180ML SOLN Take 1 kit by mouth as directed. 324 mL 0  . NITROSTAT 0.4 MG SL tablet DISSOLVE 1 TABLET UNDER TONGUE AS NEEDED FOR CHEST PAIN,MAY REPEAT IN5 MINUTES FOR 2 DOSES. 25 tablet 1  . pantoprazole (PROTONIX) 40 MG tablet Take 40 mg by mouth daily.    . rosuvastatin (CRESTOR) 10 MG tablet TAKE ONE TABLET EVERY OTHER DAY. (Patient taking differently: take 131m daily on tuesday, thursday, saturday and sunday) 45 tablet 1  . VASCEPA 1 G CAPS Take 2 g by mouth 2 (two) times daily.   4   Current Facility-Administered Medications on File Prior to Visit  Medication Dose Route Frequency Provider Last Rate Last Dose  . 0.9 %  sodium chloride infusion   Intravenous Continuous HaLeonie ManMD       Review of systems: He denies chest pain. However, he does have shortness of breath with exertion about one to 2 flights of stairs. He states that  his shortness of breath may be a little bit worse than it was a year ago. He states that he does have intermittent follow-up with his cardiologist.  Physical exam:  Vitals:   12/18/16 1404  BP: (!) 143/66  Pulse: (!) 51  Resp: 20  Temp: 98.2 F (36.8 C)  TempSrc: Oral  SpO2: 93%  Weight: 212 lb (96.2 kg)  Height: 5' 8.11" (1.73 m)    Neck: No carotid bruits. Chest: Clear to auscultation bilaterally  Cardiac: Regular rate and rhythm without murmur  Extremities: 2+ femoral pulses absent popliteal and pedal pulses bilaterally no open wounds or ulcerations.  Data: Patient had bilateral ABIs performed today. 0.36 on the left 0.51 on the right essentially consistent with prior ABIs from July 2017. Duplex ultrasound in the past had  shown bilateral 75-99% superficial femoral artery occlusive disease.  Bilateral carotid duplex scan showed no significant carotid occlusive disease.  Assessment: Doing well status post carotid endarterectomy. No significant recurrent disease. Continue Plavix and aspirin.  As far as his peripheral arterial disease is concerned. He has bilateral superficial femoral artery subtotal occlusions. I discussed with the patient today the possibility of arteriogram but we also discussed the possibility of contrast nephropathy and he has opted for conservative measures currently. He'll follow-up in 6 months time with repeat ABIs and we will repeat his carotid duplex scan a year. He will follow-up sooner if he develops any symptoms of TIA amaurosis or stroke rest pain or worsening claudication symptoms.  I also did tell him to discuss with his cardiologist at his next office visit the shortness of breath and they can make a determination whether or not this is clinically relevant.  Ruta Hinds, MD [facility] Office: [phone] Pager: [phone]

## 2016-12-19 ENCOUNTER — Other Ambulatory Visit: Payer: Self-pay | Admitting: Cardiology

## 2016-12-19 NOTE — Telephone Encounter (Signed)
Rx(s) sent to pharmacy electronically.  

## 2016-12-25 ENCOUNTER — Ambulatory Visit (AMBULATORY_SURGERY_CENTER): Payer: Medicare Other | Admitting: Internal Medicine

## 2016-12-25 ENCOUNTER — Encounter: Payer: Self-pay | Admitting: Internal Medicine

## 2016-12-25 VITALS — BP 140/45 | HR 52 | Temp 98.6°F | Resp 13 | Ht 68.0 in | Wt 210.0 lb

## 2016-12-25 DIAGNOSIS — K219 Gastro-esophageal reflux disease without esophagitis: Secondary | ICD-10-CM | POA: Diagnosis not present

## 2016-12-25 DIAGNOSIS — Z86010 Personal history of colonic polyps: Secondary | ICD-10-CM

## 2016-12-25 DIAGNOSIS — Z1211 Encounter for screening for malignant neoplasm of colon: Secondary | ICD-10-CM | POA: Diagnosis not present

## 2016-12-25 DIAGNOSIS — E119 Type 2 diabetes mellitus without complications: Secondary | ICD-10-CM | POA: Diagnosis not present

## 2016-12-25 DIAGNOSIS — I510 Cardiac septal defect, acquired: Secondary | ICD-10-CM | POA: Diagnosis not present

## 2016-12-25 DIAGNOSIS — I10 Essential (primary) hypertension: Secondary | ICD-10-CM | POA: Diagnosis not present

## 2016-12-25 MED ORDER — FLEET ENEMA 7-19 GM/118ML RE ENEM
1.0000 | ENEMA | Freq: Once | RECTAL | Status: AC
  Administered 2016-12-25: 1 via RECTAL

## 2016-12-25 MED ORDER — SODIUM CHLORIDE 0.9 % IV SOLN: 500.0000 mL | INTRAVENOUS | Status: DC

## 2016-12-25 NOTE — Progress Notes (Signed)
Report to PACU, RN, vss, BBS= Clear.  

## 2016-12-25 NOTE — Op Note (Signed)
[place] Patient Name: Lance Collier Procedure Date: 12/25/2016 2:35 PM MRN: [id number] Endoscopist: Docia Chuck. Henrene Pastor , MD Age: 71 Referring MD:  Date of Birth: 1946/04/02 Gender: Male Account #: [id number] Procedure:                Colonoscopy Indications:              High risk colon cancer surveillance: Personal                            history of adenoma with villous component, High                            risk colon cancer surveillance: Personal history of                            multiple (3 or more) adenomas. Previous                            colonoscopies elsewhere. Most recent 2011 and 2014 Medicines:                Monitored Anesthesia Care Procedure:                Pre-Anesthesia Assessment:                           - Prior to the procedure, a History and Physical                            was performed, and patient medications and                            allergies were reviewed. The patient's tolerance of                            previous anesthesia was also reviewed. The risks                            and benefits of the procedure and the sedation                            options and risks were discussed with the patient.                            All questions were answered, and informed consent                            was obtained. Prior Anticoagulants: The patient has                            taken Plavix (clopidogrel), last dose was 5 days                            prior to procedure. ASA Grade Assessment: III - A  patient with severe systemic disease. After                            reviewing the risks and benefits, the patient was                            deemed in satisfactory condition to undergo the                            procedure.                           After obtaining informed consent, the colonoscope                            was passed under direct vision. Throughout the                procedure, the patient's blood pressure, pulse, and                            oxygen saturations were monitored continuously. The                            Colonoscope was introduced through the anus and                            advanced to the the cecum, identified by the                            ileocecal valve. The ileocecal valve was                            photographed. The quality of the bowel preparation                            was poor. The colonoscopy was performed without                            difficulty. The patient tolerated the procedure                            well. The bowel preparation used was SUPREP. The                            patient was given cleansing enemas prior to the                            procedure after reporting brown liquid stool Scope In: Scope Out: Findings:                 The colonic preparation was poor with turbid dense                            dark brown liquid stool throughout. There were no  gross lesions identified such as obstructing mass.                            Could not exclude anything more subtle. Complications:            No immediate complications. Estimated blood loss:                            None. Estimated Blood Loss:     Estimated blood loss: none. Impression:               - Preparation of the colon was poor, compromising                            examination.                           - No gross abnormalities.                           - No specimens collected. Recommendation:           - Repeat colonoscopy at the next available                            appointment for surveillance. The patient will need                            more extensive preparation with 2 days of clear                            liquids, magnesium citrate the day before the                            procedure to be followed that evening by standard                            split  prep.                           - Patient has a contact number available for                            emergencies. The signs and symptoms of potential                            delayed complications were discussed with the                            patient. Return to normal activities tomorrow.                            Written discharge instructions were provided to the                            patient.                           -  Resume previous diet.                           - Continue present medications.                           - RESUME Plavix today [name] N. Henrene Pastor, MD 12/25/2016 3:38:22 PM This report has been signed electronically.

## 2016-12-25 NOTE — Patient Instructions (Signed)
YOU HAD AN ENDOSCOPIC PROCEDURE TODAY AT [facility]:   Refer to the procedure report that was given to you for any specific questions about what was found during the examination.  If the procedure report does not answer your questions, please call your gastroenterologist to clarify.  If you requested that your care partner not be given the details of your procedure findings, then the procedure report has been included in a sealed envelope for you to review at your convenience later.  YOU SHOULD EXPECT: Some feelings of bloating in the abdomen. Passage of more gas than usual.  Walking can help get rid of the air that was put into your GI tract during the procedure and reduce the bloating. If you had a lower endoscopy (such as a colonoscopy or flexible sigmoidoscopy) you may notice spotting of blood in your stool or on the toilet paper. If you underwent a bowel prep for your procedure, you may not have a normal bowel movement for a few days.  Please Note:  You might notice some irritation and congestion in your nose or some drainage.  This is from the oxygen used during your procedure.  There is no need for concern and it should clear up in a day or so.  SYMPTOMS TO REPORT IMMEDIATELY:   Following lower endoscopy (colonoscopy or flexible sigmoidoscopy):  Excessive amounts of blood in the stool  Significant tenderness or worsening of abdominal pains  Swelling of the abdomen that is new, acute  Fever of 100F or higher   For urgent or emergent issues, a gastroenterologist can be reached at any hour by calling [phone].   DIET:  We do recommend a small meal at first, but then you may proceed to your regular diet.  Drink plenty of fluids but you should avoid alcoholic beverages for 24 hours.  ACTIVITY:  You should plan to take it easy for the rest of today and you should NOT DRIVE or use heavy machinery until tomorrow (because of the sedation medicines used during the test).     FOLLOW UP: Our staff will call the number listed on your records the next business day following your procedure to check on you and address any questions or concerns that you may have regarding the information given to you following your procedure. If we do not reach you, we will leave a message.  However, if you are feeling well and you are not experiencing any problems, there is no need to return our call.  We will assume that you have returned to your regular daily activities without incident.  If any biopsies were taken you will be contacted by phone or by letter within the next 1-3 weeks.  Please call us at [phone] if you have not heard about the biopsies in 3 weeks.   Repeat Colonoscopy screening will need a more extensive prep  With 2 days of clear liquids, magnesium citrate the day before the procedure to be followed that eveningby standard split prep.  SIGNATURES/CONFIDENTIALITY: You and/or your care partner have signed paperwork which will be entered into your electronic medical record.  These signatures attest to the fact that that the information above on your After Visit Summary has been reviewed and is understood.  Full responsibility of the confidentiality of this discharge information lies with you and/or your care-partner.

## 2016-12-26 ENCOUNTER — Telehealth: Payer: Self-pay

## 2016-12-26 NOTE — Telephone Encounter (Signed)
  Follow up Call-  Call back number 12/25/2016  Post procedure Call Back phone  # [phone]  Permission to leave phone message Yes  Some recent data might be hidden     Patient questions:  Do you have a fever, pain , or abdominal swelling? No. Pain Score  0 *  Have you tolerated food without any problems? Yes.    Have you been able to return to your normal activities? Yes.    Do you have any questions about your discharge instructions: Diet   No. Medications  No. Follow up visit  No.  Do you have questions or concerns about your Care? Yes.    Actions: * If pain score is 4 or above: No action needed, pain <4.  Pt given time for next appointment.  No problems noted per pt. maw

## 2016-12-30 ENCOUNTER — Ambulatory Visit (AMBULATORY_SURGERY_CENTER): Payer: Self-pay

## 2016-12-30 VITALS — Ht 68.0 in | Wt 215.0 lb

## 2016-12-30 DIAGNOSIS — Z86010 Personal history of colonic polyps: Secondary | ICD-10-CM

## 2016-12-30 DIAGNOSIS — Z8371 Family history of colonic polyps: Secondary | ICD-10-CM

## 2016-12-30 MED ORDER — NA SULFATE-K SULFATE-MG SULF 17.5-3.13-1.6 GM/177ML PO SOLN: ORAL | 0 refills | Status: DC

## 2016-12-30 NOTE — Progress Notes (Signed)
Per pt, no allergies to soy or egg products.Pt not taking any weight loss meds or using  O2 at home.   Pt refused Emmi video. 

## 2016-12-31 NOTE — Addendum Note (Signed)
Addended by: Lianne Cure A on: 12/31/2016 02:27 PM   Modules accepted: Orders

## 2017-01-06 ENCOUNTER — Encounter: Payer: Self-pay | Admitting: Internal Medicine

## 2017-01-06 ENCOUNTER — Ambulatory Visit (AMBULATORY_SURGERY_CENTER): Payer: Medicare Other | Admitting: Internal Medicine

## 2017-01-06 VITALS — BP 134/53 | HR 51 | Temp 98.0°F | Resp 10 | Ht 68.0 in | Wt 215.0 lb

## 2017-01-06 DIAGNOSIS — Z86010 Personal history of colonic polyps: Secondary | ICD-10-CM | POA: Diagnosis not present

## 2017-01-06 DIAGNOSIS — Z1211 Encounter for screening for malignant neoplasm of colon: Secondary | ICD-10-CM

## 2017-01-06 DIAGNOSIS — D124 Benign neoplasm of descending colon: Secondary | ICD-10-CM | POA: Diagnosis not present

## 2017-01-06 DIAGNOSIS — D122 Benign neoplasm of ascending colon: Secondary | ICD-10-CM | POA: Diagnosis not present

## 2017-01-06 DIAGNOSIS — D123 Benign neoplasm of transverse colon: Secondary | ICD-10-CM

## 2017-01-06 MED ORDER — SODIUM CHLORIDE 0.9 % IV SOLN: 500.0000 mL | INTRAVENOUS | Status: AC

## 2017-01-06 NOTE — Patient Instructions (Signed)
YOU HAD AN ENDOSCOPIC PROCEDURE TODAY AT [facility]:   Refer to the procedure report that was given to you for any specific questions about what was found during the examination.  If the procedure report does not answer your questions, please call your gastroenterologist to clarify.  If you requested that your care partner not be given the details of your procedure findings, then the procedure report has been included in a sealed envelope for you to review at your convenience later.  YOU SHOULD EXPECT: Some feelings of bloating in the abdomen. Passage of more gas than usual.  Walking can help get rid of the air that was put into your GI tract during the procedure and reduce the bloating. If you had a lower endoscopy (such as a colonoscopy or flexible sigmoidoscopy) you may notice spotting of blood in your stool or on the toilet paper. If you underwent a bowel prep for your procedure, you may not have a normal bowel movement for a few days.  Please Note:  You might notice some irritation and congestion in your nose or some drainage.  This is from the oxygen used during your procedure.  There is no need for concern and it should clear up in a day or so.  SYMPTOMS TO REPORT IMMEDIATELY:   Following lower endoscopy (colonoscopy or flexible sigmoidoscopy):  Excessive amounts of blood in the stool  Significant tenderness or worsening of abdominal pains  Swelling of the abdomen that is new, acute  Fever of 100F or higher   For urgent or emergent issues, a gastroenterologist can be reached at any hour by calling [phone].   DIET:  We do recommend a small meal at first, but then you may proceed to your regular diet.  Drink plenty of fluids but you should avoid alcoholic beverages for 24 hours.  ACTIVITY:  You should plan to take it easy for the rest of today and you should NOT DRIVE or use heavy machinery until tomorrow (because of the sedation medicines used during the test).     FOLLOW UP: Our staff will call the number listed on your records the next business day following your procedure to check on you and address any questions or concerns that you may have regarding the information given to you following your procedure. If we do not reach you, we will leave a message.  However, if you are feeling well and you are not experiencing any problems, there is no need to return our call.  We will assume that you have returned to your regular daily activities without incident.  If any biopsies were taken you will be contacted by phone or by letter within the next 1-3 weeks.  Please call us at [phone] if you have not heard about the biopsies in 3 weeks.    SIGNATURES/CONFIDENTIALITY: You and/or your care partner have signed paperwork which will be entered into your electronic medical record.  These signatures attest to the fact that that the information above on your After Visit Summary has been reviewed and is understood.  Full responsibility of the confidentiality of this discharge information lies with you and/or your care-partner.  Polyp and hemorrhoid information given.  Resume Plavix today.

## 2017-01-06 NOTE — Op Note (Signed)
[place] Patient Name: Lance Collier Procedure Date: 01/06/2017 1:25 PM MRN: [id number] Endoscopist: Docia Chuck. Henrene Pastor , MD Age: 71 Referring MD:  Date of Birth: 12-Feb-1946 Gender: Male Account #: [id number] Procedure:                Colonoscopy, with cold snare polypect. x 14.                            Extended time & service (14 polyps) Indications:              High risk colon cancer surveillance: Personal                            history of adenoma with villous component, High                            risk colon cancer surveillance: Personal history of                            multiple (3 or more) adenomas Medicines:                Monitored Anesthesia Care Procedure:                Pre-Anesthesia Assessment:                           - Prior to the procedure, a History and Physical                            was performed, and patient medications and                            allergies were reviewed. The patient's tolerance of                            previous anesthesia was also reviewed. The risks                            and benefits of the procedure and the sedation                            options and risks were discussed with the patient.                            All questions were answered, and informed consent                            was obtained. Prior Anticoagulants: The patient has                            taken no previous anticoagulant or antiplatelet                            agents. ASA Grade Assessment: II - A patient with  mild systemic disease. After reviewing the risks                            and benefits, the patient was deemed in                            satisfactory condition to undergo the procedure.                           After obtaining informed consent, the colonoscope                            was passed under direct vision. Throughout the                            procedure, the  patient's blood pressure, pulse, and                            oxygen saturations were monitored continuously. The                            Model CF-HQ190L [id number]) scope was introduced                            through the anus and advanced to the the cecum,                            identified by appendiceal orifice and ileocecal                            valve. The ileocecal valve, appendiceal orifice,                            and rectum were photographed. The quality of the                            bowel preparation was good. The colonoscopy was                            performed without difficulty. The patient tolerated                            the procedure well. The bowel preparation used was                            SUPREP. Scope In: 1:27:01 PM Scope Out: 1:54:54 PM Scope Withdrawal Time: 0 hours 23 minutes 40 seconds  Total Procedure Duration: 0 hours 27 minutes 53 seconds  Findings:                 14 polyps were found in the descending colon,                            transverse colon, hepatic flexure and ascending  colon. The polyps were 2 to 5 mm in size. These                            polyps were removed with a cold snare. Resection                            and retrieval were complete.                           Internal hemorrhoids were found during retroflexion.                           The exam was otherwise without abnormality on                            direct and retroflexion views. Complications:            No immediate complications. Estimated blood loss:                            None. Estimated Blood Loss:     Estimated blood loss: none. Impression:               - Multiple 2 to 5 mm polyps in the descending                            colon, in the transverse colon, at the hepatic                            flexure and in the ascending colon, removed with a                            cold snare. Resected and  retrieved.                           - Internal hemorrhoids.                           - The examination was otherwise normal on direct                            and retroflexion views. Recommendation:           - Repeat colonoscopy in 1 year for surveillance.                           - PATIENT will require 2 DAY PREP like today.                           - Resume previous diet.                           - Continue present medications.                           - Await pathology results. Docia Chuck. Henrene Pastor, MD  01/06/2017 2:06:00 PM This report has been signed electronically.

## 2017-01-06 NOTE — Progress Notes (Signed)
Called to room to assist during endoscopic procedure.  Patient ID and intended procedure confirmed with present staff. Received instructions for my participation in the procedure from the performing physician.  

## 2017-01-06 NOTE — Progress Notes (Signed)
Spontaneous respirations throughout. VSS. Resting comfortably. To PACU on room air. Report to  [employer].

## 2017-01-07 ENCOUNTER — Telehealth: Payer: Self-pay | Admitting: *Deleted

## 2017-01-07 NOTE — Telephone Encounter (Signed)
  Follow up Call-  Call back number 01/06/2017 12/25/2016  Post procedure Call Back phone  # [phone]  Permission to leave phone message Yes Yes  Some recent data might be hidden    [facility]

## 2017-01-07 NOTE — Telephone Encounter (Signed)
  Follow up Call-  Call back number 01/06/2017 12/25/2016  Post procedure Call Back phone  # [phone]  Permission to leave phone message Yes Yes  Some recent data might be hidden     Patient questions:  Unable to call out.  Will try again later.

## 2017-01-08 DIAGNOSIS — I10 Essential (primary) hypertension: Secondary | ICD-10-CM | POA: Diagnosis not present

## 2017-01-08 DIAGNOSIS — E1139 Type 2 diabetes mellitus with other diabetic ophthalmic complication: Secondary | ICD-10-CM | POA: Diagnosis not present

## 2017-01-08 DIAGNOSIS — N183 Chronic kidney disease, stage 3 (moderate): Secondary | ICD-10-CM | POA: Diagnosis not present

## 2017-01-13 ENCOUNTER — Encounter: Payer: Self-pay | Admitting: Internal Medicine

## 2017-02-11 DIAGNOSIS — Z23 Encounter for immunization: Secondary | ICD-10-CM | POA: Diagnosis not present

## 2017-02-11 DIAGNOSIS — I10 Essential (primary) hypertension: Secondary | ICD-10-CM | POA: Diagnosis not present

## 2017-02-11 DIAGNOSIS — I6529 Occlusion and stenosis of unspecified carotid artery: Secondary | ICD-10-CM | POA: Diagnosis not present

## 2017-02-11 DIAGNOSIS — I25810 Atherosclerosis of coronary artery bypass graft(s) without angina pectoris: Secondary | ICD-10-CM | POA: Diagnosis not present

## 2017-02-11 DIAGNOSIS — D649 Anemia, unspecified: Secondary | ICD-10-CM | POA: Diagnosis not present

## 2017-02-11 DIAGNOSIS — E784 Other hyperlipidemia: Secondary | ICD-10-CM | POA: Diagnosis not present

## 2017-02-11 DIAGNOSIS — E1139 Type 2 diabetes mellitus with other diabetic ophthalmic complication: Secondary | ICD-10-CM | POA: Diagnosis not present

## 2017-02-11 DIAGNOSIS — I7389 Other specified peripheral vascular diseases: Secondary | ICD-10-CM | POA: Diagnosis not present

## 2017-02-11 DIAGNOSIS — E113599 Type 2 diabetes mellitus with proliferative diabetic retinopathy without macular edema, unspecified eye: Secondary | ICD-10-CM | POA: Diagnosis not present

## 2017-02-11 DIAGNOSIS — Z1389 Encounter for screening for other disorder: Secondary | ICD-10-CM | POA: Diagnosis not present

## 2017-02-11 DIAGNOSIS — Z6832 Body mass index (BMI) 32.0-32.9, adult: Secondary | ICD-10-CM | POA: Diagnosis not present

## 2017-02-11 DIAGNOSIS — R946 Abnormal results of thyroid function studies: Secondary | ICD-10-CM | POA: Diagnosis not present

## 2017-02-17 ENCOUNTER — Other Ambulatory Visit: Payer: Self-pay | Admitting: Cardiology

## 2017-03-08 ENCOUNTER — Other Ambulatory Visit: Payer: Self-pay | Admitting: Cardiology

## 2017-03-11 ENCOUNTER — Encounter: Payer: Self-pay | Admitting: Cardiology

## 2017-03-11 ENCOUNTER — Ambulatory Visit (INDEPENDENT_AMBULATORY_CARE_PROVIDER_SITE_OTHER): Payer: Medicare Other | Admitting: Cardiology

## 2017-03-11 VITALS — BP 142/59 | HR 63 | Ht 68.5 in | Wt 218.0 lb

## 2017-03-11 DIAGNOSIS — R0609 Other forms of dyspnea: Secondary | ICD-10-CM

## 2017-03-11 DIAGNOSIS — R0600 Dyspnea, unspecified: Secondary | ICD-10-CM

## 2017-03-11 DIAGNOSIS — I739 Peripheral vascular disease, unspecified: Secondary | ICD-10-CM

## 2017-03-11 DIAGNOSIS — R011 Cardiac murmur, unspecified: Secondary | ICD-10-CM

## 2017-03-11 DIAGNOSIS — I5032 Chronic diastolic (congestive) heart failure: Secondary | ICD-10-CM

## 2017-03-11 DIAGNOSIS — I2510 Atherosclerotic heart disease of native coronary artery without angina pectoris: Secondary | ICD-10-CM

## 2017-03-11 DIAGNOSIS — I209 Angina pectoris, unspecified: Secondary | ICD-10-CM

## 2017-03-11 DIAGNOSIS — E669 Obesity, unspecified: Secondary | ICD-10-CM

## 2017-03-11 DIAGNOSIS — N183 Chronic kidney disease, stage 3 unspecified: Secondary | ICD-10-CM

## 2017-03-11 DIAGNOSIS — Z9861 Coronary angioplasty status: Secondary | ICD-10-CM

## 2017-03-11 DIAGNOSIS — I70219 Atherosclerosis of native arteries of extremities with intermittent claudication, unspecified extremity: Secondary | ICD-10-CM | POA: Diagnosis not present

## 2017-03-11 DIAGNOSIS — I25709 Atherosclerosis of coronary artery bypass graft(s), unspecified, with unspecified angina pectoris: Secondary | ICD-10-CM | POA: Diagnosis not present

## 2017-03-11 DIAGNOSIS — R600 Localized edema: Secondary | ICD-10-CM

## 2017-03-11 DIAGNOSIS — E785 Hyperlipidemia, unspecified: Secondary | ICD-10-CM | POA: Diagnosis not present

## 2017-03-11 MED ORDER — CARVEDILOL 12.5 MG PO TABS: 12.5000 mg | ORAL_TABLET | Freq: Two times a day (BID) | ORAL | 3 refills | Status: DC

## 2017-03-11 MED ORDER — FENOFIBRATE 160 MG PO TABS: 160.0000 mg | ORAL_TABLET | Freq: Every day | ORAL | 3 refills | Status: DC

## 2017-03-11 MED ORDER — ROSUVASTATIN CALCIUM 10 MG PO TABS: ORAL_TABLET | ORAL | 3 refills | Status: DC

## 2017-03-11 MED ORDER — PANTOPRAZOLE SODIUM 40 MG PO TBEC: 40.0000 mg | DELAYED_RELEASE_TABLET | Freq: Every day | ORAL | 3 refills | Status: DC

## 2017-03-11 MED ORDER — FUROSEMIDE 40 MG PO TABS: ORAL_TABLET | ORAL | 3 refills | Status: DC

## 2017-03-11 NOTE — Progress Notes (Signed)
PCP: Reynold Bowen, MD  Clinic Note: Chief Complaint  Patient presents with  . Follow-up    6 MONTHS;  . Coronary Artery Disease    with HFPEF,. PAD & CKD IV    HPI: Lance Collier is a 71 y.o. male with a PMH below who presents today for Six-month follow-up for CAD-PCI-CABG and diastolic heart failure. He also has significant PAD that is pretty much non-revascularizable. He is limited a lot by his PAD.  Lance Collier was last seen on 09/11/2016.  He stated that he is doing relatively well from a cardiac standpoint. Was trying to walk 1-2 days a week but noticing issues. Limited walking due to claudication symptoms. Otherwise doing relatively well. Just lethargic because of leg discomfort making it hard to things.  Recent Hospitalizations: None  Studies Personally Reviewed - (if available, images/films reviewed: From Epic Chart or Care Everywhere)  Carotid Dopplers performed by vascular surgery. Reviewed in [place] - right internal carotid artery velocities suggest less than 40% stenosis. Assess was left carotid endarterectomy with mild hyperplasia (less than 39%.  Interval History: Lance Collier presents today for follow-up noticing more than usual shortness of breath and orthopnea. Occasionally he even has PND symptoms. He is noticing a little more swelling than usual and a little bit more shortness of breath than usual. He occasionally has symptoms that would suggest PND but no significant orthopnea. More edema also. He also denies any significant irregular heartbeats. Just a few skipped beats here and there. Nothing like an arrhythmia according to him. No melena, hematochezia or hematuria.  No chest pain with rest or exertion.  No weakness or syncope/near syncope, TIA/amaurosis / fugax symptoms. No melena, hematochezia, hematuria, or epstaxis. No claudication.  ROS: A comprehensive was performed. ROS activity can do most claudication.).  Cardiovascular: Positive for claudication  (Lifestyle limiting).  Gastrointestinal: Negative for heartburn and nausea.  Musculoskeletal: Positive for back pain and joint pain.  Neurological: Positive for dizziness (Overall better. Just balance issues from peripheral neuropathy).  Psychiatric/Behavioral: The patient has insomnia   I have reviewed and (if needed) personally updated the patient's problem list, medications, allergies, past medical and surgical history, social and family history.   Past Medical History:  Diagnosis Date  . Anemia   . Aortic sclerosis  - without Stenosis  March 2014   Echo: Aortic sclerosis without stenosis. EF 55-60% with normal WM; mild concentric hypertrophy with normal relaxation.  Marland Kitchen CAD (coronary artery disease), autologous vein bypass graft 09/2014   10/03/14: Cath for Class III-IV Angina -- 80% ostial-proximal SVG-RPDA (minimal flow down native PDA from native RCA with 50% proximal and distal, patent LIMA-LAD, SVG-OM with retrograde filling of OM 2. --> 10/12/14: Staged PCI of SVG-RPDA - Xience DES  3.0 mm x 38 mm; (3.4  - 3.3 mm)  . CAD S/P percutaneous coronary angioplasty 06/12/2011; 09/2104   a) 2012: Complex PCI mRCA ([place]) --  2 overlapping Promus element DES stents.; 09/2014:  ost-prox SVG-rPDA - Xience DES 3.0 mm x 38 mm; (3.4  - 3.3 mm)  . CHF (congestive heart failure) ([place])   . Childhood asthma   . CKD (chronic kidney disease), stage III    "mild; related to diabetes" (10/02/2014)  . Complication of anesthesia    "I've had name recall recognition problems since my last anesthesia"  . DM (diabetes mellitus) type II uncontrolled with eye manifestation ([place]) 2013   Diabetic retinopathy with retinal hemorrhages since;, recurrent in August 2014 treated with Avastin  shots  . Dyspnea    mainly with exertion  . GERD (gastroesophageal reflux disease)   . Heart murmur    questionable  . High cholesterol   . Hypertension   . Migraine ~ 2004   "once"  . PAD (peripheral artery disease) (HCC)     with claudication  . PONV (postoperative nausea and vomiting)    s/p tonsillectomy  . 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 07/01/2013 (Cath for [place] Unstable Angina) --> [name]: For LM disease; LIMA-LAD, SVG-RCA, SCG-Cx; b. 09/2014 Cath/Staged PCI: patent LIMA->LAD, VG->OM1/RI, VG->RCA 80% (3.0x38 Xience Alpine DES on 10/12/2014).  . 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  Likely thromboembolic event with PDA occlusion. 06/12/2011   Post Complex PCI, pt states no mi    Past Surgical History:  Procedure Laterality Date  . CARDIAC CATHETERIZATION  06/28/2013   ostial LM disese, RCA ~40-50%ISR  . CAROTID ENDARTERECTOMY Left 05/21/2016  . COLONOSCOPY    . CORONARY ANGIOPLASTY WITH STENT PLACEMENT Right September 2012   2 overlapping Promus DES 2.7 mm at 12 mm x2; postdilated to 3 mm  . CORONARY ARTERY BYPASS GRAFT N/A 07/01/2013   Procedure: CORONARY ARTERY BYPASS GRAFTING (CABG);  Surgeon: Grace Isaac, MD;  Location: [facility];  Service: Open Heart Surgery;  Laterality: N/A;  Times 3 using left internal mammary artery and endoscopically harvested bilateral saphenous vein  . ENDARTERECTOMY Left 05/21/2016   Procedure: LEFT CAROTID ENDARTERECTOMY;  Surgeon: Elam Dutch, MD;  Location: [facility];  Service: Vascular;  Laterality: Left;  . EYE SURGERY Bilateral    ioc for cataracts  . INTRAOPERATIVE TRANSESOPHAGEAL ECHOCARDIOGRAM N/A 07/01/2013   Procedure: INTRAOPERATIVE TRANSESOPHAGEAL ECHOCARDIOGRAM;  Surgeon: Grace Isaac, MD;  Location: [facility];  Service: Open Heart Surgery;  Laterality: N/A;  . LEFT AND RIGHT HEART CATHETERIZATION WITH CORONARY/GRAFT ANGIOGRAM N/A 10/03/2014   Procedure: LEFT AND RIGHT HEART CATHETERIZATION WITH Beatrix Fetters;  Surgeon: Leonie Man, MD;  Location[facility];  Service: Cardiovascular;  Laterality: N/A;  . LEFT HEART CATHETERIZATION WITH CORONARY ANGIOGRAM N/A 06/11/2011   Procedure: LEFT  HEART CATHETERIZATION WITH CORONARY ANGIOGRAM;  Surgeon: Fulton Reek, MD;  Location: [facility] C[facility] LAB;  Service: Cardiovascular;  Laterality: N/A;  . LEFT HEART CATHETERIZATION WITH CORONARY ANGIOGRAM N/A 06/28/2013   Procedure: LEFT HEART CATHETERIZATION WITH CORONARY ANGIOGRAM;  Surgeon: Leonie Man, MD;  Location: [facility] CATH LAB;  Service: Cardiovascular;  Laterality: N/A;  . NM MYOVIEW LTD  January '13   Diaphragmatic attenuation versus inferior infarct. No ischemia. Normal EF normal wall motion.  Marland Kitchen NM MYOVIEW LTD  June 2014   no ischemia  . NM MYOVIEW LTD  10/2014   LEXISCAN: Normal EF, 61%, No ischemia or infarction.  Mild diaphragmatic attenuation  . PATCH ANGIOPLASTY Left 05/21/2016   Procedure: PATCH ANGIOPLASTY USING HEMASHIELD PLATINUM FINESSE 0.3in x 3 in;  Surgeon: Elam Dutch, MD;  Location: [facility];  Service: Vascular;  Laterality: Left;  . PERCUTANEOUS CORONARY STENT INTERVENTION (PCI-S)  06/11/2011   Procedure: PERCUTANEOUS CORONARY STENT INTERVENTION (PCI-S);  Surgeon: Fulton Reek, MD;  Location: [facility] CATH LAB;  Service: Cardiovascular;;  . PERCUTANEOUS CORONARY STENT INTERVENTION (PCI-S) N/A 10/12/2014   Procedure: PERCUTANEOUS CORONARY STENT INTERVENTION (PCI-S);  Surgeon: Leonie Man, MD;  Location: [facility] CATH LAB;  Ostial-Prox SVG-RCA Xience Alpine DES 3.0 mm x 38 mm; (3.4  - 3.3 mm)  . RETINAL LASER PROCEDURE  Bilateral    "more than once"   . TONSILLECTOMY  as child  . TRANSTHORACIC ECHOCARDIOGRAM  06/29/2013   Focal basal hypertrophy with normal LV size. EF 60-65% no regional WMA. Mild LA dilation  . TRANSTHORACIC ECHOCARDIOGRAM  April 2016   Mod focal basal & mild overall LVH. EF 55-60%. Mild Ao Sclerosis, Mild MR with MAC. Only minimally elevated RVP.    Current Meds  Medication Sig  . acetaminophen (TYLENOL) 325 MG tablet Take 650 mg by mouth daily as needed for mild pain. Reported on 01/03/2016  . amLODipine (NORVASC) 10 MG tablet TAKE 1 TABLET ONCE DAILY.    Marland Kitchen aspirin EC 81 MG tablet Take 1 tablet (81 mg total) by mouth every other day. (Patient taking differently: Take by mouth. Take 94m once daily on Tues, Thurs, Sat, and Sun)  . B-D ULTRAFINE III SHORT PEN 31G X 8 MM MISC See admin instructions.  . carvedilol (COREG) 12.5 MG tablet Take 1 tablet (12.5 mg total) by mouth 2 (two) times daily.  . clopidogrel (PLAVIX) 75 MG tablet Take 1 tablet (75 mg total) by mouth daily.  .Marland Kitchendocusate sodium (STOOL SOFTENER) 100 MG capsule Take 100 mg by mouth as needed for mild constipation.  . fenofibrate 160 MG tablet Take 1 tablet (160 mg total) by mouth daily.  . furosemide (LASIX) 40 MG tablet Take 80 mg in the morning and 40 mg in the evening  . HUMALOG KWIKPEN 100 UNIT/ML SOPN Inject 10-15 Units into the skin 4 (four) times daily. Sliding scale  scale = blood sugar level - 100 / 15  (max 15 units)  . hydrALAZINE (APRESOLINE) 100 MG tablet Take 1 tablet (100 mg total) by mouth 3 (three) times daily.  . insulin glargine (LANTUS) 100 UNIT/ML injection Inject 40-80 Units into the skin 2 (two) times daily. 50 units in am, 80 units in pm  . isosorbide mononitrate (IMDUR) 60 MG 24 hr tablet Take 1 tablet (60 mg total) by mouth daily.  .Marland KitchenNITROSTAT 0.4 MG SL tablet DISSOLVE 1 TABLET UNDER TONGUE AS NEEDED FOR CHEST PAIN,MAY REPEAT IN5 MINUTES FOR 2 DOSES.  .Marland Kitchenpantoprazole (PROTONIX) 40 MG tablet Take 1 tablet (40 mg total) by mouth daily.  . rosuvastatin (CRESTOR) 10 MG tablet TAKE 10 MG Tuesday, Thursday,SATURDAY,SUNDAY  . VASCEPA 1 G CAPS Take 2 g by mouth 2 (two) times daily.   . [DISCONTINUED] carvedilol (COREG) 12.5 MG tablet TAKE 1 TABLET TWICE DAILY.  . [DISCONTINUED] fenofibrate 160 MG tablet Take 160 mg by mouth daily.  . [DISCONTINUED] furosemide (LASIX) 40 MG tablet Take 40-80 mg by mouth 2 (two) times daily. Take 80 mg in the morning and 40 mg in the evening  . [DISCONTINUED] pantoprazole (PROTONIX) 40 MG tablet Take 40 mg by mouth daily.  .  [DISCONTINUED] rosuvastatin (CRESTOR) 10 MG tablet TAKE ONE TABLET EVERY OTHER DAY. (Patient taking differently: take 156m daily on tuesday, thursday, saturday and sunday)   Current Facility-Administered Medications for the 03/11/17 encounter (Office Visit) with HaLeonie ManMD  Medication  . 0.9 %  sodium chloride infusion  . 0.9 %  sodium chloride infusion  . 0.9 %  sodium chloride infusion  . 0.9 %  sodium chloride infusion    Allergies  Allergen Reactions  . Atorvastatin Other (See Comments)    Leg pain  . Ibuprofen Hives  . Pravastatin     Leg pain  . Simvastatin Other (See Comments)    Leg pain  Social History   Social History  . Marital status: Married    Spouse name: N/A  . Number of children: N/A  . Years of education: N/A   Occupational History  . attorney    Social History Main Topics  . Smoking status: Former Smoker    Packs/day: 2.00    Years: 27.00    Types: Cigarettes    Quit date: 11/23/1996  . Smokeless tobacco: Never Used  . Alcohol use 0.0 oz/week     Comment: rarely  . Drug use: No  . Sexual activity: Not Currently   Other Topics Concern  . 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 He notably has not been exercising like he used to. He does walk back and [place] He has social alcoholic beverages but is trying to cut that back and does not smoke.       He tells me he is a sucker for chips but does not eat sweets because of diabetes.    family history includes Breast cancer in his mother and sister; Colon polyps in his father; Hyperlipidemia in his father; Hypertension in his father; Liver cancer in his maternal grandmother; Lung cancer in his sister; Throat cancer in his father.  Wt Readings from Last 3 Encounters:  03/11/17 218 lb (98.9 kg)  01/06/17 215 lb (97.5 kg)  12/30/16 215 lb (97.5 kg)  - last 3/22-wgt  206  PHYSICAL EXAM BP (!) 142/59   Pulse 63   Ht 5' 8.5" (1.74 m)   Wt 218 lb (98.9 kg)   BMI 32.66 kg/m  Physical Exam  Constitutional: He is oriented to person, place, and time. He appears well-developed and well-nourished.  Mild to moderately obese (truncal). Well-groomed  HENT:  Head: Normocephalic and atraumatic.  Eyes: EOM are normal.  Neck: Normal range of motion. Neck supple. Hepatojugular reflux present. No JVD present. Carotid bruit is present.  Cardiovascular: Normal rate.  A regularly irregular rhythm present. Frequent extrasystoles are present. Exam reveals distant heart sounds and decreased pulses (Bilateral pedal pulses). Exam reveals no gallop.   Murmur heard.  Low-pitched harsh crescendo-decrescendo early systolic murmur is present with a grade of 2/6  High-pitched blowing early systolic murmur is also present at the apex radiating to the apex. Pulses:      Femoral pulses are on the right side with bruit, and on the left side with bruit. Normal S1 and physiologically split S2.  Pulmonary/Chest: Breath sounds normal. No respiratory distress. He has no wheezes. He has no rales.  Mildly diminished basal sounds, but likely reduced effort.  Abdominal: Soft. Bowel sounds are normal. He exhibits no distension. There is no tenderness. There is no rebound.  Truncal obesity  Musculoskeletal: Normal range of motion. He exhibits edema (1+ bilateral - ankle and foot).  Slow, deliberate gait.  Neurological: He is alert and oriented to person, place, and time.  Skin: Skin is warm and dry. No rash noted. No erythema.  Psychiatric: He has a normal mood and affect. His behavior is normal. Judgment and thought content normal.  As usual he seems to have a somewhat subdued mood. Not depressed however     Adult ECG Report  Rate: 63 ;  Rhythm: normal sinus rhythm and LVH with repolarization changes. Otherwise stable;   Narrative Interpretation: Stable EKG  Other studies  Reviewed: Additional studies/ records that were reviewed  today include:  Recent Labs: last labs available - Jan 2018 from pcp   ASSESSMENT / PLAN: Problem List Items Addressed This Visit    Angina, class I (HCC) (Chronic)   Relevant Medications   carvedilol (COREG) 12.5 MG tablet   fenofibrate 160 MG tablet   furosemide (LASIX) 40 MG tablet   rosuvastatin (CRESTOR) 10 MG tablet   Other Relevant Orders   EKG 12-Lead (Completed)   Atherosclerotic peripheral vascular disease with intermittent claudication (HCC) (Chronic)    Very much limits his activity level.      Relevant Medications   carvedilol (COREG) 12.5 MG tablet   fenofibrate 160 MG tablet   furosemide (LASIX) 40 MG tablet   rosuvastatin (CRESTOR) 10 MG tablet   CAD S/P PCI SVG-RCA Xience Alpine DES 3.0 mm x 38 mm (ostial-prox) - 3.31m (Chronic)    At present, not complaining of anginal disease. He is now status post CABG. He is on carvedilol, amlodipine along with hydralazine/Imdur. On aspirin and Plavix mostly because of his PAD as well as CAD.  Currently not having ischemic changes, however with dyspnea, low threshold to proceed with ischemic evaluation.      Relevant Medications   carvedilol (COREG) 12.5 MG tablet   fenofibrate 160 MG tablet   furosemide (LASIX) 40 MG tablet   rosuvastatin (CRESTOR) 10 MG tablet   Chronic diastolic CHF (congestive heart failure), NYHA class 2 (HCC) - Primary (Chronic)   Relevant Medications   carvedilol (COREG) 12.5 MG tablet   fenofibrate 160 MG tablet   furosemide (LASIX) 40 MG tablet   rosuvastatin (CRESTOR) 10 MG tablet   Other Relevant Orders   EKG 12-Lead (Completed)   ECHOCARDIOGRAM COMPLETE   CKD (chronic kidney disease), stage III (Chronic)    Because of his C KD, we are leery of doing any invasive procedures. Would try to avoid ischemic evaluation with invasive procedures. For the most part, his diuretic is being controlled by his nephrologist, however with his  symptoms I will take the liberty of increasing his dose of Lasix for next couple days.      Coronary artery disease involving coronary bypass graft with angina pectoris ([place] (Chronic)    Getting close to 3 years since his CABG. He has already had a Myoview recently but no signs of ischemia, but would now recurrent symptoms we may need to do something evaluation especially if the echo looks abnormal.      Relevant Medications   carvedilol (COREG) 12.5 MG tablet   fenofibrate 160 MG tablet   furosemide (LASIX) 40 MG tablet   rosuvastatin (CRESTOR) 10 MG tablet   Other Relevant Orders   EKG 12-Lead (Completed)   ECHOCARDIOGRAM COMPLETE   DOE (dyspnea on exertion) (Chronic)    Shortness of breath was one of his anginal equivalent type symptoms. A little bit concerned because he is noticing a more progressive nature of this dyspnea.  With her having more edema and orthopnea, it could simply be a volume issue, it also be deconditioning.  Plan for now: Bump of Lasix and check echocardiogram - Low threshold for ischemic evaluation      Relevant Orders   EKG 12-Lead (Completed)   ECHOCARDIOGRAM COMPLETE   Edema of both legs (Chronic)    Heart itself this is truly related to heart failure or not, however we will have him increase his Lasix as he does seem to be volume of and dyspneic with mild orthopnea  Reassess shortly; discussed support stockings  Relevant Orders   EKG 12-Lead (Completed)   Hyperlipidemia with target LDL less than 70; intolerance to atorvastatin and simvastatin (Chronic)    Try to have him take his Crestor 4 days a week and see if this helps his lipids.  Low threshold to consider referral for PCSK9 inhibitor. We are checking lipids now, and he will be contacted by our pharmacy team.      Relevant Medications   carvedilol (COREG) 12.5 MG tablet   fenofibrate 160 MG tablet   furosemide (LASIX) 40 MG tablet   rosuvastatin (CRESTOR) 10 MG tablet   Other  Relevant Orders   Lipid panel   Murmur, cardiac  (Chronic)   Relevant Orders   ECHOCARDIOGRAM COMPLETE   Lipid panel   Obesity (BMI 30-39.9) (Chronic)    Hard to lose weight with inability to much exercise. Thankfully, his weight is stable for the last 2 months, but notably increased from March.  A lot of this could be fluid volume and therefore we are increasing Lasix.      Relevant Orders   Lipid panel   PAD (peripheral artery disease) both Rt and Lt disease with Lt ICA of 60-79%    Followed by vascular surgery      Relevant Medications   carvedilol (COREG) 12.5 MG tablet   fenofibrate 160 MG tablet   furosemide (LASIX) 40 MG tablet   rosuvastatin (CRESTOR) 10 MG tablet      Lance Collier has multiple long-standing cardiac/cardiovascular issues that are being evaluated and addressed today. Extensive review of his chart was performed. Multiple different problems discussed. Well over 40 minutes spent with the patient and greater than 50% time was spent in direct consultation.  Current medicines are reviewed at length with the patient today. (+/- concerns) n/a The following changes have been made: - short burst of lasix.  Patient Instructions  SCHEDULE AT [place]  Your physician has requested that you have an echocardiogram. Echocardiography is a painless test that uses sound waves to create images of your heart. It provides your doctor with information about the size and shape of your heart and how well your heart's chambers and valves are working. This procedure takes approximately one hour. There are no restrictions for this procedure.    MEDICATIONS    FUROSEMIDE 3 TABLETS IN THE MORNING AND 2 TABLETS IN THE EVENING  FOR 3 DAYS THEN 2 TABLETS TWICE A DAY  FOR 4 DAYS THEN AGO 2 TABLET IN THE MORNING AND 1 TABLET IN THE AFTERNOON    LIPIDS --NOTHING TO EAT OR DRINK    Studies Ordered:   Orders Placed This Encounter  Procedures  .  Lipid panel  . EKG 12-Lead  . ECHOCARDIOGRAM COMPLETE      Glenetta Hew, M.D., M.S. Interventional Cardiologist   Pager # [phone] Phone # [phone] [address] [place] [employer], [place] [address]

## 2017-03-11 NOTE — Patient Instructions (Addendum)
SCHEDULE AT [place] has requested that you have an echocardiogram. Echocardiography is a painless test that uses sound waves to create images of your heart. It provides your doctor with information about the size and shape of your heart and how well your heart's chambers and valves are working. This procedure takes approximately one hour. There are no restrictions for this procedure.    MEDICATIONS    FUROSEMIDE 3 TABLETS IN THE MORNING AND 2 TABLETS IN THE EVENING  FOR 3 DAYS THEN 2 TABLETS TWICE A DAY  FOR 4 DAYS THEN AGO 2 TABLET IN THE MORNING AND 1 TABLET IN THE AFTERNOON    LIPIDS --NOTHING TO EAT OR DRINK

## 2017-03-14 ENCOUNTER — Encounter: Payer: Self-pay | Admitting: Cardiology

## 2017-03-14 NOTE — Assessment & Plan Note (Addendum)
Shortness of breath was one of his anginal equivalent type symptoms. A little bit concerned because he is noticing a more progressive nature of this dyspnea.  With her having more edema and orthopnea, it could simply be a volume issue, it also be deconditioning.  Plan for now: Bump of Lasix and check echocardiogram - Low threshold for ischemic evaluation

## 2017-03-14 NOTE — Assessment & Plan Note (Signed)
Followed by vascular surgery. 

## 2017-03-14 NOTE — Assessment & Plan Note (Signed)
At present, not complaining of anginal disease. He is now status post CABG. He is on carvedilol, amlodipine along with hydralazine/Imdur. On aspirin and Plavix mostly because of his PAD as well as CAD.  Currently not having ischemic changes, however with dyspnea, low threshold to proceed with ischemic evaluation.

## 2017-03-14 NOTE — Assessment & Plan Note (Signed)
Try to have him take his Crestor 4 days a week and see if this helps his lipids.  Low threshold to consider referral for PCSK9 inhibitor. We are checking lipids now, and he will be contacted by our pharmacy team.

## 2017-03-14 NOTE — Assessment & Plan Note (Signed)
Getting close to 3 years since his CABG. He has already had a Myoview recently but no signs of ischemia, but would now recurrent symptoms we may need to do something evaluation especially if the echo looks abnormal.

## 2017-03-14 NOTE — Assessment & Plan Note (Addendum)
Because of his C KD, we are leery of doing any invasive procedures. Would try to avoid ischemic evaluation with invasive procedures. For the most part, his diuretic is being controlled by his nephrologist, however with his symptoms I will take the liberty of increasing his dose of Lasix for next couple days.

## 2017-03-14 NOTE — Assessment & Plan Note (Signed)
Very much limits his activity level.

## 2017-03-14 NOTE — Assessment & Plan Note (Signed)
Heart itself this is truly related to heart failure or not, however we will have him increase his Lasix as he does seem to be volume of and dyspneic with mild orthopnea  Reassess shortly; discussed support stockings

## 2017-03-14 NOTE — Assessment & Plan Note (Signed)
Hard to lose weight with inability to much exercise. Thankfully, his weight is stable for the last 2 months, but notably increased from March.  A lot of this could be fluid volume and therefore we are increasing Lasix.

## 2017-03-23 HISTORY — PX: TRANSTHORACIC ECHOCARDIOGRAM: SHX275

## 2017-04-06 ENCOUNTER — Ambulatory Visit (HOSPITAL_COMMUNITY): Payer: Medicare Other | Attending: Cardiovascular Disease

## 2017-04-06 ENCOUNTER — Other Ambulatory Visit: Payer: Self-pay

## 2017-04-06 DIAGNOSIS — E785 Hyperlipidemia, unspecified: Secondary | ICD-10-CM | POA: Insufficient documentation

## 2017-04-06 DIAGNOSIS — R0600 Dyspnea, unspecified: Secondary | ICD-10-CM | POA: Diagnosis present

## 2017-04-06 DIAGNOSIS — Z951 Presence of aortocoronary bypass graft: Secondary | ICD-10-CM | POA: Diagnosis not present

## 2017-04-06 DIAGNOSIS — I5032 Chronic diastolic (congestive) heart failure: Secondary | ICD-10-CM | POA: Insufficient documentation

## 2017-04-06 DIAGNOSIS — R0609 Other forms of dyspnea: Secondary | ICD-10-CM

## 2017-04-06 DIAGNOSIS — R011 Cardiac murmur, unspecified: Secondary | ICD-10-CM | POA: Diagnosis not present

## 2017-04-06 DIAGNOSIS — I080 Rheumatic disorders of both mitral and aortic valves: Secondary | ICD-10-CM | POA: Diagnosis not present

## 2017-04-06 DIAGNOSIS — I130 Hypertensive heart and chronic kidney disease with heart failure and stage 1 through stage 4 chronic kidney disease, or unspecified chronic kidney disease: Secondary | ICD-10-CM | POA: Diagnosis not present

## 2017-04-06 DIAGNOSIS — I25709 Atherosclerosis of coronary artery bypass graft(s), unspecified, with unspecified angina pectoris: Secondary | ICD-10-CM | POA: Insufficient documentation

## 2017-04-06 DIAGNOSIS — E1122 Type 2 diabetes mellitus with diabetic chronic kidney disease: Secondary | ICD-10-CM | POA: Insufficient documentation

## 2017-04-06 DIAGNOSIS — N189 Chronic kidney disease, unspecified: Secondary | ICD-10-CM | POA: Diagnosis not present

## 2017-04-06 DIAGNOSIS — I252 Old myocardial infarction: Secondary | ICD-10-CM | POA: Diagnosis not present

## 2017-04-06 DIAGNOSIS — Z87891 Personal history of nicotine dependence: Secondary | ICD-10-CM | POA: Insufficient documentation

## 2017-04-07 DIAGNOSIS — R05 Cough: Secondary | ICD-10-CM | POA: Diagnosis not present

## 2017-04-07 DIAGNOSIS — J209 Acute bronchitis, unspecified: Secondary | ICD-10-CM | POA: Diagnosis not present

## 2017-04-07 DIAGNOSIS — Z6832 Body mass index (BMI) 32.0-32.9, adult: Secondary | ICD-10-CM | POA: Diagnosis not present

## 2017-04-07 DIAGNOSIS — I10 Essential (primary) hypertension: Secondary | ICD-10-CM | POA: Diagnosis not present

## 2017-04-10 ENCOUNTER — Telehealth: Payer: Self-pay | Admitting: *Deleted

## 2017-04-10 NOTE — Telephone Encounter (Addendum)
-----   Message from Leonie Man, MD sent at 04/08/2017 12:11 PM EDT ----- Echocardiogram results shows relatively normal/preserved left ventricular systolic function. There is abnormal relaxation (grade 2 diastolic dysfunction which is not unexpected. Major treatment for this is blood pressure control and diuresis.  No significant valvular lesions, but there is some aortic calcification The pulmonary pressures do appear to be related to the elevated which goes along with the grade 2 diastolic dysfunction as a potential cause for his dyspnea.  Glenetta Hew, MD  Left message for pt to call

## 2017-04-13 NOTE — Telephone Encounter (Signed)
Follow up     Pt is returning call about echo.

## 2017-04-13 NOTE — Telephone Encounter (Signed)
Spoke with pt, aware of echo results. 

## 2017-04-14 ENCOUNTER — Ambulatory Visit (INDEPENDENT_AMBULATORY_CARE_PROVIDER_SITE_OTHER): Payer: Medicare Other

## 2017-04-14 VITALS — BP 154/64 | HR 60 | Ht 68.5 in | Wt 218.0 lb

## 2017-04-14 DIAGNOSIS — H3563 Retinal hemorrhage, bilateral: Secondary | ICD-10-CM | POA: Diagnosis not present

## 2017-04-14 DIAGNOSIS — I493 Ventricular premature depolarization: Secondary | ICD-10-CM

## 2017-04-14 DIAGNOSIS — E113553 Type 2 diabetes mellitus with stable proliferative diabetic retinopathy, bilateral: Secondary | ICD-10-CM | POA: Diagnosis not present

## 2017-04-14 DIAGNOSIS — H4312 Vitreous hemorrhage, left eye: Secondary | ICD-10-CM | POA: Diagnosis not present

## 2017-04-14 DIAGNOSIS — E1139 Type 2 diabetes mellitus with other diabetic ophthalmic complication: Secondary | ICD-10-CM | POA: Diagnosis not present

## 2017-04-14 LAB — HM DIABETES EYE EXAM

## 2017-04-14 NOTE — Patient Instructions (Signed)
1.) Reason for visit: Walk In   2.) Name of MD requesting visit: DOD Dr.Berry  3.) H&P: Patient woke up this morning unable to see out of Lf eye.Patient saw Dr.Rankin his left eye full of blood.Dr.Rankin listen to his heart and was told heart beat irregular.  4.) ROS related to problem: Patient feels ok does not notice irregular heart beat  5.) Assessment and plan per MD: DOD Dr.Berry ordered a EKG.EKG revealed sinus rhythm with occasional PVC.Dr.Berry advised continue same medications.No changes.Keep appointment as planned with Dr.Harding.

## 2017-04-15 ENCOUNTER — Other Ambulatory Visit: Payer: Self-pay | Admitting: Cardiology

## 2017-04-15 ENCOUNTER — Ambulatory Visit: Payer: Medicare Other | Admitting: Physician Assistant

## 2017-04-23 DIAGNOSIS — I10 Essential (primary) hypertension: Secondary | ICD-10-CM | POA: Diagnosis not present

## 2017-04-23 DIAGNOSIS — E1139 Type 2 diabetes mellitus with other diabetic ophthalmic complication: Secondary | ICD-10-CM | POA: Diagnosis not present

## 2017-04-23 DIAGNOSIS — Z794 Long term (current) use of insulin: Secondary | ICD-10-CM | POA: Diagnosis not present

## 2017-04-23 DIAGNOSIS — N183 Chronic kidney disease, stage 3 (moderate): Secondary | ICD-10-CM | POA: Diagnosis not present

## 2017-04-23 DIAGNOSIS — Z6830 Body mass index (BMI) 30.0-30.9, adult: Secondary | ICD-10-CM | POA: Diagnosis not present

## 2017-04-23 DIAGNOSIS — Z23 Encounter for immunization: Secondary | ICD-10-CM | POA: Diagnosis not present

## 2017-04-28 DIAGNOSIS — E113592 Type 2 diabetes mellitus with proliferative diabetic retinopathy without macular edema, left eye: Secondary | ICD-10-CM | POA: Diagnosis not present

## 2017-04-28 DIAGNOSIS — H4312 Vitreous hemorrhage, left eye: Secondary | ICD-10-CM | POA: Diagnosis not present

## 2017-04-28 DIAGNOSIS — E113512 Type 2 diabetes mellitus with proliferative diabetic retinopathy with macular edema, left eye: Secondary | ICD-10-CM | POA: Diagnosis not present

## 2017-05-08 ENCOUNTER — Ambulatory Visit (INDEPENDENT_AMBULATORY_CARE_PROVIDER_SITE_OTHER): Payer: Medicare Other | Admitting: Cardiology

## 2017-05-08 VITALS — Ht 68.5 in | Wt 208.0 lb

## 2017-05-08 DIAGNOSIS — N183 Chronic kidney disease, stage 3 unspecified: Secondary | ICD-10-CM

## 2017-05-08 DIAGNOSIS — Z9861 Coronary angioplasty status: Secondary | ICD-10-CM

## 2017-05-08 DIAGNOSIS — R011 Cardiac murmur, unspecified: Secondary | ICD-10-CM

## 2017-05-08 DIAGNOSIS — I2510 Atherosclerotic heart disease of native coronary artery without angina pectoris: Secondary | ICD-10-CM

## 2017-05-08 DIAGNOSIS — Z951 Presence of aortocoronary bypass graft: Secondary | ICD-10-CM

## 2017-05-08 DIAGNOSIS — R600 Localized edema: Secondary | ICD-10-CM

## 2017-05-08 DIAGNOSIS — I10 Essential (primary) hypertension: Secondary | ICD-10-CM

## 2017-05-08 DIAGNOSIS — I739 Peripheral vascular disease, unspecified: Secondary | ICD-10-CM

## 2017-05-08 DIAGNOSIS — I209 Angina pectoris, unspecified: Secondary | ICD-10-CM

## 2017-05-08 DIAGNOSIS — I5032 Chronic diastolic (congestive) heart failure: Secondary | ICD-10-CM | POA: Diagnosis not present

## 2017-05-08 MED ORDER — FUROSEMIDE 40 MG PO TABS: ORAL_TABLET | ORAL | 3 refills | Status: DC

## 2017-05-08 NOTE — Progress Notes (Signed)
1 a little bit on and swelling no better   PCP: Lance Bowen, MD  Clinic Note: Chief Complaint  Patient presents with  . Follow-up    CHF exacerbation  . Chest Pain  . Shortness of Breath  . Edema    HPI: Lance Collier is a 71 y.o. male with a PMH below who presents today for ~56-monthfollow-up for CAD-PCI-CABG and diastolic heart failure. He also has significant PAD that is pretty much non-revascularizable. He is limited a lot by his PAD.  Lance Collier last seen on September 19 and was noticing some worsening exertional dyspnea and PND/orthopnea symptoms.  His edema was also worse.  We therefore adjusted his Lasix regimen and ordered a follow-up echocardiogram.  Recent Hospitalizations: None   He came in for DOD evaluation for irregular heartbeat and bleeding in the left eye.  EKG showed sinus rhythm with occasional PVCs and not atrial fibrillation.  Studies Personally Reviewed - (if available, images/films reviewed: From Epic Chart or Care Everywhere)  Transthoracic echo April 06, 2017: EF 55-60%.  Normal wall thickness.  No regional wall motion normalities.  GR 2 DD.  Mild-moderate calcified aortic valve.  Mild RV systolic dysfunction.  Moderate RV dilation.  Moderately increased PA pressures.  Interval History: Lance Collier today stating that he feels much better - notably less dyspneic & all the swelling has gone.  He now denies PND / orthopnea as well.   Having titrated his lasix dose up resolved the symptoms of last visit - he is now back to being limited by claudication. He still denies any resting or exertional chest pain / pressure.   He has occasional flip-flop beats, but no prolonged rapid irregular heartbeats/palpitations.  No melena, hematochezia, hematuria or epistaxis. -He did have a ruptured blood vessel in his eye with acute bloodshot eye, but no vision change. No syncope /near syncope, or TIA/amaurosis fugax.   ROS: A comprehensive was performed.   Pertinent positives and negatives as above Review of Systems  Constitutional: Positive for malaise/fatigue (But energy seems better than it was last visit.).  HENT: Negative for congestion.   Respiratory: Negative for cough and wheezing.   Genitourinary:       Nocturia  Musculoskeletal: Positive for joint pain. Negative for falls.  Neurological: Positive for dizziness (Balance issues.) and tingling (Peripheral neuropathy).  Psychiatric/Behavioral: The patient has insomnia.        He denies depression, but seems to have a somewhat depressed mood.  A little better today than last visit.  All other systems reviewed and are negative.  I have reviewed and (if needed) personally updated the patient's problem list, medications, allergies, past medical and surgical history, social and family history.   Past Medical History:  Diagnosis Date  . Anemia   . Aortic sclerosis  - without Stenosis  March 2014   Echo: Aortic sclerosis without stenosis. EF 55-60% with normal WM; mild concentric hypertrophy with normal relaxation.  .Marland KitchenCAD (coronary artery disease), autologous vein bypass graft 09/2014   10/03/14: Cath for Class III-IV Angina -- 80% ostial-proximal SVG-RPDA (minimal flow down native PDA from native RCA with 50% proximal and distal, patent LIMA-LAD, SVG-OM with retrograde filling of OM 2. --> 10/12/14: Staged PCI of SVG-RPDA - Xience DES  3.0 mm x 38 mm; (3.4  - 3.3 mm)  . CAD S/P percutaneous coronary angioplasty 06/12/2011; 09/2104   a) 2012: Complex PCI mRCA ([place]) --  2 overlapping Promus element DES stents.; 09/2014:  ost-prox SVG-rPDA - Xience DES 3.0 mm x 38 mm; (3.4  - 3.3 mm)  . CHF (congestive heart failure) (Lance Collier)   . Childhood asthma   . CKD (chronic kidney disease), stage III (Lance Collier)    "mild; related to diabetes" (10/02/2014)  . Complication of anesthesia    "I've had name recall recognition problems since my last anesthesia"  . DM (diabetes mellitus) type II uncontrolled with  eye manifestation (Lance Collier) 2013   Diabetic retinopathy with retinal hemorrhages since;, recurrent in August 2014 treated with Avastin shots  . Dyspnea    mainly with exertion  . GERD (gastroesophageal reflux disease)   . Heart murmur    questionable  . High cholesterol   . Hypertension   . Migraine ~ 2004   "once"  . PAD (peripheral artery disease) (HCC)    with claudication  . PONV (postoperative nausea and vomiting)    s/p tonsillectomy  . 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 07/01/2013 (Cath for Crescendo Unstable Angina) --> Lance Collier: For LM disease; LIMA-LAD, SVG-RCA, SCG-Cx; b. 09/2014 Cath/Staged PCI: patent LIMA->LAD, VG->OM1/RI, VG->RCA 80% (3.0x38 Xience Alpine DES on 10/12/2014).  . 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  Likely thromboembolic event with PDA occlusion. 06/12/2011   Post Complex PCI, pt states no mi    Past Surgical History:  Procedure Laterality Date  . CANCELLED PROCEDURE  09/05/2016   Performed by Lance Ada, MD at [facility]  . CARDIAC CATHETERIZATION  06/28/2013   ostial LM disese, RCA ~40-50%ISR  . CAROTID ENDARTERECTOMY Left 05/21/2016  . COLONOSCOPY    . CORONARY ANGIOPLASTY WITH STENT PLACEMENT Right September 2012   2 overlapping Promus DES 2.7 mm at 12 mm x2; postdilated to 3 mm  . CORONARY ARTERY BYPASS GRAFTING (CABG) N/A 07/01/2013   Performed by Lance Isaac, MD at [facility]  . EYE SURGERY Bilateral    ioc for cataracts  . INTRAOPERATIVE TRANSESOPHAGEAL ECHOCARDIOGRAM N/A 07/01/2013   Performed by Lance Isaac, MD at [place]  . LEFT AND RIGHT HEART CATHETERIZATION WITH CORONARY/GRAFT ANGIOGRAM N/A 10/03/2014   Performed by Lance Man, MD at [facility] CATH LAB  . LEFT CAROTID ENDARTERECTOMY Left 05/21/2016   Performed by Lance Dutch, MD at [facility]  . LEFT HEART CATHETERIZATION WITH CORONARY ANGIOGRAM N/A 06/28/2013   Performed by Lance Man, MD at [facility] CATH LAB  . LEFT HEART  CATHETERIZATION WITH CORONARY ANGIOGRAM N/A 06/11/2011   Performed by Lance Reek, MD at [facility] CATH LAB  . NM MYOVIEW LTD  January '13   Diaphragmatic attenuation versus inferior infarct. No ischemia. Normal EF normal wall motion.  Marland Kitchen NM MYOVIEW LTD  June 2014   no ischemia  . NM MYOVIEW LTD  10/2014   LEXISCAN: Normal EF, 61%, No ischemia or infarction.  Mild diaphragmatic attenuation  . PATCH ANGIOPLASTY USING HEMASHIELD PLATINUM FINESSE 0.3in x 3 in Left 05/21/2016   Performed by Lance Dutch, MD at [place]  . PERCUTANEOUS CORONARY STENT INTERVENTION (PCI-S) N/A 10/12/2014   Performed by Lance Man, MD at [facility] CATH LAB  . PERCUTANEOUS CORONARY STENT INTERVENTION (PCI-S)  06/11/2011   Performed by Lance Reek, MD at [facility] CATH LAB  . RETINAL LASER PROCEDURE Bilateral    "more than once"   . TONSILLECTOMY  as child  . TRANSTHORACIC ECHOCARDIOGRAM  03/2017    EF 55-60%.  Normal wall thickness.  No regional wall motion normalities.  GR 2 DD.  Mild-moderate calcified aortic valve.  Mild RV systolic dysfunction.  Moderate RV dilation.  Moderately increased PA pressures.  Domingo Dimes ECHOCARDIOGRAM  April 2016   Mod focal basal & mild overall LVH. EF 55-60%. Mild Ao Sclerosis, Mild MR with MAC. Only minimally elevated RVP.    Current Meds  Medication Sig  . acetaminophen (TYLENOL) 325 MG tablet Take 650 mg by mouth daily as needed for mild pain. Reported on 01/03/2016  . amLODipine (NORVASC) 10 MG tablet TAKE 1 TABLET ONCE DAILY.  Marland Kitchen aspirin EC 81 MG tablet Take 1 tablet (81 mg total) by mouth every other day. (Patient taking differently: Take by mouth. Take 75m once daily on Tues, Thurs, Sat, and Sun)  . B-D ULTRAFINE III SHORT PEN 31G X 8 MM MISC See admin instructions.  . carvedilol (COREG) 12.5 MG tablet Take 1 tablet (12.5 mg total) by mouth 2 (two) times daily.  . clopidogrel (PLAVIX) 75 MG tablet Take 1 tablet (75 mg total) by mouth daily.  .Marland Kitchendocusate sodium (STOOL  SOFTENER) 100 MG capsule Take 100 mg by mouth as needed for mild constipation.  . fenofibrate 160 MG tablet Take 1 tablet (160 mg total) by mouth daily.  . furosemide (LASIX) 40 MG tablet Take 120 mg in the morning and 80 mg in the evening  May take an extra 40 mg if needed for 3 lb increase  . HUMALOG KWIKPEN 100 UNIT/ML SOPN Inject 10-15 Units into the skin 4 (four) times daily. Sliding scale  scale = blood sugar level - 100 / 15  (max 15 units)  . hydrALAZINE (APRESOLINE) 100 MG tablet Take 1 tablet (100 mg total) by mouth 3 (three) times daily.  . insulin glargine (LANTUS) 100 UNIT/ML injection Inject 40-80 Units 2 (two) times daily into the skin. 46 in the AM and 70 in the PM  . isosorbide mononitrate (IMDUR) 60 MG 24 hr tablet Take 1 tablet (60 mg total) by mouth daily.  .Marland KitchenNITROSTAT 0.4 MG SL tablet DISSOLVE 1 TABLET UNDER TONGUE AS NEEDED FOR CHEST PAIN,MAY REPEAT IN5 MINUTES FOR 2 DOSES.  .Marland Kitchenpantoprazole (PROTONIX) 40 MG tablet Take 1 tablet (40 mg total) by mouth daily.  . rosuvastatin (CRESTOR) 10 MG tablet TAKE 10 MG Tuesday, Thursday,SATURDAY,SUNDAY  . VASCEPA 1 G CAPS Take 2 g by mouth 2 (two) times daily.   . [DISCONTINUED] furosemide (LASIX) 40 MG tablet Take 80 mg in the morning and 40 mg in the evening (Patient taking differently: Take 120 mg in the morning and 80 mg in the evening)   Current Facility-Administered Medications for the 05/08/17 encounter (Office Visit) with HLeonie Man MD  Medication  . 0.9 %  sodium chloride infusion  . 0.9 %  sodium chloride infusion  . 0.9 %  sodium chloride infusion  . 0.9 %  sodium chloride infusion    Allergies  Allergen Reactions  . Atorvastatin Other (See Comments)    Leg pain  . Ibuprofen Hives  . Pravastatin     Leg pain  . Simvastatin Other (See Comments)    Leg pain    Social History   Socioeconomic History  . Marital status: Married    Spouse name: None  . Number of children: None  . Years of education: None  .  Highest education level: None  Social Needs  . Financial resource strain: None  . Food insecurity - worry: None  . Food insecurity -  inability: None  . Transportation needs - medical: None  . Transportation needs - non-medical: None  Occupational History  . Occupation: attorney  Tobacco Use  . Smoking status: Former Smoker    Packs/day: 2.00    Years: 27.00    Pack years: 54.00    Types: Cigarettes    Last attempt to quit: 11/23/1996    Years since quitting: 20.4  . Smokeless tobacco: Never Used  Substance and Sexual Activity  . Alcohol use: Yes    Alcohol/week: 0.0 oz    Comment: rarely  . Drug use: No  . Sexual activity: Not Currently  Other Topics Concern  . None  Social History Narrative   He is a Hydrologist. 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 He notably has not been exercising like he used to. He does walk back and [place]. He has social alcoholic beverages but is trying to cut that back and does not smoke.       He tells me he is a sucker for chips but does not eat sweets because of diabetes.    family history includes Breast cancer in his mother and sister; Colon polyps in his father; Hyperlipidemia in his father; Hypertension in his father; Liver cancer in his maternal grandmother; Lung cancer in his sister; Throat cancer in his father.  Wt Readings from Last 3 Encounters:  05/08/17 208 lb (94.3 kg)  04/14/17 218 lb (98.9 kg)  03/11/17 218 lb (98.9 kg)   3/22-wgt 206  PHYSICAL EXAM Ht 5' 8.5" (1.74 m)   Wt 208 lb (94.3 kg)   BMI 31.17 kg/m  Physical Exam  Constitutional: He is oriented to person, place, and time. He appears well-developed and well-nourished.  Mild to moderately obese (truncal). Well-groomed  HENT:  Head: Normocephalic.  Left eye is still mildly bloodshot, but no longer puffy/swollen.  Eyes: EOM are normal.  Neck: Normal range of motion. Neck supple. Hepatojugular reflux  present. No JVD present. Carotid bruit is present.  Cardiovascular: Normal rate. A regularly irregular rhythm present. Frequent extrasystoles are present. Exam reveals distant heart sounds and decreased pulses (Bilateral pedal pulses). Exam reveals no gallop.  Murmur heard.  Low-pitched harsh crescendo-decrescendo early systolic murmur is present with a grade of 2/6. High-pitched blowing early systolic murmur is also present at the apex radiating to the apex. Pulses:      Femoral pulses are on the right side with bruit, and on the left side with bruit. Normal S1 and physiologically split S2.  Pulmonary/Chest: Breath sounds normal. No respiratory distress. He has no wheezes. He has no rales.  Mildly diminished basal sounds, but likely reduced effort.  Abdominal: Soft. Bowel sounds are normal. He exhibits no distension. There is no tenderness. There is no rebound.  Truncal obesity  Musculoskeletal: Normal range of motion. He exhibits edema (1+ bilateral - ankle and foot).  Slow, deliberate gait.  Neurological: He is alert and oriented to person, place, and time.  Skin: Skin is warm and dry. No rash noted. No erythema.  Psychiatric: He has a normal mood and affect. His behavior is normal. Judgment and thought content normal.  As usual he seems to have a somewhat subdued mood. 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  He is on stable dose of beta-blocker and calcium channel blocker along with Imdur.  He is disease both the native vessels as well grafts      Relevant Medications   furosemide (LASIX) 40 MG tablet   CAD S/P PCI SVG-RCA Xience Alpine DES 3.0 mm x 38 mm (ostial-prox) - 3.51m  (Chronic)    Status post CABG with no further anginal type symptoms.  Seems more CHF type symptoms.  He is on a stable dose of carvedilol as well as amlodipine with hydralazine/Imdur.  On aspirin plus Plavix as much because of PAD as well as CAD.  The fact that his dyspnea symptoms improved with diuresis would suggest if the symptoms were not related to ischemia, more so diastolic dysfunction.      Relevant Medications   furosemide (LASIX) 40 MG tablet   Chronic diastolic CHF (congestive heart failure), NYHA class 2 (HCC) - Primary (Chronic)    Notably improved after recent mild exacerbation.  We went back over the specifics of sliding scale Lasix.  He felt his home dry weight is according to his scale roughly 200 pounds.  He will adjust his Lasix dosing based on 3 pounds from that dry weight.  He is currently taking 120 mg in the morning and 80 mg in the evening --> with weight gain he will take an additional 40 mg in the evening to make it 120 mg twice daily.  He is on hydralazine/nitrate for afterload reduction along with carvedilol and amlodipine.  Not on ACE inhibitor/ARB because of renal insufficiency.      Relevant Medications   furosemide (LASIX) 40 MG tablet   CKD (chronic kidney disease), stage III (HCC) (Chronic)    Doing her best to avoid invasive procedures.  Hypertension and diuretic also being managed by his nephrologist.  However, we cannot allow him to start developing more edema and diastolic dysfunction symptoms which would make him more symptomatic.      Edema of both legs (Chronic)    The fact that he had dyspnea and edema together with suggested that his edema was probably related to exacerbation of his diastolic heart failure.  Notably improved with diuresis.  No more orthopnea and swelling since resolved.      Essential hypertension (Chronic)    Blood pressure looks much better today on current dose of hydralazine/nitrate (he indicates that he is probably not taking  it 3 times daily every single day.  He is also on a steady dose of carvedilol and amlodipine.        Relevant Medications   furosemide (LASIX) 40 MG tablet   Murmur, cardiac  (Chronic)    Secondary to aortic sclerosis      PAD (peripheral artery disease) both Rt and Lt disease with Lt ICA of 60-79%    Followed by vascular surgery.  Seems like his claudication may be more limiting than any other conditions..      Relevant Medications   furosemide (LASIX) 40 MG tablet   S/P CABG (coronary artery bypass graft)  x 3, 07/01/13, LIMA-LAD; VG-RCA; VG- LCX (Chronic)      Current medicines are reviewed at length with the patient today. (+/- concerns) n/a The following changes have been made: - short burst of lasix.  Patient Instructions  MEDICATION  INSTRUCTIONS No changes with current medications  continue  to with sliding scale Sliding scale Lasix: Weigh yourself when you get home, then Daily in the Morning. Your dry weight will be what your scale says on the day you return home.(here is 200 lbs.).   If you gain more than 3 pounds from dry weight: Increase the Lasix dosing to 160 mg in the morning and 80 mg in the afternoon until weight returns to baseline dry weight.   If the weight goes down more than 3 pounds from dry weight: Hold Lasix until it returns to baseline dry weight  Your physician wants you to follow-up in 6 months with DR Kinga Cassar.You will receive a reminder letter in the mail two months in advance. If you don't receive a letter, please call our office to schedule the follow-up appointment.    If you need a refill on your cardiac medications before your next appointment, please call your pharmacy.    Studies Ordered:   No orders of the defined types were placed in this encounter.     Glenetta Hew, M.D., M.S. Interventional Cardiologist   Pager # [phone] Phone # [phone] [address]. [place], [place] [address]

## 2017-05-08 NOTE — Patient Instructions (Signed)
MEDICATION  INSTRUCTIONS No changes with current medications  continue to with sliding scale Sliding scale Lasix: Weigh yourself when you get home, then Daily in the Morning. Your dry weight will be what your scale says on the day you return home.(here is 200 lbs.).   If you gain more than 3 pounds from dry weight: Increase the Lasix dosing to 160 mg in the morning and 80 mg in the afternoon until weight returns to baseline dry weight.   If the weight goes down more than 3 pounds from dry weight: Hold Lasix until it returns to baseline dry weight  Your physician wants you to follow-up in 6 months with DR HARDING.You will receive a reminder letter in the mail two months in advance. If you don't receive a letter, please call our office to schedule the follow-up appointment.    If you need a refill on your cardiac medications before your next appointment, please call your pharmacy.

## 2017-05-10 ENCOUNTER — Encounter: Payer: Self-pay | Admitting: Cardiology

## 2017-05-10 NOTE — Assessment & Plan Note (Addendum)
Doing her best to avoid invasive procedures.  Hypertension and diuretic also being managed by his nephrologist.  However, we cannot allow him to start developing more edema and diastolic dysfunction symptoms which would make him more symptomatic.

## 2017-05-10 NOTE — Assessment & Plan Note (Signed)
Notably improved after recent mild exacerbation.  We went back over the specifics of sliding scale Lasix.  He felt his home dry weight is according to his scale roughly 200 pounds.  He will adjust his Lasix dosing based on 3 pounds from that dry weight.  He is currently taking 120 mg in the morning and 80 mg in the evening --> with weight gain he will take an additional 40 mg in the evening to make it 120 mg twice daily.  He is on hydralazine/nitrate for afterload reduction along with carvedilol and amlodipine.  Not on ACE inhibitor/ARB because of renal insufficiency.

## 2017-05-10 NOTE — Assessment & Plan Note (Signed)
Status post CABG with no further anginal type symptoms.  Seems more CHF type symptoms.  He is on a stable dose of carvedilol as well as amlodipine with hydralazine/Imdur.  On aspirin plus Plavix as much because of PAD as well as CAD.  The fact that his dyspnea symptoms improved with diuresis would suggest if the symptoms were not related to ischemia, more so diastolic dysfunction.

## 2017-05-10 NOTE — Assessment & Plan Note (Signed)
The fact that he had dyspnea and edema together with suggested that his edema was probably related to exacerbation of his diastolic heart failure.  Notably improved with diuresis.  No more orthopnea and swelling since resolved.

## 2017-05-10 NOTE — Assessment & Plan Note (Addendum)
Blood pressure looks much better today on current dose of hydralazine/nitrate (he indicates that he is probably not taking it 3 times daily every single day.  He is also on a steady dose of carvedilol and amlodipine.

## 2017-05-10 NOTE — Assessment & Plan Note (Signed)
Seems to be improved since his last PCI in 2016.  He is on stable dose of beta-blocker and calcium channel blocker along with Imdur.  He is disease both the native vessels as well grafts

## 2017-05-10 NOTE — Assessment & Plan Note (Signed)
Followed by vascular surgery.  Seems like his claudication may be more limiting than any other conditions.Lance Collier

## 2017-05-10 NOTE — Assessment & Plan Note (Signed)
Secondary to aortic sclerosis 

## 2017-06-11 DIAGNOSIS — M25572 Pain in left ankle and joints of left foot: Secondary | ICD-10-CM | POA: Diagnosis not present

## 2017-06-11 DIAGNOSIS — M25561 Pain in right knee: Secondary | ICD-10-CM | POA: Diagnosis not present

## 2017-06-13 ENCOUNTER — Other Ambulatory Visit: Payer: Self-pay | Admitting: Cardiology

## 2017-06-15 NOTE — Telephone Encounter (Signed)
REFILL 

## 2017-06-19 DIAGNOSIS — H4312 Vitreous hemorrhage, left eye: Secondary | ICD-10-CM | POA: Diagnosis not present

## 2017-06-19 DIAGNOSIS — E113512 Type 2 diabetes mellitus with proliferative diabetic retinopathy with macular edema, left eye: Secondary | ICD-10-CM | POA: Diagnosis not present

## 2017-06-19 DIAGNOSIS — E113553 Type 2 diabetes mellitus with stable proliferative diabetic retinopathy, bilateral: Secondary | ICD-10-CM | POA: Diagnosis not present

## 2017-06-19 DIAGNOSIS — E113592 Type 2 diabetes mellitus with proliferative diabetic retinopathy without macular edema, left eye: Secondary | ICD-10-CM | POA: Diagnosis not present

## 2017-06-29 DIAGNOSIS — I5032 Chronic diastolic (congestive) heart failure: Secondary | ICD-10-CM | POA: Diagnosis not present

## 2017-06-29 DIAGNOSIS — I739 Peripheral vascular disease, unspecified: Secondary | ICD-10-CM | POA: Diagnosis not present

## 2017-06-29 DIAGNOSIS — Z6831 Body mass index (BMI) 31.0-31.9, adult: Secondary | ICD-10-CM | POA: Diagnosis not present

## 2017-06-29 DIAGNOSIS — I6529 Occlusion and stenosis of unspecified carotid artery: Secondary | ICD-10-CM | POA: Diagnosis not present

## 2017-06-29 DIAGNOSIS — Z1389 Encounter for screening for other disorder: Secondary | ICD-10-CM | POA: Diagnosis not present

## 2017-06-29 DIAGNOSIS — E7849 Other hyperlipidemia: Secondary | ICD-10-CM | POA: Diagnosis not present

## 2017-06-29 DIAGNOSIS — E1139 Type 2 diabetes mellitus with other diabetic ophthalmic complication: Secondary | ICD-10-CM | POA: Diagnosis not present

## 2017-06-29 DIAGNOSIS — N183 Chronic kidney disease, stage 3 (moderate): Secondary | ICD-10-CM | POA: Diagnosis not present

## 2017-06-29 DIAGNOSIS — D126 Benign neoplasm of colon, unspecified: Secondary | ICD-10-CM | POA: Diagnosis not present

## 2017-06-29 DIAGNOSIS — I25810 Atherosclerosis of coronary artery bypass graft(s) without angina pectoris: Secondary | ICD-10-CM | POA: Diagnosis not present

## 2017-06-29 DIAGNOSIS — E113599 Type 2 diabetes mellitus with proliferative diabetic retinopathy without macular edema, unspecified eye: Secondary | ICD-10-CM | POA: Diagnosis not present

## 2017-06-29 DIAGNOSIS — D508 Other iron deficiency anemias: Secondary | ICD-10-CM | POA: Diagnosis not present

## 2017-07-02 ENCOUNTER — Ambulatory Visit (INDEPENDENT_AMBULATORY_CARE_PROVIDER_SITE_OTHER)
Admission: RE | Admit: 2017-07-02 | Discharge: 2017-07-02 | Disposition: A | Discharge status: Home / Self Care | Payer: Medicare Other | Source: Ambulatory Visit | Attending: Vascular Surgery | Admitting: Vascular Surgery

## 2017-07-02 ENCOUNTER — Ambulatory Visit (HOSPITAL_COMMUNITY)
Admission: RE | Admit: 2017-07-02 | Discharge: 2017-07-02 | Disposition: A | Discharge status: Home / Self Care | Payer: Medicare Other | Source: Ambulatory Visit | Attending: Vascular Surgery | Admitting: Vascular Surgery

## 2017-07-02 ENCOUNTER — Encounter: Payer: Self-pay | Admitting: Vascular Surgery

## 2017-07-02 ENCOUNTER — Ambulatory Visit (INDEPENDENT_AMBULATORY_CARE_PROVIDER_SITE_OTHER): Payer: Medicare Other | Admitting: Vascular Surgery

## 2017-07-02 VITALS — BP 167/65 | HR 61 | Temp 98.1°F | Resp 20 | Ht 68.5 in | Wt 211.8 lb

## 2017-07-02 DIAGNOSIS — I6523 Occlusion and stenosis of bilateral carotid arteries: Secondary | ICD-10-CM

## 2017-07-02 DIAGNOSIS — I739 Peripheral vascular disease, unspecified: Secondary | ICD-10-CM | POA: Diagnosis not present

## 2017-07-02 LAB — VAS US CAROTID
LCCADSYS: 98 cm/s
LCCAPDIAS: 16 cm/s
LEFT ECA DIAS: -13 cm/s
LICADDIAS: -16 cm/s
LICAPDIAS: -29 cm/s
Left CCA dist dias: 17 cm/s
Left CCA prox sys: 131 cm/s
Left ICA dist sys: -79 cm/s
Left ICA prox sys: -144 cm/s
RCCAPSYS: 85 cm/s
RIGHT CCA MID DIAS: 11 cm/s
RIGHT ECA DIAS: -13 cm/s
Right CCA prox dias: 7 cm/s
Right cca dist sys: -64 cm/s

## 2017-07-02 NOTE — Progress Notes (Signed)
Patient is a 72 year old male who returns for follow-up today.  He has previously undergone left carotid endarterectomy 2017.  He denies any symptoms of TIA amaurosis or stroke.  We are also following him for peripheral arterial disease.  He continues to have very short distance claudication at about a half a block.  He denies rest pain or nonhealing wounds.  He has a history of renal dysfunction with his most recent serum creatinine greater than 2.  He is very frustrated by his walking distance and has significant pain when he walks.  He is on a statin Plavix and aspirin.  Other chronic medical problems remain hypertension and diabetes.  These are both currently controlled.  Review of systems: He denies shortness of breath.  He denies chest pain.  Current Outpatient Medications on File Prior to Visit  Medication Sig Dispense Refill  . acetaminophen (TYLENOL) 325 MG tablet Take 650 mg by mouth daily as needed for mild pain. Reported on 01/03/2016    . amLODipine (NORVASC) 10 MG tablet TAKE 1 TABLET ONCE DAILY. 30 tablet 6  . aspirin EC 81 MG tablet Take 1 tablet (81 mg total) by mouth every other day. (Patient taking differently: Take by mouth. Take 65m once daily on Tues, Thurs, Sat, and Sun)    . B-D ULTRAFINE III SHORT PEN 31G X 8 MM MISC See admin instructions.  0  . carvedilol (COREG) 12.5 MG tablet Take 1 tablet (12.5 mg total) by mouth 2 (two) times daily. 180 tablet 3  . clopidogrel (PLAVIX) 75 MG tablet Take 1 tablet (75 mg total) by mouth daily. 90 tablet 1  . docusate sodium (STOOL SOFTENER) 100 MG capsule Take 100 mg by mouth as needed for mild constipation.    . fenofibrate 160 MG tablet Take 1 tablet (160 mg total) by mouth daily. 90 tablet 3  . furosemide (LASIX) 40 MG tablet Take 120 mg in the morning and 80 mg in the evening  May take an extra 40 mg if needed for 3 lb increase 300 tablet 3  . HUMALOG KWIKPEN 100 UNIT/ML SOPN Inject 10-15 Units into the skin 4 (four) times daily.  Sliding scale  scale = blood sugar level - 100 / 15  (max 15 units)    . hydrALAZINE (APRESOLINE) 100 MG tablet Take 1 tablet (100 mg total) by mouth 3 (three) times daily. 90 tablet 8  . insulin glargine (LANTUS) 100 UNIT/ML injection Inject 40-80 Units 2 (two) times daily into the skin. 46 in the AM and 70 in the PM    . isosorbide mononitrate (IMDUR) 60 MG 24 hr tablet Take 1 tablet (60 mg total) by mouth daily. 90 tablet 3  . NITROSTAT 0.4 MG SL tablet DISSOLVE 1 TABLET UNDER TONGUE AS NEEDED FOR CHEST PAIN,MAY REPEAT IN5 MINUTES FOR 2 DOSES. 25 tablet 1  . pantoprazole (PROTONIX) 40 MG tablet Take 1 tablet (40 mg total) by mouth daily. 90 tablet 3  . rosuvastatin (CRESTOR) 10 MG tablet TAKE 10 MG Tuesday, Thursday,SATURDAY,SUNDAY 90 tablet 3  . VASCEPA 1 G CAPS Take 2 g by mouth 2 (two) times daily.   4   Current Facility-Administered Medications on File Prior to Visit  Medication Dose Route Frequency Provider Last Rate Last Dose  . 0.9 %  sodium chloride infusion   Intravenous Continuous HLeonie Man MD      . 0.9 %  sodium chloride infusion  500 mL Intravenous Continuous PIrene Shipper MD      .  0.9 %  sodium chloride infusion  500 mL Intravenous Continuous Irene Shipper, MD      . 0.9 %  sodium chloride infusion  500 mL Intravenous Continuous Irene Shipper, MD       Allergies  Allergen Reactions  . Atorvastatin Other (See Comments)    Leg pain  . Ibuprofen Hives  . Pravastatin     Leg pain  . Simvastatin Other (See Comments)    Leg pain   Past Medical History:  Diagnosis Date  . Anemia   . Aortic sclerosis  - without Stenosis  March 2014   Echo: Aortic sclerosis without stenosis. EF 55-60% with normal WM; mild concentric hypertrophy with normal relaxation.  Marland Kitchen CAD (coronary artery disease), autologous vein bypass graft 09/2014   10/03/14: Cath for Class III-IV Angina -- 80% ostial-proximal SVG-RPDA (minimal flow down native PDA from native RCA with 50% proximal and distal,  patent LIMA-LAD, SVG-OM with retrograde filling of OM 2. --> 10/12/14: Staged PCI of SVG-RPDA - Xience DES  3.0 mm x 38 mm; (3.4  - 3.3 mm)  . CAD S/P percutaneous coronary angioplasty 06/12/2011; 09/2104   a) 2012: Complex PCI mRCA ([place]) --  2 overlapping Promus element DES stents.; 09/2014:  ost-prox SVG-rPDA - Xience DES 3.0 mm x 38 mm; (3.4  - 3.3 mm)  . CHF (congestive heart failure) ([place])   . Childhood asthma   . CKD (chronic kidney disease), stage III ([place])    "mild; related to diabetes" (10/02/2014)  . Complication of anesthesia    "I've had name recall recognition problems since my last anesthesia"  . DM (diabetes mellitus) type II uncontrolled with eye manifestation ([place]) 2013   Diabetic retinopathy with retinal hemorrhages since;, recurrent in August 2014 treated with Avastin shots  . Dyspnea    mainly with exertion  . GERD (gastroesophageal reflux disease)   . Heart murmur    questionable  . High cholesterol   . Hypertension   . Migraine ~ 2004   "once"  . PAD (peripheral artery disease) ([facility])    with claudication  . PONV (postoperative nausea and vomiting)    s/p tonsillectomy  . 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 07/01/2013 (Cath for [place] Unstable Angina) --> [name]: For LM disease; LIMA-LAD, SVG-RCA, SCG-Cx; b. 09/2014 Cath/Staged PCI: patent LIMA->LAD, VG->OM1/RI, VG->RCA 80% (3.0x38 Xience Alpine DES on 10/12/2014).  . 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  Likely thromboembolic event with PDA occlusion. 06/12/2011   Post Complex PCI, pt states no mi   Past Surgical History:  Procedure Laterality Date  . CARDIAC CATHETERIZATION  06/28/2013   ostial LM disese, RCA ~40-50%ISR  . CAROTID ENDARTERECTOMY Left 05/21/2016  . COLONOSCOPY    . CORONARY ANGIOPLASTY WITH STENT PLACEMENT Right September 2012   2 overlapping Promus DES 2.7 mm at 12 mm x2; postdilated to 3 mm  . CORONARY ARTERY BYPASS GRAFT N/A 07/01/2013    Procedure: CORONARY ARTERY BYPASS GRAFTING (CABG);  Surgeon: Grace Isaac, MD;  Location: [place];  Service: Open Heart Surgery;  Laterality: N/A;  Times 3 using left internal mammary artery and endoscopically harvested bilateral saphenous vein  . ENDARTERECTOMY Left 05/21/2016   Procedure: LEFT CAROTID ENDARTERECTOMY;  Surgeon: Elam Dutch, MD;  Location: [place];  Service: Vascular;  Laterality: Left;  . EYE SURGERY Bilateral    ioc for cataracts  . INTRAOPERATIVE TRANSESOPHAGEAL ECHOCARDIOGRAM N/A 07/01/2013  Procedure: INTRAOPERATIVE TRANSESOPHAGEAL ECHOCARDIOGRAM;  Surgeon: Grace Isaac, MD;  Location: [place];  Service: Open Heart Surgery;  Laterality: N/A;  . LEFT AND RIGHT HEART CATHETERIZATION WITH CORONARY/GRAFT ANGIOGRAM N/A 10/03/2014   Procedure: LEFT AND RIGHT HEART CATHETERIZATION WITH Beatrix Fetters;  Surgeon: Leonie Man, MD;  Location: [facility] CATH LAB;  Service: Cardiovascular;  Laterality: N/A;  . LEFT HEART CATHETERIZATION WITH CORONARY ANGIOGRAM N/A 06/11/2011   Procedure: LEFT HEART CATHETERIZATION WITH CORONARY ANGIOGRAM;  Surgeon: Fulton Reek, MD;  Location: [facility] CATH LAB;  Service: Cardiovascular;  Laterality: N/A;  . LEFT HEART CATHETERIZATION WITH CORONARY ANGIOGRAM N/A 06/28/2013   Procedure: LEFT HEART CATHETERIZATION WITH CORONARY ANGIOGRAM;  Surgeon: Leonie Man, MD;  Location[facility] CATH LAB;  Service: Cardiovascular;  Laterality: N/A;  . NM MYOVIEW LTD  January '13   Diaphragmatic attenuation versus inferior infarct. No ischemia. Normal EF normal wall motion.  Marland Kitchen NM MYOVIEW LTD  June 2014   no ischemia  . NM MYOVIEW LTD  10/2014   LEXISCAN: Normal EF, 61%, No ischemia or infarction.  Mild diaphragmatic attenuation  . PATCH ANGIOPLASTY Left 05/21/2016   Procedure: PATCH ANGIOPLASTY USING HEMASHIELD PLATINUM FINESSE 0.3in x 3 in;  Surgeon: Elam Dutch, MD;  Location: [place];  Service: Vascular;  Laterality: Left;  . PERCUTANEOUS CORONARY STENT  INTERVENTION (PCI-S)  06/11/2011   Procedure: PERCUTANEOUS CORONARY STENT INTERVENTION (PCI-S);  Surgeon: Fulton Reek, MD;  Location: [facility] CATH LAB;  Service: Cardiovascular;;  . PERCUTANEOUS CORONARY STENT INTERVENTION (PCI-S) N/A 10/12/2014   Procedure: PERCUTANEOUS CORONARY STENT INTERVENTION (PCI-S);  Surgeon: Leonie Man, MD;  Location: [facility] CATH LAB;  Ostial-Prox SVG-RCA Xience Alpine DES 3.0 mm x 38 mm; (3.4  - 3.3 mm)  . RETINAL LASER PROCEDURE Bilateral    "more than once"   . TONSILLECTOMY  as child  . TRANSTHORACIC ECHOCARDIOGRAM  03/2017    EF 55-60%.  Normal wall thickness.  No regional wall motion normalities.  GR 2 DD.  Mild-moderate calcified aortic valve.  Mild RV systolic dysfunction.  Moderate RV dilation.  Moderately increased PA pressures.  Domingo Dimes ECHOCARDIOGRAM  April 2016   Mod focal basal & mild overall LVH. EF 55-60%. Mild Ao Sclerosis, Mild MR with MAC. Only minimally elevated RVP.     Physical exam:  Vitals:   07/02/17 1513 07/02/17 1518  BP: (!) 154/62 (!) 167/65  Pulse: 61   Resp: 20   Temp: 98.1 F (36.7 C)   TempSrc: Oral   SpO2: 91%   Weight: 211 lb 12.8 oz (96.1 kg)   Height: 5' 8.5" (1.74 m)     Neck: Bilateral soft carotid bruits left greater than right  Chest: Clear to auscultation bilaterally  Cardiac: Regular rate and rhythm without murmur  Abdomen: Obese soft nontender nondistended  Extremities: Nonpalpable femoral popliteal and pedal pulses bilaterally, feet pink, no ulceration  Data: Patient had bilateral ABIs today which were 0.38 and monophasic on the left 0.48 monophasic on the right this is essentially unchanged from his previous exam 6 months ago.  He also had bilateral carotid duplex exam which showed less than 40% bilaterally.  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  Continue Plavix and statin.  2.  Peripheral arterial disease patient is fairly debilitated by his short  walking distance.  However he understands that a contrast angiogram may make his kidney function worse overall.  I did discuss with him that currently he is not at risk  of limb loss but that we would need to keep a close eye on this continuously and he will need to continue to watch his feet and if he has any ulcers or wounds on his feet they may have difficulty healing and he would need to be reevaluated.  The patient will follow-up in 6 months time for repeat carotid duplex scan and ABIs.  He will call me sooner if he wishes to consider an intervention for his lower extremities.  He understands that this would clearly be for a lifestyle and not limb threatening problems.  Ruta Hinds, MD [facility] Office: [phone] Pager: [phone]

## 2017-07-07 NOTE — Addendum Note (Signed)
Addended by: Lianne Cure A on: 07/07/2017 02:47 PM   Modules accepted: Orders

## 2017-07-21 DIAGNOSIS — H01024 Squamous blepharitis left upper eyelid: Secondary | ICD-10-CM | POA: Diagnosis not present

## 2017-07-21 DIAGNOSIS — H10413 Chronic giant papillary conjunctivitis, bilateral: Secondary | ICD-10-CM | POA: Diagnosis not present

## 2017-07-21 DIAGNOSIS — H01022 Squamous blepharitis right lower eyelid: Secondary | ICD-10-CM | POA: Diagnosis not present

## 2017-07-21 DIAGNOSIS — H04123 Dry eye syndrome of bilateral lacrimal glands: Secondary | ICD-10-CM | POA: Diagnosis not present

## 2017-07-21 DIAGNOSIS — H01021 Squamous blepharitis right upper eyelid: Secondary | ICD-10-CM | POA: Diagnosis not present

## 2017-07-21 DIAGNOSIS — H01025 Squamous blepharitis left lower eyelid: Secondary | ICD-10-CM | POA: Diagnosis not present

## 2017-07-21 DIAGNOSIS — H16143 Punctate keratitis, bilateral: Secondary | ICD-10-CM | POA: Diagnosis not present

## 2017-08-13 DIAGNOSIS — E1139 Type 2 diabetes mellitus with other diabetic ophthalmic complication: Secondary | ICD-10-CM | POA: Diagnosis not present

## 2017-08-13 DIAGNOSIS — Z6831 Body mass index (BMI) 31.0-31.9, adult: Secondary | ICD-10-CM | POA: Diagnosis not present

## 2017-08-13 DIAGNOSIS — I10 Essential (primary) hypertension: Secondary | ICD-10-CM | POA: Diagnosis not present

## 2017-08-13 DIAGNOSIS — N183 Chronic kidney disease, stage 3 (moderate): Secondary | ICD-10-CM | POA: Diagnosis not present

## 2017-08-13 DIAGNOSIS — Z794 Long term (current) use of insulin: Secondary | ICD-10-CM | POA: Diagnosis not present

## 2017-08-25 ENCOUNTER — Other Ambulatory Visit: Payer: Self-pay | Admitting: Cardiology

## 2017-09-14 DIAGNOSIS — M25561 Pain in right knee: Secondary | ICD-10-CM | POA: Diagnosis not present

## 2017-09-15 DIAGNOSIS — E113592 Type 2 diabetes mellitus with proliferative diabetic retinopathy without macular edema, left eye: Secondary | ICD-10-CM | POA: Diagnosis not present

## 2017-09-15 DIAGNOSIS — E113553 Type 2 diabetes mellitus with stable proliferative diabetic retinopathy, bilateral: Secondary | ICD-10-CM | POA: Diagnosis not present

## 2017-09-15 DIAGNOSIS — E1139 Type 2 diabetes mellitus with other diabetic ophthalmic complication: Secondary | ICD-10-CM | POA: Diagnosis not present

## 2017-09-15 DIAGNOSIS — H4312 Vitreous hemorrhage, left eye: Secondary | ICD-10-CM | POA: Diagnosis not present

## 2017-09-15 DIAGNOSIS — E113512 Type 2 diabetes mellitus with proliferative diabetic retinopathy with macular edema, left eye: Secondary | ICD-10-CM | POA: Diagnosis not present

## 2017-09-23 DIAGNOSIS — L570 Actinic keratosis: Secondary | ICD-10-CM | POA: Diagnosis not present

## 2017-09-23 DIAGNOSIS — L820 Inflamed seborrheic keratosis: Secondary | ICD-10-CM | POA: Diagnosis not present

## 2017-09-23 DIAGNOSIS — X32XXXA Exposure to sunlight, initial encounter: Secondary | ICD-10-CM | POA: Diagnosis not present

## 2017-10-09 DIAGNOSIS — R05 Cough: Secondary | ICD-10-CM | POA: Diagnosis not present

## 2017-10-14 DIAGNOSIS — E1122 Type 2 diabetes mellitus with diabetic chronic kidney disease: Secondary | ICD-10-CM | POA: Diagnosis not present

## 2017-10-14 DIAGNOSIS — I739 Peripheral vascular disease, unspecified: Secondary | ICD-10-CM | POA: Diagnosis not present

## 2017-10-14 DIAGNOSIS — I2510 Atherosclerotic heart disease of native coronary artery without angina pectoris: Secondary | ICD-10-CM | POA: Diagnosis not present

## 2017-10-14 DIAGNOSIS — I129 Hypertensive chronic kidney disease with stage 1 through stage 4 chronic kidney disease, or unspecified chronic kidney disease: Secondary | ICD-10-CM | POA: Diagnosis not present

## 2017-10-14 DIAGNOSIS — N183 Chronic kidney disease, stage 3 (moderate): Secondary | ICD-10-CM | POA: Diagnosis not present

## 2017-10-14 DIAGNOSIS — Z6831 Body mass index (BMI) 31.0-31.9, adult: Secondary | ICD-10-CM | POA: Diagnosis not present

## 2017-10-28 DIAGNOSIS — D508 Other iron deficiency anemias: Secondary | ICD-10-CM | POA: Diagnosis not present

## 2017-10-28 DIAGNOSIS — I5032 Chronic diastolic (congestive) heart failure: Secondary | ICD-10-CM | POA: Diagnosis not present

## 2017-10-28 DIAGNOSIS — Z794 Long term (current) use of insulin: Secondary | ICD-10-CM | POA: Diagnosis not present

## 2017-10-28 DIAGNOSIS — E113599 Type 2 diabetes mellitus with proliferative diabetic retinopathy without macular edema, unspecified eye: Secondary | ICD-10-CM | POA: Diagnosis not present

## 2017-10-28 DIAGNOSIS — E1139 Type 2 diabetes mellitus with other diabetic ophthalmic complication: Secondary | ICD-10-CM | POA: Diagnosis not present

## 2017-10-28 DIAGNOSIS — E7849 Other hyperlipidemia: Secondary | ICD-10-CM | POA: Diagnosis not present

## 2017-10-28 DIAGNOSIS — I25810 Atherosclerosis of coronary artery bypass graft(s) without angina pectoris: Secondary | ICD-10-CM | POA: Diagnosis not present

## 2017-10-28 DIAGNOSIS — I10 Essential (primary) hypertension: Secondary | ICD-10-CM | POA: Diagnosis not present

## 2017-10-28 DIAGNOSIS — E1142 Type 2 diabetes mellitus with diabetic polyneuropathy: Secondary | ICD-10-CM | POA: Diagnosis not present

## 2017-10-28 DIAGNOSIS — E211 Secondary hyperparathyroidism, not elsewhere classified: Secondary | ICD-10-CM | POA: Diagnosis not present

## 2017-10-28 DIAGNOSIS — N184 Chronic kidney disease, stage 4 (severe): Secondary | ICD-10-CM | POA: Diagnosis not present

## 2017-10-28 DIAGNOSIS — I739 Peripheral vascular disease, unspecified: Secondary | ICD-10-CM | POA: Diagnosis not present

## 2017-10-28 DIAGNOSIS — R946 Abnormal results of thyroid function studies: Secondary | ICD-10-CM | POA: Diagnosis not present

## 2017-10-30 ENCOUNTER — Other Ambulatory Visit: Payer: Self-pay | Admitting: Cardiology

## 2017-10-30 NOTE — Telephone Encounter (Signed)
Rx(s) sent to pharmacy electronically.  

## 2017-11-11 ENCOUNTER — Encounter: Payer: Self-pay | Admitting: Internal Medicine

## 2017-11-23 ENCOUNTER — Ambulatory Visit (INDEPENDENT_AMBULATORY_CARE_PROVIDER_SITE_OTHER): Payer: Medicare Other | Admitting: Cardiology

## 2017-11-23 ENCOUNTER — Encounter: Payer: Self-pay | Admitting: Cardiology

## 2017-11-23 VITALS — BP 115/53 | HR 51 | Ht 68.5 in | Wt 211.2 lb

## 2017-11-23 DIAGNOSIS — E785 Hyperlipidemia, unspecified: Secondary | ICD-10-CM

## 2017-11-23 DIAGNOSIS — Z9861 Coronary angioplasty status: Secondary | ICD-10-CM | POA: Diagnosis not present

## 2017-11-23 DIAGNOSIS — I10 Essential (primary) hypertension: Secondary | ICD-10-CM | POA: Diagnosis not present

## 2017-11-23 DIAGNOSIS — I2510 Atherosclerotic heart disease of native coronary artery without angina pectoris: Secondary | ICD-10-CM

## 2017-11-23 DIAGNOSIS — I209 Angina pectoris, unspecified: Secondary | ICD-10-CM

## 2017-11-23 DIAGNOSIS — I5032 Chronic diastolic (congestive) heart failure: Secondary | ICD-10-CM | POA: Diagnosis not present

## 2017-11-23 DIAGNOSIS — I70219 Atherosclerosis of native arteries of extremities with intermittent claudication, unspecified extremity: Secondary | ICD-10-CM | POA: Diagnosis not present

## 2017-11-23 DIAGNOSIS — N182 Chronic kidney disease, stage 2 (mild): Secondary | ICD-10-CM

## 2017-11-23 DIAGNOSIS — R600 Localized edema: Secondary | ICD-10-CM

## 2017-11-23 NOTE — Progress Notes (Signed)
1 a little bit on and swelling no better   PCP: Reynold Bowen, MD  Clinic Note: Chief Complaint  Patient presents with  . Follow-up  . Shortness of Breath    with activity,   . Coronary Artery Disease  . PAD     having lots of leg pain all the time     HPI: Lance Collier is a 72 y.o. male with a PMH below who presents today for ~72-monthfollow-up for CAD-PCI-CABG and diastolic heart failure. He also has significant PAD that is pretty much non-revascularizable. He is limited a lot by his PAD.  WKemo SpruceJMartiniquewas last seen in Nov 2018 - doing well.  Noticeably less dyspneic & less edematous following up-titration of Lasix.  Recent Hospitalizations: None   n/a  Studies Personally Reviewed - (if available, images/films reviewed: From Epic Chart or Care Everywhere)  n/a  Interval History: WPasqualereturns today actually relatively stable from a cardiac standpoint.  He has some congestion from a cold and allergies and has his baseline exertional dyspnea, but he clearly indicates being out of shape since he has not been able to do hardly anything related to his bilateral leg claudication.  He is seriously considering proceeding with peripheral vascular intervention.  He understands that may need to be a staged procedure, but he needs to have something done because he cannot do any exercise.  He is not able to hardly walk even around his house. He does not have any chest tightness or pressure to suggest any recurrence of angina.  He has no resting dyspnea or PND/orthopnea but does have some mild edema.  He denies any rapid irregular heartbeats or palpitations just occasional flip-flops.  Nothing prolonged.  No arrhythmias.  No syncope/near syncope or TIA/amaurosis fugax.  He does get some positional dizziness. No bleeding issues such as melena, hematochezia, hematuria or epistaxis.  Mild bruising but not overly worrisome.  He is a little bit upset, because with his now sedentary  lifestyle, he has gained weight.  ROS: A comprehensive was performed.  Pertinent positives and negatives as above Review of Systems  Constitutional: Positive for malaise/fatigue (But energy seems better than it was last visit.).  HENT: Negative for congestion.   Respiratory: Negative for cough and wheezing.   Cardiovascular: Positive for claudication.  Gastrointestinal: Negative for abdominal pain and heartburn.  Genitourinary:       Nocturia  Musculoskeletal: Positive for joint pain. Negative for falls.  Neurological: Positive for dizziness (Balance issues.) and tingling (Peripheral neuropathy).  Psychiatric/Behavioral: The patient has insomnia.        He denies depression, but seems to have a somewhat depressed mood.  A little better today than last visit.  All other systems reviewed and are negative.  I have reviewed and (if needed) personally updated the patient's problem list, medications, allergies, past medical and surgical history, social and family history.   Past Medical History:  Diagnosis Date  . Anemia   . Aortic sclerosis  - without Stenosis  March 2014   Echo: Aortic sclerosis without stenosis. EF 55-60% with normal WM; mild concentric hypertrophy with normal relaxation.  .Marland KitchenCAD (coronary artery disease), autologous vein bypass graft 09/2014   10/03/14: Cath for Class III-IV Angina -- 80% ostial-proximal SVG-RPDA (minimal flow down native PDA from native RCA with 50% proximal and distal, patent LIMA-LAD, SVG-OM with retrograde filling of OM 2. --> 10/12/14: Staged PCI of SVG-RPDA - Xience DES  3.0 mm x 38 mm; (3.4  -  3.3 mm)  . CAD S/P percutaneous coronary angioplasty 06/12/2011; 09/2104   a) 2012: Complex PCI mRCA ([place]) --  2 overlapping Promus element DES stents.; 09/2014:  ost-prox SVG-rPDA - Xience DES 3.0 mm x 38 mm; (3.4  - 3.3 mm)  . CHF (congestive heart failure) ([place])   . Childhood asthma   . CKD (chronic kidney disease), stage III ([place])    "mild; related to  diabetes" (10/02/2014)  . Complication of anesthesia    "I've had name recall recognition problems since my last anesthesia"  . DM (diabetes mellitus) type II uncontrolled with eye manifestation ([place]) 2013   Diabetic retinopathy with retinal hemorrhages since;, recurrent in August 2014 treated with Avastin shots  . Dyspnea    mainly with exertion  . GERD (gastroesophageal reflux disease)   . Heart murmur    questionable  . High cholesterol   . Hypertension   . Migraine ~ 2004   "once"  . PAD (peripheral artery disease) (HCC)    with claudication  . PONV (postoperative nausea and vomiting)    s/p tonsillectomy  . 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 07/01/2013 (Cath for Crescendo Unstable Angina) --> [name]: For LM disease; LIMA-LAD, SVG-RCA, SCG-Cx; b. 09/2014 Cath/Staged PCI: patent LIMA->LAD, VG->OM1/RI, VG->RCA 80% (3.0x38 Xience Alpine DES on 10/12/2014).  . 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  Likely thromboembolic event with PDA occlusion. 06/12/2011   Post Complex PCI, pt states no mi    Past Surgical History:  Procedure Laterality Date  . CARDIAC CATHETERIZATION  06/28/2013   ostial LM disese, RCA ~40-50%ISR  . CAROTID ENDARTERECTOMY Left 05/21/2016  . COLONOSCOPY    . CORONARY ANGIOPLASTY WITH STENT PLACEMENT Right September 2012   2 overlapping Promus DES 2.7 mm at 12 mm x2; postdilated to 3 mm  . CORONARY ARTERY BYPASS GRAFT N/A 07/01/2013   Procedure: CORONARY ARTERY BYPASS GRAFTING (CABG);  Surgeon: Grace Isaac, MD;  Location: [place];  Service: Open Heart Surgery;  Laterality: N/A;  Times 3 using left internal mammary artery and endoscopically harvested bilateral saphenous vein  . ENDARTERECTOMY Left 05/21/2016   Procedure: LEFT CAROTID ENDARTERECTOMY;  Surgeon: Elam Dutch, MD;  Location: [place];  Service: Vascular;  Laterality: Left;  . EYE SURGERY Bilateral    ioc for cataracts  . INTRAOPERATIVE TRANSESOPHAGEAL  ECHOCARDIOGRAM N/A 07/01/2013   Procedure: INTRAOPERATIVE TRANSESOPHAGEAL ECHOCARDIOGRAM;  Surgeon: Grace Isaac, MD;  Location: [place];  Service: Open Heart Surgery;  Laterality: N/A;  . LEFT AND RIGHT HEART CATHETERIZATION WITH CORONARY/GRAFT ANGIOGRAM N/A 10/03/2014   Procedure: LEFT AND RIGHT HEART CATHETERIZATION WITH Beatrix Fetters;  Surgeon: Leonie Man, MD;  Location[facility] CATH LAB;  Service: Cardiovascular;  Laterality: N/A;  . LEFT HEART CATHETERIZATION WITH CORONARY ANGIOGRAM N/A 06/11/2011   Procedure: LEFT HEART CATHETERIZATION WITH CORONARY ANGIOGRAM;  Surgeon: Fulton Reek, MD;  Location: [facility] CATH LAB;  Service: Cardiovascular;  Laterality: N/A;  . LEFT HEART CATHETERIZATION WITH CORONARY ANGIOGRAM N/A 06/28/2013   Procedure: LEFT HEART CATHETERIZATION WITH CORONARY ANGIOGRAM;  Surgeon: Leonie Man, MD;  Location: [facility] CATH LAB;  Service: Cardiovascular;  Laterality: N/A;  . NM MYOVIEW LTD  January '13   Diaphragmatic attenuation versus inferior infarct. No ischemia. Normal EF normal wall motion.  Marland Kitchen NM MYOVIEW LTD  June 2014   no ischemia  . NM MYOVIEW LTD  10/2014   LEXISCAN: Normal EF, 61%, No ischemia or  infarction.  Mild diaphragmatic attenuation  . PATCH ANGIOPLASTY Left 05/21/2016   Procedure: PATCH ANGIOPLASTY USING HEMASHIELD PLATINUM FINESSE 0.3in x 3 in;  Surgeon: Elam Dutch, MD;  Location: [place];  Service: Vascular;  Laterality: Left;  . PERCUTANEOUS CORONARY STENT INTERVENTION (PCI-S)  06/11/2011   Procedure: PERCUTANEOUS CORONARY STENT INTERVENTION (PCI-S);  Surgeon: Fulton Reek, MD;  Location: [facility] CATH LAB;  Service: Cardiovascular;;  . PERCUTANEOUS CORONARY STENT INTERVENTION (PCI-S) N/A 10/12/2014   Procedure: PERCUTANEOUS CORONARY STENT INTERVENTION (PCI-S);  Surgeon: Leonie Man, MD;  Location: [facility] CATH LAB;  Ostial-Prox SVG-RCA Xience Alpine DES 3.0 mm x 38 mm; (3.4  - 3.3 mm)  . RETINAL LASER PROCEDURE Bilateral    "more than once"   .  TONSILLECTOMY  as child  . TRANSTHORACIC ECHOCARDIOGRAM  03/2017    EF 55-60%.  Normal wall thickness.  No regional wall motion normalities.  GR 2 DD.  Mild-moderate calcified aortic valve.  Mild RV systolic dysfunction.  Moderate RV dilation.  Moderately increased PA pressures.  Domingo Dimes ECHOCARDIOGRAM  April 2016   Mod focal basal & mild overall LVH. EF 55-60%. Mild Ao Sclerosis, Mild MR with MAC. Only minimally elevated RVP.    Current Meds  Medication Sig  . acetaminophen (TYLENOL) 325 MG tablet Take 650 mg by mouth daily as needed for mild pain. Reported on 01/03/2016  . amLODipine (NORVASC) 10 MG tablet TAKE 1 TABLET ONCE DAILY.  Marland Kitchen aspirin EC 81 MG tablet Take 1 tablet (81 mg total) by mouth every other day. (Patient taking differently: Take by mouth. Take 31m once daily on Tues, Thurs, Sat, and Sun)  . B-D ULTRAFINE III SHORT PEN 31G X 8 MM MISC See admin instructions.  . carvedilol (COREG) 12.5 MG tablet Take 1 tablet (12.5 mg total) by mouth 2 (two) times daily.  . clopidogrel (PLAVIX) 75 MG tablet TAKE 1 TABLET DAILY WITH BREAKFAST.  .Marland Kitchendocusate sodium (STOOL SOFTENER) 100 MG capsule Take 100 mg by mouth as needed for mild constipation.  . fenofibrate 160 MG tablet Take 1 tablet (160 mg total) by mouth daily.  . furosemide (LASIX) 40 MG tablet Take 120 mg in the morning and 80 mg in the evening  May take an extra 40 mg if needed for 3 lb increase  . HUMALOG KWIKPEN 200 UNIT/ML SOPN Inject 20 Units as directed as directed.  . hydrALAZINE (APRESOLINE) 100 MG tablet Take 1 tablet (100 mg total) by mouth 3 (three) times daily.  . insulin glargine (LANTUS) 100 UNIT/ML injection Inject 40-80 Units 2 (two) times daily into the skin. 46 in the AM and 70 in the PM  . isosorbide mononitrate (IMDUR) 60 MG 24 hr tablet Take 1 tablet (60 mg total) by mouth daily.  .Marland KitchenNITROSTAT 0.4 MG SL tablet DISSOLVE 1 TABLET UNDER TONGUE AS NEEDED FOR CHEST PAIN,MAY REPEAT IN5 MINUTES FOR 2 DOSES.  .Marland Kitchen pantoprazole (PROTONIX) 40 MG tablet Take 1 tablet (40 mg total) by mouth daily.  . rosuvastatin (CRESTOR) 10 MG tablet TAKE 10 MG Tuesday, Thursday,SATURDAY,SUNDAY  . TOUJEO MAX SOLOSTAR 300 UNIT/ML SOPN Inject 70 Units as directed 2 (two) times daily.  .Marland KitchenVASCEPA 1 G CAPS Take 2 g by mouth 2 (two) times daily.    Current Facility-Administered Medications for the 11/23/17 encounter (Office Visit) with HLeonie Man MD  Medication  . 0.9 %  sodium chloride infusion  . 0.9 %  sodium chloride infusion  . 0.9 %  sodium  chloride infusion  . 0.9 %  sodium chloride infusion    Allergies  Allergen Reactions  . Atorvastatin Other (See Comments)    Leg pain  . Ibuprofen Hives  . Pravastatin     Leg pain  . Simvastatin Other (See Comments)    Leg pain    Social History   Tobacco Use  . Smoking status: Former Smoker    Packs/day: 2.00    Years: 27.00    Pack years: 54.00    Types: Cigarettes    Last attempt to quit: 11/23/1996    Years since quitting: 21.0  . Smokeless tobacco: Never Used  Substance Use Topics  . Alcohol use: Yes    Alcohol/week: 0.0 oz    Comment: rarely  . 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 He notably has not been exercising like he used to. He [place] walk [place] and [place]. He has social alcoholic beverages but is trying to cut that back and does not smoke.       He tells me he is a sucker for chips but does not eat sweets because of diabetes.     family history includes Breast cancer in his mother and sister; Colon polyps in his father; Hyperlipidemia in his father; Hypertension in his father; Liver cancer in his maternal grandmother; Lung cancer in his sister; Throat cancer in his father.  Wt Readings from Last 3 Encounters:  11/23/17 211  lb 3.2 oz (95.8 kg)  07/02/17 211 lb 12.8 oz (96.1 kg)  05/08/17 208 lb (94.3 kg)     PHYSICAL EXAM BP (!) 115/53   Pulse (!) 51   Ht 5' 8.5" (1.74 m)   Wt 211 lb 3.2 oz (95.8 kg)   SpO2 96%   BMI 31.65 kg/m  Physical Exam  Constitutional: He is oriented to person, place, and time. He appears well-developed and well-nourished.  Mild to moderately obese (truncal). Well-groomed  HENT:  Head: Normocephalic and atraumatic.  Left eye is still mildly bloodshot, but no longer puffy/swollen.  Eyes:  Eyes look both relatively clear bilaterally  Neck: Normal range of motion. Neck supple. Hepatojugular reflux present. No JVD present. Carotid bruit is present.  Cardiovascular: Normal rate. A regularly irregular rhythm present. Frequent extrasystoles are present. Exam reveals distant heart sounds and decreased pulses (Bilateral pedal pulses). Exam reveals no gallop.  Murmur heard.  Low-pitched harsh crescendo-decrescendo early systolic murmur is present with a grade of 2/6. High-pitched blowing early systolic murmur is also present at the apex radiating to the apex. Pulses:      Femoral pulses are on the right side with bruit, and on the left side with bruit. Normal S1 and physiologically split S2.  Pulmonary/Chest: Breath sounds normal. No respiratory distress. He has no wheezes. He has no rales.  Mildly diminished basal sounds, but likely reduced effort.  Abdominal: Soft. Bowel sounds are normal. He exhibits no distension. There is no tenderness. There is no rebound.  Truncal obesity, no HSM  Musculoskeletal: Normal range of motion. He exhibits edema (Trivial bilateral).  Slow, deliberate gait.  Neurological: He is alert and oriented to person, place, and time.  Psychiatric: He has a normal mood and affect. His behavior is normal. Judgment and  thought content normal.  As usual he seems to have a somewhat subdued mood. Not depressed however     Adult ECG Report n/a  Other studies  Reviewed: Additional studies/ records that were reviewed today include:  Recent Labs: last labs available -    Oct 29, 2017: Total cholesterol 232,Triglycerides 418, LDL 126 and HDL 22    ASSESSMENT / PLAN: Problem List Items Addressed This Visit    Hyperlipidemia with target LDL less than 70; intolerance to atorvastatin and simvastatin (Chronic)    He is now taking Crestor 3 to 4 days a week and tolerating it okay, but his lipids are nowhere near goal.  He has a very atherogenic metabolic panel.  We will refer to [facility] for evaluation for possible PCSK9 inhibitor.  May also benefit from fenofibrate given triglyceride elevation..      Essential hypertension (Chronic)    Blood pressure is stable on current regiment.  No change.      Edema of both legs (Chronic)    Probably has venous stasis but also has some diastolic dysfunction.  On stable dose of Lasix.      Chronic kidney disease (CKD), stage II (mild) (Chronic)    He has class II-III CKD followed by nephrology.  At this point I think he has lifestyle limiting claudication and the need to do lower extremity evaluations.  I would imagine that they will probably do staged interventions.      Chronic diastolic CHF (congestive heart failure), NYHA class 2 (HCC) (Chronic)    Overall stable, class II symptoms now.  Back on his standing dose of Lasix with very infrequent as needed dosing.  He is gaining weight, but is probably more related to his deconditioning. Continue amlodipine along with hydralazine-nitrate for afterload reduction.  Defer to nephrologist as to whether or not we would potentially consider ACE inhibitor or ARB, but currently not on it because of chronic renal insufficiency.      CAD S/P PCI SVG-RCA Xience Alpine DES 3.0 mm x 38 mm (ostial-prox) - 3.83m - Primary (Chronic)    Status post CABG with no further symptoms of angina.  Seems to have more heart failure/volume overload symptoms. For anginal control he is on  amlodipine, nitrate and beta-blocker. He is still on aspirin Plavix as much for his PAD has CAD.   Stable.  No change Last Myoview was 2016, nonischemic --   Would not automatically consider follow-up nuclear stress test in 1 year unless symptoms warrant.       Atherosclerotic peripheral vascular disease with intermittent claudication (HCC) (Chronic)    Limiting claudication.  Now planning on proceeding with peripheral vascular intervention. Once he is able to start walking without claudication, we can then better assess his dyspnea/angina symptoms. He is on aggressive cardiovascular risk reduction Therapy.      Angina, class I ([place] (Chronic)    Angina seems to be well controlled.  Is on a combination of nitrate, amlodipine and beta-blocker.         Mr. JMartiniqueis very complicated with several cardiac issues and severely elevated cholesterol levels.  I spent close to 45 minutes with the patient.  >50% of the time was spent in direct patient consultation.  Current medicines are reviewed at length with the patient today. (+/- concerns) n/a The following changes have been made: n/a  Patient Instructions  MEDICATION  INSTRUCTIONS   REPATHA will be started for cholesterol- [facility] PHARMACIST  WILL BE IN CONTACT WITH  YOU.  NO OTHER CHANGES   Your physician wants you to follow-up in [place].You will receive a reminder letter in the mail two months in advance. If you don't receive a letter, please call our office to schedule the follow-up appointment.   If you need a refill on your cardiac medications before your next appointment, please call your pharmacy.    Studies Ordered:   No orders of the defined types were placed in this encounter.     Glenetta Hew, M.D., M.S. Interventional Cardiologist   Pager # [phone] Phone # [phone] [address] [place], [place] [address]

## 2017-11-23 NOTE — Patient Instructions (Addendum)
MEDICATION  INSTRUCTIONS   REPATHA will be started for cholesterol- [facility] PHARMACIST  WILL BE IN CONTACT WITH YOU.  NO OTHER CHANGES   Your physician wants you to follow-up in [place].You will receive a reminder letter in the mail two months in advance. If you don't receive a letter, please call our office to schedule the follow-up appointment.   If you need a refill on your cardiac medications before your next appointment, please call your pharmacy.

## 2017-11-24 ENCOUNTER — Encounter: Payer: Self-pay | Admitting: Cardiology

## 2017-11-24 DIAGNOSIS — M1711 Unilateral primary osteoarthritis, right knee: Secondary | ICD-10-CM | POA: Diagnosis not present

## 2017-11-24 NOTE — Assessment & Plan Note (Signed)
Overall stable, class II symptoms now.  Back on his standing dose of Lasix with very infrequent as needed dosing.  He is gaining weight, but is probably more related to his deconditioning. Continue amlodipine along with hydralazine-nitrate for afterload reduction.  Defer to nephrologist as to whether or not we would potentially consider ACE inhibitor or ARB, but currently not on it because of chronic renal insufficiency.

## 2017-11-24 NOTE — Assessment & Plan Note (Signed)
Angina seems to be well controlled.  Is on a combination of nitrate, amlodipine and beta-blocker.

## 2017-11-24 NOTE — Assessment & Plan Note (Signed)
He is now taking Crestor 3 to 4 days a week and tolerating it okay, but his lipids are nowhere near goal.  He has a very atherogenic metabolic panel.  We will refer to [facility] for evaluation for possible PCSK9 inhibitor.  May also benefit from fenofibrate given triglyceride elevation.Lance Collier

## 2017-11-24 NOTE — Assessment & Plan Note (Signed)
He has class II-III CKD followed by nephrology.  At this point I think he has lifestyle limiting claudication and the need to do lower extremity evaluations.  I would imagine that they will probably do staged interventions.

## 2017-11-24 NOTE — Assessment & Plan Note (Signed)
Status post CABG with no further symptoms of angina.  Seems to have more heart failure/volume overload symptoms. For anginal control he is on amlodipine, nitrate and beta-blocker. He is still on aspirin Plavix as much for his PAD has CAD.   Stable.  No change Last Myoview was 2016, nonischemic --   Would not automatically consider follow-up nuclear stress test in 1 year unless symptoms warrant.

## 2017-11-24 NOTE — Assessment & Plan Note (Signed)
Blood pressure is stable on current regiment.  No change.

## 2017-11-24 NOTE — Assessment & Plan Note (Signed)
Limiting claudication.  Now planning on proceeding with peripheral vascular intervention. Once he is able to start walking without claudication, we can then better assess his dyspnea/angina symptoms. He is on aggressive cardiovascular risk reduction Therapy.

## 2017-11-24 NOTE — Assessment & Plan Note (Signed)
Probably has venous stasis but also has some diastolic dysfunction.  On stable dose of Lasix.

## 2017-12-04 ENCOUNTER — Other Ambulatory Visit: Payer: Self-pay | Admitting: Pharmacist Clinician (PhC)/ Clinical Pharmacy Specialist

## 2017-12-04 MED ORDER — EVOLOCUMAB 140 MG/ML ~~LOC~~ SOAJ: 140.0000 mg | SUBCUTANEOUS | 12 refills | Status: DC

## 2017-12-07 DIAGNOSIS — M1711 Unilateral primary osteoarthritis, right knee: Secondary | ICD-10-CM | POA: Diagnosis not present

## 2017-12-08 DIAGNOSIS — Z794 Long term (current) use of insulin: Secondary | ICD-10-CM | POA: Diagnosis not present

## 2017-12-08 DIAGNOSIS — Z6831 Body mass index (BMI) 31.0-31.9, adult: Secondary | ICD-10-CM | POA: Diagnosis not present

## 2017-12-08 DIAGNOSIS — E1139 Type 2 diabetes mellitus with other diabetic ophthalmic complication: Secondary | ICD-10-CM | POA: Diagnosis not present

## 2017-12-08 DIAGNOSIS — N184 Chronic kidney disease, stage 4 (severe): Secondary | ICD-10-CM | POA: Diagnosis not present

## 2017-12-08 DIAGNOSIS — I10 Essential (primary) hypertension: Secondary | ICD-10-CM | POA: Diagnosis not present

## 2017-12-14 DIAGNOSIS — M1711 Unilateral primary osteoarthritis, right knee: Secondary | ICD-10-CM | POA: Diagnosis not present

## 2017-12-31 ENCOUNTER — Other Ambulatory Visit: Payer: Self-pay

## 2017-12-31 ENCOUNTER — Ambulatory Visit (INDEPENDENT_AMBULATORY_CARE_PROVIDER_SITE_OTHER): Payer: Medicare Other | Admitting: Vascular Surgery

## 2017-12-31 ENCOUNTER — Ambulatory Visit (INDEPENDENT_AMBULATORY_CARE_PROVIDER_SITE_OTHER)
Admission: RE | Admit: 2017-12-31 | Discharge: 2017-12-31 | Disposition: A | Discharge status: Home / Self Care | Payer: Medicare Other | Source: Ambulatory Visit | Attending: Vascular Surgery | Admitting: Vascular Surgery

## 2017-12-31 ENCOUNTER — Ambulatory Visit (HOSPITAL_COMMUNITY)
Admission: RE | Admit: 2017-12-31 | Discharge: 2017-12-31 | Disposition: A | Discharge status: Home / Self Care | Payer: Medicare Other | Source: Ambulatory Visit | Attending: Vascular Surgery | Admitting: Vascular Surgery

## 2017-12-31 ENCOUNTER — Encounter: Payer: Self-pay | Admitting: Vascular Surgery

## 2017-12-31 VITALS — BP 131/60 | HR 56 | Temp 97.8°F | Resp 16 | Ht 68.5 in | Wt 212.0 lb

## 2017-12-31 DIAGNOSIS — I739 Peripheral vascular disease, unspecified: Secondary | ICD-10-CM | POA: Diagnosis not present

## 2017-12-31 DIAGNOSIS — I708 Atherosclerosis of other arteries: Secondary | ICD-10-CM | POA: Diagnosis not present

## 2017-12-31 DIAGNOSIS — E785 Hyperlipidemia, unspecified: Secondary | ICD-10-CM | POA: Insufficient documentation

## 2017-12-31 DIAGNOSIS — I209 Angina pectoris, unspecified: Secondary | ICD-10-CM

## 2017-12-31 DIAGNOSIS — R0989 Other specified symptoms and signs involving the circulatory and respiratory systems: Secondary | ICD-10-CM | POA: Diagnosis present

## 2017-12-31 DIAGNOSIS — I6522 Occlusion and stenosis of left carotid artery: Secondary | ICD-10-CM | POA: Diagnosis not present

## 2017-12-31 DIAGNOSIS — I6523 Occlusion and stenosis of bilateral carotid arteries: Secondary | ICD-10-CM | POA: Insufficient documentation

## 2017-12-31 DIAGNOSIS — R9389 Abnormal findings on diagnostic imaging of other specified body structures: Secondary | ICD-10-CM | POA: Diagnosis not present

## 2017-12-31 DIAGNOSIS — Z87891 Personal history of nicotine dependence: Secondary | ICD-10-CM | POA: Insufficient documentation

## 2017-12-31 DIAGNOSIS — E1151 Type 2 diabetes mellitus with diabetic peripheral angiopathy without gangrene: Secondary | ICD-10-CM | POA: Diagnosis not present

## 2017-12-31 DIAGNOSIS — I10 Essential (primary) hypertension: Secondary | ICD-10-CM | POA: Diagnosis not present

## 2017-12-31 DIAGNOSIS — I2510 Atherosclerotic heart disease of native coronary artery without angina pectoris: Secondary | ICD-10-CM | POA: Diagnosis not present

## 2017-12-31 NOTE — Progress Notes (Signed)
Patient name: Lance Collier MRN: [id number] DOB: 06-20-1946 Sex: male  HPI: Lance Collier is a 72 y.o. male who returns today for further follow-up regarding carotid and peripheral arterial occlusive disease.  He underwent left carotid endarterectomy 2017.  He continues to deny any new symptoms of TIA amaurosis or stroke.  He still has debilitating lower extremity claudication walking minimal distance.  He denies rest pain.  He has been concerned about an intervention in the past secondary to his renal dysfunction.  He is recently seen his nephrologist who thought that he would be at low risk overall for being on dialysis if he developed contrast nephropathy.  Other medical problems include hypertension and diabetes both of which are currently controlled.  He is on aspirin Plavix and a statin.  Review of systems: He denies shortness of breath.  He denies chest pain.  Past Medical History:  Diagnosis Date  . Anemia   . Aortic sclerosis  - without Stenosis  March 2014   Echo: Aortic sclerosis without stenosis. EF 55-60% with normal WM; mild concentric hypertrophy with normal relaxation.  Marland Kitchen CAD (coronary artery disease), autologous vein bypass graft 09/2014   10/03/14: Cath for Class III-IV Angina -- 80% ostial-proximal SVG-RPDA (minimal flow down native PDA from native RCA with 50% proximal and distal, patent LIMA-LAD, SVG-OM with retrograde filling of OM 2. --> 10/12/14: Staged PCI of SVG-RPDA - Xience DES  3.0 mm x 38 mm; (3.4  - 3.3 mm)  . CAD S/P percutaneous coronary angioplasty 06/12/2011; 09/2104   a) 2012: Complex PCI mRCA ([place]) --  2 overlapping Promus element DES stents.; 09/2014:  ost-prox SVG-rPDA - Xience DES 3.0 mm x 38 mm; (3.4  - 3.3 mm)  . CHF (congestive heart failure) ([place])   . Childhood asthma   . CKD (chronic kidney disease), stage III ([place])    "mild; related to diabetes" (10/02/2014)  . Complication of anesthesia    "I've had name recall recognition problems since  my last anesthesia"  . DM (diabetes mellitus) type II uncontrolled with eye manifestation ([place]) 2013   Diabetic retinopathy with retinal hemorrhages since;, recurrent in August 2014 treated with Avastin shots  . Dyspnea    mainly with exertion  . GERD (gastroesophageal reflux disease)   . Heart murmur    questionable  . High cholesterol   . Hypertension   . Migraine ~ 2004   "once"  . PAD (peripheral artery disease) ([facility])    with claudication  . PONV (postoperative nausea and vomiting)    s/p tonsillectomy  . 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 07/01/2013 (Cath for [place] Unstable Angina) --> [name]: For LM disease; LIMA-LAD, SVG-RCA, SCG-Cx; b. 09/2014 Cath/Staged PCI: patent LIMA->LAD, VG->OM1/RI, VG->RCA 80% (3.0x38 Xience Alpine DES on 10/12/2014).  . 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  Likely thromboembolic event with PDA occlusion. 06/12/2011   Post Complex PCI, pt states no mi   Past Surgical History:  Procedure Laterality Date  . CARDIAC CATHETERIZATION  06/28/2013   ostial LM disese, RCA ~40-50%ISR  . CAROTID ENDARTERECTOMY Left 05/21/2016  . COLONOSCOPY    . CORONARY ANGIOPLASTY WITH STENT PLACEMENT Right September 2012   2 overlapping Promus DES 2.7 mm at 12 mm x2; postdilated to 3 mm  . CORONARY ARTERY BYPASS GRAFT N/A 07/01/2013   Procedure: CORONARY ARTERY BYPASS GRAFTING (CABG);  Surgeon: Grace Isaac, MD;  Location: [place];  Service: Open Heart Surgery;  Laterality: N/A;  Times 3 using left internal mammary artery and endoscopically harvested bilateral saphenous vein  . ENDARTERECTOMY Left 05/21/2016   Procedure: LEFT CAROTID ENDARTERECTOMY;  Surgeon: Elam Dutch, MD;  Location: [place];  Service: Vascular;  Laterality: Left;  . EYE SURGERY Bilateral    ioc for cataracts  . INTRAOPERATIVE TRANSESOPHAGEAL ECHOCARDIOGRAM N/A 07/01/2013   Procedure: INTRAOPERATIVE TRANSESOPHAGEAL ECHOCARDIOGRAM;  Surgeon: Grace Isaac, MD;  Location: [place];  Service: Open Heart Surgery;  Laterality: N/A;  . LEFT AND RIGHT HEART CATHETERIZATION WITH CORONARY/GRAFT ANGIOGRAM N/A 10/03/2014   Procedure: LEFT AND RIGHT HEART CATHETERIZATION WITH Beatrix Fetters;  Surgeon: Leonie Man, MD;  Location: [facility] CATH LAB;  Service: Cardiovascular;  Laterality: N/A;  . LEFT HEART CATHETERIZATION WITH CORONARY ANGIOGRAM N/A 06/11/2011   Procedure: LEFT HEART CATHETERIZATION WITH CORONARY ANGIOGRAM;  Surgeon: Fulton Reek, MD;  Location: [facility] CATH LAB;  Service: Cardiovascular;  Laterality: N/A;  . LEFT HEART CATHETERIZATION WITH CORONARY ANGIOGRAM N/A 06/28/2013   Procedure: LEFT HEART CATHETERIZATION WITH CORONARY ANGIOGRAM;  Surgeon: Leonie Man, MD;  Location: [facility] CATH LAB;  Service: Cardiovascular;  Laterality: N/A;  . NM MYOVIEW LTD  January '13   Diaphragmatic attenuation versus inferior infarct. No ischemia. Normal EF normal wall motion.  Marland Kitchen NM MYOVIEW LTD  June 2014   no ischemia  . NM MYOVIEW LTD  10/2014   LEXISCAN: Normal EF, 61%, No ischemia or infarction.  Mild diaphragmatic attenuation  . PATCH ANGIOPLASTY Left 05/21/2016   Procedure: PATCH ANGIOPLASTY USING HEMASHIELD PLATINUM FINESSE 0.3in x 3 in;  Surgeon: Elam Dutch, MD;  Location: [place];  Service: Vascular;  Laterality: Left;  . PERCUTANEOUS CORONARY STENT INTERVENTION (PCI-S)  06/11/2011   Procedure: PERCUTANEOUS CORONARY STENT INTERVENTION (PCI-S);  Surgeon: Fulton Reek, MD;  Location: [facility] CATH LAB;  Service: Cardiovascular;;  . PERCUTANEOUS CORONARY STENT INTERVENTION (PCI-S) N/A 10/12/2014   Procedure: PERCUTANEOUS CORONARY STENT INTERVENTION (PCI-S);  Surgeon: Leonie Man, MD;  Location: [facility] CATH LAB;  Ostial-Prox SVG-RCA Xience Alpine DES 3.0 mm x 38 mm; (3.4  - 3.3 mm)  . RETINAL LASER PROCEDURE Bilateral    "more than once"   . TONSILLECTOMY  as child  . TRANSTHORACIC ECHOCARDIOGRAM  03/2017    EF 55-60%.  Normal wall thickness.   No regional wall motion normalities.  GR 2 DD.  Mild-moderate calcified aortic valve.  Mild RV systolic dysfunction.  Moderate RV dilation.  Moderately increased PA pressures.  Domingo Dimes ECHOCARDIOGRAM  April 2016   Mod focal basal & mild overall LVH. EF 55-60%. Mild Ao Sclerosis, Mild MR with MAC. Only minimally elevated RVP.    Family History  Problem Relation Age of Onset  . Lung cancer Sister   . Breast cancer Sister   . Breast cancer Mother   . Hyperlipidemia Father   . Hypertension Father   . Throat cancer Father   . Colon polyps Father   . Liver cancer Maternal Grandmother   . Stomach cancer Neg Hx     SOCIAL HISTORY: Social History   Socioeconomic History  . Marital status: Married    Spouse name: Not on file  . Number of children: Not on file  . Years of education: Not on file  . Highest education level: Not on file  Occupational History  . Occupation: attorney  Social Needs  . Financial resource strain: Not on file  . Food insecurity:    Worry:  Not on file    Inability: Not on file  . Transportation needs:    Medical: Not on file    Non-medical: Not on file  Tobacco Use  . Smoking status: Former Smoker    Packs/day: 2.00    Years: 27.00    Pack years: 54.00    Types: Cigarettes    Last attempt to quit: 11/23/1996    Years since quitting: 21.1  . Smokeless tobacco: Never Used  Substance and Sexual Activity  . Alcohol use: Yes    Comment: rarely  . Drug use: No  . Sexual activity: Not Currently  Lifestyle  . Physical activity:    Days per week: Not on file    Minutes per session: Not on file  . Stress: Not on file  Relationships  . Social connections:    Talks on phone: Not on file    Gets together: Not on file    Attends religious service: Not on file    Active member of club or organization: Not on file    Attends meetings of clubs or organizations: Not on file    Relationship status: Not on file  . Intimate partner violence:    Fear  of current or ex partner: Not on file    Emotionally abused: Not on file    Physically abused: Not on file    Forced sexual activity: Not on file  Other Topics Concern  . 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 He notably has not been exercising like he used to. He does walk back and [place] He has social alcoholic beverages but is trying to cut that back and does not smoke.       He tells me he is a sucker for chips but does not eat sweets because of diabetes.    Allergies  Allergen Reactions  . Atorvastatin Other (See Comments)    Leg pain  . Ibuprofen Hives  . Pravastatin     Leg pain  . Simvastatin Other (See Comments)    Leg pain    Current Outpatient Medications  Medication Sig Dispense Refill  . acetaminophen (TYLENOL) 325 MG tablet Take 650 mg by mouth daily as needed for mild pain. Reported on 01/03/2016    . amLODipine (NORVASC) 10 MG tablet TAKE 1 TABLET ONCE DAILY. 30 tablet 6  . aspirin EC 81 MG tablet Take 1 tablet (81 mg total) by mouth every other day. (Patient taking differently: Take by mouth. Take 70m once daily on Tues, Thurs, Sat, and Sun)    . B-D ULTRAFINE III SHORT PEN 31G X 8 MM MISC See admin instructions.  0  . carvedilol (COREG) 12.5 MG tablet Take 1 tablet (12.5 mg total) by mouth 2 (two) times daily. 180 tablet 3  . clopidogrel (PLAVIX) 75 MG tablet TAKE 1 TABLET DAILY WITH BREAKFAST. 90 tablet 2  . docusate sodium (STOOL SOFTENER) 100 MG capsule Take 100 mg by mouth as needed for mild constipation.    . Evolocumab (REPATHA SURECLICK) 1161MG/ML SOAJ Inject 140 mg into the skin every 14 (fourteen) days. 2 pen 12  . fenofibrate 160 MG tablet Take 1 tablet (160 mg total) by mouth daily. 90 tablet 3  . furosemide (LASIX) 40 MG tablet  Take 120 mg in the morning  and 80 mg in the evening  May take an extra 40 mg if needed for 3 lb increase 300 tablet 3  . HUMALOG KWIKPEN 200 UNIT/ML SOPN Inject 20 Units as directed as directed.  5  . hydrALAZINE (APRESOLINE) 100 MG tablet Take 1 tablet (100 mg total) by mouth 3 (three) times daily. 90 tablet 8  . isosorbide mononitrate (IMDUR) 60 MG 24 hr tablet Take 1 tablet (60 mg total) by mouth daily. 90 tablet 3  . NITROSTAT 0.4 MG SL tablet DISSOLVE 1 TABLET UNDER TONGUE AS NEEDED FOR CHEST PAIN,MAY REPEAT IN5 MINUTES FOR 2 DOSES. 25 tablet 1  . pantoprazole (PROTONIX) 40 MG tablet Take 1 tablet (40 mg total) by mouth daily. 90 tablet 3  . rosuvastatin (CRESTOR) 10 MG tablet TAKE 10 MG Tuesday, Thursday,SATURDAY,SUNDAY 90 tablet 3  . TOUJEO MAX SOLOSTAR 300 UNIT/ML SOPN Inject 70 Units as directed 2 (two) times daily.  1  . VASCEPA 1 G CAPS Take 2 g by mouth 2 (two) times daily.   4   Current Facility-Administered Medications  Medication Dose Route Frequency Provider Last Rate Last Dose  . 0.9 %  sodium chloride infusion   Intravenous Continuous Leonie Man, MD      . 0.9 %  sodium chloride infusion  500 mL Intravenous Continuous Irene Shipper, MD      . 0.9 %  sodium chloride infusion  500 mL Intravenous Continuous Irene Shipper, MD      . 0.9 %  sodium chloride infusion  500 mL Intravenous Continuous Irene Shipper, MD        ROS:   General:  No weight loss, Fever, chills  HEENT: No recent headaches, no nasal bleeding, no visual changes, no sore throat  Neurologic: No dizziness, blackouts, seizures. No recent symptoms of stroke or mini- stroke. No recent episodes of slurred speech, or temporary blindness.  Cardiac: No recent episodes of chest pain/pressure, no shortness of breath at rest.  No shortness of breath with exertion.  Denies history of atrial fibrillation or irregular heartbeat  Vascular: No history of rest pain in feet.  + history of claudication.  No history of non-healing ulcer, No history  of DVT   Pulmonary: No home oxygen, no productive cough, no hemoptysis,  No asthma or wheezing  Musculoskeletal:  _0  Arthritis, _1  Low back pain,  _2  Joint pain  Hematologic:No history of hypercoagulable state.  No history of easy bleeding.  No history of anemia  Gastrointestinal: No hematochezia or melena,  No gastroesophageal reflux, no trouble swallowing  Urinary: _3  chronic Kidney disease, _4  on HD - _5  MWF or _6  TTHS, _7  Burning with urination, _8  Frequent urination, _9  Difficulty urinating;   Skin: No rashes  Psychological: No history of anxiety,  No history of depression   Physical Examination  Vitals:   12/31/17 1101 12/31/17 1105  BP: 130/64 131/60  Pulse: (!) 56   Resp: 16   Temp: 97.8 F (36.6 C)   TempSrc: Oral   SpO2: 94%   Weight: 212 lb (96.2 kg)   Height: 5' 8.5" (1.74 m)     Body mass index is 31.77 kg/m.  General:  Alert and oriented, no acute distress HEENT: Normal Neck: No bruit or JVD Pulmonary: Clear to auscultation bilaterally Cardiac: Regular Rate and Rhythm without murmur Abdomen: Soft, non-tender, non-distended, no mass Skin: No rash, no ulcer  Extremity Pulses:  2+ radial, brachial, femoral, absent popliteal dorsalis pedis, posterior tibial pulses bilaterally Musculoskeletal: No deformity or edema  Neurologic: Upper and lower extremity motor 5/5 and symmetric  DATA:  Patient had bilateral ABIs performed today which were 0.5 on the right 0.3 on the left I reviewed and interpreted the study.  He also had a carotid duplex exam today which showed no recurrent stenosis on the left side and no stenosis on the right side.  He did have evidence of some mild left subclavian artery stenosis.  ASSESSMENT: Patient with debilitating lifestyle limiting claudication symptoms bilateral lower extremities.  At this point he wishes to proceed with arteriography possible intervention possible bypass.   PLAN: She is scheduled for abdominal  aortogram bilateral lower extremity runoff possible intervention February 05, 2018.  He will drink a large amount of water the day prior to protect his kidneys.  We will also do him as a second case to give him some extra IV fluid prior to the procedure.  He understands risk benefits possible complications including but not limited to bleeding infection possible renal injury possible contrast reaction.  He understands and agrees to proceed.  He understands that if he requires bypass operation this will color to a later date after cardiac risk stratification.   Ruta Hinds, MD [facility] Office: [phone] Pager: [phone]

## 2018-01-04 DIAGNOSIS — E1122 Type 2 diabetes mellitus with diabetic chronic kidney disease: Secondary | ICD-10-CM | POA: Diagnosis not present

## 2018-01-04 DIAGNOSIS — N183 Chronic kidney disease, stage 3 (moderate): Secondary | ICD-10-CM | POA: Diagnosis not present

## 2018-01-04 DIAGNOSIS — Z6831 Body mass index (BMI) 31.0-31.9, adult: Secondary | ICD-10-CM | POA: Diagnosis not present

## 2018-01-04 DIAGNOSIS — I739 Peripheral vascular disease, unspecified: Secondary | ICD-10-CM | POA: Diagnosis not present

## 2018-01-04 DIAGNOSIS — I2510 Atherosclerotic heart disease of native coronary artery without angina pectoris: Secondary | ICD-10-CM | POA: Diagnosis not present

## 2018-01-04 DIAGNOSIS — I129 Hypertensive chronic kidney disease with stage 1 through stage 4 chronic kidney disease, or unspecified chronic kidney disease: Secondary | ICD-10-CM | POA: Diagnosis not present

## 2018-01-15 ENCOUNTER — Other Ambulatory Visit: Payer: Self-pay | Admitting: Cardiology

## 2018-01-15 NOTE — Telephone Encounter (Signed)
Rx request sent to pharmacy.  

## 2018-01-19 ENCOUNTER — Telehealth: Payer: Self-pay | Admitting: Cardiology

## 2018-01-19 NOTE — Telephone Encounter (Signed)
New Message:       Pt c/o medication issue:  1. Name of Medication: Evolocumab (REPATHA SURECLICK) 544 MG/ML SOAJ  2. How are you currently taking this medication (dosage and times per day)? Inject 140 mg into the skin every 14 (fourteen) days.  3. Are you having a reaction (difficulty breathing--STAT)? No  4. What is your medication issue? Pt is calling and wondering if he can come in to the office and see if someone can show him how to inject this the right way. Pt states he did it wrong the first time he was supposed to take this medication.

## 2018-01-19 NOTE — Telephone Encounter (Signed)
LMOM for patient to call back.

## 2018-01-20 ENCOUNTER — Telehealth: Payer: Self-pay | Admitting: Cardiology

## 2018-01-20 NOTE — Telephone Encounter (Signed)
Spoke with patient, he will come in at 3 pm tomorrow to learn injection technique

## 2018-01-20 NOTE — Telephone Encounter (Signed)
New Message        Pt c/o medication issue:  1. Name of Medication: repatha  2. How are you currently taking this medication (dosage and times per day)? Once every two weeks  3. Are you having a reaction (difficulty breathing--STAT)? Yes  4. What is your medication issue? Patient needing help with the injectable Rx

## 2018-01-22 ENCOUNTER — Other Ambulatory Visit: Payer: Self-pay | Admitting: Cardiology

## 2018-01-22 ENCOUNTER — Other Ambulatory Visit: Payer: Self-pay | Admitting: Vascular Surgery

## 2018-01-26 DIAGNOSIS — R404 Transient alteration of awareness: Secondary | ICD-10-CM | POA: Diagnosis not present

## 2018-01-26 DIAGNOSIS — E161 Other hypoglycemia: Secondary | ICD-10-CM | POA: Diagnosis not present

## 2018-01-26 DIAGNOSIS — E162 Hypoglycemia, unspecified: Secondary | ICD-10-CM | POA: Diagnosis not present

## 2018-01-26 DIAGNOSIS — R0902 Hypoxemia: Secondary | ICD-10-CM | POA: Diagnosis not present

## 2018-01-26 DIAGNOSIS — I10 Essential (primary) hypertension: Secondary | ICD-10-CM | POA: Diagnosis not present

## 2018-02-01 ENCOUNTER — Other Ambulatory Visit: Payer: Self-pay | Admitting: Cardiology

## 2018-02-03 DIAGNOSIS — L570 Actinic keratosis: Secondary | ICD-10-CM | POA: Diagnosis not present

## 2018-02-03 DIAGNOSIS — X32XXXD Exposure to sunlight, subsequent encounter: Secondary | ICD-10-CM | POA: Diagnosis not present

## 2018-02-05 ENCOUNTER — Ambulatory Visit (HOSPITAL_COMMUNITY)
Admission: RE | Admit: 2018-02-05 | Discharge: 2018-02-05 | Disposition: A | Discharge status: Home / Self Care | Payer: Medicare Other | Source: Ambulatory Visit | Attending: Vascular Surgery | Admitting: Vascular Surgery

## 2018-02-05 ENCOUNTER — Other Ambulatory Visit: Payer: Self-pay

## 2018-02-05 ENCOUNTER — Encounter (HOSPITAL_COMMUNITY)
Admission: RE | Disposition: A | Discharge status: Home / Self Care | Payer: Self-pay | Source: Ambulatory Visit | Attending: Vascular Surgery

## 2018-02-05 DIAGNOSIS — E1151 Type 2 diabetes mellitus with diabetic peripheral angiopathy without gangrene: Secondary | ICD-10-CM | POA: Diagnosis not present

## 2018-02-05 DIAGNOSIS — Z7902 Long term (current) use of antithrombotics/antiplatelets: Secondary | ICD-10-CM | POA: Diagnosis not present

## 2018-02-05 DIAGNOSIS — I2510 Atherosclerotic heart disease of native coronary artery without angina pectoris: Secondary | ICD-10-CM | POA: Diagnosis not present

## 2018-02-05 DIAGNOSIS — N183 Chronic kidney disease, stage 3 (moderate): Secondary | ICD-10-CM | POA: Insufficient documentation

## 2018-02-05 DIAGNOSIS — I509 Heart failure, unspecified: Secondary | ICD-10-CM | POA: Diagnosis not present

## 2018-02-05 DIAGNOSIS — Z87891 Personal history of nicotine dependence: Secondary | ICD-10-CM | POA: Diagnosis not present

## 2018-02-05 DIAGNOSIS — I70213 Atherosclerosis of native arteries of extremities with intermittent claudication, bilateral legs: Secondary | ICD-10-CM | POA: Diagnosis not present

## 2018-02-05 DIAGNOSIS — E7800 Pure hypercholesterolemia, unspecified: Secondary | ICD-10-CM | POA: Insufficient documentation

## 2018-02-05 DIAGNOSIS — Z7982 Long term (current) use of aspirin: Secondary | ICD-10-CM | POA: Diagnosis not present

## 2018-02-05 DIAGNOSIS — K219 Gastro-esophageal reflux disease without esophagitis: Secondary | ICD-10-CM | POA: Insufficient documentation

## 2018-02-05 DIAGNOSIS — E1122 Type 2 diabetes mellitus with diabetic chronic kidney disease: Secondary | ICD-10-CM | POA: Diagnosis not present

## 2018-02-05 DIAGNOSIS — I252 Old myocardial infarction: Secondary | ICD-10-CM | POA: Insufficient documentation

## 2018-02-05 DIAGNOSIS — Z951 Presence of aortocoronary bypass graft: Secondary | ICD-10-CM | POA: Diagnosis not present

## 2018-02-05 DIAGNOSIS — Z955 Presence of coronary angioplasty implant and graft: Secondary | ICD-10-CM | POA: Diagnosis not present

## 2018-02-05 DIAGNOSIS — Z794 Long term (current) use of insulin: Secondary | ICD-10-CM | POA: Diagnosis not present

## 2018-02-05 DIAGNOSIS — E11319 Type 2 diabetes mellitus with unspecified diabetic retinopathy without macular edema: Secondary | ICD-10-CM | POA: Diagnosis not present

## 2018-02-05 DIAGNOSIS — I130 Hypertensive heart and chronic kidney disease with heart failure and stage 1 through stage 4 chronic kidney disease, or unspecified chronic kidney disease: Secondary | ICD-10-CM | POA: Insufficient documentation

## 2018-02-05 HISTORY — PX: ABDOMINAL AORTOGRAM W/LOWER EXTREMITY: CATH118223

## 2018-02-05 LAB — POCT I-STAT, CHEM 8
BUN: 31 mg/dL — AB (ref 8–23)
Calcium, Ion: 1.17 mmol/L (ref 1.15–1.40)
Chloride: 94 mmol/L — ABNORMAL LOW (ref 98–111)
Creatinine, Ser: 2.4 mg/dL — ABNORMAL HIGH (ref 0.61–1.24)
Glucose, Bld: 173 mg/dL — ABNORMAL HIGH (ref 70–99)
HCT: 33 % — ABNORMAL LOW (ref 39.0–52.0)
Hemoglobin: 11.2 g/dL — ABNORMAL LOW (ref 13.0–17.0)
POTASSIUM: 3.9 mmol/L (ref 3.5–5.1)
Sodium: 133 mmol/L — ABNORMAL LOW (ref 135–145)
TCO2: 28 mmol/L (ref 22–32)

## 2018-02-05 LAB — GLUCOSE, CAPILLARY
GLUCOSE-CAPILLARY: 172 mg/dL — AB (ref 70–99)
Glucose-Capillary: 135 mg/dL — ABNORMAL HIGH (ref 70–99)

## 2018-02-05 SURGERY — ABDOMINAL AORTOGRAM W/LOWER EXTREMITY
Anesthesia: LOCAL

## 2018-02-05 MED ORDER — SODIUM CHLORIDE 0.9 % IV SOLN: INTRAVENOUS | Status: DC

## 2018-02-05 MED ORDER — MIDAZOLAM HCL 2 MG/2ML IJ SOLN
INTRAMUSCULAR | Status: DC | PRN
  Administered 2018-02-05: 2 mg via INTRAVENOUS

## 2018-02-05 MED ORDER — HEPARIN (PORCINE) IN NACL 1000-0.9 UT/500ML-% IV SOLN
INTRAVENOUS | Status: DC | PRN
  Administered 2018-02-05 (×2): 500 mL

## 2018-02-05 MED ORDER — IODIXANOL 320 MG/ML IV SOLN
INTRAVENOUS | Status: DC | PRN
  Administered 2018-02-05: 130 mL via INTRA_ARTERIAL

## 2018-02-05 MED ORDER — FENTANYL CITRATE (PF) 100 MCG/2ML IJ SOLN
INTRAMUSCULAR | Status: DC | PRN
  Administered 2018-02-05: 50 ug via INTRAVENOUS

## 2018-02-05 MED ORDER — ACETAMINOPHEN 325 MG PO TABS: 650.0000 mg | ORAL_TABLET | ORAL | Status: DC | PRN

## 2018-02-05 MED ORDER — FENTANYL CITRATE (PF) 100 MCG/2ML IJ SOLN
INTRAMUSCULAR | Status: AC
  Filled 2018-02-05: qty 2

## 2018-02-05 MED ORDER — HYDRALAZINE HCL 20 MG/ML IJ SOLN: 5.0000 mg | INTRAMUSCULAR | Status: DC | PRN

## 2018-02-05 MED ORDER — ONDANSETRON HCL 4 MG/2ML IJ SOLN
4.0000 mg | Freq: Four times a day (QID) | INTRAMUSCULAR | Status: DC | PRN
Start: 2018-02-05 — End: 2018-02-05

## 2018-02-05 MED ORDER — MIDAZOLAM HCL 2 MG/2ML IJ SOLN
INTRAMUSCULAR | Status: AC
  Filled 2018-02-05: qty 2

## 2018-02-05 MED ORDER — LIDOCAINE HCL (PF) 1 % IJ SOLN
INTRAMUSCULAR | Status: DC | PRN
  Administered 2018-02-05: 20 mL via INTRADERMAL

## 2018-02-05 MED ORDER — SODIUM CHLORIDE 0.9% FLUSH: 3.0000 mL | Freq: Two times a day (BID) | INTRAVENOUS | Status: DC

## 2018-02-05 MED ORDER — OXYCODONE HCL 5 MG PO TABS: 5.0000 mg | ORAL_TABLET | ORAL | Status: DC | PRN

## 2018-02-05 MED ORDER — LABETALOL HCL 5 MG/ML IV SOLN: 10.0000 mg | INTRAVENOUS | Status: DC | PRN

## 2018-02-05 MED ORDER — SODIUM CHLORIDE 0.9 % IV SOLN: 250.0000 mL | INTRAVENOUS | Status: DC | PRN

## 2018-02-05 MED ORDER — MORPHINE SULFATE (PF) 10 MG/ML IV SOLN: 2.0000 mg | INTRAVENOUS | Status: DC | PRN

## 2018-02-05 MED ORDER — SODIUM CHLORIDE 0.9% FLUSH: 3.0000 mL | INTRAVENOUS | Status: DC | PRN

## 2018-02-05 MED ORDER — LIDOCAINE HCL (PF) 1 % IJ SOLN
INTRAMUSCULAR | Status: AC
  Filled 2018-02-05: qty 30

## 2018-02-05 MED ORDER — SODIUM CHLORIDE 0.9 % IV SOLN
INTRAVENOUS | Status: DC
  Administered 2018-02-05: 07:00:00 via INTRAVENOUS

## 2018-02-05 MED ORDER — HEPARIN (PORCINE) IN NACL 1000-0.9 UT/500ML-% IV SOLN
INTRAVENOUS | Status: AC
  Filled 2018-02-05: qty 1000

## 2018-02-05 SURGICAL SUPPLY — 20 items
CATH ANGIO 5F PIGTAIL 65CM (CATHETERS) ×2 IMPLANT
CATH CROSS OVER TEMPO 5F (CATHETERS) ×2 IMPLANT
CATH NAVICROSS ANG 65CM (CATHETERS) ×1 IMPLANT
CATH SOFT-VU 4F 65 STRAIGHT (CATHETERS) ×1 IMPLANT
CATH SOFT-VU STRAIGHT 4F 65CM (CATHETERS) ×1
CATH STRAIGHT 5FR 65CM (CATHETERS) ×2 IMPLANT
CATH TEMPO 5F RIM 65CM (CATHETERS) ×2 IMPLANT
CATHETER NAVICROSS ANG 65CM (CATHETERS) ×2
DEVICE TORQUE .025-.038 (MISCELLANEOUS) ×2 IMPLANT
FILTER CO2 0.2 MICRON (VASCULAR PRODUCTS) ×2 IMPLANT
GUIDEWIRE ANGLED .035X150CM (WIRE) ×2 IMPLANT
KIT PV (KITS) ×2 IMPLANT
RESERVOIR CO2 (VASCULAR PRODUCTS) ×2 IMPLANT
SET FLUSH CO2 (MISCELLANEOUS) ×2 IMPLANT
SHEATH PINNACLE 5F 10CM (SHEATH) ×2 IMPLANT
SHEATH PROBE COVER 6X72 (BAG) ×2 IMPLANT
SYR MEDRAD MARK V 150ML (SYRINGE) ×2 IMPLANT
TRANSDUCER W/STOPCOCK (MISCELLANEOUS) ×2 IMPLANT
TRAY PV CATH (CUSTOM PROCEDURE TRAY) ×2 IMPLANT
WIRE BENTSON .035X145CM (WIRE) ×2 IMPLANT

## 2018-02-05 NOTE — H&P (View-Only) (Signed)
HPI:  Lance Collier is a 72 y.o. male who returns today for further follow-up regarding carotid and peripheral arterial occlusive disease. He underwent left carotid endarterectomy 2017. He continues to deny any new symptoms of TIA amaurosis or stroke. He still has debilitating lower extremity claudication walking minimal distance. He denies rest pain. He has been concerned about an intervention in the past secondary to his renal dysfunction. He is recently seen his nephrologist who thought that he would be at low risk overall for being on dialysis if he developed contrast nephropathy. Other medical problems include hypertension and diabetes both of which are currently controlled. He is on aspirin Plavix and a statin.  Review of systems: He denies shortness of breath. He denies chest pain.      Past Medical History:  Diagnosis Date  . Anemia   . Aortic sclerosis - without Stenosis March 2014   Echo: Aortic sclerosis without stenosis. EF 55-60% with normal WM; mild concentric hypertrophy with normal relaxation.  Marland Kitchen CAD (coronary artery disease), autologous vein bypass graft 09/2014   10/03/14: Cath for Class III-IV Angina -- 80% ostial-proximal SVG-RPDA (minimal flow down native PDA from native RCA with 50% proximal and distal, patent LIMA-LAD, SVG-OM with retrograde filling of OM 2. --> 10/12/14: Staged PCI of SVG-RPDA - Xience DES 3.0 mm x 38 mm; (3.4 - 3.3 mm)  . CAD S/P percutaneous coronary angioplasty 06/12/2011; 09/2104   a) 2012: Complex PCI mRCA ([place]) -- 2 overlapping Promus element DES stents.; 09/2014: ost-prox SVG-rPDA - Xience DES 3.0 mm x 38 mm; (3.4 - 3.3 mm)  . CHF (congestive heart failure) ([place])   . Childhood asthma   . CKD (chronic kidney disease), stage III ([place])    "mild; related to diabetes" (10/02/2014)  . Complication of anesthesia    "I've had name recall recognition problems since my last anesthesia"  . DM (diabetes mellitus) type II uncontrolled with eye manifestation  ([place]) 2013   Diabetic retinopathy with retinal hemorrhages since;, recurrent in August 2014 treated with Avastin shots  . Dyspnea    mainly with exertion  . GERD (gastroesophageal reflux disease)   . Heart murmur    questionable  . High cholesterol   . Hypertension   . Migraine ~ 2004   "once"  . PAD (peripheral artery disease) ([facility])    with claudication  . PONV (postoperative nausea and vomiting)    s/p tonsillectomy  . 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 07/01/2013 (Cath for [place] Unstable Angina) --> [name]: For LM disease; LIMA-LAD, SVG-RCA, SCG-Cx; b. 09/2014 Cath/Staged PCI: patent LIMA->LAD, VG->OM1/RI, VG->RCA 80% (3.0x38 Xience Alpine DES on 10/12/2014).  . 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 Likely thromboembolic event with PDA occlusion. 06/12/2011   Post Complex PCI, pt states no mi        Past Surgical History:  Procedure Laterality Date  . CARDIAC CATHETERIZATION  06/28/2013   ostial LM disese, RCA ~40-50%ISR  . CAROTID ENDARTERECTOMY Left 05/21/2016  . COLONOSCOPY    . CORONARY ANGIOPLASTY WITH STENT PLACEMENT Right September 2012   2 overlapping Promus DES 2.7 mm at 12 mm x2; postdilated to 3 mm  . CORONARY ARTERY BYPASS GRAFT N/A 07/01/2013   Procedure: CORONARY ARTERY BYPASS GRAFTING (CABG); Surgeon: Grace Isaac, MD; Location: [facility]; Service: Open Heart Surgery; Laterality: N/A; Times 3 using left internal mammary artery and endoscopically harvested bilateral saphenous vein  . ENDARTERECTOMY Left 05/21/2016  Procedure: LEFT CAROTID ENDARTERECTOMY; Surgeon: Elam Dutch, MD; Location: [facility]; Service: Vascular; Laterality: Left;  . EYE SURGERY Bilateral    ioc for cataracts  . INTRAOPERATIVE TRANSESOPHAGEAL ECHOCARDIOGRAM N/A 07/01/2013   Procedure: INTRAOPERATIVE TRANSESOPHAGEAL ECHOCARDIOGRAM; Surgeon: Grace Isaac, MD; Location: [facility]; Service: Open Heart Surgery; Laterality: N/A;  . LEFT AND RIGHT  HEART CATHETERIZATION WITH CORONARY/GRAFT ANGIOGRAM N/A 10/03/2014   Procedure: LEFT AND RIGHT HEART CATHETERIZATION WITH Beatrix Fetters; Surgeon: Leonie Man, MD; Location[facility]; Service: Cardiovascular; Laterality: N/A;  . LEFT HEART CATHETERIZATION WITH CORONARY ANGIOGRAM N/A 06/11/2011   Procedure: LEFT HEART CATHETERIZATION WITH CORONARY ANGIOGRAM; Surgeon: Fulton Reek, MD; Location: [facility] CATH LAB; Service: Cardiovascular; Laterality: N/A;  . LEFT HEART CATHETERIZATION WITH CORONARY ANGIOGRAM N/A 06/28/2013   Procedure: LEFT HEART CATHETERIZATION WITH CORONARY ANGIOGRAM; Surgeon: Leonie Man, MD; Location: [facility] CATH LAB; Service: Cardiovascular; Laterality: N/A;  . NM MYOVIEW LTD  January '13   Diaphragmatic attenuation versus inferior infarct. No ischemia. Normal EF normal wall motion.  Marland Kitchen NM MYOVIEW LTD  June 2014   no ischemia  . NM MYOVIEW LTD  10/2014   LEXISCAN: Normal EF, 61%, No ischemia or infarction. Mild diaphragmatic attenuation  . PATCH ANGIOPLASTY Left 05/21/2016   Procedure: PATCH ANGIOPLASTY USING HEMASHIELD PLATINUM FINESSE 0.3in x 3 in; Surgeon: Elam Dutch, MD; Location: [place]; Service: Vascular; Laterality: Left;  . PERCUTANEOUS CORONARY STENT INTERVENTION (PCI-S)  06/11/2011   Procedure: PERCUTANEOUS CORONARY STENT INTERVENTION (PCI-S); Surgeon: Fulton Reek, MD; Location: [facility] CATH LAB; Service: Cardiovascular;;  . PERCUTANEOUS CORONARY STENT INTERVENTION (PCI-S) N/A 10/12/2014   Procedure: PERCUTANEOUS CORONARY STENT INTERVENTION (PCI-S); Surgeon: Leonie Man, MD; Location: [facility] CATH LAB; Ostial-Prox SVG-RCA Xience Alpine DES 3.0 mm x 38 mm; (3.4 - 3.3 mm)  . RETINAL LASER PROCEDURE Bilateral    "more than once"   . TONSILLECTOMY  as child  . TRANSTHORACIC ECHOCARDIOGRAM  03/2017   EF 55-60%. Normal wall thickness. No regional wall motion normalities. GR 2 DD. Mild-moderate calcified aortic valve. Mild RV systolic dysfunction. Moderate RV  dilation. Moderately increased PA pressures.  Domingo Dimes ECHOCARDIOGRAM  April 2016   Mod focal basal & mild overall LVH. EF 55-60%. Mild Ao Sclerosis, Mild MR with MAC. Only minimally elevated RVP.        Family History  Problem Relation Age of Onset  . Lung cancer Sister   . Breast cancer Sister   . Breast cancer Mother   . Hyperlipidemia Father   . Hypertension Father   . Throat cancer Father   . Colon polyps Father   . Liver cancer Maternal Grandmother   . Stomach cancer Neg Hx    SOCIAL HISTORY:  Social History        Socioeconomic History  . Marital status: Married    Spouse name: Not on file  . Number of children: Not on file  . Years of education: Not on file  . Highest education level: Not on file  Occupational History  . Occupation: attorney  Social Needs  . Financial resource strain: Not on file  . Food insecurity:    Worry: Not on file    Inability: Not on file  . Transportation needs:    Medical: Not on file    Non-medical: Not on file  Tobacco Use  . Smoking status: Former Smoker    Packs/day: 2.00    Years: 27.00    Pack years: 54.00    Types: Cigarettes  Last attempt to quit: 11/23/1996    Years since quitting: 21.1  . Smokeless tobacco: Never Used  Substance and Sexual Activity  . Alcohol use: Yes    Comment: rarely  . Drug use: No  . Sexual activity: Not Currently  Lifestyle  . Physical activity:    Days per week: Not on file    Minutes per session: Not on file  . Stress: Not on file  Relationships  . Social connections:    Talks on phone: Not on file    Gets together: Not on file    Attends religious service: Not on file    Active member of club or organization: Not on file    Attends meetings of clubs or organizations: Not on file    Relationship status: Not on file  . Intimate partner violence:    Fear of current or ex partner: Not on file    Emotionally abused: Not on file    Physically abused: Not on file    Forced sexual  activity: Not on file  Other Topics Concern  . 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 He notably has not been exercising like he used to. He does walk [place] and [place] He has social alcoholic beverages but is trying to cut that back and does not smoke.      He tells me he is a sucker for chips but does not eat sweets because of diabetes.        Allergies  Allergen Reactions  . Atorvastatin Other (See Comments)    Leg pain  . Ibuprofen Hives  . Pravastatin     Leg pain  . Simvastatin Other (See Comments)    Leg pain         Current Outpatient Medications  Medication Sig Dispense Refill  . acetaminophen (TYLENOL) 325 MG tablet Take 650 mg by mouth daily as needed for mild pain. Reported on 01/03/2016    . amLODipine (NORVASC) 10 MG tablet TAKE 1 TABLET ONCE DAILY. 30 tablet 6  . aspirin EC 81 MG tablet Take 1 tablet (81 mg total) by mouth every other day. (Patient taking differently: Take by mouth. Take 18m once daily on Tues, Thurs, Sat, and Sun)    . B-D ULTRAFINE III SHORT PEN 31G X 8 MM MISC See admin instructions.  0  . carvedilol (COREG) 12.5 MG tablet Take 1 tablet (12.5 mg total) by mouth 2 (two) times daily. 180 tablet 3  . clopidogrel (PLAVIX) 75 MG tablet TAKE 1 TABLET DAILY WITH BREAKFAST. 90 tablet 2  . docusate sodium (STOOL SOFTENER) 100 MG capsule Take 100 mg by mouth as needed for mild constipation.    . Evolocumab (REPATHA SURECLICK) 1818MG/ML SOAJ Inject 140 mg into the skin every 14 (fourteen) days. 2 pen 12  . fenofibrate 160 MG tablet Take 1 tablet (160 mg total) by mouth daily. 90 tablet 3  . furosemide (LASIX) 40 MG tablet Take 120 mg in the morning and 80 mg in the evening May take an extra 40 mg if needed for 3 lb increase 300 tablet 3  . HUMALOG KWIKPEN 200 UNIT/ML  SOPN Inject 20 Units as directed as directed.  5  . hydrALAZINE (APRESOLINE) 100 MG tablet Take 1 tablet (  100 mg total) by mouth 3 (three) times daily. 90 tablet 8  . isosorbide mononitrate (IMDUR) 60 MG 24 hr tablet Take 1 tablet (60 mg total) by mouth daily. 90 tablet 3  . NITROSTAT 0.4 MG SL tablet DISSOLVE 1 TABLET UNDER TONGUE AS NEEDED FOR CHEST PAIN,MAY REPEAT IN5 MINUTES FOR 2 DOSES. 25 tablet 1  . pantoprazole (PROTONIX) 40 MG tablet Take 1 tablet (40 mg total) by mouth daily. 90 tablet 3  . rosuvastatin (CRESTOR) 10 MG tablet TAKE 10 MG Tuesday, Thursday,SATURDAY,SUNDAY 90 tablet 3  . TOUJEO MAX SOLOSTAR 300 UNIT/ML SOPN Inject 70 Units as directed 2 (two) times daily.  1  . VASCEPA 1 G CAPS Take 2 g by mouth 2 (two) times daily.   4            Current Facility-Administered Medications  Medication Dose Route Frequency Provider Last Rate Last Dose  . 0.9 % sodium chloride infusion  Intravenous Continuous Leonie Man, MD    . 0.9 % sodium chloride infusion 500 mL Intravenous Continuous Irene Shipper, MD    . 0.9 % sodium chloride infusion 500 mL Intravenous Continuous Irene Shipper, MD    . 0.9 % sodium chloride infusion 500 mL Intravenous Continuous Irene Shipper, MD     ROS:  General: No weight loss, Fever, chills  HEENT: No recent headaches, no nasal bleeding, no visual changes, no sore throat  Neurologic: No dizziness, blackouts, seizures. No recent symptoms of stroke or mini- stroke. No recent episodes of slurred speech, or temporary blindness.  Cardiac: No recent episodes of chest pain/pressure, no shortness of breath at rest. No shortness of breath with exertion. Denies history of atrial fibrillation or irregular heartbeat  Vascular: No history of rest pain in feet. + history of claudication. No history of non-healing ulcer, No history of DVT  Pulmonary: No home oxygen, no productive cough, no hemoptysis, No asthma or wheezing  Musculoskeletal: _0  Arthritis, _1  Low back  pain, _2  Joint pain  Hematologic:No history of hypercoagulable state. No history of easy bleeding. No history of anemia  Gastrointestinal: No hematochezia or melena, No gastroesophageal reflux, no trouble swallowing  Urinary: _3  chronic Kidney disease, _4  on HD - _5  MWF or _6  TTHS, _7  Burning with urination, _8  Frequent urination, _9  Difficulty urinating;  Skin: No rashes  Psychological: No history of anxiety, No history of depression  Physical Examination   Vitals:   02/05/18 0546  BP: (!) 159/43  Pulse: (!) 49  Temp: 98.3 F (36.8 C)  TempSrc: Oral  SpO2: 95%  Weight: 95.3 kg  Height: _10  (1.727 m)   Body mass index is 31.77 kg/m.  General: Alert and oriented, no acute distress  HEENT: Normal  Neck: No bruit or JVD  Pulmonary: Clear to auscultation bilaterally  Cardiac: Regular Rate and Rhythm without murmur  Abdomen: Soft, non-tender, non-distended, no mass  Skin: No rash, no ulcer  Extremity Pulses: 2+ radial, brachial, femoral, absent popliteal dorsalis pedis, posterior tibial pulses bilaterally  Musculoskeletal: No deformity or edema  Neurologic: Upper and lower extremity motor 5/5 and symmetric  DATA:  Patient had bilateral ABIs performed today which were 0.5 on the right 0.3 on the left I reviewed and interpreted the study.  He also had a carotid duplex exam today which showed no recurrent stenosis on the left side and no stenosis on the right side. He did have evidence of some mild  left subclavian artery stenosis.   ASSESSMENT: Patient with debilitating lifestyle limiting claudication symptoms bilateral lower extremities. At this point he wishes to proceed with arteriography possible intervention possible bypass.   PLAN: He is scheduled for abdominal aortogram bilateral lower extremity runoff possible intervention February 05, 2018. He will drink a large amount of water the day prior to protect his kidneys. We will also do him as a second case to give him some  extra IV fluid prior to the procedure. He understands risk benefits possible complications including but not limited to bleeding infection possible renal injury possible contrast reaction. He understands and agrees to proceed. He understands that if he requires bypass operation this will be scheduled at a later date after cardiac risk stratification.   Ruta Hinds, MD  [facility]  Office: [phone]  Pager: [phone]

## 2018-02-05 NOTE — Progress Notes (Signed)
Site area: right groin fa sheath Site Prior to Removal:  Level 0 Pressure Applied For:  20 minutes Manual:   yes Patient Status During Pull:  yes Post Pull Site:  Level  0 Post Pull Instructions Given:  yes Post Pull Pulses Present:  Rt dp dopplered Dressing Applied:  Gauze and tegaderm Bedrest begins @  4695 Comments:

## 2018-02-05 NOTE — Discharge Instructions (Signed)

## 2018-02-05 NOTE — Op Note (Signed)
Procedure: Abdominal aortogram with bilateral lower extremity runoff  Preoperative diagnosis: Claudication  Postoperative diagnosis: Same  Anesthesia: Local with IV sedation  Operative findings: 1   80% stenosis left common iliac origin  2.  60% stenosis right common iliac origin  3.  Short segment occlusion left superficial femoral artery  4.  Subtotal occlusion distal left common femoral artery  5.  Diffuse multisegment greater than 80% stenosis right popliteal artery extending from the abductor hiatus to the origin of the tibial vessels  6.  Three-vessel runoff right foot  7.  Two-vessel runoff left foot peroneal anterior tibial  Operative details: After obtaining informed consent, the patient was taken the PV lab.  The patient was placed in supine position Angio table.  Both groins were prepped and draped in usual sterile fashion.  Local anesthesia was inflated over the right common femoral artery.  An introducer needle was used to cannulate the right common femoral artery using ultrasound guidance.  Real-time ultrasound was performed which confirmed that the right common femoral artery was patent and a permanent image was obtained and placed in the patient's record.  After cannulation of the right common femoral artery and 035 versacore wire was threaded up in the abdominal aorta under fluoroscopic guidance.  A 5 French sheath placed over the guidewire in the right common femoral artery.  A 5 French pigtail catheter was then advanced up in the abdominal aorta under fluoroscopic guidance.  An abdominal aortogram was first obtained using carbon dioxide gas for contrast.  However due to overlying bowel gas the views were severely obscured.  Therefore at this point we switched over to standard contrast material.  The left and right renal arteries are widely patent.  The infrarenal abdominal aorta is patent.  The left and right common iliac arteries are patent.  However there is a heavily  calcified origin stenosis of the left common iliac artery approaching 80%.  There is a similar stenosis in the right common iliac artery but is probably only about 60% again this is hazy and calcified and difficult to precisely determine.  The external iliac and internal iliac arteries are patent.  The internal iliac arteries are small.  At this point the pigtail catheter was swapped out over guidewire for a 5 [place] crossover catheter.  This was used to selectively catheterize the left common iliac artery and an 035 angled Glidewire advanced into the distal left external iliac artery.  The crossover catheter was then removed and exchanged for a 5 French straight catheter.  Left lower extremity arteriogram was then obtained through this catheter.  The left common femoral and profunda femoris arteries patent.  The left superficial femoral artery is patent proximally but occludes at the adductor hiatus over a segment of about 4 cm.  There is also a calcified exophytic plaque at the bifurcation of the left common femoral artery obscuring about 80% of the lumen.  There is two-vessel runoff to the left foot with a widely patent popliteal artery.  The peroneal and anterior tibial arteries are the runoff vessels to the left foot.  At this point a 5 [place] straight catheter was removed over guidewire and right lower extremity views were obtained through the sheath in the right common femoral artery.  The right common femoral profunda femoris arteries patent.  The right Physcial femoral artery is widely patent.  The right popliteal artery is diffusely diseased with multiple segments of heavily calcified exophytic stenosis approaching 80% with several areas of subtotal  occlusion.  This is diseased all the way down to the level of the origin of the tibial vessels.  There is three-vessel runoff to the right foot.  At this point the procedure was concluded.  The patient tolerated the procedure well and there were no  complications.  The 5 French sheath was thoroughly flushed with heparinized saline.  The patient was taken to the holding area with sheath in place.  Operative management:  The patient will be scheduled in the near future for a left femoral endarterectomy with left and right common iliac stenting with a VBX covered stent and a left superficial femoral artery stent.  This will be scheduled for operating room 16 Tuesday, September 3.  As far as the right leg is concerned this is complicated popliteal and tibial origin occlusive disease and is not going to be amenable to a percutaneous revascularization most likely.  I discussed with the patient that after we improve the flow to his left leg we could talk more about whether or not to do more to improve the right leg other than the right common iliac stent to see whether or not we would get more perfusion but this may be of higher risks and stable involved the distal popliteal and possibly tibial vessels.  Ruta Hinds, MD [facility] [place] Office: [phone] Pager: [phone]

## 2018-02-05 NOTE — H&P (Signed)
HPI:  Lance W Mancino is a 72 y.o. male who returns today for further follow-up regarding carotid and peripheral arterial occlusive disease. He underwent left carotid endarterectomy 2017. He continues to deny any new symptoms of TIA amaurosis or stroke. He still has debilitating lower extremity claudication walking minimal distance. He denies rest pain. He has been concerned about an intervention in the past secondary to his renal dysfunction. He is recently seen his nephrologist who thought that he would be at low risk overall for being on dialysis if he developed contrast nephropathy. Other medical problems include hypertension and diabetes both of which are currently controlled. He is on aspirin Plavix and a statin.  Review of systems: He denies shortness of breath. He denies chest pain.      Past Medical History:  Diagnosis Date  . Anemia   . Aortic sclerosis - without Stenosis March 2014   Echo: Aortic sclerosis without stenosis. EF 55-60% with normal WM; mild concentric hypertrophy with normal relaxation.  . CAD (coronary artery disease), autologous vein bypass graft 09/2014   10/03/14: Cath for Class III-IV Angina -- 80% ostial-proximal SVG-RPDA (minimal flow down native PDA from native RCA with 50% proximal and distal, patent LIMA-LAD, SVG-OM with retrograde filling of OM 2. --> 10/12/14: Staged PCI of SVG-RPDA - Xience DES 3.0 mm x 38 mm; (3.4 - 3.3 mm)  . CAD S/P percutaneous coronary angioplasty 06/12/2011; 09/2104   a) 2012: Complex PCI mRCA ([facility]) -- 2 overlapping Promus element DES stents.; 09/2014: ost-prox SVG-rPDA - Xience DES 3.0 mm x 38 mm; (3.4 - 3.3 mm)  . CHF (congestive heart failure) (HCC)   . Childhood asthma   . CKD (chronic kidney disease), stage III (HCC)    "mild; related to diabetes" (10/02/2014)  . Complication of anesthesia    "I've had name recall recognition problems since my last anesthesia"  . DM (diabetes mellitus) type II uncontrolled with eye manifestation  (HCC) 2013   Diabetic retinopathy with retinal hemorrhages since;, recurrent in August 2014 treated with Avastin shots  . Dyspnea    mainly with exertion  . GERD (gastroesophageal reflux disease)   . Heart murmur    questionable  . High cholesterol   . Hypertension   . Migraine ~ 2004   "once"  . PAD (peripheral artery disease) (HCC)    with claudication  . PONV (postoperative nausea and vomiting)    s/p tonsillectomy  . 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 07/01/2013 (Cath for Crescendo Unstable Angina) --> [name]: For LM disease; LIMA-LAD, SVG-RCA, SCG-Cx; b. 09/2014 Cath/Staged PCI: patent LIMA->LAD, VG->OM1/RI, VG->RCA 80% (3.0x38 Xience Alpine DES on 10/12/2014).  . 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 Likely thromboembolic event with PDA occlusion. 06/12/2011   Post Complex PCI, pt states no mi        Past Surgical History:  Procedure Laterality Date  . CARDIAC CATHETERIZATION  06/28/2013   ostial LM disese, RCA ~40-50%ISR  . CAROTID ENDARTERECTOMY Left 05/21/2016  . COLONOSCOPY    . CORONARY ANGIOPLASTY WITH STENT PLACEMENT Right September 2012   2 overlapping Promus DES 2.7 mm at 12 mm x2; postdilated to 3 mm  . CORONARY ARTERY BYPASS GRAFT N/A 07/01/2013   Procedure: CORONARY ARTERY BYPASS GRAFTING (CABG); Surgeon: Edward B [name], MD; Location: [facility] OR; Service: Open Heart Surgery; Laterality: N/A; Times 3 using left internal mammary artery and endoscopically harvested bilateral saphenous vein  . ENDARTERECTOMY Left 05/21/2016     Procedure: LEFT CAROTID ENDARTERECTOMY; Surgeon: Charles E Fields, MD; Location: [facility] OR; Service: Vascular; Laterality: Left;  . EYE SURGERY Bilateral    ioc for cataracts  . INTRAOPERATIVE TRANSESOPHAGEAL ECHOCARDIOGRAM N/A 07/01/2013   Procedure: INTRAOPERATIVE TRANSESOPHAGEAL ECHOCARDIOGRAM; Surgeon: Edward B [name], MD; Location: [facility] OR; Service: Open Heart Surgery; Laterality: N/A;  . LEFT AND RIGHT  HEART CATHETERIZATION WITH CORONARY/GRAFT ANGIOGRAM N/A 10/03/2014   Procedure: LEFT AND RIGHT HEART CATHETERIZATION WITH CORONARY/GRAFT ANGIOGRAM; Surgeon: David W Harding, MD; Location: [facility]ATH [facility]; Service: Cardiovascular; Laterality: N/A;  . LEFT HEART CATHETERIZATION WITH CORONARY ANGIOGRAM N/A 06/11/2011   Procedure: LEFT HEART CATHETERIZATION WITH CORONARY ANGIOGRAM; Surgeon: Alfred B Little, MD; Location: [facility]; Service: Cardiovascular; Laterality: N/A;  . LEFT HEART CATHETERIZATION WITH CORONARY ANGIOGRAM N/A 06/28/2013   Procedure: LEFT HEART CATHETERIZATION WITH CORONARY ANGIOGRAM; Surgeon: David W Harding, MD; Location: [facility]; Service: Cardiovascular; Laterality: N/A;  . NM MYOVIEW LTD  January '13   Diaphragmatic attenuation versus inferior infarct. No ischemia. Normal EF normal wall motion.  . NM MYOVIEW LTD  June 2014   no ischemia  . NM MYOVIEW LTD  10/2014   LEXISCAN: Normal EF, 61%, No ischemia or infarction. Mild diaphragmatic attenuation  . PATCH ANGIOPLASTY Left 05/21/2016   Procedure: PATCH ANGIOPLASTY USING HEMASHIELD PLATINUM FINESSE 0.3in x 3 in; Surgeon: Charles E Fields, MD; Location: [facility]; Service: Vascular; Laterality: Left;  . PERCUTANEOUS CORONARY STENT INTERVENTION (PCI-S)  06/11/2011   Procedure: PERCUTANEOUS CORONARY STENT INTERVENTION (PCI-S); Surgeon: Alfred B Little, MD; Location: [facility]; Service: Cardiovascular;;  . PERCUTANEOUS CORONARY STENT INTERVENTION (PCI-S) N/A 10/12/2014   Procedure: PERCUTANEOUS CORONARY STENT INTERVENTION (PCI-S); Surgeon: David W Harding, MD; Location: [facility]; Ostial-Prox SVG-RCA Xience Alpine DES 3.0 mm x 38 mm; (3.4 - 3.3 mm)  . RETINAL LASER PROCEDURE Bilateral    "more than once"   . TONSILLECTOMY  as child  . TRANSTHORACIC ECHOCARDIOGRAM  03/2017   EF 55-60%. Normal wall thickness. No regional wall motion normalities. GR 2 DD. Mild-moderate calcified aortic valve. Mild RV systolic dysfunction. Moderate RV  dilation. Moderately increased PA pressures.  . TRANSTHORACIC ECHOCARDIOGRAM  April 2016   Mod focal basal & mild overall LVH. EF 55-60%. Mild Ao Sclerosis, Mild MR with MAC. Only minimally elevated RVP.        Family History  Problem Relation Age of Onset  . Lung cancer Sister   . Breast cancer Sister   . Breast cancer Mother   . Hyperlipidemia Father   . Hypertension Father   . Throat cancer Father   . Colon polyps Father   . Liver cancer Maternal Grandmother   . Stomach cancer Neg Hx    SOCIAL HISTORY:  Social History        Socioeconomic History  . Marital status: Married    Spouse name: Not on file  . Number of children: Not on file  . Years of education: Not on file  . Highest education level: Not on file  Occupational History  . Occupation: attorney  Social Needs  . Financial resource strain: Not on file  . Food insecurity:    Worry: Not on file    Inability: Not on file  . Transportation needs:    Medical: Not on file    Non-medical: Not on file  Tobacco Use  . Smoking status: Former Smoker    Packs/day: 2.00    Years: 27.00    Pack years: 54.00    Types: Cigarettes      Last attempt to quit: 11/23/1996    Years since quitting: 21.1  . Smokeless tobacco: Never Used  Substance and Sexual Activity  . Alcohol use: Yes    Comment: rarely  . Drug use: No  . Sexual activity: Not Currently  Lifestyle  . Physical activity:    Days per week: Not on file    Minutes per session: Not on file  . Stress: Not on file  Relationships  . Social connections:    Talks on phone: Not on file    Gets together: Not on file    Attends religious service: Not on file    Active member of club or organization: Not on file    Attends meetings of clubs or organizations: Not on file    Relationship status: Not on file  . Intimate partner violence:    Fear of current or ex partner: Not on file    Emotionally abused: Not on file    Physically abused: Not on file    Forced sexual  activity: Not on file  Other Topics Concern  . 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 He notably has not been exercising like he used to. He does walk back and [place]. He has social alcoholic beverages but is trying to cut that back and does not smoke.      He tells me he is a sucker for chips but does not eat sweets because of diabetes.        Allergies  Allergen Reactions  . Atorvastatin Other (See Comments)    Leg pain  . Ibuprofen Hives  . Pravastatin     Leg pain  . Simvastatin Other (See Comments)    Leg pain         Current Outpatient Medications  Medication Sig Dispense Refill  . acetaminophen (TYLENOL) 325 MG tablet Take 650 mg by mouth daily as needed for mild pain. Reported on 01/03/2016    . amLODipine (NORVASC) 10 MG tablet TAKE 1 TABLET ONCE DAILY. 30 tablet 6  . aspirin EC 81 MG tablet Take 1 tablet (81 mg total) by mouth every other day. (Patient taking differently: Take by mouth. Take 81mg once daily on Tues, Thurs, Sat, and Sun)    . B-D ULTRAFINE III SHORT PEN 31G X 8 MM MISC See admin instructions.  0  . carvedilol (COREG) 12.5 MG tablet Take 1 tablet (12.5 mg total) by mouth 2 (two) times daily. 180 tablet 3  . clopidogrel (PLAVIX) 75 MG tablet TAKE 1 TABLET DAILY WITH BREAKFAST. 90 tablet 2  . docusate sodium (STOOL SOFTENER) 100 MG capsule Take 100 mg by mouth as needed for mild constipation.    . Evolocumab (REPATHA SURECLICK) 140 MG/ML SOAJ Inject 140 mg into the skin every 14 (fourteen) days. 2 pen 12  . fenofibrate 160 MG tablet Take 1 tablet (160 mg total) by mouth daily. 90 tablet 3  . furosemide (LASIX) 40 MG tablet Take 120 mg in the morning and 80 mg in the evening May take an extra 40 mg if needed for 3 lb increase 300 tablet 3  . HUMALOG KWIKPEN 200 UNIT/ML  SOPN Inject 20 Units as directed as directed.  5  . hydrALAZINE (APRESOLINE) 100 MG tablet Take 1 tablet (  100 mg total) by mouth 3 (three) times daily. 90 tablet 8  . isosorbide mononitrate (IMDUR) 60 MG 24 hr tablet Take 1 tablet (60 mg total) by mouth daily. 90 tablet 3  . NITROSTAT 0.4 MG SL tablet DISSOLVE 1 TABLET UNDER TONGUE AS NEEDED FOR CHEST PAIN,MAY REPEAT IN5 MINUTES FOR 2 DOSES. 25 tablet 1  . pantoprazole (PROTONIX) 40 MG tablet Take 1 tablet (40 mg total) by mouth daily. 90 tablet 3  . rosuvastatin (CRESTOR) 10 MG tablet TAKE 10 MG Tuesday, Thursday,SATURDAY,SUNDAY 90 tablet 3  . TOUJEO MAX SOLOSTAR 300 UNIT/ML SOPN Inject 70 Units as directed 2 (two) times daily.  1  . VASCEPA 1 G CAPS Take 2 g by mouth 2 (two) times daily.   4            Current Facility-Administered Medications  Medication Dose Route Frequency Provider Last Rate Last Dose  . 0.9 % sodium chloride infusion  Intravenous Continuous Harding, David W, MD    . 0.9 % sodium chloride infusion 500 mL Intravenous Continuous Perry, John N, MD    . 0.9 % sodium chloride infusion 500 mL Intravenous Continuous Perry, John N, MD    . 0.9 % sodium chloride infusion 500 mL Intravenous Continuous Perry, John N, MD     ROS:  General: No weight loss, Fever, chills  HEENT: No recent headaches, no nasal bleeding, no visual changes, no sore throat  Neurologic: No dizziness, blackouts, seizures. No recent symptoms of stroke or mini- stroke. No recent episodes of slurred speech, or temporary blindness.  Cardiac: No recent episodes of chest pain/pressure, no shortness of breath at rest. No shortness of breath with exertion. Denies history of atrial fibrillation or irregular heartbeat  Vascular: No history of rest pain in feet. + history of claudication. No history of non-healing ulcer, No history of DVT  Pulmonary: No home oxygen, no productive cough, no hemoptysis, No asthma or wheezing  Musculoskeletal: [ ] Arthritis, [ ] Low back  pain, [ ] Joint pain  Hematologic:No history of hypercoagulable state. No history of easy bleeding. No history of anemia  Gastrointestinal: No hematochezia or melena, No gastroesophageal reflux, no trouble swallowing  Urinary: [X] chronic Kidney disease, [ ] on HD - [ ] MWF or [ ] TTHS, [ ] Burning with urination, [ ] Frequent urination, [ ] Difficulty urinating;  Skin: No rashes  Psychological: No history of anxiety, No history of depression  Physical Examination   Vitals:   02/05/18 0546  BP: (!) 159/43  Pulse: (!) 49  Temp: 98.3 F (36.8 C)  TempSrc: Oral  SpO2: 95%  Weight: 95.3 kg  Height: 5' 8" (1.727 m)   Body mass index is 31.77 kg/m.  General: Alert and oriented, no acute distress  HEENT: Normal  Neck: No bruit or JVD  Pulmonary: Clear to auscultation bilaterally  Cardiac: Regular Rate and Rhythm without murmur  Abdomen: Soft, non-tender, non-distended, no mass  Skin: No rash, no ulcer  Extremity Pulses: 2+ radial, brachial, femoral, absent popliteal dorsalis pedis, posterior tibial pulses bilaterally  Musculoskeletal: No deformity or edema  Neurologic: Upper and lower extremity motor 5/5 and symmetric  DATA:  Patient had bilateral ABIs performed today which were 0.5 on the right 0.3 on the left I reviewed and interpreted the study.  He also had a carotid duplex exam today which showed no recurrent stenosis on the left side and no stenosis on the right side. He did have evidence of some mild   left subclavian artery stenosis.   ASSESSMENT: Patient with debilitating lifestyle limiting claudication symptoms bilateral lower extremities. At this point he wishes to proceed with arteriography possible intervention possible bypass.   PLAN: He is scheduled for abdominal aortogram bilateral lower extremity runoff possible intervention February 05, 2018. He will drink a large amount of water the day prior to protect his kidneys. We will also do him as a second case to give him some  extra IV fluid prior to the procedure. He understands risk benefits possible complications including but not limited to bleeding infection possible renal injury possible contrast reaction. He understands and agrees to proceed. He understands that if he requires bypass operation this will be scheduled at a later date after cardiac risk stratification.   Charles Fields, MD  [facility]   Office: [phone]  Pager: [phone]   

## 2018-02-08 ENCOUNTER — Telehealth: Payer: Self-pay | Admitting: *Deleted

## 2018-02-08 ENCOUNTER — Encounter (HOSPITAL_COMMUNITY): Payer: Self-pay | Admitting: Vascular Surgery

## 2018-02-08 NOTE — Telephone Encounter (Signed)
-----   Message from Willy Eddy, RN sent at 02/08/2018  5:14 PM EDT ----- Regarding: Medication  Clearance 02/23/18 I hope I have entered and requested this in a format that pleases all parties.  Request for Medication hold for surgery  Left Femoral Endarterectomy and Bilateral viabahn iliac stents Dr. Ruta Hinds.  General Anesthesia 02/23/2018 Need to hold Plavix for 5 days pre-op Requesting medication hold approval/clearance for surgery.   [facility] [phone] fax [phone] Gomez Cleverly RN

## 2018-02-09 ENCOUNTER — Other Ambulatory Visit: Payer: Self-pay | Admitting: *Deleted

## 2018-02-09 ENCOUNTER — Telehealth: Payer: Self-pay | Admitting: *Deleted

## 2018-02-09 NOTE — Progress Notes (Signed)
Call to patient ans instructed to be at [facility] hospital admitting department at 5:30 am on 02/23/18 for surgery. Continue aspirin/ hold Plavix for 5 days pre-op. NPO past MN night prior and expect a call/ pre-op appointment and follow the detailed instructions received from the hospital pre-admission department for this surgery. Verbalized understanding.

## 2018-02-09 NOTE — Telephone Encounter (Signed)
test

## 2018-02-09 NOTE — Telephone Encounter (Signed)
This is a duplicate request - I am removing.

## 2018-02-09 NOTE — Telephone Encounter (Signed)
Routing to Dr. Ellyn Hack  Dr. Ellyn Hack - please advise if the holding of Plavix for 5 days is ok.   Please route your response back to CV DIV PREOP pool.  Thank you.  Burtis Junes, RN, [facility] [address] [place],   [phone]

## 2018-02-11 NOTE — Telephone Encounter (Signed)
OK to hold Plavix x 5 d pre-op/  Re-start POD #1. Glenetta Hew, MD

## 2018-02-16 ENCOUNTER — Encounter (HOSPITAL_COMMUNITY)
Admission: RE | Admit: 2018-02-16 | Discharge: 2018-02-16 | Disposition: A | Discharge status: Home / Self Care | Payer: Medicare Other | Source: Ambulatory Visit | Attending: Vascular Surgery | Admitting: Vascular Surgery

## 2018-02-16 ENCOUNTER — Other Ambulatory Visit: Payer: Self-pay

## 2018-02-16 ENCOUNTER — Encounter (HOSPITAL_COMMUNITY): Payer: Self-pay

## 2018-02-16 DIAGNOSIS — K219 Gastro-esophageal reflux disease without esophagitis: Secondary | ICD-10-CM | POA: Insufficient documentation

## 2018-02-16 DIAGNOSIS — I2510 Atherosclerotic heart disease of native coronary artery without angina pectoris: Secondary | ICD-10-CM | POA: Insufficient documentation

## 2018-02-16 DIAGNOSIS — I252 Old myocardial infarction: Secondary | ICD-10-CM | POA: Diagnosis not present

## 2018-02-16 DIAGNOSIS — Z0183 Encounter for blood typing: Secondary | ICD-10-CM | POA: Diagnosis not present

## 2018-02-16 DIAGNOSIS — E7800 Pure hypercholesterolemia, unspecified: Secondary | ICD-10-CM | POA: Diagnosis not present

## 2018-02-16 DIAGNOSIS — Z01818 Encounter for other preprocedural examination: Secondary | ICD-10-CM | POA: Insufficient documentation

## 2018-02-16 DIAGNOSIS — N183 Chronic kidney disease, stage 3 (moderate): Secondary | ICD-10-CM | POA: Diagnosis not present

## 2018-02-16 DIAGNOSIS — Z7982 Long term (current) use of aspirin: Secondary | ICD-10-CM | POA: Diagnosis not present

## 2018-02-16 DIAGNOSIS — Z7902 Long term (current) use of antithrombotics/antiplatelets: Secondary | ICD-10-CM | POA: Insufficient documentation

## 2018-02-16 DIAGNOSIS — E1122 Type 2 diabetes mellitus with diabetic chronic kidney disease: Secondary | ICD-10-CM | POA: Insufficient documentation

## 2018-02-16 DIAGNOSIS — Z79899 Other long term (current) drug therapy: Secondary | ICD-10-CM | POA: Insufficient documentation

## 2018-02-16 DIAGNOSIS — Z01812 Encounter for preprocedural laboratory examination: Secondary | ICD-10-CM | POA: Insufficient documentation

## 2018-02-16 DIAGNOSIS — E11319 Type 2 diabetes mellitus with unspecified diabetic retinopathy without macular edema: Secondary | ICD-10-CM | POA: Insufficient documentation

## 2018-02-16 DIAGNOSIS — I130 Hypertensive heart and chronic kidney disease with heart failure and stage 1 through stage 4 chronic kidney disease, or unspecified chronic kidney disease: Secondary | ICD-10-CM | POA: Insufficient documentation

## 2018-02-16 DIAGNOSIS — Z794 Long term (current) use of insulin: Secondary | ICD-10-CM | POA: Diagnosis not present

## 2018-02-16 DIAGNOSIS — Z951 Presence of aortocoronary bypass graft: Secondary | ICD-10-CM | POA: Insufficient documentation

## 2018-02-16 DIAGNOSIS — Z955 Presence of coronary angioplasty implant and graft: Secondary | ICD-10-CM | POA: Diagnosis not present

## 2018-02-16 DIAGNOSIS — I509 Heart failure, unspecified: Secondary | ICD-10-CM | POA: Diagnosis not present

## 2018-02-16 DIAGNOSIS — E1151 Type 2 diabetes mellitus with diabetic peripheral angiopathy without gangrene: Secondary | ICD-10-CM | POA: Diagnosis not present

## 2018-02-16 HISTORY — DX: Unspecified osteoarthritis, unspecified site: M19.90

## 2018-02-16 HISTORY — DX: Presence of spectacles and contact lenses: Z97.3

## 2018-02-16 LAB — COMPREHENSIVE METABOLIC PANEL
ALBUMIN: 3.9 g/dL (ref 3.5–5.0)
ALT: 33 U/L (ref 0–44)
AST: 31 U/L (ref 15–41)
Alkaline Phosphatase: 53 U/L (ref 38–126)
Anion gap: 13 (ref 5–15)
BILIRUBIN TOTAL: 0.8 mg/dL (ref 0.3–1.2)
BUN: 34 mg/dL — AB (ref 8–23)
CO2: 27 mmol/L (ref 22–32)
CREATININE: 2.62 mg/dL — AB (ref 0.61–1.24)
Calcium: 9.5 mg/dL (ref 8.9–10.3)
Chloride: 99 mmol/L (ref 98–111)
GFR calc Af Amer: 26 mL/min — ABNORMAL LOW (ref >60)
GFR, EST NON AFRICAN AMERICAN: 23 mL/min — AB (ref >60)
GLUCOSE: 111 mg/dL — AB (ref 70–99)
POTASSIUM: 3.9 mmol/L (ref 3.5–5.1)
Sodium: 139 mmol/L (ref 135–145)
TOTAL PROTEIN: 6.8 g/dL (ref 6.5–8.1)

## 2018-02-16 LAB — URINALYSIS, ROUTINE W REFLEX MICROSCOPIC
Bacteria, UA: NONE SEEN
Bilirubin Urine: NEGATIVE
GLUCOSE, UA: NEGATIVE mg/dL
HGB URINE DIPSTICK: NEGATIVE
Ketones, ur: NEGATIVE mg/dL
LEUKOCYTES UA: NEGATIVE
NITRITE: NEGATIVE
Protein, ur: 30 mg/dL — AB
SPECIFIC GRAVITY, URINE: 1.01 (ref 1.005–1.030)
pH: 6 (ref 5.0–8.0)

## 2018-02-16 LAB — CBC
HEMATOCRIT: 35.7 % — AB (ref 39.0–52.0)
HEMOGLOBIN: 11.2 g/dL — AB (ref 13.0–17.0)
MCH: 28.7 pg (ref 26.0–34.0)
MCHC: 31.4 g/dL (ref 30.0–36.0)
MCV: 91.5 fL (ref 78.0–100.0)
Platelets: 270 10*3/uL (ref 150–400)
RBC: 3.9 MIL/uL — AB (ref 4.22–5.81)
RDW: 13.2 % (ref 11.5–15.5)
WBC: 6.2 10*3/uL (ref 4.0–10.5)

## 2018-02-16 LAB — SURGICAL PCR SCREEN
MRSA, PCR: NEGATIVE
Staphylococcus aureus: NEGATIVE

## 2018-02-16 LAB — HEMOGLOBIN A1C
Hgb A1c MFr Bld: 7.3 % — ABNORMAL HIGH (ref 4.8–5.6)
Mean Plasma Glucose: 162.81 mg/dL

## 2018-02-16 LAB — PROTIME-INR
INR: 0.99
Prothrombin Time: 13 seconds (ref 11.4–15.2)

## 2018-02-16 LAB — APTT: APTT: 31 s (ref 24–36)

## 2018-02-16 LAB — GLUCOSE, CAPILLARY: Glucose-Capillary: 167 mg/dL — ABNORMAL HIGH (ref 70–99)

## 2018-02-16 NOTE — Progress Notes (Signed)
Pt denies any acute cardiopulmonary issues. Pt stated that he is under the care of Dr. Glenetta Hew, Cardiology. Pt PCP is Dr. Forde Dandy at [facility]. Pt denies recent labs (confirmed with PCP's office- no A1c). Pt stated that he was instructed to stop Plavix on Thursday, 02/18/18. Pt denies having a chest x ray within the last year. Pt chart forwarded to anesthesia to review ( cardiac history and elevated creatinine 2.62 with hx of mild kidney disease ).

## 2018-02-16 NOTE — Pre-Procedure Instructions (Signed)
Lance Collier  02/16/2018     [place], [place] [address] Phone: [phone] Fax: [phone]   Your procedure is scheduled on Tuesday, February 23, 2018  Report to [facility] Admitting at 5:30 A.M.  Call this number if you have problems the morning of surgery:  [phone]   Remember: Follow surgeon's instructions regarding clopidogrel (PLAVIX).  Do not eat or drink after midnight Monday, February 22, 2018  Take these medicines the morning of surgery with A SIP OF WATER:  amLODipine (NORVASC), aspirin, carvedilol (COREG), fenofibrate, hydrALAZINE (APRESOLINE), isosorbide mononitrate (IMDUR), pantoprazole (PROTONIX) If needed: acetaminophen (TYLENOL) for pain Stop taking vitamins, Vascepa ( fish oil ) and herbal medications. Do not take any NSAIDs ie: Ibuprofen, Advil, Naproxen (Aleve), Motrin, BC and Goody Powder; stop now.    How to Manage Your Diabetes Before and After Surgery  Why is it important to control my blood sugar before and after surgery? . Improving blood sugar levels before and after surgery helps healing and can limit problems. . A way of improving blood sugar control is eating a healthy diet by: o  Eating less sugar and carbohydrates o  Increasing activity/exercise o  Talking with your doctor about reaching your blood sugar goals . High blood sugars (greater than 180 mg/dL) can raise your risk of infections and slow your recovery, so you will need to focus on controlling your diabetes during the weeks before surgery. . Make sure that the doctor who takes care of your diabetes knows about your planned surgery including the date and location.  How do I manage my blood sugar before surgery? . Check your blood sugar at least 4 times a day, starting 2 days before surgery, to make sure that the level is not too high or low. o Check your blood sugar the morning of  your surgery when you wake up and every 2 hours until you get to the Short Stay unit. . If your blood sugar is less than 70 mg/dL, you will need to treat for low blood sugar: o Do not take insulin. o Treat a low blood sugar (less than 70 mg/dL) with  cup of clear juice (cranberry or apple), 4 glucose tablets, OR glucose gel. Recheck blood sugar in 15 minutes after treatment (to make sure it is greater than 70 mg/dL). If your blood sugar is not greater than 70 mg/dL on recheck, call [phone] o  for further instructions. . Report your blood sugar to the short stay nurse when you get to Short Stay.  . If you are admitted to the hospital after surgery: o Your blood sugar will be checked by the staff and you will probably be given insulin after surgery (instead of oral diabetes medicines) to make sure you have good blood sugar levels. o The goal for blood sugar control after surgery is 80-180 mg/dL.  WHAT DO I DO ABOUT MY DIABETES MEDICATION?  THE NIGHT BEFORE SURGERY, take 35 units of TOUJEO insulin.  THE MORNING OF SURGERY, take 35 units of TOUJEO insulin if your blood glucose is greater than 70.  Reviewed and Endorsed by [facility] Patient Education Committee, August 2015  Do not wear jewelry, make-up or nail polish.  Do not wear lotions, powders, or perfumes, or deodorant.  Do not shave 48 hours prior to surgery.  Men may shave face and neck.  Do not bring valuables to the hospital.  [facility] is not responsible for any belongings or valuables.  Contacts, dentures or bridgework may not be worn into surgery.  Leave your suitcase in the car.  After surgery it may be brought to your room Patients discharged the day of surgery will not be allowed to drive home.  Special instructions: See " [facility]- Preparing For Surgery " sheet Please read over the following fact sheets that you were given. Pain Booklet, Coughing and Deep Breathing, MRSA Information and Surgical Site Infection  Prevention

## 2018-02-17 ENCOUNTER — Other Ambulatory Visit: Payer: Self-pay | Admitting: Cardiology

## 2018-02-17 NOTE — Progress Notes (Signed)
Anesthesia Chart Review:  Case:  [id number] Date/Time:  02/23/18 0715   Procedures:      LEFT FEMORAL ENDARTERECTOMY LEFT SUPERFICIAL FEMORAL  ARTERY STENT (Left )     INSERTION OF BILATERAL COMMON ILIAC STENTS WITH VIABAHN STENTS (Bilateral )   Anesthesia type:  General   Pre-op diagnosis:  PERIPHERAL ARTERY DISEASE   Location:  [facility] OR ROOM 105 / [place] [facility]   Surgeon:  Elam Dutch, MD      DISCUSSION: 72 yo male former smoker for above procedure. Pertinent hx includes PONV, HTN, GERD, DMII, DOE, CKD III, MI/CAD s/p PCI and CABG (2012: Complex PCI mRCA ([facility]) -- 2 overlapping Promus element DES stents.; 09/2014: ost-prox SVG-rPDA - Xience DES 3.0 mm x 38 mm; (3.4 - 3.3 mm)), CHF (EF 55-60% by TTE 2018 with grade 2 dd), PAD.   Pt was seen by cardiologist Dr. Ellyn Hack 11/23/2017 and at that time it was discussed that the pt was planning on proceeding with peripheral vascular intervention. Dr. Ellyn Hack stated that the pt was not having any anginal symptoms and did not recommend any additional testing at this time. Per telephone encounter 02/11/2018 Dr. Ellyn Hack advised Ok to hold Plavix 5d preop and restart POD #1.  Anticipate he can proceed with procedure as planned barring acute status change.  VS: BP (!) 128/46 Comment: checked manually; will notify Sherlynn Stalls, RN  Pulse (!) 52   Temp 36.5 C (Oral)   Resp 18   Ht _0  (1.727 m)   Wt 95.6 kg   SpO2 99%   BMI 32.05 kg/m   PROVIDERS: Reynold Bowen, MD is PCP  Glenetta Hew, MD is Cardiologist last seen 11/23/2017  LABS: Labs reviewed: Acceptable for surgery. Elevated creatinine in setting of CKD III appears to be near his baseline based on review of previous labs.  Creatinine, Ser (mg/dL)  Date Value  02/16/2018 2.62 (H)  02/05/2018 2.40 (H)  05/22/2016 1.99 (H)  05/19/2016 2.45 (H)    (all labs ordered are listed, but only abnormal results are displayed)  Labs Reviewed  CBC - Abnormal; Notable for the following components:       Result Value   RBC 3.90 (*)    Hemoglobin 11.2 (*)    HCT 35.7 (*)    All other components within normal limits  COMPREHENSIVE METABOLIC PANEL - Abnormal; Notable for the following components:   Glucose, Bld 111 (*)    BUN 34 (*)    Creatinine, Ser 2.62 (*)    GFR calc non Af Amer 23 (*)    GFR calc Af Amer 26 (*)    All other components within normal limits  URINALYSIS, ROUTINE W REFLEX MICROSCOPIC - Abnormal; Notable for the following components:   Protein, ur 30 (*)    All other components within normal limits  HEMOGLOBIN A1C - Abnormal; Notable for the following components:   Hgb A1c MFr Bld 7.3 (*)    All other components within normal limits  GLUCOSE, CAPILLARY - Abnormal; Notable for the following components:   Glucose-Capillary 167 (*)    All other components within normal limits  SURGICAL PCR SCREEN  APTT  PROTIME-INR  TYPE AND SCREEN     IMAGES: CHEST  2 VIEW 08/04/2013  COMPARISON:  DG CHEST 2 VIEW dated 07/05/2013; DG CHEST 1V PORT dated 07/03/2013  FINDINGS: Cardiothoracic index 49%, formerly 59%. Atherosclerotic aortic arch noted. The lungs appear clear. No pleural effusion. Mild thoracic spondylosis. Coronary stent noted.  IMPRESSION: 1. Improved  size of cardiopericardial silhouette, now within normal limits. No edema, pleural effusion, or acute findings.  EKG: 04/14/2017: Sinus rhythm with occas. PVC. LVH with repol abnormality. No significant change.  CV: Carotid US 12/31/2017: Final Interpretation: Right Carotid: Velocities in the right ICA are consistent with a 1-39% stenosis.  Left Carotid: Patent left carotid endarterectomy site with evidence of mild       hyperplasia at the distal patch and a 1-39% internal carotid       artery stenosis.  Vertebrals: Bilateral vertebral arteries demonstrate antegrade flow. Subclavians: Left subclavian artery was stenotic. Normal flow hemodynamics were       seen in the right  subclavian artery.  TTE 04/06/2017: Study Conclusions  - Left ventricle: The cavity size was normal. Wall thickness was   normal. Systolic function was normal. The estimated ejection   fraction was in the range of 55% to 60%. Wall motion was normal;   there were no regional wall motion abnormalities. Features are   consistent with a pseudonormal left ventricular filling pattern,   with concomitant abnormal relaxation and increased filling   pressure (grade 2 diastolic dysfunction). - Aortic valve: Mildly to moderately calcified annulus. - Mitral valve: There was mild regurgitation. - Left atrium: The atrium was mildly dilated. - Right ventricle: The cavity size was mildly dilated. Systolic   function was mildly reduced. - Right atrium: The atrium was moderately dilated. - Pulmonary arteries: Systolic pressure was moderately increased.   PA peak pressure: 50 mm Hg (S).  Lexiscan 05/14/2016:   Nuclear stress EF: 55%.  The left ventricular ejection fraction is normal (55-65%).  There was no ST segment deviation noted during stress.  The study is normal.  This is a low risk study   Past Medical History:  Diagnosis Date  . Anemia   . Aortic sclerosis  - without Stenosis  March 2014   Echo: Aortic sclerosis without stenosis. EF 55-60% with normal WM; mild concentric hypertrophy with normal relaxation.  . Arthritis   . CAD (coronary artery disease), autologous vein bypass graft 09/2014   10/03/14: Cath for Class III-IV Angina -- 80% ostial-proximal SVG-RPDA (minimal flow down native PDA from native RCA with 50% proximal and distal, patent LIMA-LAD, SVG-OM with retrograde filling of OM 2. --> 10/12/14: Staged PCI of SVG-RPDA - Xience DES  3.0 mm x 38 mm; (3.4  - 3.3 mm)  . CAD S/P percutaneous coronary angioplasty 06/12/2011; 09/2104   a) 2012: Complex PCI mRCA ([facility]) --  2 overlapping Promus element DES stents.; 09/2014:  ost-prox SVG-rPDA - Xience DES 3.0 mm x 38 mm; (3.4  -  3.3 mm)  . CHF (congestive heart failure) ([place])   . Childhood asthma   . CKD (chronic kidney disease), stage III ([place])    "mild; related to diabetes" (10/02/2014)  . Complication of anesthesia    "I've had name recall recognition problems since my last anesthesia"  . DM (diabetes mellitus) type II uncontrolled with eye manifestation ([place]) 2013   Diabetic retinopathy with retinal hemorrhages since;, recurrent in August 2014 treated with Avastin shots  . Dyspnea    mainly with exertion  . GERD (gastroesophageal reflux disease)   . Heart murmur    questionable  . High cholesterol   . Hypertension   . Migraine ~ 2004   "once"  . PAD (peripheral artery disease) ([facility])    with claudication  . PONV (postoperative nausea and vomiting)    s/p tonsillectomy  . S/P CABG x  3 07/01/2013   a. 07/01/2013 (Cath for [place] Unstable Angina) --> [name]: For LM disease; LIMA-LAD, SVG-RCA, SCG-Cx; b. 09/2014 Cath/Staged PCI: patent LIMA->LAD, VG->OM1/RI, VG->RCA 80% (3.0x38 Xience Alpine DES on 10/12/2014).  . 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  Likely thromboembolic event with PDA occlusion. 06/12/2011   Post Complex PCI, pt states no mi  . Wears glasses    reading    Past Surgical History:  Procedure Laterality Date  . ABDOMINAL AORTOGRAM W/LOWER EXTREMITY N/A 02/05/2018   Procedure: ABDOMINAL AORTOGRAM W/LOWER EXTREMITY;  Surgeon: Elam Dutch, MD;  Location: [facility];  Service: Cardiovascular;  Laterality: N/A;  . CARDIAC CATHETERIZATION  06/28/2013   ostial LM disese, RCA ~40-50%ISR  . CAROTID ENDARTERECTOMY Left 05/21/2016  . COLONOSCOPY    . CORONARY ANGIOPLASTY WITH STENT PLACEMENT Right September 2012   2 overlapping Promus DES 2.7 mm at 12 mm x2; postdilated to 3 mm  . CORONARY ARTERY BYPASS GRAFT N/A 07/01/2013   Procedure: CORONARY ARTERY BYPASS GRAFTING (CABG);  Surgeon: Grace Isaac, MD;  Location: [facility];   Service: Open Heart Surgery;  Laterality: N/A;  Times 3 using left internal mammary artery and endoscopically harvested bilateral saphenous vein  . ENDARTERECTOMY Left 05/21/2016   Procedure: LEFT CAROTID ENDARTERECTOMY;  Surgeon: Elam Dutch, MD;  Location: [facility];  Service: Vascular;  Laterality: Left;  . EYE SURGERY Bilateral    ioc for cataracts  . INTRAOPERATIVE TRANSESOPHAGEAL ECHOCARDIOGRAM N/A 07/01/2013   Procedure: INTRAOPERATIVE TRANSESOPHAGEAL ECHOCARDIOGRAM;  Surgeon: Grace Isaac, MD;  Location: [place] [facility];  Service: Open Heart Surgery;  Laterality: N/A;  . LEFT AND RIGHT HEART CATHETERIZATION WITH CORONARY/GRAFT ANGIOGRAM N/A 10/03/2014   Procedure: LEFT AND RIGHT HEART CATHETERIZATION WITH Beatrix Fetters;  Surgeon: Leonie Man, MD;  Location: [facility] CATH LAB;  Service: Cardiovascular;  Laterality: N/A;  . LEFT HEART CATHETERIZATION WITH CORONARY ANGIOGRAM N/A 06/11/2011   Procedure: LEFT HEART CATHETERIZATION WITH CORONARY ANGIOGRAM;  Surgeon: Fulton Reek, MD;  Location: [facility] CATH LAB;  Service: Cardiovascular;  Laterality: N/A;  . LEFT HEART CATHETERIZATION WITH CORONARY ANGIOGRAM N/A 06/28/2013   Procedure: LEFT HEART CATHETERIZATION WITH CORONARY ANGIOGRAM;  Surgeon: Leonie Man, MD;  Location: [facility] CATH LAB;  Service: Cardiovascular;  Laterality: N/A;  . NM MYOVIEW LTD  January '13   Diaphragmatic attenuation versus inferior infarct. No ischemia. Normal EF normal wall motion.  Marland Kitchen NM MYOVIEW LTD  June 2014   no ischemia  . NM MYOVIEW LTD  10/2014   LEXISCAN: Normal EF, 61%, No ischemia or infarction.  Mild diaphragmatic attenuation  . PATCH ANGIOPLASTY Left 05/21/2016   Procedure: PATCH ANGIOPLASTY USING HEMASHIELD PLATINUM FINESSE 0.3in x 3 in;  Surgeon: Elam Dutch, MD;  Location: [place];  Service: Vascular;  Laterality: Left;  . PERCUTANEOUS CORONARY STENT INTERVENTION (PCI-S)  06/11/2011   Procedure: PERCUTANEOUS CORONARY STENT INTERVENTION (PCI-S);   Surgeon: Fulton Reek, MD;  Location: [facility] CATH LAB;  Service: Cardiovascular;;  . PERCUTANEOUS CORONARY STENT INTERVENTION (PCI-S) N/A 10/12/2014   Procedure: PERCUTANEOUS CORONARY STENT INTERVENTION (PCI-S);  Surgeon: Leonie Man, MD;  Location: [facility] CATH LAB;  Ostial-Prox SVG-RCA Xience Alpine DES 3.0 mm x 38 mm; (3.4  - 3.3 mm)  . RETINAL LASER PROCEDURE Bilateral    "more than once"   . TONSILLECTOMY  as child  . TRANSTHORACIC ECHOCARDIOGRAM  03/2017    EF 55-60%.  Normal wall thickness.  No regional wall motion normalities.  GR 2 DD.  Mild-moderate calcified aortic valve.  Mild RV systolic dysfunction.  Moderate RV dilation.  Moderately increased PA pressures.  Domingo Dimes ECHOCARDIOGRAM  April 2016   Mod focal basal & mild overall LVH. EF 55-60%. Mild Ao Sclerosis, Mild MR with MAC. Only minimally elevated RVP.    MEDICATIONS: . acetaminophen (TYLENOL) 325 MG tablet  . amLODipine (NORVASC) 5 MG tablet  . aspirin EC 81 MG tablet  . carvedilol (COREG) 12.5 MG tablet  . clopidogrel (PLAVIX) 75 MG tablet  . docusate sodium (STOOL SOFTENER) 100 MG capsule  . Evolocumab (REPATHA SURECLICK) 263 MG/ML SOAJ  . fenofibrate 160 MG tablet  . furosemide (LASIX) 40 MG tablet  . HUMALOG KWIKPEN 200 UNIT/ML SOPN  . hydrALAZINE (APRESOLINE) 100 MG tablet  . isosorbide mononitrate (IMDUR) 60 MG 24 hr tablet  . neomycin-bacitracin-polymyxin (NEOSPORIN) ointment  . NITROSTAT 0.4 MG SL tablet  . pantoprazole (PROTONIX) 40 MG tablet  . rosuvastatin (CRESTOR) 10 MG tablet  . TOUJEO MAX SOLOSTAR 300 UNIT/ML SOPN  . VASCEPA 1 G CAPS   . 0.9 %  sodium chloride infusion  . 0.9 %  sodium chloride infusion     Wynonia Musty [facility] Short Stay Center/Anesthesiology Phone [phone] 02/17/2018 10:42 AM

## 2018-02-18 ENCOUNTER — Other Ambulatory Visit: Payer: Self-pay | Admitting: Pharmacist Clinician (PhC)/ Clinical Pharmacy Specialist

## 2018-02-18 DIAGNOSIS — E785 Hyperlipidemia, unspecified: Secondary | ICD-10-CM

## 2018-02-23 ENCOUNTER — Inpatient Hospital Stay (HOSPITAL_COMMUNITY): Payer: Medicare Other | Admitting: Anesthesiology

## 2018-02-23 ENCOUNTER — Inpatient Hospital Stay (HOSPITAL_COMMUNITY)
Admission: RE | Admit: 2018-02-23 | Discharge: 2018-02-24 | DRG: 271 | Disposition: A | Discharge status: Home / Self Care | Payer: Medicare Other | Attending: Vascular Surgery | Admitting: Vascular Surgery

## 2018-02-23 ENCOUNTER — Encounter (HOSPITAL_COMMUNITY): Payer: Self-pay | Admitting: *Deleted

## 2018-02-23 ENCOUNTER — Inpatient Hospital Stay (HOSPITAL_COMMUNITY): Payer: Medicare Other

## 2018-02-23 ENCOUNTER — Encounter (HOSPITAL_COMMUNITY)
Admission: RE | Disposition: A | Discharge status: Home / Self Care | Payer: Self-pay | Source: Home / Self Care | Attending: Vascular Surgery

## 2018-02-23 ENCOUNTER — Other Ambulatory Visit: Payer: Self-pay

## 2018-02-23 ENCOUNTER — Inpatient Hospital Stay (HOSPITAL_COMMUNITY): Payer: Medicare Other | Admitting: Physician Assistant

## 2018-02-23 DIAGNOSIS — E1122 Type 2 diabetes mellitus with diabetic chronic kidney disease: Secondary | ICD-10-CM | POA: Diagnosis not present

## 2018-02-23 DIAGNOSIS — I5032 Chronic diastolic (congestive) heart failure: Secondary | ICD-10-CM | POA: Diagnosis not present

## 2018-02-23 DIAGNOSIS — Z955 Presence of coronary angioplasty implant and graft: Secondary | ICD-10-CM | POA: Diagnosis not present

## 2018-02-23 DIAGNOSIS — I70202 Unspecified atherosclerosis of native arteries of extremities, left leg: Principal | ICD-10-CM | POA: Diagnosis present

## 2018-02-23 DIAGNOSIS — E7800 Pure hypercholesterolemia, unspecified: Secondary | ICD-10-CM | POA: Diagnosis present

## 2018-02-23 DIAGNOSIS — E1151 Type 2 diabetes mellitus with diabetic peripheral angiopathy without gangrene: Secondary | ICD-10-CM | POA: Diagnosis present

## 2018-02-23 DIAGNOSIS — Z951 Presence of aortocoronary bypass graft: Secondary | ICD-10-CM | POA: Diagnosis not present

## 2018-02-23 DIAGNOSIS — Z888 Allergy status to other drugs, medicaments and biological substances status: Secondary | ICD-10-CM

## 2018-02-23 DIAGNOSIS — E11319 Type 2 diabetes mellitus with unspecified diabetic retinopathy without macular edema: Secondary | ICD-10-CM | POA: Diagnosis not present

## 2018-02-23 DIAGNOSIS — Z79899 Other long term (current) drug therapy: Secondary | ICD-10-CM

## 2018-02-23 DIAGNOSIS — K219 Gastro-esophageal reflux disease without esophagitis: Secondary | ICD-10-CM | POA: Diagnosis present

## 2018-02-23 DIAGNOSIS — I2510 Atherosclerotic heart disease of native coronary artery without angina pectoris: Secondary | ICD-10-CM | POA: Diagnosis not present

## 2018-02-23 DIAGNOSIS — Z8249 Family history of ischemic heart disease and other diseases of the circulatory system: Secondary | ICD-10-CM | POA: Diagnosis not present

## 2018-02-23 DIAGNOSIS — I509 Heart failure, unspecified: Secondary | ICD-10-CM | POA: Diagnosis present

## 2018-02-23 DIAGNOSIS — I130 Hypertensive heart and chronic kidney disease with heart failure and stage 1 through stage 4 chronic kidney disease, or unspecified chronic kidney disease: Secondary | ICD-10-CM | POA: Diagnosis not present

## 2018-02-23 DIAGNOSIS — I252 Old myocardial infarction: Secondary | ICD-10-CM | POA: Diagnosis not present

## 2018-02-23 DIAGNOSIS — Z794 Long term (current) use of insulin: Secondary | ICD-10-CM | POA: Diagnosis not present

## 2018-02-23 DIAGNOSIS — I70213 Atherosclerosis of native arteries of extremities with intermittent claudication, bilateral legs: Secondary | ICD-10-CM | POA: Diagnosis not present

## 2018-02-23 DIAGNOSIS — Z419 Encounter for procedure for purposes other than remedying health state, unspecified: Secondary | ICD-10-CM

## 2018-02-23 DIAGNOSIS — I739 Peripheral vascular disease, unspecified: Secondary | ICD-10-CM | POA: Diagnosis present

## 2018-02-23 DIAGNOSIS — Z7982 Long term (current) use of aspirin: Secondary | ICD-10-CM

## 2018-02-23 DIAGNOSIS — N183 Chronic kidney disease, stage 3 (moderate): Secondary | ICD-10-CM | POA: Diagnosis present

## 2018-02-23 HISTORY — PX: ENDARTERECTOMY FEMORAL: SHX5804

## 2018-02-23 HISTORY — PX: PATCH ANGIOPLASTY: SHX6230

## 2018-02-23 HISTORY — PX: INSERTION OF ILIAC STENT: SHX6256

## 2018-02-23 LAB — GLUCOSE, CAPILLARY
GLUCOSE-CAPILLARY: 216 mg/dL — AB (ref 70–99)
Glucose-Capillary: 155 mg/dL — ABNORMAL HIGH (ref 70–99)
Glucose-Capillary: 212 mg/dL — ABNORMAL HIGH (ref 70–99)
Glucose-Capillary: 232 mg/dL — ABNORMAL HIGH (ref 70–99)
Glucose-Capillary: 205 mg/dL — ABNORMAL HIGH (ref 70–99)

## 2018-02-23 LAB — CBC
HEMATOCRIT: 26.5 % — AB (ref 39.0–52.0)
HEMOGLOBIN: 8.5 g/dL — AB (ref 13.0–17.0)
MCH: 28.4 pg (ref 26.0–34.0)
MCHC: 32.1 g/dL (ref 30.0–36.0)
MCV: 88.6 fL (ref 78.0–100.0)
Platelets: 236 10*3/uL (ref 150–400)
RBC: 2.99 MIL/uL — AB (ref 4.22–5.81)
RDW: 13.4 % (ref 11.5–15.5)
WBC: 9.8 10*3/uL (ref 4.0–10.5)

## 2018-02-23 LAB — POCT ACTIVATED CLOTTING TIME
ACTIVATED CLOTTING TIME: 290 s
ACTIVATED CLOTTING TIME: 175 s
Activated Clotting Time: 257 seconds
Activated Clotting Time: 230 seconds
Activated Clotting Time: 191 seconds

## 2018-02-23 LAB — CREATININE, SERUM
Creatinine, Ser: 2.59 mg/dL — ABNORMAL HIGH (ref 0.61–1.24)
GFR calc Af Amer: 26 mL/min — ABNORMAL LOW (ref >60)
GFR, EST NON AFRICAN AMERICAN: 23 mL/min — AB (ref >60)

## 2018-02-23 SURGERY — ENDARTERECTOMY, FEMORAL
Anesthesia: General | Laterality: Left

## 2018-02-23 MED ORDER — FUROSEMIDE 80 MG PO TABS
120.0000 mg | ORAL_TABLET | Freq: Two times a day (BID) | ORAL | Status: DC
  Administered 2018-02-23 – 2018-02-24 (×2): 120 mg via ORAL
  Filled 2018-02-23 (×2): qty 1

## 2018-02-23 MED ORDER — ICOSAPENT ETHYL 1 G PO CAPS: 2.0000 g | ORAL_CAPSULE | Freq: Two times a day (BID) | ORAL | Status: DC

## 2018-02-23 MED ORDER — EPHEDRINE 5 MG/ML INJ
INTRAVENOUS | Status: AC
  Filled 2018-02-23: qty 10

## 2018-02-23 MED ORDER — HEMOSTATIC AGENTS (NO CHARGE) OPTIME
TOPICAL | Status: DC | PRN
  Administered 2018-02-23 (×2): 1 via TOPICAL

## 2018-02-23 MED ORDER — NEOSTIGMINE METHYLSULFATE 10 MG/10ML IV SOLN
INTRAVENOUS | Status: DC | PRN
  Administered 2018-02-23: 4 mg via INTRAVENOUS

## 2018-02-23 MED ORDER — PANTOPRAZOLE SODIUM 40 MG PO TBEC
40.0000 mg | DELAYED_RELEASE_TABLET | Freq: Every day | ORAL | Status: DC
  Administered 2018-02-24: 40 mg via ORAL
  Filled 2018-02-23: qty 1

## 2018-02-23 MED ORDER — ASPIRIN EC 81 MG PO TBEC
81.0000 mg | DELAYED_RELEASE_TABLET | ORAL | Status: DC
  Administered 2018-02-23: 81 mg via ORAL
  Filled 2018-02-23: qty 1

## 2018-02-23 MED ORDER — OXYCODONE HCL 5 MG PO TABS: 5.0000 mg | ORAL_TABLET | Freq: Once | ORAL | Status: DC | PRN

## 2018-02-23 MED ORDER — LACTATED RINGERS IV SOLN
INTRAVENOUS | Status: DC | PRN
  Administered 2018-02-23 (×2): via INTRAVENOUS

## 2018-02-23 MED ORDER — SODIUM CHLORIDE 0.9 % IV SOLN: INTRAVENOUS | Status: DC

## 2018-02-23 MED ORDER — OMEGA-3-ACID ETHYL ESTERS 1 G PO CAPS
2.0000 g | ORAL_CAPSULE | Freq: Two times a day (BID) | ORAL | Status: DC
  Administered 2018-02-24: 2 g via ORAL
  Filled 2018-02-23 (×2): qty 2

## 2018-02-23 MED ORDER — CHLORHEXIDINE GLUCONATE 4 % EX LIQD: 60.0000 mL | Freq: Once | CUTANEOUS | Status: DC

## 2018-02-23 MED ORDER — ONDANSETRON HCL 4 MG/2ML IJ SOLN: 4.0000 mg | Freq: Four times a day (QID) | INTRAMUSCULAR | Status: DC | PRN

## 2018-02-23 MED ORDER — NITROGLYCERIN 0.4 MG SL SUBL: 0.4000 mg | SUBLINGUAL_TABLET | SUBLINGUAL | Status: DC | PRN

## 2018-02-23 MED ORDER — SODIUM CHLORIDE 0.9 % IV SOLN
INTRAVENOUS | Status: DC
  Administered 2018-02-23: 17:00:00 via INTRAVENOUS

## 2018-02-23 MED ORDER — FENTANYL CITRATE (PF) 250 MCG/5ML IJ SOLN
INTRAMUSCULAR | Status: AC
  Filled 2018-02-23: qty 5

## 2018-02-23 MED ORDER — PROPOFOL 10 MG/ML IV BOLUS
INTRAVENOUS | Status: DC | PRN
  Administered 2018-02-23: 10 mg via INTRAVENOUS
  Administered 2018-02-23: 140 mg via INTRAVENOUS

## 2018-02-23 MED ORDER — INSULIN ASPART 100 UNIT/ML ~~LOC~~ SOLN
5.0000 [IU] | Freq: Once | SUBCUTANEOUS | Status: AC
  Administered 2018-02-23: 5 [IU] via SUBCUTANEOUS

## 2018-02-23 MED ORDER — MIDAZOLAM HCL 5 MG/5ML IJ SOLN
INTRAMUSCULAR | Status: DC | PRN
  Administered 2018-02-23 (×2): 1 mg via INTRAVENOUS

## 2018-02-23 MED ORDER — HYDRALAZINE HCL 20 MG/ML IJ SOLN: 5.0000 mg | INTRAMUSCULAR | Status: DC | PRN

## 2018-02-23 MED ORDER — DOCUSATE SODIUM 100 MG PO CAPS: 100.0000 mg | ORAL_CAPSULE | Freq: Every day | ORAL | Status: DC | PRN

## 2018-02-23 MED ORDER — ARTIFICIAL TEARS OPHTHALMIC OINT
TOPICAL_OINTMENT | OPHTHALMIC | Status: AC
  Filled 2018-02-23: qty 3.5

## 2018-02-23 MED ORDER — ONDANSETRON HCL 4 MG/2ML IJ SOLN
INTRAMUSCULAR | Status: AC
  Filled 2018-02-23: qty 2

## 2018-02-23 MED ORDER — OXYCODONE HCL 5 MG/5ML PO SOLN: 5.0000 mg | Freq: Once | ORAL | Status: DC | PRN

## 2018-02-23 MED ORDER — FENTANYL CITRATE (PF) 100 MCG/2ML IJ SOLN
25.0000 ug | INTRAMUSCULAR | Status: DC | PRN
  Administered 2018-02-23: 25 ug via INTRAVENOUS

## 2018-02-23 MED ORDER — BISACODYL 10 MG RE SUPP: 10.0000 mg | Freq: Every day | RECTAL | Status: DC | PRN

## 2018-02-23 MED ORDER — INSULIN ASPART 100 UNIT/ML ~~LOC~~ SOLN
0.0000 [IU] | Freq: Three times a day (TID) | SUBCUTANEOUS | Status: DC
  Administered 2018-02-24: 5 [IU] via SUBCUTANEOUS
  Administered 2018-02-24: 8 [IU] via SUBCUTANEOUS

## 2018-02-23 MED ORDER — GLYCOPYRROLATE PF 0.2 MG/ML IJ SOSY
PREFILLED_SYRINGE | INTRAMUSCULAR | Status: DC | PRN
  Administered 2018-02-23: 0.6 mg via INTRAVENOUS

## 2018-02-23 MED ORDER — GLYCOPYRROLATE PF 0.2 MG/ML IJ SOSY
PREFILLED_SYRINGE | INTRAMUSCULAR | Status: AC
  Filled 2018-02-23: qty 2

## 2018-02-23 MED ORDER — FENTANYL CITRATE (PF) 100 MCG/2ML IJ SOLN
INTRAMUSCULAR | Status: AC
  Filled 2018-02-23: qty 2

## 2018-02-23 MED ORDER — CARVEDILOL 12.5 MG PO TABS
12.5000 mg | ORAL_TABLET | Freq: Two times a day (BID) | ORAL | Status: DC
  Administered 2018-02-23 – 2018-02-24 (×2): 12.5 mg via ORAL
  Filled 2018-02-23 (×2): qty 1

## 2018-02-23 MED ORDER — FENOFIBRATE 160 MG PO TABS
160.0000 mg | ORAL_TABLET | Freq: Every day | ORAL | Status: DC
  Administered 2018-02-24: 160 mg via ORAL
  Filled 2018-02-23: qty 1

## 2018-02-23 MED ORDER — DOCUSATE SODIUM 100 MG PO CAPS
100.0000 mg | ORAL_CAPSULE | Freq: Every day | ORAL | Status: DC
  Administered 2018-02-24: 100 mg via ORAL
  Filled 2018-02-23: qty 1

## 2018-02-23 MED ORDER — CEFAZOLIN SODIUM-DEXTROSE 2-4 GM/100ML-% IV SOLN
2.0000 g | Freq: Three times a day (TID) | INTRAVENOUS | Status: AC
  Administered 2018-02-23 – 2018-02-24 (×2): 2 g via INTRAVENOUS
  Filled 2018-02-23 (×2): qty 100

## 2018-02-23 MED ORDER — DEXAMETHASONE SODIUM PHOSPHATE 10 MG/ML IJ SOLN
INTRAMUSCULAR | Status: DC | PRN
  Administered 2018-02-23: 5 mg via INTRAVENOUS

## 2018-02-23 MED ORDER — MIDAZOLAM HCL 2 MG/2ML IJ SOLN
INTRAMUSCULAR | Status: AC
  Filled 2018-02-23: qty 2

## 2018-02-23 MED ORDER — POLYETHYLENE GLYCOL 3350 17 G PO PACK: 17.0000 g | PACK | Freq: Every day | ORAL | Status: DC | PRN

## 2018-02-23 MED ORDER — SODIUM CHLORIDE 0.9 % IV SOLN
INTRAVENOUS | Status: AC
  Filled 2018-02-23: qty 1.2

## 2018-02-23 MED ORDER — LIDOCAINE HCL (CARDIAC) PF 100 MG/5ML IV SOSY
PREFILLED_SYRINGE | INTRAVENOUS | Status: DC | PRN
  Administered 2018-02-23: 30 mg via INTRAVENOUS
  Administered 2018-02-23: 70 mg via INTRAVENOUS

## 2018-02-23 MED ORDER — EPHEDRINE SULFATE 50 MG/ML IJ SOLN
INTRAMUSCULAR | Status: DC | PRN
  Administered 2018-02-23: 10 mg via INTRAVENOUS
  Administered 2018-02-23: 5 mg via INTRAVENOUS
  Administered 2018-02-23 (×3): 10 mg via INTRAVENOUS
  Administered 2018-02-23: 5 mg via INTRAVENOUS

## 2018-02-23 MED ORDER — FENTANYL CITRATE (PF) 100 MCG/2ML IJ SOLN
INTRAMUSCULAR | Status: DC | PRN
  Administered 2018-02-23 (×6): 50 ug via INTRAVENOUS

## 2018-02-23 MED ORDER — LIDOCAINE 2% (20 MG/ML) 5 ML SYRINGE
INTRAMUSCULAR | Status: AC
  Filled 2018-02-23: qty 5

## 2018-02-23 MED ORDER — PHENYLEPHRINE 40 MCG/ML (10ML) SYRINGE FOR IV PUSH (FOR BLOOD PRESSURE SUPPORT)
PREFILLED_SYRINGE | INTRAVENOUS | Status: AC
  Filled 2018-02-23: qty 10

## 2018-02-23 MED ORDER — BACITRACIN-NEOMYCIN-POLYMYXIN 400-5-5000 EX OINT
1.0000 "application " | TOPICAL_OINTMENT | Freq: Three times a day (TID) | CUTANEOUS | Status: DC | PRN
  Filled 2018-02-23: qty 1

## 2018-02-23 MED ORDER — ISOSORBIDE MONONITRATE ER 60 MG PO TB24
60.0000 mg | ORAL_TABLET | Freq: Every day | ORAL | Status: DC
  Administered 2018-02-24: 60 mg via ORAL
  Filled 2018-02-23: qty 1

## 2018-02-23 MED ORDER — MORPHINE SULFATE (PF) 2 MG/ML IV SOLN
2.0000 mg | INTRAVENOUS | Status: DC | PRN
  Administered 2018-02-23: 2 mg via INTRAVENOUS
  Filled 2018-02-23: qty 1

## 2018-02-23 MED ORDER — DEXAMETHASONE SODIUM PHOSPHATE 10 MG/ML IJ SOLN
INTRAMUSCULAR | Status: AC
  Filled 2018-02-23: qty 1

## 2018-02-23 MED ORDER — SODIUM CHLORIDE 0.9 % IV SOLN
INTRAVENOUS | Status: DC | PRN
  Administered 2018-02-23: 09:00:00

## 2018-02-23 MED ORDER — ONDANSETRON HCL 4 MG/2ML IJ SOLN
INTRAMUSCULAR | Status: DC | PRN
  Administered 2018-02-23: 4 mg via INTRAVENOUS

## 2018-02-23 MED ORDER — SODIUM CHLORIDE 0.9 % IV SOLN: 500.0000 mL | Freq: Once | INTRAVENOUS | Status: DC | PRN

## 2018-02-23 MED ORDER — ROSUVASTATIN CALCIUM 10 MG PO TABS
10.0000 mg | ORAL_TABLET | ORAL | Status: DC
  Administered 2018-02-23: 10 mg via ORAL
  Filled 2018-02-23: qty 1

## 2018-02-23 MED ORDER — HEPARIN SODIUM (PORCINE) 1000 UNIT/ML IJ SOLN
INTRAMUSCULAR | Status: AC
  Filled 2018-02-23: qty 3

## 2018-02-23 MED ORDER — ALBUMIN HUMAN 5 % IV SOLN
INTRAVENOUS | Status: DC | PRN
  Administered 2018-02-23: 10:00:00 via INTRAVENOUS

## 2018-02-23 MED ORDER — PHENOL 1.4 % MT LIQD: 1.0000 | OROMUCOSAL | Status: DC | PRN

## 2018-02-23 MED ORDER — SODIUM CHLORIDE 0.9 % IJ SOLN
INTRAMUSCULAR | Status: AC
  Filled 2018-02-23: qty 40

## 2018-02-23 MED ORDER — ROCURONIUM BROMIDE 50 MG/5ML IV SOSY
PREFILLED_SYRINGE | INTRAVENOUS | Status: AC
  Filled 2018-02-23: qty 5

## 2018-02-23 MED ORDER — OXYCODONE-ACETAMINOPHEN 5-325 MG PO TABS
1.0000 | ORAL_TABLET | ORAL | Status: DC | PRN
  Administered 2018-02-23: 2 via ORAL
  Filled 2018-02-23: qty 2

## 2018-02-23 MED ORDER — HEPARIN SODIUM (PORCINE) 1000 UNIT/ML IJ SOLN
INTRAMUSCULAR | Status: DC | PRN
  Administered 2018-02-23 (×2): 10000 [IU] via INTRAVENOUS
  Administered 2018-02-23: 7000 [IU] via INTRAVENOUS

## 2018-02-23 MED ORDER — 0.9 % SODIUM CHLORIDE (POUR BTL) OPTIME
TOPICAL | Status: DC | PRN
  Administered 2018-02-23 (×3): 1000 mL

## 2018-02-23 MED ORDER — ROCURONIUM BROMIDE 50 MG/5ML IV SOSY
PREFILLED_SYRINGE | INTRAVENOUS | Status: DC | PRN
  Administered 2018-02-23: 20 mg via INTRAVENOUS
  Administered 2018-02-23: 40 mg via INTRAVENOUS
  Administered 2018-02-23: 10 mg via INTRAVENOUS

## 2018-02-23 MED ORDER — PROPOFOL 10 MG/ML IV BOLUS
INTRAVENOUS | Status: AC
  Filled 2018-02-23: qty 40

## 2018-02-23 MED ORDER — MAGNESIUM SULFATE 2 GM/50ML IV SOLN: 2.0000 g | Freq: Every day | INTRAVENOUS | Status: DC | PRN

## 2018-02-23 MED ORDER — POTASSIUM CHLORIDE CRYS ER 20 MEQ PO TBCR: 20.0000 meq | EXTENDED_RELEASE_TABLET | Freq: Every day | ORAL | Status: DC | PRN

## 2018-02-23 MED ORDER — SODIUM CHLORIDE 0.9 % IV SOLN
INTRAVENOUS | Status: DC | PRN
  Administered 2018-02-23: 50 ug/min via INTRAVENOUS

## 2018-02-23 MED ORDER — HYDRALAZINE HCL 50 MG PO TABS
100.0000 mg | ORAL_TABLET | Freq: Three times a day (TID) | ORAL | Status: DC
  Administered 2018-02-23 – 2018-02-24 (×2): 100 mg via ORAL
  Filled 2018-02-23 (×2): qty 2

## 2018-02-23 MED ORDER — LABETALOL HCL 5 MG/ML IV SOLN: 10.0000 mg | INTRAVENOUS | Status: DC | PRN

## 2018-02-23 MED ORDER — AMLODIPINE BESYLATE 5 MG PO TABS
5.0000 mg | ORAL_TABLET | Freq: Every day | ORAL | Status: DC
  Administered 2018-02-23 – 2018-02-24 (×2): 5 mg via ORAL
  Filled 2018-02-23 (×2): qty 1

## 2018-02-23 MED ORDER — INSULIN ASPART 100 UNIT/ML ~~LOC~~ SOLN
SUBCUTANEOUS | Status: AC
  Filled 2018-02-23: qty 1

## 2018-02-23 MED ORDER — CLOPIDOGREL BISULFATE 75 MG PO TABS
75.0000 mg | ORAL_TABLET | Freq: Every day | ORAL | Status: DC
  Administered 2018-02-24: 75 mg via ORAL
  Filled 2018-02-23: qty 1

## 2018-02-23 MED ORDER — CEFAZOLIN SODIUM-DEXTROSE 2-4 GM/100ML-% IV SOLN
2.0000 g | INTRAVENOUS | Status: AC
  Administered 2018-02-23: 2 g via INTRAVENOUS
  Filled 2018-02-23 (×2): qty 100

## 2018-02-23 MED ORDER — GUAIFENESIN-DM 100-10 MG/5ML PO SYRP: 15.0000 mL | ORAL_SOLUTION | ORAL | Status: DC | PRN

## 2018-02-23 MED ORDER — HEPARIN SODIUM (PORCINE) 5000 UNIT/ML IJ SOLN: 5000.0000 [IU] | Freq: Three times a day (TID) | INTRAMUSCULAR | Status: DC

## 2018-02-23 MED ORDER — PHENYLEPHRINE 40 MCG/ML (10ML) SYRINGE FOR IV PUSH (FOR BLOOD PRESSURE SUPPORT)
PREFILLED_SYRINGE | INTRAVENOUS | Status: AC
  Filled 2018-02-23: qty 20

## 2018-02-23 MED ORDER — METOPROLOL TARTRATE 5 MG/5ML IV SOLN: 2.0000 mg | INTRAVENOUS | Status: DC | PRN

## 2018-02-23 MED ORDER — ACETAMINOPHEN 325 MG PO TABS: 325.0000 mg | ORAL_TABLET | ORAL | Status: DC | PRN

## 2018-02-23 MED ORDER — PHENYLEPHRINE HCL 10 MG/ML IJ SOLN
INTRAMUSCULAR | Status: DC | PRN
  Administered 2018-02-23 (×4): 80 ug via INTRAVENOUS

## 2018-02-23 MED ORDER — ONDANSETRON HCL 4 MG/2ML IJ SOLN: 4.0000 mg | Freq: Once | INTRAMUSCULAR | Status: DC | PRN

## 2018-02-23 MED ORDER — IODIXANOL 320 MG/ML IV SOLN
INTRAVENOUS | Status: DC | PRN
  Administered 2018-02-23: 66.4 mL via INTRA_ARTERIAL

## 2018-02-23 MED ORDER — ACETAMINOPHEN 325 MG RE SUPP: 325.0000 mg | RECTAL | Status: DC | PRN

## 2018-02-23 SURGICAL SUPPLY — 83 items
CANISTER SUCT 3000ML PPV (MISCELLANEOUS) ×3 IMPLANT
CANNULA VESSEL 3MM 2 BLNT TIP (CANNULA) ×3 IMPLANT
CATH BEACON 5 .035 65 KMP TIP (CATHETERS) ×3 IMPLANT
CATH OMNI FLUSH .035X70CM (CATHETERS) ×3 IMPLANT
CATH OMNI FLUSH 5F 65CM (CATHETERS) ×3 IMPLANT
CLIP VESOCCLUDE MED 24/CT (CLIP) ×3 IMPLANT
CLIP VESOCCLUDE MED 6/CT (CLIP) IMPLANT
CLIP VESOCCLUDE SM WIDE 24/CT (CLIP) ×3 IMPLANT
CLIP VESOCCLUDE SM WIDE 6/CT (CLIP) IMPLANT
COVER PROBE W GEL 5X96 (DRAPES) ×3 IMPLANT
DERMABOND ADVANCED (GAUZE/BANDAGES/DRESSINGS) ×1
DERMABOND ADVANCED .7 DNX12 (GAUZE/BANDAGES/DRESSINGS) ×2 IMPLANT
DRAIN CHANNEL 15F RND FF W/TCR (WOUND CARE) IMPLANT
DRAIN SNY WOU (WOUND CARE) IMPLANT
DRAPE C-ARM 42X72 X-RAY (DRAPES) ×3 IMPLANT
ELECT REM PT RETURN 9FT ADLT (ELECTROSURGICAL) ×3
ELECTRODE REM PT RTRN 9FT ADLT (ELECTROSURGICAL) ×2 IMPLANT
EVACUATOR SILICONE 100CC (DRAIN) IMPLANT
GAUZE 4X4 16PLY RFD (DISPOSABLE) IMPLANT
GAUZE SPONGE 4X4 12PLY STRL LF (GAUZE/BANDAGES/DRESSINGS) ×3 IMPLANT
GLOVE BIO SURGEON STRL SZ 6.5 (GLOVE) ×6 IMPLANT
GLOVE BIO SURGEON STRL SZ7.5 (GLOVE) ×6 IMPLANT
GLOVE BIOGEL PI IND STRL 6 (GLOVE) ×2 IMPLANT
GLOVE BIOGEL PI IND STRL 6.5 (GLOVE) ×8 IMPLANT
GLOVE BIOGEL PI IND STRL 7.0 (GLOVE) ×6 IMPLANT
GLOVE BIOGEL PI IND STRL 7.5 (GLOVE) ×4 IMPLANT
GLOVE BIOGEL PI IND STRL 8 (GLOVE) IMPLANT
GLOVE BIOGEL PI INDICATOR 6 (GLOVE) ×1
GLOVE BIOGEL PI INDICATOR 6.5 (GLOVE) ×4
GLOVE BIOGEL PI INDICATOR 7.0 (GLOVE) ×3
GLOVE BIOGEL PI INDICATOR 7.5 (GLOVE) ×2
GLOVE BIOGEL PI INDICATOR 8 (GLOVE)
GLOVE ECLIPSE 7.0 STRL STRAW (GLOVE) ×3 IMPLANT
GLOVE SURG SS PI 6.5 STRL IVOR (GLOVE) ×9 IMPLANT
GLOVE SURG SS PI 7.5 STRL IVOR (GLOVE) ×3 IMPLANT
GOWN STRL REUS W/ TWL LRG LVL3 (GOWN DISPOSABLE) ×16 IMPLANT
GOWN STRL REUS W/ TWL XL LVL3 (GOWN DISPOSABLE) ×2 IMPLANT
GOWN STRL REUS W/TWL LRG LVL3 (GOWN DISPOSABLE) ×8
GOWN STRL REUS W/TWL XL LVL3 (GOWN DISPOSABLE) ×1
GRAFT BALLN CATH 65CM (STENTS) IMPLANT
GRAFT VASC PATCH XENOSURE 1X14 (Vascular Products) ×3 IMPLANT
GUIDEWIRE ANGLED .035X150CM (WIRE) ×3 IMPLANT
HEMOSTAT SPONGE AVITENE ULTRA (HEMOSTASIS) ×6 IMPLANT
INSERT FOGARTY SM (MISCELLANEOUS) IMPLANT
KIT BASIN OR (CUSTOM PROCEDURE TRAY) ×3 IMPLANT
KIT ENCORE 26 ADVANTAGE (KITS) ×6 IMPLANT
KIT TURNOVER KIT B (KITS) ×3 IMPLANT
LOOP VESSEL MINI RED (MISCELLANEOUS) ×3 IMPLANT
NEEDLE PERC 18GX7CM (NEEDLE) ×3 IMPLANT
NS IRRIG 1000ML POUR BTL (IV SOLUTION) ×9 IMPLANT
PACK PERIPHERAL VASCULAR (CUSTOM PROCEDURE TRAY) ×3 IMPLANT
PAD ARMBOARD 7.5X6 YLW CONV (MISCELLANEOUS) ×6 IMPLANT
SET MICROPUNCTURE 5F STIFF (MISCELLANEOUS) ×3 IMPLANT
SHEATH BRITE TIP 7FR 35CM (SHEATH) ×6 IMPLANT
STAPLER VISISTAT (STAPLE) IMPLANT
STENT GRAFT BALLN CATH 65CM (STENTS)
STENT VIABAHN 8X39X80 VBX (Permanent Stent) ×6 IMPLANT
SUT ETHILON 3 0 PS 1 (SUTURE) IMPLANT
SUT PROLENE 5 0 C 1 24 (SUTURE) ×6 IMPLANT
SUT PROLENE 6 0 BV (SUTURE) ×3 IMPLANT
SUT PROLENE 6 0 CC (SUTURE) ×18 IMPLANT
SUT PROLENE 7 0 BV 1 (SUTURE) ×6 IMPLANT
SUT PROLENE 7 0 BV1 MDA (SUTURE) ×3 IMPLANT
SUT SILK 2 0 PERMA HAND 18 BK (SUTURE) IMPLANT
SUT SILK 2 0 SH (SUTURE) IMPLANT
SUT VIC AB 2-0 SH 18 (SUTURE) IMPLANT
SUT VIC AB 2-0 SH 27 (SUTURE) ×1
SUT VIC AB 2-0 SH 27XBRD (SUTURE) ×2 IMPLANT
SUT VIC AB 3-0 SH 27 (SUTURE) ×2
SUT VIC AB 3-0 SH 27X BRD (SUTURE) ×4 IMPLANT
SUT VICRYL 4-0 PS2 18IN ABS (SUTURE) ×3 IMPLANT
SYR 10ML LL (SYRINGE) ×9 IMPLANT
SYR 30ML LL (SYRINGE) ×3 IMPLANT
SYR MEDRAD MARK V 150ML (SYRINGE) IMPLANT
SYRINGE 3CC LL L/F (MISCELLANEOUS) ×3 IMPLANT
TAPE CLOTH SURG 4X10 WHT LF (GAUZE/BANDAGES/DRESSINGS) ×3 IMPLANT
TOWEL GREEN STERILE (TOWEL DISPOSABLE) ×3 IMPLANT
TRAY FOLEY MTR SLVR 16FR STAT (SET/KITS/TRAYS/PACK) ×3 IMPLANT
TUBING HIGH PRESSURE 120CM (CONNECTOR) IMPLANT
UNDERPAD 30X30 (UNDERPADS AND DIAPERS) ×3 IMPLANT
WATER STERILE IRR 1000ML POUR (IV SOLUTION) ×3 IMPLANT
WIRE BENTSON .035X145CM (WIRE) ×3 IMPLANT
WIRE EMERALD 3MM-J .035X150CM (WIRE) ×3 IMPLANT

## 2018-02-23 NOTE — Interval H&P Note (Signed)
History and Physical Interval Note:  02/23/2018 7:22 AM  Lance Collier  has presented today for surgery, with the diagnosis of PERIPHERAL ARTERY DISEASE  The various methods of treatment have been discussed with the patient and family. After consideration of risks, benefits and other options for treatment, the patient has consented to  Procedure(s): LEFT FEMORAL ENDARTERECTOMY LEFT SUPERFICIAL FEMORAL  ARTERY STENT (Left) INSERTION OF BILATERAL COMMON ILIAC STENTS WITH VIABAHN STENTS (Bilateral) as a surgical intervention .  The patient's history has been reviewed, patient examined, no change in status, stable for surgery.  I have reviewed the patient's chart and labs.  Questions were answered to the patient's satisfaction.     Ruta Hinds

## 2018-02-23 NOTE — Anesthesia Postprocedure Evaluation (Signed)
Anesthesia Post Note  Patient: Lance Collier  Procedure(s) Performed: LEFT COMMON FEMORAL ENDARTERECTOMY (Left ) INSERTION OF BILATERAL COMMON ILIAC STENTS WITH VIABAHN STENTS (Bilateral ) PATCH ANGIOPLASTY LEFT COMMON FEMORAL ARTERY (Left )     Patient location during evaluation: PACU Anesthesia Type: General Level of consciousness: awake and alert Pain management: pain level controlled Vital Signs Assessment: post-procedure vital signs reviewed and stable Respiratory status: spontaneous breathing, nonlabored ventilation, respiratory function stable and patient connected to nasal cannula oxygen Cardiovascular status: blood pressure returned to baseline and stable Postop Assessment: no apparent nausea or vomiting Anesthetic complications: no    Last Vitals:  Vitals:   02/23/18 1600 02/23/18 1630  BP: (!) 120/55 (!) 132/55  Pulse: 66 64  Resp: 18 14  Temp:  36.7 C  SpO2: 100% 99%    Last Pain:  Vitals:   02/23/18 1630  TempSrc:   PainSc: Asleep    LLE Motor Response: Purposeful movement;Responds to commands (02/23/18 1630) LLE Sensation: No numbness;No tingling (02/23/18 1630) RLE Motor Response: Purposeful movement;Responds to commands (02/23/18 1630) RLE Sensation: No numbness;No tingling (02/23/18 1630)      [name]

## 2018-02-23 NOTE — Progress Notes (Signed)
Patient arrived to 4E room 09 at this time. V/s and assessment done. Telemetry applied and CCMD notified. 2nd verifier present. Patient oriented to room and how to call nurse with any needs.   Emelda Fear, RN

## 2018-02-23 NOTE — Discharge Instructions (Signed)
 [facility]  Discharge instructions  Lower Extremity Bypass Surgery  Please refer to the following instruction for your post-procedure care. Your surgeon or physician assistant will discuss any changes with you.  Activity  You are encouraged to walk as much as you can. You can slowly return to normal activities during the month after your surgery. Avoid strenuous activity and heavy lifting until your doctor tells you it's OK. Avoid activities such as vacuuming or swinging a golf club. Do not drive until your doctor give the OK and you are no longer taking prescription pain medications. It is also normal to have difficulty with sleep habits, eating and bowel movement after surgery. These will go away with time.  Bathing/Showering  Shower daily after you go home. Do not soak in a bathtub, hot tub, or swim until the incision heals completely.  Incision Care  Clean your incision with mild soap and water. Shower every day. Pat the area dry with a clean towel. You do not need a bandage unless otherwise instructed. Do not apply any ointments or creams to your incision. If you have open wounds you will be instructed how to care for them or a visiting nurse may be arranged for you. If you have staples or sutures along your incision they will be removed at your post-op appointment. You may have skin glue on your incision. Do not peel it off. It will come off on its own in about one week.  Wash the groin wound with soap and water daily and pat dry. (No tub bath-only shower)  Then put a dry gauze or washcloth in the groin to keep this area dry to help prevent wound infection.  Do this daily and as needed.  Do not use Vaseline or neosporin on your incisions.  Only use soap and water on your incisions and then protect and keep dry.  Diet  Resume your normal diet. There are no special food restrictions following this procedure. A low fat/ low cholesterol diet is  recommended for all patients with vascular disease. In order to heal from your surgery, it is CRITICAL to get adequate nutrition. Your body requires vitamins, minerals, and protein. Vegetables are the best source of vitamins and minerals. Vegetables also provide the perfect balance of protein. Processed food has little nutritional value, so try to avoid this.  Medications  Resume taking all your medications unless your doctor or physician assistant tells you not to. If your incision is causing pain, you may take over-the-counter pain relievers such as acetaminophen (Tylenol). If you were prescribed a stronger pain medication, please aware these medication can cause nausea and constipation. Prevent nausea by taking the medication with a snack or meal. Avoid constipation by drinking plenty of fluids and eating foods with high amount of fiber, such as fruits, vegetables, and grains. Take Colace 100 mg (an over-the-counter stool softener) twice a day as needed for constipation.  Do not take Tylenol if you are taking prescription pain medications.  Follow Up  Our office will schedule a follow up appointment 2-3 weeks following discharge.  Please call us immediately for any of the following conditions  Severe or worsening pain in your legs or feet while at rest or while walking Increase pain, redness, warmth, or drainage (pus) from your incision site(s) Fever of 101 degree or higher The swelling in your leg with the bypass suddenly worsens and becomes more painful than when you were in the hospital If you have   been instructed to feel your graft pulse then you should do so every day. If you can no longer feel this pulse, call the office immediately. Not all patients are given this instruction.  Leg swelling is common after leg bypass surgery.  The swelling should improve over a few months following surgery. To improve the swelling, you may elevate your legs above the level of your heart while you are  sitting or resting. Your surgeon or physician assistant may ask you to apply an ACE wrap or wear compression (TED) stockings to help to reduce swelling.  Reduce your risk of vascular disease  Stop smoking. If you would like help call [facility] at 1-800-QUIT-NOW (1-800-784-8669) or [place] at 336-586-4000.  Manage your cholesterol Maintain a desired weight Control your diabetes weight Control your diabetes Keep your blood pressure down  If you have any questions, please call the office at [phone]  

## 2018-02-23 NOTE — Op Note (Signed)
Procedure: Abdominal aortogram bilateral retrograde common femoral puncture, bilateral common iliac stent (8 x 39 VBX), left external iliac common femoral superficial femoral endarterectomy  Preoperative diagnosis: Claudication  Postoperative diagnosis: Same  Anesthesia: General  Assistant: Annamarie Major, MD, Gerri Lins, PA-C  Operative findings: #1 severe calcific aortoiliac common femoral disease  2.  Patent but slightly narrowed left superficial femoral artery origin at conclusion of case  3.  Bovine pericardial patch extending from the distal external iliac artery about 3 cm off of the superficial femoral artery  4.  Bilateral common iliac covered balloon expandable and VBX stents 8 x 39  5: 66 cc total contrast  Operative details: After obtaining informed consent, the patient was taken the operating.  The patient was placed in supine position operating table.  After induction of general anesthesia and endotracheal intubation, Foley catheter was placed.  Next patient was prepped and draped in usual sterile fashion from the umbilicus down to the toes.  Ultrasound was used to identify the left common femoral artery and femoral bifurcation.  An introducer needle was used to cannulate the left common femoral artery without difficulty.  An 21 versacore wire was then threaded up into the right iliac system.  A 7 French bright tip long sheath was advanced over the guidewire up in the mid right iliac.  The guidewire would not initially pass across the stenosis in the right common iliac.  Therefore using a KMP catheter I was able to direct this and pushed an 035 angled Glidewire up in the abdominal aorta and advanced the catheter over this.  Wire was then exchanged for a Bentson wire.  This was left in place and the sheath flushed intermittently during the course of the case.  Attention was then turned to the left groin.  Longitudinal incision was made in left groin carried on the subcutaneous  tissues down level left common femoral artery.  This was severely calcified with no pulse within it.  It was dissected free circumferentially up underneath the inguinal ligament.  All the way up to the circumflex iliac branches it was heavily calcified.  I was able to find more pulsatile character in the artery above this level which was about 3 cm underneath the inguinal ligament.  Vessel loop was placed around this.  Circumflex iliac branches were also dissected free and Vesseloops placed around this.  Common femoral artery was dissected free circumferentially elevate down to the femoral bifurcation.  The profunda femoris and several large side branches were dissected free circumferentially and Vesseloops placed around these.  Superficial femoral artery was dissected free circumferentially and a vessel loop placed around this.  There was a large amount of posterior plaque in the superficial femoral artery.  At this point the patient was given 10,000 units of intravenous heparin.  He was given an additional 10,000 units of heparin and 1 dose of 5000 units of heparin during the course of the case.  Longitudinal arteriotomy in the common femoral artery was extended proximally up underneath inguinal ligament.  There was a large amount of calcified exophytic plaque nearly obstructing the entire lumen of the artery.  Endarterectomy was performed in a good proximal endpoint was obtained in the distal external iliac artery.  The distal endpoint for the profunda was a nice flush endpoint at the origin the artery.  The superficial femoral artery endpoint was difficult to obtain due to circumferential and a large heap of posterior plaque.  I was able to obtain a reasonable endpoint  but the artery was quite thinned out at this point.  All loose debris was removed.  A bovine pericardial patch was brought up on the operative field and sent on as a patch angioplasty extending from about 3 cm into this the SFA origin all the  way up to 3 cm and the external iliac artery.  Prior to completion anastomosis it was forebled backbled and thoroughly flushed.  Clamps released and there was bleeding from the toe of the distal patch and the superficial femoral artery.  This required several additional 6-0 and 7-0 Prolene sutures to obtain hemostasis.  There was some narrowing of the superficial femoral artery at its origin at this point but I did not feel I could make this any better than it currently was.  I did decide not to entertain doing a superficial femoral artery stent at the same setting due to multiple flaps within the proximal portion of the artery.  At this point an introducer needle was used to cannulate the anterior wall of the patch just above the femoral bifurcation.  An 035 J-wire was then advanced up in the abdominal aorta and a 7 French bright tip sheath placed over this into the distal iliac artery.  Contrast angiogram was performed using roadmapping techniques the distal aorta as well as the iliac bifurcation and common iliac arteries were identified as well as the narrowings in the origin the common iliacs bilaterally.  Two 8 x 67 VBX stents were centered on the lesions extending up to the aortic bifurcation.  These were then deployed to 14 atm for 1 minute.  Completion angiogram showed no evidence of dissection and widely patent common iliac arteries bilaterally.  Sheath was removed and the hole repaired with several 6-0 Prolene sutures.  Hemostasis was obtained in the left groin with direct pressure and Avitene.  The groin was then closed in multiple layers of running 2-0 and 3-0 Vicryl suture and a 4-0 Vicryl subcuticular stitch in the skin.  Dermabond was applied to the skin.  Patient tired the procedure well and there were no complications.  The instrument sponge needle count was correct the end of the case.  Patient had a biphasic dorsalis pedis and monophasic posterior tibial Doppler signal in the left foot at the  conclusion of the case.  Ruta Hinds, MD [facility] Office: [phone] Pager: [phone]

## 2018-02-23 NOTE — Anesthesia Preprocedure Evaluation (Signed)
Anesthesia Evaluation  Patient identified by MRN, date of birth, ID band Patient awake    Reviewed: Allergy & Precautions, NPO status , Patient's Chart, lab work & pertinent test results  Airway Mallampati: II  TM Distance: >3 FB Neck ROM: Full    Dental  (+) Teeth Intact, Dental Advisory Given   Pulmonary former smoker,    breath sounds clear to auscultation       Cardiovascular hypertension,  Rhythm:Regular Rate:Normal     Neuro/Psych    GI/Hepatic   Endo/Other  diabetes  Renal/GU      Musculoskeletal   Abdominal   Peds  Hematology   Anesthesia Other Findings   Reproductive/Obstetrics                             Anesthesia Physical Anesthesia Plan  ASA: III  Anesthesia Plan: General   Post-op Pain Management:    Induction: Intravenous  PONV Risk Score and Plan: Ondansetron and Dexamethasone  Airway Management Planned: Oral ETT  Additional Equipment:   Intra-op Plan:   Post-operative Plan: Extubation in OR  Informed Consent: I have reviewed the patients History and Physical, chart, labs and discussed the procedure including the risks, benefits and alternatives for the proposed anesthesia with the patient or authorized representative who has indicated his/her understanding and acceptance.   Dental advisory given  Plan Discussed with: CRNA and Anesthesiologist  Anesthesia Plan Comments:         Anesthesia Quick Evaluation

## 2018-02-23 NOTE — Transfer of Care (Signed)
Immediate Anesthesia Transfer of Care Note  Patient: Lance Collier  Procedure(s) Performed: LEFT COMMON FEMORAL ENDARTERECTOMY (Left ) INSERTION OF BILATERAL COMMON ILIAC STENTS WITH VIABAHN STENTS (Bilateral ) PATCH ANGIOPLASTY LEFT COMMON FEMORAL ARTERY (Left )  Patient Location: PACU  Anesthesia Type:General  Level of Consciousness: awake, alert , oriented and sedated  Airway & Oxygen Therapy: Patient Spontanous Breathing and Patient connected to face mask oxygen  Post-op Assessment: Report given to RN, Post -op Vital signs reviewed and stable and Patient moving all extremities  Post vital signs: Reviewed and stable  Last Vitals:  Vitals Value Taken Time  BP 114/43 02/23/2018  1:32 PM  Temp    Pulse 66 02/23/2018  1:37 PM  Resp 18 02/23/2018  1:37 PM  SpO2 95 % 02/23/2018  1:37 PM  Vitals shown include unvalidated device data.  Last Pain:  Vitals:   02/23/18 0606  TempSrc:   PainSc: 1          Complications: No apparent anesthesia complications

## 2018-02-23 NOTE — Anesthesia Procedure Notes (Signed)
Procedure Name: Intubation Date/Time: 02/23/2018 7:47 AM Performed by: Scheryl Darter, CRNA Pre-anesthesia Checklist: Patient identified, Emergency Drugs available, Suction available and Patient being monitored Patient Re-evaluated:Patient Re-evaluated prior to induction Oxygen Delivery Method: Circle System Utilized Preoxygenation: Pre-oxygenation with 100% oxygen Induction Type: IV induction Ventilation: Mask ventilation without difficulty Laryngoscope Size: Miller and 3 Grade View: Grade I Tube type: Oral Tube size: 8.0 mm Number of attempts: 1 Airway Equipment and Method: Stylet and Oral airway Placement Confirmation: ETT inserted through vocal cords under direct vision,  positive ETCO2 and breath sounds checked- equal and bilateral Secured at: 23 cm Tube secured with: Tape Dental Injury: Teeth and Oropharynx as per pre-operative assessment

## 2018-02-23 NOTE — Progress Notes (Signed)
  Day of Surgery Note    Subjective:  No complaints   Vitals:   02/23/18 1332 02/23/18 1345  BP: (!) 114/43 (!) 120/54  Pulse: 66 67  Resp: 16 12  Temp: (!) 97.5 F (36.4 C)   SpO2: 94% 95%     Extremities:  +doppler signals bilateral AT>PT Cardiac:  regular Lungs:  Non labored    Assessment/Plan:  This is a 72 y.o. male who is s/p  Abdominal aortogram bilateral retrograde common femoral puncture, bilateral common iliac stent (8 x 41 VBX), left external iliac common femoral superficial femoral endarterectomy  -pt with + doppler signals bilateral AT>PT -diabetes-SSI for now given decreased po intake periop.  DM coordinator consulted to help manage home insulin/restarting it. -to [place] when bed available.     Leontine Locket, PA-C 02/23/2018 1:58 PM [phone]

## 2018-02-24 ENCOUNTER — Telehealth: Payer: Self-pay | Admitting: Vascular Surgery

## 2018-02-24 ENCOUNTER — Encounter (HOSPITAL_COMMUNITY): Payer: Self-pay | Admitting: Vascular Surgery

## 2018-02-24 LAB — CBC
HCT: 24 % — ABNORMAL LOW (ref 39.0–52.0)
HEMOGLOBIN: 7.5 g/dL — AB (ref 13.0–17.0)
MCH: 28.2 pg (ref 26.0–34.0)
MCHC: 31.3 g/dL (ref 30.0–36.0)
MCV: 90.2 fL (ref 78.0–100.0)
Platelets: 231 10*3/uL (ref 150–400)
RBC: 2.66 MIL/uL — ABNORMAL LOW (ref 4.22–5.81)
RDW: 13.6 % (ref 11.5–15.5)
WBC: 11.1 10*3/uL — ABNORMAL HIGH (ref 4.0–10.5)

## 2018-02-24 LAB — BASIC METABOLIC PANEL
ANION GAP: 9 (ref 5–15)
BUN: 41 mg/dL — ABNORMAL HIGH (ref 8–23)
CALCIUM: 8.4 mg/dL — AB (ref 8.9–10.3)
CHLORIDE: 104 mmol/L (ref 98–111)
CO2: 25 mmol/L (ref 22–32)
Creatinine, Ser: 2.66 mg/dL — ABNORMAL HIGH (ref 0.61–1.24)
GFR calc non Af Amer: 22 mL/min — ABNORMAL LOW (ref >60)
GFR, EST AFRICAN AMERICAN: 26 mL/min — AB (ref >60)
Glucose, Bld: 225 mg/dL — ABNORMAL HIGH (ref 70–99)
POTASSIUM: 4.4 mmol/L (ref 3.5–5.1)
Sodium: 138 mmol/L (ref 135–145)

## 2018-02-24 LAB — GLUCOSE, CAPILLARY
GLUCOSE-CAPILLARY: 276 mg/dL — AB (ref 70–99)
GLUCOSE-CAPILLARY: 264 mg/dL — AB (ref 70–99)
Glucose-Capillary: 224 mg/dL — ABNORMAL HIGH (ref 70–99)

## 2018-02-24 MED ORDER — FERROUS SULFATE 325 (65 FE) MG PO TABS: 325.0000 mg | ORAL_TABLET | Freq: Every day | ORAL | 0 refills | Status: DC

## 2018-02-24 MED ORDER — SENNA 8.6 MG PO TABS: 1.0000 | ORAL_TABLET | Freq: Every day | ORAL | 0 refills | Status: DC

## 2018-02-24 MED ORDER — OXYCODONE-ACETAMINOPHEN 5-325 MG PO TABS: 1.0000 | ORAL_TABLET | Freq: Four times a day (QID) | ORAL | 0 refills | Status: DC | PRN

## 2018-02-24 NOTE — Care Management Note (Signed)
Case Management Note Marvetta Gibbons RN, BSN Unit 4E- RN Care Coordinator  [phone]  Patient Details  Name: Lance Collier MRN: [id number] Date of Birth: Aug 31, 1945  Subjective/Objective:   Pt admitted s/p Abdominal aortogram bilateral retrograde common femoral puncture, bilateral common iliac stent (8 x 39 VBX),left external iliac common femoral superficial femoral endarterectomy                 Action/Plan: PTA pt lived at home, plan to return home, anticipate discharge today. No CM needs noted for transition home.   Expected Discharge Date:  02/24/18               Expected Discharge Plan:  Home/Self Care  In-House Referral:  NA  Discharge planning Services  CM Consult  Post Acute Care Choice:  NA Choice offered to:  NA  DME Arranged:    DME Agency:     HH Arranged:    HH Agency:     Status of Service:  Completed, signed off  If discussed at [place] of Stay Meetings, dates discussed:    Discharge Disposition: home/self care   Additional Comments:  Dawayne Patricia, RN 02/24/2018, 11:05 AM

## 2018-02-24 NOTE — Telephone Encounter (Signed)
sch appt lvm 03/18/18 3pm ABI 345pm p/o MD

## 2018-02-24 NOTE — Progress Notes (Signed)
Inpatient Diabetes Program Recommendations  AACE/ADA: New Consensus Statement on Inpatient Glycemic Control (2015)  Target Ranges:  Prepandial:   less than 140 mg/dL      Peak postprandial:   less than 180 mg/dL (1-2 hours)      Critically ill patients:  140 - 180 mg/dL   Lab Results  Component Value Date   GLUCAP 224 (H) 02/24/2018   HGBA1C 7.3 (H) 02/16/2018    Review of Glycemic Control Results for Lance Collier, Lance Collier (MRN [id number]) as of 02/24/2018 10:14  Ref. Range 02/23/2018 15:51 02/23/2018 21:05 02/24/2018 06:29  Glucose-Capillary Latest Ref Range: 70 - 99 mg/dL 216 (H) 205 (H) 224 (H)   Diabetes history: Type 2 DM Outpatient Diabetes medications: Toujeo 70 units BID, Humalog 20-28 units TID (20 units TID + 1 unit for Q15 mg/dL >150 mg/dL) Current orders for Inpatient glycemic control: Novolog 0-15 units TID  Inpatient Diabetes Program Recommendations:    DM coordinator consulted for patient in regards to post op DM control.   Of note, patient received Decadron 5 mg X1 on 02/23/18, thus anticipate BS to be increased. BS this AM look okay considering the amount for insulin received inpatient compared to home regimen. A1C is down from 9.5% to 7.3% and is managed by Dr Forde Dandy.   In the event patient to remain inpatient, consider adding back a portion of basal insulin. Consider Levemir 15 units QD and for patient to have follow up with Dr Forde Dandy for DM control.  Thanks, Bronson Curb, MSN, RNC-OB Diabetes Coordinator [phone] (8a-5p)

## 2018-02-24 NOTE — Progress Notes (Addendum)
[facility]  Subjective  - Doing well over all, he is ready to go home.   Objective (!) 144/46 77 97.8 F (36.6 C) (Oral) 17 95%  Intake/Output Summary (Last 24 hours) at 02/24/2018 0156 Last data filed at 02/24/2018 1537 Gross per 24 hour  Intake 2660.08 ml  Output 2200 ml  Net 460.08 ml    Doppler AT signals B LE, left LE edema> right Left groin incision healing well without hematoma Heart RRR Lung non labored   Assessment/Planning: POD # 1  Abdominal aortogram bilateral retrograde common femoral puncture, bilateral common iliac stent (8 x 39 VBX), left external iliac common femoral superficial femoral endarterectomy #1 severe calcific aortoiliac common femoral disease  2.  Patent but slightly narrowed left superficial femoral artery origin at conclusion of case  3.  Bovine pericardial patch extending from the distal external iliac artery about 3 cm off of the superficial femoral artery  4.  Bilateral common iliac covered balloon expandable and VBX stents 8 x 39  5: 66 cc total contrast  Cr 2.66 baseline 2.62-2.59 HGB 7.5 surgery EBL 723ml Plan to discharge home if asymptomatic with ambulation. Will start PO iron.   Dry 4 x 4 to left groin.   F/U with Dr. Oneida Alar in 2-3 weeks with ABI's.   Roxy Horseman 02/24/2018 8:08 AM --  Agree with above.  Brisk AT doppler flow bilaterally,  Left groin incision no hematoma D/c home later today if ambulatory Acute blood loss anemia asymptomatic treat with iron 4-6 weeks CKD stable creatinine  Ruta Hinds, MD [facility] [place]: [phone] Pager: [phone]   Laboratory Lab Results: Recent Labs    02/23/18 1736 02/24/18 0356  WBC 9.8 11.1*  HGB 8.5* 7.5*  HCT 26.5* 24.0*  PLT 236 231   BMET Recent Labs    02/23/18 1736 02/24/18 0356  NA  --  138  K  --  4.4  CL  --  104  CO2  --  25  GLUCOSE  --  225*  BUN  --  41*   CREATININE 2.59* 2.66*  CALCIUM  --  8.4*    COAG Lab Results  Component Value Date   INR 0.99 02/16/2018   INR 0.96 05/19/2016   INR 1.01 10/11/2014   No results found for: PTT

## 2018-02-24 NOTE — Progress Notes (Signed)
IV and telemetry discontinued. CCMD notified. Discharge instructions reviewed with patient at this time. All questions answered.   K. Starr Ronzell Laban, RN 

## 2018-02-24 NOTE — Evaluation (Signed)
Occupational Therapy Evaluation and Discharge Patient Details Name: Lance Collier MRN: [id number] DOB: February 21, 1946 Today's Date: 02/24/2018    History of Present Illness Pt is a 72 y.o. male s/p abdominal aortogram bilateral retrograde common femoral puncture, bilateral common iliac stent (8 x 71 VBX), and left external iliac common femoral superficial femoral endarterectomy. He has a PMH significant for anemia, aortic sclerosis, CAD, CHF, CKD, complication of anesthesia, diabetes mellitus type II, CERD, heart murmur, high cholesterol, hypertension, migraine, PAD, PONV, s/p CABG x3.   Clinical Impression   PTA, pt was independent with ADL and functional mobility. He currently requires overall supervision for safety during basic ADL transfers and standing ADL participation. Pt and wife educated concerning safe compensatory strategies to maximize independence and safety with ADL and functional mobility prior to returning home. They verbalize and demonstrate understanding. Pt educated concerning need to progress activity slowly post-acute D/C and he verbalizes understanding although unsure if he will adhere to this. No further acute OT needs identified and education is complete. OT will sign off acutely.     Follow Up Recommendations  No OT follow up;Supervision/Assistance - 24 hour    Equipment Recommendations  None recommended by OT    Recommendations for Other Services       Precautions / Restrictions Restrictions Weight Bearing Restrictions: No      Mobility Bed Mobility               General bed mobility comments: OOB in straight back chair on my arrival.   Transfers Overall transfer level: Needs assistance Equipment used: None Transfers: Sit to/from Stand Sit to Stand: Supervision         General transfer comment: Supervision for safety during basic sit<>stand.     Balance Overall balance assessment: Mild deficits observed, not formally tested                                          ADL either performed or assessed with clinical judgement   ADL Overall ADL's : Needs assistance/impaired Eating/Feeding: Independent   Grooming: Independent   Upper Body Bathing: Independent;Sitting   Lower Body Bathing: Supervison/ safety;Sit to/from stand Lower Body Bathing Details (indicate cue type and reason): Educated concerning use of long handled sponge to access feet for showering.  Upper Body Dressing : Independent;Sitting   Lower Body Dressing: Supervision/safety;Sit to/from stand Lower Body Dressing Details (indicate cue type and reason): increased time but determined to complete without assistance Toilet Transfer: Supervision/safety;Ambulation   Toileting- Clothing Manipulation and Hygiene: Supervision/safety;Sit to/from stand   Tub/ Shower Transfer: Supervision/safety;Ambulation;Walk-in shower   Functional mobility during ADLs: Supervision/safety General ADL Comments: Overall supervision for all ADL participation due to slight unsteadiness on his feet. Pt and wife report he is at baseline.      Vision Patient Visual Report: No change from baseline Vision Assessment?: No apparent visual deficits     Perception     Praxis      Pertinent Vitals/Pain Pain Assessment: Faces Faces Pain Scale: Hurts little more Pain Location: at incisions Pain Descriptors / Indicators: Aching;Sore Pain Intervention(s): Limited activity within patient's tolerance;Monitored during session;Repositioned     Hand Dominance     Extremity/Trunk Assessment Upper Extremity Assessment Upper Extremity Assessment: Overall WFL for tasks assessed   Lower Extremity Assessment Lower Extremity Assessment: Defer to PT evaluation       Communication Communication Communication: No  difficulties   Cognition Arousal/Alertness: Awake/alert Behavior During Therapy: Flat affect Overall Cognitive Status: Within Functional Limits for tasks assessed                                  General Comments: Flat affect and in a hurry to leave in order to go to his card game tonight.    General Comments  Pt's wife present and engaged in session.     Exercises     Shoulder Instructions      Home Living Family/patient expects to be discharged to:: Private residence Living Arrangements: Spouse/significant other Available Help at Discharge: Family;Available 24 hours/day Type of Home: House Home Access: Stairs to enter [employer] of Steps: 3 Entrance Stairs-Rails: Right;Left Home Layout: Multi-level;Bed/bath upstairs;Able to live on main level with bedroom/bathroom;Full bath on main level Alternate Level Stairs-Number of Steps: 14 Alternate Level Stairs-Rails: Left;Right           Home Equipment: Cane - single point;Walker - 2 wheels;Bedside commode          Prior Functioning/Environment Level of Independence: Independent        Comments: Independent with ADL and mobility. Working as a Chief Executive Officer.         OT Problem List: Decreased activity tolerance;Impaired balance (sitting and/or standing);Decreased safety awareness;Decreased knowledge of use of DME or AE;Pain      OT Treatment/Interventions:      OT Goals(Current goals can be found in the care plan section) Acute Rehab OT Goals Patient Stated Goal: to get to his card game at the club tonight OT Goal Formulation: With patient/family Time For Goal Achievement: 03/10/18 Potential to Achieve Goals: Good  OT Frequency:     Barriers to D/C:            Co-evaluation              AM-PAC PT "6 Clicks" Daily Activity     Outcome Measure Help from another person eating meals?: None Help from another person taking care of personal grooming?: None Help from another person toileting, which includes using toliet, bedpan, or urinal?: None Help from another person bathing (including washing, rinsing, drying)?: None Help from another person to put on and  taking off regular upper body clothing?: None Help from another person to put on and taking off regular lower body clothing?: None 6 Click Score: 24   End of Session Equipment Utilized During Treatment: Gait belt;Rolling walker  Activity Tolerance: Patient tolerated treatment well Patient left: in chair;with call bell/phone within reach;with family/visitor present  OT Visit Diagnosis: Other abnormalities of gait and mobility (R26.89)                Time: 1004-1016 OT Time Calculation (min): 12 min Charges:  OT General Charges $OT Visit: 1 Visit OT Evaluation $OT Eval Low Complexity: Ehrhardt Stefano Trulson, OTR/L [place] Pager [phone] Office [place] A Oneal Schoenberger 02/24/2018, 11:42 AM

## 2018-02-24 NOTE — Evaluation (Signed)
Physical Therapy Evaluation and D/C Patient Details Name: Lance Collier MRN: [id number] DOB: 09-Oct-1945 Today's Date: 02/24/2018   History of Present Illness  Pt is a 72 y.o. male s/p abdominal aortogram bilateral retrograde common femoral puncture, bilateral common iliac stent (8 x 67 VBX), and left external iliac common femoral superficial femoral endarterectomy. He has a PMH significant for anemia, aortic sclerosis, CAD, CHF, CKD, complication of anesthesia, diabetes mellitus type II, CERD, heart murmur, high cholesterol, hypertension, migraine, PAD, PONV, s/p CABG x3.  Clinical Impression  Pt admitted with above diagnosis. Pt currently without significant functional limitations and does not need any further PT.  Will sign off.  VSS.   Follow Up Recommendations No PT follow up    Equipment Recommendations  None recommended by PT    Recommendations for Other Services       Precautions / Restrictions Precautions Precautions: None Restrictions Weight Bearing Restrictions: No      Mobility  Bed Mobility               General bed mobility comments: OOB in straight back chair on my arrival.   Transfers Overall transfer level: Needs assistance Equipment used: None Transfers: Sit to/from Stand Sit to Stand: Independent         General transfer comment: Supervision for safety during basic sit<>stand.   Ambulation/Gait Ambulation/Gait assistance: Supervision Gait Distance (Feet): 400 Feet Assistive device: None Gait Pattern/deviations: Antalgic   Gait velocity interpretation: >2.62 ft/sec, indicative of community ambulatory General Gait Details: Pt with overall good cadence.  At times, antalgic but does not slow pt down.  No LOB with challenges.   Stairs Stairs: Yes Stairs assistance: Supervision Stair Management: One rail Right;One rail Left;Alternating pattern;Step to pattern;Forwards Number of Stairs: 18 General stair comments: No difficulties.  can do step  to and alternating.    Wheelchair Mobility    Modified Rankin (Stroke Patients Only)       Balance Overall balance assessment: Independent                               Standardized Balance Assessment Standardized Balance Assessment : Dynamic Gait Index   Dynamic Gait Index Level Surface: Normal Change in Gait Speed: Normal Gait with Horizontal Head Turns: Normal Gait with Vertical Head Turns: Normal Gait and Pivot Turn: Normal Step Over Obstacle: Normal Step Around Obstacles: Normal Steps: Mild Impairment Total Score: 23       Pertinent Vitals/Pain Pain Assessment: Faces Faces Pain Scale: Hurts little more Pain Location: at incisions Pain Descriptors / Indicators: Aching;Sore Pain Intervention(s): Limited activity within patient's tolerance;Monitored during session;Repositioned    Home Living Family/patient expects to be discharged to:: Private residence Living Arrangements: Spouse/significant other Available Help at Discharge: Family;Available 24 hours/day Type of Home: House Home Access: Stairs to enter Entrance Stairs-Rails: Psychiatric nurse of Steps: 3 Home Layout: Multi-level;Bed/bath upstairs;Able to live on main level with bedroom/bathroom;Full bath on main level Home Equipment: Cane - single point;Walker - 2 wheels;Bedside commode      Prior Function Level of Independence: Independent         Comments: Independent with ADL and mobility. Working as a Chief Executive Officer.      Hand Dominance        Extremity/Trunk Assessment   Upper Extremity Assessment Upper Extremity Assessment: Defer to OT evaluation    Lower Extremity Assessment Lower Extremity Assessment: Overall WFL for tasks assessed    Cervical /  Trunk Assessment Cervical / Trunk Assessment: Normal  Communication   Communication: No difficulties  Cognition Arousal/Alertness: Awake/alert Behavior During Therapy: Flat affect Overall Cognitive Status: Within  Functional Limits for tasks assessed                                 General Comments: Flat affect and in a hurry to leave in order to go to his card game tonight.       General Comments General comments (skin integrity, edema, etc.): wife present    Exercises     Assessment/Plan    PT Assessment Patent does not need any further PT services  PT Problem List         PT Treatment Interventions      PT Goals (Current goals can be found in the Care Plan section)  Acute Rehab PT Goals Patient Stated Goal: to get to his card game at the club tonight PT Goal Formulation: All assessment and education complete, DC therapy    Frequency     Barriers to discharge        Co-evaluation               AM-PAC PT "6 Clicks" Daily Activity  Outcome Measure Difficulty turning over in bed (including adjusting bedclothes, sheets and blankets)?: None Difficulty moving from lying on back to sitting on the side of the bed? : None Difficulty sitting down on and standing up from a chair with arms (e.g., wheelchair, bedside commode, etc,.)?: None Help needed moving to and from a bed to chair (including a wheelchair)?: None Help needed walking in hospital room?: None Help needed climbing 3-5 steps with a railing? : None 6 Click Score: 24    End of Session Equipment Utilized During Treatment: Gait belt Activity Tolerance: Patient tolerated treatment well Patient left: in chair;with call bell/phone within reach;with family/visitor present Nurse Communication: Mobility status PT Visit Diagnosis: Muscle weakness (generalized) (M62.81)    Time: 6283-1517 PT Time Calculation (min) (ACUTE ONLY): 12 min   Charges:   PT Evaluation $PT Eval Low Complexity: [place] Susanne Baumgarner,PT Acute Rehabilitation Services Pager:  [phone]  Office:  [place] 02/24/2018, 1:54 PM

## 2018-02-24 NOTE — Progress Notes (Signed)
Patient up to chair at this time. Patient ambulated and tolerated well.

## 2018-02-26 ENCOUNTER — Other Ambulatory Visit: Payer: Self-pay

## 2018-02-26 DIAGNOSIS — I739 Peripheral vascular disease, unspecified: Secondary | ICD-10-CM

## 2018-02-26 NOTE — Discharge Summary (Signed)
[facility] Discharge Summary   Patient ID:  Lance Collier MRN: [id number] DOB/AGE: 74-21-1947 74 y.o.  Admit date: 02/23/2018 Discharge date:02/24/2018 Date of Surgery: 02/23/2018 Surgeon: Surgeon(s): Fields, Jessy Oto, MD Serafina Mitchell, MD  Admission Diagnosis: PERIPHERAL ARTERY DISEASE  Discharge Diagnoses:  PERIPHERAL ARTERY DISEASE  Secondary Diagnoses: Past Medical History:  Diagnosis Date  . Anemia   . Aortic sclerosis  - without Stenosis  March 2014   Echo: Aortic sclerosis without stenosis. EF 55-60% with normal WM; mild concentric hypertrophy with normal relaxation.  . Arthritis   . CAD (coronary artery disease), autologous vein bypass graft 09/2014   10/03/14: Cath for Class III-IV Angina -- 80% ostial-proximal SVG-RPDA (minimal flow down native PDA from native RCA with 50% proximal and distal, patent LIMA-LAD, SVG-OM with retrograde filling of OM 2. --> 10/12/14: Staged PCI of SVG-RPDA - Xience DES  3.0 mm x 38 mm; (3.4  - 3.3 mm)  . CAD S/P percutaneous coronary angioplasty 06/12/2011; 09/2104   a) 2012: Complex PCI mRCA ([place]) --  2 overlapping Promus element DES stents.; 09/2014:  ost-prox SVG-rPDA - Xience DES 3.0 mm x 38 mm; (3.4  - 3.3 mm)  . CHF (congestive heart failure) ([place])   . Childhood asthma   . CKD (chronic kidney disease), stage III ([place])    "mild; related to diabetes" (10/02/2014)  . Complication of anesthesia    "I've had name recall recognition problems since my last anesthesia"  . DM (diabetes mellitus) type II uncontrolled with eye manifestation ([place]) 2013   Diabetic retinopathy with retinal hemorrhages since;, recurrent in August 2014 treated with Avastin shots  . Dyspnea    mainly with exertion  . GERD (gastroesophageal reflux disease)   . Heart murmur    questionable  . High cholesterol   . Hypertension   . Migraine ~ 2004   "once"  . PAD (peripheral artery disease) ([facility])    with claudication  . PONV (postoperative  nausea and vomiting)    s/p tonsillectomy  . 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 07/01/2013 (Cath for Crescendo Unstable Angina) --> [name]: For LM disease; LIMA-LAD, SVG-RCA, SCG-Cx; b. 09/2014 Cath/Staged PCI: patent LIMA->LAD, VG->OM1/RI, VG->RCA 80% (3.0x38 Xience Alpine DES on 10/12/2014).  . 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  Likely thromboembolic event with PDA occlusion. 06/12/2011   Post Complex PCI, pt states no mi  . Wears glasses    reading    Procedure(s): LEFT COMMON FEMORAL ENDARTERECTOMY INSERTION OF BILATERAL COMMON ILIAC STENTS WITH VIABAHN STENTS PATCH ANGIOPLASTY LEFT COMMON FEMORAL ARTERY  Discharged Condition: good  HPI: Lance Collier is a 72 y.o. male who  has debilitating lower extremity claudication walking minimal distance. He denies rest pain.  He underwent an angiogram by Dr. Oneida Alar on 02/05/2018.  This showed 80% stenosis left common iliac origin, 60% stenosis right common iliac origin,  Short segment occlusion left superficial femoral artery, Subtotal occlusion distal left common femoral artery, Diffuse multisegment greater than 80% stenosis right popliteal artery extending from the abductor hiatus to the origin of the tibial vessels, Three-vessel runoff right foot, and Two-vessel runoff left foot peroneal anterior tibial.    Based on the angiogram he was scheduled for left femoral endarterectomy with bilateral iliac stents.    Hospital Course:  Lance Collier is a 72 y.o. male is S/P  Procedure(s): LEFT COMMON FEMORAL ENDARTERECTOMY INSERTION OF BILATERAL COMMON ILIAC STENTS  WITH VIABAHN STENTS PATCH ANGIOPLASTY LEFT COMMON FEMORAL ARTERY   Doppler AT signals B LE, left LE edema> right Cr at baseline Stable disposition for discharge.  Repeat ABI's in 2-3 weeks at his f/u visit with DR. Fields.  Significant Diagnostic Studies: CBC Lab Results  Component Value Date   WBC 11.1 (H) 02/24/2018   HGB  7.5 (L) 02/24/2018   HCT 24.0 (L) 02/24/2018   MCV 90.2 02/24/2018   PLT 231 02/24/2018    BMET    Component Value Date/Time   NA 138 02/24/2018 0356   K 4.4 02/24/2018 0356   CL 104 02/24/2018 0356   CO2 25 02/24/2018 0356   GLUCOSE 225 (H) 02/24/2018 0356   BUN 41 (H) 02/24/2018 0356   CREATININE 2.66 (H) 02/24/2018 0356   CREATININE 1.72 (H) 11/03/2014 0958   CALCIUM 8.4 (L) 02/24/2018 0356   GFRNONAA 22 (L) 02/24/2018 0356   GFRNONAA 39 (L) 07/20/2013 1120   GFRAA 26 (L) 02/24/2018 0356   GFRAA 46 (L) 07/20/2013 1120   COAG Lab Results  Component Value Date   INR 0.99 02/16/2018   INR 0.96 05/19/2016   INR 1.01 10/11/2014     Disposition:  Discharge to :Home Discharge Instructions    Call MD for:  redness, tenderness, or signs of infection (pain, swelling, bleeding, redness, odor or green/yellow discharge around incision site)   Complete by:  As directed    Call MD for:  severe or increased pain, loss or decreased feeling  in affected limb(s)   Complete by:  As directed    Call MD for:  temperature >100.5   Complete by:  As directed    Resume previous diet   Complete by:  As directed      Allergies as of 02/24/2018      Reactions   Atorvastatin Other (See Comments)   Leg pain   Ibuprofen Hives   Pravastatin Other (See Comments)   Leg pain   Simvastatin Other (See Comments)   Leg pain      Medication List    TAKE these medications   acetaminophen 325 MG tablet Commonly known as:  TYLENOL Take 650 mg by mouth daily as needed for mild pain or headache.   amLODipine 5 MG tablet Commonly known as:  NORVASC Take 5 mg by mouth daily.   aspirin EC 81 MG tablet Take 1 tablet (81 mg total) by mouth every other day. What changed:    when to take this  additional instructions   carvedilol 12.5 MG tablet Commonly known as:  COREG TAKE 1 TABLET BY MOUTH TWICE DAILY.   clopidogrel 75 MG tablet Commonly known as:  PLAVIX TAKE 1 TABLET DAILY WITH  BREAKFAST.   Evolocumab 140 MG/ML Soaj Inject 140 mg into the skin every 14 (fourteen) days.   fenofibrate 160 MG tablet Take 1 tablet (160 mg total) by mouth daily.   ferrous sulfate 325 (65 FE) MG tablet Take 1 tablet (325 mg total) by mouth daily.   ferrous sulfate 325 (65 FE) MG tablet Take 1 tablet (325 mg total) by mouth daily.   furosemide 40 MG tablet Commonly known as:  LASIX Take 120 mg in the morning and 80 mg in the evening  May take an extra 40 mg if needed for 3 lb increase What changed:    how much to take  how to take this  when to take this   HUMALOG KWIKPEN 200 UNIT/ML Sopn Generic drug:  Insulin  Lispro Inject 20-28 Units into the skin See admin instructions. Use 20 units (base) per meal increase units given by 1 unit for every 15 increment over 150 (I.E. Blood glucose reading 165=21 units, 180=22 units, etc. )   hydrALAZINE 100 MG tablet Commonly known as:  APRESOLINE TAKE 1 TABLET BY MOUTH 3 TIMES A DAY.   isosorbide mononitrate 60 MG 24 hr tablet Commonly known as:  IMDUR TAKE 1 TABLET ONCE DAILY.   neomycin-bacitracin-polymyxin ointment Commonly known as:  NEOSPORIN Apply 1 application topically 3 (three) times daily as needed for wound care.   NITROSTAT 0.4 MG SL tablet Generic drug:  nitroGLYCERIN DISSOLVE 1 TABLET UNDER TONGUE AS NEEDED FOR CHEST PAIN,MAY REPEAT IN5 MINUTES FOR 2 DOSES.   oxyCODONE-acetaminophen 5-325 MG tablet Commonly known as:  PERCOCET/ROXICET Take 1 tablet by mouth every 6 (six) hours as needed for moderate pain.   pantoprazole 40 MG tablet Commonly known as:  PROTONIX Take 1 tablet (40 mg total) by mouth daily.   rosuvastatin 10 MG tablet Commonly known as:  CRESTOR TAKE 10 MG Tuesday, Thursday,SATURDAY,SUNDAY What changed:    how much to take  how to take this  when to take this  additional instructions   senna 8.6 MG Tabs tablet Commonly known as:  SENOKOT Take 1 tablet (8.6 mg total) by mouth  daily.   STOOL SOFTENER 100 MG capsule Generic drug:  docusate sodium Take 100 mg by mouth daily as needed for mild constipation.   TOUJEO MAX SOLOSTAR 300 UNIT/ML Sopn Generic drug:  Insulin Glargine Inject 70 Units into the skin 2 (two) times daily.   VASCEPA 1 g Caps Generic drug:  Icosapent Ethyl Take 2 g by mouth 2 (two) times daily.      Verbal and written Discharge instructions given to the patient. Wound care per Discharge AVS Follow-up Information    Elam Dutch, MD In 2 weeks.   Specialties:  Vascular Surgery, Cardiology Why:  Office will call you to arrange your appt (sent) Contact information: [address] [place] [address] [phone]           Signed: Roxy Horseman 02/26/2018, 1:33 PM

## 2018-03-01 ENCOUNTER — Other Ambulatory Visit: Payer: Self-pay

## 2018-03-01 ENCOUNTER — Telehealth: Payer: Self-pay | Admitting: Emergency Medicine

## 2018-03-01 ENCOUNTER — Encounter (HOSPITAL_COMMUNITY): Payer: Self-pay | Admitting: *Deleted

## 2018-03-01 ENCOUNTER — Observation Stay (HOSPITAL_COMMUNITY)
Admission: EM | Admit: 2018-03-01 | Discharge: 2018-03-02 | Disposition: A | Discharge status: Home / Self Care | Payer: Medicare Other | Attending: Vascular Surgery | Admitting: Vascular Surgery

## 2018-03-01 DIAGNOSIS — E1122 Type 2 diabetes mellitus with diabetic chronic kidney disease: Secondary | ICD-10-CM | POA: Diagnosis not present

## 2018-03-01 DIAGNOSIS — D62 Acute posthemorrhagic anemia: Principal | ICD-10-CM | POA: Insufficient documentation

## 2018-03-01 DIAGNOSIS — E1151 Type 2 diabetes mellitus with diabetic peripheral angiopathy without gangrene: Secondary | ICD-10-CM | POA: Insufficient documentation

## 2018-03-01 DIAGNOSIS — I2510 Atherosclerotic heart disease of native coronary artery without angina pectoris: Secondary | ICD-10-CM | POA: Insufficient documentation

## 2018-03-01 DIAGNOSIS — Z951 Presence of aortocoronary bypass graft: Secondary | ICD-10-CM | POA: Diagnosis not present

## 2018-03-01 DIAGNOSIS — N184 Chronic kidney disease, stage 4 (severe): Secondary | ICD-10-CM | POA: Diagnosis not present

## 2018-03-01 DIAGNOSIS — R946 Abnormal results of thyroid function studies: Secondary | ICD-10-CM | POA: Diagnosis not present

## 2018-03-01 DIAGNOSIS — I7389 Other specified peripheral vascular diseases: Secondary | ICD-10-CM | POA: Diagnosis not present

## 2018-03-01 DIAGNOSIS — I130 Hypertensive heart and chronic kidney disease with heart failure and stage 1 through stage 4 chronic kidney disease, or unspecified chronic kidney disease: Secondary | ICD-10-CM | POA: Diagnosis not present

## 2018-03-01 DIAGNOSIS — N183 Chronic kidney disease, stage 3 (moderate): Secondary | ICD-10-CM | POA: Diagnosis not present

## 2018-03-01 DIAGNOSIS — E1139 Type 2 diabetes mellitus with other diabetic ophthalmic complication: Secondary | ICD-10-CM | POA: Diagnosis not present

## 2018-03-01 DIAGNOSIS — I25810 Atherosclerosis of coronary artery bypass graft(s) without angina pectoris: Secondary | ICD-10-CM | POA: Diagnosis not present

## 2018-03-01 DIAGNOSIS — R0602 Shortness of breath: Secondary | ICD-10-CM | POA: Insufficient documentation

## 2018-03-01 DIAGNOSIS — I509 Heart failure, unspecified: Secondary | ICD-10-CM | POA: Insufficient documentation

## 2018-03-01 DIAGNOSIS — Z87891 Personal history of nicotine dependence: Secondary | ICD-10-CM | POA: Insufficient documentation

## 2018-03-01 DIAGNOSIS — E1142 Type 2 diabetes mellitus with diabetic polyneuropathy: Secondary | ICD-10-CM | POA: Diagnosis not present

## 2018-03-01 DIAGNOSIS — E211 Secondary hyperparathyroidism, not elsewhere classified: Secondary | ICD-10-CM | POA: Diagnosis not present

## 2018-03-01 DIAGNOSIS — I10 Essential (primary) hypertension: Secondary | ICD-10-CM | POA: Diagnosis not present

## 2018-03-01 DIAGNOSIS — D649 Anemia, unspecified: Secondary | ICD-10-CM | POA: Diagnosis present

## 2018-03-01 DIAGNOSIS — Z6830 Body mass index (BMI) 30.0-30.9, adult: Secondary | ICD-10-CM | POA: Diagnosis not present

## 2018-03-01 DIAGNOSIS — D500 Iron deficiency anemia secondary to blood loss (chronic): Secondary | ICD-10-CM | POA: Diagnosis not present

## 2018-03-01 DIAGNOSIS — E113593 Type 2 diabetes mellitus with proliferative diabetic retinopathy without macular edema, bilateral: Secondary | ICD-10-CM | POA: Diagnosis not present

## 2018-03-01 DIAGNOSIS — E7849 Other hyperlipidemia: Secondary | ICD-10-CM | POA: Diagnosis not present

## 2018-03-01 LAB — COMPREHENSIVE METABOLIC PANEL
ALK PHOS: 47 U/L (ref 38–126)
ALT: 18 U/L (ref 0–44)
AST: 28 U/L (ref 15–41)
Albumin: 3.5 g/dL (ref 3.5–5.0)
Anion gap: 12 (ref 5–15)
BILIRUBIN TOTAL: 0.9 mg/dL (ref 0.3–1.2)
BUN: 40 mg/dL — AB (ref 8–23)
CALCIUM: 9 mg/dL (ref 8.9–10.3)
CHLORIDE: 96 mmol/L — AB (ref 98–111)
CO2: 27 mmol/L (ref 22–32)
CREATININE: 2.78 mg/dL — AB (ref 0.61–1.24)
GFR calc Af Amer: 24 mL/min — ABNORMAL LOW (ref >60)
GFR, EST NON AFRICAN AMERICAN: 21 mL/min — AB (ref >60)
Glucose, Bld: 231 mg/dL — ABNORMAL HIGH (ref 70–99)
Potassium: 4 mmol/L (ref 3.5–5.1)
Sodium: 135 mmol/L (ref 135–145)
TOTAL PROTEIN: 6.4 g/dL — AB (ref 6.5–8.1)

## 2018-03-01 LAB — CBC
HCT: 22 % — ABNORMAL LOW (ref 39.0–52.0)
Hemoglobin: 6.9 g/dL — CL (ref 13.0–17.0)
MCH: 29 pg (ref 26.0–34.0)
MCHC: 31.4 g/dL (ref 30.0–36.0)
MCV: 92.4 fL (ref 78.0–100.0)
PLATELETS: 266 10*3/uL (ref 150–400)
RBC: 2.38 MIL/uL — ABNORMAL LOW (ref 4.22–5.81)
RDW: 14.6 % (ref 11.5–15.5)
WBC: 7.6 10*3/uL (ref 4.0–10.5)

## 2018-03-01 LAB — TYPE AND SCREEN
ABO/RH(D): B POS
ANTIBODY SCREEN: NEGATIVE

## 2018-03-01 LAB — GLUCOSE, CAPILLARY: Glucose-Capillary: 150 mg/dL — ABNORMAL HIGH (ref 70–99)

## 2018-03-01 LAB — PREPARE RBC (CROSSMATCH)

## 2018-03-01 MED ORDER — SODIUM CHLORIDE 0.9 % IV SOLN: INTRAVENOUS | Status: DC

## 2018-03-01 MED ORDER — METOPROLOL TARTRATE 5 MG/5ML IV SOLN
2.0000 mg | INTRAVENOUS | Status: DC | PRN
  Filled 2018-03-01: qty 5

## 2018-03-01 MED ORDER — HYDRALAZINE HCL 20 MG/ML IJ SOLN: 5.0000 mg | INTRAMUSCULAR | Status: DC | PRN

## 2018-03-01 MED ORDER — SODIUM CHLORIDE 0.9% IV SOLUTION: Freq: Once | INTRAVENOUS | Status: DC

## 2018-03-01 MED ORDER — ACETAMINOPHEN 500 MG PO TABS
1000.0000 mg | ORAL_TABLET | Freq: Four times a day (QID) | ORAL | Status: DC | PRN
  Administered 2018-03-01: 1000 mg via ORAL
  Filled 2018-03-01: qty 2

## 2018-03-01 MED ORDER — GUAIFENESIN-DM 100-10 MG/5ML PO SYRP: 15.0000 mL | ORAL_SOLUTION | ORAL | Status: DC | PRN

## 2018-03-01 MED ORDER — PANTOPRAZOLE SODIUM 40 MG PO TBEC
40.0000 mg | DELAYED_RELEASE_TABLET | Freq: Every day | ORAL | Status: DC
  Filled 2018-03-01: qty 1

## 2018-03-01 MED ORDER — ONDANSETRON HCL 4 MG/2ML IJ SOLN: 4.0000 mg | Freq: Four times a day (QID) | INTRAMUSCULAR | Status: DC | PRN

## 2018-03-01 MED ORDER — LABETALOL HCL 5 MG/ML IV SOLN: 10.0000 mg | INTRAVENOUS | Status: DC | PRN

## 2018-03-01 MED ORDER — ALUM & MAG HYDROXIDE-SIMETH 200-200-20 MG/5ML PO SUSP: 15.0000 mL | ORAL | Status: DC | PRN

## 2018-03-01 NOTE — ED Notes (Signed)
Pt seen getting down onto floor in the entrance of lobby and covering himself up with the blanket. Stating he is more comfortable laying down and states "we haven't don't a damn thing for him and that he can't take it anymore" Explained to pt that he can't lay in the floor. Pt yelled "why not I'm going to sue this hospital this is absolutely ridiculous" Explained to pt that it's a busy day and that he could lay down on one fo the benches in the lobby while he waits. Pt put in wheelchair and placed in lobby.

## 2018-03-01 NOTE — ED Notes (Signed)
Pt walked outside and stated "im going to [facility]. i'm gonna die here."

## 2018-03-01 NOTE — H&P (Signed)
Patient name: Lance Collier MRN: [id number] DOB: 1946-06-05 Sex: male  HPI: Lance Collier is a 72 y.o. male,  S/p common iliac stents and left femoral endarterectomy 1 week ago. He presented to his primary MD today and was found to be anemic.  His Hgb on d/c was 7.5.  He was 6.9 in the ER today.  He is on iron.   Preop hemoglobin was 11.2.  He reports no incisional drainage.  He denies fever or chills.  He denies shortness of breath or chest pain.  He is just lethargic with no exercise reserve.  Past Medical History:  Diagnosis Date  . Anemia   . Aortic sclerosis  - without Stenosis  March 2014   Echo: Aortic sclerosis without stenosis. EF 55-60% with normal WM; mild concentric hypertrophy with normal relaxation.  . Arthritis   . CAD (coronary artery disease), autologous vein bypass graft 09/2014   10/03/14: Cath for Class III-IV Angina -- 80% ostial-proximal SVG-RPDA (minimal flow down native PDA from native RCA with 50% proximal and distal, patent LIMA-LAD, SVG-OM with retrograde filling of OM 2. --> 10/12/14: Staged PCI of SVG-RPDA - Xience DES  3.0 mm x 38 mm; (3.4  - 3.3 mm)  . CAD S/P percutaneous coronary angioplasty 06/12/2011; 09/2104   a) 2012: Complex PCI mRCA ([place]) --  2 overlapping Promus element DES stents.; 09/2014:  ost-prox SVG-rPDA - Xience DES 3.0 mm x 38 mm; (3.4  - 3.3 mm)  . CHF (congestive heart failure) ([place])   . Childhood asthma   . CKD (chronic kidney disease), stage III ([place])    "mild; related to diabetes" (10/02/2014)  . Complication of anesthesia    "I've had name recall recognition problems since my last anesthesia"  . DM (diabetes mellitus) type II uncontrolled with eye manifestation ([place]) 2013   Diabetic retinopathy with retinal hemorrhages since;, recurrent in August 2014 treated with Avastin shots  . Dyspnea    mainly with exertion  . GERD (gastroesophageal reflux disease)   . Heart murmur    questionable  . High cholesterol   . Hypertension     . Migraine ~ 2004   "once"  . PAD (peripheral artery disease) ([facility])    with claudication  . PONV (postoperative nausea and vomiting)    s/p tonsillectomy  . 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 07/01/2013 (Cath for [place] Unstable Angina) --> [name]: For LM disease; LIMA-LAD, SVG-RCA, SCG-Cx; b. 09/2014 Cath/Staged PCI: patent LIMA->LAD, VG->OM1/RI, VG->RCA 80% (3.0x38 Xience Alpine DES on 10/12/2014).  . 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  Likely thromboembolic event with PDA occlusion. 06/12/2011   Post Complex PCI, pt states no mi  . Wears glasses    reading   Past Surgical History:  Procedure Laterality Date  . ABDOMINAL AORTOGRAM W/LOWER EXTREMITY N/A 02/05/2018   Procedure: ABDOMINAL AORTOGRAM W/LOWER EXTREMITY;  Surgeon: Elam Dutch, MD;  Location: [place];  Service: Cardiovascular;  Laterality: N/A;  . CARDIAC CATHETERIZATION  06/28/2013   ostial LM disese, RCA ~40-50%ISR  . CAROTID ENDARTERECTOMY Left 05/21/2016  . COLONOSCOPY    . CORONARY ANGIOPLASTY WITH STENT PLACEMENT Right September 2012   2 overlapping Promus DES 2.7 mm at 12 mm x2; postdilated to 3 mm  . CORONARY ARTERY BYPASS GRAFT N/A 07/01/2013   Procedure: CORONARY ARTERY BYPASS GRAFTING (CABG);  Surgeon: Grace Isaac, MD;  Location: [facility];  Service:  Open Heart Surgery;  Laterality: N/A;  Times 3 using left internal mammary artery and endoscopically harvested bilateral saphenous vein  . ENDARTERECTOMY Left 05/21/2016   Procedure: LEFT CAROTID ENDARTERECTOMY;  Surgeon: Elam Dutch, MD;  Location: [facility];  Service: Vascular;  Laterality: Left;  . ENDARTERECTOMY FEMORAL Left 02/23/2018   Procedure: LEFT COMMON FEMORAL ENDARTERECTOMY;  Surgeon: Elam Dutch, MD;  Location: [facility];  Service: Vascular;  Laterality: Left;  . EYE SURGERY Bilateral    ioc for cataracts  . INSERTION OF ILIAC STENT Bilateral 02/23/2018   Procedure: INSERTION OF  BILATERAL COMMON ILIAC STENTS WITH VIABAHN STENTS;  Surgeon: Elam Dutch, MD;  Location: [facility];  Service: Vascular;  Laterality: Bilateral;  . INTRAOPERATIVE TRANSESOPHAGEAL ECHOCARDIOGRAM N/A 07/01/2013   Procedure: INTRAOPERATIVE TRANSESOPHAGEAL ECHOCARDIOGRAM;  Surgeon: Grace Isaac, MD;  Location: [facility];  Service: Open Heart Surgery;  Laterality: N/A;  . LEFT AND RIGHT HEART CATHETERIZATION WITH CORONARY/GRAFT ANGIOGRAM N/A 10/03/2014   Procedure: LEFT AND RIGHT HEART CATHETERIZATION WITH Beatrix Fetters;  Surgeon: Leonie Man, MD;  [facility];  Service: Cardiovascular;  Laterality: N/A;  . LEFT HEART CATHETERIZATION WITH CORONARY ANGIOGRAM N/A 06/11/2011   Procedure: LEFT HEART CATHETERIZATION WITH CORONARY ANGIOGRAM;  Surgeon: Fulton Reek, MD;  Location[facility];  Service: Cardiovascular;  Laterality: N/A;  . LEFT HEART CATHETERIZATION WITH CORONARY ANGIOGRAM N/A 06/28/2013   Procedure: LEFT HEART CATHETERIZATION WITH CORONARY ANGIOGRAM;  Surgeon: Leonie Man, MD;  Location: [facility] CATH LAB;  Service: Cardiovascular;  Laterality: N/A;  . NM MYOVIEW LTD  January '13   Diaphragmatic attenuation versus inferior infarct. No ischemia. Normal EF normal wall motion.  Marland Kitchen NM MYOVIEW LTD  June 2014   no ischemia  . NM MYOVIEW LTD  10/2014   LEXISCAN: Normal EF, 61%, No ischemia or infarction.  Mild diaphragmatic attenuation  . PATCH ANGIOPLASTY Left 05/21/2016   Procedure: PATCH ANGIOPLASTY USING HEMASHIELD PLATINUM FINESSE 0.3in x 3 in;  Surgeon: Elam Dutch, MD;  Location: [place];  Service: Vascular;  Laterality: Left;  . PATCH ANGIOPLASTY Left 02/23/2018   Procedure: PATCH ANGIOPLASTY LEFT COMMON FEMORAL ARTERY;  Surgeon: Elam Dutch, MD;  Location: [place];  Service: Vascular;  Laterality: Left;  . PERCUTANEOUS CORONARY STENT INTERVENTION (PCI-S)  06/11/2011   Procedure: PERCUTANEOUS CORONARY STENT INTERVENTION (PCI-S);  Surgeon: Fulton Reek, MD;   Location: [facility] CATH LAB;  Service: Cardiovascular;;  . PERCUTANEOUS CORONARY STENT INTERVENTION (PCI-S) N/A 10/12/2014   Procedure: PERCUTANEOUS CORONARY STENT INTERVENTION (PCI-S);  Surgeon: Leonie Man, MD;  Location: [facility] CATH LAB;  Ostial-Prox SVG-RCA Xience Alpine DES 3.0 mm x 38 mm; (3.4  - 3.3 mm)  . RETINAL LASER PROCEDURE Bilateral    "more than once"   . TONSILLECTOMY  as child  . TRANSTHORACIC ECHOCARDIOGRAM  03/2017    EF 55-60%.  Normal wall thickness.  No regional wall motion normalities.  GR 2 DD.  Mild-moderate calcified aortic valve.  Mild RV systolic dysfunction.  Moderate RV dilation.  Moderately increased PA pressures.  Domingo Dimes ECHOCARDIOGRAM  April 2016   Mod focal basal & mild overall LVH. EF 55-60%. Mild Ao Sclerosis, Mild MR with MAC. Only minimally elevated RVP.    Family History  Problem Relation Age of Onset  . Lung cancer Sister   . Breast cancer Sister   . Breast cancer Mother   . Hyperlipidemia Father   . Hypertension Father   . Throat cancer Father   .  Colon polyps Father   . Liver cancer Maternal Grandmother   . Stomach cancer Neg Hx     SOCIAL HISTORY: Social History   Socioeconomic History  . Marital status: Married    Spouse name: Not on file  . Number of children: Not on file  . Years of education: Not on file  . Highest education level: Not on file  Occupational History  . Occupation: attorney  Social Needs  . Financial resource strain: Not on file  . Food insecurity:    Worry: Not on file    Inability: Not on file  . Transportation needs:    Medical: Not on file    Non-medical: Not on file  Tobacco Use  . Smoking status: Former Smoker    Packs/day: 2.00    Years: 27.00    Pack years: 54.00    Types: Cigarettes    Last attempt to quit: 11/23/1996    Years since quitting: 21.2  . Smokeless tobacco: Never Used  Substance and Sexual Activity  . Alcohol use: Yes    Comment: rarely  . Drug use: No  . Sexual activity: Not  Currently  Lifestyle  . Physical activity:    Days per week: Not on file    Minutes per session: Not on file  . Stress: Not on file  Relationships  . Social connections:    Talks on phone: Not on file    Gets together: Not on file    Attends religious service: Not on file    Active member of club or organization: Not on file    Attends meetings of clubs or organizations: Not on file    Relationship status: Not on file  . Intimate partner violence:    Fear of current or ex partner: Not on file    Emotionally abused: Not on file    Physically abused: Not on file    Forced sexual activity: Not on file  Other Topics Concern  . 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 He notably has not been exercising like he used to. He does walk [place] and [place]. He has social alcoholic beverages but is trying to cut that back and does not smoke.       He tells me he is a sucker for chips but does not eat sweets because of diabetes.    Allergies  Allergen Reactions  . Atorvastatin Other (See Comments)    Leg pain  . Ibuprofen Hives  . Pravastatin Other (See Comments)    Leg pain  . Simvastatin Other (See Comments)    Leg pain    Current Facility-Administered Medications  Medication Dose Route Frequency Provider Last Rate Last Dose  . 0.9 %  sodium chloride infusion (Manually program via Guardrails IV Fluids)   Intravenous Once Ruta Hinds E, MD      . 0.9 %  sodium chloride infusion   Intravenous Continuous Fields, Jessy Oto, MD      . alum & mag hydroxide-simeth (MAALOX/MYLANTA) 200-200-20 MG/5ML suspension 15-30 mL  15-30 mL Oral Q2H PRN Elam Dutch, MD      . guaiFENesin-dextromethorphan (ROBITUSSIN DM) 100-10 MG/5ML syrup 15 mL  15 mL Oral Q4H PRN Elam Dutch, MD      .  hydrALAZINE (APRESOLINE) injection 5 mg  5 mg Intravenous Q20 Min PRN Fields, Jessy Oto, MD      . labetalol (NORMODYNE,TRANDATE) injection 10 mg  10 mg Intravenous Q10 min PRN Elam Dutch, MD      . metoprolol tartrate (LOPRESSOR) injection 2-5 mg  2-5 mg Intravenous Q2H PRN Elam Dutch, MD      . ondansetron [facility]) injection 4 mg  4 mg Intravenous Q6H PRN Elam Dutch, MD      . pantoprazole (PROTONIX) EC tablet 40 mg  40 mg Oral Daily Elam Dutch, MD       Physical Examination  There were no vitals filed for this visit.  There is no height or weight on file to calculate BMI.  General:  Alert and oriented, no acute distress HEENT: Normal Cardiac: Regular Rate and Rhythm  Abdomen: Soft, non-tender, non-distended, no mass, healing left groin incision Some suprapubic ecchymosis Skin: No rash  DATA:  CBC    Component Value Date/Time   WBC 7.6 03/01/2018 1505   RBC 2.38 (L) 03/01/2018 1505   HGB 6.9 (LL) 03/01/2018 1505   HCT 22.0 (L) 03/01/2018 1505   PLT 266 03/01/2018 1505   MCV 92.4 03/01/2018 1505   MCH 29.0 03/01/2018 1505   MCHC 31.4 03/01/2018 1505   RDW 14.6 03/01/2018 1505   LYMPHSABS 1.4 09/21/2014 1100   MONOABS 0.6 09/21/2014 1100   EOSABS 0.2 09/21/2014 1100   BASOSABS 0.1 09/21/2014 1100    BMET    Component Value Date/Time   NA 135 03/01/2018 1505   K 4.0 03/01/2018 1505   CL 96 (L) 03/01/2018 1505   CO2 27 03/01/2018 1505   GLUCOSE 231 (H) 03/01/2018 1505   BUN 40 (H) 03/01/2018 1505   CREATININE 2.78 (H) 03/01/2018 1505   CREATININE 1.72 (H) 11/03/2014 0958   CALCIUM 9.0 03/01/2018 1505   GFRNONAA 21 (L) 03/01/2018 1505   GFRNONAA 39 (L) 07/20/2013 1120   GFRAA 24 (L) 03/01/2018 1505   GFRAA 46 (L) 07/20/2013 1120     ASSESSMENT: Symptomatic acute blood loss anemia   PLAN:  23 hr obs for transfusion  Renal function fairly stable usually around 2.4   Ruta Hinds, MD [facility] Office: [phone] Pager: [phone]

## 2018-03-01 NOTE — ED Notes (Signed)
Pt. Went to the entrance to the ED and laid down beside the big column. Nurse 1st security and Nurse Tech went over to him to get him off the floor.  Put in a wheel chair and put back into the lobby with his wife. Pt. Very upset with his time of waiting.

## 2018-03-01 NOTE — Progress Notes (Signed)
New Admission Note:   Arrival Method: Wheelchair  Mental Orientation: Alert and oriented X 4 Telemetry:54m08  Assessment: Completed Skin:Patient refused  FM:MCRFV AC  Pain:0/10  Tubes: none Safety Measures: Safety Fall Prevention Plan has been given, discussed and signed Admission: Completed [facility] Orientation: Patient has been orientated to the room, unit and staff.  Family: Wife at the bedside   Orders have been reviewed and implemented. Will continue to monitor the patient. Call light has been placed within reach and bed alarm has been activated.   Valdis Bevill RN [facility] Phone: [phone]

## 2018-03-01 NOTE — ED Triage Notes (Signed)
Pt reports having recent surgery last week. Went to follow up appt and sent here due to hgb 7. Reports generalized fatigue, no acute distress is noted at triage.

## 2018-03-02 DIAGNOSIS — D62 Acute posthemorrhagic anemia: Secondary | ICD-10-CM | POA: Diagnosis not present

## 2018-03-02 LAB — TYPE AND SCREEN
ABO/RH(D): B POS
Antibody Screen: NEGATIVE
Unit division: 0
Unit division: 0

## 2018-03-02 LAB — BPAM RBC
BLOOD PRODUCT EXPIRATION DATE: 201909272359
BLOOD PRODUCT EXPIRATION DATE: 201909272359
ISSUE DATE / TIME: 201909092034
ISSUE DATE / TIME: 201909092356
Unit Type and Rh: 7300
Unit Type and Rh: 7300

## 2018-03-02 NOTE — Progress Notes (Signed)
Pt discharged after 2nd unit of PRBC complete,VSS wife at bedside,DR Fields notified and wrote AVS discharge instructions,explained discharged instructions patient verbalized understanding,NT wheeled pt out.pt had no complaints of bil incision pain or no visible drainage,swelling,or redness.

## 2018-03-02 NOTE — Discharge Summary (Signed)
Physician Discharge Summary   Patient ID: Lance Collier [id number] 72 y.o. Jun 04, 1946  Admit date: 03/01/2018  Discharge date and time: 03/02/2018  3:28 AM   Admitting Physician: Elam Dutch, MD   Discharge Physician: same  Admission Diagnoses: low hgb sent by dr  Discharge Diagnoses: same  Admission Condition: fair  Discharged Condition: fair  Indication for Admission: anemia, SOB  Hospital Course: [name] is a 72y.o. Male who was sent to the ER by his PCP due to suspected anemia with SOB.  He is s/p B CIA stnets and L femoral endarterectomy 1 week ago and was discharged with Hgb 7.5.  H&H in ER 6.9, 22.  He was transfused 2 units of PRBCs and tolerated well.  SOB resolved.  He will follow up as scheduled with Dr. Oneida Alar 03/18/18.  He was discharged in stable condition.  Consults: None  Treatments: transfused 2u PRBCs  Discharge Exam: see H&P Vitals:   03/02/18 0021 03/02/18 0256  BP: (!) 152/52 (!) 140/48  Pulse: (!) 56 (!) 53  Resp: 20 16  Temp: 99.1 F (37.3 C) 98.9 F (37.2 C)  SpO2:  97%     Disposition: home  Patient Instructions:  Allergies as of 03/02/2018      Reactions   Atorvastatin Other (See Comments)   Leg pain   Ibuprofen Hives   Pravastatin Other (See Comments)   Leg pain   Simvastatin Other (See Comments)   Leg pain      Medication List    TAKE these medications   acetaminophen 325 MG tablet Commonly known as:  TYLENOL Take 650 mg by mouth daily as needed for mild pain or headache.   amLODipine 5 MG tablet Commonly known as:  NORVASC Take 5 mg by mouth daily.   aspirin EC 81 MG tablet Take 1 tablet (81 mg total) by mouth every other day. What changed:    when to take this  additional instructions   carvedilol 12.5 MG tablet Commonly known as:  COREG TAKE 1 TABLET BY MOUTH TWICE DAILY.   clopidogrel 75 MG tablet Commonly known as:  PLAVIX TAKE 1 TABLET DAILY WITH BREAKFAST. What changed:  when to take this    Evolocumab 140 MG/ML Soaj Inject 140 mg into the skin every 14 (fourteen) days.   fenofibrate 160 MG tablet Take 1 tablet (160 mg total) by mouth daily.   ferrous sulfate 325 (65 FE) MG tablet Take 1 tablet (325 mg total) by mouth daily. What changed:  Another medication with the same name was removed. Continue taking this medication, and follow the directions you see here.   furosemide 40 MG tablet Commonly known as:  LASIX Take 120 mg in the morning and 80 mg in the evening  May take an extra 40 mg if needed for 3 lb increase What changed:    how much to take  how to take this  when to take this   HUMALOG KWIKPEN 200 UNIT/ML Sopn Generic drug:  Insulin Lispro Inject 20-28 Units into the skin See admin instructions. Use 20 units (base) per meal increase units given by 1 unit for every 15 increment over 150 (I.E. Blood glucose reading 165=21 units, 180=22 units, etc. )   hydrALAZINE 100 MG tablet Commonly known as:  APRESOLINE TAKE 1 TABLET BY MOUTH 3 TIMES A DAY.   isosorbide mononitrate 60 MG 24 hr tablet Commonly known as:  IMDUR TAKE 1 TABLET ONCE DAILY.   neomycin-bacitracin-polymyxin ointment Commonly known as:  NEOSPORIN Apply 1 application topically 3 (three) times daily as needed for wound care.   NITROSTAT 0.4 MG SL tablet Generic drug:  nitroGLYCERIN DISSOLVE 1 TABLET UNDER TONGUE AS NEEDED FOR CHEST PAIN,MAY REPEAT IN5 MINUTES FOR 2 DOSES.   oxyCODONE-acetaminophen 5-325 MG tablet Commonly known as:  PERCOCET/ROXICET Take 1 tablet by mouth every 6 (six) hours as needed for moderate pain.   pantoprazole 40 MG tablet Commonly known as:  PROTONIX Take 1 tablet (40 mg total) by mouth daily.   rosuvastatin 10 MG tablet Commonly known as:  CRESTOR TAKE 10 MG Tuesday, Thursday,SATURDAY,SUNDAY What changed:    how much to take  how to take this  when to take this  additional instructions   senna 8.6 MG Tabs tablet Commonly known as:  SENOKOT Take  1 tablet (8.6 mg total) by mouth daily.   STOOL SOFTENER 100 MG capsule Generic drug:  docusate sodium Take 100 mg by mouth daily as needed for mild constipation.   TOUJEO MAX SOLOSTAR 300 UNIT/ML Sopn Generic drug:  Insulin Glargine Inject 70 Units into the skin 2 (two) times daily.   VASCEPA 1 g Caps Generic drug:  Icosapent Ethyl Take 2 g by mouth 2 (two) times daily.      Activity: activity as tolerated Diet: regular diet Wound Care: keep wound clean and dry  Follow-up with Dr. Oneida Alar as scheduled 03/18/18.  SignedDagoberto Ligas 03/02/2018 9:07 AM

## 2018-03-08 DIAGNOSIS — H3563 Retinal hemorrhage, bilateral: Secondary | ICD-10-CM | POA: Diagnosis not present

## 2018-03-08 DIAGNOSIS — E113553 Type 2 diabetes mellitus with stable proliferative diabetic retinopathy, bilateral: Secondary | ICD-10-CM | POA: Diagnosis not present

## 2018-03-09 DIAGNOSIS — Z6831 Body mass index (BMI) 31.0-31.9, adult: Secondary | ICD-10-CM | POA: Diagnosis not present

## 2018-03-09 DIAGNOSIS — I739 Peripheral vascular disease, unspecified: Secondary | ICD-10-CM | POA: Diagnosis not present

## 2018-03-09 DIAGNOSIS — E1122 Type 2 diabetes mellitus with diabetic chronic kidney disease: Secondary | ICD-10-CM | POA: Diagnosis not present

## 2018-03-09 DIAGNOSIS — Z23 Encounter for immunization: Secondary | ICD-10-CM | POA: Diagnosis not present

## 2018-03-09 DIAGNOSIS — N183 Chronic kidney disease, stage 3 (moderate): Secondary | ICD-10-CM | POA: Diagnosis not present

## 2018-03-09 DIAGNOSIS — I129 Hypertensive chronic kidney disease with stage 1 through stage 4 chronic kidney disease, or unspecified chronic kidney disease: Secondary | ICD-10-CM | POA: Diagnosis not present

## 2018-03-09 DIAGNOSIS — D631 Anemia in chronic kidney disease: Secondary | ICD-10-CM | POA: Diagnosis not present

## 2018-03-09 DIAGNOSIS — I2510 Atherosclerotic heart disease of native coronary artery without angina pectoris: Secondary | ICD-10-CM | POA: Diagnosis not present

## 2018-03-10 ENCOUNTER — Telehealth: Payer: Self-pay | Admitting: *Deleted

## 2018-03-10 NOTE — Telephone Encounter (Signed)
Patient called requesting appt for wound check. Left groin very tender and "gooey". Could not tell me if area was hot, red or if any fever. He is back at work. Agreeable to see NP  03/11/18.

## 2018-03-11 ENCOUNTER — Encounter: Payer: Self-pay | Admitting: Family

## 2018-03-11 ENCOUNTER — Ambulatory Visit (INDEPENDENT_AMBULATORY_CARE_PROVIDER_SITE_OTHER): Payer: Self-pay | Admitting: Family

## 2018-03-11 ENCOUNTER — Other Ambulatory Visit: Payer: Self-pay

## 2018-03-11 VITALS — BP 124/58 | HR 62 | Temp 97.2°F | Resp 18 | Ht 68.0 in | Wt 208.6 lb

## 2018-03-11 DIAGNOSIS — N184 Chronic kidney disease, stage 4 (severe): Secondary | ICD-10-CM

## 2018-03-11 DIAGNOSIS — I739 Peripheral vascular disease, unspecified: Secondary | ICD-10-CM

## 2018-03-11 DIAGNOSIS — Z95828 Presence of other vascular implants and grafts: Secondary | ICD-10-CM

## 2018-03-11 DIAGNOSIS — T8131XA Disruption of external operation (surgical) wound, not elsewhere classified, initial encounter: Secondary | ICD-10-CM

## 2018-03-11 MED ORDER — CEPHALEXIN 500 MG PO CAPS: 500.0000 mg | ORAL_CAPSULE | Freq: Two times a day (BID) | ORAL | 0 refills | Status: DC

## 2018-03-11 NOTE — Progress Notes (Signed)
Postoperative Visit   History of Present Illness  Lance Collier is a 72 y.o. male who is s/p bilateral common iliac stent (8 x 54 VBX), left external iliac common femoral superficial femoral endarterectomy on 02-23-18 by Dr. Oneida Alar for claudication. He is also s/p left carotid endarterectomy in 2017.    He has been concerned about an intervention in the past secondary to his renal dysfunction.  He is recently seen his nephrologist who thought that he would be at low risk overall for being on dialysis if he developed contrast nephropathy.  Other medical problems include hypertension and diabetes both of which are currently controlled.  He is on aspirin Plavix and a statin.  He returns today after patient called requesting appt for wound check. Left groin very tender and "gooey". He is back at work, is an Forensic psychologist. He denies fever or chills.  The patient is able to complete his activities of daily living.    DM: yes, A1C was 7.3 on 02-16-18 Tobacco use: quit in 1998, smoked x 27 years  For VQI Use Only  PRE-ADM LIVING: Home  AMB STATUS: Ambulatory   Past Medical History:  Diagnosis Date  . Anemia   . Aortic sclerosis  - without Stenosis  March 2014   Echo: Aortic sclerosis without stenosis. EF 55-60% with normal WM; mild concentric hypertrophy with normal relaxation.  . Arthritis   . CAD (coronary artery disease), autologous vein bypass graft 09/2014   10/03/14: Cath for Class III-IV Angina -- 80% ostial-proximal SVG-RPDA (minimal flow down native PDA from native RCA with 50% proximal and distal, patent LIMA-LAD, SVG-OM with retrograde filling of OM 2. --> 10/12/14: Staged PCI of SVG-RPDA - Xience DES  3.0 mm x 38 mm; (3.4  - 3.3 mm)  . CAD S/P percutaneous coronary angioplasty 06/12/2011; 09/2104   a) 2012: Complex PCI mRCA ([place]) --  2 overlapping Promus element DES stents.; 09/2014:  ost-prox SVG-rPDA - Xience DES 3.0 mm x 38 mm; (3.4  - 3.3 mm)  . CHF (congestive heart  failure) ([place])   . Childhood asthma   . CKD (chronic kidney disease), stage III ([place])    "mild; related to diabetes" (10/02/2014)  . Complication of anesthesia    "I've had name recall recognition problems since my last anesthesia"  . DM (diabetes mellitus) type II uncontrolled with eye manifestation ([place]) 2013   Diabetic retinopathy with retinal hemorrhages since;, recurrent in August 2014 treated with Avastin shots  . Dyspnea    mainly with exertion  . GERD (gastroesophageal reflux disease)   . Heart murmur    questionable  . High cholesterol   . Hypertension   . Migraine ~ 2004   "once"  . PAD (peripheral artery disease) ([facility])    with claudication  . PONV (postoperative nausea and vomiting)    s/p tonsillectomy  . 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 07/01/2013 (Cath for Crescendo Unstable Angina) --> [name]: For LM disease; LIMA-LAD, SVG-RCA, SCG-Cx; b. 09/2014 Cath/Staged PCI: patent LIMA->LAD, VG->OM1/RI, VG->RCA 80% (3.0x38 Xience Alpine DES on 10/12/2014).  . 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  Likely thromboembolic event with PDA occlusion. 06/12/2011   Post Complex PCI, pt states no mi  . Wears glasses    reading    Past Surgical History:  Procedure Laterality Date  . ABDOMINAL AORTOGRAM W/LOWER EXTREMITY N/A 02/05/2018   Procedure: ABDOMINAL AORTOGRAM W/LOWER EXTREMITY;  Surgeon: Oneida Alar,  Jessy Oto, MD;  Location: [facility];  Service: Cardiovascular;  Laterality: N/A;  . CARDIAC CATHETERIZATION  06/28/2013   ostial LM disese, RCA ~40-50%ISR  . CAROTID ENDARTERECTOMY Left 05/21/2016  . COLONOSCOPY    . CORONARY ANGIOPLASTY WITH STENT PLACEMENT Right September 2012   2 overlapping Promus DES 2.7 mm at 12 mm x2; postdilated to 3 mm  . CORONARY ARTERY BYPASS GRAFT N/A 07/01/2013   Procedure: CORONARY ARTERY BYPASS GRAFTING (CABG);  Surgeon: Grace Isaac, MD;  Location: [facility];  Service: Open Heart Surgery;   Laterality: N/A;  Times 3 using left internal mammary artery and endoscopically harvested bilateral saphenous vein  . ENDARTERECTOMY Left 05/21/2016   Procedure: LEFT CAROTID ENDARTERECTOMY;  Surgeon: Elam Dutch, MD;  Location: [facility];  Service: Vascular;  Laterality: Left;  . ENDARTERECTOMY FEMORAL Left 02/23/2018   Procedure: LEFT COMMON FEMORAL ENDARTERECTOMY;  Surgeon: Elam Dutch, MD;  Location: [facility];  Service: Vascular;  Laterality: Left;  . EYE SURGERY Bilateral    ioc for cataracts  . INSERTION OF ILIAC STENT Bilateral 02/23/2018   Procedure: INSERTION OF BILATERAL COMMON ILIAC STENTS WITH VIABAHN STENTS;  Surgeon: Elam Dutch, MD;  Location: [facility];  Service: Vascular;  Laterality: Bilateral;  . INTRAOPERATIVE TRANSESOPHAGEAL ECHOCARDIOGRAM N/A 07/01/2013   Procedure: INTRAOPERATIVE TRANSESOPHAGEAL ECHOCARDIOGRAM;  Surgeon: Grace Isaac, MD;  Location: [facility];  Service: Open Heart Surgery;  Laterality: N/A;  . LEFT AND RIGHT HEART CATHETERIZATION WITH CORONARY/GRAFT ANGIOGRAM N/A 10/03/2014   Procedure: LEFT AND RIGHT HEART CATHETERIZATION WITH Beatrix Fetters;  Surgeon: Leonie Man, MD;  Location: [facility];  Service: Cardiovascular;  Laterality: N/A;  . LEFT HEART CATHETERIZATION WITH CORONARY ANGIOGRAM N/A 06/11/2011   Procedure: LEFT HEART CATHETERIZATION WITH CORONARY ANGIOGRAM;  Surgeon: Fulton Reek, MD;  Location: [facility] CATH LAB;  Service: Cardiovascular;  Laterality: N/A;  . LEFT HEART CATHETERIZATION WITH CORONARY ANGIOGRAM N/A 06/28/2013   Procedure: LEFT HEART CATHETERIZATION WITH CORONARY ANGIOGRAM;  Surgeon: Leonie Man, MD;  Location: [facility] CATH LAB;  Service: Cardiovascular;  Laterality: N/A;  . NM MYOVIEW LTD  January '13   Diaphragmatic attenuation versus inferior infarct. No ischemia. Normal EF normal wall motion.  Marland Kitchen NM MYOVIEW LTD  June 2014   no ischemia  . NM MYOVIEW LTD  10/2014   LEXISCAN: Normal EF, 61%, No ischemia or infarction.   Mild diaphragmatic attenuation  . PATCH ANGIOPLASTY Left 05/21/2016   Procedure: PATCH ANGIOPLASTY USING HEMASHIELD PLATINUM FINESSE 0.3in x 3 in;  Surgeon: Elam Dutch, MD;  Location: [facility];  Service: Vascular;  Laterality: Left;  . PATCH ANGIOPLASTY Left 02/23/2018   Procedure: PATCH ANGIOPLASTY LEFT COMMON FEMORAL ARTERY;  Surgeon: Elam Dutch, MD;  Location: [facility];  Service: Vascular;  Laterality: Left;  . PERCUTANEOUS CORONARY STENT INTERVENTION (PCI-S)  06/11/2011   Procedure: PERCUTANEOUS CORONARY STENT INTERVENTION (PCI-S);  Surgeon: Fulton Reek, MD;  Location: [facility] CATH LAB;  Service: Cardiovascular;;  . PERCUTANEOUS CORONARY STENT INTERVENTION (PCI-S) N/A 10/12/2014   Procedure: PERCUTANEOUS CORONARY STENT INTERVENTION (PCI-S);  Surgeon: Leonie Man, MD;  Location: [facility] CATH LAB;  Ostial-Prox SVG-RCA Xience Alpine DES 3.0 mm x 38 mm; (3.4  - 3.3 mm)  . RETINAL LASER PROCEDURE Bilateral    "more than once"   . TONSILLECTOMY  as child  . TRANSTHORACIC ECHOCARDIOGRAM  03/2017    EF 55-60%.  Normal wall thickness.  No regional wall motion normalities.  GR 2  DD.  Mild-moderate calcified aortic valve.  Mild RV systolic dysfunction.  Moderate RV dilation.  Moderately increased PA pressures.  Domingo Dimes ECHOCARDIOGRAM  April 2016   Mod focal basal & mild overall LVH. EF 55-60%. Mild Ao Sclerosis, Mild MR with MAC. Only minimally elevated RVP.    Family History  Problem Relation Age of Onset  . Lung cancer Sister   . Breast cancer Sister   . Breast cancer Mother   . Hyperlipidemia Father   . Hypertension Father   . Throat cancer Father   . Colon polyps Father   . Liver cancer Maternal Grandmother   . Stomach cancer Neg Hx     Social History   Socioeconomic History  . Marital status: Married    Spouse name: Not on file  . Number of children: Not on file  . Years of education: Not on file  . Highest education level: Not on file  Occupational History  .  Occupation: attorney  Social Needs  . Financial resource strain: Not on file  . Food insecurity:    Worry: Not on file    Inability: Not on file  . Transportation needs:    Medical: Not on file    Non-medical: Not on file  Tobacco Use  . Smoking status: Former Smoker    Packs/day: 2.00    Years: 27.00    Pack years: 54.00    Types: Cigarettes    Last attempt to quit: 11/23/1996    Years since quitting: 21.3  . Smokeless tobacco: Never Used  Substance and Sexual Activity  . Alcohol use: Yes    Comment: rarely  . Drug use: No  . Sexual activity: Not Currently  Lifestyle  . Physical activity:    Days per week: Not on file    Minutes per session: Not on file  . Stress: Not on file  Relationships  . Social connections:    Talks on phone: Not on file    Gets together: Not on file    Attends religious service: Not on file    Active member of club or organization: Not on file    Attends meetings of clubs or organizations: Not on file    Relationship status: Not on file  . Intimate partner violence:    Fear of current or ex partner: Not on file    Emotionally abused: Not on file    Physically abused: Not on file    Forced sexual activity: Not on file  Other Topics Concern  . 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 He notably has not been exercising like he used to. He [place] walk [place] and [place] He has social alcoholic beverages but is trying to cut that back and does not smoke.       He tells me he is a sucker for chips but does not eat sweets because of diabetes.    Allergies  Allergen Reactions  . Atorvastatin Other (See Comments)    Leg pain  . Ibuprofen Hives  . Pravastatin Other (See Comments)    Leg pain  . Simvastatin Other (See Comments)    Leg pain    Current  Outpatient Medications on File Prior to Visit  Medication Sig Dispense Refill  .  acetaminophen (TYLENOL) 325 MG tablet Take 650 mg by mouth daily as needed for mild pain or headache.     Marland Kitchen amLODipine (NORVASC) 5 MG tablet Take 5 mg by mouth daily.  5  . aspirin EC 81 MG tablet Take 1 tablet (81 mg total) by mouth every other day. (Patient taking differently: Take 81 mg by mouth See admin instructions. Take 34m once daily on Tues, Thurs, Sat, and Sun)    . carvedilol (COREG) 12.5 MG tablet TAKE 1 TABLET BY MOUTH TWICE DAILY. (Patient taking differently: Take 12.5 mg by mouth 2 (two) times daily. ) 180 tablet 3  . clopidogrel (PLAVIX) 75 MG tablet TAKE 1 TABLET DAILY WITH BREAKFAST. (Patient taking differently: Take 75 mg by mouth daily. ) 90 tablet 2  . docusate sodium (STOOL SOFTENER) 100 MG capsule Take 100 mg by mouth daily as needed for mild constipation.     . Evolocumab (REPATHA SURECLICK) 1017MG/ML SOAJ Inject 140 mg into the skin every 14 (fourteen) days. 2 pen 12  . fenofibrate 160 MG tablet Take 1 tablet (160 mg total) by mouth daily. 90 tablet 3  . ferrous sulfate 325 (65 FE) MG tablet Take 1 tablet (325 mg total) by mouth daily. 30 tablet 0  . furosemide (LASIX) 40 MG tablet Take 120 mg in the morning and 80 mg in the evening  May take an extra 40 mg if needed for 3 lb increase (Patient taking differently: Take 120 mg by mouth 2 (two) times daily. Take 120 mg in the morning and 80 mg in the evening  May take an extra 40 mg if needed for 3 lb increase) 300 tablet 3  . HUMALOG KWIKPEN 200 UNIT/ML SOPN Inject 20-28 Units into the skin See admin instructions. Use 20 units (base) per meal increase units given by 1 unit for every 15 increment over 150 (I.E. Blood glucose reading 165=21 units, 180=22 units, etc. )  5  . hydrALAZINE (APRESOLINE) 100 MG tablet TAKE 1 TABLET BY MOUTH 3 TIMES A DAY. (Patient taking differently: Take 100 mg by mouth 3 (three) times daily. ) 90 tablet 2  . isosorbide  mononitrate (IMDUR) 60 MG 24 hr tablet TAKE 1 TABLET ONCE DAILY. (Patient taking differently: Take 60 mg by mouth daily. ) 90 tablet 3  . neomycin-bacitracin-polymyxin (NEOSPORIN) ointment Apply 1 application topically 3 (three) times daily as needed for wound care.    .Marland KitchenNITROSTAT 0.4 MG SL tablet DISSOLVE 1 TABLET UNDER TONGUE AS NEEDED FOR CHEST PAIN,MAY REPEAT IN5 MINUTES FOR 2 DOSES. 25 tablet 1  . oxyCODONE-acetaminophen (PERCOCET/ROXICET) 5-325 MG tablet Take 1 tablet by mouth every 6 (six) hours as needed for moderate pain. 20 tablet 0  . pantoprazole (PROTONIX) 40 MG tablet Take 1 tablet (40 mg total) by mouth daily. 90 tablet 3  . rosuvastatin (CRESTOR) 10 MG tablet TAKE 10 MG Tuesday, Thursday,SATURDAY,SUNDAY (Patient taking differently: Take 10 mg by mouth every Tuesday, Thursday, Saturday, and Sunday. ) 90 tablet 3  . senna (SENOKOT) 8.6 MG TABS tablet Take 1 tablet (8.6 mg total) by mouth daily. 120 each 0  . TOUJEO MAX SOLOSTAR 300 UNIT/ML SOPN Inject 70 Units into the skin 2 (two) times daily.   1  . VASCEPA 1 G CAPS Take 2 g by mouth 2 (two) times daily.   4   No current facility-administered medications on file prior to visit.       Physical Examination  Vitals:   03/11/18 1256  BP: (Marland Kitchen  124/58  Pulse: 62  Resp: 18  Temp: (!) 97.2 F (36.2 C)  TempSrc: Oral  SpO2: 97%  Weight: 208 lb 9.6 oz (94.6 kg)  Height: 5' 8"  (1.727 m)   Body mass index is 31.72 kg/m.  PHYSICAL EXAMINATION: General: The patient appears his stated age, obese male.   HEENT:  No gross abnormalities Pulmonary: Respirations are non-labored Abdomen: Soft and non-tender with normal bowel sounds Musculoskeletal: There are no major deformities.   Neurologic: No focal weakness or paresthesias are detected Skin: There are no rashes noted. Left groin incision with slight separation and small amount ischemic tissue,  oozing scant amount of clear watery drainage, slight maceration with moderate  erythema, no purulence. Psychiatric: The patient has normal affect. Cardiovascular: There is a regular rate and rhythm without significant murmur appreciated.   Vascular: Vessel Right Left  Radial Palpable Palpable  Aorta Not palpable N/A  Femoral 2+Palpable Not Palpable due to hypertrophic skin and tenderness to palpation   Popliteal Not palpable Not palpable  PT not Palpable, no Doppler signal not Palpable, no Doppler signal  DP not Palpable, + Doppler signal not Palpable, + Doppler signal    Medical Decision Making  Lance Collier is a 72 y.o. male who presents s/p bilateral common iliac stent (8 x 84 VBX), left external iliac common femoral superficial femoral endarterectomy on 02-23-18 by Dr. Oneida Alar for claudication. He is also s/p left carotid endarterectomy in 2017.     Left groin incision separation: Dr. Oneida Alar debrided a small amount of ischemic tissue from left groin wound which is moist, oozing scant amount of clear watery drainage. Left groin packed with 2x2 gauze, more 2x2 gauze on top with paper tape. Will arrange [facility] for daily wet to dry dressing changes and left groin wound evaluation, pt to continie daily shower with soap. Keflex 500 mg po bid (CKD, GFR 21, stage 4), x 1 week.  Return as already scheduled in 1 week for wound check with Dr. Oneida Alar.   I discussed in depth with the patient the nature of atherosclerosis, and emphasized the importance of maximal medical management including strict control of blood pressure, blood glucose, and lipid levels, obtaining regular exercise, and cessation of smoking.  The patient is aware that without maximal medical management the underlying atherosclerotic disease process will progress, limiting the benefit of any interventions.   Thank you for allowing [place] to participate in this patient's care.  Clemon Chambers, RN, MSN, FNP-C [facility] Office: [phone]  03/11/2018, 1:05 PM  Clinic MD:  Oneida Alar

## 2018-03-15 DIAGNOSIS — D500 Iron deficiency anemia secondary to blood loss (chronic): Secondary | ICD-10-CM | POA: Diagnosis not present

## 2018-03-18 ENCOUNTER — Other Ambulatory Visit: Payer: Self-pay

## 2018-03-18 ENCOUNTER — Other Ambulatory Visit: Payer: Self-pay | Admitting: Cardiology

## 2018-03-18 ENCOUNTER — Ambulatory Visit (HOSPITAL_COMMUNITY)
Admission: RE | Admit: 2018-03-18 | Discharge: 2018-03-18 | Disposition: A | Discharge status: Home / Self Care | Payer: Medicare Other | Source: Ambulatory Visit | Attending: Vascular Surgery | Admitting: Vascular Surgery

## 2018-03-18 ENCOUNTER — Encounter: Payer: Self-pay | Admitting: Vascular Surgery

## 2018-03-18 ENCOUNTER — Ambulatory Visit (INDEPENDENT_AMBULATORY_CARE_PROVIDER_SITE_OTHER): Payer: Self-pay | Admitting: Vascular Surgery

## 2018-03-18 VITALS — BP 136/61 | HR 72 | Temp 98.0°F | Resp 20 | Ht 68.0 in | Wt 211.0 lb

## 2018-03-18 DIAGNOSIS — I739 Peripheral vascular disease, unspecified: Secondary | ICD-10-CM | POA: Diagnosis not present

## 2018-03-18 NOTE — Progress Notes (Signed)
Patient is a 72 year old male who returns for postoperative follow-up today.  He recently underwent bilateral common iliac stenting and a left common femoral endarterectomy with profundoplasty.  He has had some trouble healing up his left groin wound.  He denies any fever or chills.  He still has some drainage from the left groin.  The soreness is slightly improved.  However, he still has not been able to walk enough to see if his claudication is overall improved.  Physical exam:  Vitals:   03/18/18 1538  BP: 136/61  Pulse: 72  Resp: 20  Temp: 98 F (36.7 C)  TempSrc: Oral  SpO2: 95%  Weight: 211 lb (95.7 kg)  Height: 5\' 8"  (1.727 m)    Left groin 2 cm x 2 cm x 2 cm wound inferior pole of left groin incision some fibrinous exudate was again debrided today.  No obvious abscess no fluctuance.  Data: Patient had bilateral ABIs performed today which were 0.57 on the right 0.35 on the left essentially unchanged  Assessment: Poorly healing left groin incision  Plan: We will start local wound care with home health today normal saline wet-to-dry twice a day.  Patient will follow-up with me in 1 week.  He is planning a beach trip in a couple of weeks and hopefully the wound will be reasonably manageable for him to go on this trip.  Ruta Hinds, MD [facility] Office: [phone] Pager: [phone]

## 2018-03-20 DIAGNOSIS — N183 Chronic kidney disease, stage 3 (moderate): Secondary | ICD-10-CM | POA: Diagnosis not present

## 2018-03-20 DIAGNOSIS — I2510 Atherosclerotic heart disease of native coronary artery without angina pectoris: Secondary | ICD-10-CM | POA: Diagnosis not present

## 2018-03-20 DIAGNOSIS — I509 Heart failure, unspecified: Secondary | ICD-10-CM | POA: Diagnosis not present

## 2018-03-20 DIAGNOSIS — T8189XD Other complications of procedures, not elsewhere classified, subsequent encounter: Secondary | ICD-10-CM | POA: Diagnosis not present

## 2018-03-20 DIAGNOSIS — I130 Hypertensive heart and chronic kidney disease with heart failure and stage 1 through stage 4 chronic kidney disease, or unspecified chronic kidney disease: Secondary | ICD-10-CM | POA: Diagnosis not present

## 2018-03-20 DIAGNOSIS — E1122 Type 2 diabetes mellitus with diabetic chronic kidney disease: Secondary | ICD-10-CM | POA: Diagnosis not present

## 2018-03-21 DIAGNOSIS — E1122 Type 2 diabetes mellitus with diabetic chronic kidney disease: Secondary | ICD-10-CM | POA: Diagnosis not present

## 2018-03-21 DIAGNOSIS — T8189XD Other complications of procedures, not elsewhere classified, subsequent encounter: Secondary | ICD-10-CM | POA: Diagnosis not present

## 2018-03-21 DIAGNOSIS — I130 Hypertensive heart and chronic kidney disease with heart failure and stage 1 through stage 4 chronic kidney disease, or unspecified chronic kidney disease: Secondary | ICD-10-CM | POA: Diagnosis not present

## 2018-03-21 DIAGNOSIS — N183 Chronic kidney disease, stage 3 (moderate): Secondary | ICD-10-CM | POA: Diagnosis not present

## 2018-03-21 DIAGNOSIS — I509 Heart failure, unspecified: Secondary | ICD-10-CM | POA: Diagnosis not present

## 2018-03-21 DIAGNOSIS — I2510 Atherosclerotic heart disease of native coronary artery without angina pectoris: Secondary | ICD-10-CM | POA: Diagnosis not present

## 2018-03-22 ENCOUNTER — Other Ambulatory Visit: Payer: Self-pay | Admitting: Cardiology

## 2018-03-22 DIAGNOSIS — I2510 Atherosclerotic heart disease of native coronary artery without angina pectoris: Secondary | ICD-10-CM | POA: Diagnosis not present

## 2018-03-22 DIAGNOSIS — I130 Hypertensive heart and chronic kidney disease with heart failure and stage 1 through stage 4 chronic kidney disease, or unspecified chronic kidney disease: Secondary | ICD-10-CM | POA: Diagnosis not present

## 2018-03-22 DIAGNOSIS — I509 Heart failure, unspecified: Secondary | ICD-10-CM | POA: Diagnosis not present

## 2018-03-22 DIAGNOSIS — T8189XD Other complications of procedures, not elsewhere classified, subsequent encounter: Secondary | ICD-10-CM | POA: Diagnosis not present

## 2018-03-22 DIAGNOSIS — E1122 Type 2 diabetes mellitus with diabetic chronic kidney disease: Secondary | ICD-10-CM | POA: Diagnosis not present

## 2018-03-22 DIAGNOSIS — N183 Chronic kidney disease, stage 3 (moderate): Secondary | ICD-10-CM | POA: Diagnosis not present

## 2018-03-23 DIAGNOSIS — T8189XD Other complications of procedures, not elsewhere classified, subsequent encounter: Secondary | ICD-10-CM | POA: Diagnosis not present

## 2018-03-23 DIAGNOSIS — I130 Hypertensive heart and chronic kidney disease with heart failure and stage 1 through stage 4 chronic kidney disease, or unspecified chronic kidney disease: Secondary | ICD-10-CM | POA: Diagnosis not present

## 2018-03-23 DIAGNOSIS — I509 Heart failure, unspecified: Secondary | ICD-10-CM | POA: Diagnosis not present

## 2018-03-23 DIAGNOSIS — N183 Chronic kidney disease, stage 3 (moderate): Secondary | ICD-10-CM | POA: Diagnosis not present

## 2018-03-23 DIAGNOSIS — I2510 Atherosclerotic heart disease of native coronary artery without angina pectoris: Secondary | ICD-10-CM | POA: Diagnosis not present

## 2018-03-23 DIAGNOSIS — E1122 Type 2 diabetes mellitus with diabetic chronic kidney disease: Secondary | ICD-10-CM | POA: Diagnosis not present

## 2018-03-24 ENCOUNTER — Other Ambulatory Visit: Payer: Self-pay | Admitting: Vascular Surgery

## 2018-03-24 ENCOUNTER — Other Ambulatory Visit: Payer: Self-pay

## 2018-03-24 ENCOUNTER — Ambulatory Visit (INDEPENDENT_AMBULATORY_CARE_PROVIDER_SITE_OTHER): Payer: Self-pay | Admitting: Vascular Surgery

## 2018-03-24 ENCOUNTER — Encounter: Payer: Self-pay | Admitting: Vascular Surgery

## 2018-03-24 ENCOUNTER — Telehealth: Payer: Self-pay | Admitting: Vascular Surgery

## 2018-03-24 VITALS — BP 124/56 | HR 60 | Temp 97.2°F | Resp 20 | Ht 68.0 in | Wt 208.1 lb

## 2018-03-24 DIAGNOSIS — I2510 Atherosclerotic heart disease of native coronary artery without angina pectoris: Secondary | ICD-10-CM | POA: Diagnosis not present

## 2018-03-24 DIAGNOSIS — S31104D Unspecified open wound of abdominal wall, left lower quadrant without penetration into peritoneal cavity, subsequent encounter: Secondary | ICD-10-CM

## 2018-03-24 DIAGNOSIS — E1122 Type 2 diabetes mellitus with diabetic chronic kidney disease: Secondary | ICD-10-CM | POA: Diagnosis not present

## 2018-03-24 DIAGNOSIS — N183 Chronic kidney disease, stage 3 (moderate): Secondary | ICD-10-CM | POA: Diagnosis not present

## 2018-03-24 DIAGNOSIS — I509 Heart failure, unspecified: Secondary | ICD-10-CM | POA: Diagnosis not present

## 2018-03-24 DIAGNOSIS — I130 Hypertensive heart and chronic kidney disease with heart failure and stage 1 through stage 4 chronic kidney disease, or unspecified chronic kidney disease: Secondary | ICD-10-CM | POA: Diagnosis not present

## 2018-03-24 DIAGNOSIS — I739 Peripheral vascular disease, unspecified: Secondary | ICD-10-CM

## 2018-03-24 DIAGNOSIS — T8189XD Other complications of procedures, not elsewhere classified, subsequent encounter: Secondary | ICD-10-CM | POA: Diagnosis not present

## 2018-03-24 MED ORDER — CEPHALEXIN 500 MG PO CAPS: 500.0000 mg | ORAL_CAPSULE | Freq: Two times a day (BID) | ORAL | 1 refills | Status: DC

## 2018-03-24 NOTE — Progress Notes (Signed)
Patient is a 72 year old male who returns for postoperative follow-up today.  He was last seen March 18, 2018.  He has had a slowly poorly healing left groin incision.  He recently underwent bilateral common iliac stenting as well as left femoral endarterectomy with profundoplasty.  This was on February 23, 2018.  Of note he has previously undergone left carotid endarterectomy 2017.  Reports some smell from the groin incision.  He is getting home health wound care.  But he states that this is going to expire in about 24 hours.  He is trying to go to [facility] in the [place] for a few weeks and is requesting [place] to try to set up home health for him while he is there.  Physical exam:  Vitals:   03/24/18 1600  BP: (!) 124/56  Pulse: 60  Resp: 20  Temp: (!) 97.2 F (36.2 C)  TempSrc: Oral  SpO2: 96%  Weight: 208 lb 1.6 oz (94.4 kg)  Height: 5\' 8"  (1.727 m)      Assessment: The wound does have some smell today however there is essentially 100% granulation at this point.  Since the wound did have a smell about it I also did a wound culture.  Plan: The patient was placed on Keflex 500 mg twice a day today.  I will follow-up on the wound culture in the next few days.  He will follow-up with me on October 24 after he returns from his trip.  We will try to arrange for home health visits and [place] for him.  We will also continue his home health wound care until the wound is completely healed here in [place].  He is currently [facility] with [facility].  Ruta Hinds, MD [facility] Office: [phone] Pager: [phone]

## 2018-03-24 NOTE — Telephone Encounter (Signed)
Lab cultures were taken today.  Lab called for pick-up.  Pts authorization # is [id number] for [employer].  [phone].  Thurston Hole., LPN

## 2018-03-25 DIAGNOSIS — I130 Hypertensive heart and chronic kidney disease with heart failure and stage 1 through stage 4 chronic kidney disease, or unspecified chronic kidney disease: Secondary | ICD-10-CM | POA: Diagnosis not present

## 2018-03-25 DIAGNOSIS — I509 Heart failure, unspecified: Secondary | ICD-10-CM | POA: Diagnosis not present

## 2018-03-25 DIAGNOSIS — I2510 Atherosclerotic heart disease of native coronary artery without angina pectoris: Secondary | ICD-10-CM | POA: Diagnosis not present

## 2018-03-25 DIAGNOSIS — E1122 Type 2 diabetes mellitus with diabetic chronic kidney disease: Secondary | ICD-10-CM | POA: Diagnosis not present

## 2018-03-25 DIAGNOSIS — T8189XD Other complications of procedures, not elsewhere classified, subsequent encounter: Secondary | ICD-10-CM | POA: Diagnosis not present

## 2018-03-25 DIAGNOSIS — N183 Chronic kidney disease, stage 3 (moderate): Secondary | ICD-10-CM | POA: Diagnosis not present

## 2018-03-26 DIAGNOSIS — N183 Chronic kidney disease, stage 3 (moderate): Secondary | ICD-10-CM | POA: Diagnosis not present

## 2018-03-26 DIAGNOSIS — E1122 Type 2 diabetes mellitus with diabetic chronic kidney disease: Secondary | ICD-10-CM | POA: Diagnosis not present

## 2018-03-26 DIAGNOSIS — T8189XD Other complications of procedures, not elsewhere classified, subsequent encounter: Secondary | ICD-10-CM | POA: Diagnosis not present

## 2018-03-26 DIAGNOSIS — I509 Heart failure, unspecified: Secondary | ICD-10-CM | POA: Diagnosis not present

## 2018-03-26 DIAGNOSIS — I130 Hypertensive heart and chronic kidney disease with heart failure and stage 1 through stage 4 chronic kidney disease, or unspecified chronic kidney disease: Secondary | ICD-10-CM | POA: Diagnosis not present

## 2018-03-26 DIAGNOSIS — I2510 Atherosclerotic heart disease of native coronary artery without angina pectoris: Secondary | ICD-10-CM | POA: Diagnosis not present

## 2018-03-27 DIAGNOSIS — N183 Chronic kidney disease, stage 3 (moderate): Secondary | ICD-10-CM | POA: Diagnosis not present

## 2018-03-27 DIAGNOSIS — E1122 Type 2 diabetes mellitus with diabetic chronic kidney disease: Secondary | ICD-10-CM | POA: Diagnosis not present

## 2018-03-27 DIAGNOSIS — T8189XD Other complications of procedures, not elsewhere classified, subsequent encounter: Secondary | ICD-10-CM | POA: Diagnosis not present

## 2018-03-27 DIAGNOSIS — I2510 Atherosclerotic heart disease of native coronary artery without angina pectoris: Secondary | ICD-10-CM | POA: Diagnosis not present

## 2018-03-27 DIAGNOSIS — I130 Hypertensive heart and chronic kidney disease with heart failure and stage 1 through stage 4 chronic kidney disease, or unspecified chronic kidney disease: Secondary | ICD-10-CM | POA: Diagnosis not present

## 2018-03-27 DIAGNOSIS — I509 Heart failure, unspecified: Secondary | ICD-10-CM | POA: Diagnosis not present

## 2018-03-27 LAB — WOUND CULTURE
MICRO NUMBER: 91184365
SPECIMEN QUALITY:: ADEQUATE

## 2018-03-30 ENCOUNTER — Telehealth: Payer: Self-pay | Admitting: Vascular Surgery

## 2018-03-30 NOTE — Telephone Encounter (Signed)
Pt culture growing serratia species not sensitive to keflex will change to levaquin 500 mg qd x 14 days.  Willl call in to [place].  [address], [phone]  Ruta Hinds, MD [facility] Office: [phone] Pager: [phone]

## 2018-04-12 ENCOUNTER — Telehealth: Payer: Self-pay | Admitting: Cardiology

## 2018-04-12 NOTE — Progress Notes (Signed)
Cardiology Office Note   Date:  04/13/2018   ID:  Lance Collier, DOB 1945-11-08, MRN [id number]  PCP:  Reynold Bowen, MD  Cardiologist:  Dr. Ellyn Hack  Chief Complaint  Patient presents with  . Coronary Artery Disease  . Congestive Heart Failure  . Hypertension  . PAD     History of Present Illness: Lance Collier is a 72 y.o. male who presents for ongoing assessment and management of of CAD, with hx of PCI to RCA with 2 overlapping DES in 2012, CABG (LIMA to LAD, SVG ro RCA, SCG to Cx 2016, Staged PCI to VG to RCA in 2016, , PAD and chronic diastolic CHF. Was last seen by Dr. Ellyn Hack on 11/23/2017 and was considering undergoing PV intervention. Other history includes hypercholesterolemia,  HTN, chronic LEE, CKD Stage II, Intermittent claudication.   Since being seen last, he underwent bilateral common iliac stenting and a common femoral approach endarterectomy with profundoplasty with Dr/ Oneida Alar on 02/23/2018.Marland Kitchen He has had improvement in his symptoms. He called our office on 04/12/2018 with complaints of dyspnea.   He states that he feels tired and is easily fatigued when walking around, with associated dyspnea. He denies chest pain or dizziness. 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    He states that his leg pain has gotten some better since the procedure.   Past Medical History:  Diagnosis Date  . Anemia   . Aortic sclerosis  - without Stenosis  March 2014   Echo: Aortic sclerosis without stenosis. EF 55-60% with normal WM; mild concentric hypertrophy with normal relaxation.  . Arthritis   . CAD (coronary artery disease), autologous vein bypass graft 09/2014   10/03/14: Cath for Class III-IV Angina -- 80% ostial-proximal SVG-RPDA (minimal flow down native PDA from native RCA with 50% proximal and distal, patent LIMA-LAD, SVG-OM with retrograde filling of OM 2. --> 10/12/14: Staged PCI of SVG-RPDA - Xience DES  3.0 mm x 38  mm; (3.4  - 3.3 mm)  . CAD S/P percutaneous coronary angioplasty 06/12/2011; 09/2104   a) 2012: Complex PCI mRCA ([place]) --  2 overlapping Promus element DES stents.; 09/2014:  ost-prox SVG-rPDA - Xience DES 3.0 mm x 38 mm; (3.4  - 3.3 mm)  . CHF (congestive heart failure) ([place])   . Childhood asthma   . CKD (chronic kidney disease), stage III ([place])    "mild; related to diabetes" (10/02/2014)  . Complication of anesthesia    "I've had name recall recognition problems since my last anesthesia"  . DM (diabetes mellitus) type II uncontrolled with eye manifestation ([place]) 2013   Diabetic retinopathy with retinal hemorrhages since;, recurrent in August 2014 treated with Avastin shots  . Dyspnea    mainly with exertion  . GERD (gastroesophageal reflux disease)   . Heart murmur    questionable  . High cholesterol   . Hypertension   . Migraine ~ 2004   "once"  . PAD (peripheral artery disease) ([facility])    with claudication  . PONV (postoperative nausea and vomiting)    s/p tonsillectomy  . 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 07/01/2013 (Cath for [place] Unstable Angina) --> [name]: For LM disease; LIMA-LAD, SVG-RCA, SCG-Cx; b. 09/2014 Cath/Staged PCI: patent LIMA->LAD, VG->OM1/RI, VG->RCA 80% (3.0x38 Xience Alpine DES on 10/12/2014).  . 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  Likely thromboembolic  event with PDA occlusion. 06/12/2011   Post Complex PCI, pt states no mi  . Wears glasses    reading    Past Surgical History:  Procedure Laterality Date  . ABDOMINAL AORTOGRAM W/LOWER EXTREMITY N/A 02/05/2018   Procedure: ABDOMINAL AORTOGRAM W/LOWER EXTREMITY;  Surgeon: Elam Dutch, MD;  Location: [facility];  Service: Cardiovascular;  Laterality: N/A;  . CARDIAC CATHETERIZATION  06/28/2013   ostial LM disese, RCA ~40-50%ISR  . CAROTID ENDARTERECTOMY Left 05/21/2016  . COLONOSCOPY    . CORONARY ANGIOPLASTY WITH STENT PLACEMENT Right  September 2012   2 overlapping Promus DES 2.7 mm at 12 mm x2; postdilated to 3 mm  . CORONARY ARTERY BYPASS GRAFT N/A 07/01/2013   Procedure: CORONARY ARTERY BYPASS GRAFTING (CABG);  Surgeon: Grace Isaac, MD;  Location: [facility];  Service: Open Heart Surgery;  Laterality: N/A;  Times 3 using left internal mammary artery and endoscopically harvested bilateral saphenous vein  . ENDARTERECTOMY Left 05/21/2016   Procedure: LEFT CAROTID ENDARTERECTOMY;  Surgeon: Elam Dutch, MD;  Location: [facility];  Service: Vascular;  Laterality: Left;  . ENDARTERECTOMY FEMORAL Left 02/23/2018   Procedure: LEFT COMMON FEMORAL ENDARTERECTOMY;  Surgeon: Elam Dutch, MD;  Location: [facility];  Service: Vascular;  Laterality: Left;  . EYE SURGERY Bilateral    ioc for cataracts  . INSERTION OF ILIAC STENT Bilateral 02/23/2018   Procedure: INSERTION OF BILATERAL COMMON ILIAC STENTS WITH VIABAHN STENTS;  Surgeon: Elam Dutch, MD;  Location: [facility];  Service: Vascular;  Laterality: Bilateral;  . INTRAOPERATIVE TRANSESOPHAGEAL ECHOCARDIOGRAM N/A 07/01/2013   Procedure: INTRAOPERATIVE TRANSESOPHAGEAL ECHOCARDIOGRAM;  Surgeon: Grace Isaac, MD;  Location: [facility];  Service: Open Heart Surgery;  Laterality: N/A;  . LEFT AND RIGHT HEART CATHETERIZATION WITH CORONARY/GRAFT ANGIOGRAM N/A 10/03/2014   Procedure: LEFT AND RIGHT HEART CATHETERIZATION WITH Beatrix Fetters;  Surgeon: Leonie Man, MD;  Location: [facility] C[facility];  Service: Cardiovascular;  Laterality: N/A;  . LEFT HEART CATHETERIZATION WITH CORONARY ANGIOGRAM N/A 06/11/2011   Procedure: LEFT HEART CATHETERIZATION WITH CORONARY ANGIOGRAM;  Surgeon: Fulton Reek, MD;  Location: [facility] CATH LAB;  Service: Cardiovascular;  Laterality: N/A;  . LEFT HEART CATHETERIZATION WITH CORONARY ANGIOGRAM N/A 06/28/2013   Procedure: LEFT HEART CATHETERIZATION WITH CORONARY ANGIOGRAM;  Surgeon: Leonie Man, MD;  Location: [facility] CATH LAB;  Service: Cardiovascular;  Laterality:  N/A;  . NM MYOVIEW LTD  January '13   Diaphragmatic attenuation versus inferior infarct. No ischemia. Normal EF normal wall motion.  Marland Kitchen NM MYOVIEW LTD  June 2014   no ischemia  . NM MYOVIEW LTD  10/2014   LEXISCAN: Normal EF, 61%, No ischemia or infarction.  Mild diaphragmatic attenuation  . PATCH ANGIOPLASTY Left 05/21/2016   Procedure: PATCH ANGIOPLASTY USING HEMASHIELD PLATINUM FINESSE 0.3in x 3 in;  Surgeon: Elam Dutch, MD;  Location: [facility];  Service: Vascular;  Laterality: Left;  . PATCH ANGIOPLASTY Left 02/23/2018   Procedure: PATCH ANGIOPLASTY LEFT COMMON FEMORAL ARTERY;  Surgeon: Elam Dutch, MD;  Location: [facility];  Service: Vascular;  Laterality: Left;  . PERCUTANEOUS CORONARY STENT INTERVENTION (PCI-S)  06/11/2011   Procedure: PERCUTANEOUS CORONARY STENT INTERVENTION (PCI-S);  Surgeon: Fulton Reek, MD;  Location: [facility] CATH LAB;  Service: Cardiovascular;;  . PERCUTANEOUS CORONARY STENT INTERVENTION (PCI-S) N/A 10/12/2014   Procedure: PERCUTANEOUS CORONARY STENT INTERVENTION (PCI-S);  Surgeon: Leonie Man, MD;  Location: [facility] CATH LAB;  Ostial-Prox SVG-RCA Xience Alpine DES 3.0 mm x  38 mm; (3.4  - 3.3 mm)  . RETINAL LASER PROCEDURE Bilateral    "more than once"   . TONSILLECTOMY  as child  . TRANSTHORACIC ECHOCARDIOGRAM  03/2017    EF 55-60%.  Normal wall thickness.  No regional wall motion normalities.  GR 2 DD.  Mild-moderate calcified aortic valve.  Mild RV systolic dysfunction.  Moderate RV dilation.  Moderately increased PA pressures.  Lance Collier ECHOCARDIOGRAM  April 2016   Mod focal basal & mild overall LVH. EF 55-60%. Mild Ao Sclerosis, Mild MR with MAC. Only minimally elevated RVP.     Current Outpatient Medications  Medication Sig Dispense Refill  . acetaminophen (TYLENOL) 325 MG tablet Take 650 mg by mouth daily as needed for mild pain or headache.     Marland Kitchen amLODipine (NORVASC) 5 MG tablet Take 5 mg by mouth daily.  5  . aspirin EC 81 MG tablet Take 1  tablet (81 mg total) by mouth every other day. (Patient taking differently: Take 81 mg by mouth See admin instructions. Take 78m once daily on Tues, Thurs, Sat, and Sun)    . carvedilol (COREG) 12.5 MG tablet TAKE 1 TABLET BY MOUTH TWICE DAILY. (Patient taking differently: Take 12.5 mg by mouth 2 (two) times daily. ) 180 tablet 3  . clopidogrel (PLAVIX) 75 MG tablet TAKE 1 TABLET DAILY WITH BREAKFAST. (Patient taking differently: Take 75 mg by mouth daily. ) 90 tablet 2  . docusate sodium (STOOL SOFTENER) 100 MG capsule Take 100 mg by mouth daily as needed for mild constipation.     . Evolocumab (REPATHA SURECLICK) 1681MG/ML SOAJ Inject 140 mg into the skin every 14 (fourteen) days. 2 pen 12  . fenofibrate 160 MG tablet TAKE 1 TABLET ONCE DAILY. 90 tablet 1  . furosemide (LASIX) 40 MG tablet Take 120 mg by mouth 2 (two) times daily.    .Marland KitchenHUMALOG KWIKPEN 200 UNIT/ML SOPN Inject 20-28 Units into the skin See admin instructions. Use 20 units (base) per meal increase units given by 1 unit for every 15 increment over 150 (I.E. Blood glucose reading 165=21 units, 180=22 units, etc. )  5  . hydrALAZINE (APRESOLINE) 100 MG tablet TAKE 1 TABLET BY MOUTH 3 TIMES A DAY. (Patient taking differently: Take 100 mg by mouth 3 (three) times daily. ) 90 tablet 2  . isosorbide mononitrate (IMDUR) 60 MG 24 hr tablet TAKE 1 TABLET ONCE DAILY. (Patient taking differently: Take 60 mg by mouth daily. ) 90 tablet 3  . pantoprazole (PROTONIX) 40 MG tablet TAKE 1 TABLET ONCE DAILY. 90 tablet 1  . rosuvastatin (CRESTOR) 10 MG tablet TAKE 1 TABLET ON TUESDAY, THURSDAY, SATURDAY AND SUNDAY. 51 tablet 0  . TOUJEO MAX SOLOSTAR 300 UNIT/ML SOPN Inject 70 Units into the skin 2 (two) times daily.   1  . VASCEPA 1 G CAPS Take 2 g by mouth 2 (two) times daily.   4   No current facility-administered medications for this visit.     Allergies:   Atorvastatin; Ibuprofen; Pravastatin; and Simvastatin    Social History:  The patient   reports that he quit smoking about 21 years ago. His smoking use included cigarettes. He has a 54.00 pack-year smoking history. He has never used smokeless tobacco. He reports that he drinks alcohol. He reports that he does not use drugs.   Family History:  The patient's family history includes Breast cancer in his mother and sister; Colon polyps in his father; Hyperlipidemia in his father;  Hypertension in his father; Liver cancer in his maternal grandmother; Lung cancer in his sister; Throat cancer in his father.    ROS: All other systems are reviewed and negative. Unless otherwise mentioned in H&P    PHYSICAL EXAM: VS:  BP 108/62   Pulse 62   Ht _0  (1.727 m)   Wt 208 lb (94.3 kg)   BMI 31.63 kg/m  , BMI Body mass index is 31.63 kg/m. GEN: Well nourished, well developed, in no acute distress, obese HEENT: normal Neck: no JVD, carotid bruits, or masses Cardiac: IRRR; no murmurs, rubs, or gallops,no edema  Respiratory:  Clear to auscultation bilaterally, normal work of breathing GI: soft, nontender, nondistended, + BS MS: no deformity or atrophy Skin: warm and dry, no rash Neuro:  Strength and sensation are intact Psych: euthymic mood, full affect   EKG: Sinus rhythm with ventricular bigeminy. HR of 62 bpm.   Recent Labs: 03/01/2018: ALT 18; BUN 40; Creatinine, Ser 2.78; Hemoglobin 6.9; Platelets 266; Potassium 4.0; Sodium 135    Lipid Panel    Component Value Date/Time   CHOL 193 09/20/2014 0838   TRIG 367 (H) 09/20/2014 0838   HDL 22 (L) 09/20/2014 0838   CHOLHDL 8.8 09/20/2014 0838   VLDL 73 (H) 09/20/2014 0838   LDLCALC 98 09/20/2014 0838      Wt Readings from Last 3 Encounters:  04/13/18 208 lb (94.3 kg)  03/24/18 208 lb 1.6 oz (94.4 kg)  03/18/18 211 lb (95.7 kg)      Other studies Reviewed: Echocardiogram 2017/04/12 Left ventricle: The cavity size was normal. Wall thickness was   normal. Systolic function was normal. The estimated ejection   fraction  was in the range of 55% to 60%. Wall motion was normal;   there were no regional wall motion abnormalities. Features are   consistent with a pseudonormal left ventricular filling pattern,   with concomitant abnormal relaxation and increased filling   pressure (grade 2 diastolic dysfunction). - Aortic valve: Mildly to moderately calcified annulus. - Mitral valve: There was mild regurgitation. - Left atrium: The atrium was mildly dilated. - Right ventricle: The cavity size was mildly dilated. Systolic   function was mildly reduced. - Right atrium: The atrium was moderately dilated. - Pulmonary arteries: Systolic pressure was moderately increased.   PA peak pressure: 50 mm Hg (S).  NM Stress Test 05/14/2016 Study Highlights    Nuclear stress EF: 55%.  The left ventricular ejection fraction is normal (55-65%).  There was no ST segment deviation noted during stress.  The study is normal.  This is a low risk study.     ASSESSMENT AND PLAN:  1.  Dyspnea: Uncertain if this is related to his ventricular bigeminy or his weight and inactivity. Will consider repeating his echo and possible stress test (will need to be  2 day due to abdominal girth) after having labs repeated. I will check a BMET, Mg.   2. CAD: He denies chest pain but is more fatigued. Low threshold to repeat stress test if labs are normal.Continue carvedilol, Plavix, ASA 81 mg.   3. Hypertension: BP is low per recording, but this is due to bigeminy. Accuracy is uncertain in this setting. Continue amlodipine, hydralazine and isosorbide. .   4. Hypercholesterolemia:  On rosuvastatin, checking lipids and LFT's.   5. Anemia: Last labs in Epic reported Hgb of 6.9. He states he was given blood transfusion and has had labs checked by PCP, but does not know the  results. Will recheck.  Current medicines are reviewed at length with the patient today.    Labs/ tests ordered today include: BMET, CBC, Lipids and LFT's.  Phill Myron. West Pugh, ANP, AACC   04/13/2018 10:01 AM    [facility] [address] Office [phone] Fax [phone]

## 2018-04-12 NOTE — Telephone Encounter (Signed)
Returned call to patient of Dr. Ellyn Hack who reports SOB for about 5 weeks. He is SOB with walking & going up and down stairs which is a change for him. He reports no energy. He is post-vascular surgery from Sept with healing issues - being treated by Dr. Oneida Alar.   Patient advised that he should be seen for change in status. Scheduled for NP OV on 04/13/18 @ 0830 with Arnold Long DNP.

## 2018-04-12 NOTE — Telephone Encounter (Addendum)
° °  Patient calling with concerns about SOB, patient declined appt on 10/23 with Dr Ellyn Hack. Patient declined to schedule with APP staff   Pt c/o Shortness Of Breath: STAT if SOB developed within the last 24 hours or pt is noticeably SOB on the phone  1. Are you currently SOB (can you hear that pt is SOB on the phone)? no  2. How long have you been experiencing SOB?  5 weeks  3. Are you SOB when sitting or when up moving around? Moving around  4. Are you currently experiencing any other symptoms? NO

## 2018-04-13 ENCOUNTER — Encounter: Payer: Self-pay | Admitting: Adult Health

## 2018-04-13 ENCOUNTER — Ambulatory Visit (INDEPENDENT_AMBULATORY_CARE_PROVIDER_SITE_OTHER): Payer: Medicare Other | Admitting: Adult Health

## 2018-04-13 VITALS — BP 108/62 | HR 62 | Ht 68.0 in | Wt 208.0 lb

## 2018-04-13 DIAGNOSIS — I739 Peripheral vascular disease, unspecified: Secondary | ICD-10-CM

## 2018-04-13 DIAGNOSIS — D649 Anemia, unspecified: Secondary | ICD-10-CM

## 2018-04-13 DIAGNOSIS — E7800 Pure hypercholesterolemia, unspecified: Secondary | ICD-10-CM

## 2018-04-13 DIAGNOSIS — I10 Essential (primary) hypertension: Secondary | ICD-10-CM

## 2018-04-13 DIAGNOSIS — R079 Chest pain, unspecified: Secondary | ICD-10-CM | POA: Diagnosis not present

## 2018-04-13 DIAGNOSIS — I209 Angina pectoris, unspecified: Secondary | ICD-10-CM | POA: Diagnosis not present

## 2018-04-13 DIAGNOSIS — Z79899 Other long term (current) drug therapy: Secondary | ICD-10-CM

## 2018-04-13 LAB — BASIC METABOLIC PANEL
BUN/Creatinine Ratio: 19 (ref 10–24)
BUN: 55 mg/dL — ABNORMAL HIGH (ref 8–27)
CO2: 27 mmol/L (ref 20–29)
CREATININE: 2.86 mg/dL — AB (ref 0.76–1.27)
Calcium: 9.4 mg/dL (ref 8.6–10.2)
Chloride: 96 mmol/L (ref 96–106)
GFR, EST AFRICAN AMERICAN: 24 mL/min/{1.73_m2} — AB (ref >59)
GFR, EST NON AFRICAN AMERICAN: 21 mL/min/{1.73_m2} — AB (ref >59)
Glucose: 159 mg/dL — ABNORMAL HIGH (ref 65–99)
POTASSIUM: 4.8 mmol/L (ref 3.5–5.2)
SODIUM: 140 mmol/L (ref 134–144)

## 2018-04-13 LAB — CBC
HEMATOCRIT: 34.1 % — AB (ref 37.5–51.0)
Hemoglobin: 11.2 g/dL — ABNORMAL LOW (ref 13.0–17.7)
MCH: 28.6 pg (ref 26.6–33.0)
MCHC: 32.8 g/dL (ref 31.5–35.7)
MCV: 87 fL (ref 79–97)
Platelets: 274 10*3/uL (ref 150–450)
RBC: 3.91 x10E6/uL — AB (ref 4.14–5.80)
RDW: 13.9 % (ref 12.3–15.4)
WBC: 7.7 10*3/uL (ref 3.4–10.8)

## 2018-04-13 LAB — MAGNESIUM: Magnesium: 2.5 mg/dL — ABNORMAL HIGH (ref 1.6–2.3)

## 2018-04-13 LAB — TSH: TSH: 4.66 u[IU]/mL — AB (ref 0.450–4.500)

## 2018-04-13 NOTE — Patient Instructions (Signed)
Follow-Up: You will need a follow up appointment in 1 month. You may see  DR Ellyn Hack, -or- one of the following Advanced Practice Providers on your designated Care Team:   . Rosaria Ferries, PA-C . Jory Sims, DNP, ANP  Labwork: BMET,CBC,TSH AND MAG TODAY HERE IN OUR OFFICE If you have labs (blood work) drawn today and your tests are completely normal, you will receive your results ONLY by: . MyChart Message (if you have MyChart) -OR- . A paper copy in the mail  Medication Instructions:  NO CHANGES- Your physician recommends that you continue on your current medications as directed. Please refer to the Current Medication list given to you today.  If you need a refill on your cardiac medications before your next appointment, please call your pharmacy.  At [facility], you and your health needs are our priority.  As part of our continuing mission to provide you with exceptional heart care, we have created designated Provider Care Teams.  These Care Teams include your primary Cardiologist (physician) and Advanced Practice Providers (APPs -  Physician Assistants and Nurse Practitioners) who all work together to provide you with the care you need, when you need it.  Thank you for choosing [facility] at [facility]!!

## 2018-04-14 ENCOUNTER — Encounter: Payer: Self-pay | Admitting: Adult Health

## 2018-04-14 ENCOUNTER — Other Ambulatory Visit: Payer: Self-pay

## 2018-04-14 DIAGNOSIS — N184 Chronic kidney disease, stage 4 (severe): Secondary | ICD-10-CM | POA: Diagnosis not present

## 2018-04-14 DIAGNOSIS — E7849 Other hyperlipidemia: Secondary | ICD-10-CM | POA: Diagnosis not present

## 2018-04-14 DIAGNOSIS — I2510 Atherosclerotic heart disease of native coronary artery without angina pectoris: Secondary | ICD-10-CM

## 2018-04-14 DIAGNOSIS — E1139 Type 2 diabetes mellitus with other diabetic ophthalmic complication: Secondary | ICD-10-CM | POA: Diagnosis not present

## 2018-04-14 DIAGNOSIS — Z6830 Body mass index (BMI) 30.0-30.9, adult: Secondary | ICD-10-CM | POA: Diagnosis not present

## 2018-04-14 DIAGNOSIS — Z9861 Coronary angioplasty status: Principal | ICD-10-CM

## 2018-04-14 DIAGNOSIS — Z794 Long term (current) use of insulin: Secondary | ICD-10-CM | POA: Diagnosis not present

## 2018-04-14 NOTE — Progress Notes (Signed)
Notes recorded by Lendon Colonel, NP on 04/13/2018 at 4:59 PM EDT His kidney disease appears to have worsened by his labs. He will need to follow up with nephrology for further recommendations. Other labs are okay. Anemia is improved, with Hgb of 11.2 and Hct of 34.1. TSH is improved. I would like him to be scheduled for a Lexiscan Myoview to evaluate for ischemia, please. No changes in medications at this time.

## 2018-04-15 ENCOUNTER — Other Ambulatory Visit: Payer: Self-pay

## 2018-04-15 ENCOUNTER — Encounter: Payer: Self-pay | Admitting: Vascular Surgery

## 2018-04-15 ENCOUNTER — Ambulatory Visit (INDEPENDENT_AMBULATORY_CARE_PROVIDER_SITE_OTHER): Payer: Medicare Other | Admitting: Vascular Surgery

## 2018-04-15 VITALS — BP 118/66 | HR 51 | Temp 97.0°F | Resp 18 | Ht 68.0 in | Wt 207.0 lb

## 2018-04-15 DIAGNOSIS — I739 Peripheral vascular disease, unspecified: Secondary | ICD-10-CM

## 2018-04-15 NOTE — Progress Notes (Signed)
April 15, 2018  To whom it may concern:  [name] has been under my care since undergoing an operation to revascularize his left lower extremity due to severe peripheral arterial occlusive disease with obstruction of his iliac and common femoral arteries.  Since that time he has had breakdown of his left groin incision.  It is necessary for him to completely limit his activities including work to assist in healing this left groin wound.  Unnecessary physical activity could compromise healing of this wound with possible limb threatening infection.  In my opinion I do not believe that [name] is currently able to perform his work duties in full capacity until this wound has completely healed.  I will make a reassessment of his ability to perform his work activities in 1 month.  Please feel free to contact me with any further questions regarding this issue.  Ruta Hinds, MD [facility] Office: [phone] Pager: [phone]

## 2018-04-15 NOTE — Progress Notes (Signed)
Patient is a 72 year old male who returns for postoperative follow-up today.  He was last seen March 24, 2018.  He underwent bilateral common iliac stenting left femoral endarterectomy and profundoplasty February 23, 2018.  Has had trouble healing up his left groin incision.  He is doing local wound care with normal saline wet-to-dry.  He was recently placed on Levaquin to cover a Klebsiella bacteria that had grown from the groin.  He states his left leg claudication symptoms are improved.  He does still complain of pain in the right calf.  He previously underwent left carotid endarterectomy in 2017.  Physical exam:  Vitals:   04/15/18 0905  BP: 118/66  Pulse: (!) 51  Resp: 18  Temp: (!) 97 F (36.1 C)  TempSrc: Oral  SpO2: 98%  Weight: 207 lb (93.9 kg)  Height: 5\' 8"  (1.727 m)    Left groin 3 x 3 cm to centimeter depth opening inferior pole of left groin incision no exposed graft no surrounding erythema some clear drainage  Assessment: Slowly healing left groin incision clean at this point no obvious evidence of infection  Plan: Continue local wound care follow-up in 1 month to recheck left groin wound.  He will need to have repeat noninvasive testing in December.   Ruta Hinds, MD [facility] [place] Office: [phone] Pager: [phone]

## 2018-04-20 DIAGNOSIS — E1122 Type 2 diabetes mellitus with diabetic chronic kidney disease: Secondary | ICD-10-CM | POA: Diagnosis not present

## 2018-04-20 DIAGNOSIS — I2510 Atherosclerotic heart disease of native coronary artery without angina pectoris: Secondary | ICD-10-CM | POA: Diagnosis not present

## 2018-04-20 DIAGNOSIS — D631 Anemia in chronic kidney disease: Secondary | ICD-10-CM | POA: Diagnosis not present

## 2018-04-20 DIAGNOSIS — I739 Peripheral vascular disease, unspecified: Secondary | ICD-10-CM | POA: Diagnosis not present

## 2018-04-20 DIAGNOSIS — Z6831 Body mass index (BMI) 31.0-31.9, adult: Secondary | ICD-10-CM | POA: Diagnosis not present

## 2018-04-20 DIAGNOSIS — I129 Hypertensive chronic kidney disease with stage 1 through stage 4 chronic kidney disease, or unspecified chronic kidney disease: Secondary | ICD-10-CM | POA: Diagnosis not present

## 2018-04-20 DIAGNOSIS — N183 Chronic kidney disease, stage 3 (moderate): Secondary | ICD-10-CM | POA: Diagnosis not present

## 2018-04-22 ENCOUNTER — Telehealth: Payer: Self-pay | Admitting: Cardiology

## 2018-04-22 NOTE — Telephone Encounter (Signed)
Received records from [place] on 04/22/18, Appt 05/27/18 @ 8:00AM.NV

## 2018-04-27 ENCOUNTER — Telehealth (HOSPITAL_COMMUNITY): Payer: Self-pay

## 2018-04-27 NOTE — Telephone Encounter (Signed)
Encounter complete. 

## 2018-04-29 ENCOUNTER — Ambulatory Visit (HOSPITAL_COMMUNITY)
Admission: RE | Admit: 2018-04-29 | Discharge: 2018-04-29 | Disposition: A | Discharge status: Home / Self Care | Payer: Medicare Other | Source: Ambulatory Visit | Attending: Cardiology | Admitting: Cardiology

## 2018-04-29 DIAGNOSIS — Z9861 Coronary angioplasty status: Secondary | ICD-10-CM

## 2018-04-29 DIAGNOSIS — I2510 Atherosclerotic heart disease of native coronary artery without angina pectoris: Secondary | ICD-10-CM

## 2018-04-29 HISTORY — PX: NM MYOVIEW LTD: HXRAD82

## 2018-04-29 LAB — MYOCARDIAL PERFUSION IMAGING
Peak HR: 60 {beats}/min
Rest HR: 55 {beats}/min
SDS: 4
SRS: 1
SSS: 5
TID: 1.02

## 2018-04-29 MED ORDER — TECHNETIUM TC 99M TETROFOSMIN IV KIT
11.0000 | PACK | Freq: Once | INTRAVENOUS | Status: AC | PRN
  Administered 2018-04-29: 11 via INTRAVENOUS
  Filled 2018-04-29: qty 11

## 2018-04-29 MED ORDER — TECHNETIUM TC 99M TETROFOSMIN IV KIT
30.9000 | PACK | Freq: Once | INTRAVENOUS | Status: AC | PRN
  Administered 2018-04-29: 30.9 via INTRAVENOUS
  Filled 2018-04-29: qty 31

## 2018-04-29 MED ORDER — REGADENOSON 0.4 MG/5ML IV SOLN
0.4000 mg | Freq: Once | INTRAVENOUS | Status: AC
  Administered 2018-04-29: 0.4 mg via INTRAVENOUS

## 2018-05-13 ENCOUNTER — Other Ambulatory Visit: Payer: Self-pay

## 2018-05-13 ENCOUNTER — Encounter: Payer: Self-pay | Admitting: Vascular Surgery

## 2018-05-13 ENCOUNTER — Ambulatory Visit (INDEPENDENT_AMBULATORY_CARE_PROVIDER_SITE_OTHER): Payer: Self-pay | Admitting: Vascular Surgery

## 2018-05-13 VITALS — BP 160/80 | HR 66 | Temp 97.3°F | Resp 18 | Ht 68.0 in | Wt 208.0 lb

## 2018-05-13 DIAGNOSIS — I739 Peripheral vascular disease, unspecified: Secondary | ICD-10-CM

## 2018-05-13 NOTE — Progress Notes (Signed)
Patient is a 72 year old male who returns for postoperative follow-up today.  He was last seen April 15, 2018.  He previously underwent bilateral common iliac artery stenting with left femoral endarterectomy and profundoplasty February 23, 2018.  He has had trouble healing up his left groin incision.  He is currently doing local wound care normal saline wet-to-dry.  He was placed on a course of Levaquin to cover Klebsiella.  He has completed this.  He has no significant drainage.  He does occasionally have some bleeding from the left groin.  This sounds more like irritation to granulation tissue than a herald bleed.  He still complains of pain in both calves when walking.  Has had no fever or chills.  Of note he previously underwent left carotid endarterectomy in 2017.  Physical exam:  Vitals:   05/13/18 1000  BP: (!) 160/80  Pulse: 66  Resp: 18  Temp: (!) 97.3 F (36.3 C)  SpO2: 95%  Weight: 208 lb (94.3 kg)  Height: 5\' 8"  (1.727 m)    Extremities: Left groin 1 x 1 x 1 cm opening left groin with 100% granulation tissue at the base no exposed graft no significant drainage no obvious signs of recent bleeding  Slowly healing left groin incision approximately 50% smaller than 1 month ago.  Wound is clean with no obvious infection no exposed graft.  Plan: Continue local wound care patient will follow-up in 3 to 5 weeks to recheck his left groin.  Need to repeat his noninvasive testing in the next few months.  Ruta Hinds, MD [facility] [place] Office: [phone] Pager: [phone]

## 2018-05-26 ENCOUNTER — Other Ambulatory Visit: Payer: Self-pay | Admitting: Cardiology

## 2018-05-26 NOTE — Telephone Encounter (Signed)
REFILLED  90 X 3

## 2018-05-27 ENCOUNTER — Encounter: Payer: Self-pay | Admitting: Cardiology

## 2018-05-27 ENCOUNTER — Ambulatory Visit (INDEPENDENT_AMBULATORY_CARE_PROVIDER_SITE_OTHER): Payer: Medicare Other | Admitting: Cardiology

## 2018-05-27 VITALS — BP 128/54 | HR 55 | Ht 68.0 in | Wt 210.0 lb

## 2018-05-27 DIAGNOSIS — Z9861 Coronary angioplasty status: Secondary | ICD-10-CM

## 2018-05-27 DIAGNOSIS — I25709 Atherosclerosis of coronary artery bypass graft(s), unspecified, with unspecified angina pectoris: Secondary | ICD-10-CM | POA: Diagnosis not present

## 2018-05-27 DIAGNOSIS — I2510 Atherosclerotic heart disease of native coronary artery without angina pectoris: Secondary | ICD-10-CM

## 2018-05-27 DIAGNOSIS — E785 Hyperlipidemia, unspecified: Secondary | ICD-10-CM | POA: Diagnosis not present

## 2018-05-27 DIAGNOSIS — I70219 Atherosclerosis of native arteries of extremities with intermittent claudication, unspecified extremity: Secondary | ICD-10-CM

## 2018-05-27 DIAGNOSIS — R0600 Dyspnea, unspecified: Secondary | ICD-10-CM

## 2018-05-27 DIAGNOSIS — E7800 Pure hypercholesterolemia, unspecified: Secondary | ICD-10-CM | POA: Diagnosis not present

## 2018-05-27 DIAGNOSIS — I10 Essential (primary) hypertension: Secondary | ICD-10-CM

## 2018-05-27 DIAGNOSIS — I5032 Chronic diastolic (congestive) heart failure: Secondary | ICD-10-CM | POA: Diagnosis not present

## 2018-05-27 DIAGNOSIS — R0609 Other forms of dyspnea: Secondary | ICD-10-CM | POA: Diagnosis not present

## 2018-05-27 DIAGNOSIS — I209 Angina pectoris, unspecified: Secondary | ICD-10-CM

## 2018-05-27 DIAGNOSIS — Z79899 Other long term (current) drug therapy: Secondary | ICD-10-CM

## 2018-05-27 NOTE — Assessment & Plan Note (Signed)
Overall pretty well controlled on multiple medications.  On max dose hydralazine, amlodipine and max tolerable dose of carvedilol. No change

## 2018-05-27 NOTE — Assessment & Plan Note (Addendum)
This was his anginal equivalent, and he really did not have true angina chest pain.  I think his current progressive dyspnea conditioning and the fatigue related to his claudication.  He has some diastolic dysfunction which could be part of the reason to be ischemic with negative Myoview We have him on pretty aggressive treatment for diastolic heart failure not a lot room to change.

## 2018-05-27 NOTE — Assessment & Plan Note (Addendum)
Significant multi-vascular bed disease with carotids, coronaries and peripheral vascular disease.  Very atherogenic profile. Now on intermittent dosing of Crestor and has just started Repatha in addition to fenofibrate at high dose.Marland Kitchen  Seems to be doing very well with this.  Should be due to have labs checked.  We will get lipid panel checked in the next week or 2.

## 2018-05-27 NOTE — Assessment & Plan Note (Signed)
Almost 5 years post CABG, and 3/2 years since last stent.  Nonischemic Myoview.  I am not convinced it is exertional dyspnea and fatigue is related to coronary disease.  He does have some diastolic dysfunction, but not really having any heart failure symptoms.  Plan: Continue aspirin and Plavix as much for PAD as CAD.    Is on stable dose of carvedilol and amlodipine for blood pressure and angina effect along with isosorbide mononitrate.  Seems to doing well with Repatha plus statin.

## 2018-05-27 NOTE — Progress Notes (Signed)
1 a little bit on and swelling no better   PCP: Reynold Bowen, MD  Clinic Note: Chief Complaint  Patient presents with  . Follow-up    Stress test results  . Edema    Ankles.  . Dizziness    Occasional positional, usually at night when he wakes up  . Shortness of Breath    Stable    HPI: Lance Collier is a 72 y.o. male with a PMH notable for multivessel CAD (began with PCI to RCA followed by CABG then PCI to SVG-RCA), carotid artery disease as well as significant peripheral artery disease with diastolic heart failure, IOX-7-3 as well as diabetes.  He now presents follow-up after visit with concerning symptoms for possible coronary ischemia related exertional dyspnea.   CAD: December 2012: 2 overlapping DES to RCA; January 2015: CABG [facility], SVG-Cx); April 2016 -DES PCI to SVG-RCA.  PAD -most recently left femoral endarterectomy with bilateral common iliac stenting and profundoplasty September 2019 -> unfortunately not much notable improvement in symptoms  Carotid Artery Disease - Left Carotid Endarterectomy November 2017  His last visit with me was in June -was doing relatively well from a cardiology standpoint.  Baseline dyspnea, getting over a cold.  Was considering peripheral vascular intervention attempt.  Really seem to be more limited by claudication than anything else.  Lance Collier was seen by Jory Sims, DNP back on October 22 -> noted feeling tired and easily fatigued walking around.  Also associated with dyspnea.  No chest pain or dizziness.  Was evaluated with Myoview stress test.  Recent Hospitalizations: None   n/a  Studies Personally Reviewed - (if available, images/films reviewed: From Epic Chart or Care Everywhere)  Myoview 04/29/2018: Non-gated study with small fixed moderate intensity defect in the apical inferior wall suspicious for either small area of infarction versus attenuation.  LOW RISK.  Interval History: Lance Collier returns here  today pretty much stable from a cardiac standpoint.  He says that really he is limited walking because of his claudication.  Doing anything is quite a chore for him and therefore he gets short of breath.  He is not having any anginal symptoms with rest or exertion.  He is not noticing dyspnea at first, it is the discomfort in his legs that makes him short of breath.  Also he has not really been doing much for the last year as far as any activity goes and therefore is quite deconditioned.  He is not having any PND, orthopnea and only mild trivial edema. No rapid irregular heartbeats palpitations.  No lightheadedness, dizziness or syncope/near syncope.  No TIA/amaurosis fugax.  He denies any myalgias.  He does say he is urinating quite a bit because of all the Lasix he is taking.  Does not sleep well at night, mostly because he is getting up to urinate.  No hematuria  He states that he is due for a colonoscopy soon, he denies any melena or hematochezia.  He is a little bit upset, because with his now sedentary lifestyle, he has gained weight.  ROS: A comprehensive was performed.  Pertinent positives and negatives as above Review of Systems  Constitutional: Positive for malaise/fatigue (Energy level seems to be okay, just doing anything more than puttering around the house is a chore because of his claudication.). Negative for weight loss.  HENT: Negative for congestion.   Respiratory: Negative for cough and wheezing.   Cardiovascular: Positive for claudication (Pretty much limits anything he does.).  Gastrointestinal: Negative  for abdominal pain and heartburn.  Genitourinary:       Nocturia  Musculoskeletal: Positive for joint pain. Negative for falls.  Neurological: Positive for dizziness (Balance issues.) and tingling (Peripheral neuropathy).  Psychiatric/Behavioral: The patient has insomnia (Just does not sleep well at all.).        He denies depression, but seems to have a somewhat depressed  mood.  A little better today than last visit.  All other systems reviewed and are negative.  I have reviewed and (if needed) personally updated the patient's problem list, medications, allergies, past medical and surgical history, social and family history.   Past Medical History:  Diagnosis Date  . Anemia   . Aortic sclerosis  - without Stenosis  March 2014   Echo: Aortic sclerosis without stenosis. EF 55-60% with normal WM; mild concentric hypertrophy with normal relaxation.  . Arthritis   . CAD (coronary artery disease), autologous vein bypass graft 09/2014   10/03/14: Cath for Class III-IV Angina -- 80% ostial-proximal SVG-RPDA (minimal flow down native PDA from native RCA with 50% proximal and distal, patent LIMA-LAD, SVG-OM with retrograde filling of OM 2. --> 10/12/14: Staged PCI of SVG-RPDA - Xience DES  3.0 mm x 38 mm; (3.4  - 3.3 mm)  . CAD S/P percutaneous coronary angioplasty 06/12/2011; 09/2104   a) 2012: Complex PCI mRCA ([place]) --  2 overlapping Promus element DES stents.; 09/2014:  ost-prox SVG-rPDA - Xience DES 3.0 mm x 38 mm; (3.4  - 3.3 mm)  . CHF (congestive heart failure) ([place])   . Childhood asthma   . CKD (chronic kidney disease), stage III ([place])    "mild; related to diabetes" (10/02/2014)  . Complication of anesthesia    "I've had name recall recognition problems since my last anesthesia"  . DM (diabetes mellitus) type II uncontrolled with eye manifestation ([place]) 2013   Diabetic retinopathy with retinal hemorrhages since;, recurrent in August 2014 treated with Avastin shots  . Dyspnea    mainly with exertion  . GERD (gastroesophageal reflux disease)   . Heart murmur    questionable  . High cholesterol   . Hypertension   . Migraine ~ 2004   "once"  . PAD (peripheral artery disease) (HCC)    with claudication  . PONV (postoperative nausea and vomiting)    s/p tonsillectomy  . 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 07/01/2013 (Cath for Crescendo Unstable Angina) -->  [name]: For LM disease; LIMA-LAD, SVG-RCA, SCG-Cx; b. 09/2014 Cath/Staged PCI: patent LIMA->LAD, VG->OM1/RI, VG->RCA 80% (3.0x38 Xience Alpine DES on 10/12/2014).  . 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  Likely thromboembolic event with PDA occlusion. 06/12/2011   Post Complex PCI, pt states no mi  . Wears glasses    reading    Past Surgical History:  Procedure Laterality Date  . ABDOMINAL AORTOGRAM W/LOWER EXTREMITY N/A 02/05/2018   Procedure: ABDOMINAL AORTOGRAM W/LOWER EXTREMITY;  Surgeon: Elam Dutch, MD;  Location: [place] [facility];  Service: Cardiovascular;  Laterality: N/A;  . CARDIAC CATHETERIZATION  06/28/2013   ostial LM disese, RCA ~40-50%ISR  . COLONOSCOPY    . CORONARY ANGIOPLASTY WITH STENT PLACEMENT Right September 2012   2 overlapping Promus DES 2.7 mm at 12 mm x2; postdilated to 3 mm  . CORONARY ARTERY BYPASS GRAFT N/A 07/01/2013   Procedure: CORONARY ARTERY BYPASS GRAFTING (CABG);  Surgeon: Grace Isaac, MD;  Location: [place];  Service: Open Heart Surgery;  Laterality: N/A;  Times 3 using left internal mammary artery and endoscopically harvested bilateral saphenous vein  . ENDARTERECTOMY Left 05/21/2016   Procedure: LEFT CAROTID ENDARTERECTOMY;  Surgeon: Elam Dutch, MD;  Location: [place];  Service: Vascular;  Laterality: Left;  . ENDARTERECTOMY FEMORAL Left 02/23/2018   Procedure: LEFT COMMON FEMORAL ENDARTERECTOMY;  Surgeon: Elam Dutch, MD;  Location: [facility];  Service: Vascular;  Laterality: Left;  . EYE SURGERY Bilateral    ioc for cataracts  . INSERTION OF ILIAC STENT Bilateral 02/23/2018   Procedure: INSERTION OF BILATERAL COMMON ILIAC STENTS WITH VIABAHN STENTS;  Surgeon: Elam Dutch, MD;  Location: [place];  Service: Vascular;  Laterality: Bilateral;  . INTRAOPERATIVE TRANSESOPHAGEAL ECHOCARDIOGRAM N/A 07/01/2013   Procedure: INTRAOPERATIVE TRANSESOPHAGEAL ECHOCARDIOGRAM;  Surgeon: Grace Isaac, MD;  Location: [facility];  Service: Open Heart Surgery;  Laterality: N/A;  . LEFT AND RIGHT HEART CATHETERIZATION WITH CORONARY/GRAFT ANGIOGRAM N/A 10/03/2014   Procedure: LEFT AND RIGHT HEART CATHETERIZATION WITH Beatrix Fetters;  Surgeon: Leonie Man, MD;  Location: [facility] CATH LAB;  Service: Cardiovascular;  Laterality: N/A;  . LEFT HEART CATHETERIZATION WITH CORONARY ANGIOGRAM N/A 06/11/2011   Procedure: LEFT HEART CATHETERIZATION WITH CORONARY ANGIOGRAM;  Surgeon: Fulton Reek, MD;  Location: [facility] CATH LAB;  Service: Cardiovascular;  Laterality: N/A;  . LEFT HEART CATHETERIZATION WITH CORONARY ANGIOGRAM N/A 06/28/2013   Procedure: LEFT HEART CATHETERIZATION WITH CORONARY ANGIOGRAM;  Surgeon: Leonie Man, MD;  Location: [facility] CATH LAB;  Service: Cardiovascular;  Laterality: N/A;  . NM MYOVIEW LTD  04/29/2018   LEXISCAN: Non-gated study with small fixed moderate intensity defect in the apical inferior wall suspicious for either small area of infarction versus attenuation (no change from previous study).  LOW RISK.   Marland Kitchen PATCH ANGIOPLASTY Left 05/21/2016   Procedure: PATCH ANGIOPLASTY USING HEMASHIELD PLATINUM FINESSE 0.3in x 3 in;  Surgeon: Elam Dutch, MD;  Location: [place];  Service: Vascular;  Laterality: Left;  . PATCH ANGIOPLASTY Left 02/23/2018   Procedure: PATCH ANGIOPLASTY LEFT COMMON FEMORAL ARTERY;  Surgeon: Elam Dutch, MD;  Location: [place];  Service: Vascular;  Laterality: Left;  . PERCUTANEOUS CORONARY STENT INTERVENTION (PCI-S)  06/11/2011   Procedure: PERCUTANEOUS CORONARY STENT INTERVENTION (PCI-S);  Surgeon: Fulton Reek, MD;  Location: [facility] CATH LAB;  Service: Cardiovascular;;  . PERCUTANEOUS CORONARY STENT INTERVENTION (PCI-S) N/A 10/12/2014   Procedure: PERCUTANEOUS CORONARY STENT INTERVENTION (PCI-S);  Surgeon: Leonie Man, MD;  Location: [facility] CATH LAB;  Ostial-Prox SVG-RCA Xience Alpine DES 3.0 mm x 38 mm; (3.4  - 3.3 mm)  . RETINAL LASER PROCEDURE  Bilateral    "more than once"   . TONSILLECTOMY  as child  . TRANSTHORACIC ECHOCARDIOGRAM  03/2017    EF 55-60%.  Normal wall thickness.  No regional wall motion normalities.  GR 2 DD.  Mild-moderate calcified aortic valve.  Mild RV systolic dysfunction.  Moderate RV dilation.  Moderately increased PA pressures.    Current Meds  Medication Sig  . acetaminophen (TYLENOL) 325 MG tablet Take 650 mg by mouth daily as needed for mild pain or headache.   Marland Kitchen amLODipine (NORVASC) 5 MG tablet Take 5 mg by mouth daily.  Marland Kitchen aspirin EC 81 MG tablet Take 1 tablet (81 mg total) by mouth every other day. (Patient taking differently: Take 81 mg by mouth See admin instructions. Take 81mg  once daily on Tues, Thurs, Sat, and Sun)  . carvedilol (COREG) 12.5 MG tablet TAKE 1 TABLET  BY MOUTH TWICE DAILY. (Patient taking differently: Take 12.5 mg by mouth 2 (two) times daily. )  . clopidogrel (PLAVIX) 75 MG tablet TAKE 1 TABLET DAILY WITH BREAKFAST.  Marland Kitchen docusate sodium (STOOL SOFTENER) 100 MG capsule Take 100 mg by mouth daily as needed for mild constipation.   . Evolocumab (REPATHA SURECLICK) 626 MG/ML SOAJ Inject 140 mg into the skin every 14 (fourteen) days.  . fenofibrate 160 MG tablet TAKE 1 TABLET ONCE DAILY.  . furosemide (LASIX) 40 MG tablet Take 120 mg by mouth 2 (two) times daily.  Marland Kitchen HUMALOG KWIKPEN 200 UNIT/ML SOPN Inject 20-28 Units into the skin See admin instructions. Use 20 units (base) per meal increase units given by 1 unit for every 15 increment over 150 (I.E. Blood glucose reading 165=21 units, 180=22 units, etc. )  . hydrALAZINE (APRESOLINE) 100 MG tablet TAKE 1 TABLET BY MOUTH 3 TIMES A DAY. (Patient taking differently: Take 100 mg by mouth 3 (three) times daily. )  . isosorbide mononitrate (IMDUR) 60 MG 24 hr tablet TAKE 1 TABLET ONCE DAILY. (Patient taking differently: Take 60 mg by mouth daily. )  . levofloxacin (LEVAQUIN) 500 MG tablet TAKE 1 TABLET DAILY UNTIL GONE  . pantoprazole (PROTONIX) 40  MG tablet TAKE 1 TABLET ONCE DAILY.  . rosuvastatin (CRESTOR) 10 MG tablet TAKE 1 TABLET ON TUESDAY, THURSDAY, SATURDAY AND SUNDAY.  Marland Kitchen TOUJEO MAX SOLOSTAR 300 UNIT/ML SOPN Inject 70 Units into the skin 2 (two) times daily.   Marland Kitchen VASCEPA 1 G CAPS Take 2 g by mouth 2 (two) times daily.     Allergies  Allergen Reactions  . Atorvastatin Other (See Comments)    Leg pain  . Ibuprofen Hives  . Pravastatin Other (See Comments)    Leg pain  . Simvastatin Other (See Comments)    Leg pain    Social History   Tobacco Use  . Smoking status: Former Smoker    Packs/day: 2.00    Years: 27.00    Pack years: 54.00    Types: Cigarettes    Last attempt to quit: 11/23/1996    Years since quitting: 21.5  . Smokeless tobacco: Never Used  Substance Use Topics  . Alcohol use: Yes    Comment: rarely  . 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 He notably has not been exercising like he used to. He does walk back and [place]. He has social alcoholic beverages but is trying to cut that back and does not smoke.       He tells me he is a sucker for chips but does not eat sweets because of diabetes.     family history includes Breast cancer in his mother and sister; Colon polyps in his father; Hyperlipidemia in his father; Hypertension in his father; Liver cancer in his maternal grandmother; Lung cancer in his sister; Throat cancer in his father.  Wt Readings from Last 3 Encounters:  05/27/18 210 lb (95.3 kg)  05/13/18 208 lb (94.3 kg)  04/29/18 207 lb (93.9 kg)     PHYSICAL EXAM BP (!) 128/54 (BP Location: Left Arm, Patient Position: Sitting, Cuff Size: Normal)   Pulse (!) 55   Ht 5\' 8"  (1.727 m)   Wt 210 lb (  95.3 kg)   BMI 31.93 kg/m  Physical Exam  Constitutional: He is oriented  to person, place, and time. He appears well-developed and well-nourished.  Mild to moderately obese (truncal). Well-groomed.  Starting to look older than his stated age.  HENT:  Head: Normocephalic and atraumatic.  Left eye is still mildly bloodshot, but no longer puffy/swollen.  Eyes:  Eyes look both relatively clear bilaterally  Neck: Normal range of motion. Neck supple. No hepatojugular reflux (Trace) and no JVD present. Carotid bruit is present (Bilateral).  Cardiovascular: Normal rate. A regularly irregular rhythm present. Frequent extrasystoles are present. PMI is not displaced. Exam reveals distant heart sounds and decreased pulses (Bilateral pedal pulses). Exam reveals no gallop.  Murmur heard.  Low-pitched harsh crescendo-decrescendo early systolic murmur is present with a grade of 2/6. High-pitched blowing early systolic murmur is also present at the apex radiating to the apex. Pulses:      Femoral pulses are on the right side with bruit, and on the left side with bruit. Normal S1 and physiologically split S2.  Pulmonary/Chest: Breath sounds normal. No respiratory distress. He has no wheezes. He has no rales.  Mildly diminished basal sounds, but likely reduced effort.  Abdominal: Soft. Bowel sounds are normal. He exhibits no distension. There is no tenderness. There is no rebound.  Truncal obesity, no HSM  Musculoskeletal: Normal range of motion. He exhibits edema (Trivial bilateral).  Slow, deliberate gait.  Neurological: He is alert and oriented to person, place, and time.  Psychiatric: He has a normal mood and affect. His behavior is normal. Judgment and thought content normal.  As usual he seems to have a somewhat subdued mood. Not depressed however     Adult ECG Report n/a  Other studies Reviewed: Additional studies/ records that were reviewed today include:  Recent Labs: last labs available -    Oct 29, 2017: Total cholesterol 232,Triglycerides 418, LDL 126 and HDL  22    ASSESSMENT / PLAN: Problem List Items Addressed This Visit    Angina, class I ([place]) (Chronic)    If anything, I would be related to microvascular disease since he had negative stress test.  He is on pretty aggressive antianginal regimen.   With his CKD and now negative Myoview, would not consider Ranexa.      Atherosclerotic peripheral vascular disease with intermittent claudication (HCC) (Chronic)    He has had iliac stenting and femoral endarterectomy, but really does not notice too much change in his claudication because of his below the knee disease.  Peripheral runoff is just poor therefore his calfs will limit his walking.  This is really pretty much is limiting feature.  I do not think he did well with Pletal in the past.  He is on as bad as aggressive medical management which he needs we can get him on besides increasing his isosorbide nitrate.  Unfortunately, he really just needs to keep working on walking hurts and he needs to walk stop and walk and stop.      CAD S/P PCI SVG-RCA Xience Alpine DES 3.0 mm x 38 mm (ostial-prox) - 3.40mm (Chronic)    DES and SVG to RCA.  Nonischemic Myoview just performed. Remains on aspirin and Plavix which I think at this point is as much for his recent iliac stents and femoral endarterectomy. Would defer to Dr. feels as far as the timing of if we went not he can stop antiplatelet agents for his upcoming colonoscopy.  Is on statin plus  Repatha as well as beta-blocker, calcium channel blocker and Imdur.      Chronic diastolic CHF (congestive heart failure), NYHA class 2 (HCC) (Chronic)    Pretty much stable class II at the most symptoms.  I think his dyspnea is as much related to deconditioning as it is diastolic dysfunction.  He is on hydralazine-nitrate as well as max tolerated carvedilol dose plus amlodipine.  Is on relatively aggressive diuretic regimen to 120 mg twice daily.  He is euvolemic on exam with very trivial edema.  At this point  will defer to nephrology as far as dosing of his diuretic but he seems relatively euvolemic and stable.      Coronary artery disease involving coronary bypass graft with angina pectoris ([place]) - Primary (Chronic)    Almost 5 years post CABG, and 3/2 years since last stent.  Nonischemic Myoview.  I am not convinced it is exertional dyspnea and fatigue is related to coronary disease.  He does have some diastolic dysfunction, but not really having any heart failure symptoms.  Plan: Continue aspirin and Plavix as much for PAD as CAD.    Is on stable dose of carvedilol and amlodipine for blood pressure and angina effect along with isosorbide mononitrate.  Seems to doing well with Repatha plus statin.      Relevant Orders   Comprehensive metabolic panel   DOE (dyspnea on exertion) (Chronic)    This was his anginal equivalent, and he really did not have true angina chest pain.  I think his current progressive dyspnea conditioning and the fatigue related to his claudication.  He has some diastolic dysfunction which could be part of the reason to be ischemic with negative Myoview We have him on pretty aggressive treatment for diastolic heart failure not a lot room to change.      Essential hypertension (Chronic)    Overall pretty well controlled on multiple medications.  On max dose hydralazine, amlodipine and max tolerable dose of carvedilol. No change      Hyperlipidemia with target LDL less than 70; intolerance to atorvastatin and simvastatin (Chronic)    Significant multi-vascular bed disease with carotids, coronaries and peripheral vascular disease.  Very atherogenic profile. Now on intermittent dosing of Crestor and has just started Repatha in addition to fenofibrate at high dose.Marland Kitchen  Seems to be doing very well with this.  Should be due to have labs checked.  We will get lipid panel checked in the next week or 2.      Relevant Orders   Lipid panel   Comprehensive metabolic panel      Other Visit Diagnoses    Medication management       Relevant Orders   Lipid panel   Comprehensive metabolic panel   Hypercholesterolemia       Relevant Orders   Lipid panel   Comprehensive metabolic panel      Lance Collier is very complicated with several cardiac issues and severely elevated cholesterol levels.  I spent close to 45 minutes with the patient.  >50% of the time was spent in direct patient consultation.  Current medicines are reviewed at length with the patient today. (+/- concerns) n/a The following changes have been made: n/a  Patient Instructions  Medication Instructions:  NO CHANGES If you need a refill on your cardiac medications before your next appointment, please call your pharmacy.   Lab work: LIVER PANEL  LIPIDS please do labs  Follow instruction that is accompanying this summary.  If  you have labs (blood work) drawn today and your tests are completely normal, you will receive your results only by: Marland Kitchen MyChart Message (if you have MyChart) OR . A paper copy in the mail If you have any lab test that is abnormal or we need to change your treatment, we will call you to review the results.  Testing/Procedures: NOT NEEDED Follow-Up: At [facility], you and your health needs are our priority.  As part of our continuing mission to provide you with exceptional heart care, we have created designated Provider Care Teams.  These Care Teams include your primary Cardiologist (physician) and Advanced Practice Providers (APPs -  Physician Assistants and Nurse Practitioners) who all work together to provide you with the care you need, when you need it. .   Any Other Special Instructions Will Be Listed Below (If Applicable).      Studies Ordered:   Orders Placed This Encounter  Procedures  . Lipid panel  . Comprehensive metabolic panel      Glenetta Hew, M.D., M.S. Interventional Cardiologist   Pager # [phone] Phone # [phone] [address]. [place], [place] [address]

## 2018-05-27 NOTE — Assessment & Plan Note (Signed)
DES and SVG to RCA.  Nonischemic Myoview just performed. Remains on aspirin and Plavix which I think at this point is as much for his recent iliac stents and femoral endarterectomy. Would defer to Dr. feels as far as the timing of if we went not he can stop antiplatelet agents for his upcoming colonoscopy.  Is on statin plus Repatha as well as beta-blocker, calcium channel blocker and Imdur.

## 2018-05-27 NOTE — Assessment & Plan Note (Signed)
If anything, I would be related to microvascular disease since he had negative stress test.  He is on pretty aggressive antianginal regimen.   With his CKD and now negative Myoview, would not consider Ranexa.

## 2018-05-27 NOTE — Assessment & Plan Note (Signed)
Pretty much stable class II at the most symptoms.  I think his dyspnea is as much related to deconditioning as it is diastolic dysfunction.  He is on hydralazine-nitrate as well as max tolerated carvedilol dose plus amlodipine.  Is on relatively aggressive diuretic regimen to 120 mg twice daily.  He is euvolemic on exam with very trivial edema.  At this point will defer to nephrology as far as dosing of his diuretic but he seems relatively euvolemic and stable.

## 2018-05-27 NOTE — Assessment & Plan Note (Addendum)
He has had iliac stenting and femoral endarterectomy, but really does not notice too much change in his claudication because of his below the knee disease.  Peripheral runoff is just poor therefore his calfs will limit his walking.  This is really pretty much is limiting feature.  I do not think he did well with Pletal in the past.  He is on as bad as aggressive medical management which he needs we can get him on besides increasing his isosorbide nitrate.  Unfortunately, he really just needs to keep working on walking hurts and he needs to walk stop and walk and stop.

## 2018-05-27 NOTE — Patient Instructions (Addendum)
Medication Instructions:  NO CHANGES If you need a refill on your cardiac medications before your next appointment, please call your pharmacy.   Lab work: LIVER PANEL  LIPIDS please do labs  Follow instruction that is accompanying this summary.  If you have labs (blood work) drawn today and your tests are completely normal, you will receive your results only by: Marland Kitchen MyChart Message (if you have MyChart) OR . A paper copy in the mail If you have any lab test that is abnormal or we need to change your treatment, we will call you to review the results.  Testing/Procedures: NOT NEEDED Follow-Up: At [facility], you and your health needs are our priority.  As part of our continuing mission to provide you with exceptional heart care, we have created designated Provider Care Teams.  These Care Teams include your primary Cardiologist (physician) and Advanced Practice Providers (APPs -  Physician Assistants and Nurse Practitioners) who all work together to provide you with the care you need, when you need it. .   Any Other Special Instructions Will Be Listed Below (If Applicable).

## 2018-06-07 DIAGNOSIS — M1711 Unilateral primary osteoarthritis, right knee: Secondary | ICD-10-CM | POA: Diagnosis not present

## 2018-06-08 ENCOUNTER — Other Ambulatory Visit: Payer: Self-pay | Admitting: Cardiology

## 2018-06-10 ENCOUNTER — Encounter: Payer: Self-pay | Admitting: Vascular Surgery

## 2018-06-10 ENCOUNTER — Other Ambulatory Visit: Payer: Self-pay

## 2018-06-10 ENCOUNTER — Ambulatory Visit (INDEPENDENT_AMBULATORY_CARE_PROVIDER_SITE_OTHER): Payer: Medicare Other | Admitting: Vascular Surgery

## 2018-06-10 VITALS — BP 149/65 | HR 50 | Resp 18 | Ht 68.0 in | Wt 210.0 lb

## 2018-06-10 DIAGNOSIS — I739 Peripheral vascular disease, unspecified: Secondary | ICD-10-CM | POA: Diagnosis not present

## 2018-06-10 DIAGNOSIS — I209 Angina pectoris, unspecified: Secondary | ICD-10-CM

## 2018-06-10 NOTE — Progress Notes (Signed)
Patient is a 72 year old male who returns for follow-up today.  He was last seen November 21.  He underwent bilateral common iliac artery stenting with left common femoral endarterectomy and profundoplasty February 23, 2018.  His left groin incision had difficulty healing up.  He states today that it is now completely healed.  He states he still has early fatigue in both calves when walking.  He is not interested in any further operations.  Of note he previously underwent left carotid endarterectomy 2017.  View of systems: He has shortness of breath with exertion.  He denies chest pain.  Physical exam:  Vitals:   06/10/18 0856  BP: (!) 149/65  Pulse: (!) 50  Resp: 18  SpO2: 94%  Weight: 210 lb (95.3 kg)  Height: 5\' 8"  (1.727 m)    Extremities: Well-healed left groin incision no palpable pedal pulses bilaterally callus left fifth metatarsal plantar aspect no open ulceration or wound  Chest: Clear to auscultation bilaterally  Cardiac: Regular rate and rhythm  Data: Patient's last carotid duplex was July 2019 no significant carotid stenosis.  ABIs March 18, 2018 were 0.6 on the right 0.35 on the left  Assessment: Slowly improving from previous bilateral common iliac stenting and left common femoral endarterectomy with profundoplasty.  Left groin incision now well-healed.  Plan: The patient will follow-up in 3 months time with bilateral lower extremity arterial duplex and ABIs.  He will also have a repeat carotid duplex scan.  He will try to walk as much as possible daily to improve his leg symptoms.  Ruta Hinds, MD [facility] Office: [phone] Pager: [phone]

## 2018-06-14 DIAGNOSIS — L820 Inflamed seborrheic keratosis: Secondary | ICD-10-CM | POA: Diagnosis not present

## 2018-06-14 DIAGNOSIS — M25561 Pain in right knee: Secondary | ICD-10-CM | POA: Diagnosis not present

## 2018-06-15 ENCOUNTER — Telehealth: Payer: Self-pay | Admitting: *Deleted

## 2018-06-15 DIAGNOSIS — M1711 Unilateral primary osteoarthritis, right knee: Secondary | ICD-10-CM | POA: Diagnosis not present

## 2018-06-15 NOTE — Telephone Encounter (Signed)
   [facility] Pre-operative Risk Assessment    Request for surgical clearance:  1. What type of surgery is being performed? Right knee scope menisectomy   2. When is this surgery scheduled? TBD   3. What type of clearance is required (medical clearance vs. Pharmacy clearance to hold med vs. Both)? both  4. Are there any medications that need to be held prior to surgery and how long? Plavix, ASA   5. Practice name and name of physician performing surgery? [facility] Dr. French Ana   6. What is your office phone number [phone]    7.   What is your office fax number [phone] attn: Kelly  8.   Anesthesia type (None, local, MAC, general) ?    Lance Collier A [place] 06/15/2018, 11:41 AM  _________________________________________________________________   (provider comments below)

## 2018-06-15 NOTE — Telephone Encounter (Signed)
   Primary Cardiologist: Glenetta Hew, MD  Chart reviewed as part of pre-operative protocol coverage. Given past medical history and time since last visit, based on ACC/AHA guidelines, Lance Collier would be at acceptable risk for the planned procedure without further cardiovascular testing (recent low risk stress test 04/29/2018).    **Awaiting rec re: plavix from Dr. Ellyn Hack**  I will route this recommendation to the requesting party via [place] fax function and remove from pre-op pool.  Please call with questions.  [name], NP 06/15/2018, 5:01 PM

## 2018-06-15 NOTE — Telephone Encounter (Signed)
So - he just had a PV procedure done -- I'm OK from a Cardiac standpoint, but we need to check with Dr. Oneida Alar.  Glenetta Hew, MD

## 2018-06-17 NOTE — Telephone Encounter (Signed)
   Primary Cardiologist: Glenetta Hew, MD  Chart reviewed as part of pre-operative protocol coverage. Given past medical history and time since last visit, based on ACC/AHA guidelines, Lance Collier would be at acceptable risk for the planned procedure without further cardiovascular testing.   **Pt is on ASA/Plavix s/p recent peripheral arterial stenting in 02/2018.  We will defer decision re: coming off of plavix to his vascular surgeon (Dr. Ruta Hinds).  Please call with questions.  [name], NP 06/17/2018, 7:54 AM

## 2018-06-20 ENCOUNTER — Other Ambulatory Visit: Payer: Self-pay | Admitting: Cardiology

## 2018-06-28 DIAGNOSIS — E1122 Type 2 diabetes mellitus with diabetic chronic kidney disease: Secondary | ICD-10-CM | POA: Diagnosis not present

## 2018-06-28 DIAGNOSIS — I129 Hypertensive chronic kidney disease with stage 1 through stage 4 chronic kidney disease, or unspecified chronic kidney disease: Secondary | ICD-10-CM | POA: Diagnosis not present

## 2018-06-28 DIAGNOSIS — I2510 Atherosclerotic heart disease of native coronary artery without angina pectoris: Secondary | ICD-10-CM | POA: Diagnosis not present

## 2018-06-28 DIAGNOSIS — Z6831 Body mass index (BMI) 31.0-31.9, adult: Secondary | ICD-10-CM | POA: Diagnosis not present

## 2018-06-28 DIAGNOSIS — N183 Chronic kidney disease, stage 3 (moderate): Secondary | ICD-10-CM | POA: Diagnosis not present

## 2018-06-28 DIAGNOSIS — D631 Anemia in chronic kidney disease: Secondary | ICD-10-CM | POA: Diagnosis not present

## 2018-06-28 DIAGNOSIS — I739 Peripheral vascular disease, unspecified: Secondary | ICD-10-CM | POA: Diagnosis not present

## 2018-06-30 DIAGNOSIS — Z79899 Other long term (current) drug therapy: Secondary | ICD-10-CM | POA: Diagnosis not present

## 2018-06-30 DIAGNOSIS — E785 Hyperlipidemia, unspecified: Secondary | ICD-10-CM | POA: Diagnosis not present

## 2018-06-30 DIAGNOSIS — E7800 Pure hypercholesterolemia, unspecified: Secondary | ICD-10-CM | POA: Diagnosis not present

## 2018-06-30 DIAGNOSIS — I25709 Atherosclerosis of coronary artery bypass graft(s), unspecified, with unspecified angina pectoris: Secondary | ICD-10-CM | POA: Diagnosis not present

## 2018-06-30 LAB — LIPID PANEL
CHOL/HDL RATIO: 11.2 ratio — AB (ref 0.0–5.0)
Cholesterol, Total: 280 mg/dL — ABNORMAL HIGH (ref 100–199)
HDL: 25 mg/dL — AB (ref >39)
Triglycerides: 466 mg/dL — ABNORMAL HIGH (ref 0–149)

## 2018-06-30 LAB — COMPREHENSIVE METABOLIC PANEL
ALBUMIN: 4.2 g/dL (ref 3.5–4.8)
ALK PHOS: 73 IU/L (ref 39–117)
ALT: 30 IU/L (ref 0–44)
AST: 28 IU/L (ref 0–40)
Albumin/Globulin Ratio: 1.8 (ref 1.2–2.2)
BUN / CREAT RATIO: 18 (ref 10–24)
BUN: 42 mg/dL — ABNORMAL HIGH (ref 8–27)
CO2: 24 mmol/L (ref 20–29)
CREATININE: 2.32 mg/dL — AB (ref 0.76–1.27)
Calcium: 9.8 mg/dL (ref 8.6–10.2)
Chloride: 96 mmol/L (ref 96–106)
GFR, EST AFRICAN AMERICAN: 31 mL/min/{1.73_m2} — AB (ref >59)
GFR, EST NON AFRICAN AMERICAN: 27 mL/min/{1.73_m2} — AB (ref >59)
GLUCOSE: 163 mg/dL — AB (ref 65–99)
Globulin, Total: 2.4 g/dL (ref 1.5–4.5)
Potassium: 4.2 mmol/L (ref 3.5–5.2)
Sodium: 136 mmol/L (ref 134–144)
TOTAL PROTEIN: 6.6 g/dL (ref 6.0–8.5)

## 2018-07-06 ENCOUNTER — Telehealth: Payer: Self-pay | Admitting: *Deleted

## 2018-07-06 NOTE — Telephone Encounter (Signed)
LEFT MESSAGE  TO CAL BACK = GIVE RESULTS OF LIPID AND NEEDS FOLLOW UP APPOINTMENT WITH CVRR - CHOLESTEROL - , TRIG [phone]

## 2018-07-06 NOTE — Telephone Encounter (Signed)
Called patient, advised of lab work and made appointment for CVRR.  Patient verbalized understanding.

## 2018-07-06 NOTE — Telephone Encounter (Signed)
Follow Up:; ° ° °Returning your call. °

## 2018-07-06 NOTE — Telephone Encounter (Signed)
-----   Message from Leonie Man, MD sent at 06/30/2018  6:03 PM EST ----- Unfortunately, cholesterol level is still out of control with total cholesterol of 280, HDL of 25 and LDL that cannot be calculated because of elevated triglycerides.  This is in the setting of intermittent Crestor, Vascepa and fenofibrate.  He also do started Repatha.  Needs to be seen in lipid clinic -> may potentially enlist the assistance of Dr. Debara Pickett.  Glenetta Hew, MD

## 2018-07-08 ENCOUNTER — Other Ambulatory Visit: Payer: Self-pay | Admitting: Cardiology

## 2018-07-13 DIAGNOSIS — Z23 Encounter for immunization: Secondary | ICD-10-CM | POA: Diagnosis not present

## 2018-07-13 DIAGNOSIS — N184 Chronic kidney disease, stage 4 (severe): Secondary | ICD-10-CM | POA: Diagnosis not present

## 2018-07-13 DIAGNOSIS — Z794 Long term (current) use of insulin: Secondary | ICD-10-CM | POA: Diagnosis not present

## 2018-07-13 DIAGNOSIS — E119 Type 2 diabetes mellitus without complications: Secondary | ICD-10-CM | POA: Diagnosis not present

## 2018-07-13 DIAGNOSIS — I10 Essential (primary) hypertension: Secondary | ICD-10-CM | POA: Diagnosis not present

## 2018-07-14 ENCOUNTER — Other Ambulatory Visit: Payer: Self-pay | Admitting: Cardiology

## 2018-07-20 ENCOUNTER — Ambulatory Visit (INDEPENDENT_AMBULATORY_CARE_PROVIDER_SITE_OTHER): Payer: Medicare Other | Admitting: Pharmacist Clinician (PhC)/ Clinical Pharmacy Specialist

## 2018-07-20 DIAGNOSIS — E785 Hyperlipidemia, unspecified: Secondary | ICD-10-CM | POA: Diagnosis not present

## 2018-07-20 NOTE — Progress Notes (Signed)
07/22/2018 Lance Collier 04-10-1946 [id number]   HPI:  Lance Collier is a 73 y.o. male patient of Dr Ellyn Hack, who presents today for a lipid clinic evaluation.  In addition to hyperlipidemia, his medical history is significant for bilateral coronary artery occlusion (endarterectomy), , CAD (RCA in stent re-stenosis, CABG), PAD (Left ICA 60-79%), hypertension, chronic diastolic HF, anemia, DM2 (A1c 6.7), and CKD (stage 2).  Patient has been on Repatha for the past year, but admits to poor compliance.  He cannot remember to do the injection every two weeks, despite putting notes on his calendar.  He states compliance with all of his daily medications.     Current Medications:   Vascepa 2 gm bid  Fenofibrate  160 mg qd  Rosuvastatin 10 mg daily  Evolocumab 140 mg q2wk (averages 1 dose per month)  Cholesterol Goals: LDL N 70   Social history  Quit tobacco about 21 years ago; only rare alcohol  Family history:   Father and mother were 50 and 42 both deceased from cancer  1 sister, also died of cancer  4 kids, 1 son had stroke around age 68, doing well now  Diet: more fish than previously, beef only 1-2 times per week, more chicken and vegetarian meals, lots of fruit; occasional chips and salsa  Exercise:  Not much d/t leg pain, knee problems  Labs:  06/2018:  TC 280, TG 466, HDL 25, LDL not calculated   Current Outpatient Medications  Medication Sig Dispense Refill  . acetaminophen (TYLENOL) 325 MG tablet Take 650 mg by mouth daily as needed for mild pain or headache.     Marland Kitchen amLODipine (NORVASC) 5 MG tablet Take 5 mg by mouth daily.  5  . aspirin EC 81 MG tablet Take 1 tablet (81 mg total) by mouth every other day. (Patient taking differently: Take 81 mg by mouth See admin instructions. Take 81mg  once daily on Tues, Thurs, Sat, and Sun)    . carvedilol (COREG) 12.5 MG tablet TAKE 1 TABLET BY MOUTH TWICE DAILY. (Patient taking differently: Take 12.5 mg by mouth 2 (two) times daily.  ) 180 tablet 3  . clopidogrel (PLAVIX) 75 MG tablet TAKE 1 TABLET DAILY WITH BREAKFAST. 90 tablet 3  . docusate sodium (STOOL SOFTENER) 100 MG capsule Take 100 mg by mouth daily as needed for mild constipation.     . Evolocumab (REPATHA SURECLICK) 287 MG/ML SOAJ Inject 140 mg into the skin every 14 (fourteen) days. 2 pen 12  . fenofibrate 160 MG tablet TAKE 1 TABLET ONCE DAILY. 90 tablet 1  . furosemide (LASIX) 40 MG tablet Take 120 mg by mouth 2 (two) times daily.    Marland Kitchen HUMALOG KWIKPEN 200 UNIT/ML SOPN Inject 20-28 Units into the skin See admin instructions. Use 20 units (base) per meal increase units given by 1 unit for every 15 increment over 150 (I.E. Blood glucose reading 165=21 units, 180=22 units, etc. )  5  . hydrALAZINE (APRESOLINE) 100 MG tablet TAKE 1 TABLET BY MOUTH 3 TIMES A DAY. 90 tablet 1  . isosorbide mononitrate (IMDUR) 60 MG 24 hr tablet TAKE 1 TABLET ONCE DAILY. (Patient taking differently: Take 60 mg by mouth daily. ) 90 tablet 3  . pantoprazole (PROTONIX) 40 MG tablet TAKE 1 TABLET ONCE DAILY. 90 tablet 1  . rosuvastatin (CRESTOR) 10 MG tablet TAKE 1 TABLET ON TUESDAY, THURSDAY, SATURDAY AND SUNDAY. 51 tablet 5  . TOUJEO MAX SOLOSTAR 300 UNIT/ML SOPN Inject 70 Units into the  skin 2 (two) times daily.   1  . VASCEPA 1 G CAPS Take 2 g by mouth 2 (two) times daily.   4   No current facility-administered medications for this visit.     Allergies  Allergen Reactions  . Atorvastatin Other (See Comments)    Leg pain  . Ibuprofen Hives  . Pravastatin Other (See Comments)    Leg pain  . Simvastatin Other (See Comments)    Leg pain    Past Medical History:  Diagnosis Date  . Anemia   . Aortic sclerosis  - without Stenosis  March 2014   Echo: Aortic sclerosis without stenosis. EF 55-60% with normal WM; mild concentric hypertrophy with normal relaxation.  . Arthritis   . CAD (coronary artery disease), autologous vein bypass graft 09/2014   10/03/14: Cath for Class III-IV  Angina -- 80% ostial-proximal SVG-RPDA (minimal flow down native PDA from native RCA with 50% proximal and distal, patent LIMA-LAD, SVG-OM with retrograde filling of OM 2. --> 10/12/14: Staged PCI of SVG-RPDA - Xience DES  3.0 mm x 38 mm; (3.4  - 3.3 mm)  . CAD S/P percutaneous coronary angioplasty 06/12/2011; 09/2104   a) 2012: Complex PCI mRCA ([facility]) --  2 overlapping Promus element DES stents.; 09/2014:  ost-prox SVG-rPDA - Xience DES 3.0 mm x 38 mm; (3.4  - 3.3 mm)  . CHF (congestive heart failure) ([place])   . Childhood asthma   . CKD (chronic kidney disease), stage III ([place])    "mild; related to diabetes" (10/02/2014)  . Complication of anesthesia    "I've had name recall recognition problems since my last anesthesia"  . DM (diabetes mellitus) type II uncontrolled with eye manifestation ([place]) 2013   Diabetic retinopathy with retinal hemorrhages since;, recurrent in August 2014 treated with Avastin shots  . Dyspnea    mainly with exertion  . GERD (gastroesophageal reflux disease)   . Heart murmur    questionable  . High cholesterol   . Hypertension   . Migraine ~ 2004   "once"  . PAD (peripheral artery disease) (HCC)    with claudication  . PONV (postoperative nausea and vomiting)    s/p tonsillectomy  . 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 07/01/2013 (Cath for Crescendo Unstable Angina) --> [name]: For LM disease; LIMA-LAD, SVG-RCA, SCG-Cx; b. 09/2014 Cath/Staged PCI: patent LIMA->LAD, VG->OM1/RI, VG->RCA 80% (3.0x38 Xience Alpine DES on 10/12/2014).  . 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  Likely thromboembolic event with PDA occlusion. 06/12/2011   Post Complex PCI, pt states no mi  . Wears glasses    reading    There were no vitals taken for this visit.   Hyperlipidemia with target LDL less than 70; intolerance to atorvastatin and simvastatin Patient currently on all possible cholesterol medications.  States compliance with  fenofibrate and vascepa, taking fenofibrate with breakfast.  He also takes rosuvastatin 10 mg daily, but has problems remembering to use his Repatha injections.  We had a long conversation about ways to remember to take medication, and he was offered the once monthly infusions, although he admitted he probably wouldn't remember that either.  Finally I suggest that he program his iPhone for a reminder every 14 days.  He was going to try that.  We will repeat labs in 2 months.     Tommy Medal PharmD CPP [facility]

## 2018-07-22 ENCOUNTER — Encounter: Payer: Self-pay | Admitting: Pharmacist Clinician (PhC)/ Clinical Pharmacy Specialist

## 2018-07-22 NOTE — Assessment & Plan Note (Signed)
Patient currently on all possible cholesterol medications.  States compliance with fenofibrate and vascepa, taking fenofibrate with breakfast.  He also takes rosuvastatin 10 mg daily, but has problems remembering to use his Repatha injections.  We had a long conversation about ways to remember to take medication, and he was offered the once monthly infusions, although he admitted he probably wouldn't remember that either.  Finally I suggest that he program his iPhone for a reminder every 14 days.  He was going to try that.  We will repeat labs in 2 months.

## 2018-08-11 DIAGNOSIS — S83231A Complex tear of medial meniscus, current injury, right knee, initial encounter: Secondary | ICD-10-CM | POA: Diagnosis not present

## 2018-08-11 DIAGNOSIS — X58XXXA Exposure to other specified factors, initial encounter: Secondary | ICD-10-CM | POA: Diagnosis not present

## 2018-08-11 DIAGNOSIS — S83281A Other tear of lateral meniscus, current injury, right knee, initial encounter: Secondary | ICD-10-CM | POA: Diagnosis not present

## 2018-08-11 DIAGNOSIS — M6751 Plica syndrome, right knee: Secondary | ICD-10-CM | POA: Diagnosis not present

## 2018-08-11 DIAGNOSIS — M1711 Unilateral primary osteoarthritis, right knee: Secondary | ICD-10-CM | POA: Diagnosis not present

## 2018-08-11 DIAGNOSIS — M948X6 Other specified disorders of cartilage, lower leg: Secondary | ICD-10-CM | POA: Diagnosis not present

## 2018-08-11 DIAGNOSIS — S83271A Complex tear of lateral meniscus, current injury, right knee, initial encounter: Secondary | ICD-10-CM | POA: Diagnosis not present

## 2018-08-11 DIAGNOSIS — Y999 Unspecified external cause status: Secondary | ICD-10-CM | POA: Diagnosis not present

## 2018-08-11 DIAGNOSIS — S83241A Other tear of medial meniscus, current injury, right knee, initial encounter: Secondary | ICD-10-CM | POA: Diagnosis not present

## 2018-08-16 DIAGNOSIS — R262 Difficulty in walking, not elsewhere classified: Secondary | ICD-10-CM | POA: Diagnosis not present

## 2018-08-16 DIAGNOSIS — S83231D Complex tear of medial meniscus, current injury, right knee, subsequent encounter: Secondary | ICD-10-CM | POA: Diagnosis not present

## 2018-08-16 DIAGNOSIS — S83271D Complex tear of lateral meniscus, current injury, right knee, subsequent encounter: Secondary | ICD-10-CM | POA: Diagnosis not present

## 2018-08-16 DIAGNOSIS — M1711 Unilateral primary osteoarthritis, right knee: Secondary | ICD-10-CM | POA: Diagnosis not present

## 2018-08-19 DIAGNOSIS — M1711 Unilateral primary osteoarthritis, right knee: Secondary | ICD-10-CM | POA: Diagnosis not present

## 2018-08-20 DIAGNOSIS — S83231D Complex tear of medial meniscus, current injury, right knee, subsequent encounter: Secondary | ICD-10-CM | POA: Diagnosis not present

## 2018-08-20 DIAGNOSIS — R262 Difficulty in walking, not elsewhere classified: Secondary | ICD-10-CM | POA: Diagnosis not present

## 2018-08-20 DIAGNOSIS — S83271D Complex tear of lateral meniscus, current injury, right knee, subsequent encounter: Secondary | ICD-10-CM | POA: Diagnosis not present

## 2018-08-20 DIAGNOSIS — M1711 Unilateral primary osteoarthritis, right knee: Secondary | ICD-10-CM | POA: Diagnosis not present

## 2018-08-23 ENCOUNTER — Other Ambulatory Visit: Payer: Self-pay

## 2018-08-23 DIAGNOSIS — I6523 Occlusion and stenosis of bilateral carotid arteries: Secondary | ICD-10-CM

## 2018-08-23 DIAGNOSIS — I739 Peripheral vascular disease, unspecified: Secondary | ICD-10-CM

## 2018-08-23 DIAGNOSIS — I6522 Occlusion and stenosis of left carotid artery: Secondary | ICD-10-CM

## 2018-08-25 DIAGNOSIS — M1711 Unilateral primary osteoarthritis, right knee: Secondary | ICD-10-CM | POA: Diagnosis not present

## 2018-08-25 DIAGNOSIS — S83231D Complex tear of medial meniscus, current injury, right knee, subsequent encounter: Secondary | ICD-10-CM | POA: Diagnosis not present

## 2018-08-25 DIAGNOSIS — R262 Difficulty in walking, not elsewhere classified: Secondary | ICD-10-CM | POA: Diagnosis not present

## 2018-08-25 DIAGNOSIS — S83271D Complex tear of lateral meniscus, current injury, right knee, subsequent encounter: Secondary | ICD-10-CM | POA: Diagnosis not present

## 2018-08-26 ENCOUNTER — Ambulatory Visit (HOSPITAL_COMMUNITY)
Admission: RE | Admit: 2018-08-26 | Discharge: 2018-08-26 | Disposition: A | Discharge status: Home / Self Care | Payer: Medicare Other | Source: Ambulatory Visit | Attending: Vascular Surgery | Admitting: Vascular Surgery

## 2018-08-26 ENCOUNTER — Ambulatory Visit (INDEPENDENT_AMBULATORY_CARE_PROVIDER_SITE_OTHER)
Admission: RE | Admit: 2018-08-26 | Discharge: 2018-08-26 | Disposition: A | Discharge status: Home / Self Care | Payer: Medicare Other | Source: Ambulatory Visit | Attending: Vascular Surgery | Admitting: Vascular Surgery

## 2018-08-26 DIAGNOSIS — I6523 Occlusion and stenosis of bilateral carotid arteries: Secondary | ICD-10-CM | POA: Insufficient documentation

## 2018-08-26 DIAGNOSIS — I739 Peripheral vascular disease, unspecified: Secondary | ICD-10-CM | POA: Insufficient documentation

## 2018-08-26 DIAGNOSIS — I6522 Occlusion and stenosis of left carotid artery: Secondary | ICD-10-CM | POA: Diagnosis not present

## 2018-08-27 DIAGNOSIS — S83271D Complex tear of lateral meniscus, current injury, right knee, subsequent encounter: Secondary | ICD-10-CM | POA: Diagnosis not present

## 2018-08-27 DIAGNOSIS — R262 Difficulty in walking, not elsewhere classified: Secondary | ICD-10-CM | POA: Diagnosis not present

## 2018-08-27 DIAGNOSIS — S83231D Complex tear of medial meniscus, current injury, right knee, subsequent encounter: Secondary | ICD-10-CM | POA: Diagnosis not present

## 2018-08-27 DIAGNOSIS — M1711 Unilateral primary osteoarthritis, right knee: Secondary | ICD-10-CM | POA: Diagnosis not present

## 2018-08-31 DIAGNOSIS — R262 Difficulty in walking, not elsewhere classified: Secondary | ICD-10-CM | POA: Diagnosis not present

## 2018-08-31 DIAGNOSIS — S83231D Complex tear of medial meniscus, current injury, right knee, subsequent encounter: Secondary | ICD-10-CM | POA: Diagnosis not present

## 2018-08-31 DIAGNOSIS — S83271D Complex tear of lateral meniscus, current injury, right knee, subsequent encounter: Secondary | ICD-10-CM | POA: Diagnosis not present

## 2018-08-31 DIAGNOSIS — M1711 Unilateral primary osteoarthritis, right knee: Secondary | ICD-10-CM | POA: Diagnosis not present

## 2018-09-02 ENCOUNTER — Ambulatory Visit (HOSPITAL_COMMUNITY)
Admission: RE | Admit: 2018-09-02 | Discharge: 2018-09-02 | Disposition: A | Discharge status: Home / Self Care | Payer: Medicare Other | Source: Ambulatory Visit | Attending: Vascular Surgery | Admitting: Vascular Surgery

## 2018-09-02 ENCOUNTER — Ambulatory Visit (INDEPENDENT_AMBULATORY_CARE_PROVIDER_SITE_OTHER): Payer: Medicare Other | Admitting: Vascular Surgery

## 2018-09-02 ENCOUNTER — Encounter: Payer: Self-pay | Admitting: Vascular Surgery

## 2018-09-02 ENCOUNTER — Other Ambulatory Visit: Payer: Self-pay

## 2018-09-02 VITALS — BP 132/63 | HR 55 | Temp 97.7°F | Resp 20 | Ht 68.0 in | Wt 211.0 lb

## 2018-09-02 DIAGNOSIS — I739 Peripheral vascular disease, unspecified: Secondary | ICD-10-CM

## 2018-09-02 DIAGNOSIS — I6523 Occlusion and stenosis of bilateral carotid arteries: Secondary | ICD-10-CM

## 2018-09-02 NOTE — Progress Notes (Signed)
Patient is a 73 year old male who returns for follow-up today.  He recently underwent bilateral common iliac stenting as well as a left femoral endarterectomy September 2019.  He feels like his claudication symptoms have improved significantly.  He recently underwent a knee arthroscopy and states that he will be able to start walking more soon because of this.  He has previously undergone carotid endarterectomy on the right side 2015 and left 2017.  He denies any symptoms of TIA amaurosis or stroke.  Review of systems: He does have shortness of breath with activity.  He has chronic leg swelling.  He occasionally has a productive cough and wheezing.  Current Outpatient Medications on File Prior to Visit  Medication Sig Dispense Refill  . acetaminophen (TYLENOL) 325 MG tablet Take 650 mg by mouth daily as needed for mild pain or headache.     Marland Kitchen amLODipine (NORVASC) 5 MG tablet Take 5 mg by mouth daily.  5  . aspirin EC 81 MG tablet Take 1 tablet (81 mg total) by mouth every other day. (Patient taking differently: Take 81 mg by mouth See admin instructions. Take 81mg  once daily on Tues, Thurs, Sat, and Sun)    . carvedilol (COREG) 12.5 MG tablet TAKE 1 TABLET BY MOUTH TWICE DAILY. (Patient taking differently: Take 12.5 mg by mouth 2 (two) times daily. ) 180 tablet 3  . clopidogrel (PLAVIX) 75 MG tablet TAKE 1 TABLET DAILY WITH BREAKFAST. 90 tablet 3  . docusate sodium (STOOL SOFTENER) 100 MG capsule Take 100 mg by mouth daily as needed for mild constipation.     . Evolocumab (REPATHA SURECLICK) 782 MG/ML SOAJ Inject 140 mg into the skin every 14 (fourteen) days. 2 pen 12  . fenofibrate 160 MG tablet TAKE 1 TABLET ONCE DAILY. 90 tablet 1  . furosemide (LASIX) 40 MG tablet Take 120 mg by mouth 2 (two) times daily.    Marland Kitchen HUMALOG KWIKPEN 200 UNIT/ML SOPN Inject 20-28 Units into the skin See admin instructions. Use 20 units (base) per meal increase units given by 1 unit for every 15 increment over 150 (I.E.  Blood glucose reading 165=21 units, 180=22 units, etc. )  5  . hydrALAZINE (APRESOLINE) 100 MG tablet TAKE 1 TABLET BY MOUTH 3 TIMES A DAY. 90 tablet 1  . isosorbide mononitrate (IMDUR) 60 MG 24 hr tablet TAKE 1 TABLET ONCE DAILY. (Patient taking differently: Take 60 mg by mouth daily. ) 90 tablet 3  . pantoprazole (PROTONIX) 40 MG tablet TAKE 1 TABLET ONCE DAILY. 90 tablet 1  . rosuvastatin (CRESTOR) 10 MG tablet TAKE 1 TABLET ON TUESDAY, THURSDAY, SATURDAY AND SUNDAY. 51 tablet 5  . TOUJEO MAX SOLOSTAR 300 UNIT/ML SOPN Inject 70 Units into the skin 2 (two) times daily.   1  . VASCEPA 1 G CAPS Take 2 g by mouth 2 (two) times daily.   4   No current facility-administered medications on file prior to visit.     Past Surgical History:  Procedure Laterality Date  . ABDOMINAL AORTOGRAM W/LOWER EXTREMITY N/A 02/05/2018   Procedure: ABDOMINAL AORTOGRAM W/LOWER EXTREMITY;  Surgeon: Elam Dutch, MD;  Location: [place] [facility];  Service: Cardiovascular;  Laterality: N/A;  . CARDIAC CATHETERIZATION  06/28/2013   ostial LM disese, RCA ~40-50%ISR  . COLONOSCOPY    . CORONARY ANGIOPLASTY WITH STENT PLACEMENT Right September 2012   2 overlapping Promus DES 2.7 mm at 12 mm x2; postdilated to 3 mm  . CORONARY ARTERY BYPASS GRAFT N/A 07/01/2013  Procedure: CORONARY ARTERY BYPASS GRAFTING (CABG);  Surgeon: Grace Isaac, MD;  Location: [place];  Service: Open Heart Surgery;  Laterality: N/A;  Times 3 using left internal mammary artery and endoscopically harvested bilateral saphenous vein  . ENDARTERECTOMY Left 05/21/2016   Procedure: LEFT CAROTID ENDARTERECTOMY;  Surgeon: Elam Dutch, MD;  Location: [place];  Service: Vascular;  Laterality: Left;  . ENDARTERECTOMY FEMORAL Left 02/23/2018   Procedure: LEFT COMMON FEMORAL ENDARTERECTOMY;  Surgeon: Elam Dutch, MD;  Location: [place];  Service: Vascular;  Laterality: Left;  . EYE SURGERY Bilateral    ioc for cataracts  . INSERTION OF ILIAC STENT  Bilateral 02/23/2018   Procedure: INSERTION OF BILATERAL COMMON ILIAC STENTS WITH VIABAHN STENTS;  Surgeon: Elam Dutch, MD;  Location: [place];  Service: Vascular;  Laterality: Bilateral;  . INTRAOPERATIVE TRANSESOPHAGEAL ECHOCARDIOGRAM N/A 07/01/2013   Procedure: INTRAOPERATIVE TRANSESOPHAGEAL ECHOCARDIOGRAM;  Surgeon: Grace Isaac, MD;  Location: [place];  Service: Open Heart Surgery;  Laterality: N/A;  . LEFT AND RIGHT HEART CATHETERIZATION WITH CORONARY/GRAFT ANGIOGRAM N/A 10/03/2014   Procedure: LEFT AND RIGHT HEART CATHETERIZATION WITH Beatrix Fetters;  Surgeon: Leonie Man, MD;  Location[facility];  Service: Cardiovascular;  Laterality: N/A;  . LEFT HEART CATHETERIZATION WITH CORONARY ANGIOGRAM N/A 06/11/2011   Procedure: LEFT HEART CATHETERIZATION WITH CORONARY ANGIOGRAM;  Surgeon: Fulton Reek, MD;  Location: [facility] CATH LAB;  Service: Cardiovascular;  Laterality: N/A;  . LEFT HEART CATHETERIZATION WITH CORONARY ANGIOGRAM N/A 06/28/2013   Procedure: LEFT HEART CATHETERIZATION WITH CORONARY ANGIOGRAM;  Surgeon: Leonie Man, MD;  Location: [facility] CATH LAB;  Service: Cardiovascular;  Laterality: N/A;  . NM MYOVIEW LTD  04/29/2018   LEXISCAN: Non-gated study with small fixed moderate intensity defect in the apical inferior wall suspicious for either small area of infarction versus attenuation (no change from previous study).  LOW RISK.   Marland Kitchen PATCH ANGIOPLASTY Left 05/21/2016   Procedure: PATCH ANGIOPLASTY USING HEMASHIELD PLATINUM FINESSE 0.3in x 3 in;  Surgeon: Elam Dutch, MD;  Location: [place];  Service: Vascular;  Laterality: Left;  . PATCH ANGIOPLASTY Left 02/23/2018   Procedure: PATCH ANGIOPLASTY LEFT COMMON FEMORAL ARTERY;  Surgeon: Elam Dutch, MD;  Location: [place];  Service: Vascular;  Laterality: Left;  . PERCUTANEOUS CORONARY STENT INTERVENTION (PCI-S)  06/11/2011   Procedure: PERCUTANEOUS CORONARY STENT INTERVENTION (PCI-S);  Surgeon: Fulton Reek, MD;   Location: [facility] C[facility] LAB;  Service: Cardiovascular;;  . PERCUTANEOUS CORONARY STENT INTERVENTION (PCI-S) N/A 10/12/2014   Procedure: PERCUTANEOUS CORONARY STENT INTERVENTION (PCI-S);  Surgeon: Leonie Man, MD;  Location: [facility] CATH LAB;  Ostial-Prox SVG-RCA Xience Alpine DES 3.0 mm x 38 mm; (3.4  - 3.3 mm)  . RETINAL LASER PROCEDURE Bilateral    "more than once"   . TONSILLECTOMY  as child  . TRANSTHORACIC ECHOCARDIOGRAM  03/2017    EF 55-60%.  Normal wall thickness.  No regional wall motion normalities.  GR 2 DD.  Mild-moderate calcified aortic valve.  Mild RV systolic dysfunction.  Moderate RV dilation.  Moderately increased PA pressures.    Past Medical History:  Diagnosis Date  . Anemia   . Aortic sclerosis  - without Stenosis  March 2014   Echo: Aortic sclerosis without stenosis. EF 55-60% with normal WM; mild concentric hypertrophy with normal relaxation.  . Arthritis   . CAD (coronary artery disease), autologous vein bypass graft 09/2014   10/03/14: Cath for Class III-IV Angina -- 80% ostial-proximal SVG-RPDA (  minimal flow down native PDA from native RCA with 50% proximal and distal, patent LIMA-LAD, SVG-OM with retrograde filling of OM 2. --> 10/12/14: Staged PCI of SVG-RPDA - Xience DES  3.0 mm x 38 mm; (3.4  - 3.3 mm)  . CAD S/P percutaneous coronary angioplasty 06/12/2011; 09/2104   a) 2012: Complex PCI mRCA ([facility]) --  2 overlapping Promus element DES stents.; 09/2014:  ost-prox SVG-rPDA - Xience DES 3.0 mm x 38 mm; (3.4  - 3.3 mm)  . CHF (congestive heart failure) ([place])   . Childhood asthma   . CKD (chronic kidney disease), stage III ([place])    "mild; related to diabetes" (10/02/2014)  . Complication of anesthesia    "I've had name recall recognition problems since my last anesthesia"  . DM (diabetes mellitus) type II uncontrolled with eye manifestation ([place]) 2013   Diabetic retinopathy with retinal hemorrhages since;, recurrent in August 2014 treated with Avastin shots  . Dyspnea     mainly with exertion  . GERD (gastroesophageal reflux disease)   . Heart murmur    questionable  . High cholesterol   . Hypertension   . Migraine ~ 2004   "once"  . PAD (peripheral artery disease) (HCC)    with claudication  . PONV (postoperative nausea and vomiting)    s/p tonsillectomy  . 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 07/01/2013 (Cath for Crescendo Unstable Angina) --> [name]: For LM disease; LIMA-LAD, SVG-RCA, SCG-Cx; b. 09/2014 Cath/Staged PCI: patent LIMA->LAD, VG->OM1/RI, VG->RCA 80% (3.0x38 Xience Alpine DES on 10/12/2014).  . 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  Likely thromboembolic event with PDA occlusion. 06/12/2011   Post Complex PCI, pt states no mi  . Wears glasses    reading    Physical exam:  Vitals:   09/02/18 1105 09/02/18 1108  BP: (!) 114/56 132/63  Pulse: (!) 161 (!) 55  Resp: 20   Temp: 97.7 F (36.5 C)   TempSrc: Oral   SpO2: 96%   Weight: 211 lb (95.7 kg)   Height: 5\' 8"  (1.727 m)     Extremities: No palpable pedal pulses 1+ edema pretibial extending into the foot bilaterally  Skin: No open ulceration  Data: Patient had a duplex ultrasound of the lower extremities today.  This showed 50% right common femoral stenosis, 50% distal left common femoral stenosis with bilateral SFA occlusions.  ABIs were 0.4 on the left 0.8 on the right.  This is slightly improved on the left and dramatically improved on the right.  I reviewed and interpreted the studies.  Assessment: 1.  Peripheral arterial disease improvement in claudication symptoms still with marginal perfusion in the left lower extremity.  Discussed with the patient that we could revisit a possible percutaneous procedure on the left leg now that he has had a femoral endarterectomy and iliac stenting if his symptoms become worse over time or if he develops a nonhealing wound in his left foot.  He is pretty satisfied overall with his walking  distance and leg situation currently.  He will follow-up in 3 months time for duplex scan and repeat ABIs.  2.  Carotid occlusive disease no significant carotid stenosis on his most recent carotid duplex exam.  We can probably repeat this in a few years.  Ruta Hinds, MD [facility] [place] Office: [phone] Pager: [phone]

## 2018-09-03 DIAGNOSIS — M1711 Unilateral primary osteoarthritis, right knee: Secondary | ICD-10-CM | POA: Diagnosis not present

## 2018-09-03 DIAGNOSIS — R262 Difficulty in walking, not elsewhere classified: Secondary | ICD-10-CM | POA: Diagnosis not present

## 2018-09-03 DIAGNOSIS — S83231D Complex tear of medial meniscus, current injury, right knee, subsequent encounter: Secondary | ICD-10-CM | POA: Diagnosis not present

## 2018-09-03 DIAGNOSIS — S83271D Complex tear of lateral meniscus, current injury, right knee, subsequent encounter: Secondary | ICD-10-CM | POA: Diagnosis not present

## 2018-09-07 DIAGNOSIS — R262 Difficulty in walking, not elsewhere classified: Secondary | ICD-10-CM | POA: Diagnosis not present

## 2018-09-07 DIAGNOSIS — M25561 Pain in right knee: Secondary | ICD-10-CM | POA: Diagnosis not present

## 2018-09-07 DIAGNOSIS — M1711 Unilateral primary osteoarthritis, right knee: Secondary | ICD-10-CM | POA: Diagnosis not present

## 2018-09-07 DIAGNOSIS — S83231D Complex tear of medial meniscus, current injury, right knee, subsequent encounter: Secondary | ICD-10-CM | POA: Diagnosis not present

## 2018-09-20 ENCOUNTER — Other Ambulatory Visit: Payer: Self-pay | Admitting: Cardiology

## 2018-09-26 ENCOUNTER — Other Ambulatory Visit: Payer: Self-pay | Admitting: Cardiology

## 2018-11-03 ENCOUNTER — Telehealth: Payer: Self-pay

## 2018-11-03 NOTE — Telephone Encounter (Signed)
This one is mine.  Working on it

## 2018-11-10 DIAGNOSIS — N184 Chronic kidney disease, stage 4 (severe): Secondary | ICD-10-CM | POA: Diagnosis not present

## 2018-11-10 DIAGNOSIS — Z794 Long term (current) use of insulin: Secondary | ICD-10-CM | POA: Diagnosis not present

## 2018-11-10 DIAGNOSIS — E1139 Type 2 diabetes mellitus with other diabetic ophthalmic complication: Secondary | ICD-10-CM | POA: Diagnosis not present

## 2018-11-10 DIAGNOSIS — I10 Essential (primary) hypertension: Secondary | ICD-10-CM | POA: Diagnosis not present

## 2018-11-24 ENCOUNTER — Telehealth: Payer: Self-pay | Admitting: Pharmacist

## 2018-11-24 DIAGNOSIS — E785 Hyperlipidemia, unspecified: Secondary | ICD-10-CM

## 2018-11-24 NOTE — Telephone Encounter (Signed)
Patient is taking Cresto daily but stopped repatha (was not working??)  Will re-try Repatha this week, continue Crestor and repeat blood work next week.

## 2018-12-02 DIAGNOSIS — E785 Hyperlipidemia, unspecified: Secondary | ICD-10-CM | POA: Diagnosis not present

## 2018-12-02 LAB — HEPATIC FUNCTION PANEL
ALT: 31 IU/L (ref 0–44)
AST: 31 IU/L (ref 0–40)
Albumin: 4.1 g/dL (ref 3.7–4.7)
Alkaline Phosphatase: 70 IU/L (ref 39–117)
Bilirubin Total: 0.3 mg/dL (ref 0.0–1.2)
Bilirubin, Direct: 0.1 mg/dL (ref 0.00–0.40)
Total Protein: 6.5 g/dL (ref 6.0–8.5)

## 2018-12-02 LAB — LIPID PANEL
Chol/HDL Ratio: 6.5 ratio — ABNORMAL HIGH (ref 0.0–5.0)
Cholesterol, Total: 150 mg/dL (ref 100–199)
HDL: 23 mg/dL — ABNORMAL LOW (ref >39)
LDL Calculated: 59 mg/dL (ref 0–99)
Triglycerides: 341 mg/dL — ABNORMAL HIGH (ref 0–149)
VLDL Cholesterol Cal: 68 mg/dL — ABNORMAL HIGH (ref 5–40)

## 2018-12-06 ENCOUNTER — Other Ambulatory Visit: Payer: Self-pay

## 2018-12-06 DIAGNOSIS — I739 Peripheral vascular disease, unspecified: Secondary | ICD-10-CM

## 2018-12-08 ENCOUNTER — Telehealth (HOSPITAL_COMMUNITY): Payer: Self-pay

## 2018-12-08 NOTE — Telephone Encounter (Signed)
The above patient or their representative was contacted and gave the following answers to these questions:         Do you have any of the following symptoms? No  Fever                    Cough                   Shortness of breath  Do  you have any of the following other symptoms? No   muscle pain         vomiting,        diarrhea        rash         weakness        red eye        abdominal pain         bruising          bruising or bleeding              joint pain           severe headache    Have you been in contact with someone who was or has been sick in the past 2 weeks? No  Yes                 Unsure                         Unable to assess   Does the person that you were in contact with have any of the following symptoms?   Cough         shortness of breath           muscle pain         vomiting,            diarrhea            rash            weakness           fever            red eye           abdominal pain           bruising  or  bleeding                joint pain                severe headache               Have you  or someone you have been in contact with traveled internationally in th last month? No        If yes, which countries?   Have you  or someone you have been in contact with traveled outside  in th last month? No         If yes, which state and city?   COMMENTS OR ACTION PLAN FOR THIS PATIENT:          

## 2018-12-09 ENCOUNTER — Encounter: Payer: Self-pay | Admitting: Vascular Surgery

## 2018-12-09 ENCOUNTER — Telehealth: Payer: Self-pay

## 2018-12-09 ENCOUNTER — Other Ambulatory Visit: Payer: Self-pay

## 2018-12-09 ENCOUNTER — Ambulatory Visit (HOSPITAL_COMMUNITY)
Admission: RE | Admit: 2018-12-09 | Discharge: 2018-12-09 | Disposition: A | Discharge status: Home / Self Care | Payer: Medicare Other | Source: Ambulatory Visit | Attending: Vascular Surgery | Admitting: Vascular Surgery

## 2018-12-09 ENCOUNTER — Ambulatory Visit (INDEPENDENT_AMBULATORY_CARE_PROVIDER_SITE_OTHER): Payer: Medicare Other | Admitting: Vascular Surgery

## 2018-12-09 VITALS — BP 134/58 | HR 58 | Temp 97.9°F | Resp 20 | Ht 68.0 in | Wt 211.0 lb

## 2018-12-09 DIAGNOSIS — I6523 Occlusion and stenosis of bilateral carotid arteries: Secondary | ICD-10-CM

## 2018-12-09 DIAGNOSIS — I739 Peripheral vascular disease, unspecified: Secondary | ICD-10-CM

## 2018-12-09 MED ORDER — REPATHA SURECLICK 140 MG/ML ~~LOC~~ SOAJ: 140.0000 mg | SUBCUTANEOUS | 12 refills | Status: DC

## 2018-12-09 NOTE — Progress Notes (Signed)
Patient is a 73 year old male who returns today for follow-up.  He has previously had treatment for peripheral arterial disease with left femoral endarterectomy and bilateral common iliac stents.  He has also previously undergone bilateral carotid endarterectomies.  He continues to experience claudication symptoms in both lower extremities.  He is able to walk about a half a block before experiencing pain.  He has no open wounds on or ulcers on his feet.  He denies rest pain.  He does have also underlying renal dysfunction and coronary artery disease both of which have been stable.  He has now essentially retired and spends most of his days reading and walking around the house some.  He is on a statin Plavix and aspirin.  He denies any symptoms of TIA amaurosis or stroke.  Review of systems: He has some occasional intermittent lower extremity swelling.  He also has shortness of breath with exertion.  Current Outpatient Medications on File Prior to Visit  Medication Sig Dispense Refill  . acetaminophen (TYLENOL) 325 MG tablet Take 650 mg by mouth daily as needed for mild pain or headache.     Marland Kitchen amLODipine (NORVASC) 5 MG tablet Take 5 mg by mouth daily.  5  . aspirin EC 81 MG tablet Take 1 tablet (81 mg total) by mouth every other day. (Patient taking differently: Take 81 mg by mouth See admin instructions. Take 81mg  once daily on Tues, Thurs, Sat, and Sun)    . carvedilol (COREG) 12.5 MG tablet TAKE 1 TABLET BY MOUTH TWICE DAILY. (Patient taking differently: Take 12.5 mg by mouth 2 (two) times daily. ) 180 tablet 3  . clopidogrel (PLAVIX) 75 MG tablet TAKE 1 TABLET DAILY WITH BREAKFAST. 90 tablet 3  . Continuous Blood Gluc Sensor (FREESTYLE LIBRE 14 DAY SENSOR) MISC MONITOR BS CONTINUOUSLY  CHANGE Q 14 DAYS.    Marland Kitchen docusate sodium (STOOL SOFTENER) 100 MG capsule Take 100 mg by mouth daily as needed for mild constipation.     . Evolocumab (REPATHA SURECLICK) 893 MG/ML SOAJ Inject 140 mg into the skin  every 14 (fourteen) days. 2 pen 12  . fenofibrate 160 MG tablet TAKE 1 TABLET ONCE DAILY. 90 tablet 2  . furosemide (LASIX) 80 MG tablet     . HUMALOG KWIKPEN 200 UNIT/ML SOPN Inject 20-28 Units into the skin See admin instructions. Use 20 units (base) per meal increase units given by 1 unit for every 15 increment over 150 (I.E. Blood glucose reading 165=21 units, 180=22 units, etc. )  5  . hydrALAZINE (APRESOLINE) 100 MG tablet TAKE 1 TABLET BY MOUTH 3 TIMES A DAY. 270 tablet 2  . isosorbide mononitrate (IMDUR) 60 MG 24 hr tablet TAKE 1 TABLET ONCE DAILY. (Patient taking differently: Take 60 mg by mouth daily. ) 90 tablet 3  . pantoprazole (PROTONIX) 40 MG tablet TAKE 1 TABLET ONCE DAILY. 90 tablet 0  . rosuvastatin (CRESTOR) 10 MG tablet TAKE 1 TABLET ON TUESDAY, THURSDAY, SATURDAY AND SUNDAY. 51 tablet 5  . TOUJEO MAX SOLOSTAR 300 UNIT/ML SOPN Inject 68 Units into the skin 2 (two) times daily.   1  . VASCEPA 1 G CAPS Take 2 g by mouth 2 (two) times daily.   4   No current facility-administered medications on file prior to visit.     Physical exam:  Vitals:   12/09/18 0933  BP: (!) 134/58  Pulse: (!) 58  Resp: 20  Temp: 97.9 F (36.6 C)  SpO2: 96%  Weight: 211 lb (  95.7 kg)  Height: 5\' 8"  (1.727 m)    Extremities: Feet were inspected no ulcers or cracks or other traumatic injuries no palpable pedal pulses  Data: Patient had bilateral ABIs performed today which were 0.6 on the right 0.4 on the left fairly consistent with his prior ABIs a few months ago.  His last carotid duplex was March 2020.  Assessment: Stable peripheral arterial disease bilateral lower extremities.  I discussed the patient today whether or not he wished any further intervention on his lower extremities but stated he would not undergo any interventions currently as he does not want to come back in the hospital.  Is also concerned about renal dysfunction from contrast.  Bilateral carotid occlusive disease no  significant stenosis post carotid endarterectomy.  No neurologic symptoms  Plan: Patient will follow-up with me in 6 months time with repeat ABIs.  He will call sooner if he has worsening symptoms or new development of ulcerations on his feet.  Ruta Hinds, MD [facility] Office: [phone] Pager: [phone]

## 2018-12-09 NOTE — Addendum Note (Signed)
Addended by: Allean Found on: 12/09/2018 02:57 PM   Modules accepted: Orders

## 2018-12-09 NOTE — Telephone Encounter (Signed)
lmomed pt regarding repatha stated pa is good till 12/08/19 and to call us back if they need a refill

## 2018-12-16 DIAGNOSIS — M25561 Pain in right knee: Secondary | ICD-10-CM | POA: Diagnosis not present

## 2018-12-16 DIAGNOSIS — M1711 Unilateral primary osteoarthritis, right knee: Secondary | ICD-10-CM | POA: Diagnosis not present

## 2018-12-24 ENCOUNTER — Other Ambulatory Visit: Payer: Self-pay | Admitting: Cardiology

## 2018-12-27 NOTE — Telephone Encounter (Signed)
Rx(s) sent to pharmacy electronically.  

## 2019-01-03 ENCOUNTER — Encounter: Payer: Self-pay | Admitting: General Surgery

## 2019-01-03 NOTE — Progress Notes (Signed)
CC: recall for colonoscopy

## 2019-01-04 ENCOUNTER — Encounter: Payer: Self-pay | Admitting: Internal Medicine

## 2019-01-04 ENCOUNTER — Ambulatory Visit (INDEPENDENT_AMBULATORY_CARE_PROVIDER_SITE_OTHER): Payer: Medicare Other | Admitting: Internal Medicine

## 2019-01-04 VITALS — Ht 68.5 in | Wt 210.0 lb

## 2019-01-04 DIAGNOSIS — Z7902 Long term (current) use of antithrombotics/antiplatelets: Secondary | ICD-10-CM

## 2019-01-04 DIAGNOSIS — E119 Type 2 diabetes mellitus without complications: Secondary | ICD-10-CM | POA: Diagnosis not present

## 2019-01-04 DIAGNOSIS — Z794 Long term (current) use of insulin: Secondary | ICD-10-CM

## 2019-01-04 DIAGNOSIS — Z860100 Personal history of colon polyps, unspecified: Secondary | ICD-10-CM

## 2019-01-04 DIAGNOSIS — I6523 Occlusion and stenosis of bilateral carotid arteries: Secondary | ICD-10-CM | POA: Diagnosis not present

## 2019-01-04 DIAGNOSIS — IMO0001 Reserved for inherently not codable concepts without codable children: Secondary | ICD-10-CM

## 2019-01-04 NOTE — Progress Notes (Addendum)
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  Colonoscopy 2014 elsewhere with tubulovillous and tubular adenomas.  I saw the patient January 06, 2017 for surveillance colonoscopy.  Required extensive prep.  Was taken off Plavix for 5 days with cardiology approval.  He was to have follow-up colonoscopy in 1 year but had issues with symptomatic claudication being addressed at that time.  He eventually underwentAbdominal aortogram bilateral retrograde common femoral puncture, bilateral common iliac stent (8 x 39 VBX), left external iliac common femoral superficial femoral endarterectomy with Dr. Oneida Alar September 2019.  Hospitalized about a week later with symptomatic anemia felt secondary to procedure related blood loss.  He has had no problems since.  No interval cardiac issues.  Still has some issues with claudication.  In addition to Plavix he takes daily aspirin 4 times per week.  For his GERD he is on pantoprazole daily.  No active symptoms on medication.  Except for occasional constipation which is treated with stool softener, his GI review of systems is negative.  He tells me that his diabetes is under good control and under the supervision of Dr. Forde Dandy.  Review of outside blood work from December 02, 2018 reveals normal liver test.  Laboratories from January 2020 revealed creatinine 2.32.  Last hemoglobin October 2019 was 11.2.  REVIEW OF SYSTEMS:  All non-GI ROS negative unless otherwise stated in the HPI.  Past Medical History:  Diagnosis Date  . Anemia   . Aortic sclerosis  - without Stenosis  March 2014   Echo: Aortic sclerosis without stenosis. EF 55-60% with normal WM; mild concentric hypertrophy with normal relaxation.  .  Arthritis   . CAD (coronary artery disease), autologous vein bypass graft 09/2014   10/03/14: Cath for Class III-IV Angina -- 80% ostial-proximal SVG-RPDA (minimal flow down native PDA from native RCA with 50% proximal and distal, patent LIMA-LAD, SVG-OM with retrograde filling of OM 2. --> 10/12/14: Staged PCI of SVG-RPDA - Xience DES  3.0 mm x 38 mm; (3.4  - 3.3 mm)  . CAD S/P percutaneous coronary angioplasty 06/12/2011; 09/2104   a) 2012: Complex PCI mRCA ([place]) --  2 overlapping Promus element DES stents.; 09/2014:  ost-prox SVG-rPDA - Xience DES 3.0 mm x 38 mm; (3.4  - 3.3 mm)  . CHF (congestive heart failure) ([place])   . Childhood asthma   . CKD (chronic kidney disease), stage III ([place])    "mild; related to diabetes" (10/02/2014)  . Complication of anesthesia    "I've had name recall recognition problems since my last anesthesia"  . DM (diabetes mellitus) type II uncontrolled with eye manifestation ([place]) 2013   Diabetic retinopathy with retinal hemorrhages since;, recurrent in August 2014 treated with Avastin shots  . Dyspnea    mainly with exertion  . GERD (gastroesophageal reflux disease)   . Heart murmur    questionable  . High cholesterol   . Hypertension   . Migraine ~ 2004   "once"  . PAD (peripheral artery disease) ([facility])    with claudication  . PONV (postoperative nausea and vomiting)    s/p tonsillectomy  . 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 07/01/2013 (Cath for [place]cendo Unstable Angina) --> [name]: For LM disease; LIMA-LAD, SVG-RCA, SCG-Cx; b. 09/2014 Cath/Staged PCI: patent LIMA->LAD,  VG->OM1/RI, VG->RCA 80% (3.0x38 Xience Alpine DES on 10/12/2014).  . 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  Likely thromboembolic event with PDA occlusion. 06/12/2011   Post Complex PCI, pt states no mi  . Wears glasses    reading    Past Surgical History:  Procedure Laterality Date  . ABDOMINAL AORTOGRAM W/LOWER EXTREMITY N/A 02/05/2018    Procedure: ABDOMINAL AORTOGRAM W/LOWER EXTREMITY;  Surgeon: Elam Dutch, MD;  Location: [facility];  Service: Cardiovascular;  Laterality: N/A;  . CARDIAC CATHETERIZATION  06/28/2013   ostial LM disese, RCA ~40-50%ISR  . COLONOSCOPY    . CORONARY ANGIOPLASTY WITH STENT PLACEMENT Right September 2012   2 overlapping Promus DES 2.7 mm at 12 mm x2; postdilated to 3 mm  . CORONARY ARTERY BYPASS GRAFT N/A 07/01/2013   Procedure: CORONARY ARTERY BYPASS GRAFTING (CABG);  Surgeon: Grace Isaac, MD;  Location: [place];  Service: Open Heart Surgery;  Laterality: N/A;  Times 3 using left internal mammary artery and endoscopically harvested bilateral saphenous vein  . ENDARTERECTOMY Left 05/21/2016   Procedure: LEFT CAROTID ENDARTERECTOMY;  Surgeon: Elam Dutch, MD;  Location: [place];  Service: Vascular;  Laterality: Left;  . ENDARTERECTOMY FEMORAL Left 02/23/2018   Procedure: LEFT COMMON FEMORAL ENDARTERECTOMY;  Surgeon: Elam Dutch, MD;  Location: [place];  Service: Vascular;  Laterality: Left;  . EYE SURGERY Bilateral    ioc for cataracts  . INSERTION OF ILIAC STENT Bilateral 02/23/2018   Procedure: INSERTION OF BILATERAL COMMON ILIAC STENTS WITH VIABAHN STENTS;  Surgeon: Elam Dutch, MD;  Location: [place];  Service: Vascular;  Laterality: Bilateral;  . INTRAOPERATIVE TRANSESOPHAGEAL ECHOCARDIOGRAM N/A 07/01/2013   Procedure: INTRAOPERATIVE TRANSESOPHAGEAL ECHOCARDIOGRAM;  Surgeon: Grace Isaac, MD;  Location: [place];  Service: Open Heart Surgery;  Laterality: N/A;  . LEFT AND RIGHT HEART CATHETERIZATION WITH CORONARY/GRAFT ANGIOGRAM N/A 10/03/2014   Procedure: LEFT AND RIGHT HEART CATHETERIZATION WITH Beatrix Fetters;  Surgeon: Leonie Man, MD;  Location: [facility] C[facility] LAB;  Service: Cardiovascular;  Laterality: N/A;  . LEFT HEART CATHETERIZATION WITH CORONARY ANGIOGRAM N/A 06/11/2011   Procedure: LEFT HEART CATHETERIZATION WITH CORONARY ANGIOGRAM;  Surgeon: Fulton Reek, MD;  Location: [facility] C[facility] LAB;  Service: Cardiovascular;  Laterality: N/A;  . LEFT HEART CATHETERIZATION WITH CORONARY ANGIOGRAM N/A 06/28/2013   Procedure: LEFT HEART CATHETERIZATION WITH CORONARY ANGIOGRAM;  Surgeon: Leonie Man, MD;  Location: [facility] CATH LAB;  Service: Cardiovascular;  Laterality: N/A;  . NM MYOVIEW LTD  04/29/2018   LEXISCAN: Non-gated study with small fixed moderate intensity defect in the apical inferior wall suspicious for either small area of infarction versus attenuation (no change from previous study).  LOW RISK.   Marland Kitchen PATCH ANGIOPLASTY Left 05/21/2016   Procedure: PATCH ANGIOPLASTY USING HEMASHIELD PLATINUM FINESSE 0.3in x 3 in;  Surgeon: Elam Dutch, MD;  Location: [place];  Service: Vascular;  Laterality: Left;  . PATCH ANGIOPLASTY Left 02/23/2018   Procedure: PATCH ANGIOPLASTY LEFT COMMON FEMORAL ARTERY;  Surgeon: Elam Dutch, MD;  Location: [place];  Service: Vascular;  Laterality: Left;  . PERCUTANEOUS CORONARY STENT INTERVENTION (PCI-S)  06/11/2011   Procedure: PERCUTANEOUS CORONARY STENT INTERVENTION (PCI-S);  Surgeon: Fulton Reek, MD;  Location: [facility] CATH LAB;  Service: Cardiovascular;;  . PERCUTANEOUS CORONARY STENT INTERVENTION (PCI-S) N/A 10/12/2014   Procedure: PERCUTANEOUS CORONARY STENT INTERVENTION (PCI-S);  Surgeon: Leonie Man, MD;  Location: [facility] CATH LAB;  Ostial-Prox SVG-RCA Xience Alpine DES 3.0 mm x 38 mm; (3.4  - 3.3 mm)  . RETINAL LASER PROCEDURE Bilateral    "more than once"   . TONSILLECTOMY  as child  . TRANSTHORACIC ECHOCARDIOGRAM  03/2017    EF 55-60%.  Normal wall thickness.  No regional wall motion normalities.  GR 2 DD.  Mild-moderate calcified aortic valve.  Mild RV systolic dysfunction.  Moderate RV dilation.  Moderately increased PA pressures.    Social History Lance Collier  reports that he quit smoking about 22 years ago. His smoking use included cigarettes. He has a 54.00 pack-year smoking history. He has never used  smokeless tobacco. He reports current alcohol use. He reports that he does not use drugs.  family history includes Breast cancer in his mother and sister; Colon polyps in his father; Hyperlipidemia in his father; Hypertension in his father; Liver cancer in his maternal grandmother; Lung cancer in his sister; Throat cancer in his father.  Allergies  Allergen Reactions  . Atorvastatin Other (See Comments)    Leg pain  . Ibuprofen Hives  . Pravastatin Other (See Comments)    Leg pain  . Simvastatin Other (See Comments)    Leg pain       PHYSICAL EXAMINATION: No physical exam with telehealth medicine visit   ASSESSMENT:  1.  History of multiple and advanced adenomatous colon polyps.  Last examination January 06, 2017 with 14 polyps.  Overdue for follow-up secondary to intervening issues with symptomatic peripheral vascular disease 2.  Medical problems including coronary artery disease for which he remains on Plavix 3.  Insulin requiring diabetes mellitus   PLAN:  1.  SCHEDULE colonoscopy in [place].  Patient is HIGH RISK due to his comorbidities and the need to address and modify above Plavix therapy and insulins.The nature of the procedure, as well as the risks, benefits, and alternatives were carefully and thoroughly reviewed with the patient. Ample time for discussion and questions allowed. The patient understood, was satisfied, and agreed to proceed. 2.  RECOMMEND half dose of MIRALAX PREP in the morning the day prior to procedure, to be followed that day by standard SPLIT PREP such as SUPREP. 3.  Hold insulin the morning of the procedure to avoid unwanted hypoglycemia 4.  Hold Plavix 5 days prior to the procedure.  The pros and cons of this strategy reviewed as previously 5.  Stay on aspirin daily throughout 6.  Continue PPI for GERD and upper GI mucosal protection This telehealth medicine visit was initiated by and consented for by the patient who was in his home while I was in my  office during the encounter.  He understands her may be associated professional charge for this comprehensive service totaling 25 minutes

## 2019-01-05 ENCOUNTER — Telehealth: Payer: Self-pay | Admitting: General Surgery

## 2019-01-05 DIAGNOSIS — Z86010 Personal history of colonic polyps: Secondary | ICD-10-CM

## 2019-01-05 MED ORDER — NA SULFATE-K SULFATE-MG SULF 17.5-3.13-1.6 GM/177ML PO SOLN: 1.0000 | Freq: Once | ORAL | 0 refills | Status: AC

## 2019-01-05 NOTE — Patient Instructions (Addendum)
If you are age 75 or older, your body mass index should be between 23-30. Your Body mass index is 31.47 kg/m. If this is out of the aforementioned range listed, please consider follow up with your Primary Care Provider.  If you are age 23 or younger, your body mass index should be between 19-25. Your Body mass index is 31.47 kg/m. If this is out of the aformentioned range listed, please consider follow up with your Primary Care Provider.   Colonoscopy scheduled for 01/21/2019 @ 1:30 we will use a split prep of Mirilax and Suprep.  We have sent the following medications to your pharmacy for you to pick up at your convenience:  Suprep  We have sent a request to your Cardiologist to hold your Plavix for 5 days. He will direct [place] and we will advise you of his determination.  Stay on aspirin daily throughout. Continue PPI for GERD and upper GI mucosal protection

## 2019-01-05 NOTE — Telephone Encounter (Signed)
error 

## 2019-01-05 NOTE — Telephone Encounter (Signed)
Dr. Ellyn Hack can pt be off plavix for 5 days for colonscopy? And should I recommend that vascular included on this question?  Last PAD stent 8/19.

## 2019-01-05 NOTE — Telephone Encounter (Signed)
[facility] Pre-operative Risk Assessment     Request for surgical clearance:     Endoscopy Procedure  What type of surgery is being performed?     Colonoscopy  When is this surgery scheduled?     01/21/2019  What type of clearance is required ?   Plavix clearance  Are there any medications that need to be held prior to surgery and how long? Plavix for 5 days  Practice name and name of physician performing surgery?      [facility]  What is your office phone and fax number?      Phone- [phone]  Fax- [phone]

## 2019-01-05 NOTE — Telephone Encounter (Signed)
Erroneous encounter

## 2019-01-08 NOTE — Telephone Encounter (Signed)
Yes

## 2019-01-10 NOTE — Telephone Encounter (Signed)
Dr. Oneida Alar  This is a patient of yours. You last saw him 11/2018 in which he was having claudication symptoms however he was not interested in undergoing further intervention at that time. Our office was contacted in reference to his Plavix therapy. He is to undergo a colonoscopy and requesting team is asking to hold Plavix for 5 days. From a cardiology perspective, Dr. Ellyn Hack states he can hold it. You performed a left femoral endarterectomy and bilateral common illiac stenting 02/2018. Are you comfortable with holding Plavix or should we deny the request?     Thank You  Kathyrn Drown, NP-C

## 2019-01-11 DIAGNOSIS — E1139 Type 2 diabetes mellitus with other diabetic ophthalmic complication: Secondary | ICD-10-CM | POA: Diagnosis not present

## 2019-01-11 DIAGNOSIS — H4312 Vitreous hemorrhage, left eye: Secondary | ICD-10-CM | POA: Diagnosis not present

## 2019-01-11 DIAGNOSIS — E113553 Type 2 diabetes mellitus with stable proliferative diabetic retinopathy, bilateral: Secondary | ICD-10-CM | POA: Diagnosis not present

## 2019-01-11 DIAGNOSIS — Z961 Presence of intraocular lens: Secondary | ICD-10-CM | POA: Diagnosis not present

## 2019-01-13 NOTE — Telephone Encounter (Signed)
Is there any way to find out if Dr. Oneida Alar has a covering physician monitoring his box?  This patient's procedure is 01/21/2019.

## 2019-01-14 ENCOUNTER — Telehealth: Payer: Self-pay | Admitting: General Surgery

## 2019-01-14 NOTE — Telephone Encounter (Signed)
Called Dr. Oneida Alar office to see if he was in office or if he had anyone covering his inbox, I was transferred to his nurse who did not answer the phone. Lvm instructing them to call us back with recommendations.

## 2019-01-14 NOTE — Telephone Encounter (Signed)
Called Dr Oneida Alar office and left a detailed message on his [employer]. Expressed we are needing to have an answer regarding Lance Collier discontinuing his plavix since Dr Ellyn Hack has also requested his assistance. We are needing to have him stop by this Sunday. I asked if they would call back tonite by 5pm for determination.

## 2019-01-14 NOTE — Telephone Encounter (Signed)
Per [place] with Dr Oneida Alar office she has given the message to [facility] who handles getting clearance and assured me she was told we needed to have a call back today.

## 2019-01-14 NOTE — Telephone Encounter (Signed)
Called Lance Collier and no answer. Contacted his wife Olive's phone and she gave the phone to Lance Collier. Explained to him that Dr Dione Plover authorized for Dr Oneida Alar for him to discontinue his plavix 5 days prior to his procedure. The patient verbalized understanding and will stop taking it on Sunday 01/16/2019

## 2019-01-14 NOTE — Telephone Encounter (Signed)
Received a fax from [facility] health vascular from [facility] LPN. Dr Oneida Alar is out of the office and Dr Donzetta Matters approved him to discontinue plavix for 5days. Please see fax and letter from [facility] in [place].

## 2019-01-14 NOTE — Telephone Encounter (Signed)
error 

## 2019-01-17 NOTE — Telephone Encounter (Signed)
   Primary Cardiologist: Glenetta Hew, MD  Chart reviewed as part of pre-operative protocol coverage. Patient was contacted 01/17/2019 in reference to pre-operative risk assessment for pending surgery as outlined below.  Lance Collier was last seen on 05/27/18 by Dr. Ellyn Hack. He has history of CAD s/p PCI then CABG, carotid artery disease, PAD, chronic diastolic CHF, CKD 3-4, DM, arthritis, GERD, HTN, HLD with low risk nuclear stress test managed medically at last OV. I spoke with patient who affirmed no new symptoms or change in medical status since last OV. Therefore, based on ACC/AHA guidelines, the patient would be at acceptable risk for the planned procedure without further cardiovascular testing.   Dr. Ellyn Hack has cleared patient to come off Plavix as requested for procedure. As below there were attempts to contact vascular team but it actually appears vascular surgery team has already relayed their recommendation in separate telephone notes. The patient indicates he has already received instructions from GI on holding the Plavix. Therefore clearance is taken care of.  I will route this recommendation to the requesting party via Epic fax function and remove from pre-op pool.  Please call with questions.  Charlie Pitter, PA-C 01/17/2019, 8:48 AM

## 2019-01-19 ENCOUNTER — Telehealth: Payer: Self-pay | Admitting: Internal Medicine

## 2019-01-19 NOTE — Telephone Encounter (Signed)
Patient advised he should use 7 capfuls (119 grams) Miralax mixed in Gatorade in the first part of his prep as instructed. He verbalizes understanding.

## 2019-01-19 NOTE — Telephone Encounter (Signed)
Patient called in wanting to ask question about the measurement of the miralax  Proc schedule for 07/31

## 2019-01-20 ENCOUNTER — Telehealth: Payer: Self-pay | Admitting: Internal Medicine

## 2019-01-20 NOTE — Telephone Encounter (Signed)

## 2019-01-21 ENCOUNTER — Other Ambulatory Visit: Payer: Self-pay

## 2019-01-21 ENCOUNTER — Encounter: Payer: Self-pay | Admitting: Internal Medicine

## 2019-01-21 ENCOUNTER — Ambulatory Visit (AMBULATORY_SURGERY_CENTER): Payer: Medicare Other | Admitting: Internal Medicine

## 2019-01-21 VITALS — BP 140/55 | HR 55 | Temp 98.0°F | Resp 12 | Ht 68.0 in | Wt 210.0 lb

## 2019-01-21 DIAGNOSIS — D123 Benign neoplasm of transverse colon: Secondary | ICD-10-CM

## 2019-01-21 DIAGNOSIS — Z86010 Personal history of colonic polyps: Secondary | ICD-10-CM | POA: Diagnosis not present

## 2019-01-21 DIAGNOSIS — D124 Benign neoplasm of descending colon: Secondary | ICD-10-CM

## 2019-01-21 DIAGNOSIS — D122 Benign neoplasm of ascending colon: Secondary | ICD-10-CM | POA: Diagnosis not present

## 2019-01-21 MED ORDER — SODIUM CHLORIDE 0.9 % IV SOLN: 500.0000 mL | Freq: Once | INTRAVENOUS | Status: DC

## 2019-01-21 NOTE — Progress Notes (Signed)
Temp taken by PJ VS taken by CW  

## 2019-01-21 NOTE — Progress Notes (Signed)
Called to room to assist during endoscopic procedure.  Patient ID and intended procedure confirmed with present staff. Received instructions for my participation in the procedure from the performing physician.  

## 2019-01-21 NOTE — Patient Instructions (Signed)
Resume plavix today.  Handouts given for polyps  YOU HAD AN ENDOSCOPIC PROCEDURE TODAY AT [facility]:   Refer to the procedure report that was given to you for any specific questions about what was found during the examination.  If the procedure report does not answer your questions, please call your gastroenterologist to clarify.  If you requested that your care partner not be given the details of your procedure findings, then the procedure report has been included in a sealed envelope for you to review at your convenience later.  YOU SHOULD EXPECT: Some feelings of bloating in the abdomen. Passage of more gas than usual.  Walking can help get rid of the air that was put into your GI tract during the procedure and reduce the bloating. If you had a lower endoscopy (such as a colonoscopy or flexible sigmoidoscopy) you may notice spotting of blood in your stool or on the toilet paper. If you underwent a bowel prep for your procedure, you may not have a normal bowel movement for a few days.  Please Note:  You might notice some irritation and congestion in your nose or some drainage.  This is from the oxygen used during your procedure.  There is no need for concern and it should clear up in a day or so.  SYMPTOMS TO REPORT IMMEDIATELY:   Following lower endoscopy (colonoscopy or flexible sigmoidoscopy):  Excessive amounts of blood in the stool  Significant tenderness or worsening of abdominal pains  Swelling of the abdomen that is new, acute  Fever of 100F or higher  For urgent or emergent issues, a gastroenterologist can be reached at any hour by calling [phone].   DIET:  We do recommend a small meal at first, but then you may proceed to your regular diet.  Drink plenty of fluids but you should avoid alcoholic beverages for 24 hours.  ACTIVITY:  You should plan to take it easy for the rest of today and you should NOT DRIVE or use heavy machinery until tomorrow (because  of the sedation medicines used during the test).    FOLLOW UP: Our staff will call the number listed on your records 48-72 hours following your procedure to check on you and address any questions or concerns that you may have regarding the information given to you following your procedure. If we do not reach you, we will leave a message.  We will attempt to reach you two times.  During this call, we will ask if you have developed any symptoms of COVID 19. If you develop any symptoms (ie: fever, flu-like symptoms, shortness of breath, cough etc.) before then, please call [phone].  If you test positive for Covid 19 in the 2 weeks post procedure, please call and report this information to [place].    If any biopsies were taken you will be contacted by phone or by letter within the next 1-3 weeks.  Please call us at [phone] if you have not heard about the biopsies in 3 weeks.    SIGNATURES/CONFIDENTIALITY: You and/or your care partner have signed paperwork which will be entered into your electronic medical record.  These signatures attest to the fact that that the information above on your After Visit Summary has been reviewed and is understood.  Full responsibility of the confidentiality of this discharge information lies with you and/or your care-partner.

## 2019-01-21 NOTE — Progress Notes (Signed)
A/ox3, pleased with MAC, report to RN 

## 2019-01-21 NOTE — Op Note (Signed)
[facility] Patient Name: Lance Collier Procedure Date: 01/21/2019 2:00 PM MRN: [id number] Endoscopist: Docia Chuck. Henrene Pastor , MD Age: 73 Referring MD:  Date of Birth: Nov 05, 1945 Gender: Male Account #: [id number] Procedure:                Colonoscopy with cold snare polypectomy x 7 Indications:              High risk colon cancer surveillance: Personal                            history of multiple (3 or more) adenomas. Previous                            examinations 2011, 2014, 2018 (poor prep), 2018 Medicines:                Monitored Anesthesia Care Procedure:                Pre-Anesthesia Assessment:                           - Prior to the procedure, a History and Physical                            was performed, and patient medications and                            allergies were reviewed. The patient's tolerance of                            previous anesthesia was also reviewed. The risks                            and benefits of the procedure and the sedation                            options and risks were discussed with the patient.                            All questions were answered, and informed consent                            was obtained. Prior Anticoagulants: The patient has                            taken Plavix (clopidogrel), last dose was 6 days                            prior to procedure. ASA Grade Assessment: II - A                            patient with mild systemic disease. After reviewing                            the risks and benefits, the patient was deemed in  satisfactory condition to undergo the procedure.                           After obtaining informed consent, the colonoscope                            was passed under direct vision. Throughout the                            procedure, the patient's blood pressure, pulse, and                            oxygen saturations were monitored continuously. The                             Colonoscope was introduced through the anus and                            advanced to the the cecum, identified by                            appendiceal orifice and ileocecal valve. The                            ileocecal valve, appendiceal orifice, and rectum                            were photographed. The quality of the bowel                            preparation was adequate. The colonoscopy was                            performed without difficulty though incredible                            spasm and difficulty retaining air throughout. The                            patient tolerated the procedure well. The bowel                            preparation used was MiraLAX then SUPREP via split                            dose instruction. Scope In: 2:37:55 PM Scope Out: 3:02:27 PM Scope Withdrawal Time: 0 hours 18 minutes 51 seconds  Total Procedure Duration: 0 hours 24 minutes 32 seconds  Findings:                 Seven polyps were found in the descending colon,                            transverse colon and ascending colon. The polyps  were 1 to 4 mm in size. These polyps were removed                            with a cold snare. Resection and retrieval were                            complete.                           The exam was otherwise without abnormality on                            direct and retroflexion views. Complications:            No immediate complications. Estimated blood loss:                            None. Estimated Blood Loss:     Estimated blood loss: none. Impression:               - Seven 1 to 4 mm polyps in the descending colon,                            in the transverse colon and in the ascending colon,                            removed with a cold snare. Resected and retrieved.                           - The examination was otherwise normal on direct                            and  retroflexion views. Recommendation:           - Repeat colonoscopy in 3 years for surveillance                            (EXTENSIVE PREP and AVOID SEEDS and ROUGHAGE at                            least 5 days prior to the exam).                           - Patient has a contact number available for                            emergencies. The signs and symptoms of potential                            delayed complications were discussed with the                            patient. Return to normal activities tomorrow.  Written discharge instructions were provided to the                            patient.                           - Resume previous diet.                           - Continue present medications.                           - Await pathology results. Docia Chuck. Henrene Pastor, MD 01/21/2019 3:15:55 PM This report has been signed electronically.

## 2019-01-25 ENCOUNTER — Telehealth: Payer: Self-pay | Admitting: *Deleted

## 2019-01-25 ENCOUNTER — Encounter: Payer: Self-pay | Admitting: Internal Medicine

## 2019-01-25 NOTE — Telephone Encounter (Signed)
  Follow up Call-  Call back number 01/21/2019 01/06/2017 12/25/2016  Post procedure Call Back phone  # [phone]  Permission to leave phone message Yes Yes Yes  Some recent data might be hidden     Patient questions:  Do you have a fever, pain , or abdominal swelling? No. Pain Score  0 *  Have you tolerated food without any problems? Yes.    Have you been able to return to your normal activities? Yes.    Do you have any questions about your discharge instructions: Diet   No. Medications  No. Follow up visit  No.  Do you have questions or concerns about your Care? No.  Actions: * If pain score is 4 or above: No action needed, pain <4.  1. Have you developed a fever since your procedure? NO  2.   Have you had an respiratory symptoms (SOB or cough) since your procedure? NO  3.   Have you tested positive for COVID 19 since your procedure NO  4.   Have you had any family members/close contacts diagnosed with the COVID 19 since your procedure?  NO   If yes to any of these questions please route to Joylene John, RN and Alphonsa Gin, RN.

## 2019-02-02 ENCOUNTER — Other Ambulatory Visit: Payer: Self-pay | Admitting: Cardiology

## 2019-02-10 DIAGNOSIS — R609 Edema, unspecified: Secondary | ICD-10-CM | POA: Diagnosis not present

## 2019-02-10 DIAGNOSIS — Z794 Long term (current) use of insulin: Secondary | ICD-10-CM | POA: Diagnosis not present

## 2019-02-10 DIAGNOSIS — N184 Chronic kidney disease, stage 4 (severe): Secondary | ICD-10-CM | POA: Diagnosis not present

## 2019-02-10 DIAGNOSIS — I129 Hypertensive chronic kidney disease with stage 1 through stage 4 chronic kidney disease, or unspecified chronic kidney disease: Secondary | ICD-10-CM | POA: Diagnosis not present

## 2019-02-10 DIAGNOSIS — I2510 Atherosclerotic heart disease of native coronary artery without angina pectoris: Secondary | ICD-10-CM | POA: Diagnosis not present

## 2019-02-10 DIAGNOSIS — Z23 Encounter for immunization: Secondary | ICD-10-CM | POA: Diagnosis not present

## 2019-02-10 DIAGNOSIS — I739 Peripheral vascular disease, unspecified: Secondary | ICD-10-CM | POA: Diagnosis not present

## 2019-02-10 DIAGNOSIS — I10 Essential (primary) hypertension: Secondary | ICD-10-CM | POA: Diagnosis not present

## 2019-02-10 DIAGNOSIS — E1139 Type 2 diabetes mellitus with other diabetic ophthalmic complication: Secondary | ICD-10-CM | POA: Diagnosis not present

## 2019-02-10 DIAGNOSIS — E1122 Type 2 diabetes mellitus with diabetic chronic kidney disease: Secondary | ICD-10-CM | POA: Diagnosis not present

## 2019-02-10 DIAGNOSIS — D631 Anemia in chronic kidney disease: Secondary | ICD-10-CM | POA: Diagnosis not present

## 2019-02-10 DIAGNOSIS — D649 Anemia, unspecified: Secondary | ICD-10-CM | POA: Diagnosis not present

## 2019-03-07 ENCOUNTER — Other Ambulatory Visit: Payer: Self-pay

## 2019-03-07 ENCOUNTER — Encounter: Payer: Self-pay | Admitting: Cardiology

## 2019-03-07 ENCOUNTER — Ambulatory Visit (INDEPENDENT_AMBULATORY_CARE_PROVIDER_SITE_OTHER): Payer: Medicare Other | Admitting: Cardiology

## 2019-03-07 VITALS — BP 148/65 | HR 53 | Temp 97.2°F | Ht 68.0 in | Wt 214.2 lb

## 2019-03-07 DIAGNOSIS — Z9861 Coronary angioplasty status: Secondary | ICD-10-CM | POA: Diagnosis not present

## 2019-03-07 DIAGNOSIS — I10 Essential (primary) hypertension: Secondary | ICD-10-CM

## 2019-03-07 DIAGNOSIS — R600 Localized edema: Secondary | ICD-10-CM | POA: Diagnosis not present

## 2019-03-07 DIAGNOSIS — N183 Chronic kidney disease, stage 3 unspecified: Secondary | ICD-10-CM

## 2019-03-07 DIAGNOSIS — I2510 Atherosclerotic heart disease of native coronary artery without angina pectoris: Secondary | ICD-10-CM | POA: Diagnosis not present

## 2019-03-07 DIAGNOSIS — I6523 Occlusion and stenosis of bilateral carotid arteries: Secondary | ICD-10-CM

## 2019-03-07 DIAGNOSIS — E1151 Type 2 diabetes mellitus with diabetic peripheral angiopathy without gangrene: Secondary | ICD-10-CM | POA: Diagnosis not present

## 2019-03-07 DIAGNOSIS — I25709 Atherosclerosis of coronary artery bypass graft(s), unspecified, with unspecified angina pectoris: Secondary | ICD-10-CM

## 2019-03-07 DIAGNOSIS — I5032 Chronic diastolic (congestive) heart failure: Secondary | ICD-10-CM

## 2019-03-07 DIAGNOSIS — E785 Hyperlipidemia, unspecified: Secondary | ICD-10-CM

## 2019-03-07 DIAGNOSIS — I739 Peripheral vascular disease, unspecified: Secondary | ICD-10-CM

## 2019-03-07 DIAGNOSIS — Z794 Long term (current) use of insulin: Secondary | ICD-10-CM | POA: Diagnosis not present

## 2019-03-07 MED ORDER — ISOSORBIDE MONONITRATE ER 30 MG PO TB24: 30.0000 mg | ORAL_TABLET | Freq: Every evening | ORAL | 3 refills | Status: DC

## 2019-03-07 NOTE — Patient Instructions (Addendum)
Medication Instructions:   INCREASE ISOSORBIDE MONO - 60 MG IN THE MORNING AND 30 MG IN THE EVENING  If you need a refill on your cardiac medications before your next appointment, please call your pharmacy.   Lab work:  NOT NEEDED  Testing/Procedures: NOT NEEDED   Follow-Up: At [facility], you and your health needs are our priority.  As part of our continuing mission to provide you with exceptional heart care, we have created designated Provider Care Teams.  These Care Teams include your primary Cardiologist (physician) and Advanced Practice Providers (APPs -  Physician Assistants and Nurse Practitioners) who all work together to provide you with the care you need, when you need it. . You will need a follow up appointment in  6  months.  Please call our office 2 months in advance to schedule this appointment.  You may see Glenetta Hew, MD or one of the following Advanced Practice Providers on your designated Care Team:   . Rosaria Ferries, PA-C . Jory Sims, DNP, ANP  Any Other Special Instructions Will Be Listed Below (If Applicable).

## 2019-03-07 NOTE — Progress Notes (Signed)
1 a little bit on and swelling no better   PCP: Reynold Bowen, MD  Clinic Note: Chief Complaint  Patient presents with   Follow-up    Mostly limited by claudication   Coronary Artery Disease    HPI: Lance Collier is a 73 y.o. male with a PMH notable for MV CAD-PCI-> CABG-PCI, Carotid & PAD, HFpEF, CKD-4 & DM2-Insulin who presents for ~9 month f/u.   CAD:   December 2012: 2 overlapping DES to RCA;   January 2015: CABG [facility], SVG-Cx);   April 2016 -DES PCI to SVG-RCA.  PAD -most recently left femoral endarterectomy with bilateral common iliac stenting and profundoplasty September 2019 -> unfortunately not much notable improvement in symptoms  Carotid Artery Disease - Left Carotid Endarterectomy November 2017  His last visit with me was in Dec 2019 - noting [place] f/u  Recent Hospitalizations: None   None  Studies Personally Reviewed - (if available, images/films reviewed: From Epic Chart or Care Everywhere)  Myoview 04/29/2018: Non-gated study with small fixed moderate intensity defect in the apical inferior wall suspicious for either small area of infarction versus attenuation.  LOW RISK.  Interval History: Lance Collier returns today again noting that he is really limited by claudication.  He says his legs hurt him just about with any particular activity.  He is also gained a bit of weight during the COVID-19 lockdown.  He has not been out of the gym exercises that he usually does.  As such, with the weight gain and deconditioning, he does note some exertional dyspnea, but it is mostly the claudication that limits him.  Both legs are bad.  He denies any chest pain or pressure with rest or exertion.  He does have some mild lower extremity edema but no PND orthopnea.  He denies any rapid irregular heartbeats or palpitations.  No syncope/near syncope or TIA/amaurosis fugax.  He is still making urine, and takes very high doses of diuretic to do so.  He tells me that  with his renal function is diffuse, he really has no intention whatsoever of considering attempted revascularization of his peripheral vascular disease.  Slowly putting on weight obesity.  He is very happy with his lipid results having started Repatha.  He tells me that up until the last couple days his blood pressures have been doing pretty well but they have gone up quite a bit in the last few days.  He denies any headache or blurred vision.  No dizziness.  ROS: A comprehensive was performed.  Pertinent positives and negatives as above Review of Systems  Constitutional: Positive for malaise/fatigue (Energy level seems to be okay, just doing anything more than puttering around the house is a chore because of his claudication.). Negative for weight loss.  HENT: Negative for congestion.   Respiratory: Negative for cough, shortness of breath and wheezing.   Cardiovascular: Positive for claudication (Essentially symptom limiting.).  Gastrointestinal: Negative for abdominal pain, blood in stool, heartburn and melena.  Genitourinary: Positive for frequency (Because of the Lasix he takes). Negative for hematuria.       Nocturia  Musculoskeletal: Positive for joint pain. Negative for falls.  Neurological: Positive for dizziness (Balance issues.) and tingling (Peripheral neuropathy). Negative for weakness.  Psychiatric/Behavioral: Negative for memory loss. The patient has insomnia (Just does not sleep well at all.).        He denies depression, but seems to have a somewhat depressed mood.  A little better today than last visit.  All other systems reviewed and are negative.  I have reviewed and (if needed) personally updated the patient's problem list, medications, allergies, past medical and surgical history, social and family history.   Past Medical History:  Diagnosis Date   Anemia    Aortic sclerosis  - without Stenosis  March 2014   Echo: Aortic sclerosis without stenosis. EF 55-60% with  normal WM; mild concentric hypertrophy with normal relaxation.   Arthritis    CAD (coronary artery disease), autologous vein bypass graft 09/2014   10/03/14: Cath for Class III-IV Angina -- 80% ostial-proximal SVG-RPDA (minimal flow down native PDA from native RCA with 50% proximal and distal, patent LIMA-LAD, SVG-OM with retrograde filling of OM 2. --> 10/12/14: Staged PCI of SVG-RPDA - Xience DES  3.0 mm x 38 mm; (3.4  - 3.3 mm)   CAD S/P percutaneous coronary angioplasty 06/12/2011; 09/2104   a) 2012: Complex PCI mRCA ([place]) --  2 overlapping Promus element DES stents.; 09/2014:  ost-prox SVG-rPDA - Xience DES 3.0 mm x 38 mm; (3.4  - 3.3 mm)   CHF (congestive heart failure) (HCC)    Childhood asthma    CKD (chronic kidney disease), stage III ([place])    "mild; related to diabetes" (7/37/1062)   Complication of anesthesia    "I've had name recall recognition problems since my last anesthesia"   DM (diabetes mellitus) type II uncontrolled with eye manifestation ([place]) 2013   Diabetic retinopathy with retinal hemorrhages since;, recurrent in August 2014 treated with Avastin shots   Dyspnea    mainly with exertion   GERD (gastroesophageal reflux disease)    Heart murmur    questionable   High cholesterol    Hypertension    Migraine ~ 2004   "once"   PAD (peripheral artery disease) (HCC)    with claudication   PONV (postoperative nausea and vomiting)    s/p tonsillectomy   S/P CABG x 3 07/01/2013   a. 07/01/2013 (Cath for Crescendo Unstable Angina) --> [name]: For LM disease; LIMA-LAD, SVG-RCA, SCG-Cx; b. 09/2014 Cath/Staged PCI: patent LIMA->LAD, VG->OM1/RI, VG->RCA 80% (3.0x38 Xience Alpine DES on 10/12/2014).   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  Likely thromboembolic event with PDA occlusion. 06/12/2011   Post Complex PCI, pt states no mi   Wears glasses    reading    Past Surgical History:  Procedure  Laterality Date   ABDOMINAL AORTOGRAM W/LOWER EXTREMITY N/A 02/05/2018   Procedure: ABDOMINAL AORTOGRAM W/LOWER EXTREMITY;  Surgeon: Elam Dutch, MD;  Location: [facility];  Service: Cardiovascular;  Laterality: N/A;   CARDIAC CATHETERIZATION  06/28/2013   ostial LM disese, RCA ~40-50%ISR   COLONOSCOPY     CORONARY ANGIOPLASTY WITH STENT PLACEMENT Right September 2012   2 overlapping Promus DES 2.7 mm at 12 mm x2; postdilated to 3 mm   CORONARY ARTERY BYPASS GRAFT N/A 07/01/2013   Procedure: CORONARY ARTERY BYPASS GRAFTING (CABG);  Surgeon: Grace Isaac, MD;  Location: [place];  Service: Open Heart Surgery;  Laterality: N/A;  Times 3 using left internal mammary artery and endoscopically harvested bilateral saphenous vein   ENDARTERECTOMY Left 05/21/2016   Procedure: LEFT CAROTID ENDARTERECTOMY;  Surgeon: Elam Dutch, MD;  Location: [place];  Service: Vascular;  Laterality: Left;   ENDARTERECTOMY FEMORAL Left 02/23/2018   Procedure: LEFT COMMON FEMORAL ENDARTERECTOMY;  Surgeon: Elam Dutch, MD;  Location: [place];  Service: Vascular;  Laterality: Left;  EYE SURGERY Bilateral    ioc for cataracts   INSERTION OF ILIAC STENT Bilateral 02/23/2018   Procedure: INSERTION OF BILATERAL COMMON ILIAC STENTS WITH VIABAHN STENTS;  Surgeon: Elam Dutch, MD;  Location: [place];  Service: Vascular;  Laterality: Bilateral;   INTRAOPERATIVE TRANSESOPHAGEAL ECHOCARDIOGRAM N/A 07/01/2013   Procedure: INTRAOPERATIVE TRANSESOPHAGEAL ECHOCARDIOGRAM;  Surgeon: Grace Isaac, MD;  Location: [place];  Service: Open Heart Surgery;  Laterality: N/A;   LEFT AND RIGHT HEART CATHETERIZATION WITH CORONARY/GRAFT ANGIOGRAM N/A 10/03/2014   Procedure: LEFT AND RIGHT HEART CATHETERIZATION WITH Beatrix Fetters;  Surgeon: [place]ie Man, MD;  Location: [facility] C[facility] LAB;  Service: Cardiovascular;  Laterality: N/A;   LEFT HEART CATHETERIZATION WITH CORONARY ANGIOGRAM N/A 06/11/2011   Procedure:  LEFT HEART CATHETERIZATION WITH CORONARY ANGIOGRAM;  Surgeon: [place]ulton Reek, MD;  Location: [facility] CATH LAB;  Service: Cardiovascular;  Laterality: N/A;   LEFT HEART CATHETERIZATION WITH CORONARY ANGIOGRAM N/A 06/28/2013   Procedure: LEFT HEART CATHETERIZATION WITH CORONARY ANGIOGRAM;  Surgeon: [place]ie Man, MD;  Location: [facility] CATH LAB;  Service: Cardiovascular;  Laterality: N/A;   NM MYOVIEW LTD  04/29/2018   LEXISCAN: Non-gated study with small fixed moderate intensity defect in the apical inferior wall suspicious for either small area of infarction versus attenuation (no change from previous study).  LOW RISK.    PATCH ANGIOPLASTY Left 05/21/2016   Procedure: PATCH ANGIOPLASTY USING HEMASHIELD PLATINUM FINESSE 0.3in x 3 in;  Surgeon: Elam Dutch, MD;  Location: [place];  Service: Vascular;  Laterality: Left;   PATCH ANGIOPLASTY Left 02/23/2018   Procedure: PATCH ANGIOPLASTY LEFT COMMON FEMORAL ARTERY;  Surgeon: Elam Dutch, MD;  Location: [facility] OR;  Service: Vascular;  Laterality: Left;   PERCUTANEOUS CORONARY STENT INTERVENTION (PCI-S)  06/11/2011   Procedure: PERCUTANEOUS CORONARY STENT INTERVENTION (PCI-S);  Surgeon: Fulton Reek, MD;  Location: [facility] CATH LAB;  Service: Cardiovascular;;   PERCUTANEOUS CORONARY STENT INTERVENTION (PCI-S) N/A 10/12/2014   Procedure: PERCUTANEOUS CORONARY STENT INTERVENTION (PCI-S);  Surgeon: [place]ie Man, MD;  Location: [facility] CATH LAB;  Ostial-Prox SVG-RCA Xience Alpine DES 3.0 mm x 38 mm; (3.4  - 3.3 mm)   RETINAL LASER PROCEDURE Bilateral    "more than once"    TONSILLECTOMY  as child   TRANSTHORACIC ECHOCARDIOGRAM  03/2017    EF 55-60%.  Normal wall thickness.  No regional wall motion normalities.  GR 2 DD.  Mild-moderate calcified aortic valve.  Mild RV systolic dysfunction.  Moderate RV dilation.  Moderately increased PA pressures.    Current Meds  Medication Sig   acetaminophen (TYLENOL) 325 MG tablet Take 650 mg by mouth daily as needed  for mild pain or headache.    amLODipine (NORVASC) 10 MG tablet Take 1 tablet by mouth daily.   aspirin EC 81 MG tablet Take 1 tablet (81 mg total) by mouth every other day. (Patient taking differently: Take 81 mg by mouth See admin instructions. Take 81mg  once daily on Tues, Thurs, Sat, and Sun)   carvedilol (COREG) 12.5 MG tablet Take 1 tablet (12.5 mg total) by mouth 2 (two) times daily.   clopidogrel (PLAVIX) 75 MG tablet TAKE 1 TABLET DAILY WITH BREAKFAST.   Continuous Blood Gluc Sensor (FREESTYLE LIBRE 14 DAY SENSOR) MISC MONITOR BS CONTINUOUSLY  CHANGE Q 14 DAYS.   docusate sodium (STOOL SOFTENER) 100 MG capsule Take 100 mg by mouth daily as needed for mild constipation.    Evolocumab (REPATHA SURECLICK) 416 MG/ML SOAJ Inject 140 mg  into the skin every 14 (fourteen) days.   fenofibrate 160 MG tablet TAKE 1 TABLET ONCE DAILY.   furosemide (LASIX) 80 MG tablet    HUMALOG KWIKPEN 200 UNIT/ML SOPN Inject 20-28 Units into the skin See admin instructions. Use 20 units (base) per meal increase units given by 1 unit for every 15 increment over 150 (I.E. Blood glucose reading 165=21 units, 180=22 units, etc. )   hydrALAZINE (APRESOLINE) 100 MG tablet TAKE 1 TABLET BY MOUTH 3 TIMES A DAY.   isosorbide mononitrate (IMDUR) 60 MG 24 hr tablet TAKE 1 TABLET ONCE DAILY.   pantoprazole (PROTONIX) 40 MG tablet Take 1 tablet (40 mg total) by mouth daily.   rosuvastatin (CRESTOR) 10 MG tablet TAKE 1 TABLET ON TUESDAY, THURSDAY, SATURDAY AND SUNDAY.   TOUJEO MAX SOLOSTAR 300 UNIT/ML SOPN Inject 68 Units into the skin 2 (two) times daily.    VASCEPA 1 G CAPS Take 2 g by mouth 2 (two) times daily.     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  He has 1 account that he still trying to complete.     He has social alcoholic beverages but is trying to cut that back and does not smoke.       He tells me he is a sucker for chips but does not eat sweets because of diabetes.     family history includes Breast cancer in his mother and sister; Colon polyps in his father; Hyperlipidemia in his father; Hypertension in his father; Liver cancer in his maternal grandmother; Lung cancer in his sister; Throat cancer in his father.  Wt Readings from Last 3 Encounters:  03/07/19 214 lb 3.2 oz (97.2 kg)  01/21/19 210 lb (95.3 kg)  01/04/19 210 lb (95.3 kg)     PHYSICAL EXAM BP (!) 148/65    Pulse (!) 53    Temp (!) 97.2 F (36.2 C)    Ht 5\' 8"  (1.727 m)    Wt 214 lb 3.2 oz (97.2 kg)    SpO2 97%    BMI 32.57 kg/m  Physical Exam  Constitutional: He is oriented to person, place, and time. He appears well-developed and well-nourished.  Mild to moderately obese (truncal). Well-groomed.  Starting to look older than his stated age.  HENT:  Head: Normocephalic and atraumatic.  Left eye is still mildly bloodshot, but no longer puffy/swollen.  Eyes:  Eyes look both relatively clear bilaterally  Neck: Normal range of motion. Neck supple. No hepatojugular reflux ([place]) and no JVD present. Carotid bruit is present (Bilateral).  Cardiovascular: Normal rate. A regularly irregular rhythm present. Frequent extrasystoles are present. PMI is not displaced. Exam reveals distant heart sounds and decreased pulses (Bilateral pedal pulses). Exam reveals no gallop.  Murmur heard.  Low-pitched harsh crescendo-decrescendo early systolic  murmur is present with a grade of 2/6. High-pitched blowing early systolic murmur is also present at the apex radiating to the apex. Pulses:      Femoral pulses are on the right side with bruit and on the left side with bruit. Normal S1 and physiologically split S2.  Pulmonary/Chest: Breath sounds normal. No respiratory distress. He has no wheezes. He has no rales.  Mildly diminished basal sounds, but likely reduced effort.  Abdominal: Soft. Bowel sounds are normal. He exhibits no distension. There is no abdominal tenderness. There is no rebound.  Truncal obesity, no HSM  Musculoskeletal: Normal range of motion.        General: Edema (Trivial bilateral) present.     Comments: Slow, deliberate gait.  Neurological: He is alert and oriented to person, place, and time.  Psychiatric: He has a normal mood and affect. His behavior is normal. Judgment and thought content normal.  As usual he seems to have a somewhat subdued mood. Not depressed however  Vitals reviewed.   Adult ECG Report  Rate: 53 ;  Rhythm: sinus bradycardia and 1 degree AVB.  Mild LVH criteria with repolarization changes.  Nonspecific ST and T wave changes.;   Narrative Interpretation: Stable EKG.   Other studies Reviewed: Additional studies/ records that were reviewed today include:  Recent Labs: last labs available -  Lab Results  Component Value Date   CHOL 150 12/02/2018   HDL 23 (L) 12/02/2018   LDLCALC 59 12/02/2018   TRIG 341 (H) 12/02/2018   CHOLHDL 6.5 (H) 12/02/2018   --As of February 10, 2019: A1c 7.1.  Hgb 10.7.  Cr 2.75.  K+ 4.2.  TSH 4.66.   ASSESSMENT / PLAN: Problem List Items Addressed This Visit    CAD S/P PCI SVG-RCA Xience Alpine DES 3.0 mm x 38 mm (ostial-prox) - 3.63mm (Chronic)    DES PCI to the SVG-RCA just over a year after CABG.  Has had negative ischemic evaluations since with most recent Myoview in 2019 being negative.  He is actually more limited by claudication then any cardiac symptoms.   For his dyspnea we did have a stress test last year that was negative.  He remains on aspirin and Plavix as much because of his peripheral and carotid disease as well as current coronary disease.  He is on low-dose statin which we can reduce because of the Repatha now. I will increase his Imdur to 90 mg daily for little more blood pressure control and some antianginal effect as well as potentially in the claudication. He is on stable dose of carvedilol which I can increase because of bradycardia as well as amlodipine.  We are not using ACE inhibitor/ARB because of his renal insufficiency.  He is on hydralazine/nitrate.  Okay to hold Plavix for procedures.       Relevant Medications   amLODipine (NORVASC) 10 MG tablet   isosorbide mononitrate (IMDUR) 30 MG 24 hr tablet   Other Relevant Orders   EKG 12-Lead (Completed)   Chronic diastolic CHF (congestive heart failure), NYHA class 2 (HCC) (Chronic)    Pretty stable class II symptoms.  He denies any PND orthopnea and only mild edema.  On combination of hydralazine nitrate for afterload reduction along with carvedilol a stable dose.  He takes a lot of diuretic that is as much because of his renal insufficiency as it is anything else.  He is currently taking 160 mg twice daily Lasix.  Lasix being managed by nephrology.      Relevant Medications   amLODipine (NORVASC) 10 MG tablet   isosorbide mononitrate (IMDUR) 30 MG 24 hr tablet   Other Relevant Orders   EKG 12-Lead (Completed)   CKD (chronic kidney disease), stage III (HCC) (Chronic)    Maintaining steady renal function.  Requiring high doses of diuretic. I do not think he is very receptive to the idea of possible dialysis in the future.  We are definitely trying to the best we can to avoid invasive procedures especially with contrast.  He indicates that he really does not desire to do more inpatient procedures.      Coronary artery disease involving coronary bypass graft with angina  pectoris (HCC) - Primary (Chronic)   Relevant Medications   amLODipine (NORVASC) 10 MG tablet   isosorbide mononitrate (IMDUR) 30 MG 24 hr tablet   Other Relevant Orders   EKG 12-Lead (Completed)   Diabetes mellitus ([place]) (Chronic)    He is now on insulin, but would consider SGLT2 inhibitor given his coronary disease.      Edema of both legs (Chronic)    He probably has some venous stasis changes but also is on amlodipine and hydralazine which can cause it. Relatively well-controlled on high-dose diuretic.      Essential hypertension (Chronic)    Blood pressure is higher today than I would like to be.  He says is been a little better controlled than this.  Plan for now will be to basically leave the medicines that he is on max doses of alone.  We will increase Imdur up to 90 mg daily.  I really do not want to add another medicine.  Do not want to get too hypotensive because of his renal insufficiency.      Relevant Medications   amLODipine (NORVASC) 10 MG tablet   isosorbide mononitrate (IMDUR) 30 MG 24 hr tablet   Hyperlipidemia with target LDL less than 70; intolerance to atorvastatin and simvastatin (Chronic)    Lipids look great with exception of triglycerides.  I think we can back his rosuvastatin down to 2 days a week.  Continue Vascepa and Repatha.      Relevant Medications   amLODipine (NORVASC) 10 MG tablet   isosorbide mononitrate (IMDUR) 30 MG 24 hr tablet   PAD (peripheral artery disease) both Rt and Lt disease with Lt ICA of 60-79% (Chronic)    He continues to follow-up with vascular surgery.  At this point, he is reluctant to undergo any further invasive procedures for revascularization.  He just has resigned himself to dealing with lifestyle limiting claudication.  He is hoping that maybe being on to do water walking and/or elliptical/stationary bicycle that will help some.  He does not have as much leg pain with those activities.  He is on pretty stable regimen.  Not  currently on Pletal but did not really get much benefit from that.      Relevant Medications   amLODipine (NORVASC) 10 MG tablet   isosorbide mononitrate (IMDUR) 30 MG 24 hr tablet       I spent close to 25 minutes with the patient.  >50% of the time was spent in direct patient consultation.  Current medicines are reviewed at length with the patient today. (+/- concerns) n/a The following changes have been made: n/a  Patient Instructions  Medication Instructions:   INCREASE ISOSORBIDE MONO - 60 MG IN THE MORNING AND 30 MG IN THE EVENING  If  you need a refill on your cardiac medications before your next appointment, please call your pharmacy.   Lab work:  NOT NEEDED  Testing/Procedures: NOT NEEDED   Follow-Up: At [facility], you and your health needs are our priority.  As part of our continuing mission to provide you with exceptional heart care, we have created designated Provider Care Teams.  These Care Teams include your primary Cardiologist (physician) and Advanced Practice Providers (APPs -  Physician Assistants and Nurse Practitioners) who all work together to provide you with the care you need, when you need it.  You will need a follow up appointment in  6  months.  Please call our office 2 months in advance to schedule this appointment.  You may see Glenetta Hew, MD or one of the following Advanced Practice Providers on your designated Care Team:    Rosaria Ferries, PA-C  Jory Sims, DNP, ANP  Any Other Special Instructions Will Be Listed Below (If Applicable).   Studies Ordered:   Orders Placed This Encounter  Procedures   EKG 12-Lead      Glenetta Hew, M.D., M.S. Interventional Cardiologist   Pager # [phone] Phone # [phone] [address] [place],  [address]

## 2019-03-08 ENCOUNTER — Encounter: Payer: Self-pay | Admitting: Cardiology

## 2019-03-08 NOTE — Assessment & Plan Note (Signed)
Pretty stable class II symptoms.  He denies any PND orthopnea and only mild edema.  On combination of hydralazine nitrate for afterload reduction along with carvedilol a stable dose.  He takes a lot of diuretic that is as much because of his renal insufficiency as it is anything else.  He is currently taking 160 mg twice daily Lasix.  Lasix being managed by nephrology.

## 2019-03-08 NOTE — Assessment & Plan Note (Signed)
DES PCI to the SVG-RCA just over a year after CABG.  Has had negative ischemic evaluations since with most recent Myoview in 2019 being negative.  He is actually more limited by claudication then any cardiac symptoms.  For his dyspnea we did have a stress test last year that was negative.  He remains on aspirin and Plavix as much because of his peripheral and carotid disease as well as current coronary disease.  He is on low-dose statin which we can reduce because of the Repatha now. I will increase his Imdur to 90 mg daily for little more blood pressure control and some antianginal effect as well as potentially in the claudication. He is on stable dose of carvedilol which I can increase because of bradycardia as well as amlodipine.  We are not using ACE inhibitor/ARB because of his renal insufficiency.  He is on hydralazine/nitrate.  Okay to hold Plavix for procedures.

## 2019-03-08 NOTE — Assessment & Plan Note (Signed)
Blood pressure is higher today than I would like to be.  He says is been a little better controlled than this.  Plan for now will be to basically leave the medicines that he is on max doses of alone.  We will increase Imdur up to 90 mg daily.  I really do not want to add another medicine.  Do not want to get too hypotensive because of his renal insufficiency.

## 2019-03-08 NOTE — Assessment & Plan Note (Signed)
Maintaining steady renal function.  Requiring high doses of diuretic. I do not think he is very receptive to the idea of possible dialysis in the future.  We are definitely trying to the best we can to avoid invasive procedures especially with contrast.  He indicates that he really does not desire to do more inpatient procedures.

## 2019-03-08 NOTE — Assessment & Plan Note (Signed)
He continues to follow-up with vascular surgery.  At this point, he is reluctant to undergo any further invasive procedures for revascularization.  He just has resigned himself to dealing with lifestyle limiting claudication.  He is hoping that maybe being on to do water walking and/or elliptical/stationary bicycle that will help some.  He does not have as much leg pain with those activities.  He is on pretty stable regimen.  Not currently on Pletal but did not really get much benefit from that.

## 2019-03-08 NOTE — Assessment & Plan Note (Signed)
He is now on insulin, but would consider SGLT2 inhibitor given his coronary disease.

## 2019-03-08 NOTE — Assessment & Plan Note (Signed)
Lipids look great with exception of triglycerides.  I think we can back his rosuvastatin down to 2 days a week.  Continue Vascepa and Repatha.

## 2019-03-08 NOTE — Assessment & Plan Note (Signed)
He probably has some venous stasis changes but also is on amlodipine and hydralazine which can cause it. Relatively well-controlled on high-dose diuretic.

## 2019-04-11 DIAGNOSIS — L820 Inflamed seborrheic keratosis: Secondary | ICD-10-CM | POA: Diagnosis not present

## 2019-04-11 DIAGNOSIS — L570 Actinic keratosis: Secondary | ICD-10-CM | POA: Diagnosis not present

## 2019-04-11 DIAGNOSIS — X32XXXD Exposure to sunlight, subsequent encounter: Secondary | ICD-10-CM | POA: Diagnosis not present

## 2019-05-05 ENCOUNTER — Other Ambulatory Visit: Payer: Self-pay | Admitting: Cardiology

## 2019-05-11 ENCOUNTER — Other Ambulatory Visit: Payer: Self-pay | Admitting: Cardiology

## 2019-05-12 DIAGNOSIS — N184 Chronic kidney disease, stage 4 (severe): Secondary | ICD-10-CM | POA: Diagnosis not present

## 2019-05-12 DIAGNOSIS — I10 Essential (primary) hypertension: Secondary | ICD-10-CM | POA: Diagnosis not present

## 2019-05-12 DIAGNOSIS — E1139 Type 2 diabetes mellitus with other diabetic ophthalmic complication: Secondary | ICD-10-CM | POA: Diagnosis not present

## 2019-05-12 DIAGNOSIS — Z794 Long term (current) use of insulin: Secondary | ICD-10-CM | POA: Diagnosis not present

## 2019-05-18 DIAGNOSIS — I129 Hypertensive chronic kidney disease with stage 1 through stage 4 chronic kidney disease, or unspecified chronic kidney disease: Secondary | ICD-10-CM | POA: Diagnosis not present

## 2019-05-18 DIAGNOSIS — D649 Anemia, unspecified: Secondary | ICD-10-CM | POA: Diagnosis not present

## 2019-05-18 DIAGNOSIS — E1122 Type 2 diabetes mellitus with diabetic chronic kidney disease: Secondary | ICD-10-CM | POA: Diagnosis not present

## 2019-05-18 DIAGNOSIS — N184 Chronic kidney disease, stage 4 (severe): Secondary | ICD-10-CM | POA: Diagnosis not present

## 2019-05-18 DIAGNOSIS — R609 Edema, unspecified: Secondary | ICD-10-CM | POA: Diagnosis not present

## 2019-05-18 DIAGNOSIS — I2510 Atherosclerotic heart disease of native coronary artery without angina pectoris: Secondary | ICD-10-CM | POA: Diagnosis not present

## 2019-05-18 DIAGNOSIS — I739 Peripheral vascular disease, unspecified: Secondary | ICD-10-CM | POA: Diagnosis not present

## 2019-06-14 ENCOUNTER — Other Ambulatory Visit: Payer: Self-pay | Admitting: Cardiology

## 2019-06-16 ENCOUNTER — Other Ambulatory Visit: Payer: Self-pay

## 2019-06-16 DIAGNOSIS — I739 Peripheral vascular disease, unspecified: Secondary | ICD-10-CM

## 2019-06-22 ENCOUNTER — Other Ambulatory Visit: Payer: Self-pay | Admitting: Cardiology

## 2019-06-22 ENCOUNTER — Telehealth (HOSPITAL_COMMUNITY): Payer: Self-pay

## 2019-06-22 NOTE — Telephone Encounter (Signed)

## 2019-06-22 NOTE — Telephone Encounter (Signed)
Rx(s) sent to pharmacy electronically.  

## 2019-06-23 ENCOUNTER — Encounter (HOSPITAL_COMMUNITY): Payer: Medicare Other

## 2019-06-23 ENCOUNTER — Encounter: Payer: Self-pay | Admitting: Vascular Surgery

## 2019-06-23 ENCOUNTER — Ambulatory Visit (INDEPENDENT_AMBULATORY_CARE_PROVIDER_SITE_OTHER): Payer: Medicare Other | Admitting: Vascular Surgery

## 2019-06-23 ENCOUNTER — Other Ambulatory Visit: Payer: Self-pay

## 2019-06-23 ENCOUNTER — Ambulatory Visit: Payer: Medicare Other | Admitting: Vascular Surgery

## 2019-06-23 ENCOUNTER — Ambulatory Visit (HOSPITAL_COMMUNITY)
Admission: RE | Admit: 2019-06-23 | Discharge: 2019-06-23 | Disposition: A | Discharge status: Home / Self Care | Payer: Medicare Other | Source: Ambulatory Visit | Attending: Vascular Surgery | Admitting: Vascular Surgery

## 2019-06-23 VITALS — BP 126/57 | HR 58 | Temp 98.0°F | Resp 20 | Ht 68.0 in | Wt 211.0 lb

## 2019-06-23 DIAGNOSIS — I739 Peripheral vascular disease, unspecified: Secondary | ICD-10-CM

## 2019-06-23 DIAGNOSIS — I6523 Occlusion and stenosis of bilateral carotid arteries: Secondary | ICD-10-CM | POA: Diagnosis not present

## 2019-06-23 NOTE — Progress Notes (Signed)
Patient is a 73 year old male who returns for follow-up today.  He has previously had bilateral common iliac stent a left common femoral endarterectomy and bilateral carotid endarterectomies.  He continues to experience very short distance claudication in both lower extremities.  He is able to walk about a half a block.  Left leg is worse than the right.  He has no ulcerations on his feet.  He has developed a callus over the left fifth metatarsal region.  He has no rest pain.  He also has underlying renal dysfunction with serum creatinine ranging 2-2.5.  He has a history of coronary artery disease and prior coronary artery bypass grafting.  He is on a statin aspirin and Plavix.  He has no history of TIA amaurosis or stroke.  Review of systems: He has occasional intermittent lower extremity swelling.  He is on Lasix for this.  He has some shortness of breath with exertion.  Current Outpatient Medications on File Prior to Visit  Medication Sig Dispense Refill  . acetaminophen (TYLENOL) 325 MG tablet Take 650 mg by mouth daily as needed for mild pain or headache.     Marland Kitchen amLODipine (NORVASC) 10 MG tablet Take 1 tablet by mouth daily.    Marland Kitchen aspirin EC 81 MG tablet Take 1 tablet (81 mg total) by mouth every other day. (Patient taking differently: Take 81 mg by mouth See admin instructions. Take 81mg  once daily on Tues, Thurs, Sat, and Sun)    . carvedilol (COREG) 12.5 MG tablet TAKE 1 TABLET BY MOUTH TWICE DAILY. 180 tablet 0  . clopidogrel (PLAVIX) 75 MG tablet TAKE 1 TABLET DAILY WITH BREAKFAST. 90 tablet 3  . Continuous Blood Gluc Sensor (FREESTYLE LIBRE 14 DAY SENSOR) MISC MONITOR BS CONTINUOUSLY  CHANGE Q 14 DAYS.    Marland Kitchen docusate sodium (STOOL SOFTENER) 100 MG capsule Take 100 mg by mouth daily as needed for mild constipation.     . Evolocumab (REPATHA SURECLICK) 109 MG/ML SOAJ Inject 140 mg into the skin every 14 (fourteen) days. 2 pen 12  . fenofibrate 160 MG tablet Take 1 tablet (160 mg total) by mouth  daily. 90 tablet 2  . furosemide (LASIX) 80 MG tablet     . HUMALOG KWIKPEN 200 UNIT/ML SOPN Inject 20-28 Units into the skin See admin instructions. Use 20 units (base) per meal increase units given by 1 unit for every 15 increment over 150 (I.E. Blood glucose reading 165=21 units, 180=22 units, etc. )  5  . hydrALAZINE (APRESOLINE) 100 MG tablet TAKE 1 TABLET BY MOUTH 3 TIMES A DAY. 270 tablet 2  . isosorbide mononitrate (IMDUR) 60 MG 24 hr tablet TAKE 1 TABLET ONCE DAILY. 135 tablet 1  . pantoprazole (PROTONIX) 40 MG tablet Take 1 tablet (40 mg total) by mouth daily. 90 tablet 1  . rosuvastatin (CRESTOR) 10 MG tablet TAKE 1 TABLET ON TUESDAY, THURSDAY, SATURDAY AND SUNDAY. 51 tablet 5  . TOUJEO MAX SOLOSTAR 300 UNIT/ML SOPN Inject 68 Units into the skin 2 (two) times daily.   1  . VASCEPA 1 G CAPS Take 2 g by mouth 2 (two) times daily.   4  . isosorbide mononitrate (IMDUR) 30 MG 24 hr tablet Take 1 tablet (30 mg total) by mouth every evening. This is in addition to 60 mg in the morning. 90 tablet 3   No current facility-administered medications on file prior to visit.    Past Medical History:  Diagnosis Date  . Anemia   . Aortic  sclerosis  - without Stenosis  March 2014   Echo: Aortic sclerosis without stenosis. EF 55-60% with normal WM; mild concentric hypertrophy with normal relaxation.  . Arthritis   . CAD (coronary artery disease), autologous vein bypass graft 09/2014   10/03/14: Cath for Class III-IV Angina -- 80% ostial-proximal SVG-RPDA (minimal flow down native PDA from native RCA with 50% proximal and distal, patent LIMA-LAD, SVG-OM with retrograde filling of OM 2. --> 10/12/14: Staged PCI of SVG-RPDA - Xience DES  3.0 mm x 38 mm; (3.4  - 3.3 mm)  . CAD S/P percutaneous coronary angioplasty 06/12/2011; 09/2104   a) 2012: Complex PCI mRCA ([place]) --  2 overlapping Promus element DES stents.; 09/2014:  ost-prox SVG-rPDA - Xience DES 3.0 mm x 38 mm; (3.4  - 3.3 mm)  . CHF (congestive  heart failure) ([place])   . Childhood asthma   . CKD (chronic kidney disease), stage III    "mild; related to diabetes" (10/02/2014)  . Complication of anesthesia    "I've had name recall recognition problems since my last anesthesia"  . DM (diabetes mellitus) type II uncontrolled with eye manifestation ([place]) 2013   Diabetic retinopathy with retinal hemorrhages since;, recurrent in August 2014 treated with Avastin shots  . Dyspnea    mainly with exertion  . GERD (gastroesophageal reflux disease)   . Heart murmur    questionable  . High cholesterol   . Hypertension   . Migraine ~ 2004   "once"  . PAD (peripheral artery disease) ([facility])    with claudication  . PONV (postoperative nausea and vomiting)    s/p tonsillectomy  . 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 07/01/2013 (Cath for Crescendo Unstable Angina) --> [name]: For LM disease; LIMA-LAD, SVG-RCA, SCG-Cx; b. 09/2014 Cath/Staged PCI: patent LIMA->LAD, VG->OM1/RI, VG->RCA 80% (3.0x38 Xience Alpine DES on 10/12/2014).  . 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  Likely thromboembolic event with PDA occlusion. 06/12/2011   Post Complex PCI, pt states no mi  . Wears glasses    reading     Physical exam:  Vitals:   06/23/19 1336  BP: (!) 126/57  Pulse: (!) 58  Resp: 20  Temp: 98 F (36.7 C)  SpO2: 100%  Weight: 211 lb (95.7 kg)  Height: 5\' 8"  (1.727 m)    Lower extremities: 2+ femoral pulses absent popliteal and pedal pulses bilaterally  Skin: Dry scaly skin bilateral feet small callus left fifth metatarsal head no ulceration  Data: Patient had bilateral ABIs performed today right side was 0.86 slightly improved from 0.66 months ago.  Left side was 0.52 slightly increased from 0.46 months ago.  Assessment:  1.  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  Continue Plavix aspirin statin  2.  Peripheral arterial disease  patient with short distance claudication currently not interested in any further interventions.  He was counseled today and protecting his feet and I discussed with him softening up the callus on his left foot.  We discussed the possibility of a podiatry visit but he was a little bit skeptical of this.  I certainly have concerns that if he gets any wounds on his left foot he would be at high risk.  Plan:   He will follow-up in 6 months time for repeat ABIs and bilateral carotid duplex scan.  He will call me sooner if he has any wounds develop on  his feet.

## 2019-07-01 ENCOUNTER — Other Ambulatory Visit (HOSPITAL_COMMUNITY): Payer: Self-pay | Admitting: Vascular Surgery

## 2019-07-01 DIAGNOSIS — E7849 Other hyperlipidemia: Secondary | ICD-10-CM | POA: Diagnosis not present

## 2019-07-01 DIAGNOSIS — N184 Chronic kidney disease, stage 4 (severe): Secondary | ICD-10-CM | POA: Diagnosis not present

## 2019-07-01 DIAGNOSIS — I6529 Occlusion and stenosis of unspecified carotid artery: Secondary | ICD-10-CM

## 2019-07-01 DIAGNOSIS — D500 Iron deficiency anemia secondary to blood loss (chronic): Secondary | ICD-10-CM | POA: Diagnosis not present

## 2019-07-01 DIAGNOSIS — I739 Peripheral vascular disease, unspecified: Secondary | ICD-10-CM

## 2019-07-01 DIAGNOSIS — E211 Secondary hyperparathyroidism, not elsewhere classified: Secondary | ICD-10-CM | POA: Diagnosis not present

## 2019-07-01 DIAGNOSIS — R946 Abnormal results of thyroid function studies: Secondary | ICD-10-CM | POA: Diagnosis not present

## 2019-07-01 DIAGNOSIS — E1142 Type 2 diabetes mellitus with diabetic polyneuropathy: Secondary | ICD-10-CM | POA: Diagnosis not present

## 2019-07-01 DIAGNOSIS — Z794 Long term (current) use of insulin: Secondary | ICD-10-CM | POA: Diagnosis not present

## 2019-07-01 DIAGNOSIS — E1139 Type 2 diabetes mellitus with other diabetic ophthalmic complication: Secondary | ICD-10-CM | POA: Diagnosis not present

## 2019-07-01 DIAGNOSIS — H3520 Other non-diabetic proliferative retinopathy, unspecified eye: Secondary | ICD-10-CM | POA: Diagnosis not present

## 2019-07-01 DIAGNOSIS — I5189 Other ill-defined heart diseases: Secondary | ICD-10-CM | POA: Diagnosis not present

## 2019-07-01 DIAGNOSIS — D631 Anemia in chronic kidney disease: Secondary | ICD-10-CM | POA: Diagnosis not present

## 2019-07-01 DIAGNOSIS — L84 Corns and callosities: Secondary | ICD-10-CM | POA: Diagnosis not present

## 2019-07-03 ENCOUNTER — Ambulatory Visit: Payer: Medicare Other | Attending: Internal Medicine

## 2019-07-03 DIAGNOSIS — Z23 Encounter for immunization: Secondary | ICD-10-CM | POA: Diagnosis not present

## 2019-07-03 NOTE — Progress Notes (Signed)
   Covid-19 Vaccination Clinic  Name:  Lance Collier    MRN: [id number] DOB: 1946/04/19  07/03/2019  Lance Collier was observed post Covid-19 immunization for 30 minutes based on pre-vaccination screening without incidence. He was provided with Vaccine Information Sheet and instruction to access the V-Safe system.   Lance Collier was instructed to call 911 with any severe reactions post vaccine: Marland Kitchen Difficulty breathing  . Swelling of your face and throat  . A fast heartbeat  . A bad rash all over your body  . Dizziness and weakness    Immunizations Administered    Name Date Dose VIS Date Route   Pfizer COVID-19 Vaccine 07/03/2019 11:35 AM 0.3 mL 06/03/2019 Intramuscular   Manufacturer: [employer]   Lot: [id number]   [place]: [address]-4

## 2019-07-04 ENCOUNTER — Other Ambulatory Visit: Payer: Self-pay | Admitting: Cardiology

## 2019-07-18 ENCOUNTER — Other Ambulatory Visit: Payer: Self-pay | Admitting: Cardiology

## 2019-07-21 ENCOUNTER — Telehealth: Payer: Self-pay | Admitting: Cardiology

## 2019-07-21 ENCOUNTER — Encounter: Payer: Self-pay | Admitting: Cardiology

## 2019-07-21 ENCOUNTER — Ambulatory Visit (INDEPENDENT_AMBULATORY_CARE_PROVIDER_SITE_OTHER): Payer: Medicare Other | Admitting: Cardiology

## 2019-07-21 ENCOUNTER — Other Ambulatory Visit: Payer: Self-pay

## 2019-07-21 VITALS — BP 163/57 | HR 60 | Ht 68.0 in | Wt 228.2 lb

## 2019-07-21 DIAGNOSIS — I2510 Atherosclerotic heart disease of native coronary artery without angina pectoris: Secondary | ICD-10-CM

## 2019-07-21 DIAGNOSIS — I25709 Atherosclerosis of coronary artery bypass graft(s), unspecified, with unspecified angina pectoris: Secondary | ICD-10-CM | POA: Diagnosis not present

## 2019-07-21 DIAGNOSIS — I10 Essential (primary) hypertension: Secondary | ICD-10-CM

## 2019-07-21 DIAGNOSIS — E785 Hyperlipidemia, unspecified: Secondary | ICD-10-CM | POA: Diagnosis not present

## 2019-07-21 DIAGNOSIS — R600 Localized edema: Secondary | ICD-10-CM | POA: Diagnosis not present

## 2019-07-21 DIAGNOSIS — R0600 Dyspnea, unspecified: Secondary | ICD-10-CM

## 2019-07-21 DIAGNOSIS — R0609 Other forms of dyspnea: Secondary | ICD-10-CM

## 2019-07-21 DIAGNOSIS — I5033 Acute on chronic diastolic (congestive) heart failure: Secondary | ICD-10-CM

## 2019-07-21 DIAGNOSIS — Z9861 Coronary angioplasty status: Secondary | ICD-10-CM

## 2019-07-21 DIAGNOSIS — N1832 Chronic kidney disease, stage 3b: Secondary | ICD-10-CM | POA: Diagnosis not present

## 2019-07-21 MED ORDER — METOLAZONE 5 MG PO TABS: ORAL_TABLET | ORAL | 3 refills | Status: DC

## 2019-07-21 NOTE — Patient Instructions (Addendum)
Medication Instructions:     For tonight only - take an extra Isosorbide dose  Take the Zaroxolyn  4 days in a row then stop for 2 days then 4 days  Alternate- Start taking Zaroxolyn ( metolazone ) 5 mg ( 30 min prior) to the morning dose of lasix ( furosemide) .  for the first 2 days take  3 tablets of lasix in the mornig then 2 tablets in the evening  *If you need a refill on your cardiac medications before your next appointment, please call your pharmacy*  Lab Work:  BMP BNP today   If you have labs (blood work) drawn today and your tests are completely normal, you will receive your results only by: Marland Kitchen MyChart Message (if you have MyChart) OR . A paper copy in the mail If you have any lab test that is abnormal or we need to change your treatment, we will call you to review the results.  Testing/Procedures:  Will be schedule at [facility] has requested that you have an echocardiogram. Echocardiography is a painless test that uses sound waves to create images of your heart. It provides your doctor with information about the size and shape of your heart and how well your heart's chambers and valves are working. This procedure takes approximately one hour. There are no restrictions for this procedure.   Please Go to [facility] -tomorrow A chest x-ray takes a picture of the organs and structures inside the chest, including the heart, lungs, and blood vessels. This test can show several things, including, whether the heart is enlarges; whether fluid is building up in the lungs.  Follow-Up: At [facility], you and your health needs are our priority.  As part of our continuing mission to provide you with exceptional heart care, we have created designated Provider Care Teams.  These Care Teams include your primary Cardiologist (physician) and Advanced Practice Providers (APPs -  Physician Assistants and Nurse Practitioners) who all work together to  provide you with the care you need, when you need it.  Your next appointment:   3 week(s)  The format for your next appointment:   In Person  Provider:   Glenetta Hew, MD  Other Instructions

## 2019-07-21 NOTE — Telephone Encounter (Signed)
Pt c/o Shortness Of Breath: STAT if SOB developed within the last 24 hours or pt is noticeably SOB on the phone ° °1. Are you currently SOB (can you hear that pt is SOB on the phone)? yes °2. How long have you been experiencing SOB?  °3. Are you SOB when sitting or when up moving around?  °4.  Are you currently experiencing any other symptoms? ° °

## 2019-07-21 NOTE — Progress Notes (Signed)
Primary Care Provider: Reynold Bowen, MD Cardiologist: Glenetta Hew, MD Electrophysiologist: None  Clinic Note: Chief Complaint  Patient presents with  . Shortness of Breath    Lying down, walking.  Worsening swelling  . Congestive Heart Failure    Chronic diastolic  . Coronary Artery Disease    No angina    HPI:    Lance Collier is a 74 y.o. male with a cardiac history noted below who presents today for work in visit for worsening shortness of breath.   CAD:   ? December 2012: 2 overlapping DES to RCA;  ? January 2015: CABG [facility], SVG-Cx);  ? April 2016 -DES PCI to SVG-RCA.  PAD -most recently left femoral endarterectomy with bilateral common iliac stenting and profundoplasty September 2019 -> unfortunately not much notable improvement in symptoms  Carotid Artery Disease - Left Carotid Endarterectomy November 8413  Chronic Diastolic Heart Failure  CKD 3-4  Hale W Collier was last seen on March 07, 2019  Recent Hospitalizations: None  Reviewed  CV studies:    The following studies were reviewed today: (if available, images/films reviewed: From Epic Chart or Care Everywhere) . None:   Interval History:   Lance Collier is here today indicating that he has been much more short of breath of late.  At least 2 days ago he was extreme profoundly short of breath unable to lie down.  Gasping for breath.  Edema has gotten worse and his abdomen is more swollen.  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  Once that is done, he is fully retired.  He denies any chest pain or pressure, but has profound exertional dyspnea out of proportion to previous visits.  Feels easily fatigued early during the day.  CV Review of Symptoms (Summary): positive for - dyspnea on exertion, edema, irregular heartbeat, orthopnea, paroxysmal nocturnal dyspnea,  shortness of breath and Significant fatigue; claudication negative for - chest pain, palpitations, rapid heart rate or Syncope/near syncope, TIA/amaurosis fugax,   The patient does not have symptoms concerning for COVID-19 infection (fever, chills, cough, or new shortness of breath).  The patient is practicing social distancing & Masking.    REVIEWED OF SYSTEMS   A comprehensive ROS was performed. Review of Systems  Constitutional: Positive for malaise/fatigue (Extremely fatigued, tired and worn out.). Negative for weight loss.  HENT: Negative for congestion and nosebleeds.   Respiratory: Positive for shortness of breath. Negative for cough and wheezing.   Cardiovascular: Positive for orthopnea, claudication (Still limits his walking.), leg swelling and PND. Negative for palpitations.  Gastrointestinal: Negative for blood in stool, constipation and melena.  Genitourinary: Positive for frequency.       Frequent nocturia  Musculoskeletal: Positive for joint pain. Negative for falls.  Neurological: Positive for dizziness and tingling.  Psychiatric/Behavioral: Negative for memory loss. The patient is not nervous/anxious and does not have insomnia.    I have reviewed and (if needed) personally updated the patient's problem list, medications, allergies, past medical and surgical history, social and family history.   PAST MEDICAL HISTORY   Past Medical History:  Diagnosis Date  . Anemia   . Aortic sclerosis  - without Stenosis  March 2014   Echo: Aortic sclerosis without stenosis. EF 55-60% with normal WM; mild concentric hypertrophy with normal relaxation.  . Arthritis   . CAD (coronary artery disease), autologous  vein bypass graft 09/2014   10/03/14: Cath for Class III-IV Angina -- 80% ostial-proximal SVG-RPDA (minimal flow down native PDA from native RCA with 50% proximal and distal, patent LIMA-LAD, SVG-OM with retrograde filling of OM 2. --> 10/12/14: Staged PCI of SVG-RPDA - Xience DES   3.0 mm x 38 mm; (3.4  - 3.3 mm)  . CAD S/P percutaneous coronary angioplasty 06/12/2011; 09/2104   a) 2012: Complex PCI mRCA ([place]) --  2 overlapping Promus element DES stents.; 09/2014:  ost-prox SVG-rPDA - Xience DES 3.0 mm x 38 mm; (3.4  - 3.3 mm)  . CHF (congestive heart failure) ([place])   . Childhood asthma   . CKD (chronic kidney disease), stage III    "mild; related to diabetes" (10/02/2014)  . Complication of anesthesia    "I've had name recall recognition problems since my last anesthesia"  . DM (diabetes mellitus) type II uncontrolled with eye manifestation ([place]) 2013   Diabetic retinopathy with retinal hemorrhages since;, recurrent in August 2014 treated with Avastin shots  . Dyspnea    mainly with exertion  . GERD (gastroesophageal reflux disease)   . Heart murmur    questionable  . High cholesterol   . Hypertension   . Migraine ~ 2004   "once"  . PAD (peripheral artery disease) (HCC)    with claudication  . PONV (postoperative nausea and vomiting)    s/p tonsillectomy  . 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 07/01/2013 (Cath for Crescendo Unstable Angina) --> [name]: For LM disease; LIMA-LAD, SVG-RCA, SCG-Cx; b. 09/2014 Cath/Staged PCI: patent LIMA->LAD, VG->OM1/RI, VG->RCA 80% (3.0x38 Xience Alpine DES on 10/12/2014).  . 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  Likely thromboembolic event with PDA occlusion. 06/12/2011   Post Complex PCI, pt states no mi  . Wears glasses    reading    PAST SURGICAL HISTORY   Past Surgical History:  Procedure Laterality Date  . ABDOMINAL AORTOGRAM W/LOWER EXTREMITY N/A 02/05/2018   Procedure: ABDOMINAL AORTOGRAM W/LOWER EXTREMITY;  Surgeon: Elam Dutch, MD;  Location: [place] CV LAB;  Service: Cardiovascular;  Laterality: N/A;  . COLONOSCOPY    . CORONARY ARTERY BYPASS GRAFT N/A 07/01/2013   Procedure: CORONARY ARTERY BYPASS GRAFTING (CABG);  Surgeon: Grace Isaac, MD;   Location: [place];  Service: Open Heart Surgery;  Laterality: N/A;  Times 3 using left internal mammary artery and endoscopically harvested bilateral saphenous vein  . ENDARTERECTOMY Left 05/21/2016   Procedure: LEFT CAROTID ENDARTERECTOMY;  Surgeon: Elam Dutch, MD;  Location: [place];  Service: Vascular;  Laterality: Left;  . ENDARTERECTOMY FEMORAL Left 02/23/2018   Procedure: LEFT COMMON FEMORAL ENDARTERECTOMY;  Surgeon: Elam Dutch, MD;  Location: [place];  Service: Vascular;  Laterality: Left;  . EYE SURGERY Bilateral    ioc for cataracts  . INSERTION OF ILIAC STENT Bilateral 02/23/2018   Procedure: INSERTION OF BILATERAL COMMON ILIAC STENTS WITH VIABAHN STENTS;  Surgeon: Elam Dutch, MD;  Location: [place];  Service: Vascular;  Laterality: Bilateral;  . INTRAOPERATIVE TRANSESOPHAGEAL ECHOCARDIOGRAM N/A 07/01/2013   Procedure: INTRAOPERATIVE TRANSESOPHAGEAL ECHOCARDIOGRAM;  Surgeon: Grace Isaac, MD;  Location: [place];  Service: Open Heart Surgery;  Laterality: N/A;  . LEFT AND RIGHT HEART CATHETERIZATION WITH CORONARY/GRAFT ANGIOGRAM N/A 10/03/2014   Procedure: LEFT AND RIGHT HEART CATHETERIZATION WITH Beatrix Fetters;  Surgeon: Leonie Man, MD;  Location: [facility] CATH LAB;  Service: Cardiovascular; severe prox RCA 80% (  dRCA w/ competetive flow & 60% hazy ISR in Native RCA), patent LIMA-LAD  & SVG-OM1-RI - retrograde fills OM2, known native LM & mod OM2.  Severe Systemic HTN - LVEDP/PCWP 22 & 28 mmHg, Normal CO/CI  . LEFT HEART CATHETERIZATION WITH CORONARY ANGIOGRAM N/A 06/11/2011   Procedure: LEFT HEART CATHETERIZATION WITH CORONARY ANGIOGRAM;  Surgeon: Fulton Reek, MD;  Location: [place] CATH LAB;;  Dense LM & prox LAD, prox RCA calcification. Large bifurcating OM1 & small OM2 normal. Minor LAD & D1 dz, tortuous prox-mid RCA w/ 60% &  subtotal occluded dRCA w/ normal PDA & PLV   . LEFT HEART CATHETERIZATION WITH CORONARY ANGIOGRAM N/A 06/28/2013   Procedure: LEFT HEART  CATHETERIZATION WITH CORONARY ANGIOGRAM;  Surgeon: Leonie Man, MD;  Location: [facility] CATH LAB;  Service: Cardiovascular;  ostial LM disese, RCA ~40-50%ISR  . NM MYOVIEW LTD  04/29/2018   LEXISCAN: Non-gated study with small fixed moderate intensity defect in the apical inferior wall suspicious for either small area of infarction versus attenuation (no change from previous study).  LOW RISK.   Marland Kitchen PATCH ANGIOPLASTY Left 05/21/2016   Procedure: PATCH ANGIOPLASTY USING HEMASHIELD PLATINUM FINESSE 0.3in x 3 in;  Surgeon: Elam Dutch, MD;  Location: [place];  Service: Vascular;  Laterality: Left;  . PATCH ANGIOPLASTY Left 02/23/2018   Procedure: PATCH ANGIOPLASTY LEFT COMMON FEMORAL ARTERY;  Surgeon: Elam Dutch, MD;  Location: [place];  Service: Vascular;  Laterality: Left;  . PERCUTANEOUS CORONARY STENT INTERVENTION (PCI-S)  06/11/2011   Procedure: PERCUTANEOUS CORONARY STENT INTERVENTION (PCI-S);  Surgeon: Fulton Reek, MD;  Location: [facility] CATH LAB;  Service: Cardiovascular;;2 overlapping Promus DES 2.64mm at 12 mm x2; postdilated to 3 mm  . PERCUTANEOUS CORONARY STENT INTERVENTION (PCI-S) N/A 10/12/2014   Procedure: PERCUTANEOUS CORONARY STENT INTERVENTION (PCI-S);  Surgeon: Leonie Man, MD;  Location: [facility] CATH LAB;  Ostial-Prox SVG-RCA Xience Alpine DES 3.0 mm x 38 mm; (3.4  - 3.3 mm)  . RETINAL LASER PROCEDURE Bilateral    "more than once"   . TONSILLECTOMY  as child  . TRANSTHORACIC ECHOCARDIOGRAM  03/2017    EF 55-60%.  Normal wall thickness.  No regional wall motion normalities.  GR 2 DD.  Mild-moderate calcified aortic valve.  Mild RV systolic dysfunction.  Moderate RV dilation.  Moderately increased PA pressures.    MEDICATIONS/ALLERGIES   Current Meds  Medication Sig  . acetaminophen (TYLENOL) 325 MG tablet Take 650 mg by mouth daily as needed for mild pain or headache.   Marland Kitchen amLODipine (NORVASC) 10 MG tablet Take 1 tablet by mouth daily.  Marland Kitchen aspirin EC 81 MG tablet Take 1 tablet  (81 mg total) by mouth every other day. (Patient taking differently: Take 81 mg by mouth See admin instructions. Take 81mg  once daily on Tues, Thurs, Sat, and Sun)  . carvedilol (COREG) 12.5 MG tablet TAKE 1 TABLET BY MOUTH TWICE DAILY.  Marland Kitchen clopidogrel (PLAVIX) 75 MG tablet TAKE 1 TABLET DAILY WITH BREAKFAST.  Marland Kitchen Continuous Blood Gluc Sensor (FREESTYLE LIBRE 14 DAY SENSOR) MISC MONITOR BS CONTINUOUSLY  CHANGE Q 14 DAYS.  Marland Kitchen docusate sodium (STOOL SOFTENER) 100 MG capsule Take 100 mg by mouth daily as needed for mild constipation.   . Evolocumab (REPATHA SURECLICK) 355 MG/ML SOAJ Inject 140 mg into the skin every 14 (fourteen) days.  . fenofibrate 160 MG tablet Take 1 tablet (160 mg total) by mouth daily.  . furosemide (LASIX) 80 MG tablet   . HUMALOG [facility]  200 UNIT/ML SOPN Inject 20-28 Units into the skin See admin instructions. Use 20 units (base) per meal increase units given by 1 unit for every 15 increment over 150 (I.E. Blood glucose reading 165=21 units, 180=22 units, etc. )  . hydrALAZINE (APRESOLINE) 100 MG tablet TAKE 1 TABLET BY MOUTH 3 TIMES A DAY.  . isosorbide mononitrate (IMDUR) 60 MG 24 hr tablet TAKE 1 TABLET ONCE DAILY.  . pantoprazole (PROTONIX) 40 MG tablet TAKE 1 TABLET ONCE DAILY.  . rosuvastatin (CRESTOR) 10 MG tablet TAKE 1 TABLET ON TUESDAY, THURSDAY, SATURDAY AND SUNDAY.  Marland Kitchen TOUJEO MAX SOLOSTAR 300 UNIT/ML SOPN Inject 68 Units into the skin 2 (two) times daily.   Marland Kitchen VASCEPA 1 G CAPS Take 2 g by mouth 2 (two) times daily.     Allergies  Allergen Reactions  . Atorvastatin Other (See Comments)    Leg pain  . Ibuprofen Hives  . Pravastatin Other (See Comments)    Leg pain  . Simvastatin Other (See Comments)    Leg pain    SOCIAL HISTORY/FAMILY HISTORY   Social History   Tobacco Use  . Smoking status: Former Smoker    Packs/day: 2.00    Years: 27.00    Pack years: 54.00    Types: Cigarettes    Quit date: 11/23/1996    Years since quitting: 22.6  . Smokeless  tobacco: Never Used  Substance Use Topics  . Alcohol use: Yes    Comment: rarely  . 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  He has 1 account that he still trying to complete.     He has social alcoholic beverages but is trying to cut that back and does not smoke.       He tells me he is a sucker for chips but does not eat sweets because of diabetes.    Family History family history includes Breast cancer in his mother and sister; Colon polyps in his father; Hyperlipidemia in his father; Hypertension in his father; Liver cancer in his maternal grandmother; Lung cancer in his sister; Throat cancer in his father.  OBJCTIVE -PE, EKG, labs   Wt Readings from Last 3 Encounters:  07/21/19 228 lb 3.2 oz (103.5 kg)  06/23/19 211 lb (95.7 kg)  03/07/19 214 lb 3.2 oz (97.2 kg)    Physical Exam: BP (!) 163/57   Pulse 60   Ht 5\' 8"  (1.727 m)   Wt 228 lb 3.2 oz (103.5 kg)   SpO2 94%   BMI 34.70 kg/m  Physical Exam  Constitutional: He is oriented to person, place, and time. No distress.  Tired, chronically ill-appearing gentleman.  Nontoxic.  Looks older than stated age  HENT:  Head: Normocephalic and atraumatic.  Neck: Hepatojugular reflux and JVD (8-9 cmH2O) present. Normal carotid pulses present. Carotid bruit is present (Bilateral).  Cardiovascular: Normal rate, regular rhythm and S1 normal.  Occasional extrasystoles are present. PMI is not displaced. Exam reveals gallop, S4, distant heart sounds and decreased pulses (Bilateral pedal pulses very weak and faint.). Exam reveals no friction rub.  Murmur heard.  Harsh crescendo-decrescendo midsystolic murmur is present with a grade of 2/6 at the upper right sternal border radiating to the neck. High-pitched blowing holosystolic murmur of grade 1/6 is also present at the lower left sternal border and apex. Split S2  Pulmonary/Chest: Effort normal. No  respiratory distress. He has no wheezes. He  exhibits no tenderness.  Diminished bibasal breath sounds but no obvious rales.  Abdominal: Soft. Bowel sounds are normal. There is no abdominal tenderness. There is no rebound.  Somewhat distended firm abdomen.  More protuberant than usual, cannot exclude ascites.  Musculoskeletal:        General: Edema (2-3+ bilateral edema from knee down) present. Normal range of motion.     Cervical back: Normal range of motion and neck supple.  Neurological: He is alert and oriented to person, place, and time.  Skin: He is not diaphoretic.  Psychiatric: He has a normal mood and affect. His behavior is normal. Judgment and thought content normal.  Vitals reviewed.   Adult ECG Report  Rate: 60 ;  Rhythm: normal sinus rhythm and 1 degree AVB.  Left axis deviation.  LVH with repolarization changes.;   Narrative Interpretation: Stable  Recent Labs: 07/01/2019: TC 228, TG 396, HDL 23, LDL 126.  Cr 3.0.  Hgb 10.9. Lab Results  Component Value Date   CHOL 150 12/02/2018   HDL 23 (L) 12/02/2018   LDLCALC 59 12/02/2018   TRIG 341 (H) 12/02/2018   CHOLHDL 6.5 (H) 12/02/2018   Lab Results  Component Value Date   CREATININE 2.94 (H) 07/21/2019   BUN 56 (H) 07/21/2019   NA 140 07/21/2019   K 4.5 07/21/2019   CL 100 07/21/2019   CO2 26 07/21/2019   Lab Results  Component Value Date   TSH 4.660 (H) 04/13/2018  BNP 450  ASSESSMENT/PLAN    Problem List Items Addressed This Visit    DOE (dyspnea on exertion) (Chronic)   Relevant Orders   Basic metabolic panel (Completed)   Brain natriuretic peptide (Completed)   ECHOCARDIOGRAM COMPLETE   Coronary artery disease involving coronary bypass graft with angina pectoris (HCC) (Chronic)    Now almost 5 years out from his most recent PCI and 6 years from CABG. He has significant CAD and PAD but no active angina.  Most recent Myoview in November 2019 was nonischemic, read as low risk.  Remains on aspirin and  Plavix along with carvedilol and amlodipine.  Not on ARB/ACE ACE inhibitor because of renal insufficiency.  His old hydralazine/Imdur. Also on Crestor 4 days a week and now started on Repatha.      Relevant Medications   metolazone (ZAROXOLYN) 5 MG tablet   Other Relevant Orders   EKG 12-Lead (Completed)   Basic metabolic panel (Completed)   Brain natriuretic peptide (Completed)   CAD S/P PCI SVG-RCA Xience Alpine DES 3.0 mm x 38 mm (ostial-prox) - 3.100mm (Chronic)    Initially PCI to the RCA prior to CABG and then follow-up CABG now with stents in SVG-RCA. Is on aspirin and Plavix secondary to CAD, PAD and carotid disease.  Is now on amlodipine, carvedilol and 90 mg of Imdur for antianginal effect.  --> Okay to hold aspirin/Plavix for procedures 5 to 7 days preop.      Relevant Medications   metolazone (ZAROXOLYN) 5 MG tablet   Other Relevant Orders   EKG 12-Lead (Completed)   Basic metabolic panel (Completed)   Brain natriuretic peptide (Completed)   DG Chest 2 View (Completed)   ECHOCARDIOGRAM COMPLETE   Essential hypertension (Chronic)    Blood pressures pretty high today intends to be part of the issue.  He is already on hydralazine and milligrams 3 times daily along with 90 of Imdur, 12.5 mg twice daily carvedilol and 10 of amlodipine.  This is in addition to significant  diuretic dosing.  With resting heart rate of 60 bpm, no have a lot of room to increase carvedilol.  For now we will simply increase his diuretic because of what appears to be acute on chronic diastolic heart failure.      Relevant Medications   metolazone (ZAROXOLYN) 5 MG tablet   Hyperlipidemia with target LDL less than 70; intolerance to atorvastatin and simvastatin (Chronic)    Most recent labs from June 2020 look great on Repatha, rosuvastatin, fibrate and Vascepa.  Triglycerides remain elevated despite Vascepa and fibrate.      Relevant Medications   metolazone (ZAROXOLYN) 5 MG tablet   CKD  (chronic kidney disease), stage III (Chronic)    Most recent creatinine was 3, moving more towards stage IV and potentially stage V CKD.  As long as making urine we will continue to use diuretic, but I suspect that he could potentially be moving toward dialysis.  Volume control will be very difficult.  Need to monitor closely with increased diuretic.  We did check baseline creatinine and BNP today.      Relevant Orders   Basic metabolic panel (Completed)   Brain natriuretic peptide (Completed)   Acute on chronic diastolic heart failure (HCC) - Primary    Baseline class II symptoms of CHF now class III, edema is worse, I think a lot of his weight is also in his abdomen.  He has JVD.  Currently taking 160 mg twice daily Lasix per nephrology.  We will check a BNP and echocardiogram as well as chest x-ray, but will go ahead and start Zaroxolyn.  He takes 5 mg Zaroxolyn for 4 days in a row (with morning dose of Lasix) then stop for 2 days and alternate 4 days on 2 days off until weight is back down to 211 pounds.      Relevant Medications   metolazone (ZAROXOLYN) 5 MG tablet   Other Relevant Orders   Basic metabolic panel (Completed)   Brain natriuretic peptide (Completed)   DG Chest 2 View (Completed)   ECHOCARDIOGRAM COMPLETE      COVID-19 Education: The signs and symptoms of COVID-19 were discussed with the patient and how to seek care for testing (follow up with PCP or arrange E-visit).   The importance of social distancing was discussed today.  I spent a total of 74minutes with the patient. >  50% of the time was spent in direct patient consultation.  Additional time spent with chart review  / charting (studies, outside notes, etc): 8 Total Time: 32 min   Current medicines are reviewed at length with the patient today.  (+/- concerns) n/a   Patient Instructions / Medication Changes & Studies & Tests Ordered   Patient Instructions  Medication Instructions:     For tonight  only - take an extra Isosorbide dose  Take the Zaroxolyn  4 days in a row then stop for 2 days then 4 days  Alternate- Start taking Zaroxolyn ( metolazone ) 5 mg ( 30 min prior) to the morning dose of lasix ( furosemide) .  for the first 2 days take  3 tablets of lasix in the mornig then 2 tablets in the evening  *If you need a refill on your cardiac medications before your next appointment, please call your pharmacy*  Lab Work:  BMP BNP today   If you have labs (blood work) drawn today and your tests are completely normal, you will receive your results only by: Marland Kitchen MyChart Message (if  you have MyChart) OR . A paper copy in the mail If you have any lab test that is abnormal or we need to change your treatment, we will call you to review the results.  Testing/Procedures:  Will be schedule at [place] has requested that you have an echocardiogram. Echocardiography is a painless test that uses sound waves to create images of your heart. It provides your doctor with information about the size and shape of your heart and how well your heart's chambers and valves are working. This procedure takes approximately one hour. There are no restrictions for this procedure.   Please Go to [place] -tomorrow A chest x-ray takes a picture of the organs and structures inside the chest, including the heart, lungs, and blood vessels. This test can show several things, including, whether the heart is enlarges; whether fluid is building up in the lungs.  Follow-Up: At [facility], you and your health needs are our priority.  As part of our continuing mission to provide you with exceptional heart care, we have created designated Provider Care Teams.  These Care Teams include your primary Cardiologist (physician) and Advanced Practice Providers (APPs -  Physician Assistants and Nurse Practitioners) who all work together to provide you with the care you need, when  you need it.  Your next appointment:   3 week(s)  The format for your next appointment:   In Person  Provider:   Glenetta Hew, MD  Other Instructions   Studies Ordered:   Orders Placed This Encounter  Procedures  . DG Chest 2 View  . Basic metabolic panel  . Brain natriuretic peptide  . EKG 12-Lead  . ECHOCARDIOGRAM COMPLETE     Glenetta Hew, M.D., M.S. Interventional Cardiologist   Pager # [phone] Phone # [phone] [address] [place], [place] [address]   Thank you for choosing Heartcare at [facility]!!

## 2019-07-21 NOTE — Telephone Encounter (Signed)
Spoke with pt and feels fine "gasping for air " Pt was awakened during the night gasping for air, notes mild cough, and a little more swelling than usual After discussing diet Pt did say he had [place] food last night Appt made with Dr Ellyn Hack for 2:00 today .

## 2019-07-22 ENCOUNTER — Ambulatory Visit: Payer: Medicare Other

## 2019-07-22 ENCOUNTER — Ambulatory Visit
Admission: RE | Admit: 2019-07-22 | Discharge: 2019-07-22 | Disposition: A | Discharge status: Home / Self Care | Payer: Medicare Other | Source: Ambulatory Visit | Attending: Cardiology | Admitting: Cardiology

## 2019-07-22 DIAGNOSIS — I509 Heart failure, unspecified: Secondary | ICD-10-CM | POA: Diagnosis not present

## 2019-07-22 DIAGNOSIS — I2510 Atherosclerotic heart disease of native coronary artery without angina pectoris: Secondary | ICD-10-CM

## 2019-07-22 DIAGNOSIS — I5033 Acute on chronic diastolic (congestive) heart failure: Secondary | ICD-10-CM

## 2019-07-22 LAB — BASIC METABOLIC PANEL
BUN/Creatinine Ratio: 19 (ref 10–24)
BUN: 56 mg/dL — ABNORMAL HIGH (ref 8–27)
CO2: 26 mmol/L (ref 20–29)
Calcium: 8.8 mg/dL (ref 8.6–10.2)
Chloride: 100 mmol/L (ref 96–106)
Creatinine, Ser: 2.94 mg/dL — ABNORMAL HIGH (ref 0.76–1.27)
GFR calc Af Amer: 23 mL/min/{1.73_m2} — ABNORMAL LOW (ref >59)
GFR calc non Af Amer: 20 mL/min/{1.73_m2} — ABNORMAL LOW (ref >59)
Glucose: 144 mg/dL — ABNORMAL HIGH (ref 65–99)
Potassium: 4.5 mmol/L (ref 3.5–5.2)
Sodium: 140 mmol/L (ref 134–144)

## 2019-07-22 LAB — BRAIN NATRIURETIC PEPTIDE: BNP: 450.2 pg/mL — ABNORMAL HIGH (ref 0.0–100.0)

## 2019-07-23 ENCOUNTER — Ambulatory Visit: Payer: Medicare Other | Attending: Internal Medicine

## 2019-07-23 DIAGNOSIS — Z23 Encounter for immunization: Secondary | ICD-10-CM

## 2019-07-23 NOTE — Progress Notes (Signed)
   Covid-19 Vaccination Clinic  Name:  Lance Collier    MRN: [id number] DOB: 03-28-1946  07/23/2019  Lance Collier was observed post Covid-19 immunization for 30 minutes based on pre-vaccination screening without incidence. He was provided with Vaccine Information Sheet and instruction to access the V-Safe system.   Lance Collier was instructed to call 911 with any severe reactions post vaccine: Marland Kitchen Difficulty breathing  . Swelling of your face and throat  . A fast heartbeat  . A bad rash all over your body  . Dizziness and weakness    Immunizations Administered    Name Date Dose VIS Date Route   Pfizer COVID-19 Vaccine 07/23/2019  9:39 AM 0.3 mL 06/03/2019 Intramuscular   Manufacturer: [place]   Lot: [id number]   [place]: [id number]

## 2019-07-24 ENCOUNTER — Encounter: Payer: Self-pay | Admitting: Cardiology

## 2019-07-24 NOTE — Assessment & Plan Note (Signed)
Most recent labs from June 2020 look great on Repatha, rosuvastatin, fibrate and Vascepa.  Triglycerides remain elevated despite Vascepa and fibrate.

## 2019-07-24 NOTE — Assessment & Plan Note (Signed)
Baseline class II symptoms of CHF now class III, edema is worse, I think a lot of his weight is also in his abdomen.  He has JVD.  Currently taking 160 mg twice daily Lasix per nephrology.  We will check a BNP and echocardiogram as well as chest x-ray, but will go ahead and start Zaroxolyn.  He takes 5 mg Zaroxolyn for 4 days in a row (with morning dose of Lasix) then stop for 2 days and alternate 4 days on 2 days off until weight is back down to 211 pounds.

## 2019-07-24 NOTE — Assessment & Plan Note (Signed)
Blood pressures pretty high today intends to be part of the issue.  He is already on hydralazine and milligrams 3 times daily along with 90 of Imdur, 12.5 mg twice daily carvedilol and 10 of amlodipine.  This is in addition to significant diuretic dosing.  With resting heart rate of 60 bpm, no have a lot of room to increase carvedilol.  For now we will simply increase his diuretic because of what appears to be acute on chronic diastolic heart failure.

## 2019-07-24 NOTE — Assessment & Plan Note (Signed)
Initially PCI to the RCA prior to CABG and then follow-up CABG now with stents in SVG-RCA. Is on aspirin and Plavix secondary to CAD, PAD and carotid disease.  Is now on amlodipine, carvedilol and 90 mg of Imdur for antianginal effect.  --> Okay to hold aspirin/Plavix for procedures 5 to 7 days preop.

## 2019-07-24 NOTE — Assessment & Plan Note (Signed)
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  As long as making urine we will continue to use diuretic, but I suspect that he could potentially be moving toward dialysis.  Volume control will be very difficult.  Need to monitor closely with increased diuretic.  We did check baseline creatinine and BNP today.

## 2019-07-24 NOTE — Assessment & Plan Note (Signed)
Now almost 5 years out from his most recent PCI and 6 years from CABG. He has significant CAD and PAD but no active angina.  Most recent Myoview in November 2019 was nonischemic, read as low risk.  Remains on aspirin and Plavix along with carvedilol and amlodipine.  Not on ARB/ACE ACE inhibitor because of renal insufficiency.  His old hydralazine/Imdur. Also on Crestor 4 days a week and now started on Repatha.

## 2019-07-25 ENCOUNTER — Telehealth: Payer: Self-pay | Admitting: *Deleted

## 2019-07-25 ENCOUNTER — Other Ambulatory Visit: Payer: Self-pay | Admitting: *Deleted

## 2019-07-25 DIAGNOSIS — Z79899 Other long term (current) drug therapy: Secondary | ICD-10-CM

## 2019-07-25 DIAGNOSIS — I5033 Acute on chronic diastolic (congestive) heart failure: Secondary | ICD-10-CM

## 2019-07-25 DIAGNOSIS — N1832 Chronic kidney disease, stage 3b: Secondary | ICD-10-CM

## 2019-07-25 NOTE — Telephone Encounter (Signed)
Left message on patient's voicemail per dpr,   Result given and request labs to be redrawn in @ 1 week -- 2/7 or2/8 2021  any question may call back .

## 2019-07-25 NOTE — Telephone Encounter (Signed)
-----   Message from Leonie Man, MD sent at 07/23/2019  8:35 PM EST ----- BMP is only mild to moderately elevated.  Does suggest worsening heart failure.  Unfortunately, kidney function also looks pretty bad with creatinine of 2.9 and BUN of 56.  Hopefully we can improve this with diuresis.  Probably need to recheck in about a week,   Glenetta Hew, MD

## 2019-07-28 DIAGNOSIS — Z79899 Other long term (current) drug therapy: Secondary | ICD-10-CM | POA: Diagnosis not present

## 2019-07-28 DIAGNOSIS — I5033 Acute on chronic diastolic (congestive) heart failure: Secondary | ICD-10-CM | POA: Diagnosis not present

## 2019-07-28 DIAGNOSIS — N1832 Chronic kidney disease, stage 3b: Secondary | ICD-10-CM | POA: Diagnosis not present

## 2019-07-28 LAB — BASIC METABOLIC PANEL
BUN/Creatinine Ratio: 22 (ref 10–24)
BUN: 73 mg/dL — ABNORMAL HIGH (ref 8–27)
CO2: 25 mmol/L (ref 20–29)
Calcium: 9.1 mg/dL (ref 8.6–10.2)
Chloride: 91 mmol/L — ABNORMAL LOW (ref 96–106)
Creatinine, Ser: 3.32 mg/dL — ABNORMAL HIGH (ref 0.76–1.27)
GFR calc Af Amer: 20 mL/min/{1.73_m2} — ABNORMAL LOW (ref >59)
GFR calc non Af Amer: 17 mL/min/{1.73_m2} — ABNORMAL LOW (ref >59)
Glucose: 191 mg/dL — ABNORMAL HIGH (ref 65–99)
Potassium: 4.5 mmol/L (ref 3.5–5.2)
Sodium: 134 mmol/L (ref 134–144)

## 2019-07-29 ENCOUNTER — Telehealth: Payer: Self-pay | Admitting: *Deleted

## 2019-07-29 ENCOUNTER — Other Ambulatory Visit: Payer: Self-pay | Admitting: *Deleted

## 2019-07-29 DIAGNOSIS — R0600 Dyspnea, unspecified: Secondary | ICD-10-CM

## 2019-07-29 DIAGNOSIS — R0609 Other forms of dyspnea: Secondary | ICD-10-CM

## 2019-07-29 DIAGNOSIS — I5032 Chronic diastolic (congestive) heart failure: Secondary | ICD-10-CM

## 2019-07-29 DIAGNOSIS — N1832 Chronic kidney disease, stage 3b: Secondary | ICD-10-CM

## 2019-07-29 DIAGNOSIS — I5033 Acute on chronic diastolic (congestive) heart failure: Secondary | ICD-10-CM

## 2019-07-29 MED ORDER — FUROSEMIDE 80 MG PO TABS: 160.0000 mg | ORAL_TABLET | Freq: Two times a day (BID) | ORAL | Status: DC

## 2019-07-29 NOTE — Telephone Encounter (Signed)
-----   Message from Leonie Man, MD sent at 07/29/2019 12:58 PM EST ----- Unfortunately, it looks like his kidney function did not like the bump in the diuretic dosing.  Please have him hold a dose of Lasix for today (if he is already taking both today and then just hold tomorrow morning) and then go back to his home prior dosing starting Saturday  Probably need to recheck chemistry next week Next question is how is his breathing.  Glenetta Hew, MD

## 2019-07-29 NOTE — Telephone Encounter (Signed)
Patient reviewed instruction  Still had questions.  Patient states he has already taken both doses of Lasix today. He also states he feels a lot better.   RN contacted Dr Ellyn Hack For clarification  patient is to stop taking Metolazone  As prescribed for now.  do not take morning dose of lasix tomorrow  07/30/19 but  Afternoon dose.  starting Sunday  Take  160 mg lasix twice a day- morning and  Afternoon.  recheck lab on Thursday or Friday of this week feb 11 or 12.  Patient  States he understands he states he wrote it down.

## 2019-08-02 ENCOUNTER — Ambulatory Visit (HOSPITAL_COMMUNITY): Payer: Medicare Other | Attending: Cardiology

## 2019-08-02 ENCOUNTER — Other Ambulatory Visit: Payer: Self-pay

## 2019-08-02 DIAGNOSIS — I2510 Atherosclerotic heart disease of native coronary artery without angina pectoris: Secondary | ICD-10-CM | POA: Insufficient documentation

## 2019-08-02 DIAGNOSIS — R0600 Dyspnea, unspecified: Secondary | ICD-10-CM | POA: Diagnosis not present

## 2019-08-02 DIAGNOSIS — Z9861 Coronary angioplasty status: Secondary | ICD-10-CM | POA: Insufficient documentation

## 2019-08-02 DIAGNOSIS — I5033 Acute on chronic diastolic (congestive) heart failure: Secondary | ICD-10-CM | POA: Diagnosis not present

## 2019-08-02 DIAGNOSIS — R0609 Other forms of dyspnea: Secondary | ICD-10-CM

## 2019-08-10 ENCOUNTER — Encounter: Payer: Self-pay | Admitting: Cardiology

## 2019-08-10 ENCOUNTER — Ambulatory Visit (INDEPENDENT_AMBULATORY_CARE_PROVIDER_SITE_OTHER): Payer: Medicare Other | Admitting: Cardiology

## 2019-08-10 ENCOUNTER — Other Ambulatory Visit: Payer: Self-pay

## 2019-08-10 VITALS — BP 138/50 | HR 57 | Temp 97.7°F | Ht 68.0 in | Wt 221.0 lb

## 2019-08-10 DIAGNOSIS — I5033 Acute on chronic diastolic (congestive) heart failure: Secondary | ICD-10-CM | POA: Diagnosis not present

## 2019-08-10 DIAGNOSIS — I10 Essential (primary) hypertension: Secondary | ICD-10-CM | POA: Diagnosis not present

## 2019-08-10 DIAGNOSIS — R0600 Dyspnea, unspecified: Secondary | ICD-10-CM | POA: Diagnosis not present

## 2019-08-10 DIAGNOSIS — E669 Obesity, unspecified: Secondary | ICD-10-CM | POA: Diagnosis not present

## 2019-08-10 DIAGNOSIS — Z9861 Coronary angioplasty status: Secondary | ICD-10-CM | POA: Diagnosis not present

## 2019-08-10 DIAGNOSIS — N1832 Chronic kidney disease, stage 3b: Secondary | ICD-10-CM

## 2019-08-10 DIAGNOSIS — I25709 Atherosclerosis of coronary artery bypass graft(s), unspecified, with unspecified angina pectoris: Secondary | ICD-10-CM | POA: Diagnosis not present

## 2019-08-10 DIAGNOSIS — E785 Hyperlipidemia, unspecified: Secondary | ICD-10-CM | POA: Diagnosis not present

## 2019-08-10 DIAGNOSIS — I2510 Atherosclerotic heart disease of native coronary artery without angina pectoris: Secondary | ICD-10-CM | POA: Diagnosis not present

## 2019-08-10 NOTE — Progress Notes (Signed)
Primary Care Provider: Reynold Bowen, MD Cardiologist: Glenetta Hew, MD Electrophysiologist: None  Nephrologist: Dr. Moshe Cipro  Clinic Note: Chief Complaint  Patient presents with  . Follow-up    3 weeks.  Marland Kitchen Headache  . Edema    HPI:    Lance Collier is a 74 y.o. male with a cardiac history noted below who presents today for work in visit for worsening shortness of breath.   CAD:   ? December 2012: 2 overlapping DES to RCA;  ? January 2015: CABG [facility], SVG-Cx);  ? April 2016 -DES PCI to SVG-RCA.  PAD -most recently left femoral endarterectomy with bilateral common iliac stenting and profundoplasty September 2019 -> unfortunately not much notable improvement in symptoms  Carotid Artery Disease - Left Carotid Endarterectomy November 4193  Chronic Diastolic Heart Failure  CKD 3-4  Lance Collier was last seen on July 21, 2019: indicating that he has been much more short of breath of late.  At least 2 days ago he was extreme profoundly short of breath unable to lie down.  Gasping for breath.  Edema has gotten worse and his abdomen is more swollen.  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  Once that is done, he is fully retired.  Recent Hospitalizations: None   Reviewed  CV studies:    The following studies were reviewed today: (if available, images/films reviewed: From Epic Chart or Care Everywhere) . 08/01/2018-TTE: EF 65%.  No WMA.  Unable to assess diastolic marginal RV PA pressures.  Mild to moderate LA dilation with moderate mild aortic valve sclerosis but no stenosis.   Interval History:   Lance Collier is here today stating that he feels much better.  His swelling is gone down quite a bit.  His shortness of breath is also improved.  He also notes that his abdominal distention is improved.  He says he is less short of breath  walking around but is still quite deconditioned.  Still easily fatigued.  Indicates he feels about 70% better than he did last time.  Continues to deny any chest tightness or pressure with rest or exertion.  Less predominant PND and orthopnea.  He is trying to do more stretching and and and walking type exercising but is still not quite up to this point status.  He says his back is feeling better but is still hurting him.  He denies any chest pain or pressure, but has profound exertional dyspnea out of proportion to previous visits.  Feels easily fatigued early during the day.  CV Review of Symptoms (Summary): positive for - dyspnea on exertion, edema, irregular heartbeat and Improved from last visit.  Still has claudication and less prominent fatigue. negative for - chest pain, orthopnea, palpitations, paroxysmal nocturnal dyspnea, rapid heart rate, shortness of breath or Syncope/near syncope, TIA/amaurosis fugax,   The patient DOES NOT have symptoms concerning for COVID-19 infection (fever, chills, cough, or new shortness of breath).  The patient is practicing social distancing & Masking.    REVIEWED OF SYSTEMS   A comprehensive ROS was performed. Review of Systems  Constitutional: Positive for malaise/fatigue (Notably less fatigued.). Negative for weight loss.  HENT: Negative for congestion and nosebleeds.   Respiratory: Positive for shortness of breath. Negative for cough and wheezing.   Cardiovascular: Positive for claudication (Still limits his walking.) and leg swelling. Negative  for palpitations, orthopnea (Much less) and PND.  Gastrointestinal: Negative for blood in stool, constipation and melena.  Genitourinary: Positive for frequency. Negative for dysuria and hematuria.       Frequent nocturia  Musculoskeletal: Positive for joint pain. Negative for falls.  Neurological: Positive for dizziness and tingling.  Psychiatric/Behavioral: Negative for memory loss. The patient is not  nervous/anxious and does not have insomnia.    I have reviewed and (if needed) personally updated the patient's problem list, medications, allergies, past medical and surgical history, social and family history.   PAST MEDICAL HISTORY   Past Medical History:  Diagnosis Date  . Anemia   . Aortic sclerosis  - without Stenosis  March 2014   Echo: Aortic sclerosis without stenosis. EF 55-60% with normal WM; mild concentric hypertrophy with normal relaxation.  . Arthritis   . CAD (coronary artery disease), autologous vein bypass graft 09/2014   10/03/14: Cath for Class III-IV Angina -- 80% ostial-proximal SVG-RPDA (minimal flow down native PDA from native RCA with 50% proximal and distal, patent LIMA-LAD, SVG-OM with retrograde filling of OM 2. --> 10/12/14: Staged PCI of SVG-RPDA - Xience DES  3.0 mm x 38 mm; (3.4  - 3.3 mm)  . CAD S/P percutaneous coronary angioplasty 06/12/2011; 09/2104   a) 2012: Complex PCI mRCA ([place]) --  2 overlapping Promus element DES stents.; 09/2014:  ost-prox SVG-rPDA - Xience DES 3.0 mm x 38 mm; (3.4  - 3.3 mm)  . CHF (congestive heart failure) ([place])   . Childhood asthma   . CKD (chronic kidney disease), stage III    "mild; related to diabetes" (10/02/2014)  . Complication of anesthesia    "I've had name recall recognition problems since my last anesthesia"  . DM (diabetes mellitus) type II uncontrolled with eye manifestation ([place]) 2013   Diabetic retinopathy with retinal hemorrhages since;, recurrent in August 2014 treated with Avastin shots  . Dyspnea    mainly with exertion  . GERD (gastroesophageal reflux disease)   . Heart murmur    questionable  . High cholesterol   . Hypertension   . Migraine ~ 2004   "once"  . PAD (peripheral artery disease) (HCC)    with claudication  . PONV (postoperative nausea and vomiting)    s/p tonsillectomy  . 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 07/01/2013 (Cath for Cresc[place] Unstable Angina) --> [name]: For LM disease; LIMA-LAD,  SVG-RCA, SCG-Cx; b. 09/2014 Cath/Staged PCI: patent LIMA->LAD, VG->OM1/RI, VG->RCA 80% (3.0x38 Xience Alpine DES on 10/12/2014).  . 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  Likely thromboembolic event with PDA occlusion. 06/12/2011   Post Complex PCI, pt states no mi  . Wears glasses    reading    PAST SURGICAL HISTORY   Past Surgical History:  Procedure Laterality Date  . ABDOMINAL AORTOGRAM W/LOWER EXTREMITY N/A 02/05/2018   Procedure: ABDOMINAL AORTOGRAM W/LOWER EXTREMITY;  Surgeon: Elam Dutch, MD;  Location: [place] [facility];  Service: Cardiovascular;  Laterality: N/A;  . COLONOSCOPY    . CORONARY ARTERY BYPASS GRAFT N/A 07/01/2013   Procedure: CORONARY ARTERY BYPASS GRAFTING (CABG);  Surgeon: Grace Isaac, MD;  Location: [place];  Service: Open Heart Surgery;  Laterality: N/A;  Times 3 using left internal mammary artery and endoscopically harvested bilateral saphenous vein  . ENDARTERECTOMY Left 05/21/2016   Procedure: LEFT CAROTID ENDARTERECTOMY;  Surgeon: Elam Dutch, MD;  Location: [place];  Service: Vascular;  Laterality: Left;  . ENDARTERECTOMY FEMORAL Left 02/23/2018   Procedure: LEFT COMMON FEMORAL ENDARTERECTOMY;  Surgeon: Elam Dutch, MD;  Location: [place];  Service: Vascular;  Laterality: Left;  . EYE SURGERY Bilateral    ioc for cataracts  . INSERTION OF ILIAC STENT Bilateral 02/23/2018   Procedure: INSERTION OF BILATERAL COMMON ILIAC STENTS WITH VIABAHN STENTS;  Surgeon: Elam Dutch, MD;  Location: [place];  Service: Vascular;  Laterality: Bilateral;  . INTRAOPERATIVE TRANSESOPHAGEAL ECHOCARDIOGRAM N/A 07/01/2013   Procedure: INTRAOPERATIVE TRANSESOPHAGEAL ECHOCARDIOGRAM;  Surgeon: Grace Isaac, MD;  Location: [place];  Service: Open Heart Surgery;  Laterality: N/A;  . LEFT AND RIGHT HEART CATHETERIZATION WITH CORONARY/GRAFT ANGIOGRAM N/A 10/03/2014   Procedure: LEFT AND RIGHT HEART CATHETERIZATION WITH  Beatrix Fetters;  Surgeon: Leonie Man, MD;  Location: [facility] CATH LAB;  Service: Cardiovascular; severe prox RCA 80% (dRCA w/ competetive flow & 60% hazy ISR in Native RCA), patent LIMA-LAD  & SVG-OM1-RI - retrograde fills OM2, known native LM & mod OM2.  Severe Systemic HTN - LVEDP/PCWP 22 & 28 mmHg, Normal CO/CI  . LEFT HEART CATHETERIZATION WITH CORONARY ANGIOGRAM N/A 06/11/2011   Procedure: LEFT HEART CATHETERIZATION WITH CORONARY ANGIOGRAM;  Surgeon: Fulton Reek, MD;  Location: [place] CATH LAB;;  Dense LM & prox LAD, prox RCA calcification. Large bifurcating OM1 & small OM2 normal. Minor LAD & D1 dz, tortuous prox-mid RCA w/ 60% &  subtotal occluded dRCA w/ normal PDA & PLV   . LEFT HEART CATHETERIZATION WITH CORONARY ANGIOGRAM N/A 06/28/2013   Procedure: LEFT HEART CATHETERIZATION WITH CORONARY ANGIOGRAM;  Surgeon: Leonie Man, MD;  Location: [facility] CATH LAB;  Service: Cardiovascular;  ostial LM disese, RCA ~40-50%ISR  . NM MYOVIEW LTD  04/29/2018   LEXISCAN: Non-gated study with small fixed moderate intensity defect in the apical inferior wall suspicious for either small area of infarction versus attenuation (no change from previous study).  LOW RISK.   Marland Kitchen PATCH ANGIOPLASTY Left 05/21/2016   Procedure: PATCH ANGIOPLASTY USING HEMASHIELD PLATINUM FINESSE 0.3in x 3 in;  Surgeon: Elam Dutch, MD;  Location: [place];  Service: Vascular;  Laterality: Left;  . PATCH ANGIOPLASTY Left 02/23/2018   Procedure: PATCH ANGIOPLASTY LEFT COMMON FEMORAL ARTERY;  Surgeon: Elam Dutch, MD;  Location: [place];  Service: Vascular;  Laterality: Left;  . PERCUTANEOUS CORONARY STENT INTERVENTION (PCI-S)  06/11/2011   Procedure: PERCUTANEOUS CORONARY STENT INTERVENTION (PCI-S);  Surgeon: Fulton Reek, MD;  Location: [facility] CATH LAB;  Service: Cardiovascular;;2 overlapping Promus DES 2.8mm at 12 mm x2; postdilated to 3 mm  . PERCUTANEOUS CORONARY STENT INTERVENTION (PCI-S) N/A 10/12/2014   Procedure:  PERCUTANEOUS CORONARY STENT INTERVENTION (PCI-S);  Surgeon: Leonie Man, MD;  Location: [facility] CATH LAB;  Ostial-Prox SVG-RCA Xience Alpine DES 3.0 mm x 38 mm; (3.4  - 3.3 mm)  . RETINAL LASER PROCEDURE Bilateral    "more than once"   . TONSILLECTOMY  as child  . TRANSTHORACIC ECHOCARDIOGRAM  03/2017    EF 55-60%.  Normal wall thickness.  No regional wall motion normalities.  GR 2 DD.  Mild-moderate calcified aortic valve.  Mild RV systolic dysfunction.  Moderate RV dilation.  Moderately increased PA pressures.    MEDICATIONS/ALLERGIES   Current Meds  Medication Sig  . acetaminophen (TYLENOL) 325 MG tablet Take 650 mg by mouth daily as needed for mild pain or headache.   Marland Kitchen amLODipine (NORVASC) 10 MG tablet Take 1 tablet by mouth daily.  Marland Kitchen  aspirin EC 81 MG tablet Take 1 tablet (81 mg total) by mouth every other day. (Patient taking differently: Take 81 mg by mouth See admin instructions. Take 81mg  once daily on Tues, Thurs, Sat, and Sun)  . clopidogrel (PLAVIX) 75 MG tablet TAKE 1 TABLET DAILY WITH BREAKFAST.  Marland Kitchen Continuous Blood Gluc Sensor (FREESTYLE LIBRE 14 DAY SENSOR) MISC MONITOR BS CONTINUOUSLY  CHANGE Q 14 DAYS.  Marland Kitchen docusate sodium (STOOL SOFTENER) 100 MG capsule Take 100 mg by mouth daily as needed for mild constipation.   . Evolocumab (REPATHA SURECLICK) 381 MG/ML SOAJ Inject 140 mg into the skin every 14 (fourteen) days.  . fenofibrate 160 MG tablet Take 1 tablet (160 mg total) by mouth daily.  . furosemide (LASIX) 80 MG tablet Take 2 tablets (160 mg total) by mouth 2 (two) times daily.  Marland Kitchen HUMALOG KWIKPEN 200 UNIT/ML SOPN Inject 20-28 Units into the skin See admin instructions. Use 20 units (base) per meal increase units given by 1 unit for every 15 increment over 150 (I.E. Blood glucose reading 165=21 units, 180=22 units, etc. )  . hydrALAZINE (APRESOLINE) 100 MG tablet TAKE 1 TABLET BY MOUTH 3 TIMES A DAY.  . isosorbide mononitrate (IMDUR) 30 MG 24 hr tablet Take 1 tablet (30 mg  total) by mouth every evening. This is in addition to 60 mg in the morning.  . isosorbide mononitrate (IMDUR) 60 MG 24 hr tablet TAKE 1 TABLET ONCE DAILY.  . pantoprazole (PROTONIX) 40 MG tablet TAKE 1 TABLET ONCE DAILY.  . rosuvastatin (CRESTOR) 10 MG tablet TAKE 1 TABLET ON TUESDAY, THURSDAY, SATURDAY AND SUNDAY.  Marland Kitchen TOUJEO MAX SOLOSTAR 300 UNIT/ML SOPN Inject 68 Units into the skin 2 (two) times daily.   Marland Kitchen VASCEPA 1 G CAPS Take 2 g by mouth 2 (two) times daily.   . [DISCONTINUED] carvedilol (COREG) 12.5 MG tablet TAKE 1 TABLET BY MOUTH TWICE DAILY.  . [DISCONTINUED] metolazone (ZAROXOLYN) 5 MG tablet Take 30 min prior to morning dose of lasix , 4 days on /2days off  as directed    Allergies  Allergen Reactions  . Atorvastatin Other (See Comments)    Leg pain  . Ibuprofen Hives  . Pravastatin Other (See Comments)    Leg pain  . Simvastatin Other (See Comments)    Leg pain    SOCIAL HISTORY/FAMILY HISTORY   Social History   Tobacco Use  . 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    Years since quitting: 22.7  . Smokeless tobacco: Never Used  Substance Use Topics  . Alcohol use: Yes    Comment: rarely  . 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  He has 1 account that he still trying to complete.     He has social alcoholic beverages but is trying to cut that back and does not smoke.       He tells me he is a sucker for chips but does not eat sweets because of diabetes.    Family History family history includes Breast cancer in his mother and sister; Colon polyps in his father; Hyperlipidemia in his father; Hypertension in his father; Liver cancer in his maternal grandmother; Lung cancer in his sister; Throat cancer in his father.  OBJCTIVE -PE, EKG, labs   Wt Readings from Last 3 Encounters:  08/10/19 221 lb (100.2 kg)  07/21/19 228 lb 3.2 oz (103.5 kg)  06/23/19 211 lb (95.7 kg)  Physical Exam: BP (!) 138/50  (BP Location: Left Arm, Patient Position: Sitting, Cuff Size: Normal)   Pulse (!) 57   Temp 97.7 F (36.5 C)   Ht 5\' 8"  (1.727 m)   Wt 221 lb (100.2 kg)   BMI 33.60 kg/m  Physical Exam  Constitutional: He is oriented to person, place, and time. No distress.  Tired, chronically ill-appearing gentleman.  Nontoxic.  Looks older than stated age  HENT:  Head: Normocephalic and atraumatic.  Neck: Hepatojugular reflux (Less prominent) and JVD (7cmH2O) present. Normal carotid pulses present. Carotid bruit is present (Bilateral).  Cardiovascular: Normal rate, regular rhythm and S1 normal.  Occasional extrasystoles are present. PMI is not displaced. Exam reveals gallop, S4, distant heart sounds and decreased pulses (Bilateral pedal pulses very weak and faint.). Exam reveals no friction rub.  Murmur heard.  Harsh crescendo-decrescendo midsystolic murmur is present with a grade of 2/6 at the upper right sternal border radiating to the neck. High-pitched blowing holosystolic murmur of grade 1/6 is also present at the lower left sternal border and apex. Split S2  Pulmonary/Chest: Effort normal. No respiratory distress. He has no wheezes. He exhibits no tenderness.  Diminished bibasal breath sounds but no obvious rales.  Abdominal: Soft. Bowel sounds are normal. There is no abdominal tenderness. There is no rebound.  Less distended/firm abdomen  Musculoskeletal:        General: Edema (1-2 bilateral edema from knee down (left greater than right)) present. Normal range of motion.     Cervical back: Normal range of motion and neck supple.  Neurological: He is alert and oriented to person, place, and time.  Skin: He is not diaphoretic.  Psychiatric: He has a normal mood and affect. His behavior is normal. Judgment and thought content normal.  Vitals reviewed.   Adult ECG Report  Rate: 60 ;  Rhythm: normal sinus rhythm and 1 degree AVB.  Left axis deviation.  LVH with repolarization changes.;  left  atrial enlargement  Narrative Interpretation: Stable  Recent Labs: 07/01/2019: TC 228, TG 396, HDL 23, LDL 126.  Cr 3.0.  Hgb 10.9.   Lab Results  Component Value Date   CREATININE 3.32 (H) 07/28/2019   BUN 73 (H) 07/28/2019   NA 134 07/28/2019   K 4.5 07/28/2019   CL 91 (L) 07/28/2019   CO2 25 07/28/2019   Lab Results  Component Value Date   CREATININE 3.19 (H) 08/10/2019   BUN 52 (H) 08/10/2019   NA 135 08/10/2019   K 4.6 08/10/2019   CL 94 (L) 08/10/2019   CO2 23 08/10/2019    Lab Results  Component Value Date   TSH 4.660 (H) 04/13/2018  BNP 450  ASSESSMENT/PLAN    Problem List Items Addressed This Visit    Coronary artery disease involving coronary bypass graft with angina pectoris (HCC) (Chronic)    6 years from CABG, 5 years from recent PCI.  No significant anginal symptoms.  Most recent Myoview was in 2019.  Had a low risk study.  Plan: Continue aspirin and Plavix along with carvedilol and amlodipine.  Not on ACE inhibitor or ARB because of renal sufficiency.  Is on hydralazine and Imdur for combination antianginal and afterload reduction.  Remains on Repatha along with Crestor 4 days a week.  Also taking fenofibrate      Relevant Orders   EKG 12-Lead (Completed)   Comprehensive metabolic panel (Completed)   CBC (Completed)   CAD S/P PCI SVG-RCA Xience Alpine DES 3.0 mm  x 38 mm (ostial-prox) - 3.33mm (Chronic)    Remains on aspirin and Plavix.  This is for both combination of CAD and PAD/carotid disease.  Continues on carvedilol and amlodipine along with 90 mg of Imdur.   Okay to aspirin/Plavix for procedures 5 to 7 days preop.      Essential hypertension (Chronic)    Borderline blood pressure today on current meds.  No change for now.      Hyperlipidemia with target LDL less than 70; intolerance to atorvastatin and simvastatin (Chronic)    Recent labs are not as good as before.  Triglycerides are elevated  He is on Repatha as well as rosuvastatin,  fenofibrate and Vascepa.  Likely rechecking July timeframe.      Obesity (BMI 30-39.9) (Chronic)    He is trying to pick up some his exercise activity try to do exercise bike and stretching activities.  I told that he would probably benefit from water aerobics once he is able to fully retire.      Left main coronary artery disease; with RCA in-stent restenosis (Chronic)   Relevant Orders   Comprehensive metabolic panel (Completed)   CBC (Completed)   CKD (chronic kidney disease), stage III (Chronic)    I am concerned about his creatinine going up.  He definitely needs to have higher dose of diuretic recently for his exacerbation, but now his creatinine is more like 3.  I told him he needs to follow-up with his nephrologist.  He still responds to Lasix, but is on very high dose.  Hopefully twice a week addition of metolazone will keep him from having further exacerbations.  I recommended waiting 2 weeks before administering more metolazone.  Otherwise can use it as needed 1 to 2 days for worsening edema.      Relevant Orders   Comprehensive metabolic panel (Completed)   CBC (Completed)   Acute on chronic diastolic heart failure ([place]) - Primary    His acute exacerbation seems to be improving.  Edema improved.  Unfortunately his creatinine went up considerably.  It is better today, but continues to be more increased than I would like.  He is on significant dose of hydralazine and Imdur for afterload reduction along with carvedilol and amlodipine.   Noted renal function improved, but still a bit too high.  Plan to wait 1 more week and reassess labs.  At that time we can try to go to metolazone 5 mg 2 times a week along with same dose of Lasix.  For bad spells do it 2 days in a row.  Recommended that he discuss with his nephrologist.  Also little concerned about anemia.      Relevant Orders   EKG 12-Lead (Completed)   Comprehensive metabolic panel (Completed)   CBC (Completed)       COVID-19 Education: The signs and symptoms of COVID-19 were discussed with the patient and how to seek care for testing (follow up with PCP or arrange E-visit).   The importance of social distancing was discussed today.  I spent a total of 22 minutes with the patient. >  50% of the time was spent in direct patient consultation.  Additional time spent with chart review  / charting (studies, outside notes, etc): 8 Total Time: 32 min    Current medicines are reviewed at length with the patient today.  (+/- concerns) n/a   Patient Instructions / Medication Changes & Studies & Tests Ordered   Patient Instructions  Medication Instructions:  No change at present time - awaiting result of today's labs  *If you need a refill on your cardiac medications before your next appointment, please call your pharmacy*  Lab Work: cmp Cbc- today  If you have labs (blood work) drawn today and your tests are completely normal, you will receive your results only by: Marland Kitchen MyChart Message (if you have MyChart) OR . A paper copy in the mail If you have any lab test that is abnormal or we need to change your treatment, we will call you to review the results.  Testing/Procedures: Not needed  Follow-Up: At [facility], you and your health needs are our priority.  As part of our continuing mission to provide you with exceptional heart care, we have created designated Provider Care Teams.  These Care Teams include your primary Cardiologist (physician) and Advanced Practice Providers (APPs -  Physician Assistants and Nurse Practitioners) who all work together to provide you with the care you need, when you need it.  Your next appointment:   3 month(s) May 2021  The format for your next appointment:   In Person  Provider:   Glenetta Hew, MD  Other Instructions n/a  Studies Ordered:   Orders Placed This Encounter  Procedures  . Comprehensive metabolic panel  . CBC  . EKG 12-Lead    Glenetta Hew, M.D., M.S. Interventional Cardiologist   Pager # [phone] Phone # [phone] [address] [place], [place] [address]   Thank you for choosing Heartcare at [facility]!!

## 2019-08-10 NOTE — Patient Instructions (Signed)
Medication Instructions:  No change at present time - awaiting result of today's labs  *If you need a refill on your cardiac medications before your next appointment, please call your pharmacy*  Lab Work: cmp Cbc- today  If you have labs (blood work) drawn today and your tests are completely normal, you will receive your results only by: Marland Kitchen MyChart Message (if you have MyChart) OR . A paper copy in the mail If you have any lab test that is abnormal or we need to change your treatment, we will call you to review the results.  Testing/Procedures: Not needed  Follow-Up: At [facility], you and your health needs are our priority.  As part of our continuing mission to provide you with exceptional heart care, we have created designated Provider Care Teams.  These Care Teams include your primary Cardiologist (physician) and Advanced Practice Providers (APPs -  Physician Assistants and Nurse Practitioners) who all work together to provide you with the care you need, when you need it.  Your next appointment:   3 month(s) May 2021  The format for your next appointment:   In Person  Provider:   Glenetta Hew, MD  Other Instructions n/a

## 2019-08-11 ENCOUNTER — Telehealth: Payer: Self-pay | Admitting: *Deleted

## 2019-08-11 ENCOUNTER — Other Ambulatory Visit: Payer: Self-pay | Admitting: Cardiology

## 2019-08-11 LAB — COMPREHENSIVE METABOLIC PANEL
ALT: 31 IU/L (ref 0–44)
AST: 35 IU/L (ref 0–40)
Albumin/Globulin Ratio: 1.6 (ref 1.2–2.2)
Albumin: 4.2 g/dL (ref 3.7–4.7)
Alkaline Phosphatase: 80 IU/L (ref 39–117)
BUN/Creatinine Ratio: 16 (ref 10–24)
BUN: 52 mg/dL — ABNORMAL HIGH (ref 8–27)
Bilirubin Total: 0.2 mg/dL (ref 0.0–1.2)
CO2: 23 mmol/L (ref 20–29)
Calcium: 9.5 mg/dL (ref 8.6–10.2)
Chloride: 94 mmol/L — ABNORMAL LOW (ref 96–106)
Creatinine, Ser: 3.19 mg/dL — ABNORMAL HIGH (ref 0.76–1.27)
GFR calc Af Amer: 21 mL/min/{1.73_m2} — ABNORMAL LOW (ref >59)
GFR calc non Af Amer: 18 mL/min/{1.73_m2} — ABNORMAL LOW (ref >59)
Globulin, Total: 2.7 g/dL (ref 1.5–4.5)
Glucose: 171 mg/dL — ABNORMAL HIGH (ref 65–99)
Potassium: 4.6 mmol/L (ref 3.5–5.2)
Sodium: 135 mmol/L (ref 134–144)
Total Protein: 6.9 g/dL (ref 6.0–8.5)

## 2019-08-11 LAB — CBC
Hematocrit: 30.2 % — ABNORMAL LOW (ref 37.5–51.0)
Hemoglobin: 9.9 g/dL — ABNORMAL LOW (ref 13.0–17.7)
MCH: 27.5 pg (ref 26.6–33.0)
MCHC: 32.8 g/dL (ref 31.5–35.7)
MCV: 84 fL (ref 79–97)
Platelets: 265 10*3/uL (ref 150–450)
RBC: 3.6 x10E6/uL — ABNORMAL LOW (ref 4.14–5.80)
RDW: 12.8 % (ref 11.6–15.4)
WBC: 6.3 10*3/uL (ref 3.4–10.8)

## 2019-08-11 NOTE — Telephone Encounter (Signed)
-----   Message from Leonie Man, MD sent at 08/11/2019  4:58 PM EST ----- Kidney function has drifted back down a little bit, still a little bit high for my liking.  Lets wait another week before we try to consider doing the metolazone twice a week.  We can go and call in the prescription for metolazone 5 mg once daily-2 days/week.  Pick 2 days-Monday and Friday but you will take the metolazone half an hour prior to your Lasix.  If you have a bad spell of swelling or worsening shortness of breath use it 2 days in a row, then 2 days off.  Would probably be a good idea for you to touch base with the nephrologist just so that they know that your kidney function is being a little finicky.  About a year ago you had the same issue but not quite as bad.  You are little anemic compared to last year.  Another reason to discuss supplementation with your PCP and/or nephrologist.  This probably has to do with kidney disease.  Glenetta Hew, MD

## 2019-08-11 NOTE — Telephone Encounter (Signed)
LEFT MESSAGE FOR PATIENT TO READ RESULT INFORMATION IN [place] . IF ANY QUESTION MAY CALL BACK . CONTACT NEPHROLOGIST  ABOUT KIDNEY FUNCTIONS  LABS ROUTED . NAY QUESTION PLEASE CALL BACK

## 2019-08-16 DIAGNOSIS — I10 Essential (primary) hypertension: Secondary | ICD-10-CM | POA: Diagnosis not present

## 2019-08-16 DIAGNOSIS — E1139 Type 2 diabetes mellitus with other diabetic ophthalmic complication: Secondary | ICD-10-CM | POA: Diagnosis not present

## 2019-08-16 DIAGNOSIS — Z794 Long term (current) use of insulin: Secondary | ICD-10-CM | POA: Diagnosis not present

## 2019-08-16 DIAGNOSIS — N184 Chronic kidney disease, stage 4 (severe): Secondary | ICD-10-CM | POA: Diagnosis not present

## 2019-08-20 NOTE — Assessment & Plan Note (Signed)
His acute exacerbation seems to be improving.  Edema improved.  Unfortunately his creatinine went up considerably.  It is better today, but continues to be more increased than I would like.  He is on significant dose of hydralazine and Imdur for afterload reduction along with carvedilol and amlodipine.   Noted renal function improved, but still a bit too high.  Plan to wait 1 more week and reassess labs.  At that time we can try to go to metolazone 5 mg 2 times a week along with same dose of Lasix.  For bad spells do it 2 days in a row.  Recommended that he discuss with his nephrologist.  Also little concerned about anemia.

## 2019-08-20 NOTE — Assessment & Plan Note (Signed)
Recent labs are not as good as before.  Triglycerides are elevated  He is on Repatha as well as rosuvastatin, fenofibrate and Vascepa.  Likely rechecking July timeframe.

## 2019-08-20 NOTE — Assessment & Plan Note (Signed)
I am concerned about his creatinine going up.  He definitely needs to have higher dose of diuretic recently for his exacerbation, but now his creatinine is more like 3.  I told him he needs to follow-up with his nephrologist.  He still responds to Lasix, but is on very high dose.  Hopefully twice a week addition of metolazone will keep him from having further exacerbations.  I recommended waiting 2 weeks before administering more metolazone.  Otherwise can use it as needed 1 to 2 days for worsening edema.

## 2019-08-20 NOTE — Assessment & Plan Note (Signed)
Remains on aspirin and Plavix.  This is for both combination of CAD and PAD/carotid disease.  Continues on carvedilol and amlodipine along with 90 mg of Imdur.   Okay to aspirin/Plavix for procedures 5 to 7 days preop.

## 2019-08-20 NOTE — Assessment & Plan Note (Signed)
He is trying to pick up some his exercise activity try to do exercise bike and stretching activities.  I told that he would probably benefit from water aerobics once he is able to fully retire.

## 2019-08-20 NOTE — Assessment & Plan Note (Signed)
6 years from CABG, 5 years from recent PCI.  No significant anginal symptoms.  Most recent Myoview was in 2019.  Had a low risk study.  Plan: Continue aspirin and Plavix along with carvedilol and amlodipine.  Not on ACE inhibitor or ARB because of renal sufficiency.  Is on hydralazine and Imdur for combination antianginal and afterload reduction.  Remains on Repatha along with Crestor 4 days a week.  Also taking fenofibrate

## 2019-08-20 NOTE — Assessment & Plan Note (Signed)
Borderline blood pressure today on current meds.  No change for now.

## 2019-08-24 DIAGNOSIS — R609 Edema, unspecified: Secondary | ICD-10-CM | POA: Diagnosis not present

## 2019-08-24 DIAGNOSIS — D649 Anemia, unspecified: Secondary | ICD-10-CM | POA: Diagnosis not present

## 2019-08-24 DIAGNOSIS — I2510 Atherosclerotic heart disease of native coronary artery without angina pectoris: Secondary | ICD-10-CM | POA: Diagnosis not present

## 2019-08-24 DIAGNOSIS — Z6831 Body mass index (BMI) 31.0-31.9, adult: Secondary | ICD-10-CM | POA: Diagnosis not present

## 2019-08-24 DIAGNOSIS — I739 Peripheral vascular disease, unspecified: Secondary | ICD-10-CM | POA: Diagnosis not present

## 2019-08-24 DIAGNOSIS — I129 Hypertensive chronic kidney disease with stage 1 through stage 4 chronic kidney disease, or unspecified chronic kidney disease: Secondary | ICD-10-CM | POA: Diagnosis not present

## 2019-08-24 DIAGNOSIS — E785 Hyperlipidemia, unspecified: Secondary | ICD-10-CM | POA: Diagnosis not present

## 2019-08-24 DIAGNOSIS — N184 Chronic kidney disease, stage 4 (severe): Secondary | ICD-10-CM | POA: Diagnosis not present

## 2019-09-06 ENCOUNTER — Ambulatory Visit: Payer: Medicare Other | Admitting: Cardiology

## 2019-09-06 DIAGNOSIS — H04123 Dry eye syndrome of bilateral lacrimal glands: Secondary | ICD-10-CM | POA: Diagnosis not present

## 2019-09-06 DIAGNOSIS — E113593 Type 2 diabetes mellitus with proliferative diabetic retinopathy without macular edema, bilateral: Secondary | ICD-10-CM | POA: Diagnosis not present

## 2019-09-06 DIAGNOSIS — H0102A Squamous blepharitis right eye, upper and lower eyelids: Secondary | ICD-10-CM | POA: Diagnosis not present

## 2019-09-06 DIAGNOSIS — H10413 Chronic giant papillary conjunctivitis, bilateral: Secondary | ICD-10-CM | POA: Diagnosis not present

## 2019-09-06 DIAGNOSIS — H18423 Band keratopathy, bilateral: Secondary | ICD-10-CM | POA: Diagnosis not present

## 2019-09-06 DIAGNOSIS — Z961 Presence of intraocular lens: Secondary | ICD-10-CM | POA: Diagnosis not present

## 2019-09-06 DIAGNOSIS — H0102B Squamous blepharitis left eye, upper and lower eyelids: Secondary | ICD-10-CM | POA: Diagnosis not present

## 2019-09-08 ENCOUNTER — Telehealth: Payer: Self-pay | Admitting: Cardiology

## 2019-09-08 DIAGNOSIS — D631 Anemia in chronic kidney disease: Secondary | ICD-10-CM | POA: Diagnosis not present

## 2019-09-08 NOTE — Telephone Encounter (Signed)
LMTCB

## 2019-09-08 NOTE — Telephone Encounter (Signed)
  Pt c/o Shortness Of Breath: STAT if SOB developed within the last 24 hours or pt is noticeably SOB on the phone  1. Are you currently SOB (can you hear that pt is SOB on the phone)? no  2. How long have you been experiencing SOB? 2 or 3 days  3. Are you SOB when sitting or when up moving around? both  4. Are you currently experiencing any other symptoms? Fatigue    Patient states for the last 2 or 3 days he has been SOB and has no energy. He says he has to sit up in a chair at night and would like to know if he should he see a pulmonologist.

## 2019-09-08 NOTE — Telephone Encounter (Signed)
Pt called to report that he has been having worsening of his SOB and peripheral edema over the past few days.Marland Kitchen He reports improvement after his 08/13/19 OV but it now has been worsening the last few days.   Pt says he is struggling to wear his normal shoes, he cannot walk to the mailbox and back without getting very SOB, his pants waist is feeling tight.  He is sleeping in the chair to sleep the last few nights.   I offered an appt this afternoon but the pt has an Infusion today that last 3-4 hours so unable to come into our office.   Will forward to Dr. Ellyn Hack for review and recommendations.

## 2019-09-08 NOTE — Telephone Encounter (Signed)
Follow up ° ° °Patient is returning your call. Please call. ° ° ° °

## 2019-09-11 NOTE — Telephone Encounter (Signed)
We should have him come in to be seen if he still having symptoms, but the plan for now would be for him to increase his Lasix dose to double dose in the morning.  First day he should take the metolazone additionally.  Take double dose in the morning until breathing is improved. Use metolazone every other day also until breathing improved.  Would need to check chemistry panel by midweek.  Glenetta Hew, MD

## 2019-09-12 ENCOUNTER — Encounter: Payer: Self-pay | Admitting: Adult Health

## 2019-09-12 ENCOUNTER — Telehealth (INDEPENDENT_AMBULATORY_CARE_PROVIDER_SITE_OTHER): Payer: Medicare Other | Admitting: Adult Health

## 2019-09-12 VITALS — BP 133/59 | Ht 68.0 in | Wt 215.0 lb

## 2019-09-12 DIAGNOSIS — Z87891 Personal history of nicotine dependence: Secondary | ICD-10-CM

## 2019-09-12 DIAGNOSIS — I10 Essential (primary) hypertension: Secondary | ICD-10-CM

## 2019-09-12 DIAGNOSIS — N189 Chronic kidney disease, unspecified: Secondary | ICD-10-CM | POA: Diagnosis not present

## 2019-09-12 DIAGNOSIS — H4312 Vitreous hemorrhage, left eye: Secondary | ICD-10-CM | POA: Diagnosis not present

## 2019-09-12 DIAGNOSIS — H3563 Retinal hemorrhage, bilateral: Secondary | ICD-10-CM | POA: Diagnosis not present

## 2019-09-12 DIAGNOSIS — I130 Hypertensive heart and chronic kidney disease with heart failure and stage 1 through stage 4 chronic kidney disease, or unspecified chronic kidney disease: Secondary | ICD-10-CM | POA: Diagnosis not present

## 2019-09-12 DIAGNOSIS — I5032 Chronic diastolic (congestive) heart failure: Secondary | ICD-10-CM | POA: Diagnosis not present

## 2019-09-12 DIAGNOSIS — E1139 Type 2 diabetes mellitus with other diabetic ophthalmic complication: Secondary | ICD-10-CM | POA: Diagnosis not present

## 2019-09-12 DIAGNOSIS — Z7982 Long term (current) use of aspirin: Secondary | ICD-10-CM

## 2019-09-12 DIAGNOSIS — I2510 Atherosclerotic heart disease of native coronary artery without angina pectoris: Secondary | ICD-10-CM | POA: Diagnosis not present

## 2019-09-12 DIAGNOSIS — E113512 Type 2 diabetes mellitus with proliferative diabetic retinopathy with macular edema, left eye: Secondary | ICD-10-CM | POA: Diagnosis not present

## 2019-09-12 DIAGNOSIS — Z9861 Coronary angioplasty status: Secondary | ICD-10-CM

## 2019-09-12 LAB — HM DIABETES EYE EXAM

## 2019-09-12 NOTE — Telephone Encounter (Signed)
Spoke with patient. Patient still having shortness of breath. Patient takes 160mg  of Lasix twice a day. Patient does not have Metolazone, Metolazone is also not on patient's medication list. Patient upset he hasn't heard from anyone since Thursday. Patient would like to be evaluated. Apologized and scheduled patient for an 8:45am virtual visit with Jory Sims today to discuss medications and evaluate shortness of breath.

## 2019-09-12 NOTE — Progress Notes (Signed)
Virtual Visit via Telephone Note   This visit type was conducted due to national recommendations for restrictions regarding the COVID-19 Pandemic (e.g. social distancing) in an effort to limit this patient's exposure and mitigate transmission in our community.  Due to his co-morbid illnesses, this patient is at least at moderate risk for complications without adequate follow up.  This format is felt to be most appropriate for this patient at this time.  The patient did not have access to video technology/had technical difficulties with video requiring transitioning to audio format only (telephone).  All issues noted in this document were discussed and addressed.  No physical exam could be performed with this format.  Please refer to the patient's chart for his  consent to telehealth for [facility].   Date:  09/12/2019   ID:  Lance Collier, DOB May 05, 1946, MRN [id number]  Patient Location: Home Provider Location: Home  PCP:  Reynold Bowen, MD  Cardiologist:  Glenetta Hew, MD  Electrophysiologist:  None   Evaluation Performed:  Follow-Up Visit  Chief Complaint:  Swelling  History of Present Illness:    Lance Collier is a 74 y.o. male we are following for ongoing assessment and management of CAD, PAD, carotid artery disease, chronic diastolic CHF  CAD:   ? December 2012: 2 overlapping DES to RCA;  ? January 2015: CABG [facility], SVG-Cx);  ? April 2016 -DES PCI to SVG-RCA.   He called concern for worsening dyspnea, PND, LEE with uncertain weight gain. He does not weigh himself regularly. He states he has swelling up to his thighs.  He was told this am in response to his call 4 days ago that he was to increase his lasix to twice the dose in the am, and take metolazone. He states that he has not taken his metolazone because he could not find it.   Most recent echocardiogram on 08/02/2019 revealed normal LV systolic function with EF of 60 to 65%.  There were no significant  valvular abnormalities  The patient does not have symptoms concerning for COVID-19 infection (fever, chills, cough, or new shortness of breath).   Past Medical History:  Diagnosis Date  . Anemia   . Aortic sclerosis  - without Stenosis  March 2014   Echo: Aortic sclerosis without stenosis. EF 55-60% with normal WM; mild concentric hypertrophy with normal relaxation.  . Arthritis   . CAD (coronary artery disease), autologous vein bypass graft 09/2014   10/03/14: Cath for Class III-IV Angina -- 80% ostial-proximal SVG-RPDA (minimal flow down native PDA from native RCA with 50% proximal and distal, patent LIMA-LAD, SVG-OM with retrograde filling of OM 2. --> 10/12/14: Staged PCI of SVG-RPDA - Xience DES  3.0 mm x 38 mm; (3.4  - 3.3 mm)  . CAD S/P percutaneous coronary angioplasty 06/12/2011; 09/2104   a) 2012: Complex PCI mRCA ([place]) --  2 overlapping Promus element DES stents.; 09/2014:  ost-prox SVG-rPDA - Xience DES 3.0 mm x 38 mm; (3.4  - 3.3 mm)  . CHF (congestive heart failure) ([place])   . Childhood asthma   . CKD (chronic kidney disease), stage III    "mild; related to diabetes" (10/02/2014)  . Complication of anesthesia    "I've had name recall recognition problems since my last anesthesia"  . DM (diabetes mellitus) type II uncontrolled with eye manifestation ([place]) 2013   Diabetic retinopathy with retinal hemorrhages since;, recurrent in August 2014 treated with Avastin shots  . Dyspnea    mainly with  exertion  . GERD (gastroesophageal reflux disease)   . Heart murmur    questionable  . High cholesterol   . Hypertension   . Migraine ~ 2004   "once"  . PAD (peripheral artery disease) ([facility])    with claudication  . PONV (postoperative nausea and vomiting)    s/p tonsillectomy  . 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 07/01/2013 (Cath for [place] Unstable Angina) --> [name]: For LM disease; LIMA-LAD, SVG-RCA, SCG-Cx; b. 09/2014 Cath/Staged PCI: patent LIMA->LAD, VG->OM1/RI, VG->RCA 80%  (3.0x38 Xience Alpine DES on 10/12/2014).  . 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  Likely thromboembolic event with PDA occlusion. 06/12/2011   Post Complex PCI, pt states no mi  . Wears glasses    reading   Past Surgical History:  Procedure Laterality Date  . ABDOMINAL AORTOGRAM W/LOWER EXTREMITY N/A 02/05/2018   Procedure: ABDOMINAL AORTOGRAM W/LOWER EXTREMITY;  Surgeon: Elam Dutch, MD;  Location: [place] [facility];  Service: Cardiovascular;  Laterality: N/A;  . COLONOSCOPY    . CORONARY ARTERY BYPASS GRAFT N/A 07/01/2013   Procedure: CORONARY ARTERY BYPASS GRAFTING (CABG);  Surgeon: Grace Isaac, MD;  Location: [place];  Service: Open Heart Surgery;  Laterality: N/A;  Times 3 using left internal mammary artery and endoscopically harvested bilateral saphenous vein  . ENDARTERECTOMY Left 05/21/2016   Procedure: LEFT CAROTID ENDARTERECTOMY;  Surgeon: Elam Dutch, MD;  Location: [place];  Service: Vascular;  Laterality: Left;  . ENDARTERECTOMY FEMORAL Left 02/23/2018   Procedure: LEFT COMMON FEMORAL ENDARTERECTOMY;  Surgeon: Elam Dutch, MD;  Location: [place];  Service: Vascular;  Laterality: Left;  . EYE SURGERY Bilateral    ioc for cataracts  . INSERTION OF ILIAC STENT Bilateral 02/23/2018   Procedure: INSERTION OF BILATERAL COMMON ILIAC STENTS WITH VIABAHN STENTS;  Surgeon: Elam Dutch, MD;  Location: [place];  Service: Vascular;  Laterality: Bilateral;  . INTRAOPERATIVE TRANSESOPHAGEAL ECHOCARDIOGRAM N/A 07/01/2013   Procedure: INTRAOPERATIVE TRANSESOPHAGEAL ECHOCARDIOGRAM;  Surgeon: Grace Isaac, MD;  Location: [place];  Service: Open Heart Surgery;  Laterality: N/A;  . LEFT AND RIGHT HEART CATHETERIZATION WITH CORONARY/GRAFT ANGIOGRAM N/A 10/03/2014   Procedure: LEFT AND RIGHT HEART CATHETERIZATION WITH Beatrix Fetters;  Surgeon: Leonie Man, MD;  Location: [facility] CATH LAB;  Service: Cardiovascular; severe  prox RCA 80% (dRCA w/ competetive flow & 60% hazy ISR in Native RCA), patent LIMA-LAD  & SVG-OM1-RI - retrograde fills OM2, known native LM & mod OM2.  Severe Systemic HTN - LVEDP/PCWP 22 & 28 mmHg, Normal CO/CI  . LEFT HEART CATHETERIZATION WITH CORONARY ANGIOGRAM N/A 06/11/2011   Procedure: LEFT HEART CATHETERIZATION WITH CORONARY ANGIOGRAM;  Surgeon: Fulton Reek, MD;  Location: [place] CATH LAB;;  Dense LM & prox LAD, prox RCA calcification. Large bifurcating OM1 & small OM2 normal. Minor LAD & D1 dz, tortuous prox-mid RCA w/ 60% &  subtotal occluded dRCA w/ normal PDA & PLV   . LEFT HEART CATHETERIZATION WITH CORONARY ANGIOGRAM N/A 06/28/2013   Procedure: LEFT HEART CATHETERIZATION WITH CORONARY ANGIOGRAM;  Surgeon: Leonie Man, MD;  Location: [facility] CATH LAB;  Service: Cardiovascular;  ostial LM disese, RCA ~40-50%ISR  . NM MYOVIEW LTD  04/29/2018   LEXISCAN: Non-gated study with small fixed moderate intensity defect in the apical inferior wall suspicious for either small area of infarction versus attenuation (no change from previous study).  LOW RISK.   Marland Kitchen PATCH ANGIOPLASTY Left  05/21/2016   Procedure: PATCH ANGIOPLASTY USING HEMASHIELD PLATINUM FINESSE 0.3in x 3 in;  Surgeon: Elam Dutch, MD;  Location: [place];  Service: Vascular;  Laterality: Left;  . PATCH ANGIOPLASTY Left 02/23/2018   Procedure: PATCH ANGIOPLASTY LEFT COMMON FEMORAL ARTERY;  Surgeon: Elam Dutch, MD;  Location: [place];  Service: Vascular;  Laterality: Left;  . PERCUTANEOUS CORONARY STENT INTERVENTION (PCI-S)  06/11/2011   Procedure: PERCUTANEOUS CORONARY STENT INTERVENTION (PCI-S);  Surgeon: Fulton Reek, MD;  Location: [facility] CATH LAB;  Service: Cardiovascular;;2 overlapping Promus DES 2.61mm at 12 mm x2; postdilated to 3 mm  . PERCUTANEOUS CORONARY STENT INTERVENTION (PCI-S) N/A 10/12/2014   Procedure: PERCUTANEOUS CORONARY STENT INTERVENTION (PCI-S);  Surgeon: Leonie Man, MD;  Location: [facility] CATH LAB;  Ostial-Prox  SVG-RCA Xience Alpine DES 3.0 mm x 38 mm; (3.4  - 3.3 mm)  . RETINAL LASER PROCEDURE Bilateral    "more than once"   . TONSILLECTOMY  as child  . TRANSTHORACIC ECHOCARDIOGRAM  03/2017    EF 55-60%.  Normal wall thickness.  No regional wall motion normalities.  GR 2 DD.  Mild-moderate calcified aortic valve.  Mild RV systolic dysfunction.  Moderate RV dilation.  Moderately increased PA pressures.     Current Meds  Medication Sig  . acetaminophen (TYLENOL) 325 MG tablet Take 650 mg by mouth daily as needed for mild pain or headache.   Marland Kitchen amLODipine (NORVASC) 10 MG tablet Take 1 tablet by mouth daily.  Marland Kitchen aspirin EC 81 MG tablet Take 1 tablet (81 mg total) by mouth every other day. (Patient taking differently: Take 81 mg by mouth See admin instructions. Take 81mg  once daily on Tues, Thurs, Sat, and Sun)  . carvedilol (COREG) 12.5 MG tablet TAKE 1 TABLET BY MOUTH TWICE DAILY.  Marland Kitchen clopidogrel (PLAVIX) 75 MG tablet TAKE 1 TABLET DAILY WITH BREAKFAST.  Marland Kitchen Continuous Blood Gluc Sensor (FREESTYLE LIBRE 14 DAY SENSOR) MISC MONITOR BS CONTINUOUSLY  CHANGE Q 14 DAYS.  Marland Kitchen docusate sodium (STOOL SOFTENER) 100 MG capsule Take 100 mg by mouth daily as needed for mild constipation.   . Evolocumab (REPATHA SURECLICK) 798 MG/ML SOAJ Inject 140 mg into the skin every 14 (fourteen) days.  . fenofibrate 160 MG tablet Take 1 tablet (160 mg total) by mouth daily.  . furosemide (LASIX) 80 MG tablet Take 2 tablets (160 mg total) by mouth 2 (two) times daily.  Marland Kitchen HUMALOG KWIKPEN 200 UNIT/ML SOPN Inject 20-28 Units into the skin See admin instructions. Use 20 units (base) per meal increase units given by 1 unit for every 15 increment over 150 (I.E. Blood glucose reading 165=21 units, 180=22 units, etc. )  . hydrALAZINE (APRESOLINE) 100 MG tablet TAKE 1 TABLET BY MOUTH 3 TIMES A DAY.  . isosorbide mononitrate (IMDUR) 30 MG 24 hr tablet Take 1 tablet (30 mg total) by mouth every evening. This is in addition to 60 mg in the  morning.  . isosorbide mononitrate (IMDUR) 60 MG 24 hr tablet TAKE 1 TABLET ONCE DAILY.  Marland Kitchen ketoconazole (NIZORAL) 2 % cream As directed  . pantoprazole (PROTONIX) 40 MG tablet TAKE 1 TABLET ONCE DAILY.  . rosuvastatin (CRESTOR) 10 MG tablet TAKE 1 TABLET ON TUESDAY, THURSDAY, SATURDAY AND SUNDAY.  Marland Kitchen TOUJEO MAX SOLOSTAR 300 UNIT/ML SOPN Inject 66 Units into the skin 2 (two) times daily.   Marland Kitchen VASCEPA 1 G CAPS Take 2 g by mouth 2 (two) times daily.      Allergies:   Atorvastatin,  Ibuprofen, Pravastatin, and Simvastatin   Social History   Tobacco Use  . Smoking status: Former Smoker    Packs/day: 2.00    Years: 27.00    Pack years: 54.00    Types: Cigarettes    Quit date: 11/23/1996    Years since quitting: 22.8  . Smokeless tobacco: Never Used  Substance Use Topics  . Alcohol use: Yes    Comment: rarely  . Drug use: No     Family Hx: The patient's family history includes Breast cancer in his mother and sister; Colon polyps in his father; Hyperlipidemia in his father; Hypertension in his father; Liver cancer in his maternal grandmother; Lung cancer in his sister; Throat cancer in his father. There is no history of Stomach cancer, Rectal cancer, Colon cancer, or Esophageal cancer.  ROS:   Please see the history of present illness.    All other systems reviewed and are negative.   Prior CV studies:   The following studies were reviewed today:  Echocardiogram 08/02/2019  1. Left ventricular ejection fraction, by estimation, is 65 to 70%. The  left ventricle has normal function. The left ventrical has no regional  wall motion abnormalities. There is mildly increased left ventricular  hypertrophy. Left ventricular diastolic  parameters are indeterminate.  2. Right ventricular systolic function is normal. The right ventricular  size is mildly enlarged. There is moderately elevated pulmonary artery  systolic pressure.  3. Left atrial size was mild to moderately dilated.  4. Right  atrial size was moderately dilated.  5. No evidence of mitral valve regurgitation.  6. The aortic valve is tricuspid. Aortic valve regurgitation is not  visualized. Mild aortic valve sclerosis is present, with no evidence of  aortic valve stenosis.  7. The inferior vena cava is normal in size with greater than 50%  respiratory variability, suggesting right atrial pressure of 3 mmHg.   Labs/Other Tests and Data Reviewed:    EKG:  No ECG reviewed.  Recent Labs: 07/21/2019: BNP 450.2 08/10/2019: ALT 31; BUN 52; Creatinine, Ser 3.19; Hemoglobin 9.9; Platelets 265; Potassium 4.6; Sodium 135   Recent Lipid Panel Lab Results  Component Value Date/Time   CHOL 150 12/02/2018 09:48 AM   TRIG 341 (H) 12/02/2018 09:48 AM   HDL 23 (L) 12/02/2018 09:48 AM   CHOLHDL 6.5 (H) 12/02/2018 09:48 AM   CHOLHDL 8.8 09/20/2014 08:38 AM   LDLCALC 59 12/02/2018 09:48 AM    Wt Readings from Last 3 Encounters:  09/12/19 215 lb (97.5 kg)  08/10/19 221 lb (100.2 kg)  07/21/19 228 lb 3.2 oz (103.5 kg)     Objective:    Vital Signs:  BP (!) 133/59   Ht 5\' 8"  (1.727 m)   Wt 215 lb (97.5 kg)   BMI 32.69 kg/m    VITAL SIGNS:  reviewed GEN:  no acute distress NEURO:  alert and oriented x 3, no obvious focal deficit PSYCH:  normal affect  ASSESSMENT & PLAN:    1.Chronic Diastolic CHF: He has multiple complaints today via virtual visit. Continued LEE and some PND>  He has found the metolazone. He was just contacted this am to make adjustments in his medications.  He will increase lasix to to 3 80 mg tablets a day.  He will take metolazone 5 mg X 1 today. I will see him in 2 days for evaluation of his response to his increased diuresis. He verbalizes understanding of this plan and need for close follow up. May consider hospitalization  if he does not respond to this plan   2.  CAD: No complaints of chest pain, he will continue with current medication regimen.  3.  Chronic kidney disease: Most recent  creatinine on 08/10/2019 3.19.  He will have repeat BMET on follow-up appointment.  COVID-19 Education: The signs and symptoms of COVID-19 were discussed with the patient and how to seek care for testing (follow up with PCP or arrange E-visit). The importance of social distancing was discussed today.  Time:   Today, I have spent 30 minutes with the patient with telehealth technology discussing the above problems.     Medication Adjustments/Labs and Tests Ordered: Current medicines are reviewed at length with the patient today.  Concerns regarding medicines are outlined above.   Tests Ordered: No orders of the defined types were placed in this encounter.   Medication Changes: No orders of the defined types were placed in this encounter.   Disposition:  Follow up 1 week  Signed, Phill Myron. [place] Pugh, ANP, AACC  09/12/2019 8:55 AM    [facility]

## 2019-09-12 NOTE — Patient Instructions (Signed)
Medication Instructions:  Continue current medications  *If you need a refill on your cardiac medications before your next appointment, please call your pharmacy*   Lab Work: None Ordered   Testing/Procedures: None Ordered   Follow-Up: At [facility], you and your health needs are our priority.  As part of our continuing mission to provide you with exceptional heart care, we have created designated Provider Care Teams.  These Care Teams include your primary Cardiologist (physician) and Advanced Practice Providers (APPs -  Physician Assistants and Nurse Practitioners) who all work together to provide you with the care you need, when you need it.  We recommend signing up for the patient portal called "MyChart".  Sign up information is provided on this After Visit Summary.  MyChart is used to connect with patients for Virtual Visits (Telemedicine).  Patients are able to view lab/test results, encounter notes, upcoming appointments, etc.  Non-urgent messages can be sent to your provider as well.   To learn more about what you can do with MyChart, go to [email or web address].    Your next appointment:   Wednesday March 24th @ 1:15 pm  The format for your next appointment:   In Person  Provider:   Jory Sims, DNP, ANP

## 2019-09-13 NOTE — Progress Notes (Signed)
Cardiology Office Note   Date:  09/13/2019   ID:  Lance Collier, DOB 01-Oct-1945, MRN [id number]  PCP:  Lance Bowen, MD  Cardiologist:  No chief complaint on file.    History of Present Illness: Lance Collier is a 74 y.o. male who presents for ongoing assessment and management of CAD, 2 overlapping drug-eluting stents to the RCA in 2012, CABG in 2015 with LIMA to LAD, SVG to RCA, with SVG to circumflex.  April 2016 DES and PCI to SVG to RCA.  Other history includes chronic lower extremity edema, PND, diastolic heart failure.  He was last encountered via telemedicine for worsening dyspnea and swelling into his thighs.  He was to increase his Lasix to twice 240 mg in the morning with 1 dose of metolazone x1 day.    He is here for close follow-up to evaluate his response to symptoms and for labs.  It is noted that his most recent echocardiogram in February 2021 revealed normal LV systolic function with an EF of 60 to 65% and no valvular abnormalities.  He was made aware via telemedicine that if his symptoms persisted, he remained volume overloaded, we would consider hospitalization.  He comes today feeling better, has lost approximately 6-1/2 pounds.  Lower extremity edema has improved he still has some mild fullness in his abdomen.  He has gone back to his usual dose of Lasix 160 mg in the a.m. and 160 mg in the afternoon.  He is having some muscle cramping.  He does take the Lasix with food.  Past Medical History:  Diagnosis Date   Anemia    Aortic sclerosis  - without Stenosis  March 2014   Echo: Aortic sclerosis without stenosis. EF 55-60% with normal WM; mild concentric hypertrophy with normal relaxation.   Arthritis    CAD (coronary artery disease), autologous vein bypass graft 09/2014   10/03/14: Cath for Class III-IV Angina -- 80% ostial-proximal SVG-RPDA (minimal flow down native PDA from native RCA with 50% proximal and distal, patent LIMA-LAD, SVG-OM with retrograde  filling of OM 2. --> 10/12/14: Staged PCI of SVG-RPDA - Xience DES  3.0 mm x 38 mm; (3.4  - 3.3 mm)   CAD S/P percutaneous coronary angioplasty 06/12/2011; 09/2104   a) 2012: Complex PCI mRCA ([place]) --  2 overlapping Promus element DES stents.; 09/2014:  ost-prox SVG-rPDA - Xience DES 3.0 mm x 38 mm; (3.4  - 3.3 mm)   CHF (congestive heart failure) (HCC)    Childhood asthma    CKD (chronic kidney disease), stage III    "mild; related to diabetes" (09/29/1446)   Complication of anesthesia    "I've had name recall recognition problems since my last anesthesia"   DM (diabetes mellitus) type II uncontrolled with eye manifestation ([place]) 2013   Diabetic retinopathy with retinal hemorrhages since;, recurrent in August 2014 treated with Avastin shots   Dyspnea    mainly with exertion   GERD (gastroesophageal reflux disease)    Heart murmur    questionable   High cholesterol    Hypertension    Migraine ~ 2004   "once"   PAD (peripheral artery disease) (HCC)    with claudication   PONV (postoperative nausea and vomiting)    s/p tonsillectomy   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 07/01/2013 (Cath for Crescendo Unstable Angina) --> [name]: For LM disease; LIMA-LAD, SVG-RCA, SCG-Cx; b. 09/2014 Cath/Staged PCI: patent LIMA->LAD, VG->OM1/RI, VG->RCA 80% (3.0x38 Xience Alpine DES on 10/12/2014).  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  Likely thromboembolic event with PDA occlusion. 06/12/2011   Post Complex PCI, pt states no mi   Wears glasses    reading    Past Surgical History:  Procedure Laterality Date   ABDOMINAL AORTOGRAM W/LOWER EXTREMITY N/A 02/05/2018   Procedure: ABDOMINAL AORTOGRAM W/LOWER EXTREMITY;  Surgeon: Elam Dutch, MD;  Location: [place] [facility];  Service: Cardiovascular;  Laterality: N/A;   COLONOSCOPY     CORONARY ARTERY BYPASS GRAFT N/A 07/01/2013   Procedure: CORONARY ARTERY BYPASS GRAFTING (CABG);   Surgeon: Grace Isaac, MD;  Location: [place];  Service: Open Heart Surgery;  Laterality: N/A;  Times 3 using left internal mammary artery and endoscopically harvested bilateral saphenous vein   ENDARTERECTOMY Left 05/21/2016   Procedure: LEFT CAROTID ENDARTERECTOMY;  Surgeon: Elam Dutch, MD;  Location: [place];  Service: Vascular;  Laterality: Left;   ENDARTERECTOMY FEMORAL Left 02/23/2018   Procedure: LEFT COMMON FEMORAL ENDARTERECTOMY;  Surgeon: Elam Dutch, MD;  Location: [facility] OR;  Service: Vascular;  Laterality: Left;   EYE SURGERY Bilateral    ioc for cataracts   INSERTION OF ILIAC STENT Bilateral 02/23/2018   Procedure: INSERTION OF BILATERAL COMMON ILIAC STENTS WITH VIABAHN STENTS;  Surgeon: Elam Dutch, MD;  Location: [place];  Service: Vascular;  Laterality: Bilateral;   INTRAOPERATIVE TRANSESOPHAGEAL ECHOCARDIOGRAM N/A 07/01/2013   Procedure: INTRAOPERATIVE TRANSESOPHAGEAL ECHOCARDIOGRAM;  Surgeon: Grace Isaac, MD;  Location: [place];  Service: Open Heart Surgery;  Laterality: N/A;   LEFT AND RIGHT HEART CATHETERIZATION WITH CORONARY/GRAFT ANGIOGRAM N/A 10/03/2014   Procedure: LEFT AND RIGHT HEART CATHETERIZATION WITH Beatrix Fetters;  Surgeon: Leonie Man, MD;  Location: [facility] CATH LAB;  Service: Cardiovascular; severe prox RCA 80% (dRCA w/ competetive flow & 60% hazy ISR in Native RCA), patent LIMA-LAD  & SVG-OM1-RI - retrograde fills OM2, known native LM & mod OM2.  Severe Systemic HTN - LVEDP/PCWP 22 & 28 mmHg, Normal CO/CI   LEFT HEART CATHETERIZATION WITH CORONARY ANGIOGRAM N/A 06/11/2011   Procedure: LEFT HEART CATHETERIZATION WITH CORONARY ANGIOGRAM;  Surgeon: Fulton Reek, MD;  Location: [place] CATH LAB;;  Dense LM & prox LAD, prox RCA calcification. Large bifurcating OM1 & small OM2 normal. Minor LAD & D1 dz, tortuous prox-mid RCA w/ 60% &  subtotal occluded dRCA w/ normal PDA & PLV    LEFT HEART CATHETERIZATION WITH CORONARY ANGIOGRAM N/A 06/28/2013    Procedure: LEFT HEART CATHETERIZATION WITH CORONARY ANGIOGRAM;  Surgeon: Leonie Man, MD;  Location: [facility] CATH LAB;  Service: Cardiovascular;  ostial LM disese, RCA ~40-50%ISR   NM MYOVIEW LTD  04/29/2018   LEXISCAN: Non-gated study with small fixed moderate intensity defect in the apical inferior wall suspicious for either small area of infarction versus attenuation (no change from previous study).  LOW RISK.    PATCH ANGIOPLASTY Left 05/21/2016   Procedure: PATCH ANGIOPLASTY USING HEMASHIELD PLATINUM FINESSE 0.3in x 3 in;  Surgeon: Elam Dutch, MD;  Location: [place];  Service: Vascular;  Laterality: Left;   PATCH ANGIOPLASTY Left 02/23/2018   Procedure: PATCH ANGIOPLASTY LEFT COMMON FEMORAL ARTERY;  Surgeon: Elam Dutch, MD;  Location: [facility] OR;  Service: Vascular;  Laterality: Left;   PERCUTANEOUS CORONARY STENT INTERVENTION (PCI-S)  06/11/2011   Procedure: PERCUTANEOUS CORONARY STENT INTERVENTION (PCI-S);  Surgeon: Fulton Reek, MD;  Location: [facility] CATH LAB;  Service: Cardiovascular;;2 overlapping Promus DES 2.22mm at  12 mm x2; postdilated to 3 mm   PERCUTANEOUS CORONARY STENT INTERVENTION (PCI-S) N/A 10/12/2014   Procedure: PERCUTANEOUS CORONARY STENT INTERVENTION (PCI-S);  Surgeon: Leonie Man, MD;  Location: [facility] CATH LAB;  Ostial-Prox SVG-RCA Xience Alpine DES 3.0 mm x 38 mm; (3.4  - 3.3 mm)   RETINAL LASER PROCEDURE Bilateral    "more than once"    TONSILLECTOMY  as child   TRANSTHORACIC ECHOCARDIOGRAM  03/2017    EF 55-60%.  Normal wall thickness.  No regional wall motion normalities.  GR 2 DD.  Mild-moderate calcified aortic valve.  Mild RV systolic dysfunction.  Moderate RV dilation.  Moderately increased PA pressures.     Current Outpatient Medications  Medication Sig Dispense Refill   acetaminophen (TYLENOL) 325 MG tablet Take 650 mg by mouth daily as needed for mild pain or headache.      amLODipine (NORVASC) 10 MG tablet Take 1 tablet by mouth daily.      aspirin EC 81 MG tablet Take 1 tablet (81 mg total) by mouth every other day. (Patient taking differently: Take 81 mg by mouth See admin instructions. Take 81mg  once daily on Tues, Thurs, Sat, and Sun)     carvedilol (COREG) 12.5 MG tablet TAKE 1 TABLET BY MOUTH TWICE DAILY. 180 tablet 3   clopidogrel (PLAVIX) 75 MG tablet TAKE 1 TABLET DAILY WITH BREAKFAST. 90 tablet 3   Continuous Blood Gluc Sensor (FREESTYLE LIBRE 14 DAY SENSOR) MISC MONITOR BS CONTINUOUSLY  CHANGE Q 14 DAYS.     docusate sodium (STOOL SOFTENER) 100 MG capsule Take 100 mg by mouth daily as needed for mild constipation.      Evolocumab (REPATHA SURECLICK) 035 MG/ML SOAJ Inject 140 mg into the skin every 14 (fourteen) days. 2 pen 12   fenofibrate 160 MG tablet Take 1 tablet (160 mg total) by mouth daily. 90 tablet 2   furosemide (LASIX) 80 MG tablet Take 2 tablets (160 mg total) by mouth 2 (two) times daily.     HUMALOG KWIKPEN 200 UNIT/ML SOPN Inject 20-28 Units into the skin See admin instructions. Use 20 units (base) per meal increase units given by 1 unit for every 15 increment over 150 (I.E. Blood glucose reading 165=21 units, 180=22 units, etc. )  5   hydrALAZINE (APRESOLINE) 100 MG tablet TAKE 1 TABLET BY MOUTH 3 TIMES A DAY. 270 tablet 2   isosorbide mononitrate (IMDUR) 30 MG 24 hr tablet Take 1 tablet (30 mg total) by mouth every evening. This is in addition to 60 mg in the morning. 90 tablet 3   isosorbide mononitrate (IMDUR) 60 MG 24 hr tablet TAKE 1 TABLET ONCE DAILY. 135 tablet 1   ketoconazole (NIZORAL) 2 % cream As directed     pantoprazole (PROTONIX) 40 MG tablet TAKE 1 TABLET ONCE DAILY. 90 tablet 3   rosuvastatin (CRESTOR) 10 MG tablet TAKE 1 TABLET ON TUESDAY, THURSDAY, SATURDAY AND SUNDAY. 51 tablet 5   TOUJEO MAX SOLOSTAR 300 UNIT/ML SOPN Inject 66 Units into the skin 2 (two) times daily.   1   VASCEPA 1 G CAPS Take 2 g by mouth 2 (two) times daily.   4   No current facility-administered  medications for this visit.    Allergies:   Atorvastatin, Ibuprofen, Pravastatin, and Simvastatin    Social History:  The patient  reports that he quit smoking about 22 years ago. His smoking use included cigarettes. He has a 54.00 pack-year smoking history. He has never used smokeless  tobacco. He reports current alcohol use. He reports that he does not use drugs.   Family History:  The patient's family history includes Breast cancer in his mother and sister; Colon polyps in his father; Hyperlipidemia in his father; Hypertension in his father; Liver cancer in his maternal grandmother; Lung cancer in his sister; Throat cancer in his father.    ROS: All other systems are reviewed and negative. Unless otherwise mentioned in H&P    PHYSICAL EXAM: VS:  There were no vitals taken for this visit. , BMI There is no height or weight on file to calculate BMI. GEN: Well nourished, well developed, in no acute distress HEENT: normal Neck: no JVD, carotid bruits, or masses Cardiac: RRR, bradycardic; 1/6 systolic murmurs, rubs, or gallops, 1+ to 2+ dependent pitting edema in the ankles, left greater than right.  Respiratory:  Clear to auscultation bilaterally, normal work of breathing GI: soft, nontender, mild distention without ballottement, + BS MS: no deformity or atrophy Skin: warm and dry, no rash Neuro:  Strength and sensation are intact Psych: euthymic mood, full affect   EKG: Sinus bradycardia heart rate of 47 bpm with first-degree AV block PR interval 24 ms, with prolonged QT of 574.  Recent Labs: 07/21/2019: BNP 450.2 08/10/2019: ALT 31; BUN 52; Creatinine, Ser 3.19; Hemoglobin 9.9; Platelets 265; Potassium 4.6; Sodium 135    Lipid Panel    Component Value Date/Time   CHOL 150 12/02/2018 0948   TRIG 341 (H) 12/02/2018 0948   HDL 23 (L) 12/02/2018 0948   CHOLHDL 6.5 (H) 12/02/2018 0948   CHOLHDL 8.8 09/20/2014 0838   VLDL 73 (H) 09/20/2014 0838   LDLCALC 59 12/02/2018 0948       Wt Readings from Last 3 Encounters:  09/12/19 215 lb (97.5 kg)  08/10/19 221 lb (100.2 kg)  07/21/19 228 lb 3.2 oz (103.5 kg)      Other studies Reviewed: Echocardiogram 08/09/19 1. Left ventricular ejection fraction, by estimation, is 65 to 70%. The  left ventricle has normal function. The left ventrical has no regional  wall motion abnormalities. There is mildly increased left ventricular  hypertrophy. Left ventricular diastolic  parameters are indeterminate.  2. Right ventricular systolic function is normal. The right ventricular  size is mildly enlarged. There is moderately elevated pulmonary artery  systolic pressure.  3. Left atrial size was mild to moderately dilated.  4. Right atrial size was moderately dilated.  5. No evidence of mitral valve regurgitation.  6. The aortic valve is tricuspid. Aortic valve regurgitation is not  visualized. Mild aortic valve sclerosis is present, with no evidence of  aortic valve stenosis.  7. The inferior vena cava is normal in size with greater than 50%  respiratory variability, suggesting right atrial pressure of 3 mmHg.   ASSESSMENT AND PLAN:  1.  Chronic diastolic CHF: He has lost approximately 6-1/2 pounds with usual 1 dose of metolazone and temporary increase of dose of Lasix.  He is having some muscle cramps.  I am concerned him on some potassium which he will take 40 mEq x 1 today and then 20 mEq daily thereafter.  I am going to check a BMET today and a BN P.  We may need to consider changing to torsemide from Lasix as he is on such high doses for better bioavailability.  Go to see him in a month to see how he is doing at which time they may need to consider switching diuretics.  I have asked him  to get a scale because he is not weighing himself daily as directed.  He goes by the swelling and his breathing to tell if he has had fluid overload.  This is concerning as we can be forewarned with weight gain prior to his symptoms  and I have explained this to him.  2.  Bradycardia: Heart rate is much lower today than I have seen it.  He also has a first-degree AV block which is unchanged from prior EKGs.  He is on carvedilol.  Will need to we will monitor this more closely.  3.  Hypertension: He is on amlodipine 10 mg daily.  He does have some dependent edema associated with this calcium channel blocker.  May need to consider changing to ARB or ACE to avoid fluid retention in the dependent position.  I am checking labs today to evaluate his kidney function before deciding to change medications.  4.  CAD: History of DES to the RCA in 2012, CABG in 2015 with SVG to RCA, LIMA to LAD and SVG to circumflex.  The patient did have a drug-eluting stent and PCI to the SVG to RCA, in 2016.  He continues on aspirin and Plavix.  He denies chest pain or any further dyspnea on exertion as he has had improvement with diuresis.  No planned ischemic testing at this time.  5.  Hyperlipidemia: Continues on Repatha as he is not able to take statins due to allergies.  Will need to check fasting lipids and LFTs on follow-up visit.  Goal of LDL less than 70    Current medicines are reviewed at length with the patient today.  I have spent 30 minutes dedicated to the care of this patient on the date of this encounter to include pre-visit review of records, assessment, management and diagnostic testing,with shared decision making.  Labs/ tests ordered today include: BMET, BNP  Phill Myron. [place] Pugh, ANP, AACC   09/13/2019 1:39 PM    [facility] [place] [address] Office [phone] Fax [phone]  Notice: This dictation was prepared with Dragon dictation along with smaller phrase technology. Any transcriptional errors that result from this process are unintentional and may not be corrected upon review.

## 2019-09-14 ENCOUNTER — Encounter: Payer: Self-pay | Admitting: Adult Health

## 2019-09-14 ENCOUNTER — Other Ambulatory Visit: Payer: Self-pay

## 2019-09-14 ENCOUNTER — Ambulatory Visit (INDEPENDENT_AMBULATORY_CARE_PROVIDER_SITE_OTHER): Payer: Medicare Other | Admitting: Adult Health

## 2019-09-14 VITALS — BP 122/58 | HR 47 | Ht 68.0 in | Wt 215.2 lb

## 2019-09-14 DIAGNOSIS — E785 Hyperlipidemia, unspecified: Secondary | ICD-10-CM

## 2019-09-14 DIAGNOSIS — Z9861 Coronary angioplasty status: Secondary | ICD-10-CM | POA: Diagnosis not present

## 2019-09-14 DIAGNOSIS — I5032 Chronic diastolic (congestive) heart failure: Secondary | ICD-10-CM | POA: Diagnosis not present

## 2019-09-14 DIAGNOSIS — I2510 Atherosclerotic heart disease of native coronary artery without angina pectoris: Secondary | ICD-10-CM

## 2019-09-14 DIAGNOSIS — R0602 Shortness of breath: Secondary | ICD-10-CM

## 2019-09-14 DIAGNOSIS — I10 Essential (primary) hypertension: Secondary | ICD-10-CM | POA: Diagnosis not present

## 2019-09-14 MED ORDER — POTASSIUM CHLORIDE CRYS ER 20 MEQ PO TBCR: 20.0000 meq | EXTENDED_RELEASE_TABLET | Freq: Every day | ORAL | 3 refills | Status: DC

## 2019-09-14 NOTE — Patient Instructions (Signed)
Medication Instructions:  START- Potassium 20 meq, take 2 tablets(40 meq) today, then take 20 meq daily  *If you need a refill on your cardiac medications before your next appointment, please call your pharmacy*   Lab Work: BMP and BNP  If you have labs (blood work) drawn today and your tests are completely normal, you will receive your results only by: Marland Kitchen MyChart Message (if you have MyChart) OR . A paper copy in the mail If you have any lab test that is abnormal or we need to change your treatment, we will call you to review the results.   Testing/Procedures: None Ordered   Follow-Up: At [facility], you and your health needs are our priority.  As part of our continuing mission to provide you with exceptional heart care, we have created designated Provider Care Teams.  These Care Teams include your primary Cardiologist (physician) and Advanced Practice Providers (APPs -  Physician Assistants and Nurse Practitioners) who all work together to provide you with the care you need, when you need it.  We recommend signing up for the patient portal called "MyChart".  Sign up information is provided on this After Visit Summary.  MyChart is used to connect with patients for Virtual Visits (Telemedicine).  Patients are able to view lab/test results, encounter notes, upcoming appointments, etc.  Non-urgent messages can be sent to your provider as well.   To learn more about what you can do with MyChart, go to [email or web address].    Your next appointment:   1 month(s)  The format for your next appointment:   In Person  Provider:   Jory Sims, DNP, ANP

## 2019-09-15 ENCOUNTER — Other Ambulatory Visit: Payer: Self-pay

## 2019-09-15 DIAGNOSIS — D631 Anemia in chronic kidney disease: Secondary | ICD-10-CM | POA: Diagnosis not present

## 2019-09-15 LAB — BASIC METABOLIC PANEL
BUN/Creatinine Ratio: 18 (ref 10–24)
BUN: 61 mg/dL — ABNORMAL HIGH (ref 8–27)
CO2: 26 mmol/L (ref 20–29)
Calcium: 9.1 mg/dL (ref 8.6–10.2)
Chloride: 92 mmol/L — ABNORMAL LOW (ref 96–106)
Creatinine, Ser: 3.39 mg/dL — ABNORMAL HIGH (ref 0.76–1.27)
GFR calc Af Amer: 19 mL/min/{1.73_m2} — ABNORMAL LOW (ref >59)
GFR calc non Af Amer: 17 mL/min/{1.73_m2} — ABNORMAL LOW (ref >59)
Glucose: 196 mg/dL — ABNORMAL HIGH (ref 65–99)
Potassium: 5 mmol/L (ref 3.5–5.2)
Sodium: 136 mmol/L (ref 134–144)

## 2019-09-15 LAB — BRAIN NATRIURETIC PEPTIDE: BNP: 185.3 pg/mL — ABNORMAL HIGH (ref 0.0–100.0)

## 2019-09-15 MED ORDER — FUROSEMIDE 80 MG PO TABS: ORAL_TABLET | ORAL | Status: DC

## 2019-09-15 NOTE — Progress Notes (Signed)
Lasix dosage adjusted.    Lendon Colonel, NP  09/15/2019 7:36 AM EDT    Kidney function as worsened with the high doses of lasix. Back off on the lasix to 120 mg (1 1/2 tablet of the 80 mg) in the am, and 80 mg in the pm.

## 2019-09-19 ENCOUNTER — Telehealth: Payer: Self-pay | Admitting: *Deleted

## 2019-09-19 DIAGNOSIS — Z79899 Other long term (current) drug therapy: Secondary | ICD-10-CM

## 2019-09-19 NOTE — Telephone Encounter (Signed)
Spoke with pt about medication recommendation, pt advised to stop potassium, pt voice understanding and thanks.Marland Kitchen

## 2019-09-19 NOTE — Telephone Encounter (Signed)
-----   Message from Lendon Colonel, NP sent at 09/17/2019  6:47 PM EDT ----- Regarding: Potassium Please have patient stop potassium as we have decreased is lasix dose. Potassium is slightly elevated with the creatinine.  We decreased his lasix on Friday. Pleas have him come in for BMET today if possible.   KL

## 2019-09-21 DIAGNOSIS — Z79899 Other long term (current) drug therapy: Secondary | ICD-10-CM | POA: Diagnosis not present

## 2019-09-21 LAB — BASIC METABOLIC PANEL
BUN/Creatinine Ratio: 17 (ref 10–24)
BUN: 56 mg/dL — ABNORMAL HIGH (ref 8–27)
CO2: 24 mmol/L (ref 20–29)
Calcium: 9.4 mg/dL (ref 8.6–10.2)
Chloride: 98 mmol/L (ref 96–106)
Creatinine, Ser: 3.23 mg/dL — ABNORMAL HIGH (ref 0.76–1.27)
GFR calc Af Amer: 21 mL/min/{1.73_m2} — ABNORMAL LOW (ref >59)
GFR calc non Af Amer: 18 mL/min/{1.73_m2} — ABNORMAL LOW (ref >59)
Glucose: 123 mg/dL — ABNORMAL HIGH (ref 65–99)
Potassium: 4.6 mmol/L (ref 3.5–5.2)
Sodium: 139 mmol/L (ref 134–144)

## 2019-10-03 ENCOUNTER — Other Ambulatory Visit: Payer: Self-pay | Admitting: Cardiology

## 2019-10-03 ENCOUNTER — Telehealth (INDEPENDENT_AMBULATORY_CARE_PROVIDER_SITE_OTHER): Payer: Self-pay

## 2019-10-03 NOTE — Telephone Encounter (Signed)
Pt states vision is no better but no worse.  Next appt 10/31/19.  He wants to know if he needs to be seen sooner.  Pt is very worried about his vision.

## 2019-10-04 ENCOUNTER — Encounter (INDEPENDENT_AMBULATORY_CARE_PROVIDER_SITE_OTHER): Payer: Medicare Other | Admitting: Ophthalmology

## 2019-10-08 NOTE — Telephone Encounter (Signed)
Patient contacted Friday October 07, 2019 to return to office in 3 days for examination

## 2019-10-10 ENCOUNTER — Other Ambulatory Visit: Payer: Self-pay

## 2019-10-10 ENCOUNTER — Encounter (INDEPENDENT_AMBULATORY_CARE_PROVIDER_SITE_OTHER): Payer: Self-pay | Admitting: Ophthalmology

## 2019-10-10 ENCOUNTER — Ambulatory Visit (INDEPENDENT_AMBULATORY_CARE_PROVIDER_SITE_OTHER): Payer: Medicare Other | Admitting: Ophthalmology

## 2019-10-10 DIAGNOSIS — E113551 Type 2 diabetes mellitus with stable proliferative diabetic retinopathy, right eye: Secondary | ICD-10-CM

## 2019-10-10 DIAGNOSIS — E113553 Type 2 diabetes mellitus with stable proliferative diabetic retinopathy, bilateral: Secondary | ICD-10-CM

## 2019-10-10 DIAGNOSIS — Z794 Long term (current) use of insulin: Secondary | ICD-10-CM

## 2019-10-10 DIAGNOSIS — H4312 Vitreous hemorrhage, left eye: Secondary | ICD-10-CM | POA: Insufficient documentation

## 2019-10-10 DIAGNOSIS — E113592 Type 2 diabetes mellitus with proliferative diabetic retinopathy without macular edema, left eye: Secondary | ICD-10-CM | POA: Diagnosis not present

## 2019-10-10 DIAGNOSIS — E1139 Type 2 diabetes mellitus with other diabetic ophthalmic complication: Secondary | ICD-10-CM

## 2019-10-10 DIAGNOSIS — E113512 Type 2 diabetes mellitus with proliferative diabetic retinopathy with macular edema, left eye: Secondary | ICD-10-CM

## 2019-10-10 MED ORDER — BEVACIZUMAB CHEMO INJECTION 1.25MG/0.05ML SYRINGE FOR KALEIDOSCOPE
1.2500 mg | INTRAVITREAL | Status: AC | PRN
  Administered 2019-10-10: 11:00:00 1.25 mg via INTRAVITREAL

## 2019-10-10 NOTE — Progress Notes (Signed)
10/10/2019     CHIEF COMPLAINT Patient presents for Diabetic Eye Exam   HISTORY OF PRESENT ILLNESS: Lance Collier is a 74 y.o. male who presents to the clinic today for:   HPI    Diabetic Eye Exam    Vision is blurred for distance and is blurred for near.  Associated Symptoms Negative for Flashes and Floaters.  Diabetes characteristics include Type 2.  This started 4 weeks ago.  Last Blood Glucose 106.  Last A1C 7.          Comments    Patient states that his vision is still blurry in OS since last visit. Patient states that he could not see prior to his previous injection and it has not gotten any better over the past 4 weeks. LBS 106 this AM A1C 7.0 08/2019       Last edited by Gerda Diss on 10/10/2019  9:51 AM. (History)      Referring physician: Reynold Bowen, MD [address] [place],  [place] [address]  HISTORICAL INFORMATION:   Selected notes from the MEDICAL RECORD NUMBER    Lab Results  Component Value Date   HGBA1C 7.3 (H) 02/16/2018     CURRENT MEDICATIONS: No current outpatient medications on file. (Ophthalmic Drugs)   No current facility-administered medications for this visit. (Ophthalmic Drugs)   Current Outpatient Medications (Other)  Medication Sig  . acetaminophen (TYLENOL) 325 MG tablet Take 650 mg by mouth daily as needed for mild pain or headache.   Marland Kitchen amLODipine (NORVASC) 10 MG tablet Take 1 tablet by mouth daily.  Marland Kitchen aspirin EC 81 MG tablet Take 1 tablet (81 mg total) by mouth every other day. (Patient taking differently: Take 81 mg by mouth See admin instructions. Take 81mg  once daily on Tues, Thurs, Sat, and Sun)  . carvedilol (COREG) 12.5 MG tablet TAKE 1 TABLET BY MOUTH TWICE DAILY.  Marland Kitchen clopidogrel (PLAVIX) 75 MG tablet TAKE 1 TABLET DAILY WITH BREAKFAST.  Marland Kitchen Continuous Blood Gluc Sensor (FREESTYLE LIBRE 14 DAY SENSOR) MISC MONITOR BS CONTINUOUSLY  CHANGE Q 14 DAYS.  Marland Kitchen docusate sodium (STOOL SOFTENER) 100 MG capsule Take 100 mg by  mouth daily as needed for mild constipation.   . Evolocumab (REPATHA SURECLICK) 741 MG/ML SOAJ Inject 140 mg into the skin every 14 (fourteen) days.  . fenofibrate 160 MG tablet Take 1 tablet (160 mg total) by mouth daily.  . furosemide (LASIX) 80 MG tablet Take 120mg  (1.5tablets) in the AM and 80mg  (1tablet) in the PM.  . HUMALOG KWIKPEN 200 UNIT/ML SOPN Inject 20-28 Units into the skin See admin instructions. Use 20 units (base) per meal increase units given by 1 unit for every 15 increment over 150 (I.E. Blood glucose reading 165=21 units, 180=22 units, etc. )  . hydrALAZINE (APRESOLINE) 100 MG tablet TAKE 1 TABLET BY MOUTH 3 TIMES A DAY.  . isosorbide mononitrate (IMDUR) 60 MG 24 hr tablet TAKE 1 TABLET ONCE DAILY.  Marland Kitchen ketoconazole (NIZORAL) 2 % cream As directed  . pantoprazole (PROTONIX) 40 MG tablet TAKE 1 TABLET ONCE DAILY.  Marland Kitchen potassium chloride SA (KLOR-CON M20) 20 MEQ tablet Take 1 tablet (20 mEq total) by mouth daily.  . rosuvastatin (CRESTOR) 10 MG tablet TAKE 1 TABLET ON TUESDAY, THURSDAY, SATURDAY AND SUNDAY.  Marland Kitchen TOUJEO MAX SOLOSTAR 300 UNIT/ML SOPN Inject 66 Units into the skin 2 (two) times daily.   Marland Kitchen VASCEPA 1 G CAPS Take 2 g by mouth 2 (two) times daily.   Marland Kitchen  isosorbide mononitrate (IMDUR) 30 MG 24 hr tablet Take 1 tablet (30 mg total) by mouth every evening. This is in addition to 60 mg in the morning.   No current facility-administered medications for this visit. (Other)      REVIEW OF SYSTEMS:    ALLERGIES Allergies  Allergen Reactions  . Atorvastatin Other (See Comments)    Leg pain  . Ibuprofen Hives  . Pravastatin Other (See Comments)    Leg pain  . Simvastatin Other (See Comments)    Leg pain    PAST MEDICAL HISTORY Past Medical History:  Diagnosis Date  . Anemia   . Aortic sclerosis  - without Stenosis  March 2014   Echo: Aortic sclerosis without stenosis. EF 55-60% with normal WM; mild concentric hypertrophy with normal relaxation.  . Arthritis   .  CAD (coronary artery disease), autologous vein bypass graft 09/2014   10/03/14: Cath for Class III-IV Angina -- 80% ostial-proximal SVG-RPDA (minimal flow down native PDA from native RCA with 50% proximal and distal, patent LIMA-LAD, SVG-OM with retrograde filling of OM 2. --> 10/12/14: Staged PCI of SVG-RPDA - Xience DES  3.0 mm x 38 mm; (3.4  - 3.3 mm)  . CAD S/P percutaneous coronary angioplasty 06/12/2011; 09/2104   a) 2012: Complex PCI mRCA ([place]) --  2 overlapping Promus element DES stents.; 09/2014:  ost-prox SVG-rPDA - Xience DES 3.0 mm x 38 mm; (3.4  - 3.3 mm)  . CHF (congestive heart failure) ([place])   . Childhood asthma   . CKD (chronic kidney disease), stage III    "mild; related to diabetes" (10/02/2014)  . Complication of anesthesia    "I've had name recall recognition problems since my last anesthesia"  . DM (diabetes mellitus) type II uncontrolled with eye manifestation ([place]) 2013   Diabetic retinopathy with retinal hemorrhages since;, recurrent in August 2014 treated with Avastin shots  . Dyspnea    mainly with exertion  . GERD (gastroesophageal reflux disease)   . Heart murmur    questionable  . High cholesterol   . Hypertension   . Migraine ~ 2004   "once"  . PAD (peripheral artery disease) (HCC)    with claudication  . PONV (postoperative nausea and vomiting)    s/p tonsillectomy  . 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 07/01/2013 (Cath for [place] Unstable Angina) --> [name]: For LM disease; LIMA-LAD, SVG-RCA, SCG-Cx; b. 09/2014 Cath/Staged PCI: patent LIMA->LAD, VG->OM1/RI, VG->RCA 80% (3.0x38 Xience Alpine DES on 10/12/2014).  . 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  Likely thromboembolic event with PDA occlusion. 06/12/2011   Post Complex PCI, pt states no mi  . Wears glasses    reading   Past Surgical History:  Procedure Laterality Date  . ABDOMINAL AORTOGRAM W/LOWER EXTREMITY N/A 02/05/2018   Procedure: ABDOMINAL  AORTOGRAM W/LOWER EXTREMITY;  Surgeon: Elam Dutch, MD;  Location: [facility];  Service: Cardiovascular;  Laterality: N/A;  . COLONOSCOPY    . CORONARY ARTERY BYPASS GRAFT N/A 07/01/2013   Procedure: CORONARY ARTERY BYPASS GRAFTING (CABG);  Surgeon: Grace Isaac, MD;  Location: [facility];  Service: Open Heart Surgery;  Laterality: N/A;  Times 3 using left internal mammary artery and endoscopically harvested bilateral saphenous vein  . ENDARTERECTOMY Left 05/21/2016   Procedure: LEFT CAROTID ENDARTERECTOMY;  Surgeon: Elam Dutch, MD;  Location: [facility];  Service: Vascular;  Laterality: Left;  . ENDARTERECTOMY FEMORAL Left 02/23/2018  Procedure: LEFT COMMON FEMORAL ENDARTERECTOMY;  Surgeon: Elam Dutch, MD;  Location: [facility];  Service: Vascular;  Laterality: Left;  . EYE SURGERY Bilateral    ioc for cataracts  . INSERTION OF ILIAC STENT Bilateral 02/23/2018   Procedure: INSERTION OF BILATERAL COMMON ILIAC STENTS WITH VIABAHN STENTS;  Surgeon: Elam Dutch, MD;  Location: [facility];  Service: Vascular;  Laterality: Bilateral;  . INTRAOPERATIVE TRANSESOPHAGEAL ECHOCARDIOGRAM N/A 07/01/2013   Procedure: INTRAOPERATIVE TRANSESOPHAGEAL ECHOCARDIOGRAM;  Surgeon: Grace Isaac, MD;  Location: [facility];  Service: Open Heart Surgery;  Laterality: N/A;  . LEFT AND RIGHT HEART CATHETERIZATION WITH CORONARY/GRAFT ANGIOGRAM N/A 10/03/2014   Procedure: LEFT AND RIGHT HEART CATHETERIZATION WITH Beatrix Fetters;  Surgeon: Leonie Man, MD;  Location: [facility] CATH LAB;  Service: Cardiovascular; severe prox RCA 80% (dRCA w/ competetive flow & 60% hazy ISR in Native RCA), patent LIMA-LAD  & SVG-OM1-RI - retrograde fills OM2, known native LM & mod OM2.  Severe Systemic HTN - LVEDP/PCWP 22 & 28 mmHg, Normal CO/CI  . LEFT HEART CATHETERIZATION WITH CORONARY ANGIOGRAM N/A 06/11/2011   Procedure: LEFT HEART CATHETERIZATION WITH CORONARY ANGIOGRAM;  Surgeon: Fulton Reek, MD;  Location: [facility] CATH  LAB;;  Dense LM & prox LAD, prox RCA calcification. Large bifurcating OM1 & small OM2 normal. Minor LAD & D1 dz, tortuous prox-mid RCA w/ 60% &  subtotal occluded dRCA w/ normal PDA & PLV   . LEFT HEART CATHETERIZATION WITH CORONARY ANGIOGRAM N/A 06/28/2013   Procedure: LEFT HEART CATHETERIZATION WITH CORONARY ANGIOGRAM;  Surgeon: Leonie Man, MD;  Location: [facility] CATH LAB;  Service: Cardiovascular;  ostial LM disese, RCA ~40-50%ISR  . NM MYOVIEW LTD  04/29/2018   LEXISCAN: Non-gated study with small fixed moderate intensity defect in the apical inferior wall suspicious for either small area of infarction versus attenuation (no change from previous study).  LOW RISK.   Marland Kitchen PATCH ANGIOPLASTY Left 05/21/2016   Procedure: PATCH ANGIOPLASTY USING HEMASHIELD PLATINUM FINESSE 0.3in x 3 in;  Surgeon: Elam Dutch, MD;  Location: [facility];  Service: Vascular;  Laterality: Left;  . PATCH ANGIOPLASTY Left 02/23/2018   Procedure: PATCH ANGIOPLASTY LEFT COMMON FEMORAL ARTERY;  Surgeon: Elam Dutch, MD;  Location: [facility];  Service: Vascular;  Laterality: Left;  . PERCUTANEOUS CORONARY STENT INTERVENTION (PCI-S)  06/11/2011   Procedure: PERCUTANEOUS CORONARY STENT INTERVENTION (PCI-S);  Surgeon: Fulton Reek, MD;  Location: [facility] CATH LAB;  Service: Cardiovascular;;2 overlapping Promus DES 2.6mm at 12 mm x2; postdilated to 3 mm  . PERCUTANEOUS CORONARY STENT INTERVENTION (PCI-S) N/A 10/12/2014   Procedure: PERCUTANEOUS CORONARY STENT INTERVENTION (PCI-S);  Surgeon: Leonie Man, MD;  Location: [facility] CATH LAB;  Ostial-Prox SVG-RCA Xience Alpine DES 3.0 mm x 38 mm; (3.4  - 3.3 mm)  . RETINAL LASER PROCEDURE Bilateral    "more than once"   . TONSILLECTOMY  as child  . TRANSTHORACIC ECHOCARDIOGRAM  03/2017    EF 55-60%.  Normal wall thickness.  No regional wall motion normalities.  GR 2 DD.  Mild-moderate calcified aortic valve.  Mild RV systolic dysfunction.  Moderate RV dilation.  Moderately increased PA  pressures.    FAMILY HISTORY Family History  Problem Relation Age of Onset  . Lung cancer Sister   . Breast cancer Sister   . Breast cancer Mother   . Hyperlipidemia Father   . Hypertension Father   . Throat cancer Father   . Colon polyps Father   . Liver cancer  Maternal Grandmother   . Stomach cancer Neg Hx   . Rectal cancer Neg Hx   . Colon cancer Neg Hx   . Esophageal cancer Neg Hx     SOCIAL HISTORY Social History   Tobacco Use  . Smoking status: Former Smoker    Packs/day: 2.00    Years: 27.00    Pack years: 54.00    Types: Cigarettes    Quit date: 11/23/1996    Years since quitting: 22.8  . Smokeless tobacco: Never Used  Substance Use Topics  . Alcohol use: Yes    Comment: rarely  . Drug use: No         OPHTHALMIC EXAM:  Base Eye Exam    Visual Acuity (Snellen - Linear)      Right Left   Dist [place] 20/25-2 HM   Dist ph [place]  NI       Tonometry (Tonopen, 9:57 AM)      Right Left   Pressure 11 9       Pupils      Pupils Dark Light Shape React APD   Right PERRL 3 2 Round Slow None   Left PERRL 3 2 Round Slow None       Visual Fields (Counting fingers)      Left Right     Full   Restrictions Total superior temporal, inferior temporal, superior nasal, inferior nasal deficiencies        Extraocular Movement      Right Left    Full Full       Neuro/Psych    Oriented x3: Yes   Mood/Affect: Normal       Dilation    Left eye: 1.0% Mydriacyl, 2.5% Phenylephrine @ 9:57 AM        Slit Lamp and Fundus Exam    External Exam      Right Left   External Normal Normal       Slit Lamp Exam      Right Left   Lids/Lashes Normal Normal   Conjunctiva/Sclera White and quiet White and quiet   Cornea Clear Clear   Anterior Chamber Deep and quiet Deep and quiet   Iris Round and reactive Round and reactive,,, no in VI   Lens Posterior chamber intraocular lens Posterior chamber intraocular lens   Anterior Vitreous Normal Normal       Fundus Exam       Right Left   Posterior Vitreous  Hemorrhage, 3+ vitreous hemorrhage   C/D Ratio  No view posteriorly   Vessels  No view posteriorly, red reflex          IMAGING AND PROCEDURES  Imaging and Procedures for 10/10/19  OCT, Retina - OU - Both Eyes       Right Eye Quality was good. Scan locations included subfoveal. Findings include normal observations, normal foveal contour, vitreomacular adhesion .   Notes OD, no active maculopathy, OS no view       B-Scan Ultrasound - OS - Left Eye       Quality was good.   Notes B-scan, dynamic and static of the left eye discloses no evidence of retinal detachment, no retinal tear and only moderate vitreous hemorrhage left eye       Intravitreal Injection, Pharmacologic Agent - OS - Left Eye       Time Out 10/10/2019. 11:07 AM. Confirmed correct patient, procedure, site, and patient consented.   Anesthesia Topical anesthesia was used. Anesthetic medications included [place] 3.5%.  Procedure Preparation included Ofloxacin , 5% betadine to ocular surface, 10% betadine to eyelids. A 30 gauge needle was used.   Injection:  1.25 mg Bevacizumab (AVASTIN) SOLN   NDC: [address]-2, Lot: [id number]   Route: Intravitreal, Site: Left Eye, Waste: 0 mg  Post-op Post injection exam found visual acuity of at least counting fingers. The patient tolerated the procedure well. There were no complications. The patient received written and verbal post procedure care education. Post injection medications were not given.                 ASSESSMENT/PLAN:  No problem-specific Assessment & Plan notes found for this encounter.      ICD-10-CM   1. Vitreous hemorrhage of left eye (HCC)  H43.12 B-Scan Ultrasound - OS - Left Eye  2. Controlled type 2 diabetes mellitus with stable proliferative retinopathy of both eyes, with long-term current use of insulin (HCC)  E11.3553 OCT, Retina - OU - Both Eyes   Z79.4   3. 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 Proliferative diabetic retinopathy of left eye with macular edema associated with type 2 diabetes mellitus (HCC)  E72.0947 OCT, Retina - OU - Both Eyes    Intravitreal Injection, Pharmacologic Agent - OS - Left Eye    Bevacizumab (AVASTIN) SOLN 1.25 mg  5. Stable treated proliferative diabetic retinopathy of right eye determined by examination associated with type 2 diabetes mellitus ([place])  E11.3551   6. Proliferative diabetic retinopathy of left eye without macular edema associated with type 2 diabetes mellitus ([place])  S96.2836     1.  2.  3.  Ophthalmic Meds Ordered this visit:  Meds ordered this encounter  Medications  . Bevacizumab (AVASTIN) SOLN 1.25 mg       Return in about 5 weeks (around 11/14/2019) for AVASTIN OCT, OS.  There are no Patient Instructions on file for this visit.   Explained the diagnoses, plan, and follow up with the patient and they expressed understanding.  Patient expressed understanding of the importance of proper follow up care.   Clent Demark Jaisen Wiltrout M.D. Diseases & Surgery of the Retina and Vitreous Retina & Diabetic [place] 10/10/19     Abbreviations: M myopia (nearsighted); A astigmatism; H hyperopia (farsighted); P presbyopia; Mrx spectacle prescription;  CTL contact lenses; OD right eye; OS left eye; OU both eyes  XT exotropia; ET esotropia; PEK punctate epithelial keratitis; PEE punctate epithelial erosions; DES dry eye syndrome; MGD meibomian gland dysfunction; ATs artificial tears; PFAT's preservative free artificial tears; [place] nuclear sclerotic cataract; PSC posterior subcapsular cataract; ERM epi-retinal membrane; PVD posterior vitreous detachment; RD retinal detachment; DM diabetes mellitus; DR diabetic retinopathy; NPDR non-proliferative diabetic retinopathy; PDR proliferative diabetic retinopathy; CSME clinically significant macular edema; DME diabetic macular edema; dbh dot blot hemorrhages; CWS cotton wool spot; POAG primary open angle  glaucoma; C/D cup-to-disc ratio; HVF humphrey visual field; GVF goldmann visual field; OCT optical coherence tomography; IOP intraocular pressure; BRVO Branch retinal vein occlusion; CRVO central retinal vein occlusion; CRAO central retinal artery occlusion; BRAO branch retinal artery occlusion; RT retinal tear; SB scleral buckle; PPV pars plana vitrectomy; VH Vitreous hemorrhage; PRP panretinal laser photocoagulation; IVK intravitreal kenalog; VMT vitreomacular traction; MH Macular hole;  NVD neovascularization of the disc; NVE neovascularization elsewhere; AREDS age related eye disease study; ARMD age related macular degeneration; POAG primary open angle glaucoma; EBMD epithelial/anterior basement membrane dystrophy; ACIOL anterior chamber intraocular lens; IOL intraocular lens; PCIOL posterior chamber intraocular lens; Phaco/IOL phacoemulsification with  intraocular lens placement; [place] photorefractive keratectomy; LASIK laser assisted in situ keratomileusis; HTN hypertension; DM diabetes mellitus; COPD chronic obstructive pulmonary disease

## 2019-10-15 NOTE — Progress Notes (Signed)
Cardiology Office Note   Date:  10/15/2019   ID:  Lance Collier, DOB 13-Mar-1946, MRN [id number]  PCP:  Reynold Bowen, MD  Cardiologist: Dr. Ellyn Hack No chief complaint on file.    History of Present Illness: Lance Collier is a 74 y.o. male who presents for ongoing assessment and management of CAD, 2 overlapping drug-eluting stents to the RCA in 2012, CABG in 2015 with LIMA to LAD, SVG to RCA, with SVG to circumflex.  April 2016 DES and PCI to SVG to RCA. Other history includes CKD Stage III,Type II diabetes, HTN, PAD, carotid disease and HL  Last seen in the office on 09/14/2019 on follow-up after increase in diuretics due to worsening lower extremity edema.  At that time he had lost 6-1/2 pounds.  He was placed back on his usual dose of Lasix 160 in the a.m. and 160 in the afternoon.  At that time he was having some muscle cramps.  He was given potassium 40 mEq x 1 day and then 20 mEq daily thereafter.  Follow-up BM ET and a BNP were ordered.  BMI 18 revealed a creatinine of 3.23, similar to creatinine on 08/10/2019 at 3.19, but better than prior creatinine of 3.39.  BNP was 185.3.   If he continued to have recurrent edema would have consideration to change him to torsemide from Lasix he was also to weigh himself every day and let us know what his weight recordings are on follow-up.  Consider also changing to ACE or ARB as opposed to amlodipine to avoid dependent edema if this continues to be an issue for him.  He was also noted to be bradycardic with a first-degree AV block.  This was unchanged from prior EKGs except for the rate.  He will need an EKG today for reevaluation.  He comes today without any cardiac complaints.  He is also being followed by [facility], Dr. Moshe Cipro, and was seen on 08/24/2019.  They did discuss dialysis but he had not decided that that was something he wanted to pursue.  He was aware of the consequences without hemodialysis.  She is due to follow-up with  him in months to continue ongoing discussion for same.  Blood pressure was well controlled, most recent hemoglobin was documented at 10.0.  He was recommended to have Ferriheme  transfusions x2, but he did not wish to go to [facility] for transfusion.  Due to the amount of time it takes to have this completed.  He was continued on Lasix 160 mg p.o. twice daily.  He states that he has been okay.  He has not been very active as his legs bother him so much as a result of his PAD.  He denies recurrent chest discomfort, he does have some mild dyspnea on exertion due to sedentary lifestyle.  He is medically compliant.  Offers no bleeding hemoptysis or excessive bruising on clopidogrel.  Past Medical History:  Diagnosis Date  . Anemia   . Aortic sclerosis  - without Stenosis  March 2014   Echo: Aortic sclerosis without stenosis. EF 55-60% with normal WM; mild concentric hypertrophy with normal relaxation.  . Arthritis   . CAD (coronary artery disease), autologous vein bypass graft 09/2014   10/03/14: Cath for Class III-IV Angina -- 80% ostial-proximal SVG-RPDA (minimal flow down native PDA from native RCA with 50% proximal and distal, patent LIMA-LAD, SVG-OM with retrograde filling of OM 2. --> 10/12/14: Staged PCI of SVG-RPDA - Xience DES  3.0 mm x  38 mm; (3.4  - 3.3 mm)  . CAD S/P percutaneous coronary angioplasty 06/12/2011; 09/2104   a) 2012: Complex PCI mRCA ([place]) --  2 overlapping Promus element DES stents.; 09/2014:  ost-prox SVG-rPDA - Xience DES 3.0 mm x 38 mm; (3.4  - 3.3 mm)  . CHF (congestive heart failure) ([place])   . Childhood asthma   . CKD (chronic kidney disease), stage III    "mild; related to diabetes" (10/02/2014)  . Complication of anesthesia    "I've had name recall recognition problems since my last anesthesia"  . DM (diabetes mellitus) type II uncontrolled with eye manifestation ([place]) 2013   Diabetic retinopathy with retinal hemorrhages since;, recurrent in August 2014 treated with  Avastin shots  . Dyspnea    mainly with exertion  . GERD (gastroesophageal reflux disease)   . Heart murmur    questionable  . High cholesterol   . Hypertension   . Migraine ~ 2004   "once"  . PAD (peripheral artery disease) ([facility])    with claudication  . PONV (postoperative nausea and vomiting)    s/p tonsillectomy  . 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 07/01/2013 (Cath for Crescendo Unstable Angina) --> [name]: For LM disease; LIMA-LAD, SVG-RCA, SCG-Cx; b. 09/2014 Cath/Staged PCI: patent LIMA->LAD, VG->OM1/RI, VG->RCA 80% (3.0x38 Xience Alpine DES on 10/12/2014).  . 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  Likely thromboembolic event with PDA occlusion. 06/12/2011   Post Complex PCI, pt states no mi  . Wears glasses    reading    Past Surgical History:  Procedure Laterality Date  . ABDOMINAL AORTOGRAM W/LOWER EXTREMITY N/A 02/05/2018   Procedure: ABDOMINAL AORTOGRAM W/LOWER EXTREMITY;  Surgeon: Elam Dutch, MD;  Location: [facility];  Service: Cardiovascular;  Laterality: N/A;  . COLONOSCOPY    . CORONARY ARTERY BYPASS GRAFT N/A 07/01/2013   Procedure: CORONARY ARTERY BYPASS GRAFTING (CABG);  Surgeon: Grace Isaac, MD;  Location: [place];  Service: Open Heart Surgery;  Laterality: N/A;  Times 3 using left internal mammary artery and endoscopically harvested bilateral saphenous vein  . ENDARTERECTOMY Left 05/21/2016   Procedure: LEFT CAROTID ENDARTERECTOMY;  Surgeon: Elam Dutch, MD;  Location: [place];  Service: Vascular;  Laterality: Left;  . ENDARTERECTOMY FEMORAL Left 02/23/2018   Procedure: LEFT COMMON FEMORAL ENDARTERECTOMY;  Surgeon: Elam Dutch, MD;  Location: [place];  Service: Vascular;  Laterality: Left;  . EYE SURGERY Bilateral    ioc for cataracts  . INSERTION OF ILIAC STENT Bilateral 02/23/2018   Procedure: INSERTION OF BILATERAL COMMON ILIAC STENTS WITH VIABAHN STENTS;  Surgeon: Elam Dutch, MD;   Location: [place];  Service: Vascular;  Laterality: Bilateral;  . INTRAOPERATIVE TRANSESOPHAGEAL ECHOCARDIOGRAM N/A 07/01/2013   Procedure: INTRAOPERATIVE TRANSESOPHAGEAL ECHOCARDIOGRAM;  Surgeon: Grace Isaac, MD;  Location: [place];  Service: Open Heart Surgery;  Laterality: N/A;  . LEFT AND RIGHT HEART CATHETERIZATION WITH CORONARY/GRAFT ANGIOGRAM N/A 10/03/2014   Procedure: LEFT AND RIGHT HEART CATHETERIZATION WITH Beatrix Fetters;  Surgeon: Leonie Man, MD;  Location: [facility] CATH LAB;  Service: Cardiovascular; severe prox RCA 80% (dRCA w/ competetive flow & 60% hazy ISR in Native RCA), patent LIMA-LAD  & SVG-OM1-RI - retrograde fills OM2, known native LM & mod OM2.  Severe Systemic HTN - LVEDP/PCWP 22 & 28 mmHg, Normal CO/CI  . LEFT HEART CATHETERIZATION WITH CORONARY ANGIOGRAM N/A 06/11/2011   Procedure: LEFT HEART CATHETERIZATION WITH CORONARY  ANGIOGRAM;  Surgeon: Fulton Reek, MD;  Location: [facility] CATH LAB;;  Dense LM & prox LAD, prox RCA calcification. Large bifurcating OM1 & small OM2 normal. Minor LAD & D1 dz, tortuous prox-mid RCA w/ 60% &  subtotal occluded dRCA w/ normal PDA & PLV   . LEFT HEART CATHETERIZATION WITH CORONARY ANGIOGRAM N/A 06/28/2013   Procedure: LEFT HEART CATHETERIZATION WITH CORONARY ANGIOGRAM;  Surgeon: Leonie Man, MD;  Location: [facility] CATH LAB;  Service: Cardiovascular;  ostial LM disese, RCA ~40-50%ISR  . NM MYOVIEW LTD  04/29/2018   LEXISCAN: Non-gated study with small fixed moderate intensity defect in the apical inferior wall suspicious for either small area of infarction versus attenuation (no change from previous study).  LOW RISK.   Marland Kitchen PATCH ANGIOPLASTY Left 05/21/2016   Procedure: PATCH ANGIOPLASTY USING HEMASHIELD PLATINUM FINESSE 0.3in x 3 in;  Surgeon: Elam Dutch, MD;  Location: [place];  Service: Vascular;  Laterality: Left;  . PATCH ANGIOPLASTY Left 02/23/2018   Procedure: PATCH ANGIOPLASTY LEFT COMMON FEMORAL ARTERY;  Surgeon: Elam Dutch,  MD;  Location: [place];  Service: Vascular;  Laterality: Left;  . PERCUTANEOUS CORONARY STENT INTERVENTION (PCI-S)  06/11/2011   Procedure: PERCUTANEOUS CORONARY STENT INTERVENTION (PCI-S);  Surgeon: Fulton Reek, MD;  Location: [facility] CATH LAB;  Service: Cardiovascular;;2 overlapping Promus DES 2.64mm at 12 mm x2; postdilated to 3 mm  . PERCUTANEOUS CORONARY STENT INTERVENTION (PCI-S) N/A 10/12/2014   Procedure: PERCUTANEOUS CORONARY STENT INTERVENTION (PCI-S);  Surgeon: Leonie Man, MD;  Location: [facility] CATH LAB;  Ostial-Prox SVG-RCA Xience Alpine DES 3.0 mm x 38 mm; (3.4  - 3.3 mm)  . RETINAL LASER PROCEDURE Bilateral    "more than once"   . TONSILLECTOMY  as child  . TRANSTHORACIC ECHOCARDIOGRAM  03/2017    EF 55-60%.  Normal wall thickness.  No regional wall motion normalities.  GR 2 DD.  Mild-moderate calcified aortic valve.  Mild RV systolic dysfunction.  Moderate RV dilation.  Moderately increased PA pressures.     Current Outpatient Medications  Medication Sig Dispense Refill  . acetaminophen (TYLENOL) 325 MG tablet Take 650 mg by mouth daily as needed for mild pain or headache.     Marland Kitchen amLODipine (NORVASC) 10 MG tablet Take 1 tablet by mouth daily.    Marland Kitchen aspirin EC 81 MG tablet Take 1 tablet (81 mg total) by mouth every other day. (Patient taking differently: Take 81 mg by mouth See admin instructions. Take 81mg  once daily on Tues, Thurs, Sat, and Sun)    . carvedilol (COREG) 12.5 MG tablet TAKE 1 TABLET BY MOUTH TWICE DAILY. 180 tablet 3  . clopidogrel (PLAVIX) 75 MG tablet TAKE 1 TABLET DAILY WITH BREAKFAST. 90 tablet 3  . Continuous Blood Gluc Sensor (FREESTYLE LIBRE 14 DAY SENSOR) MISC MONITOR BS CONTINUOUSLY  CHANGE Q 14 DAYS.    Marland Kitchen docusate sodium (STOOL SOFTENER) 100 MG capsule Take 100 mg by mouth daily as needed for mild constipation.     . Evolocumab (REPATHA SURECLICK) 952 MG/ML SOAJ Inject 140 mg into the skin every 14 (fourteen) days. 2 pen 12  . fenofibrate 160 MG tablet Take  1 tablet (160 mg total) by mouth daily. 90 tablet 2  . furosemide (LASIX) 80 MG tablet Take 120mg  (1.5tablets) in the AM and 80mg  (1tablet) in the PM.    . HUMALOG KWIKPEN 200 UNIT/ML SOPN Inject 20-28 Units into the skin See admin instructions. Use 20 units (base) per meal increase units  given by 1 unit for every 15 increment over 150 (I.E. Blood glucose reading 165=21 units, 180=22 units, etc. )  5  . hydrALAZINE (APRESOLINE) 100 MG tablet TAKE 1 TABLET BY MOUTH 3 TIMES A DAY. 270 tablet 2  . isosorbide mononitrate (IMDUR) 30 MG 24 hr tablet Take 1 tablet (30 mg total) by mouth every evening. This is in addition to 60 mg in the morning. 90 tablet 3  . isosorbide mononitrate (IMDUR) 60 MG 24 hr tablet TAKE 1 TABLET ONCE DAILY. 135 tablet 1  . ketoconazole (NIZORAL) 2 % cream As directed    . pantoprazole (PROTONIX) 40 MG tablet TAKE 1 TABLET ONCE DAILY. 90 tablet 3  . potassium chloride SA (KLOR-CON M20) 20 MEQ tablet Take 1 tablet (20 mEq total) by mouth daily. 90 tablet 3  . rosuvastatin (CRESTOR) 10 MG tablet TAKE 1 TABLET ON TUESDAY, THURSDAY, SATURDAY AND SUNDAY. 51 tablet 3  . TOUJEO MAX SOLOSTAR 300 UNIT/ML SOPN Inject 66 Units into the skin 2 (two) times daily.   1  . VASCEPA 1 G CAPS Take 2 g by mouth 2 (two) times daily.   4   No current facility-administered medications for this visit.    Allergies:   Atorvastatin, Ibuprofen, Pravastatin, and Simvastatin    Social History:  The patient  reports that he quit smoking about 22 years ago. His smoking use included cigarettes. He has a 54.00 pack-year smoking history. He has never used smokeless tobacco. He reports current alcohol use. He reports that he does not use drugs.   Family History:  The patient's family history includes Breast cancer in his mother and sister; Colon polyps in his father; Hyperlipidemia in his father; Hypertension in his father; Liver cancer in his maternal grandmother; Lung cancer in his sister; Throat cancer in  his father.    ROS: All other systems are reviewed and negative. Unless otherwise mentioned in H&P    PHYSICAL EXAM: VS:  There were no vitals taken for this visit. , BMI There is no height or weight on file to calculate BMI. GEN: Well nourished, well developed, in no acute distress HEENT: normal Neck: no JVD, carotid bruits, or masses Cardiac: RRR 2/6 systolic murmur heard best at the left sternal border with some radiation into the apex,, rubs, or gallops, mild dependent edema  Respiratory:  Clear to auscultation bilaterally, normal work of breathing GI: soft, nontender, nondistended, + BS, obese MS: no deformity or atrophy Skin: warm and dry, no rash Neuro:  Strength and sensation are intact Psych: euthymic mood, full affect   EKG: Sinus bradycardia first-degree AV block heart rate 57 bpm with possible left atrial enlargement and LVH.  (Unchanged from prior EKG on last office visit).  Recent Labs: 08/10/2019: ALT 31; Hemoglobin 9.9; Platelets 265 09/14/2019: BNP 185.3 09/21/2019: BUN 56; Creatinine, Ser 3.23; Potassium 4.6; Sodium 139    Lipid Panel    Component Value Date/Time   CHOL 150 12/02/2018 0948   TRIG 341 (H) 12/02/2018 0948   HDL 23 (L) 12/02/2018 0948   CHOLHDL 6.5 (H) 12/02/2018 0948   CHOLHDL 8.8 09/20/2014 0838   VLDL 73 (H) 09/20/2014 0838   LDLCALC 59 12/02/2018 0948      Wt Readings from Last 3 Encounters:  09/14/19 215 lb 3.2 oz (97.6 kg)  09/12/19 215 lb (97.5 kg)  08/10/19 221 lb (100.2 kg)      Other studies Reviewed: Echocardiogram Aug 14, 2019 1. Left ventricular ejection fraction, by estimation, is 65 to  70%. The  left ventricle has normal function. The left ventrical has no regional  wall motion abnormalities. There is mildly increased left ventricular  hypertrophy. Left ventricular diastolic  parameters are indeterminate.  2. Right ventricular systolic function is normal. The right ventricular  size is mildly enlarged. There is  moderately elevated pulmonary artery  systolic pressure.  3. Left atrial size was mild to moderately dilated.  4. Right atrial size was moderately dilated.  5. No evidence of mitral valve regurgitation.  6. The aortic valve is tricuspid. Aortic valve regurgitation is not  visualized. Mild aortic valve sclerosis is present, with no evidence of  aortic valve stenosis.  7. The inferior vena cava is normal in size with greater than 50%  respiratory variability, suggesting right atrial pressure of 3 mmHg.    ASSESSMENT AND PLAN:  1.  Hypertension: Currently well controlled on medication regimen.  He will remain on amlodipine, isosorbide mononitrate 60 mg once a day, with 30 mg in the evening, carvedilol 12.5 mg twice daily.  Labs are followed by nephrology.  2.  Chronic kidney disease stage IV: Followed by [facility] with Dr. Moshe Cipro.  Discussion continues to be had concerning hemodialysis.  The patient is not yet ready to proceed with this.  Defer to nephrology for ongoing recommendations and management.  3.  Hyperlipidemia: Remains on Repatha 140 mg every 2 weeks.  Will need follow-up fasting lipids and LFTs when seen again.  4.  Coronary artery disease: History of 2 overlapping drug-eluting stents to the RCA in 2012, history of CABG in 2015, with follow-up cath in 2016 with DES and PCI to the SVG to RCA.  He will continue on beta-blocker, aspirin, and Plavix.  Labs are followed by nephrology.  He is without cardiac complaints at this time.   Current medicines are reviewed at length with the patient today.  I have spent 25 minutes dedicated to the care of this patient on the date of this encounter to include pre-visit review of records, assessment, management and diagnostic testing,with shared decision making.  Labs/ tests ordered today include: None. Phill Myron. [place] Pugh, ANP, AACC   10/15/2019 4:35 PM    [facility] [address] Office [phone] Fax [phone]  Notice: This dictation was prepared with Dragon dictation along with smaller phrase technology. Any transcriptional errors that result from this process are unintentional and may not be corrected upon review.

## 2019-10-17 ENCOUNTER — Ambulatory Visit (INDEPENDENT_AMBULATORY_CARE_PROVIDER_SITE_OTHER): Payer: Medicare Other | Admitting: Adult Health

## 2019-10-17 ENCOUNTER — Other Ambulatory Visit: Payer: Self-pay

## 2019-10-17 ENCOUNTER — Encounter: Payer: Self-pay | Admitting: Adult Health

## 2019-10-17 VITALS — BP 136/58 | HR 68 | Ht 68.0 in | Wt 216.0 lb

## 2019-10-17 DIAGNOSIS — E785 Hyperlipidemia, unspecified: Secondary | ICD-10-CM | POA: Diagnosis not present

## 2019-10-17 DIAGNOSIS — N1832 Chronic kidney disease, stage 3b: Secondary | ICD-10-CM

## 2019-10-17 DIAGNOSIS — Z951 Presence of aortocoronary bypass graft: Secondary | ICD-10-CM

## 2019-10-17 DIAGNOSIS — I10 Essential (primary) hypertension: Secondary | ICD-10-CM

## 2019-10-17 DIAGNOSIS — I2510 Atherosclerotic heart disease of native coronary artery without angina pectoris: Secondary | ICD-10-CM

## 2019-10-17 DIAGNOSIS — Z9861 Coronary angioplasty status: Secondary | ICD-10-CM | POA: Diagnosis not present

## 2019-10-17 NOTE — Patient Instructions (Signed)
Medication Instructions:  Continue current medications  *If you need a refill on your cardiac medications before your next appointment, please call your pharmacy*   Lab Work: None ordered If you have labs (blood work) drawn today and your tests are completely normal, you will receive your results only by: Marland Kitchen MyChart Message (if you have MyChart) OR . A paper copy in the mail If you have any lab test that is abnormal or we need to change your treatment, we will call you to review the results.   Testing/Procedures: None Ordered   Follow-Up: At [facility], you and your health needs are our priority.  As part of our continuing mission to provide you with exceptional heart care, we have created designated Provider Care Teams.  These Care Teams include your primary Cardiologist (physician) and Advanced Practice Providers (APPs -  Physician Assistants and Nurse Practitioners) who all work together to provide you with the care you need, when you need it.  We recommend signing up for the patient portal called "MyChart".  Sign up information is provided on this After Visit Summary.  MyChart is used to connect with patients for Virtual Visits (Telemedicine).  Patients are able to view lab/test results, encounter notes, upcoming appointments, etc.  Non-urgent messages can be sent to your provider as well.   To learn more about what you can do with MyChart, go to [email or web address].    Your next appointment:   6 month(s)  The format for your next appointment:   In Person  Provider:   You may see Glenetta Hew, MD or one of the following Advanced Practice Providers on your designated Care Team:    Almyra Deforest, PA-C  Fabian Sharp, PA-C or   Roby Lofts, [place]

## 2019-10-25 NOTE — Addendum Note (Signed)
Addended by: Wonda Horner on: 10/25/2019 03:16 PM   Modules accepted: Orders

## 2019-10-31 ENCOUNTER — Encounter (INDEPENDENT_AMBULATORY_CARE_PROVIDER_SITE_OTHER): Payer: Medicare Other | Admitting: Ophthalmology

## 2019-10-31 DIAGNOSIS — H3520 Other non-diabetic proliferative retinopathy, unspecified eye: Secondary | ICD-10-CM | POA: Diagnosis not present

## 2019-10-31 DIAGNOSIS — D126 Benign neoplasm of colon, unspecified: Secondary | ICD-10-CM | POA: Diagnosis not present

## 2019-10-31 DIAGNOSIS — I739 Peripheral vascular disease, unspecified: Secondary | ICD-10-CM | POA: Diagnosis not present

## 2019-10-31 DIAGNOSIS — N184 Chronic kidney disease, stage 4 (severe): Secondary | ICD-10-CM | POA: Diagnosis not present

## 2019-10-31 DIAGNOSIS — Z794 Long term (current) use of insulin: Secondary | ICD-10-CM | POA: Diagnosis not present

## 2019-10-31 DIAGNOSIS — E1139 Type 2 diabetes mellitus with other diabetic ophthalmic complication: Secondary | ICD-10-CM | POA: Diagnosis not present

## 2019-10-31 DIAGNOSIS — I25810 Atherosclerosis of coronary artery bypass graft(s) without angina pectoris: Secondary | ICD-10-CM | POA: Diagnosis not present

## 2019-10-31 DIAGNOSIS — I6529 Occlusion and stenosis of unspecified carotid artery: Secondary | ICD-10-CM | POA: Diagnosis not present

## 2019-10-31 DIAGNOSIS — I5189 Other ill-defined heart diseases: Secondary | ICD-10-CM | POA: Diagnosis not present

## 2019-10-31 DIAGNOSIS — E1142 Type 2 diabetes mellitus with diabetic polyneuropathy: Secondary | ICD-10-CM | POA: Diagnosis not present

## 2019-10-31 DIAGNOSIS — I5042 Chronic combined systolic (congestive) and diastolic (congestive) heart failure: Secondary | ICD-10-CM | POA: Diagnosis not present

## 2019-10-31 DIAGNOSIS — D631 Anemia in chronic kidney disease: Secondary | ICD-10-CM | POA: Diagnosis not present

## 2019-11-10 ENCOUNTER — Ambulatory Visit (INDEPENDENT_AMBULATORY_CARE_PROVIDER_SITE_OTHER): Payer: Medicare Other | Admitting: Cardiology

## 2019-11-10 ENCOUNTER — Encounter: Payer: Self-pay | Admitting: Cardiology

## 2019-11-10 ENCOUNTER — Other Ambulatory Visit: Payer: Self-pay

## 2019-11-10 VITALS — BP 118/48 | HR 63 | Ht 68.0 in | Wt 216.4 lb

## 2019-11-10 DIAGNOSIS — R0600 Dyspnea, unspecified: Secondary | ICD-10-CM

## 2019-11-10 DIAGNOSIS — I5032 Chronic diastolic (congestive) heart failure: Secondary | ICD-10-CM | POA: Diagnosis not present

## 2019-11-10 DIAGNOSIS — R0609 Other forms of dyspnea: Secondary | ICD-10-CM

## 2019-11-10 DIAGNOSIS — I739 Peripheral vascular disease, unspecified: Secondary | ICD-10-CM

## 2019-11-10 DIAGNOSIS — E113553 Type 2 diabetes mellitus with stable proliferative diabetic retinopathy, bilateral: Secondary | ICD-10-CM | POA: Diagnosis not present

## 2019-11-10 DIAGNOSIS — I25709 Atherosclerosis of coronary artery bypass graft(s), unspecified, with unspecified angina pectoris: Secondary | ICD-10-CM | POA: Diagnosis not present

## 2019-11-10 DIAGNOSIS — R600 Localized edema: Secondary | ICD-10-CM

## 2019-11-10 DIAGNOSIS — Z951 Presence of aortocoronary bypass graft: Secondary | ICD-10-CM | POA: Diagnosis not present

## 2019-11-10 DIAGNOSIS — I10 Essential (primary) hypertension: Secondary | ICD-10-CM | POA: Diagnosis not present

## 2019-11-10 DIAGNOSIS — Z9861 Coronary angioplasty status: Secondary | ICD-10-CM | POA: Diagnosis not present

## 2019-11-10 DIAGNOSIS — I2510 Atherosclerotic heart disease of native coronary artery without angina pectoris: Secondary | ICD-10-CM | POA: Diagnosis not present

## 2019-11-10 DIAGNOSIS — E669 Obesity, unspecified: Secondary | ICD-10-CM

## 2019-11-10 DIAGNOSIS — Z794 Long term (current) use of insulin: Secondary | ICD-10-CM

## 2019-11-10 DIAGNOSIS — E785 Hyperlipidemia, unspecified: Secondary | ICD-10-CM | POA: Diagnosis not present

## 2019-11-10 DIAGNOSIS — I6522 Occlusion and stenosis of left carotid artery: Secondary | ICD-10-CM | POA: Diagnosis not present

## 2019-11-10 NOTE — Patient Instructions (Signed)
Medication Instructions:   No changes  Continue using Sliding scale for fluid medication  *If you need a refill on your cardiac medications before your next appointment, please call your pharmacy*   Lab Work: Not needed   Testing/Procedures: Not needed   Follow-Up: At [facility], you and your health needs are our priority.  As part of our continuing mission to provide you with exceptional heart care, we have created designated Provider Care Teams.  These Care Teams include your primary Cardiologist (physician) and Advanced Practice Providers (APPs -  Physician Assistants and Nurse Practitioners) who all work together to provide you with the care you need, when you need it.  We recommend signing up for the patient portal called "MyChart".  Sign up information is provided on this After Visit Summary.  MyChart is used to connect with patients for Virtual Visits (Telemedicine).  Patients are able to view lab/test results, encounter notes, upcoming appointments, etc.  Non-urgent messages can be sent to your provider as well.   To learn more about what you can do with MyChart, go to [email or web address].    Your next appointment:   6 month(s)  The format for your next appointment:   In Person  Provider:   Glenetta Hew, MD   Other Instructions

## 2019-11-10 NOTE — Progress Notes (Signed)
Primary Care Provider: Reynold Bowen, MD Cardiologist: Glenetta Hew, MD Electrophysiologist: None  Clinic Note: Chief Complaint  Patient presents with  . Follow-up    3-4 month  . Coronary Artery Disease    no active angina  . Congestive Heart Failure    edema better    HPI:    Lance Collier is a 74 y.o. male with a PMH notable for CAD (PCI followed by CABG then PCI to SVG-LCx and RCA), HFpEF, CKD-3, DM-2 with PN and retinopathy, severe PAD with limiting claudication, who presents today for actually 1 month follow-up   CAD:   ? December 2012: 2 overlapping DES to RCA;  ? January 2015: CABG [facility], SVG-Cx);  ? April 2016 -DES PCI to SVG-RCA.  PAD -most recently left femoral endarterectomy with bilateral common iliac stenting and profundoplasty September 2019 -> unfortunately not much notable improvement in symptoms  Carotid Artery Disease - Left Carotid Endarterectomy November 4656  Chronic Diastolic Heart Failure  CKD 3-4  I last saw him August 10, 2019 the follow-up exacerbation of HFpEF with worsening edema.  He was having orthopnea and PND as well as edema.  We notably increased his diuretic regimen and for short burst of Zaroxolyn brought his weight back down again.  At that visit he was doing fairly well.  Not quite back to baseline weight, but notably improved breathing.  Lance Collier was last seen on October 17, 2019 by Jory Sims, NP.  This was a 1 month follow-up after presentation with some worsening edema-HFpEF symptoms.  Was not at this point interested in hemodialysis.  Diuretics have been managed by nephrology for the most part.  He has also declined Feraheme infusion.  Maintained on Lasix 160 mg twice daily. -> Was doing well.  Still noted by significant PAD symptoms.  No angina.  Mild DOE but mostly sedentary.  Recent Hospitalizations: None  Reviewed  CV studies:    The following studies were reviewed today: (if available,  images/films reviewed: From Epic Chart or Care Everywhere) . No new studies:   Interval History:   Lance Collier returns here today overall feeling pretty well from straight up cardiac standpoint.  He continues to be limited by his leg claudication able to walk maybe 2 to 3 minutes at a time without stopping.  He is walking more than he had been.  He gets short of breath, but says that it usually occurs after he has been having quite a bit of pain in his legs.  He has morning leg pain as well, but this is all stable. His edema has pretty much gone away now.  He is adjusting his Lasix.  And he notes that his breathing is definitely better.  Still has some orthopnea, but no significant PND. He has not had any anginal symptoms or any symptoms of arrhythmias, syncope or near syncope.  CV Review of Symptoms (Summary) Cardiovascular ROS: positive for - dyspnea on exertion and Well-controlled edema, stable lifestyle limiting claudication negative for - chest pain, irregular heartbeat, palpitations, paroxysmal nocturnal dyspnea, rapid heart rate, shortness of breath or Syncope/near syncope, TIA/amaurosis fugax, claudication  The patient does not have symptoms concerning for COVID-19 infection (fever, chills, cough, or new shortness of breath).  The patient is practicing social distancing & Masking.   He has had both of his COVID-19 vaccines.   REVIEWED OF SYSTEMS   Review of Systems  Constitutional: Positive for malaise/fatigue (Does not have a lot of energy simply  because he does not do much). Negative for weight loss.  HENT: Negative for nosebleeds.   Cardiovascular: Positive for claudication and leg swelling (Stable).  Gastrointestinal: Negative for abdominal pain, blood in stool and melena.  Genitourinary: Negative for hematuria.  Musculoskeletal: Positive for joint pain and neck pain (He says he slept funny last night, and has had neck pain all day). Negative for falls and myalgias.    Neurological: Negative for dizziness, focal weakness and weakness.  Endo/Heme/Allergies: Bruises/bleeds easily.  Psychiatric/Behavioral: Negative for memory loss. The patient does not have insomnia.   --> His vision seems to doing better after his most recent injections with Dr. Zadie Rhine.  Able to see a little bit better with his left eye.   I have reviewed and (if needed) personally updated the patient's problem list, medications, allergies, past medical and surgical history, social and family history.   PAST MEDICAL HISTORY   Past Medical History:  Diagnosis Date  . Anemia   . Aortic sclerosis  - without Stenosis  March 2014   Echo: Aortic sclerosis without stenosis. EF 55-60% with normal WM; mild concentric hypertrophy with normal relaxation.  . Arthritis   . CAD (coronary artery disease), autologous vein bypass graft 09/2014   10/03/14: Cath for Class III-IV Angina -- 80% ostial-proximal SVG-RPDA (minimal flow down native PDA from native RCA with 50% proximal and distal, patent LIMA-LAD, SVG-OM with retrograde filling of OM 2. --> 10/12/14: Staged PCI of SVG-RPDA - Xience DES  3.0 mm x 38 mm; (3.4  - 3.3 mm)  . CAD S/P percutaneous coronary angioplasty 06/12/2011; 09/2104   a) 2012: Complex PCI mRCA ([place]) --  2 overlapping Promus element DES stents.; 09/2014:  ost-prox SVG-rPDA - Xience DES 3.0 mm x 38 mm; (3.4  - 3.3 mm)  . CHF (congestive heart failure) ([place])   . Childhood asthma   . CKD (chronic kidney disease), stage III    "mild; related to diabetes" (10/02/2014)  . Complication of anesthesia    "I've had name recall recognition problems since my last anesthesia"  . DM (diabetes mellitus) type II uncontrolled with eye manifestation ([place]) 2013   Diabetic retinopathy with retinal hemorrhages since;, recurrent in August 2014 treated with Avastin shots  . Dyspnea    mainly with exertion  . GERD (gastroesophageal reflux disease)   . Heart murmur    questionable  . High  cholesterol   . Hypertension   . Migraine ~ 2004   "once"  . PAD (peripheral artery disease) (HCC)    with claudication  . PONV (postoperative nausea and vomiting)    s/p tonsillectomy  . 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 07/01/2013 (Cath for Crescendo Unstable Angina) --> [name]: For LM disease; LIMA-LAD, SVG-RCA, SCG-Cx; b. 09/2014 Cath/Staged PCI: patent LIMA->LAD, VG->OM1/RI, VG->RCA 80% (3.0x38 Xience Alpine DES on 10/12/2014).  . 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  Likely thromboembolic event with PDA occlusion. 06/12/2011   Post Complex PCI, pt states no mi  . Wears glasses    reading    PAST SURGICAL HISTORY   Past Surgical History:  Procedure Laterality Date  . ABDOMINAL AORTOGRAM W/LOWER EXTREMITY N/A 02/05/2018   Procedure: ABDOMINAL AORTOGRAM W/LOWER EXTREMITY;  Surgeon: Elam Dutch, MD;  Location: [place] CV [facility];  Service: Cardiovascular;  Laterality: N/A;  . COLONOSCOPY    . CORONARY ARTERY BYPASS GRAFT N/A 07/01/2013   Procedure: CORONARY ARTERY BYPASS GRAFTING (CABG);  Surgeon: Grace Isaac, MD;  Location: [place];  Service: Open Heart Surgery;  Laterality: N/A;  Times 3 using left internal mammary artery and endoscopically harvested bilateral saphenous vein  . ENDARTERECTOMY Left 05/21/2016   Procedure: LEFT CAROTID ENDARTERECTOMY;  Surgeon: Elam Dutch, MD;  Location: [place];  Service: Vascular;  Laterality: Left;  . ENDARTERECTOMY FEMORAL Left 02/23/2018   Procedure: LEFT COMMON FEMORAL ENDARTERECTOMY;  Surgeon: Elam Dutch, MD;  Location: [place];  Service: Vascular;  Laterality: Left;  . EYE SURGERY Bilateral    ioc for cataracts  . INSERTION OF ILIAC STENT Bilateral 02/23/2018   Procedure: INSERTION OF BILATERAL COMMON ILIAC STENTS WITH VIABAHN STENTS;  Surgeon: Elam Dutch, MD;  Location: [place];  Service: Vascular;  Laterality: Bilateral;  . INTRAOPERATIVE TRANSESOPHAGEAL ECHOCARDIOGRAM N/A  07/01/2013   Procedure: INTRAOPERATIVE TRANSESOPHAGEAL ECHOCARDIOGRAM;  Surgeon: Grace Isaac, MD;  Location: [place];  Service: Open Heart Surgery;  Laterality: N/A;  . LEFT AND RIGHT HEART CATHETERIZATION WITH CORONARY/GRAFT ANGIOGRAM N/A 10/03/2014   Procedure: LEFT AND RIGHT HEART CATHETERIZATION WITH Beatrix Fetters;  Surgeon: Leonie Man, MD;  Location: [facility] CATH LAB;  Service: Cardiovascular; severe prox RCA 80% (dRCA w/ competetive flow & 60% hazy ISR in Native RCA), patent LIMA-LAD  & SVG-OM1-RI - retrograde fills OM2, known native LM & mod OM2.  Severe Systemic HTN - LVEDP/PCWP 22 & 28 mmHg, Normal CO/CI  . LEFT HEART CATHETERIZATION WITH CORONARY ANGIOGRAM N/A 06/11/2011   Procedure: LEFT HEART CATHETERIZATION WITH CORONARY ANGIOGRAM;  Surgeon: Fulton Reek, MD;  Location: [place] CATH LAB;;  Dense LM & prox LAD, prox RCA calcification. Large bifurcating OM1 & small OM2 normal. Minor LAD & D1 dz, tortuous prox-mid RCA w/ 60% &  subtotal occluded dRCA w/ normal PDA & PLV   . LEFT HEART CATHETERIZATION WITH CORONARY ANGIOGRAM N/A 06/28/2013   Procedure: LEFT HEART CATHETERIZATION WITH CORONARY ANGIOGRAM;  Surgeon: Leonie Man, MD;  Location: [facility] CATH LAB;  Service: Cardiovascular;  ostial LM disese, RCA ~40-50%ISR  . NM MYOVIEW LTD  04/29/2018   LEXISCAN: Non-gated study with small fixed moderate intensity defect in the apical inferior wall suspicious for either small area of infarction versus attenuation (no change from previous study).  LOW RISK.   Marland Kitchen PATCH ANGIOPLASTY Left 05/21/2016   Procedure: PATCH ANGIOPLASTY USING HEMASHIELD PLATINUM FINESSE 0.3in x 3 in;  Surgeon: Elam Dutch, MD;  Location: [place];  Service: Vascular;  Laterality: Left;  . PATCH ANGIOPLASTY Left 02/23/2018   Procedure: PATCH ANGIOPLASTY LEFT COMMON FEMORAL ARTERY;  Surgeon: Elam Dutch, MD;  Location: [place];  Service: Vascular;  Laterality: Left;  . PERCUTANEOUS CORONARY STENT INTERVENTION (PCI-S)   06/11/2011   Procedure: PERCUTANEOUS CORONARY STENT INTERVENTION (PCI-S);  Surgeon: Fulton Reek, MD;  Location: [facility] CATH LAB;  Service: Cardiovascular;;2 overlapping Promus DES 2.39mm at 12 mm x2; postdilated to 3 mm  . PERCUTANEOUS CORONARY STENT INTERVENTION (PCI-S) N/A 10/12/2014   Procedure: PERCUTANEOUS CORONARY STENT INTERVENTION (PCI-S);  Surgeon: Leonie Man, MD;  Location: [facility] CATH LAB;  Ostial-Prox SVG-RCA Xience Alpine DES 3.0 mm x 38 mm; (3.4  - 3.3 mm)  . RETINAL LASER PROCEDURE Bilateral    "more than once"   . TONSILLECTOMY  as child  . TRANSTHORACIC ECHOCARDIOGRAM  03/2017    EF 55-60%.  Normal thickness.  No R WMA..  GR 2 DD.  Mild-mod calcified aortic valve.  Mild RV systolic dysfunction.  Moderate RV  dilation.  Moderately increased PA pressures.;;; 07/2019: EF 65%.  No WMA.  Unable to assess diastolic marginal RV PA pressures.  Mild to moderate LA dilation with moderate mild aortic valve sclerosis but no stenosis.    MEDICATIONS/ALLERGIES   Current Meds  Medication Sig  . acetaminophen (TYLENOL) 325 MG tablet Take 650 mg by mouth daily as needed for mild pain or headache.   Marland Kitchen amLODipine (NORVASC) 10 MG tablet Take 1 tablet by mouth daily.  Marland Kitchen aspirin EC 81 MG tablet Take 1 tablet (81 mg total) by mouth every other day. (Patient taking differently: Take 81 mg by mouth See admin instructions. Take 81mg  once daily on Tues, Thurs, Sat, and Sun)  . carvedilol (COREG) 12.5 MG tablet TAKE 1 TABLET BY MOUTH TWICE DAILY.  Marland Kitchen clopidogrel (PLAVIX) 75 MG tablet TAKE 1 TABLET DAILY WITH BREAKFAST.  Marland Kitchen Continuous Blood Gluc Sensor (FREESTYLE LIBRE 14 DAY SENSOR) MISC MONITOR BS CONTINUOUSLY  CHANGE Q 14 DAYS.  Marland Kitchen docusate sodium (STOOL SOFTENER) 100 MG capsule Take 100 mg by mouth daily as needed for mild constipation.   . Evolocumab (REPATHA SURECLICK) 416 MG/ML SOAJ Inject 140 mg into the skin every 14 (fourteen) days.  . fenofibrate 160 MG tablet Take 1 tablet (160 mg total) by mouth  daily.  . furosemide (LASIX) 80 MG tablet Take 120mg  (1.5tablets) in the AM and 80mg  (1tablet) in the PM.  . HUMALOG KWIKPEN 200 UNIT/ML SOPN Inject 20-28 Units into the skin See admin instructions. Use 20 units (base) per meal increase units given by 1 unit for every 15 increment over 150 (I.E. Blood glucose reading 165=21 units, 180=22 units, etc. )  . hydrALAZINE (APRESOLINE) 100 MG tablet TAKE 1 TABLET BY MOUTH 3 TIMES A DAY.  . isosorbide mononitrate (IMDUR) 30 MG 24 hr tablet Take 1 tablet (30 mg total) by mouth every evening. This is in addition to 60 mg in the morning.  . isosorbide mononitrate (IMDUR) 60 MG 24 hr tablet TAKE 1 TABLET ONCE DAILY.  Marland Kitchen ketoconazole (NIZORAL) 2 % cream As directed  . pantoprazole (PROTONIX) 40 MG tablet TAKE 1 TABLET ONCE DAILY.  . rosuvastatin (CRESTOR) 10 MG tablet TAKE 1 TABLET ON TUESDAY, THURSDAY, SATURDAY AND SUNDAY.  Marland Kitchen TOUJEO MAX SOLOSTAR 300 UNIT/ML SOPN Inject 66 Units into the skin 2 (two) times daily.   Marland Kitchen VASCEPA 1 G CAPS Take 2 g by mouth 2 (two) times daily.     Allergies  Allergen Reactions  . Atorvastatin Other (See Comments)    Leg pain  . Ibuprofen Hives  . Pravastatin Other (See Comments)    Leg pain  . Simvastatin Other (See Comments)    Leg pain    SOCIAL HISTORY/FAMILY HISTORY   Reviewed in Epic:  Pertinent findings: He has now retired.  Very much limited as far as activity goes.  OBJCTIVE -PE, EKG, labs   Wt Readings from Last 3 Encounters:  11/10/19 216 lb 6.4 oz (98.2 kg)  10/17/19 216 lb (98 kg)  09/14/19 215 lb 3.2 oz (97.6 kg)    Physical Exam: BP (!) 118/48   Pulse 63   Ht 5\' 8"  (1.727 m)   Wt 216 lb 6.4 oz (98.2 kg)   BMI 32.90 kg/m  Physical Exam  Constitutional: He is oriented to person, place, and time. He appears well-developed and well-nourished.  Obese gentleman.  Well-groomed.  Has chronic ill appearance--in general fatigued, but nontoxic.  HENT:  Head: Normocephalic and atraumatic.  Neck:  Hepatojugular  reflux (Trivial) present. No JVD present. Carotid bruit is present.  His neck is somewhat stiff today, but  Cardiovascular: Normal rate, regular rhythm and S1 normal.  Occasional extrasystoles are present. PMI is not displaced. Exam reveals gallop, distant heart sounds and decreased pulses (Barely palpable pedal pulses).  Murmur heard. High-pitched crescendo-decrescendo early systolic murmur is present with a grade of 2/6 at the upper right sternal border radiating to the neck.  Blowing holosystolic murmur of grade 1/6 is also present at the lower left sternal border and apex. Split S2  Pulmonary/Chest: Effort normal and breath sounds normal. No respiratory distress. He has no wheezes.  Abdominal: Soft. Bowel sounds are normal. He exhibits no distension. There is no abdominal tenderness.  Truncal obesity.  Difficult to assess HSM  Musculoskeletal:        General: Edema (Trivial) present. Normal range of motion.     Cervical back: Full passive range of motion without pain and neck supple.  Neurological: He is alert and oriented to person, place, and time.  Psychiatric: He has a normal mood and affect. His behavior is normal. Judgment and thought content normal.  Vitals reviewed.   Adult ECG Report  Rate: 63 ;  Rhythm: normal sinus rhythm, premature atrial contractions (PAC), premature ventricular contractions (PVC) and 1 degree AVB.  LVH with repolarization changes.  Otherwise normal axis, intervals and durations.;   Narrative Interpretation: Stable EKG.  Recent Labs: July 01, 2019: TC 228, TG 396, HDL 23, LDL 126. Lab Results  Component Value Date   CHOL 150 12/02/2018   HDL 23 (L) 12/02/2018   LDLCALC 59 12/02/2018   TRIG 341 (H) 12/02/2018   CHOLHDL 6.5 (H) 12/02/2018   Lab Results  Component Value Date   CREATININE 3.23 (H) 09/21/2019   BUN 56 (H) 09/21/2019   NA 139 09/21/2019   K 4.6 09/21/2019   CL 98 09/21/2019   CO2 24 09/21/2019   Lab Results    Component Value Date   TSH 4.660 (H) 04/13/2018    ASSESSMENT/PLAN    Problem List Items Addressed This Visit    DOE (dyspnea on exertion) (Chronic)    I think this is probably more related to deconditioning and pain from claudication.  No active anginal symptoms.  Likely has HFpEF.      Coronary artery disease involving coronary bypass graft with angina pectoris ([place]) (Chronic)   Relevant Orders   EKG 12-Lead (Completed)   S/P CABG (coronary artery bypass graft)  x 3, 07/01/13, LIMA-LAD; VG-RCA; VG- LCX (Chronic)   Asymptomatic stenosis of left carotid artery without infarction (Chronic)    Status post carotid endarterectomy.  Followed by Dr. Oneida Alar.      Chronic diastolic heart failure (HCC) - Primary (Chronic)    Now having probably class II symptoms.  Edema well controlled.  We talked about sliding scale dosing of his Lasix based on weight change and edema.  Currently taking 120 mg in the morning and 80 mg in the evening.  He was simply bumped 120 mg twice daily if worsening edema.  Low threshold to consider as needed Zaroxolyn.  But will defer to his nephrologist.      Relevant Orders   EKG 12-Lead (Completed)   CAD S/P PCI SVG-RCA Xience Alpine DES 3.0 mm x 38 mm (ostial-prox) - 3.34mm (Chronic)    He has had PCI to SVG-RCA back in 2016.  Since then has not had any further anginal symptoms.  Plan: Continue current dose carvedilol  and amlodipine along with high-dose Imdur. Is on aggressive lipid management protocol with Repatha, Crestor, fenofibrate and Vascepa.  Remains on Plavix along with every other day aspirin.  I think we can simply stop aspirin.  Okay to hold Plavix 5-7 days preop for surgeries or procedures.      Relevant Orders   EKG 12-Lead (Completed)   Essential hypertension (Chronic)    Blood pressure actually seems pretty good.  He is on amlodipine which is there for antianginal and claudication reasons as well as his ESRD.  He is on hydralazine and  carvedilol.  No need for further titration.  With his renal insufficiency, he is not on ARB/ACE inhibitor despite diabetes.  Defer to nephrology and PCP.  (Currently on hydralazine with nitrate as part of afterload reduction).      Hyperlipidemia with target LDL less than 70; intolerance to atorvastatin and simvastatin (Chronic)    Recent labs were very unusual labs.  Now he is on Repatha and rosuvastatin as well as fenofibrate and Vascepa.  Should be due for labs to be rechecked this summer with PCP.      Obesity (BMI 30-39.9) (Chronic)    We talked about trying to increase his level exercise.  Basically he needs to find at least 5-6 times a day to do at least 3 to 5 minutes of walking.  Watch his dietary intake.  We talked on healthy diet.      Left main coronary artery disease; with RCA in-stent restenosis (Chronic)    Significant native CAD notably with left main disease referred for CABG in January 2015.  Has had PCI pre and post CABG.  No current anginal symptoms.      Relevant Orders   EKG 12-Lead (Completed)   PAD (peripheral artery disease) both Rt and Lt disease with Lt ICA of 60-79% (Chronic)    Unfortunately, he has minimal revascularization options per vascular surgery.  He himself is indicated that he does not want any more invasive procedures and is resigned to his limiting claudication symptoms.  I talked about try to do water walking or elliptical trainers. He is on maximum cardiovascular medications and never got much benefit from Pletal.      Edema of both legs (Chronic)    He probably does have venous stasis changes.  I do not want to stop amlodipine because his blood pressure looks great. He could wear lightweight support stockings, but would not want more aggressive support stockings because of his PAD. Also talked about importance of foot elevation.      Controlled type 2 diabetes mellitus with stable proliferative retinopathy of both eyes, with long-term  current use of insulin (HCC) (Chronic)    Followed by Dr. Forde Dandy.  Is now on Toujeo and before every meal insulin, he is now using a [employer]' and notes his sugars are well controlled..  With his CKD and HFpEF, would consider [place]        .  Overall, unstable cardiac regimen.  No new changes need to be made.  We did simply talked about sliding scale Lasix.   COVID-19 Education: The signs and symptoms of COVID-19 were discussed with the patient and how to seek care for testing (follow up with PCP or arrange E-visit).   The importance of social distancing and COVID-19 vaccination was discussed today.  I spent a total of 15minutes with the patient. >  50% of the time was spent in direct patient consultation.  Additional time spent  with chart review  / charting (studies, outside notes, etc): 8 Total Time: 28 min   Current medicines are reviewed at length with the patient today.  (+/- concerns) N/A  Notice: This dictation was prepared with Dragon dictation along with smaller phrase technology. Any transcriptional errors that result from this process are unintentional and may not be corrected upon review.  Patient Instructions / Medication Changes & Studies & Tests Ordered   Patient Instructions  Medication Instructions:   No changes  Continue using Sliding scale for fluid medication  *If you need a refill on your cardiac medications before your next appointment, please call your pharmacy*   Lab Work: Not needed   Testing/Procedures: Not needed   Follow-Up: At [facility], you and your health needs are our priority.  As part of our continuing mission to provide you with exceptional heart care, we have created designated Provider Care Teams.  These Care Teams include your primary Cardiologist (physician) and Advanced Practice Providers (APPs -  Physician Assistants and Nurse Practitioners) who all work together to provide you with the care you need, when you need  it.  We recommend signing up for the patient portal called "MyChart".  Sign up information is provided on this After Visit Summary.  MyChart is used to connect with patients for Virtual Visits (Telemedicine).  Patients are able to view lab/test results, encounter notes, upcoming appointments, etc.  Non-urgent messages can be sent to your provider as well.   To learn more about what you can do with MyChart, go to [email or web address].    Your next appointment:   6 month(s)  The format for your next appointment:   In Person  Provider:   Glenetta Hew, MD   Other Instructions    Studies Ordered:   Orders Placed This Encounter  Procedures  . EKG 12-Lead     Glenetta Hew, M.D., M.S. Interventional Cardiologist   Pager # [phone] Phone # [phone] [address] [place], [place] [address]   Thank you for choosing Heartcare at [facility]!!

## 2019-11-13 ENCOUNTER — Encounter: Payer: Self-pay | Admitting: Cardiology

## 2019-11-13 NOTE — Assessment & Plan Note (Signed)
We talked about trying to increase his level exercise.  Basically he needs to find at least 5-6 times a day to do at least 3 to 5 minutes of walking.  Watch his dietary intake.  We talked on healthy diet.

## 2019-11-13 NOTE — Assessment & Plan Note (Signed)
I think this is probably more related to deconditioning and pain from claudication.  No active anginal symptoms.  Likely has HFpEF.

## 2019-11-13 NOTE — Assessment & Plan Note (Addendum)
Followed by Dr. Forde Dandy.  Is now on Toujeo and before every meal insulin, he is now using a [employer]' and notes his sugars are well controlled..  With his CKD and HFpEF, would consider [place]

## 2019-11-13 NOTE — Assessment & Plan Note (Addendum)
Recent labs were very unusual labs.  Now he is on Repatha and rosuvastatin as well as fenofibrate and Vascepa.  Should be due for labs to be rechecked this summer with PCP.

## 2019-11-13 NOTE — Assessment & Plan Note (Signed)
He probably does have venous stasis changes.  I do not want to stop amlodipine because his blood pressure looks great. He could wear lightweight support stockings, but would not want more aggressive support stockings because of his PAD. Also talked about importance of foot elevation.

## 2019-11-13 NOTE — Assessment & Plan Note (Signed)
Unfortunately, he has minimal revascularization options per vascular surgery.  He himself is indicated that he does not want any more invasive procedures and is resigned to his limiting claudication symptoms.  I talked about try to do water walking or elliptical trainers. He is on maximum cardiovascular medications and never got much benefit from Pletal.

## 2019-11-13 NOTE — Assessment & Plan Note (Signed)
Status post carotid endarterectomy.  Followed by Dr. Oneida Alar.

## 2019-11-13 NOTE — Assessment & Plan Note (Signed)
Significant native CAD notably with left main disease referred for CABG in January 2015.  Has had PCI pre and post CABG.  No current anginal symptoms.

## 2019-11-13 NOTE — Assessment & Plan Note (Signed)
He has had PCI to SVG-RCA back in 2016.  Since then has not had any further anginal symptoms.  Plan: Continue current dose carvedilol and amlodipine along with high-dose Imdur. Is on aggressive lipid management protocol with Repatha, Crestor, fenofibrate and Vascepa.  Remains on Plavix along with every other day aspirin.  I think we can simply stop aspirin.  Okay to hold Plavix 5-7 days preop for surgeries or procedures.

## 2019-11-13 NOTE — Assessment & Plan Note (Signed)
Blood pressure actually seems pretty good.  He is on amlodipine which is there for antianginal and claudication reasons as well as his ESRD.  He is on hydralazine and carvedilol.  No need for further titration.  With his renal insufficiency, he is not on ARB/ACE inhibitor despite diabetes.  Defer to nephrology and PCP.  (Currently on hydralazine with nitrate as part of afterload reduction).

## 2019-11-13 NOTE — Assessment & Plan Note (Signed)
Now having probably class II symptoms.  Edema well controlled.  We talked about sliding scale dosing of his Lasix based on weight change and edema.  Currently taking 120 mg in the morning and 80 mg in the evening.  He was simply bumped 120 mg twice daily if worsening edema.  Low threshold to consider as needed Zaroxolyn.  But will defer to his nephrologist.

## 2019-11-14 ENCOUNTER — Ambulatory Visit (INDEPENDENT_AMBULATORY_CARE_PROVIDER_SITE_OTHER): Payer: Medicare Other | Admitting: Ophthalmology

## 2019-11-14 ENCOUNTER — Encounter (INDEPENDENT_AMBULATORY_CARE_PROVIDER_SITE_OTHER): Payer: Self-pay | Admitting: Ophthalmology

## 2019-11-14 ENCOUNTER — Other Ambulatory Visit: Payer: Self-pay

## 2019-11-14 DIAGNOSIS — E113512 Type 2 diabetes mellitus with proliferative diabetic retinopathy with macular edema, left eye: Secondary | ICD-10-CM

## 2019-11-14 MED ORDER — BEVACIZUMAB CHEMO INJECTION 1.25MG/0.05ML SYRINGE FOR KALEIDOSCOPE
1.2500 mg | INTRAVITREAL | Status: AC | PRN
  Administered 2019-11-14: 1.25 mg via INTRAVITREAL

## 2019-11-14 NOTE — Progress Notes (Signed)
11/14/2019     CHIEF COMPLAINT Patient presents for Retina Follow Up   HISTORY OF PRESENT ILLNESS: Lance Collier is a 74 y.o. male who presents to the clinic today for:   HPI    Retina Follow Up    Patient presents with  Diabetic Retinopathy.  In left eye.  Duration of 5 weeks.  Since onset it is stable.          Comments    5 week follow up - OCT OU, Possible Avastin OS Patient states his vision has improved and overall has no complaints. A1C 6.29 Oct 2019  LBS 107 this AM        Last edited by Gerda Diss on 11/14/2019 11:12 AM. (History)      Referring physician: Reynold Bowen, MD [place],  [place] [address]  HISTORICAL INFORMATION:   Selected notes from the MEDICAL RECORD NUMBER    Lab Results  Component Value Date   HGBA1C 7.3 (H) 02/16/2018     CURRENT MEDICATIONS: No current outpatient medications on file. (Ophthalmic Drugs)   No current facility-administered medications for this visit. (Ophthalmic Drugs)   Current Outpatient Medications (Other)  Medication Sig  . acetaminophen (TYLENOL) 325 MG tablet Take 650 mg by mouth daily as needed for mild pain or headache.   Marland Kitchen amLODipine (NORVASC) 10 MG tablet Take 1 tablet by mouth daily.  Marland Kitchen aspirin EC 81 MG tablet Take 1 tablet (81 mg total) by mouth every other day. (Patient taking differently: Take 81 mg by mouth See admin instructions. Take 81mg  once daily on Tues, Thurs, Sat, and Sun)  . carvedilol (COREG) 12.5 MG tablet TAKE 1 TABLET BY MOUTH TWICE DAILY.  Marland Kitchen clopidogrel (PLAVIX) 75 MG tablet TAKE 1 TABLET DAILY WITH BREAKFAST.  Marland Kitchen Continuous Blood Gluc Sensor (FREESTYLE LIBRE 14 DAY SENSOR) MISC MONITOR BS CONTINUOUSLY  CHANGE Q 14 DAYS.  Marland Kitchen docusate sodium (STOOL SOFTENER) 100 MG capsule Take 100 mg by mouth daily as needed for mild constipation.   . Evolocumab (REPATHA SURECLICK) 734 MG/ML SOAJ Inject 140 mg into the skin every 14 (fourteen) days.  . fenofibrate 160 MG tablet Take 1  tablet (160 mg total) by mouth daily.  . furosemide (LASIX) 80 MG tablet Take 120mg  (1.5tablets) in the AM and 80mg  (1tablet) in the PM.  . HUMALOG KWIKPEN 200 UNIT/ML SOPN Inject 20-28 Units into the skin See admin instructions. Use 20 units (base) per meal increase units given by 1 unit for every 15 increment over 150 (I.E. Blood glucose reading 165=21 units, 180=22 units, etc. )  . hydrALAZINE (APRESOLINE) 100 MG tablet TAKE 1 TABLET BY MOUTH 3 TIMES A DAY.  . isosorbide mononitrate (IMDUR) 30 MG 24 hr tablet Take 1 tablet (30 mg total) by mouth every evening. This is in addition to 60 mg in the morning.  . isosorbide mononitrate (IMDUR) 60 MG 24 hr tablet TAKE 1 TABLET ONCE DAILY.  Marland Kitchen ketoconazole (NIZORAL) 2 % cream As directed  . pantoprazole (PROTONIX) 40 MG tablet TAKE 1 TABLET ONCE DAILY.  . rosuvastatin (CRESTOR) 10 MG tablet TAKE 1 TABLET ON TUESDAY, THURSDAY, SATURDAY AND SUNDAY.  Marland Kitchen TOUJEO MAX SOLOSTAR 300 UNIT/ML SOPN Inject 66 Units into the skin 2 (two) times daily.   Marland Kitchen VASCEPA 1 G CAPS Take 2 g by mouth 2 (two) times daily.    No current facility-administered medications for this visit. (Other)      REVIEW OF SYSTEMS:    ALLERGIES  Allergies  Allergen Reactions  . Atorvastatin Other (See Comments)    Leg pain  . Ibuprofen Hives  . Pravastatin Other (See Comments)    Leg pain  . Simvastatin Other (See Comments)    Leg pain    PAST MEDICAL HISTORY Past Medical History:  Diagnosis Date  . Anemia   . Aortic sclerosis  - without Stenosis  March 2014   Echo: Aortic sclerosis without stenosis. EF 55-60% with normal WM; mild concentric hypertrophy with normal relaxation.  . Arthritis   . CAD (coronary artery disease), autologous vein bypass graft 09/2014   10/03/14: Cath for Class III-IV Angina -- 80% ostial-proximal SVG-RPDA (minimal flow down native PDA from native RCA with 50% proximal and distal, patent LIMA-LAD, SVG-OM with retrograde filling of OM 2. --> 10/12/14:  Staged PCI of SVG-RPDA - Xience DES  3.0 mm x 38 mm; (3.4  - 3.3 mm)  . CAD S/P percutaneous coronary angioplasty 06/12/2011; 09/2104   a) 2012: Complex PCI mRCA ([place]) --  2 overlapping Promus element DES stents.; 09/2014:  ost-prox SVG-rPDA - Xience DES 3.0 mm x 38 mm; (3.4  - 3.3 mm)  . CHF (congestive heart failure) ([facility])   . Childhood asthma   . CKD (chronic kidney disease), stage III    "mild; related to diabetes" (10/02/2014)  . Complication of anesthesia    "I've had name recall recognition problems since my last anesthesia"  . DM (diabetes mellitus) type II uncontrolled with eye manifestation ([facility]) 2013   Diabetic retinopathy with retinal hemorrhages since;, recurrent in August 2014 treated with Avastin shots  . Dyspnea    mainly with exertion  . GERD (gastroesophageal reflux disease)   . Heart murmur    questionable  . High cholesterol   . Hypertension   . Migraine ~ 2004   "once"  . PAD (peripheral artery disease) (H[facility])    with claudication  . PONV (postoperative nausea and vomiting)    s/p tonsillectomy  . 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 07/01/2013 (Cath for [place] Unstable Angina) --> [name]: For LM disease; LIMA-LAD, SVG-RCA, SCG-Cx; b. 09/2014 Cath/Staged PCI: patent LIMA->LAD, VG->OM1/RI, VG->RCA 80% (3.0x38 Xience Alpine DES on 10/12/2014).  . 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  Likely thromboembolic event with PDA occlusion. 06/12/2011   Post Complex PCI, pt states no mi  . Wears glasses    reading   Past Surgical History:  Procedure Laterality Date  . ABDOMINAL AORTOGRAM W/LOWER EXTREMITY N/A 02/05/2018   Procedure: ABDOMINAL AORTOGRAM W/LOWER EXTREMITY;  Surgeon: Elam Dutch, MD;  Location: [facility];  Service: Cardiovascular;  Laterality: N/A;  . COLONOSCOPY    . CORONARY ARTERY BYPASS GRAFT N/A 07/01/2013   Procedure: CORONARY ARTERY BYPASS GRAFTING (CABG);  Surgeon: Grace Isaac,  MD;  Location: [facility];  Service: Open Heart Surgery;  Laterality: N/A;  Times 3 using left internal mammary artery and endoscopically harvested bilateral saphenous vein  . ENDARTERECTOMY Left 05/21/2016   Procedure: LEFT CAROTID ENDARTERECTOMY;  Surgeon: Elam Dutch, MD;  Location: [facility];  Service: Vascular;  Laterality: Left;  . ENDARTERECTOMY FEMORAL Left 02/23/2018   Procedure: LEFT COMMON FEMORAL ENDARTERECTOMY;  Surgeon: Elam Dutch, MD;  Location: [facility];  Service: Vascular;  Laterality: Left;  . EYE SURGERY Bilateral    ioc for cataracts  . INSERTION OF ILIAC STENT Bilateral 02/23/2018   Procedure: INSERTION OF BILATERAL COMMON ILIAC STENTS  WITH VIABAHN STENTS;  Surgeon: Elam Dutch, MD;  Location: [facility];  Service: Vascular;  Laterality: Bilateral;  . INTRAOPERATIVE TRANSESOPHAGEAL ECHOCARDIOGRAM N/A 07/01/2013   Procedure: INTRAOPERATIVE TRANSESOPHAGEAL ECHOCARDIOGRAM;  Surgeon: Grace Isaac, MD;  Location: [facility];  Service: Open Heart Surgery;  Laterality: N/A;  . LEFT AND RIGHT HEART CATHETERIZATION WITH CORONARY/GRAFT ANGIOGRAM N/A 10/03/2014   Procedure: LEFT AND RIGHT HEART CATHETERIZATION WITH Beatrix Fetters;  Surgeon: Leonie Man, MD;  Location: [facility];  Service: Cardiovascular; severe prox RCA 80% (dRCA w/ competetive flow & 60% hazy ISR in Native RCA), patent LIMA-LAD  & SVG-OM1-RI - retrograde fills OM2, known native LM & mod OM2.  Severe Systemic HTN - LVEDP/PCWP 22 & 28 mmHg, Normal CO/CI  . LEFT HEART CATHETERIZATION WITH CORONARY ANGIOGRAM N/A 06/11/2011   Procedure: LEFT HEART CATHETERIZATION WITH CORONARY ANGIOGRAM;  Surgeon: Fulton Reek, MD;  Location: [facility]ATH LAB;;  Dense LM & prox LAD, prox RCA calcification. Large bifurcating OM1 & small OM2 normal. Minor LAD & D1 dz, tortuous prox-mid RCA w/ 60% &  subtotal occluded dRCA w/ normal PDA & PLV   . LEFT HEART CATHETERIZATION WITH CORONARY ANGIOGRAM N/A 06/28/2013   Procedure: LEFT HEART  CATHETERIZATION WITH CORONARY ANGIOGRAM;  Surgeon: Leonie Man, MD;  Location: [facility] CATH LAB;  Service: Cardiovascular;  ostial LM disese, RCA ~40-50%ISR  . NM MYOVIEW LTD  04/29/2018   LEXISCAN: Non-gated study with small fixed moderate intensity defect in the apical inferior wall suspicious for either small area of infarction versus attenuation (no change from previous study).  LOW RISK.   Marland Kitchen PATCH ANGIOPLASTY Left 05/21/2016   Procedure: PATCH ANGIOPLASTY USING HEMASHIELD PLATINUM FINESSE 0.3in x 3 in;  Surgeon: Elam Dutch, MD;  Location: [facility];  Service: Vascular;  Laterality: Left;  . PATCH ANGIOPLASTY Left 02/23/2018   Procedure: PATCH ANGIOPLASTY LEFT COMMON FEMORAL ARTERY;  Surgeon: Elam Dutch, MD;  Location: [facility];  Service: Vascular;  Laterality: Left;  . PERCUTANEOUS CORONARY STENT INTERVENTION (PCI-S)  06/11/2011   Procedure: PERCUTANEOUS CORONARY STENT INTERVENTION (PCI-S);  Surgeon: Fulton Reek, MD;  Location: [facility] CATH LAB;  Service: Cardiovascular;;2 overlapping Promus DES 2.73mm at 12 mm x2; postdilated to 3 mm  . PERCUTANEOUS CORONARY STENT INTERVENTION (PCI-S) N/A 10/12/2014   Procedure: PERCUTANEOUS CORONARY STENT INTERVENTION (PCI-S);  Surgeon: Leonie Man, MD;  Location: [facility] CATH LAB;  Ostial-Prox SVG-RCA Xience Alpine DES 3.0 mm x 38 mm; (3.4  - 3.3 mm)  . RETINAL LASER PROCEDURE Bilateral    "more than once"   . TONSILLECTOMY  as child  . TRANSTHORACIC ECHOCARDIOGRAM  03/2017    EF 55-60%.  Normal thickness.  No R WMA..  GR 2 DD.  Mild-mod calcified aortic valve.  Mild RV systolic dysfunction.  Moderate RV dilation.  Moderately increased PA pressures.;;; 07/2019: EF 65%.  No WMA.  Unable to assess diastolic marginal RV PA pressures.  Mild to moderate LA dilation with moderate mild aortic valve sclerosis but no stenosis.    FAMILY HISTORY Family History  Problem Relation Age of Onset  . Lung cancer Sister   . Breast cancer Sister   . Breast cancer Mother     . Hyperlipidemia Father   . Hypertension Father   . Throat cancer Father   . Colon polyps Father   . Liver cancer Maternal Grandmother   . Stomach cancer Neg Hx   . Rectal cancer Neg Hx   . Colon cancer  Neg Hx   . Esophageal cancer Neg Hx     SOCIAL HISTORY Social History   Tobacco Use  . Smoking status: Former Smoker    Packs/day: 2.00    Years: 27.00    Pack years: 54.00    Types: Cigarettes    Quit date: 11/23/1996    Years since quitting: 22.9  . Smokeless tobacco: Never Used  Substance Use Topics  . Alcohol use: Yes    Comment: rarely  . Drug use: No         OPHTHALMIC EXAM:  Base Eye Exam    Visual Acuity (Snellen - Linear)      Right Left   Dist [place] 20/30+1 20/60+1   Dist ph [place] NI 20/50-1   Correction: Glasses       Tonometry (Tonopen, 11:15 AM)      Right Left   Pressure 10 7       Pupils      Pupils Dark Light Shape React APD   Right PERRL 3 2 Round Slow None   Left PERRL 3 2 Round Slow None       Visual Fields (Counting fingers)      Left Right   Restrictions Partial outer superior temporal, inferior temporal, superior nasal, inferior nasal deficiencies Partial outer superior temporal deficiency       Extraocular Movement      Right Left    Full Full       Neuro/Psych    Oriented x3: Yes   Mood/Affect: Normal       Dilation    Left eye: 1.0% Mydriacyl, 2.5% Phenylephrine @ 11:15 AM        Slit Lamp and Fundus Exam    External Exam      Right Left   External Normal Normal       Slit Lamp Exam      Right Left   Lids/Lashes Normal Normal   Conjunctiva/Sclera White and quiet White and quiet   Cornea Clear Clear   Anterior Chamber Deep and quiet Deep and quiet   Iris Round and reactive Round and reactive   Lens Posterior chamber intraocular lens Posterior chamber intraocular lens   Anterior Vitreous Normal Normal       Fundus Exam      Right Left   Posterior Vitreous  Mild vitreous hemorrhage remains   C/D Ratio  0.2    Macula  Microaneurysms   Vessels  PDR quiet   Periphery  good PRP          IMAGING AND PROCEDURES  Imaging and Procedures for 11/14/19  OCT, Retina - OU - Both Eyes       Right Eye Quality was good. Scan locations included subfoveal. Central Foveal Thickness: 286. Findings include normal observations.   Left Eye Quality was good. Scan locations included subfoveal. Central Foveal Thickness: 303.   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 Treatment options were outlined including medical therapy, laser & vitrectomy. The use of injectable medications reviewed, including Avastin, Lucentis, and Eylea. Periodic injections into the eye are likely to resolve diabetic macular edema (swelling in the center of vision). Initially, injections are delivered are delivered  every 4-6 weeks, and the interval extended as the condition improves. On average, 8-9 injections the first year, and 5 in year 2. Improvement in the condition most often improves on medical therapy. Occasional use of focal laser is also recommended for residual macular edema (swelling). Excellent control of blood glucose and blood pressure are encouraged under the care of a primary physician or endocrinologist. Similarly, attempts to maintain serum cholesterol, low density lipoproteins, and high-density lipoproteins in a favorable range were recommended.       ICD-10-CM   1. 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  2.  3.  Ophthalmic Meds Ordered this visit:  No orders of the  defined types were placed in this encounter.      Return in about 6 weeks (around 12/26/2019) for AVASTIN OCT, OS.  There are no Patient Instructions on file for this visit.   Explained the diagnoses, plan, and follow up with the patient and they expressed understanding.  Patient expressed understanding of the importance of proper follow up care.   Clent Demark Mickie Badders M.D. Diseases & Surgery of the Retina and Vitreous Retina & Diabetic [place] 11/14/19     Abbreviations: M myopia (nearsighted); A astigmatism; H hyperopia (farsighted); P presbyopia; Mrx spectacle prescription;  CTL contact lenses; OD right eye; OS left eye; OU both eyes  XT exotropia; ET esotropia; PEK punctate epithelial keratitis; PEE punctate epithelial erosions; DES dry eye syndrome; MGD meibomian gland dysfunction; ATs artificial tears; PFAT's preservative free artificial tears; [place] nuclear sclerotic cataract; PSC posterior subcapsular cataract; ERM epi-retinal membrane; PVD posterior vitreous detachment; RD retinal detachment; DM diabetes mellitus; DR diabetic retinopathy; NPDR non-proliferative diabetic retinopathy; PDR proliferative diabetic retinopathy; CSME clinically significant macular edema; DME diabetic macular edema; dbh dot blot hemorrhages; CWS cotton wool spot; POAG primary open angle glaucoma; C/D cup-to-disc ratio; HVF humphrey visual field; GVF goldmann visual field; OCT optical coherence tomography; IOP intraocular pressure; BRVO Branch retinal vein occlusion; CRVO central retinal vein occlusion; CRAO central retinal artery occlusion; BRAO branch retinal artery occlusion; RT retinal tear; SB scleral buckle; PPV pars plana vitrectomy; VH Vitreous hemorrhage; PRP panretinal laser photocoagulation; IVK intravitreal kenalog; VMT vitreomacular traction; MH Macular hole;  NVD neovascularization of the disc; NVE neovascularization elsewhere; AREDS age related eye disease study; ARMD age related macular degeneration;  POAG primary open angle glaucoma; EBMD epithelial/anterior basement membrane dystrophy; ACIOL anterior chamber intraocular lens; IOL intraocular lens; PCIOL posterior chamber intraocular lens; Phaco/IOL phacoemulsification with intraocular lens placement; [place] photorefractive keratectomy; LASIK laser assisted in situ keratomileusis; HTN hypertension; DM diabetes mellitus; COPD chronic obstructive pulmonary disease

## 2019-11-14 NOTE — Assessment & Plan Note (Signed)
The nature of diabetic macular edema was discussed with the patient. Treatment options were outlined including medical therapy, laser & vitrectomy. The use of injectable medications reviewed, including Avastin, Lucentis, and Eylea. Periodic injections into the eye are likely to resolve diabetic macular edema (swelling in the center of vision). Initially, injections are delivered are delivered every 4-6 weeks, and the interval extended as the condition improves. On average, 8-9 injections the first year, and 5 in year 2. Improvement in the condition most often improves on medical therapy. Occasional use of focal laser is also recommended for residual macular edema (swelling). Excellent control of blood glucose and blood pressure are encouraged under the care of a primary physician or endocrinologist. Similarly, attempts to maintain serum cholesterol, low density lipoproteins, and high-density lipoproteins in a favorable range were recommended.

## 2019-12-19 ENCOUNTER — Encounter (INDEPENDENT_AMBULATORY_CARE_PROVIDER_SITE_OTHER): Payer: Medicare Other | Admitting: Ophthalmology

## 2019-12-27 ENCOUNTER — Encounter (INDEPENDENT_AMBULATORY_CARE_PROVIDER_SITE_OTHER): Payer: Medicare Other | Admitting: Ophthalmology

## 2020-01-09 ENCOUNTER — Ambulatory Visit (INDEPENDENT_AMBULATORY_CARE_PROVIDER_SITE_OTHER): Payer: Medicare Other | Admitting: Ophthalmology

## 2020-01-09 ENCOUNTER — Encounter (INDEPENDENT_AMBULATORY_CARE_PROVIDER_SITE_OTHER): Payer: Self-pay | Admitting: Ophthalmology

## 2020-01-09 ENCOUNTER — Other Ambulatory Visit: Payer: Self-pay

## 2020-01-09 DIAGNOSIS — E113512 Type 2 diabetes mellitus with proliferative diabetic retinopathy with macular edema, left eye: Secondary | ICD-10-CM | POA: Diagnosis not present

## 2020-01-09 DIAGNOSIS — H4312 Vitreous hemorrhage, left eye: Secondary | ICD-10-CM

## 2020-01-09 MED ORDER — BEVACIZUMAB CHEMO INJECTION 1.25MG/0.05ML SYRINGE FOR KALEIDOSCOPE
1.2500 mg | INTRAVITREAL | Status: AC | PRN
  Administered 2020-01-09: 1.25 mg via INTRAVITREAL

## 2020-01-09 NOTE — Progress Notes (Signed)
01/09/2020     CHIEF COMPLAINT Patient presents for Retina Follow Up   HISTORY OF PRESENT ILLNESS: Lance Collier is a 74 y.o. male who presents to the clinic today for:   HPI    Retina Follow Up    Patient presents with  Diabetic Retinopathy.  In left eye.  Severity is moderate.  Duration of 8 weeks.  Since onset it is stable.  I, the attending physician,  performed the HPI with the patient and updated documentation appropriately.          Comments    8 Week PDR f\u OS. Possible Avastin OS. OCT  Pt states OS vision is back to 100%. Denies any complaints. BGL: 170        Last edited by Tilda Franco on 01/09/2020  8:57 AM. (History)      Referring physician: Reynold Bowen, MD [address] [place],  [place] [address]  HISTORICAL INFORMATION:   Selected notes from the MEDICAL RECORD NUMBER    Lab Results  Component Value Date   HGBA1C 7.3 (H) 02/16/2018     CURRENT MEDICATIONS: No current outpatient medications on file. (Ophthalmic Drugs)   No current facility-administered medications for this visit. (Ophthalmic Drugs)   Current Outpatient Medications (Other)  Medication Sig  . acetaminophen (TYLENOL) 325 MG tablet Take 650 mg by mouth daily as needed for mild pain or headache.   Marland Kitchen amLODipine (NORVASC) 10 MG tablet Take 1 tablet by mouth daily.  Marland Kitchen aspirin EC 81 MG tablet Take 1 tablet (81 mg total) by mouth every other day. (Patient taking differently: Take 81 mg by mouth See admin instructions. Take 81mg  once daily on Tues, Thurs, Sat, and Sun)  . carvedilol (COREG) 12.5 MG tablet TAKE 1 TABLET BY MOUTH TWICE DAILY.  Marland Kitchen clopidogrel (PLAVIX) 75 MG tablet TAKE 1 TABLET DAILY WITH BREAKFAST.  Marland Kitchen Continuous Blood Gluc Sensor (FREESTYLE LIBRE 14 DAY SENSOR) MISC MONITOR BS CONTINUOUSLY  CHANGE Q 14 DAYS.  Marland Kitchen docusate sodium (STOOL SOFTENER) 100 MG capsule Take 100 mg by mouth daily as needed for mild constipation.   . Evolocumab (REPATHA SURECLICK) 767 MG/ML  SOAJ Inject 140 mg into the skin every 14 (fourteen) days.  . fenofibrate 160 MG tablet Take 1 tablet (160 mg total) by mouth daily.  . furosemide (LASIX) 80 MG tablet Take 120mg  (1.5tablets) in the AM and 80mg  (1tablet) in the PM.  . HUMALOG KWIKPEN 200 UNIT/ML SOPN Inject 20-28 Units into the skin See admin instructions. Use 20 units (base) per meal increase units given by 1 unit for every 15 increment over 150 (I.E. Blood glucose reading 165=21 units, 180=22 units, etc. )  . hydrALAZINE (APRESOLINE) 100 MG tablet TAKE 1 TABLET BY MOUTH 3 TIMES A DAY.  . isosorbide mononitrate (IMDUR) 30 MG 24 hr tablet Take 1 tablet (30 mg total) by mouth every evening. This is in addition to 60 mg in the morning.  . isosorbide mononitrate (IMDUR) 60 MG 24 hr tablet TAKE 1 TABLET ONCE DAILY.  Marland Kitchen ketoconazole (NIZORAL) 2 % cream As directed  . pantoprazole (PROTONIX) 40 MG tablet TAKE 1 TABLET ONCE DAILY.  . rosuvastatin (CRESTOR) 10 MG tablet TAKE 1 TABLET ON TUESDAY, THURSDAY, SATURDAY AND SUNDAY.  Marland Kitchen TOUJEO MAX SOLOSTAR 300 UNIT/ML SOPN Inject 66 Units into the skin 2 (two) times daily.   Marland Kitchen VASCEPA 1 G CAPS Take 2 g by mouth 2 (two) times daily.    No current facility-administered medications for  this visit. (Other)      REVIEW OF SYSTEMS:    ALLERGIES Allergies  Allergen Reactions  . Atorvastatin Other (See Comments)    Leg pain  . Ibuprofen Hives  . Pravastatin Other (See Comments)    Leg pain  . Simvastatin Other (See Comments)    Leg pain    PAST MEDICAL HISTORY Past Medical History:  Diagnosis Date  . Anemia   . Aortic sclerosis  - without Stenosis  March 2014   Echo: Aortic sclerosis without stenosis. EF 55-60% with normal WM; mild concentric hypertrophy with normal relaxation.  . Arthritis   . CAD (coronary artery disease), autologous vein bypass graft 09/2014   10/03/14: Cath for Class III-IV Angina -- 80% ostial-proximal SVG-RPDA (minimal flow down native PDA from native RCA with  50% proximal and distal, patent LIMA-LAD, SVG-OM with retrograde filling of OM 2. --> 10/12/14: Staged PCI of SVG-RPDA - Xience DES  3.0 mm x 38 mm; (3.4  - 3.3 mm)  . CAD S/P percutaneous coronary angioplasty 06/12/2011; 09/2104   a) 2012: Complex PCI mRCA ([place]) --  2 overlapping Promus element DES stents.; 09/2014:  ost-prox SVG-rPDA - Xience DES 3.0 mm x 38 mm; (3.4  - 3.3 mm)  . CHF (congestive heart failure) ([place])   . Childhood asthma   . CKD (chronic kidney disease), stage III    "mild; related to diabetes" (10/02/2014)  . Complication of anesthesia    "I've had name recall recognition problems since my last anesthesia"  . DM (diabetes mellitus) type II uncontrolled with eye manifestation ([place]) 2013   Diabetic retinopathy with retinal hemorrhages since;, recurrent in August 2014 treated with Avastin shots  . Dyspnea    mainly with exertion  . GERD (gastroesophageal reflux disease)   . Heart murmur    questionable  . High cholesterol   . Hypertension   . Migraine ~ 2004   "once"  . PAD (peripheral artery disease) (HCC)    with claudication  . PONV (postoperative nausea and vomiting)    s/p tonsillectomy  . 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 07/01/2013 (Cath for [place] Unstable Angina) --> [name]: For LM disease; LIMA-LAD, SVG-RCA, SCG-Cx; b. 09/2014 Cath/Staged PCI: patent LIMA->LAD, VG->OM1/RI, VG->RCA 80% (3.0x38 Xience Alpine DES on 10/12/2014).  . 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  Likely thromboembolic event with PDA occlusion. 06/12/2011   Post Complex PCI, pt states no mi  . Wears glasses    reading   Past Surgical History:  Procedure Laterality Date  . ABDOMINAL AORTOGRAM W/LOWER EXTREMITY N/A 02/05/2018   Procedure: ABDOMINAL AORTOGRAM W/LOWER EXTREMITY;  Surgeon: Elam Dutch, MD;  Location: [facility];  Service: Cardiovascular;  Laterality: N/A;  . COLONOSCOPY    . CORONARY ARTERY BYPASS GRAFT N/A  07/01/2013   Procedure: CORONARY ARTERY BYPASS GRAFTING (CABG);  Surgeon: Grace Isaac, MD;  Location: [facility];  Service: Open Heart Surgery;  Laterality: N/A;  Times 3 using left internal mammary artery and endoscopically harvested bilateral saphenous vein  . ENDARTERECTOMY Left 05/21/2016   Procedure: LEFT CAROTID ENDARTERECTOMY;  Surgeon: Elam Dutch, MD;  Location: [facility];  Service: Vascular;  Laterality: Left;  . ENDARTERECTOMY FEMORAL Left 02/23/2018   Procedure: LEFT COMMON FEMORAL ENDARTERECTOMY;  Surgeon: Elam Dutch, MD;  Location: [facility];  Service: Vascular;  Laterality: Left;  . EYE SURGERY Bilateral    ioc for cataracts  .  INSERTION OF ILIAC STENT Bilateral 02/23/2018   Procedure: INSERTION OF BILATERAL COMMON ILIAC STENTS WITH VIABAHN STENTS;  Surgeon: Elam Dutch, MD;  Location: [facility];  Service: Vascular;  Laterality: Bilateral;  . INTRAOPERATIVE TRANSESOPHAGEAL ECHOCARDIOGRAM N/A 07/01/2013   Procedure: INTRAOPERATIVE TRANSESOPHAGEAL ECHOCARDIOGRAM;  Surgeon: Grace Isaac, MD;  Location: [facility];  Service: Open Heart Surgery;  Laterality: N/A;  . LEFT AND RIGHT HEART CATHETERIZATION WITH CORONARY/GRAFT ANGIOGRAM N/A 10/03/2014   Procedure: LEFT AND RIGHT HEART CATHETERIZATION WITH Beatrix Fetters;  Surgeon: Leonie Man, MD;  Location: [facility] C[facility] LAB;  Service: Cardiovascular; severe prox RCA 80% (dRCA w/ competetive flow & 60% hazy ISR in Native RCA), patent LIMA-LAD  & SVG-OM1-RI - retrograde fills OM2, known native LM & mod OM2.  Severe Systemic HTN - LVEDP/PCWP 22 & 28 mmHg, Normal CO/CI  . LEFT HEART CATHETERIZATION WITH CORONARY ANGIOGRAM N/A 06/11/2011   Procedure: LEFT HEART CATHETERIZATION WITH CORONARY ANGIOGRAM;  Surgeon: Fulton Reek, MD;  Location: [facility];;  Dense LM & prox LAD, prox RCA calcification. Large bifurcating OM1 & small OM2 normal. Minor LAD & D1 dz, tortuous prox-mid RCA w/ 60% &  subtotal occluded dRCA w/ normal PDA & PLV   .  LEFT HEART CATHETERIZATION WITH CORONARY ANGIOGRAM N/A 06/28/2013   Procedure: LEFT HEART CATHETERIZATION WITH CORONARY ANGIOGRAM;  Surgeon: Leonie Man, MD;  Location: [facility] CATH LAB;  Service: Cardiovascular;  ostial LM disese, RCA ~40-50%ISR  . NM MYOVIEW LTD  04/29/2018   LEXISCAN: Non-gated study with small fixed moderate intensity defect in the apical inferior wall suspicious for either small area of infarction versus attenuation (no change from previous study).  LOW RISK.   Marland Kitchen PATCH ANGIOPLASTY Left 05/21/2016   Procedure: PATCH ANGIOPLASTY USING HEMASHIELD PLATINUM FINESSE 0.3in x 3 in;  Surgeon: Elam Dutch, MD;  Location: [facility];  Service: Vascular;  Laterality: Left;  . PATCH ANGIOPLASTY Left 02/23/2018   Procedure: PATCH ANGIOPLASTY LEFT COMMON FEMORAL ARTERY;  Surgeon: Elam Dutch, MD;  Location: [facility];  Service: Vascular;  Laterality: Left;  . PERCUTANEOUS CORONARY STENT INTERVENTION (PCI-S)  06/11/2011   Procedure: PERCUTANEOUS CORONARY STENT INTERVENTION (PCI-S);  Surgeon: Fulton Reek, MD;  Location: [facility] C[facility];  Service: Cardiovascular;;2 overlapping Promus DES 2.10mm at 12 mm x2; postdilated to 3 mm  . PERCUTANEOUS CORONARY STENT INTERVENTION (PCI-S) N/A 10/12/2014   Procedure: PERCUTANEOUS CORONARY STENT INTERVENTION (PCI-S);  Surgeon: Leonie Man, MD;  Location: [facility] CATH LAB;  Ostial-Prox SVG-RCA Xience Alpine DES 3.0 mm x 38 mm; (3.4  - 3.3 mm)  . RETINAL LASER PROCEDURE Bilateral    "more than once"   . TONSILLECTOMY  as child  . TRANSTHORACIC ECHOCARDIOGRAM  03/2017    EF 55-60%.  Normal thickness.  No R WMA..  GR 2 DD.  Mild-mod calcified aortic valve.  Mild RV systolic dysfunction.  Moderate RV dilation.  Moderately increased PA pressures.;;; 07/2019: EF 65%.  No WMA.  Unable to assess diastolic marginal RV PA pressures.  Mild to moderate LA dilation with moderate mild aortic valve sclerosis but no stenosis.    FAMILY HISTORY Family History  Problem Relation  Age of Onset  . Lung cancer Sister   . Breast cancer Sister   . Breast cancer Mother   . Hyperlipidemia Father   . Hypertension Father   . Throat cancer Father   . Colon polyps Father   . Liver cancer Maternal Grandmother   . Stomach cancer  Neg Hx   . Rectal cancer Neg Hx   . Colon cancer Neg Hx   . Esophageal cancer Neg Hx     SOCIAL HISTORY Social History   Tobacco Use  . Smoking status: Former Smoker    Packs/day: 2.00    Years: 27.00    Pack years: 54.00    Types: Cigarettes    Quit date: 11/23/1996    Years since quitting: 23.1  . Smokeless tobacco: Never Used  Vaping Use  . Vaping Use: Never used  Substance Use Topics  . Alcohol use: Yes    Comment: rarely  . Drug use: No         OPHTHALMIC EXAM:  Base Eye Exam    Visual Acuity (Snellen - Linear)      Right Left   Dist [place] 20/30 -1 20/40   Dist ph [place]  20/30       Tonometry (Tonopen, 9:02 AM)      Right Left   Pressure 12 12       Pupils      Pupils Dark Light Shape React APD   Right PERRL 3 2 Round Slow None   Left PERRL 3 2 Round Slow None       Visual Fields (Counting fingers)      Left Right     Full   Restrictions Partial outer superior temporal, superior nasal deficiencies        Neuro/Psych    Oriented x3: Yes   Mood/Affect: Normal       Dilation    Left eye: 1.0% Mydriacyl, 2.5% Phenylephrine @ 9:02 AM        Slit Lamp and Fundus Exam    External Exam      Right Left   External Normal Normal       Slit Lamp Exam      Right Left   Lids/Lashes Normal Normal   Conjunctiva/Sclera White and quiet White and quiet   Cornea Clear Clear   Anterior Chamber Deep and quiet Deep and quiet   Iris Round and reactive Round and reactive   Lens Posterior chamber intraocular lens Posterior chamber intraocular lens   Anterior Vitreous Normal Normal       Fundus Exam      Right Left   Posterior Vitreous  Mild vitreous hemorrhage remains   Disc  Normal   C/D Ratio  0.2   Macula   Microaneurysms, no clinically significant macular edema   Vessels  PDR quiet   Periphery  good PRP          IMAGING AND PROCEDURES  Imaging and Procedures for 01/09/20  OCT, Retina - OU - Both Eyes       Right Eye Quality was good. Scan locations included subfoveal. Central Foveal Thickness: 287. Progression has improved.   Left Eye Quality was good. Scan locations included subfoveal. Central Foveal Thickness: 308. Progression has improved.   Notes OS, medial opacity, some of this may be intrinsic IOL  opacity.  No vitreous haze  No active CSME today       Intravitreal Injection, Pharmacologic Agent - OS - Left Eye       Time Out 01/09/2020. 9:14 AM. Confirmed correct patient, procedure, site, and patient consented.   Anesthesia Topical anesthesia was used. Anesthetic medications included [place] 3.5%.   Procedure Preparation included Tobramycin 0.3%, 10% betadine to eyelids, 5% betadine to ocular surface. A 30 gauge needle was used.   Injection:  1.25  mg Bevacizumab (AVASTIN) SOLN   NDC: [address]-1, Lot: [id number]   Route: Intravitreal, Site: Left Eye, Waste: 0 mg  Post-op Post injection exam found visual acuity of at least counting fingers. The patient tolerated the procedure well. There were no complications. The patient received written and verbal post procedure care education. Post injection medications were not given.                 ASSESSMENT/PLAN:  Vitreous hemorrhage of left eye (HCC) Much less vitreous haze, now clear.  Discussed with patient that we will repeat the injection today to maintain the current improved status and near normal vision that he is enjoying.  But then lengthen the interval examination to 10 weeks.      ICD-10-CM   1. 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 Vitreous hemorrhage of left eye (HCC)  H43.12     1.  Repeat intravitreal Avastin OS today,  2.  Examination OS OD in 10 weeks  3.  Ophthalmic Meds Ordered this visit:  Meds ordered this encounter  Medications  . Bevacizumab (AVASTIN) SOLN 1.25 mg       Return in about 10 weeks (around 03/19/2020) for dilate, OS, AVASTIN OCT.  There are no Patient Instructions on file for this visit.   Explained the diagnoses, plan, and follow up with the patient and they expressed understanding.  Patient expressed understanding of the importance of proper follow up care.   Clent Demark Stevi Hollinshead M.D. Diseases & Surgery of the Retina and Vitreous Retina & Diabetic [place] 01/09/20     Abbreviations: M myopia (nearsighted); A astigmatism; H hyperopia (farsighted); P presbyopia; Mrx spectacle prescription;  CTL contact lenses; OD right eye; OS left eye; OU both eyes  XT exotropia; ET esotropia; PEK punctate epithelial keratitis; PEE punctate epithelial erosions; DES dry eye syndrome; MGD meibomian gland dysfunction; ATs artificial tears; PFAT's preservative free artificial tears; [place] nuclear sclerotic cataract; PSC posterior subcapsular cataract; ERM epi-retinal membrane; PVD posterior vitreous detachment; RD retinal detachment; DM diabetes mellitus; DR diabetic retinopathy; NPDR non-proliferative diabetic retinopathy; PDR proliferative diabetic retinopathy; CSME clinically significant macular edema; DME diabetic macular edema; dbh dot blot hemorrhages; CWS cotton wool spot; POAG primary open angle glaucoma; C/D cup-to-disc ratio; HVF humphrey visual field; GVF goldmann visual field; OCT optical coherence tomography; IOP intraocular pressure; BRVO Branch retinal vein occlusion; CRVO central retinal vein occlusion; CRAO central retinal artery occlusion; BRAO branch retinal artery occlusion; RT retinal tear; SB scleral buckle; PPV pars plana vitrectomy; VH  Vitreous hemorrhage; PRP panretinal laser photocoagulation; IVK intravitreal kenalog; VMT vitreomacular traction; MH Macular hole;  NVD neovascularization of the disc; NVE neovascularization elsewhere; AREDS age related eye disease study; ARMD age related macular degeneration; POAG primary open angle glaucoma; EBMD epithelial/anterior basement membrane dystrophy; ACIOL anterior chamber intraocular lens; IOL intraocular lens; PCIOL posterior chamber intraocular lens; Phaco/IOL phacoemulsification with intraocular lens placement; [place] photorefractive keratectomy; LASIK laser assisted in situ keratomileusis; HTN hypertension; DM diabetes mellitus; COPD chronic obstructive pulmonary disease

## 2020-01-09 NOTE — Assessment & Plan Note (Signed)
Much less vitreous haze, now clear.  Discussed with patient that we will repeat the injection today to maintain the current improved status and near normal vision that he is enjoying.  But then lengthen the interval examination to 10 weeks.

## 2020-01-19 DIAGNOSIS — I129 Hypertensive chronic kidney disease with stage 1 through stage 4 chronic kidney disease, or unspecified chronic kidney disease: Secondary | ICD-10-CM | POA: Diagnosis not present

## 2020-01-19 DIAGNOSIS — R609 Edema, unspecified: Secondary | ICD-10-CM | POA: Diagnosis not present

## 2020-01-19 DIAGNOSIS — N184 Chronic kidney disease, stage 4 (severe): Secondary | ICD-10-CM | POA: Diagnosis not present

## 2020-01-19 DIAGNOSIS — E1122 Type 2 diabetes mellitus with diabetic chronic kidney disease: Secondary | ICD-10-CM | POA: Diagnosis not present

## 2020-01-19 DIAGNOSIS — I2510 Atherosclerotic heart disease of native coronary artery without angina pectoris: Secondary | ICD-10-CM | POA: Diagnosis not present

## 2020-01-19 DIAGNOSIS — D649 Anemia, unspecified: Secondary | ICD-10-CM | POA: Diagnosis not present

## 2020-01-19 DIAGNOSIS — I739 Peripheral vascular disease, unspecified: Secondary | ICD-10-CM | POA: Diagnosis not present

## 2020-01-27 ENCOUNTER — Other Ambulatory Visit: Payer: Self-pay | Admitting: Cardiology

## 2020-02-21 DIAGNOSIS — D631 Anemia in chronic kidney disease: Secondary | ICD-10-CM | POA: Diagnosis not present

## 2020-02-23 DIAGNOSIS — M25531 Pain in right wrist: Secondary | ICD-10-CM | POA: Diagnosis not present

## 2020-02-28 DIAGNOSIS — D631 Anemia in chronic kidney disease: Secondary | ICD-10-CM | POA: Diagnosis not present

## 2020-02-29 DIAGNOSIS — Z23 Encounter for immunization: Secondary | ICD-10-CM | POA: Diagnosis not present

## 2020-03-01 DIAGNOSIS — D631 Anemia in chronic kidney disease: Secondary | ICD-10-CM | POA: Diagnosis not present

## 2020-03-01 DIAGNOSIS — Z79899 Other long term (current) drug therapy: Secondary | ICD-10-CM | POA: Diagnosis not present

## 2020-03-01 DIAGNOSIS — E113553 Type 2 diabetes mellitus with stable proliferative diabetic retinopathy, bilateral: Secondary | ICD-10-CM | POA: Diagnosis not present

## 2020-03-01 DIAGNOSIS — R946 Abnormal results of thyroid function studies: Secondary | ICD-10-CM | POA: Diagnosis not present

## 2020-03-01 DIAGNOSIS — I10 Essential (primary) hypertension: Secondary | ICD-10-CM | POA: Diagnosis not present

## 2020-03-01 DIAGNOSIS — E785 Hyperlipidemia, unspecified: Secondary | ICD-10-CM | POA: Diagnosis not present

## 2020-03-01 DIAGNOSIS — I5042 Chronic combined systolic (congestive) and diastolic (congestive) heart failure: Secondary | ICD-10-CM | POA: Diagnosis not present

## 2020-03-01 DIAGNOSIS — I739 Peripheral vascular disease, unspecified: Secondary | ICD-10-CM | POA: Diagnosis not present

## 2020-03-01 DIAGNOSIS — E211 Secondary hyperparathyroidism, not elsewhere classified: Secondary | ICD-10-CM | POA: Diagnosis not present

## 2020-03-01 DIAGNOSIS — Z794 Long term (current) use of insulin: Secondary | ICD-10-CM | POA: Diagnosis not present

## 2020-03-01 DIAGNOSIS — N184 Chronic kidney disease, stage 4 (severe): Secondary | ICD-10-CM | POA: Diagnosis not present

## 2020-03-01 DIAGNOSIS — I2510 Atherosclerotic heart disease of native coronary artery without angina pectoris: Secondary | ICD-10-CM | POA: Diagnosis not present

## 2020-03-19 ENCOUNTER — Other Ambulatory Visit: Payer: Self-pay

## 2020-03-19 ENCOUNTER — Encounter (INDEPENDENT_AMBULATORY_CARE_PROVIDER_SITE_OTHER): Payer: Self-pay | Admitting: Ophthalmology

## 2020-03-19 ENCOUNTER — Ambulatory Visit (INDEPENDENT_AMBULATORY_CARE_PROVIDER_SITE_OTHER): Payer: Medicare Other | Admitting: Ophthalmology

## 2020-03-19 DIAGNOSIS — H4312 Vitreous hemorrhage, left eye: Secondary | ICD-10-CM | POA: Diagnosis not present

## 2020-03-19 DIAGNOSIS — Z9861 Coronary angioplasty status: Secondary | ICD-10-CM

## 2020-03-19 DIAGNOSIS — Z794 Long term (current) use of insulin: Secondary | ICD-10-CM

## 2020-03-19 DIAGNOSIS — E113512 Type 2 diabetes mellitus with proliferative diabetic retinopathy with macular edema, left eye: Secondary | ICD-10-CM | POA: Diagnosis not present

## 2020-03-19 DIAGNOSIS — E113553 Type 2 diabetes mellitus with stable proliferative diabetic retinopathy, bilateral: Secondary | ICD-10-CM | POA: Diagnosis not present

## 2020-03-19 DIAGNOSIS — I2510 Atherosclerotic heart disease of native coronary artery without angina pectoris: Secondary | ICD-10-CM

## 2020-03-19 NOTE — Assessment & Plan Note (Signed)
Resolved, clear

## 2020-03-19 NOTE — Progress Notes (Signed)
03/19/2020     CHIEF COMPLAINT Patient presents for Retina Follow Up   HISTORY OF PRESENT ILLNESS: Lance Collier is a 74 y.o. male who presents to the clinic today for:   HPI    Retina Follow Up    Patient presents with  Diabetic Retinopathy.  In left eye.  This started 10 weeks ago.  Severity is mild.  Duration of 10 weeks.  Since onset it is stable.          Comments    10 Week Diabetic F/U OS, poss Avastin OS  Pt denies noticeable changes to [place] OU since last visit. Pt denies ocular pain, flashes of light, or floaters OU.  LBS: "below 40" this AM upon waking       Last edited by Rockie Neighbours, [place] on 03/19/2020  8:03 AM. (History)      Referring physician: Reynold Bowen, MD [place],  [place] [id number]  HISTORICAL INFORMATION:   Selected notes from the [facility]    Lab Results  Component Value Date   HGBA1C 7.3 (H) 02/16/2018     CURRENT MEDICATIONS: No current outpatient medications on file. (Ophthalmic Drugs)   No current facility-administered medications for this visit. (Ophthalmic Drugs)   Current Outpatient Medications (Other)  Medication Sig  . acetaminophen (TYLENOL) 325 MG tablet Take 650 mg by mouth daily as needed for mild pain or headache.   Marland Kitchen amLODipine (NORVASC) 10 MG tablet Take 1 tablet by mouth daily.  Marland Kitchen aspirin EC 81 MG tablet Take 1 tablet (81 mg total) by mouth every other day. (Patient taking differently: Take 81 mg by mouth See admin instructions. Take 81mg  once daily on Tues, Thurs, Sat, and Sun)  . carvedilol (COREG) 12.5 MG tablet TAKE 1 TABLET BY MOUTH TWICE DAILY.  Marland Kitchen clopidogrel (PLAVIX) 75 MG tablet TAKE 1 TABLET DAILY WITH BREAKFAST.  Marland Kitchen Continuous Blood Gluc Sensor (FREESTYLE LIBRE 14 DAY SENSOR) MISC MONITOR BS CONTINUOUSLY  CHANGE Q 14 DAYS.  Marland Kitchen docusate sodium (STOOL SOFTENER) 100 MG capsule Take 100 mg by mouth daily as needed for mild constipation.   . Evolocumab (REPATHA SURECLICK) 481 MG/ML SOAJ  Inject 140 mg into the skin every 14 (fourteen) days.  . fenofibrate 160 MG tablet Take 1 tablet (160 mg total) by mouth daily.  . furosemide (LASIX) 80 MG tablet Take 120mg  (1.5tablets) in the AM and 80mg  (1tablet) in the PM.  . HUMALOG KWIKPEN 200 UNIT/ML SOPN Inject 20-28 Units into the skin See admin instructions. Use 20 units (base) per meal increase units given by 1 unit for every 15 increment over 150 (I.E. Blood glucose reading 165=21 units, 180=22 units, etc. )  . hydrALAZINE (APRESOLINE) 100 MG tablet TAKE 1 TABLET BY MOUTH 3 TIMES A DAY.  . isosorbide mononitrate (IMDUR) 30 MG 24 hr tablet Take 1 tablet (30 mg total) by mouth every evening. This is in addition to 60 mg in the morning.  . isosorbide mononitrate (IMDUR) 60 MG 24 hr tablet TAKE 1 TABLET ONCE DAILY.  Marland Kitchen ketoconazole (NIZORAL) 2 % cream As directed  . pantoprazole (PROTONIX) 40 MG tablet TAKE 1 TABLET ONCE DAILY.  . rosuvastatin (CRESTOR) 10 MG tablet TAKE 1 TABLET ON TUESDAY, THURSDAY, SATURDAY AND SUNDAY.  Marland Kitchen TOUJEO MAX SOLOSTAR 300 UNIT/ML SOPN Inject 66 Units into the skin 2 (two) times daily.   Marland Kitchen VASCEPA 1 G CAPS Take 2 g by mouth 2 (two) times daily.    No current  facility-administered medications for this visit. (Other)      REVIEW OF SYSTEMS:    ALLERGIES Allergies  Allergen Reactions  . Atorvastatin Other (See Comments)    Leg pain  . Ibuprofen Hives  . Pravastatin Other (See Comments)    Leg pain  . Simvastatin Other (See Comments)    Leg pain    PAST MEDICAL HISTORY Past Medical History:  Diagnosis Date  . Anemia   . Aortic sclerosis  - without Stenosis  March 2014   Echo: Aortic sclerosis without stenosis. EF 55-60% with normal WM; mild concentric hypertrophy with normal relaxation.  . Arthritis   . CAD (coronary artery disease), autologous vein bypass graft 09/2014   10/03/14: Cath for Class III-IV Angina -- 80% ostial-proximal SVG-RPDA (minimal flow down native PDA from native RCA with 50%  proximal and distal, patent LIMA-LAD, SVG-OM with retrograde filling of OM 2. --> 10/12/14: Staged PCI of SVG-RPDA - Xience DES  3.0 mm x 38 mm; (3.4  - 3.3 mm)  . CAD S/P percutaneous coronary angioplasty 06/12/2011; 09/2104   a) 2012: Complex PCI mRCA ([place]) --  2 overlapping Promus element DES stents.; 09/2014:  ost-prox SVG-rPDA - Xience DES 3.0 mm x 38 mm; (3.4  - 3.3 mm)  . CHF (congestive heart failure) ([place])   . Childhood asthma   . CKD (chronic kidney disease), stage III    "mild; related to diabetes" (10/02/2014)  . Complication of anesthesia    "I've had name recall recognition problems since my last anesthesia"  . DM (diabetes mellitus) type II uncontrolled with eye manifestation ([place]) 2013   Diabetic retinopathy with retinal hemorrhages since;, recurrent in August 2014 treated with Avastin shots  . Dyspnea    mainly with exertion  . GERD (gastroesophageal reflux disease)   . Heart murmur    questionable  . High cholesterol   . Hypertension   . Migraine ~ 2004   "once"  . PAD (peripheral artery disease) (HCC)    with claudication  . PONV (postoperative nausea and vomiting)    s/p tonsillectomy  . 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 07/01/2013 (Cath for [place] Unstable Angina) --> [name]: For LM disease; LIMA-LAD, SVG-RCA, SCG-Cx; b. 09/2014 Cath/Staged PCI: patent LIMA->LAD, VG->OM1/RI, VG->RCA 80% (3.0x38 Xience Alpine DES on 10/12/2014).  . 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  Likely thromboembolic event with PDA occlusion. 06/12/2011   Post Complex PCI, pt states no mi  . Wears glasses    reading   Past Surgical History:  Procedure Laterality Date  . ABDOMINAL AORTOGRAM W/LOWER EXTREMITY N/A 02/05/2018   Procedure: ABDOMINAL AORTOGRAM W/LOWER EXTREMITY;  Surgeon: Elam Dutch, MD;  Location: [facility];  Service: Cardiovascular;  Laterality: N/A;  . COLONOSCOPY    . CORONARY ARTERY BYPASS GRAFT N/A  07/01/2013   Procedure: CORONARY ARTERY BYPASS GRAFTING (CABG);  Surgeon: Grace Isaac, MD;  Location: [facility];  Service: Open Heart Surgery;  Laterality: N/A;  Times 3 using left internal mammary artery and endoscopically harvested bilateral saphenous vein  . ENDARTERECTOMY Left 05/21/2016   Procedure: LEFT CAROTID ENDARTERECTOMY;  Surgeon: Elam Dutch, MD;  Location: [facility];  Service: Vascular;  Laterality: Left;  . ENDARTERECTOMY FEMORAL Left 02/23/2018   Procedure: LEFT COMMON FEMORAL ENDARTERECTOMY;  Surgeon: Elam Dutch, MD;  Location: [facility];  Service: Vascular;  Laterality: Left;  . EYE SURGERY Bilateral    ioc for  cataracts  . INSERTION OF ILIAC STENT Bilateral 02/23/2018   Procedure: INSERTION OF BILATERAL COMMON ILIAC STENTS WITH VIABAHN STENTS;  Surgeon: Elam Dutch, MD;  Location: [facility];  Service: Vascular;  Laterality: Bilateral;  . INTRAOPERATIVE TRANSESOPHAGEAL ECHOCARDIOGRAM N/A 07/01/2013   Procedure: INTRAOPERATIVE TRANSESOPHAGEAL ECHOCARDIOGRAM;  Surgeon: Grace Isaac, MD;  Location: [facility];  Service: Open Heart Surgery;  Laterality: N/A;  . LEFT AND RIGHT HEART CATHETERIZATION WITH CORONARY/GRAFT ANGIOGRAM N/A 10/03/2014   Procedure: LEFT AND RIGHT HEART CATHETERIZATION WITH Beatrix Fetters;  Surgeon: Leonie Man, MD;  Location[facility] C[facility] LAB;  Service: Cardiovascular; severe prox RCA 80% (dRCA w/ competetive flow & 60% hazy ISR in Native RCA), patent LIMA-LAD  & SVG-OM1-RI - retrograde fills OM2, known native LM & mod OM2.  Severe Systemic HTN - LVEDP/PCWP 22 & 28 mmHg, Normal CO/CI  . LEFT HEART CATHETERIZATION WITH CORONARY ANGIOGRAM N/A 06/11/2011   Procedure: LEFT HEART CATHETERIZATION WITH CORONARY ANGIOGRAM;  Surgeon: Fulton Reek, MD;  Location: [facility] CATH LAB;;  Dense LM & prox LAD, prox RCA calcification. Large bifurcating OM1 & small OM2 normal. Minor LAD & D1 dz, tortuous prox-mid RCA w/ 60% &  subtotal occluded dRCA w/ normal PDA & PLV   .  LEFT HEART CATHETERIZATION WITH CORONARY ANGIOGRAM N/A 06/28/2013   Procedure: LEFT HEART CATHETERIZATION WITH CORONARY ANGIOGRAM;  Surgeon: Leonie Man, MD;  Location: [facility] CATH LAB;  Service: Cardiovascular;  ostial LM disese, RCA ~40-50%ISR  . NM MYOVIEW LTD  04/29/2018   LEXISCAN: Non-gated study with small fixed moderate intensity defect in the apical inferior wall suspicious for either small area of infarction versus attenuation (no change from previous study).  LOW RISK.   Marland Kitchen PATCH ANGIOPLASTY Left 05/21/2016   Procedure: PATCH ANGIOPLASTY USING HEMASHIELD PLATINUM FINESSE 0.3in x 3 in;  Surgeon: Elam Dutch, MD;  Location: [facility];  Service: Vascular;  Laterality: Left;  . PATCH ANGIOPLASTY Left 02/23/2018   Procedure: PATCH ANGIOPLASTY LEFT COMMON FEMORAL ARTERY;  Surgeon: Elam Dutch, MD;  Location: [facility];  Service: Vascular;  Laterality: Left;  . PERCUTANEOUS CORONARY STENT INTERVENTION (PCI-S)  06/11/2011   Procedure: PERCUTANEOUS CORONARY STENT INTERVENTION (PCI-S);  Surgeon: Fulton Reek, MD;  Location: [facility] CATH LAB;  Service: Cardiovascular;;2 overlapping Promus DES 2.52mm at 12 mm x2; postdilated to 3 mm  . PERCUTANEOUS CORONARY STENT INTERVENTION (PCI-S) N/A 10/12/2014   Procedure: PERCUTANEOUS CORONARY STENT INTERVENTION (PCI-S);  Surgeon: Leonie Man, MD;  Location: [facility] CATH LAB;  Ostial-Prox SVG-RCA Xience Alpine DES 3.0 mm x 38 mm; (3.4  - 3.3 mm)  . RETINAL LASER PROCEDURE Bilateral    "more than once"   . TONSILLECTOMY  as child  . TRANSTHORACIC ECHOCARDIOGRAM  03/2017    EF 55-60%.  Normal thickness.  No R WMA..  GR 2 DD.  Mild-mod calcified aortic valve.  Mild RV systolic dysfunction.  Moderate RV dilation.  Moderately increased PA pressures.;;; 07/2019: EF 65%.  No WMA.  Unable to assess diastolic marginal RV PA pressures.  Mild to moderate LA dilation with moderate mild aortic valve sclerosis but no stenosis.    FAMILY HISTORY Family History  Problem Relation  Age of Onset  . Lung cancer Sister   . Breast cancer Sister   . Breast cancer Mother   . Hyperlipidemia Father   . Hypertension Father   . Throat cancer Father   . Colon polyps Father   . Liver cancer Maternal Grandmother   .  Stomach cancer Neg Hx   . Rectal cancer Neg Hx   . Colon cancer Neg Hx   . Esophageal cancer Neg Hx     SOCIAL HISTORY Social History   Tobacco Use  . Smoking status: Former Smoker    Packs/day: 2.00    Years: 27.00    Pack years: 54.00    Types: Cigarettes    Quit date: 11/23/1996    Years since quitting: 23.3  . Smokeless tobacco: Never Used  Vaping Use  . Vaping Use: Never used  Substance Use Topics  . Alcohol use: Yes    Comment: rarely  . Drug use: No         OPHTHALMIC EXAM:  Base Eye Exam    Visual Acuity (ETDRS)      Right Left   Dist [place] 20/25 -1 20/40   Dist ph [place]  20/25       Tonometry (Tonopen, 8:03 AM)      Right Left   Pressure 17 11       Pupils      Pupils Dark Light Shape React APD   Right PERRL 3 2 Round Slow None   Left PERRL 3 2 Round Slow None       Visual Fields (Counting fingers)      Left Right     Full   Restrictions Partial outer superior temporal, superior nasal deficiencies        Extraocular Movement      Right Left    Full Full       Neuro/Psych    Oriented x3: Yes   Mood/Affect: Normal       Dilation    Left eye: 1.0% Mydriacyl, 2.5% Phenylephrine @ 8:06 AM        Slit Lamp and Fundus Exam    External Exam      Right Left   External Normal Normal       Slit Lamp Exam      Right Left   Lids/Lashes Normal Normal   Conjunctiva/Sclera White and quiet White and quiet   Cornea Clear Clear   Anterior Chamber Deep and quiet Deep and quiet   Iris Round and reactive Round and reactive   Lens Posterior chamber intraocular lens Posterior chamber intraocular lens   Anterior Vitreous Normal Normal       Fundus Exam      Right Left   Posterior Vitreous  Normal, clear, vitrectomized    Disc  Normal   C/D Ratio  0.2   Macula  Microaneurysms, no clinically significant macular edema   Vessels  PDR quiet   Periphery  good PRP          IMAGING AND PROCEDURES  Imaging and Procedures for 03/19/20  OCT, Retina - OU - Both Eyes       Right Eye Quality was good. Scan locations included subfoveal. Central Foveal Thickness: 282. Progression has been stable.   Left Eye Quality was good. Scan locations included subfoveal. Central Foveal Thickness: 308. Progression has been stable.   Notes Diffuse retinal atrophy, yet stable, no active CSME                ASSESSMENT/PLAN:  Controlled type 2 diabetes mellitus with stable proliferative retinopathy of both eyes, with long-term current use of insulin (HCC) The nature of regressed proliferative diabetic retinopathy was discussed with the patient. The patient was advised to maintain good glucose, blood pressure, monitor kidney function and serum lipid control  as advised by personal physician. Rare risk for reactivation of progression exist with untreated severe anemia, untreated renal failure, untreated heart failure, and smoking. Complete avoidance of smoking was recommended. The chance of recurrent proliferative diabetic retinopathy was discussed as well as the chance of vitreous hemorrhage for which further treatments may be necessary.   Explained to the patient that the quiescent  proliferative diabetic retinopathy disease is unlikely to ever worsen.  Worsening factors would include however severe anemia, hypertension out-of-control or impending renal failure.  Vitreous hemorrhage of left eye (HCC) Resolved, clear      ICD-10-CM   1. Proliferative diabetic retinopathy of left eye with macular edema associated with type 2 diabetes mellitus (HCC)  R51.8841 OCT, Retina - OU - Both Eyes  2. Controlled type 2 diabetes mellitus with stable proliferative retinopathy of both eyes, with long-term current use of insulin ([place])   Y60.6301    Z79.4   3. Vitreous hemorrhage of left eye (HCC)  H43.12     1.  Proliferative diabetic retinopathy OU is now stabilized.  No active disease visualized  2.  Vitreous hemorrhage in the past has cleared, will not deliver antivegF today.  Understands that microvascular disease could recur particularly with Valsalva maneuver or heavy lifting and trigger haziness in the vision and/or recurrence of vitreous hemorrhage at which time we may resume antivegF to clear the hemorrhage in this vitrectomized eyes OU  3.  Ophthalmic Meds Ordered this visit:  No orders of the defined types were placed in this encounter.      Return in about 6 months (around 09/16/2020) for DILATE OU, COLOR FP.  Patient Instructions  Patient instructed to contact the office promptly for new onset visual acuity decline or haziness of vision.    Explained the diagnoses, plan, and follow up with the patient and they expressed understanding.  Patient expressed understanding of the importance of proper follow up care.   Clent Demark Jaiya Mooradian M.D. Diseases & Surgery of the Retina and Vitreous [facility] 03/19/20     Abbreviations: M myopia (nearsighted); A astigmatism; H hyperopia (farsighted); P presbyopia; Mrx spectacle prescription;  CTL contact lenses; OD right eye; OS left eye; OU both eyes  XT exotropia; ET esotropia; PEK punctate epithelial keratitis; PEE punctate epithelial erosions; DES dry eye syndrome; MGD meibomian gland dysfunction; ATs artificial tears; PFAT's preservative free artificial tears; [place] nuclear sclerotic cataract; PSC posterior subcapsular cataract; ERM epi-retinal membrane; PVD posterior vitreous detachment; RD retinal detachment; DM diabetes mellitus; DR diabetic retinopathy; NPDR non-proliferative diabetic retinopathy; PDR proliferative diabetic retinopathy; CSME clinically significant macular edema; DME diabetic macular edema; dbh dot blot hemorrhages; CWS cotton wool  spot; POAG primary open angle glaucoma; C/D cup-to-disc ratio; HVF humphrey visual field; GVF goldmann visual field; OCT optical coherence tomography; IOP intraocular pressure; BRVO Branch retinal vein occlusion; CRVO central retinal vein occlusion; CRAO central retinal artery occlusion; BRAO branch retinal artery occlusion; RT retinal tear; SB scleral buckle; PPV pars plana vitrectomy; VH Vitreous hemorrhage; PRP panretinal laser photocoagulation; IVK intravitreal kenalog; VMT vitreomacular traction; MH Macular hole;  NVD neovascularization of the disc; NVE neovascularization elsewhere; AREDS age related eye disease study; ARMD age related macular degeneration; POAG primary open angle glaucoma; EBMD epithelial/anterior basement membrane dystrophy; ACIOL anterior chamber intraocular lens; IOL intraocular lens; PCIOL posterior chamber intraocular lens; Phaco/IOL phacoemulsification with intraocular lens placement; [place] photorefractive keratectomy; LASIK laser assisted in situ keratomileusis; HTN hypertension; DM diabetes mellitus; COPD chronic obstructive pulmonary disease

## 2020-03-19 NOTE — Patient Instructions (Signed)
Patient instructed to contact the office promptly for new onset visual acuity decline or haziness of vision.

## 2020-03-19 NOTE — Assessment & Plan Note (Signed)

## 2020-03-22 ENCOUNTER — Other Ambulatory Visit: Payer: Self-pay | Admitting: Cardiology

## 2020-03-24 DIAGNOSIS — Z23 Encounter for immunization: Secondary | ICD-10-CM | POA: Diagnosis not present

## 2020-03-26 ENCOUNTER — Other Ambulatory Visit: Payer: Self-pay

## 2020-03-26 ENCOUNTER — Ambulatory Visit (INDEPENDENT_AMBULATORY_CARE_PROVIDER_SITE_OTHER): Payer: Medicare Other | Admitting: Podiatry

## 2020-03-26 DIAGNOSIS — Z9861 Coronary angioplasty status: Secondary | ICD-10-CM

## 2020-03-26 DIAGNOSIS — I2510 Atherosclerotic heart disease of native coronary artery without angina pectoris: Secondary | ICD-10-CM

## 2020-03-26 DIAGNOSIS — M7742 Metatarsalgia, left foot: Secondary | ICD-10-CM | POA: Diagnosis not present

## 2020-03-26 DIAGNOSIS — L84 Corns and callosities: Secondary | ICD-10-CM

## 2020-03-26 DIAGNOSIS — M216X2 Other acquired deformities of left foot: Secondary | ICD-10-CM

## 2020-03-26 DIAGNOSIS — Z7901 Long term (current) use of anticoagulants: Secondary | ICD-10-CM | POA: Diagnosis not present

## 2020-03-28 ENCOUNTER — Other Ambulatory Visit: Payer: Self-pay | Admitting: Cardiology

## 2020-04-02 NOTE — Progress Notes (Signed)
Subjective:   Patient ID: Lance Collier, male   DOB: 74 y.o.   MRN: [id number]   HPI 74 year old male presents the office today for painful skin lesion on the left foot pointing to submetatarsal 5.  He states that he has had this about a year to become very painful.  He is previously to orthopedics and a pad was given to him which helped minimally.  He also saw dermatology and was given a cream without any helpful.  Also he saw his primary care physician who trimmed the area which helps some.  He feels that because of this pain he is having change in gait.  He does have heart disease and is on Plavix.  He denies any recent injury or trauma or stepping on any foreign objects.   Review of Systems  All other systems reviewed and are negative.  Past Medical History:  Diagnosis Date  . Anemia   . Aortic sclerosis  - without Stenosis  March 2014   Echo: Aortic sclerosis without stenosis. EF 55-60% with normal WM; mild concentric hypertrophy with normal relaxation.  . Arthritis   . CAD (coronary artery disease), autologous vein bypass graft 09/2014   10/03/14: Cath for Class III-IV Angina -- 80% ostial-proximal SVG-RPDA (minimal flow down native PDA from native RCA with 50% proximal and distal, patent LIMA-LAD, SVG-OM with retrograde filling of OM 2. --> 10/12/14: Staged PCI of SVG-RPDA - Xience DES  3.0 mm x 38 mm; (3.4  - 3.3 mm)  . CAD S/P percutaneous coronary angioplasty 06/12/2011; 09/2104   a) 2012: Complex PCI mRCA ([place]) --  2 overlapping Promus element DES stents.; 09/2014:  ost-prox SVG-rPDA - Xience DES 3.0 mm x 38 mm; (3.4  - 3.3 mm)  . CHF (congestive heart failure) ([place])   . Childhood asthma   . CKD (chronic kidney disease), stage III    "mild; related to diabetes" (10/02/2014)  . Complication of anesthesia    "I've had name recall recognition problems since my last anesthesia"  . DM (diabetes mellitus) type II uncontrolled with eye manifestation ([place]) 2013   Diabetic  retinopathy with retinal hemorrhages since;, recurrent in August 2014 treated with Avastin shots  . Dyspnea    mainly with exertion  . GERD (gastroesophageal reflux disease)   . Heart murmur    questionable  . High cholesterol   . Hypertension   . Migraine ~ 2004   "once"  . PAD (peripheral artery disease) ([facility])    with claudication  . PONV (postoperative nausea and vomiting)    s/p tonsillectomy  . 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 07/01/2013 (Cath for [place] Unstable Angina) --> [name]: For LM disease; LIMA-LAD, SVG-RCA, SCG-Cx; b. 09/2014 Cath/Staged PCI: patent LIMA->LAD, VG->OM1/RI, VG->RCA 80% (3.0x38 Xience Alpine DES on 10/12/2014).  . 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  Likely thromboembolic event with PDA occlusion. 06/12/2011   Post Complex PCI, pt states no mi  . Wears glasses    reading    Past Surgical History:  Procedure Laterality Date  . ABDOMINAL AORTOGRAM W/LOWER EXTREMITY N/A 02/05/2018   Procedure: ABDOMINAL AORTOGRAM W/LOWER EXTREMITY;  Surgeon: Elam Dutch, MD;  Location: [facility];  Service: Cardiovascular;  Laterality: N/A;  . COLONOSCOPY    . CORONARY ARTERY BYPASS GRAFT N/A 07/01/2013   Procedure: CORONARY ARTERY BYPASS GRAFTING (CABG);  Surgeon: Grace Isaac, MD;  Location: [facility];  Service: Open  Heart Surgery;  Laterality: N/A;  Times 3 using left internal mammary artery and endoscopically harvested bilateral saphenous vein  . ENDARTERECTOMY Left 05/21/2016   Procedure: LEFT CAROTID ENDARTERECTOMY;  Surgeon: Elam Dutch, MD;  Location: [facility];  Service: Vascular;  Laterality: Left;  . ENDARTERECTOMY FEMORAL Left 02/23/2018   Procedure: LEFT COMMON FEMORAL ENDARTERECTOMY;  Surgeon: Elam Dutch, MD;  Location: [place] [facility];  Service: Vascular;  Laterality: Left;  . EYE SURGERY Bilateral    ioc for cataracts  . INSERTION OF ILIAC STENT Bilateral 02/23/2018   Procedure: INSERTION OF  BILATERAL COMMON ILIAC STENTS WITH VIABAHN STENTS;  Surgeon: Elam Dutch, MD;  Location: [facility];  Service: Vascular;  Laterality: Bilateral;  . INTRAOPERATIVE TRANSESOPHAGEAL ECHOCARDIOGRAM N/A 07/01/2013   Procedure: INTRAOPERATIVE TRANSESOPHAGEAL ECHOCARDIOGRAM;  Surgeon: Grace Isaac, MD;  Location: [facility];  Service: Open Heart Surgery;  Laterality: N/A;  . LEFT AND RIGHT HEART CATHETERIZATION WITH CORONARY/GRAFT ANGIOGRAM N/A 10/03/2014   Procedure: LEFT AND RIGHT HEART CATHETERIZATION WITH Beatrix Fetters;  Surgeon: Leonie Man, MD;  Location: [facility] CATH LAB;  Service: Cardiovascular; severe prox RCA 80% (dRCA w/ competetive flow & 60% hazy ISR in Native RCA), patent LIMA-LAD  & SVG-OM1-RI - retrograde fills OM2, known native LM & mod OM2.  Severe Systemic HTN - LVEDP/PCWP 22 & 28 mmHg, Normal CO/CI  . LEFT HEART CATHETERIZATION WITH CORONARY ANGIOGRAM N/A 06/11/2011   Procedure: LEFT HEART CATHETERIZATION WITH CORONARY ANGIOGRAM;  Surgeon: Fulton Reek, MD;  Location: [place] CATH LAB;;  Dense LM & prox LAD, prox RCA calcification. Large bifurcating OM1 & small OM2 normal. Minor LAD & D1 dz, tortuous prox-mid RCA w/ 60% &  subtotal occluded dRCA w/ normal PDA & PLV   . LEFT HEART CATHETERIZATION WITH CORONARY ANGIOGRAM N/A 06/28/2013   Procedure: LEFT HEART CATHETERIZATION WITH CORONARY ANGIOGRAM;  Surgeon: Leonie Man, MD;  Location: [facility] CATH LAB;  Service: Cardiovascular;  ostial LM disese, RCA ~40-50%ISR  . NM MYOVIEW LTD  04/29/2018   LEXISCAN: Non-gated study with small fixed moderate intensity defect in the apical inferior wall suspicious for either small area of infarction versus attenuation (no change from previous study).  LOW RISK.   Marland Kitchen PATCH ANGIOPLASTY Left 05/21/2016   Procedure: PATCH ANGIOPLASTY USING HEMASHIELD PLATINUM FINESSE 0.3in x 3 in;  Surgeon: Elam Dutch, MD;  Location: [place];  Service: Vascular;  Laterality: Left;  . PATCH ANGIOPLASTY Left 02/23/2018    Procedure: PATCH ANGIOPLASTY LEFT COMMON FEMORAL ARTERY;  Surgeon: Elam Dutch, MD;  Location: [facility];  Service: Vascular;  Laterality: Left;  . PERCUTANEOUS CORONARY STENT INTERVENTION (PCI-S)  06/11/2011   Procedure: PERCUTANEOUS CORONARY STENT INTERVENTION (PCI-S);  Surgeon: Fulton Reek, MD;  Location: [facility] CATH LAB;  Service: Cardiovascular;;2 overlapping Promus DES 2.32mm at 12 mm x2; postdilated to 3 mm  . PERCUTANEOUS CORONARY STENT INTERVENTION (PCI-S) N/A 10/12/2014   Procedure: PERCUTANEOUS CORONARY STENT INTERVENTION (PCI-S);  Surgeon: Leonie Man, MD;  Location: [facility] CATH LAB;  Ostial-Prox SVG-RCA Xience Alpine DES 3.0 mm x 38 mm; (3.4  - 3.3 mm)  . RETINAL LASER PROCEDURE Bilateral    "more than once"   . TONSILLECTOMY  as child  . TRANSTHORACIC ECHOCARDIOGRAM  03/2017    EF 55-60%.  Normal thickness.  No R WMA..  GR 2 DD.  Mild-mod calcified aortic valve.  Mild RV systolic dysfunction.  Moderate RV dilation.  Moderately increased PA pressures.;;; 07/2019: EF 65%.  No WMA.  Unable to assess diastolic marginal RV PA pressures.  Mild to moderate LA dilation with moderate mild aortic valve sclerosis but no stenosis.     Current Outpatient Medications:  .  acetaminophen (TYLENOL) 325 MG tablet, Take 650 mg by mouth daily as needed for mild pain or headache. , Disp: , Rfl:  .  amLODipine (NORVASC) 10 MG tablet, Take 1 tablet by mouth daily., Disp: , Rfl:  .  aspirin EC 81 MG tablet, Take 1 tablet (81 mg total) by mouth every other day. (Patient taking differently: Take 81 mg by mouth See admin instructions. Take 81mg  once daily on Tues, Thurs, Sat, and Sun), Disp: , Rfl:  .  carvedilol (COREG) 12.5 MG tablet, TAKE 1 TABLET BY MOUTH TWICE DAILY., Disp: 180 tablet, Rfl: 3 .  clopidogrel (PLAVIX) 75 MG tablet, TAKE 1 TABLET DAILY WITH BREAKFAST., Disp: 90 tablet, Rfl: 3 .  Continuous Blood Gluc Sensor (FREESTYLE LIBRE 14 DAY SENSOR) MISC, MONITOR BS CONTINUOUSLY  CHANGE Q 14 DAYS.,  Disp: , Rfl:  .  docusate sodium (STOOL SOFTENER) 100 MG capsule, Take 100 mg by mouth daily as needed for mild constipation. , Disp: , Rfl:  .  Evolocumab (REPATHA SURECLICK) 852 MG/ML SOAJ, Inject 140 mg into the skin every 14 (fourteen) days., Disp: 2 pen, Rfl: 12 .  fenofibrate 160 MG tablet, TAKE 1 TABLET ONCE DAILY., Disp: 90 tablet, Rfl: 2 .  furosemide (LASIX) 80 MG tablet, Take 120mg  (1.5tablets) in the AM and 80mg  (1tablet) in the PM., Disp: , Rfl:  .  HUMALOG KWIKPEN 200 UNIT/ML SOPN, Inject 20-28 Units into the skin See admin instructions. Use 20 units (base) per meal increase units given by 1 unit for every 15 increment over 150 (I.E. Blood glucose reading 165=21 units, 180=22 units, etc. ), Disp: , Rfl: 5 .  hydrALAZINE (APRESOLINE) 100 MG tablet, TAKE 1 TABLET BY MOUTH 3 TIMES A DAY., Disp: 270 tablet, Rfl: 0 .  isosorbide mononitrate (IMDUR) 30 MG 24 hr tablet, Take 1 tablet (30 mg total) by mouth every evening. This is in addition to 60 mg in the morning., Disp: 90 tablet, Rfl: 3 .  isosorbide mononitrate (IMDUR) 60 MG 24 hr tablet, TAKE 1 TABLET ONCE DAILY., Disp: 90 tablet, Rfl: 0 .  ketoconazole (NIZORAL) 2 % cream, As directed, Disp: , Rfl:  .  pantoprazole (PROTONIX) 40 MG tablet, TAKE 1 TABLET ONCE DAILY., Disp: 90 tablet, Rfl: 3 .  rosuvastatin (CRESTOR) 10 MG tablet, TAKE 1 TABLET ON TUESDAY, THURSDAY, SATURDAY AND SUNDAY., Disp: 51 tablet, Rfl: 3 .  TOUJEO MAX SOLOSTAR 300 UNIT/ML SOPN, Inject 66 Units into the skin 2 (two) times daily. , Disp: , Rfl: 1 .  VASCEPA 1 G CAPS, Take 2 g by mouth 2 (two) times daily. , Disp: , Rfl: 4  Allergies  Allergen Reactions  . Atorvastatin Other (See Comments)    Leg pain  . Ibuprofen Hives  . Pravastatin Other (See Comments)    Leg pain  . Simvastatin Other (See Comments)    Leg pain        Objective:  Physical Exam  General: AAO x3, NAD  Dermatological: Mild diffuse hyperkeratotic tissue submetatarsal 5 left foot and  there is no underlying ulceration drainage or any signs of infection.  There is no evidence of foreign body, or verruca.  No hyperpigmented areas.  Vascular: Dorsalis Pedis artery and Posterior Tibial artery pedal pulses are 2/4 bilateral with immedate capillary fill time. There is no  pain with calf compression, swelling, warmth, erythema.   Neruologic: Grossly intact via light touch bilateral.   Musculoskeletal: Prominence of metatarsal heads plantarly with atrophy of the fat pad.  Tenderness submetatarsal 5 left foot.  Muscular strength 5/5 in all groups tested bilateral.  Gait: Unassisted, Nonantalgic.      Assessment:   Left foot prominent metatarsal heads resulted in painful skin lesion     Plan:  -Treatment options discussed including all alternatives, risks, and complications -Etiology of symptoms were discussed -Sharply debrided the skin lesion without any complications or bleeding.  Unfortunate I do think his symptoms is multifactorial I think part of his symptoms is the callus causing the pain but also the prominent metatarsal head.  After debriding the area also dispensed offloading pads.  Discussed shoe modifications and orthotics.  Ultimately can to consider surgical intervention if needed.  Trula Slade DPM

## 2020-04-11 ENCOUNTER — Ambulatory Visit: Payer: Medicare Other | Admitting: Cardiology

## 2020-04-17 ENCOUNTER — Ambulatory Visit (INDEPENDENT_AMBULATORY_CARE_PROVIDER_SITE_OTHER): Payer: Medicare Other | Admitting: Cardiology

## 2020-04-17 ENCOUNTER — Encounter: Payer: Self-pay | Admitting: Cardiology

## 2020-04-17 ENCOUNTER — Other Ambulatory Visit: Payer: Self-pay

## 2020-04-17 VITALS — BP 110/42 | HR 54 | Ht 68.5 in | Wt 208.8 lb

## 2020-04-17 DIAGNOSIS — Z794 Long term (current) use of insulin: Secondary | ICD-10-CM

## 2020-04-17 DIAGNOSIS — I5032 Chronic diastolic (congestive) heart failure: Secondary | ICD-10-CM | POA: Diagnosis not present

## 2020-04-17 DIAGNOSIS — Z951 Presence of aortocoronary bypass graft: Secondary | ICD-10-CM | POA: Diagnosis not present

## 2020-04-17 DIAGNOSIS — I2510 Atherosclerotic heart disease of native coronary artery without angina pectoris: Secondary | ICD-10-CM

## 2020-04-17 DIAGNOSIS — R600 Localized edema: Secondary | ICD-10-CM | POA: Diagnosis not present

## 2020-04-17 DIAGNOSIS — E785 Hyperlipidemia, unspecified: Secondary | ICD-10-CM

## 2020-04-17 DIAGNOSIS — I6523 Occlusion and stenosis of bilateral carotid arteries: Secondary | ICD-10-CM | POA: Diagnosis not present

## 2020-04-17 DIAGNOSIS — I6522 Occlusion and stenosis of left carotid artery: Secondary | ICD-10-CM

## 2020-04-17 DIAGNOSIS — E113553 Type 2 diabetes mellitus with stable proliferative diabetic retinopathy, bilateral: Secondary | ICD-10-CM | POA: Diagnosis not present

## 2020-04-17 DIAGNOSIS — I25709 Atherosclerosis of coronary artery bypass graft(s), unspecified, with unspecified angina pectoris: Secondary | ICD-10-CM | POA: Diagnosis not present

## 2020-04-17 DIAGNOSIS — I70219 Atherosclerosis of native arteries of extremities with intermittent claudication, unspecified extremity: Secondary | ICD-10-CM | POA: Diagnosis not present

## 2020-04-17 DIAGNOSIS — N184 Chronic kidney disease, stage 4 (severe): Secondary | ICD-10-CM | POA: Diagnosis not present

## 2020-04-17 DIAGNOSIS — I10 Essential (primary) hypertension: Secondary | ICD-10-CM | POA: Diagnosis not present

## 2020-04-17 NOTE — Patient Instructions (Signed)
Medication Instructions:   NO CHANGES CURRENT DOSING OF LASIX  ( FUROSEMIDE) 160 MG ( TOTAL  2 X 80 MG)  TWICE A DAY   MAY TAKE AN ADDITIONAL DOSE 80 MG IF YOU HAVE INCREASE SWELLING OR SHORTNESS OF BREATH WHILE LYING DOWN. *If you need a refill on your cardiac medications before your next appointment, please call your pharmacy*    Lab Work:  PLEASE ASK PRIMARY IF (CMP , LIPID) HAS BEEN DONE RECENT IF SO PLEASE HAVE LABS FAXED TO OFFICE.  If you have labs (blood work) drawn today and your tests are completely normal, you will receive your results only by: Marland Kitchen MyChart Message (if you have MyChart) OR . A paper copy in the mail If you have any lab test that is abnormal or we need to change your treatment, we will call you to review the results.   Testing/Procedures: NOT NEEDED   Follow-Up: At [facility], you and your health needs are our priority.  As part of our continuing mission to provide you with exceptional heart care, we have created designated Provider Care Teams.  These Care Teams include your primary Cardiologist (physician) and Advanced Practice Providers (APPs -  Physician Assistants and Nurse Practitioners) who all work together to provide you with the care you need, when you need it.    Your next appointment:    month(s) FEB 2022  The format for your next appointment:   In Person  Provider:   Glenetta Hew, MD   Other Instructions

## 2020-04-17 NOTE — Progress Notes (Signed)
Primary Care Provider: Reynold Bowen, MD Cardiologist: Glenetta Hew, MD Electrophysiologist: None  Clinic Note: Chief Complaint  Patient presents with  . Follow-up    6 months, pretty stable  . Coronary Artery Disease    No angina  . Congestive Heart Failure    Chronic diastolic, edema.  Stable.    HPI:    Lance Collier is a 74 y.o. male with a PMH notable for CAD (PCI followed by CABG then PCI to SVG-LCx and RCA), HFpEF, CKD-3, DM-2 with PN and retinopathy, severe PAD with limiting claudication, who presents today for essentially 58-month follow-up   MV CAD:   ? December 2012: 2 overlapping DES to RCA;  ? January 2015: CABG [facility], SVG-Cx);  ? April 2016 -DES PCI to SVG-RCA.  PAD -most recently left femoral endarterectomy with bilateral common iliac stenting and profundoplasty September 2019 -> unfortunately not much notable improvement in symptoms  Carotid Artery Disease - Left Carotid Endarterectomy November 8099  Chronic Diastolic Heart Failure  CKD 4  I saw him August 10, 2019 in follow-up exacerbation of HFpEF with worsening edema.  He was having orthopnea and PND as well as edema.  We notably increased his diuretic regimen and for short burst of Zaroxolyn brought his weight back down again.  At that visit he was doing fairly well.  Not quite back to baseline weight, but notably improved breathing.  October 17, 2019 by Jory Sims, NP.  This was a 1 month follow-up after presentation with some worsening edema-HFpEF symptoms.  Was not at this point interested in hemodialysis.  Diuretics have been managed by nephrology for the most part.  He has also declined Feraheme infusion.  Maintained on Lasix 160 mg twice daily. -> Was doing well.  Still noted by significant PAD symptoms.  No angina.  Mild DOE but mostly sedentary.  Lance Collier was last seen on Nov 13, 2019 doing relatively well from prior standpoint.  Still limited by leg claudication.  Able  to walk 2 to 3 minutes at a time without stopping.  He does get short of breath but usually occurs after having pain.  Edema pretty much gone.  Adjusting his Lasix accordingly.  Breathing is better.  Recent Hospitalizations: None  Reviewed  CV studies:    The following studies were reviewed today: (if available, images/films reviewed: From Epic Chart or Care Everywhere) . None   Interval History:   Lance Collier returns here today pretty much stable.  He is still extremely deconditioned worn out/fatigue.  Gets short of breath pretty easily but really limited by his bilateral claudication.  He really does not have any chest pain or pressure.  He is lost a little weight is due to try to watch his diet understanding that he can exercise.  He still has a pretty protuberant abdomen.  He has been very very tired of late and is on iron supplementation, recently had Feraheme infusion.  He says he is about 28 steps that he has to take up and down to get into the beach house and that just worn out.  He was extremely fatigued just doing the steps.  At home he has half that and is able to go up steps completely without stopping but then has to rest.  He also says that he could barely walk without getting short of breath and having claudication.  He is due to see nephrology soon expecting labs to be checked.  8 is clearly has  no intentions on dialysis.  His edema comes and goes, but is pretty well controlled now.  No PND or orthopnea.  He does not have any chest pain with exertion, only dyspnea.  No irregular heartbeats or palpitations, syncope or near syncope.   CV Review of Symptoms (Summary) Cardiovascular ROS: positive for - dyspnea on exertion and Well-controlled edema, stable lifestyle limiting claudication negative for - chest pain, irregular heartbeat, palpitations, paroxysmal nocturnal dyspnea, rapid heart rate, shortness of breath or Syncope/near syncope, TIA/amaurosis fugax,  claudication  The patient DOES NOT have symptoms concerning for COVID-19 infection (fever, chills, cough, or new shortness of breath).  The patient is practicing social distancing & Masking.   He has had both of his COVID-19 vaccines.   REVIEWED OF SYSTEMS   Review of Systems  Constitutional: Positive for malaise/fatigue (no energy; despite gettign IV Iron) and weight loss (wgt is up & down, but trying to lose weight).  HENT: Negative for nosebleeds.   Eyes:       --> His vision seems to doing better after his most recent injections with Dr. Zadie Rhine.  Able to see a little bit better with his left eye.  Respiratory: Positive for shortness of breath (with walking).   Cardiovascular: Positive for orthopnea (not often), claudication (Limiting ) and leg swelling (Stable).  Gastrointestinal: Negative for abdominal pain, blood in stool and melena.  Genitourinary: Positive for frequency (nocturia). Negative for dysuria and hematuria.  Musculoskeletal: Positive for joint pain and neck pain (He says he slept funny last night, and has had neck pain all day). Negative for falls and myalgias.  Neurological: Negative for dizziness (briegly orthotatic - nothing prolonged), focal weakness and weakness.  Endo/Heme/Allergies: Bruises/bleeds easily (with insulin shots).  Psychiatric/Behavioral: Negative for depression (dysthymia) and memory loss. The patient does not have insomnia (wakes up off & on, but able to go back to sleep; may or may not wake up for 30 min to 1hr at night - feels rested).    I have reviewed and (if needed) personally updated the patient's problem list, medications, allergies, past medical and surgical history, social and family history.   PAST MEDICAL HISTORY   Past Medical History:  Diagnosis Date  . Anemia   . Aortic sclerosis  - without Stenosis  March 2014   Echo: Aortic sclerosis without stenosis. EF 55-60% with normal WM; mild concentric hypertrophy with normal relaxation.   . Arthritis   . CAD (coronary artery disease), autologous vein bypass graft 09/2014   10/03/14: Cath for Class III-IV Angina -- 80% ostial-proximal SVG-RPDA (minimal flow down native PDA from native RCA with 50% proximal and distal, patent LIMA-LAD, SVG-OM with retrograde filling of OM 2. --> 10/12/14: Staged PCI of SVG-RPDA - Xience DES  3.0 mm x 38 mm; (3.4  - 3.3 mm)  . CAD S/P percutaneous coronary angioplasty 06/12/2011; 09/2104   a) 2012: Complex PCI mRCA ([place]) --  2 overlapping Promus element DES stents.; 09/2014:  ost-prox SVG-rPDA - Xience DES 3.0 mm x 38 mm; (3.4  - 3.3 mm)  . CHF (congestive heart failure) ([place])   . Childhood asthma   . CKD (chronic kidney disease), stage III ([place])    "mild; related to diabetes" (10/02/2014)  . Complication of anesthesia    "I've had name recall recognition problems since my last anesthesia"  . DM (diabetes mellitus) type II uncontrolled with eye manifestation ([place]) 2013   Diabetic retinopathy with retinal hemorrhages since;, recurrent in August 2014 treated with Avastin  shots  . Dyspnea    mainly with exertion  . GERD (gastroesophageal reflux disease)   . Heart murmur    questionable  . High cholesterol   . Hypertension   . Migraine ~ 2004   "once"  . PAD (peripheral artery disease) (HCC)    with claudication  . PONV (postoperative nausea and vomiting)    s/p tonsillectomy  . 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 07/01/2013 (Cath for Crescendo Unstable Angina) --> [name]: For LM disease; LIMA-LAD, SVG-RCA, SCG-Cx; b. 09/2014 Cath/Staged PCI: patent LIMA->LAD, VG->OM1/RI, VG->RCA 80% (3.0x38 Xience Alpine DES on 10/12/2014).  . 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  Likely thromboembolic event with PDA occlusion. 06/12/2011   Post Complex PCI, pt states no mi  . Wears glasses    reading    PAST SURGICAL HISTORY   Past Surgical History:  Procedure Laterality Date  . ABDOMINAL AORTOGRAM  W/LOWER EXTREMITY N/A 02/05/2018   Procedure: ABDOMINAL AORTOGRAM W/LOWER EXTREMITY;  Surgeon: Elam Dutch, MD;  Location: [place] [facility];  Service: Cardiovascular;  Laterality: N/A;  . COLONOSCOPY    . CORONARY ARTERY BYPASS GRAFT N/A 07/01/2013   Procedure: CORONARY ARTERY BYPASS GRAFTING (CABG);  Surgeon: Grace Isaac, MD;  Location: [place];  Service: Open Heart Surgery;  Laterality: N/A;  Times 3 using left internal mammary artery and endoscopically harvested bilateral saphenous vein  . ENDARTERECTOMY Left 05/21/2016   Procedure: LEFT CAROTID ENDARTERECTOMY;  Surgeon: Elam Dutch, MD;  Location: [place];  Service: Vascular;  Laterality: Left;  . ENDARTERECTOMY FEMORAL Left 02/23/2018   Procedure: LEFT COMMON FEMORAL ENDARTERECTOMY;  Surgeon: Elam Dutch, MD;  Location: [place];  Service: Vascular;  Laterality: Left;  . EYE SURGERY Bilateral    ioc for cataracts  . INSERTION OF ILIAC STENT Bilateral 02/23/2018   Procedure: INSERTION OF BILATERAL COMMON ILIAC STENTS WITH VIABAHN STENTS;  Surgeon: Elam Dutch, MD;  Location: [place];  Service: Vascular;  Laterality: Bilateral;  . INTRAOPERATIVE TRANSESOPHAGEAL ECHOCARDIOGRAM N/A 07/01/2013   Procedure: INTRAOPERATIVE TRANSESOPHAGEAL ECHOCARDIOGRAM;  Surgeon: Grace Isaac, MD;  Location: [place];  Service: Open Heart Surgery;  Laterality: N/A;  . LEFT AND RIGHT HEART CATHETERIZATION WITH CORONARY/GRAFT ANGIOGRAM N/A 10/03/2014   Procedure: LEFT AND RIGHT HEART CATHETERIZATION WITH Beatrix Fetters;  Surgeon: Leonie Man, MD;  Location: [facility] CATH LAB;  Service: Cardiovascular; severe prox RCA 80% (dRCA w/ competetive flow & 60% hazy ISR in Native RCA), patent LIMA-LAD  & SVG-OM1-RI - retrograde fills OM2, known native LM & mod OM2.  Severe Systemic HTN - LVEDP/PCWP 22 & 28 mmHg, Normal CO/CI  . LEFT HEART CATHETERIZATION WITH CORONARY ANGIOGRAM N/A 06/11/2011   Procedure: LEFT HEART CATHETERIZATION WITH CORONARY  ANGIOGRAM;  Surgeon: Fulton Reek, MD;  Location: [place] CATH LAB;;  Dense LM & prox LAD, prox RCA calcification. Large bifurcating OM1 & small OM2 normal. Minor LAD & D1 dz, tortuous prox-mid RCA w/ 60% &  subtotal occluded dRCA w/ normal PDA & PLV   . LEFT HEART CATHETERIZATION WITH CORONARY ANGIOGRAM N/A 06/28/2013   Procedure: LEFT HEART CATHETERIZATION WITH CORONARY ANGIOGRAM;  Surgeon: Leonie Man, MD;  Location: [facility] CATH LAB;  Service: Cardiovascular;  ostial LM disese, RCA ~40-50%ISR  . NM MYOVIEW LTD  04/29/2018   LEXISCAN: Non-gated study with small fixed moderate intensity defect in the apical inferior wall suspicious for either small area of infarction versus  attenuation (no change from previous study).  LOW RISK.   Marland Kitchen PATCH ANGIOPLASTY Left 05/21/2016   Procedure: PATCH ANGIOPLASTY USING HEMASHIELD PLATINUM FINESSE 0.3in x 3 in;  Surgeon: Elam Dutch, MD;  Location: [place];  Service: Vascular;  Laterality: Left;  . PATCH ANGIOPLASTY Left 02/23/2018   Procedure: PATCH ANGIOPLASTY LEFT COMMON FEMORAL ARTERY;  Surgeon: Elam Dutch, MD;  Location: [place];  Service: Vascular;  Laterality: Left;  . PERCUTANEOUS CORONARY STENT INTERVENTION (PCI-S)  06/11/2011   Procedure: PERCUTANEOUS CORONARY STENT INTERVENTION (PCI-S);  Surgeon: Fulton Reek, MD;  Location: [facility] CATH LAB;  Service: Cardiovascular;;2 overlapping Promus DES 2.53mm at 12 mm x2; postdilated to 3 mm  . PERCUTANEOUS CORONARY STENT INTERVENTION (PCI-S) N/A 10/12/2014   Procedure: PERCUTANEOUS CORONARY STENT INTERVENTION (PCI-S);  Surgeon: Leonie Man, MD;  Location: [facility] CATH LAB;  Ostial-Prox SVG-RCA Xience Alpine DES 3.0 mm x 38 mm; (3.4  - 3.3 mm)  . RETINAL LASER PROCEDURE Bilateral    "more than once"   . TONSILLECTOMY  as child  . TRANSTHORACIC ECHOCARDIOGRAM  03/2017    EF 55-60%.  Normal thickness.  No R WMA..  GR 2 DD.  Mild-mod calcified aortic valve.  Mild RV systolic dysfunction.  Moderate RV dilation.   Moderately increased PA pressures.;;; 07/2019: EF 65%.  No WMA.  Unable to assess diastolic marginal RV PA pressures.  Mild to moderate LA dilation with moderate mild aortic valve sclerosis but no stenosis.    MEDICATIONS/ALLERGIES   Current Meds  Medication Sig  . acetaminophen (TYLENOL) 325 MG tablet Take 650 mg by mouth daily as needed for mild pain or headache.   Marland Kitchen amLODipine (NORVASC) 10 MG tablet Take 1 tablet by mouth daily.  Marland Kitchen aspirin EC 81 MG tablet Take 81 mg by mouth 4 (four) times a week. Swallow whole.  . carvedilol (COREG) 12.5 MG tablet TAKE 1 TABLET BY MOUTH TWICE DAILY.  Marland Kitchen clopidogrel (PLAVIX) 75 MG tablet TAKE 1 TABLET DAILY WITH BREAKFAST.  Marland Kitchen Continuous Blood Gluc Sensor (FREESTYLE LIBRE 14 DAY SENSOR) MISC MONITOR BS CONTINUOUSLY  CHANGE Q 14 DAYS.  Marland Kitchen docusate sodium (STOOL SOFTENER) 100 MG capsule Take 100 mg by mouth daily as needed for mild constipation.   . Evolocumab (REPATHA SURECLICK) 433 MG/ML SOAJ Inject 140 mg into the skin every 14 (fourteen) days.  . fenofibrate 160 MG tablet TAKE 1 TABLET ONCE DAILY.  . furosemide (LASIX) 80 MG tablet Take 160 mg by mouth 2 (two) times daily. MAY TAKE AN EXTRA 80 MG IF NEEDED FOR INCREASE SWELLING OR SHORTNESS OF BREATH LYING DOWN.  Marland Kitchen HUMALOG KWIKPEN 200 UNIT/ML SOPN Inject 20-28 Units into the skin See admin instructions. Use 20 units (base) per meal increase units given by 1 unit for every 15 increment over 150 (I.E. Blood glucose reading 165=21 units, 180=22 units, etc. )  . hydrALAZINE (APRESOLINE) 100 MG tablet TAKE 1 TABLET BY MOUTH 3 TIMES A DAY.  . isosorbide mononitrate (IMDUR) 60 MG 24 hr tablet TAKE 1 TABLET ONCE DAILY.  Marland Kitchen ketoconazole (NIZORAL) 2 % cream As directed  . pantoprazole (PROTONIX) 40 MG tablet TAKE 1 TABLET ONCE DAILY.  . rosuvastatin (CRESTOR) 10 MG tablet TAKE 1 TABLET ON TUESDAY, THURSDAY, SATURDAY AND SUNDAY.  Marland Kitchen TOUJEO MAX SOLOSTAR 300 UNIT/ML SOPN Inject 66 Units into the skin 2 (two) times daily.    Marland Kitchen VASCEPA 1 G CAPS Take 2 g by mouth 2 (two) times daily.   . [DISCONTINUED] isosorbide  mononitrate (IMDUR) 30 MG 24 hr tablet Take 1 tablet (30 mg total) by mouth every evening. This is in addition to 60 mg in the morning.    Allergies  Allergen Reactions  . Atorvastatin Other (See Comments)    Leg pain  . Ibuprofen Hives  . Pravastatin Other (See Comments)    Leg pain  . Simvastatin Other (See Comments)    Leg pain    SOCIAL HISTORY/FAMILY HISTORY   Reviewed in Epic:  Pertinent findings: He has now retired.  Very much limited as far as activity goes. [place] to go to [employer].   OBJCTIVE -PE, EKG, labs   Wt Readings from Last 3 Encounters:  04/17/20 208 lb 12.8 oz (94.7 kg)  11/10/19 216 lb 6.4 oz (98.2 kg)  10/17/19 216 lb (98 kg)    Physical Exam: BP (!) 110/42   Pulse (!) 54   Ht 5' 8.5" (1.74 m)   Wt 208 lb 12.8 oz (94.7 kg)   BMI 31.29 kg/m  Physical Exam Vitals reviewed.  Constitutional:      General: He is not in acute distress.    Appearance: He is well-developed. He is obese. He is ill-appearing (Somewhat chronically ill-appearing, but nontoxic.).     Comments: Obese gentleman.  Well-groomed.   HENT:     Head: Normocephalic and atraumatic.  Neck:     Vascular: Carotid bruit (bilateral (vs. AS murmur radiated)) and hepatojugular reflux (Trivial) present. No JVD.     Comments: His neck is somewhat stiff today, but Cardiovascular:     Rate and Rhythm: Regular rhythm. Bradycardia present. Occasional extrasystoles are present.    Chest Wall: PMI is not displaced.     Pulses: Decreased pulses (Barely palpable pedal pulses).     Heart sounds: S1 normal. Heart sounds are distant. Murmur heard. High-pitched harsh crescendo-decrescendo midsystolic murmur is present with a grade of 2/6 at the upper right sternal border radiating to the neck.  Blowing holosystolic murmur of grade 1/6 is also present at the lower left sternal border and apex.   Gallop present.      Comments: Split S2 Pulmonary:     Effort: Pulmonary effort is normal. No respiratory distress.     Breath sounds: Normal breath sounds. No wheezing.  Chest:     Chest wall: No tenderness.  Abdominal:     General: Bowel sounds are normal. There is no distension.     Palpations: Abdomen is soft.     Tenderness: There is no abdominal tenderness.     Comments: Protuberant abdomen with rectal obesity.  Unable to assess HSM  Musculoskeletal:        General: Swelling (1+ bilateral ankle edema) present. Normal range of motion.     Cervical back: Full passive range of motion without pain, normal range of motion and neck supple.  Neurological:     General: No focal deficit present.     Mental Status: He is alert and oriented to person, place, and time.  Psychiatric:        Behavior: Behavior normal.        Thought Content: Thought content normal.        Judgment: Judgment normal.     Comments: His affect is still flat and mood is somewhat subdued.     Adult ECG Report  Rate: 54;  Rhythm: sinus bradycardia, premature atrial contractions (PAC), premature ventricular contractions (PVC) and 1 degree AVB.  LVH with repolarization  changes.  Otherwise normal axis, intervals and durations.;   Narrative Interpretation: Stable EKG.  Recent Labs: July 01, 2019: TC 228, TG 396, HDL 23, LDL 126. --> PCP ordered labs as of 9/26 --> he was started on Repatha by Dr. Forde Dandy.  Unfortunately he says his dosing is somewhat erratic as he will often forget. Lab Results  Component Value Date   CHOL 150 12/02/2018   HDL 23 (L) 12/02/2018   LDLCALC 59 12/02/2018   TRIG 341 (H) 12/02/2018   CHOLHDL 6.5 (H) 12/02/2018   Lab Results  Component Value Date   CREATININE 3.23 (H) 09/21/2019   BUN 56 (H) 09/21/2019   NA 139 09/21/2019   K 4.6 09/21/2019   CL 98 09/21/2019   CO2 24 09/21/2019   Lab Results  Component Value Date   TSH 4.660 (H) 04/13/2018    ASSESSMENT/PLAN     Problem List Items Addressed This Visit    Coronary artery disease involving coronary bypass graft with angina pectoris (HCC) (Chronic)   Relevant Medications   aspirin EC 81 MG tablet   furosemide (LASIX) 80 MG tablet   Other Relevant Orders   EKG 12-Lead (Completed)   S/P CABG (coronary artery bypass graft)  x 3, 07/01/13, LIMA-LAD; VG-RCA; VG- LCX (Chronic)    6-1/2 years out from CABG.  Last Myoview was in November 2019.  At this point if he remains stable, but probably wait till at least 2022 not 2023.      Relevant Orders   EKG 12-Lead (Completed)   Asymptomatic stenosis of left carotid artery without infarction (Chronic)    S/p CEA -follow-up with Dr. Oneida Alar      Relevant Medications   aspirin EC 81 MG tablet   furosemide (LASIX) 80 MG tablet   Chronic diastolic heart failure (HCC) (Chronic)    Patient with class II symptoms.  Exertional dyspnea but no real PND orthopnea.  Again pretty controlled.  We went over his Lasix dosing plan discussed sliding scale additional doses.  I think we may be of the time discussed with nephrology whether not we should switch to a different diuretic such as torsemide or bumetanide.  Also need to consider as needed Zaroxolyn.  He is taking very high doses.  Otherwise, he is on multiple medications for afterload reduction including amlodipine, hydralazine/nitrate (as opposed to ARB) along with his max tolerated dose of carvedilol (unable to titrate further due to bradycardia)  Blood pressure actually is pretty well controlled currently.      Relevant Medications   aspirin EC 81 MG tablet   furosemide (LASIX) 80 MG tablet   Other Relevant Orders   EKG 12-Lead (Completed)   Essential hypertension (Chronic)    Blood pressure looks good on current meds.  No change.      Relevant Medications   aspirin EC 81 MG tablet   furosemide (LASIX) 80 MG tablet   Hyperlipidemia with target LDL less than 70; intolerance to atorvastatin and  simvastatin (Chronic)    It was not able to tolerate any more than 10 mg of rosuvastatin.  Now started on Repatha.  Labs being followed by PCP.  Should be due soon. He is having hard time remembering to take his doses.  I told him that if he takes it 1 or 2 days off.  There is not that big a deal.  Even if he misses 1 dose resolved, it is okay to take it later. So we will see how he responds  to [place] with follow-up labs.      Relevant Medications   aspirin EC 81 MG tablet   furosemide (LASIX) 80 MG tablet   Left main coronary artery disease; with RCA in-stent restenosis (Chronic)    Significant native vessel disease with progression of disease from his initial PCI.  Referred for CABG. Last Myoview was November 2019.  As long as he is not having any progression of symptoms I think we can hold off until 2022 for 2023 if you would be interested at that time..      Relevant Medications   aspirin EC 81 MG tablet   furosemide (LASIX) 80 MG tablet   Edema of both legs (Chronic)    He probably has venostasis more so than CHF due to edema.  We talked about the elevation, support stockings.  Diuretic dosing per nephrology.      Chronic kidney disease (CKD), stage IV (severe) (HCC) (Chronic)    Followed by nephrology.  He has no intention of undergoing dialysis.  Last for creatinine I have on him is 3.28.  We will defer diuretic adjustment to nephrology.  I had tried to start as needed metolazone, but this is no longer listed.      Atherosclerotic peripheral vascular disease with intermittent claudication (HCC) - Primary (Chronic)    Significant PAD, also follow-up Dr. Oneida Alar. .. On aggressive cardiovascular risk factor modification.      Relevant Medications   aspirin EC 81 MG tablet   furosemide (LASIX) 80 MG tablet   Controlled type 2 diabetes mellitus with stable proliferative retinopathy of both eyes, with long-term current use of insulin (HCC) (Chronic)    Diabetes management after  self.  Visual issues managed by Dr. Zadie Rhine.  With renal insufficiency discharge, could consider SGLT2 inhibitor, will defer to Dr. Forde Dandy.      Relevant Medications   aspirin EC 81 MG tablet   Bilateral carotid artery occlusion   Relevant Medications   aspirin EC 81 MG tablet   furosemide (LASIX) 80 MG tablet     .  Overall, unstable cardiac regimen.  No new changes need to be made.  We did simply talked about sliding scale Lasix.   COVID-19 Education: The signs and symptoms of COVID-19 were discussed with the patient and how to seek care for testing (follow up with PCP or arrange E-visit).   The importance of social distancing and COVID-19 vaccination was discussed today.  I spent a total of 33 minutes with the patient. >  50% of the time was spent in direct patient consultation.  Additional time spent with chart review  / charting (studies, outside notes, etc): 10 Total Time: 1min  Current medicines are reviewed at length with the patient today.  (+/- concerns) N/A  Notice: This dictation was prepared with Dragon dictation along with smaller phrase technology. Any transcriptional errors that result from this process are unintentional and may not be corrected upon review.  Patient Instructions / Medication Changes & Studies & Tests Ordered   Patient Instructions  Medication Instructions:   NO CHANGES CURRENT DOSING OF LASIX  ( FUROSEMIDE) 160 MG ( TOTAL  2 X 80 MG)  TWICE A DAY   MAY TAKE AN ADDITIONAL DOSE 80 MG IF YOU HAVE INCREASE SWELLING OR SHORTNESS OF BREATH WHILE LYING DOWN. *If you need a refill on your cardiac medications before your next appointment, please call your pharmacy*    Lab Work:  PLEASE ASK PRIMARY IF (CMP , LIPID) HAS  BEEN DONE RECENT IF SO PLEASE HAVE LABS FAXED TO OFFICE.  If you have labs (blood work) drawn today and your tests are completely normal, you will receive your results only by: Marland Kitchen MyChart Message (if you have MyChart) OR . A paper copy in  the mail If you have any lab test that is abnormal or we need to change your treatment, we will call you to review the results.   Testing/Procedures: NOT NEEDED   Follow-Up: At [facility], you and your health needs are our priority.  As part of our continuing mission to provide you with exceptional heart care, we have created designated Provider Care Teams.  These Care Teams include your primary Cardiologist (physician) and Advanced Practice Providers (APPs -  Physician Assistants and Nurse Practitioners) who all work together to provide you with the care you need, when you need it.    Your next appointment:    month(s) FEB 2022  The format for your next appointment:   In Person  Provider:   Glenetta Hew, MD   Other Instructions     Studies Ordered:   Orders Placed This Encounter  Procedures  . EKG 12-Lead     Glenetta Hew, M.D., M.S. Interventional Cardiologist   Pager # [phone] Phone # [phone] [address] [place], [place] [address]   Thank you for choosing [facility]!!

## 2020-04-19 ENCOUNTER — Other Ambulatory Visit: Payer: Self-pay | Admitting: Cardiology

## 2020-04-26 ENCOUNTER — Encounter: Payer: Self-pay | Admitting: Cardiology

## 2020-04-26 DIAGNOSIS — Z794 Long term (current) use of insulin: Secondary | ICD-10-CM | POA: Diagnosis not present

## 2020-04-26 DIAGNOSIS — E1165 Type 2 diabetes mellitus with hyperglycemia: Secondary | ICD-10-CM | POA: Diagnosis not present

## 2020-04-26 DIAGNOSIS — N184 Chronic kidney disease, stage 4 (severe): Secondary | ICD-10-CM | POA: Diagnosis not present

## 2020-04-26 DIAGNOSIS — I10 Essential (primary) hypertension: Secondary | ICD-10-CM | POA: Diagnosis not present

## 2020-04-26 NOTE — Assessment & Plan Note (Signed)
Blood pressure looks good on current meds.  No change.

## 2020-04-26 NOTE — Assessment & Plan Note (Signed)
It was not able to tolerate any more than 10 mg of rosuvastatin.  Now started on Repatha.  Labs being followed by PCP.  Should be due soon. He is having hard time remembering to take his doses.  I told him that if he takes it 1 or 2 days off.  There is not that big a deal.  Even if he misses 1 dose resolved, it is okay to take it later. So we will see how he responds to [place] with follow-up labs.

## 2020-04-26 NOTE — Assessment & Plan Note (Signed)
6-1/2 years out from CABG.  Last Myoview was in November 2019.  At this point if he remains stable, but probably wait till at least 2022 not 2023.

## 2020-04-26 NOTE — Assessment & Plan Note (Signed)
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  We talked about the elevation, support stockings.  Diuretic dosing per nephrology.

## 2020-04-26 NOTE — Assessment & Plan Note (Signed)
Significant native vessel disease with progression of disease from his initial PCI.  Referred for CABG. Last Myoview was November 2019.  As long as he is not having any progression of symptoms I think we can hold off until 2022 for 2023 if you would be interested at that time.Lance Collier

## 2020-04-26 NOTE — Assessment & Plan Note (Signed)
Diabetes management after self.  Visual issues managed by Dr. Zadie Rhine.  With renal insufficiency discharge, could consider SGLT2 inhibitor, will defer to Dr. Forde Dandy.

## 2020-04-26 NOTE — Assessment & Plan Note (Signed)
Significant PAD, also follow-up Dr. Oneida Alar. .. On aggressive cardiovascular risk factor modification.

## 2020-04-26 NOTE — Assessment & Plan Note (Signed)
Followed by nephrology.  He has no intention of undergoing dialysis.  Last for creatinine I have on him is 3.28.  We will defer diuretic adjustment to nephrology.  I had tried to start as needed metolazone, but this is no longer listed.

## 2020-04-26 NOTE — Assessment & Plan Note (Addendum)
Patient with class II symptoms.  Exertional dyspnea but no real PND orthopnea.  Again pretty controlled.  We went over his Lasix dosing plan discussed sliding scale additional doses.  I think we may be of the time discussed with nephrology whether not we should switch to a different diuretic such as torsemide or bumetanide.  Also need to consider as needed Zaroxolyn.  He is taking very high doses.  Otherwise, he is on multiple medications for afterload reduction including amlodipine, hydralazine/nitrate (as opposed to ARB) along with his max tolerated dose of carvedilol (unable to titrate further due to bradycardia)  Blood pressure actually is pretty well controlled currently.

## 2020-04-26 NOTE — Assessment & Plan Note (Signed)
S/p CEA -follow-up with Dr. Oneida Alar

## 2020-04-30 ENCOUNTER — Ambulatory Visit (INDEPENDENT_AMBULATORY_CARE_PROVIDER_SITE_OTHER): Payer: Medicare Other | Admitting: Podiatry

## 2020-04-30 ENCOUNTER — Other Ambulatory Visit: Payer: Self-pay

## 2020-04-30 DIAGNOSIS — M7742 Metatarsalgia, left foot: Secondary | ICD-10-CM

## 2020-04-30 DIAGNOSIS — I6523 Occlusion and stenosis of bilateral carotid arteries: Secondary | ICD-10-CM | POA: Diagnosis not present

## 2020-04-30 DIAGNOSIS — L84 Corns and callosities: Secondary | ICD-10-CM

## 2020-05-02 NOTE — Progress Notes (Signed)
Subjective: 74 year old male presents the office today for follow-up evaluation of left foot pain.  He states he is seeing much improvement compared to last appointment he is not having any pain.  Denies any open sores.  He has no other concerns today. Denies any systemic complaints such as fevers, chills, nausea, vomiting. No acute changes since last appointment, and no other complaints at this time.   Objective: AAO x3, NAD DP/PT pulses palpable bilaterally, CRT less than 3 seconds Very minimal hyperkeratotic tissue left foot submetatarsal 5 and there is no underlying ulceration drainage or signs of infection.  No other open lesions identified or preulcerative calluses.  Prominence of metatarsal heads plantarly with atrophy of the fat pad.  Chronic edema present bilateral lower extremities which he reports is unchanged. No pain with calf compression, warmth, erythema  Assessment: Prominent metatarsal heads/metatarsalgia; hyperkeratotic lesion  Plan: -All treatment options discussed with the patient including all alternatives, risks, complications.  -Lightly debrided the hyperkeratotic tissue on the left foot without any complications or bleeding this was minimal.  Continue offloading pads.  Discussed shoe modifications discussed making him an accommodative orthotic if needed. -Patient encouraged to call the office with any questions, concerns, change in symptoms.   Trula Slade DPM

## 2020-05-06 ENCOUNTER — Other Ambulatory Visit: Payer: Self-pay | Admitting: Cardiology

## 2020-05-16 DIAGNOSIS — N184 Chronic kidney disease, stage 4 (severe): Secondary | ICD-10-CM | POA: Diagnosis not present

## 2020-05-16 DIAGNOSIS — I739 Peripheral vascular disease, unspecified: Secondary | ICD-10-CM | POA: Diagnosis not present

## 2020-05-16 DIAGNOSIS — D649 Anemia, unspecified: Secondary | ICD-10-CM | POA: Diagnosis not present

## 2020-05-16 DIAGNOSIS — M79676 Pain in unspecified toe(s): Secondary | ICD-10-CM | POA: Diagnosis not present

## 2020-05-16 DIAGNOSIS — I129 Hypertensive chronic kidney disease with stage 1 through stage 4 chronic kidney disease, or unspecified chronic kidney disease: Secondary | ICD-10-CM | POA: Diagnosis not present

## 2020-05-16 DIAGNOSIS — E1122 Type 2 diabetes mellitus with diabetic chronic kidney disease: Secondary | ICD-10-CM | POA: Diagnosis not present

## 2020-05-16 DIAGNOSIS — R609 Edema, unspecified: Secondary | ICD-10-CM | POA: Diagnosis not present

## 2020-05-16 DIAGNOSIS — I2510 Atherosclerotic heart disease of native coronary artery without angina pectoris: Secondary | ICD-10-CM | POA: Diagnosis not present

## 2020-06-05 DIAGNOSIS — L820 Inflamed seborrheic keratosis: Secondary | ICD-10-CM | POA: Diagnosis not present

## 2020-06-05 DIAGNOSIS — X32XXXD Exposure to sunlight, subsequent encounter: Secondary | ICD-10-CM | POA: Diagnosis not present

## 2020-06-05 DIAGNOSIS — L728 Other follicular cysts of the skin and subcutaneous tissue: Secondary | ICD-10-CM | POA: Diagnosis not present

## 2020-06-05 DIAGNOSIS — L570 Actinic keratosis: Secondary | ICD-10-CM | POA: Diagnosis not present

## 2020-06-06 ENCOUNTER — Other Ambulatory Visit: Payer: Self-pay | Admitting: Cardiology

## 2020-06-06 NOTE — Telephone Encounter (Signed)
Rx has been sent to the pharmacy electronically. ° °

## 2020-06-21 ENCOUNTER — Other Ambulatory Visit: Payer: Self-pay | Admitting: Cardiology

## 2020-07-02 DIAGNOSIS — I5042 Chronic combined systolic (congestive) and diastolic (congestive) heart failure: Secondary | ICD-10-CM | POA: Diagnosis not present

## 2020-07-02 DIAGNOSIS — N184 Chronic kidney disease, stage 4 (severe): Secondary | ICD-10-CM | POA: Diagnosis not present

## 2020-07-02 DIAGNOSIS — D631 Anemia in chronic kidney disease: Secondary | ICD-10-CM | POA: Diagnosis not present

## 2020-07-02 DIAGNOSIS — E785 Hyperlipidemia, unspecified: Secondary | ICD-10-CM | POA: Diagnosis not present

## 2020-07-02 DIAGNOSIS — I6529 Occlusion and stenosis of unspecified carotid artery: Secondary | ICD-10-CM | POA: Diagnosis not present

## 2020-07-02 DIAGNOSIS — E113553 Type 2 diabetes mellitus with stable proliferative diabetic retinopathy, bilateral: Secondary | ICD-10-CM | POA: Diagnosis not present

## 2020-07-02 DIAGNOSIS — M10479 Other secondary gout, unspecified ankle and foot: Secondary | ICD-10-CM | POA: Diagnosis not present

## 2020-07-02 DIAGNOSIS — I10 Essential (primary) hypertension: Secondary | ICD-10-CM | POA: Diagnosis not present

## 2020-07-02 DIAGNOSIS — E211 Secondary hyperparathyroidism, not elsewhere classified: Secondary | ICD-10-CM | POA: Diagnosis not present

## 2020-07-02 DIAGNOSIS — R946 Abnormal results of thyroid function studies: Secondary | ICD-10-CM | POA: Diagnosis not present

## 2020-07-02 DIAGNOSIS — E1151 Type 2 diabetes mellitus with diabetic peripheral angiopathy without gangrene: Secondary | ICD-10-CM | POA: Diagnosis not present

## 2020-07-02 DIAGNOSIS — I739 Peripheral vascular disease, unspecified: Secondary | ICD-10-CM | POA: Diagnosis not present

## 2020-07-03 ENCOUNTER — Other Ambulatory Visit: Payer: Self-pay | Admitting: Cardiology

## 2020-07-24 ENCOUNTER — Other Ambulatory Visit: Payer: Self-pay | Admitting: Cardiology

## 2020-07-25 ENCOUNTER — Encounter: Payer: Self-pay | Admitting: Cardiology

## 2020-07-25 ENCOUNTER — Ambulatory Visit (INDEPENDENT_AMBULATORY_CARE_PROVIDER_SITE_OTHER): Payer: Medicare Other | Admitting: Cardiology

## 2020-07-25 ENCOUNTER — Other Ambulatory Visit: Payer: Self-pay

## 2020-07-25 VITALS — BP 126/58 | HR 60 | Ht 68.0 in | Wt 197.0 lb

## 2020-07-25 DIAGNOSIS — N184 Chronic kidney disease, stage 4 (severe): Secondary | ICD-10-CM

## 2020-07-25 DIAGNOSIS — I25709 Atherosclerosis of coronary artery bypass graft(s), unspecified, with unspecified angina pectoris: Secondary | ICD-10-CM

## 2020-07-25 DIAGNOSIS — E669 Obesity, unspecified: Secondary | ICD-10-CM

## 2020-07-25 DIAGNOSIS — I10 Essential (primary) hypertension: Secondary | ICD-10-CM

## 2020-07-25 DIAGNOSIS — I70219 Atherosclerosis of native arteries of extremities with intermittent claudication, unspecified extremity: Secondary | ICD-10-CM

## 2020-07-25 DIAGNOSIS — I2510 Atherosclerotic heart disease of native coronary artery without angina pectoris: Secondary | ICD-10-CM | POA: Diagnosis not present

## 2020-07-25 DIAGNOSIS — R946 Abnormal results of thyroid function studies: Secondary | ICD-10-CM | POA: Diagnosis not present

## 2020-07-25 DIAGNOSIS — E785 Hyperlipidemia, unspecified: Secondary | ICD-10-CM | POA: Diagnosis not present

## 2020-07-25 DIAGNOSIS — R600 Localized edema: Secondary | ICD-10-CM | POA: Diagnosis not present

## 2020-07-25 DIAGNOSIS — Z951 Presence of aortocoronary bypass graft: Secondary | ICD-10-CM

## 2020-07-25 DIAGNOSIS — I5032 Chronic diastolic (congestive) heart failure: Secondary | ICD-10-CM | POA: Diagnosis not present

## 2020-07-25 DIAGNOSIS — Z9861 Coronary angioplasty status: Secondary | ICD-10-CM

## 2020-07-25 NOTE — Progress Notes (Signed)
Primary Care Provider: Reynold Bowen, MD Cardiologist: Glenetta Hew, MD Electrophysiologist: None  Clinic Note: Chief Complaint  Patient presents with  . Follow-up  . Shortness of Breath  . Edema    Feet, legs, and ankles.  . Coronary Artery Disease    No angina  . PAD    Limiting claudication, stable    ASSESSMENT/PLAN   Coronary artery disease involving coronary bypass graft with angina pectoris ([place]) 7 years out from CABG.  6 years out from PCI.  No anginal symptoms.  Was able to shovel snow without angina.  He did have dyspnea, but no angina.. Echo with normal EF.  Myoview in 2019 was nonischemic.  Low risk.  Will be due for follow-up in late 2013  Plan:  On combination of carvedilol and amlodipine along with Imdur for antianginal benefit.  Also on hydralazine for afterload reduction along with Imdur.  Not on ACE inhibitor or ARB because of CKD IV  On fenofibrate and rosuvastatin.  Only able to tolerate rosuvastatin 4 days a week.  Also on Vascepa.  -> Has been started on Repatha.  Chronic diastolic heart failure (HCC) Stable NYHA Class II symptoms, but is activities limited more by claudication.  He has exertional dyspnea and intermittent swelling which is as much related to venous stasis than anything else-no PND orthopnea.  EF is well-preserved, but likely still has elevated filling pressures.  He is on hydralazine-Imdur at high doses for afterload reduction (hydralazine 100 mg 3 times daily along with Imdur 90 mg-in lieu of ACE inhibitor/ARB because of renal failure); also on amlodipine.  On max tolerated dose of carvedilol (heart rate 60 bpm)  Remains on high doses of diuretic-furosemide 160 mg twice daily with additional 80 mg as needed.  Nephrology is indicated that they would not want [place] to use Zaroxolyn (we did use it in an urgent situation last year).  He has not had any further exacerbations since April of last year.   Essential  hypertension Stable blood pressure on current meds.  No change.  CAD S/P PCI SVG-RCA Xience Alpine DES 3.0 mm x 38 mm (ostial-prox) - 3.76mm PCI to SVG-RCA in 2016.  With his significant CAD, PAD and carotid disease, he remain on lifelong clopidogrel.  Plan:  Okay to hold clopidogrel/Plavix for 5 to 7 days preop for surgeries or procedures.    Aggressive lipid management with combination of Repatha, intermittent Crestor, fibrate and Vascepa  On combination of calcium channel blocker, beta-blocker and combination hydralazine/Imdur  Hyperlipidemia with target LDL less than 70; intolerance to atorvastatin and simvastatin LDL is wonderful at 59 on combination of 4 medications: Repatha, intermittent Crestor, Vascepa and fenofibrate  Despite being on Vascepa and fenofibrate, his triglycerides are still elevated at 341 => likely related diabetes and dietary indiscretion.  Discussed importance of monitoring his diet.  Unfortunately HDL is still down to 23.  This is probably partly related to overall total cholesterol reduction.  He is not able to exercise, and therefore will not build to increase.  Atherosclerotic peripheral vascular disease with intermittent claudication (HCC) Perhaps the most limiting feature is his PAD.  Continue to recommend walking as much as possible.  Following up with Dr. Oneida Alar.  Is on aggressive cardiovascular risk modification.  No interventional options left.  Last ABIs showed improvement in flow.  Obesity (BMI 30-39.9) He clearly has made a conscious effort of adjusting his diet and is exercising more.  He has lost at least another 10 pounds.  This is really helped his blood pressure as well as his dyspnea.  Edema is essentially resolved.  He feels better overall.  He is now officially below the "obesity range ".  I congratulated his efforts and encouraged him to continue to lose.  Chronic kidney disease (CKD), stage IV (severe) ([place]) Followed by nephrology.   Creatinine is remained stable at about 3.2.  This precludes interventional options for either peripheral or coronary vasculature.  Per nephrology, would prefer not using metolazone.  Simply increasing the dose of Lasix. Not on ACE inhibitor or ARB-using hydralazine/Imdur for afterload reduction.  He is indicated with no uncertainty that he will not go on dialysis.  Edema of both legs I think the clearly has some venous stasis as much as CHF leading to his edema.  With the weight loss, the edema is notably improved.  We talked about support stockings and foot elevation.   ================================================  HPI:    Lance Collier is a 75 y.o. male with a PMH notable for CAD (PCI followed by CABG then PCI to SVG-LCx and RCA), HFpEF, CKD-3, DM-2 with PN and retinopathy, severe PAD with limiting claudication, who presents today for essentially 36-month follow-up   MV CAD:   ? December 2012: 2 overlapping DES to RCA;  ? January 2015: CABG [facility], SVG-Cx);  ? April 2016 -DES PCI to SVG-RCA.  PAD -most recently left femoral endarterectomy with bilateral common iliac stenting and profundoplasty September 2019 -> unfortunately not much notable improvement in symptoms => this is his main limiting feature=> able to walk maybe 2 to 3 minutes without stopping.  Carotid Artery Disease - Left Carotid Endarterectomy November 6720  Chronic Diastolic Heart Failure (HFpEF)->   Notable episode in Feb 2021.  Treated with Zaroxolyn plus furosemide at higher doses.  October 17, 2019-seen by Jory Sims, NP-diuretics have been readjusted by nephrology.  He indicated he was not interested in [place] for anemia, and was adamant about no dialysis.  Was maintained on Lasix 160 mg twice daily.  Noted mild DOE but sedentary.  No angina.  Limited by PAD.  As of May 2021-18 was Promus resolved.  Breathing improved.  CKD 4   Lance Collier was last seen on April 17, 2020-he is  pretty stable.  Still very deconditioned, worn out, fatigue.  Limited by claudication.  No chest pain or pressure.  Has been losing weight (watching his diet and trying to do what he could for exercise).  Was on oral iron supplement and finally ended up going for Feraheme.  Noted getting very worn out walking up the 28 steps [place].  But was able to get up the 14 steps to his [place] home.  Said that his leg edema comes and goes but pretty well controlled.  No PND orthopnea.=> Noted his eyes and stabilize little bit with recent injections. => Reiterated sliding scale Lasix we can take an extra 80 mg dose if necessary.  Recent Hospitalizations: None  Reviewed  CV studies:    The following studies were reviewed today: (if available, images/films reviewed: From Epic Chart or Care Everywhere) . 06/23/2019: ABIs: R ABI 0.86, L ABI 0.52.  (Previous 0.59, 0.36) R TBI 0.45, L TBI 0.21.  Improved right-sided effusion.  Interval History:   Lance Collier returns here today pretty much stable.  He is lost another 10 pounds and he says his breathing is notably improved -> sleeps on one pillow.  No orthopnea or PND.Marland Kitchen  His  swelling is very much improved.  He still has claudication that limits his walking, but he is able to do a little bit further walking than before.  He only gets short of breath walking up and down steps but is not as hard for him to do as he had been.  He is more limited by leg pain.  This dyspnea has improved with his Feraheme infusions.  He did get pretty winded and worn out trying to shovel snow, but still tried.  No chest pain or pressure.   CV Review of Symptoms (Summary) Cardiovascular ROS: positive for - dyspnea on exertion, edema and Well-controlled edema-but it comes and goes.  Has not been taking extra doses of Lasix, stable lifestyle limiting claudication negative for - chest pain, irregular heartbeat, orthopnea, palpitations, paroxysmal nocturnal dyspnea, rapid  heart rate, shortness of breath or Syncope/near syncope, TIA/amaurosis fugax, claudication  The patient does not have symptoms concerning for COVID-19 infection (fever, chills, cough, or new shortness of breath).   REVIEWED OF SYSTEMS   Review of Systems  Constitutional: Positive for malaise/fatigue (no energy; despite gettign IV Iron) and weight loss (wgt is up & down, but trying to lose weight).  HENT: Negative for nosebleeds.   Eyes:       --> His vision seems to doing better after his most recent injections with Dr. Zadie Rhine.  Able to see a little bit better with his left eye.  Respiratory: Positive for shortness of breath (with walking).   Cardiovascular: Positive for orthopnea (not often), claudication (Limiting ) and leg swelling (Stable).  Gastrointestinal: Negative for abdominal pain, blood in stool and melena.  Genitourinary: Positive for frequency (nocturia). Negative for dysuria and hematuria.  Musculoskeletal: Positive for joint pain and neck pain (He says he slept funny last night, and has had neck pain all day). Negative for falls and myalgias.  Neurological: Negative for dizziness (briegly orthotatic - nothing prolonged), focal weakness and weakness.  Endo/Heme/Allergies: Bruises/bleeds easily (with insulin shots).  Psychiatric/Behavioral: Negative for depression (dysthymia) and memory loss. The patient does not have insomnia (wakes up off & on, but able to go back to sleep; may or may not wake up for 30 min to 1hr at night - feels rested).    I have reviewed and (if needed) personally updated the patient's problem list, medications, allergies, past medical and surgical history, social and family history.   PAST MEDICAL HISTORY   Past Medical History:  Diagnosis Date  . Anemia   . Aortic sclerosis  - without Stenosis  March 2014   Echo: Aortic sclerosis without stenosis. EF 55-60% with normal WM; mild concentric hypertrophy with normal relaxation.  . Arthritis   . CAD  (coronary artery disease), autologous vein bypass graft 09/2014   10/03/14: Cath for Class III-IV Angina -- 80% ostial-proximal SVG-RPDA (minimal flow down native PDA from native RCA with 50% proximal and distal, patent LIMA-LAD, SVG-OM with retrograde filling of OM 2. --> 10/12/14: Staged PCI of SVG-RPDA - Xience DES  3.0 mm x 38 mm; (3.4  - 3.3 mm)  . CAD S/P percutaneous coronary angioplasty 06/12/2011; 09/2104   a) 2012: Complex PCI mRCA ([place]) --  2 overlapping Promus element DES stents.; 09/2014:  ost-prox SVG-rPDA - Xience DES 3.0 mm x 38 mm; (3.4  - 3.3 mm)  . CHF (congestive heart failure) ([place])   . Childhood asthma   . CKD (chronic kidney disease), stage III ([place])    "mild; related to diabetes" (10/02/2014)  .  Complication of anesthesia    "I've had name recall recognition problems since my last anesthesia"  . DM (diabetes mellitus) type II uncontrolled with eye manifestation ([place]) 2013   Diabetic retinopathy with retinal hemorrhages since;, recurrent in August 2014 treated with Avastin shots  . Dyspnea    mainly with exertion  . GERD (gastroesophageal reflux disease)   . Heart murmur    questionable  . High cholesterol   . Hypertension   . Migraine ~ 2004   "once"  . PAD (peripheral artery disease) (HCC)    with claudication  . PONV (postoperative nausea and vomiting)    s/p tonsillectomy  . 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 07/01/2013 (Cath for [place] Unstable Angina) --> [name]: For LM disease; LIMA-LAD, SVG-RCA, SCG-Cx; b. 09/2014 Cath/Staged PCI: patent LIMA->LAD, VG->OM1/RI, VG->RCA 80% (3.0x38 Xience Alpine DES on 10/12/2014).  . 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  Likely thromboembolic event with PDA occlusion. 06/12/2011   Post Complex PCI, pt states no mi  . Wears glasses    reading    PAST SURGICAL HISTORY   Past Surgical History:  Procedure Laterality Date  . ABDOMINAL AORTOGRAM W/LOWER EXTREMITY N/A  02/05/2018   Procedure: ABDOMINAL AORTOGRAM W/LOWER EXTREMITY;  Surgeon: Elam Dutch, MD;  Location: [facility];  Service: Cardiovascular;  Laterality: N/A;  . COLONOSCOPY    . CORONARY ARTERY BYPASS GRAFT N/A 07/01/2013   Procedure: CORONARY ARTERY BYPASS GRAFTING (CABG);  Surgeon: Grace Isaac, MD;  Location: [facility];  Service: Open Heart Surgery;  Laterality: N/A;  Times 3 using left internal mammary artery and endoscopically harvested bilateral saphenous vein  . ENDARTERECTOMY Left 05/21/2016   Procedure: LEFT CAROTID ENDARTERECTOMY;  Surgeon: Elam Dutch, MD;  Location: [facility];  Service: Vascular;  Laterality: Left;  . ENDARTERECTOMY FEMORAL Left 02/23/2018   Procedure: LEFT COMMON FEMORAL ENDARTERECTOMY;  Surgeon: Elam Dutch, MD;  Location: [facility];  Service: Vascular;  Laterality: Left;  . EYE SURGERY Bilateral    ioc for cataracts  . INSERTION OF ILIAC STENT Bilateral 02/23/2018   Procedure: INSERTION OF BILATERAL COMMON ILIAC STENTS WITH VIABAHN STENTS;  Surgeon: Elam Dutch, MD;  Location: [facility];  Service: Vascular;  Laterality: Bilateral;  . INTRAOPERATIVE TRANSESOPHAGEAL ECHOCARDIOGRAM N/A 07/01/2013   Procedure: INTRAOPERATIVE TRANSESOPHAGEAL ECHOCARDIOGRAM;  Surgeon: Grace Isaac, MD;  Location: [facility];  Service: Open Heart Surgery;  Laterality: N/A;  . LEFT AND RIGHT HEART CATHETERIZATION WITH CORONARY/GRAFT ANGIOGRAM N/A 10/03/2014   Procedure: LEFT AND RIGHT HEART CATHETERIZATION WITH Beatrix Fetters;  Surgeon: Leonie Man, MD;  Location: [facility] CATH LAB;  Service: Cardiovascular; severe prox RCA 80% (dRCA w/ competetive flow & 60% hazy ISR in Native RCA), patent LIMA-LAD  & SVG-OM1-RI - retrograde fills OM2, known native LM & mod OM2.  Severe Systemic HTN - LVEDP/PCWP 22 & 28 mmHg, Normal CO/CI  . LEFT HEART CATHETERIZATION WITH CORONARY ANGIOGRAM N/A 06/11/2011   Procedure: LEFT HEART CATHETERIZATION WITH CORONARY ANGIOGRAM;  Surgeon: Fulton Reek, MD;  Location: [facility] CATH LAB;;  Dense LM & prox LAD, prox RCA calcification. Large bifurcating OM1 & small OM2 normal. Minor LAD & D1 dz, tortuous prox-mid RCA w/ 60% &  subtotal occluded dRCA w/ normal PDA & PLV   . LEFT HEART CATHETERIZATION WITH CORONARY ANGIOGRAM N/A 06/28/2013   Procedure: LEFT HEART CATHETERIZATION WITH CORONARY ANGIOGRAM;  Surgeon: Leonie Man, MD;  Location: [facility] CATH LAB;  Service: Cardiovascular;  ostial LM disese, RCA ~40-50%ISR  . NM MYOVIEW LTD  04/29/2018   LEXISCAN: Non-gated study with small fixed moderate intensity defect in the apical inferior wall suspicious for either small area of infarction versus attenuation (no change from previous study).  LOW RISK.   Marland Kitchen PATCH ANGIOPLASTY Left 05/21/2016   Procedure: PATCH ANGIOPLASTY USING HEMASHIELD PLATINUM FINESSE 0.3in x 3 in;  Surgeon: Elam Dutch, MD;  Location: [facility];  Service: Vascular;  Laterality: Left;  . PATCH ANGIOPLASTY Left 02/23/2018   Procedure: PATCH ANGIOPLASTY LEFT COMMON FEMORAL ARTERY;  Surgeon: Elam Dutch, MD;  Location: [facility];  Service: Vascular;  Laterality: Left;  . PERCUTANEOUS CORONARY STENT INTERVENTION (PCI-S)  06/11/2011   Procedure: PERCUTANEOUS CORONARY STENT INTERVENTION (PCI-S);  Surgeon: Fulton Reek, MD;  Location: [facility] CATH LAB;  Service: Cardiovascular;;2 overlapping Promus DES 2.63mm at 12 mm x2; postdilated to 3 mm  . PERCUTANEOUS CORONARY STENT INTERVENTION (PCI-S) N/A 10/12/2014   Procedure: PERCUTANEOUS CORONARY STENT INTERVENTION (PCI-S);  Surgeon: Leonie Man, MD;  Location: [facility] CATH LAB;  Ostial-Prox SVG-RCA Xience Alpine DES 3.0 mm x 38 mm; (3.4  - 3.3 mm)  . RETINAL LASER PROCEDURE Bilateral    "more than once"   . TONSILLECTOMY  as child  . TRANSTHORACIC ECHOCARDIOGRAM  03/2017    EF 55-60%.  Normal thickness.  No R WMA..  GR 2 DD.  Mild-mod calcified aortic valve.  Mild RV systolic dysfunction.  Moderate RV dilation.  Moderately increased PA pressures.;;;  07/2019: EF 65%.  No WMA.  Unable to assess diastolic marginal RV PA pressures.  Mild to moderate LA dilation with moderate mild aortic valve sclerosis but no stenosis.    MEDICATIONS/ALLERGIES   Current Meds  Medication Sig  . acetaminophen (TYLENOL) 325 MG tablet Take 650 mg by mouth daily as needed for mild pain or headache.   Marland Kitchen amLODipine (NORVASC) 10 MG tablet Take 1 tablet by mouth daily.  . carvedilol (COREG) 12.5 MG tablet TAKE 1 TABLET BY MOUTH TWICE DAILY.  Marland Kitchen clopidogrel (PLAVIX) 75 MG tablet TAKE 1 TABLET DAILY WITH BREAKFAST.  Marland Kitchen Continuous Blood Gluc Sensor (FREESTYLE LIBRE 14 DAY SENSOR) MISC MONITOR BS CONTINUOUSLY  CHANGE Q 14 DAYS.  Marland Kitchen docusate sodium (COLACE) 100 MG capsule Take 100 mg by mouth daily as needed for mild constipation.   . fenofibrate 160 MG tablet TAKE 1 TABLET ONCE DAILY.  . furosemide (LASIX) 80 MG tablet Take 160 mg by mouth 2 (two) times daily. MAY TAKE AN EXTRA 80 MG IF NEEDED FOR INCREASE SWELLING OR SHORTNESS OF BREATH LYING DOWN.  Marland Kitchen HUMALOG KWIKPEN 200 UNIT/ML SOPN Inject 20-28 Units into the skin See admin instructions. Use 20 units (base) per meal increase units given by 1 unit for every 15 increment over 150 (I.E. Blood glucose reading 165=21 units, 180=22 units, etc. )  . hydrALAZINE (APRESOLINE) 100 MG tablet TAKE 1 TABLET BY MOUTH 3 TIMES A DAY.  . isosorbide mononitrate (IMDUR) 30 MG 24 hr tablet TAKE 1 TABLET ONCE DAILY IN THE EVENING.  Marland Kitchen ketoconazole (NIZORAL) 2 % cream As directed  . pantoprazole (PROTONIX) 40 MG tablet TAKE 1 TABLET ONCE DAILY.  Marland Kitchen REPATHA SURECLICK 992 MG/ML SOAJ INJECT CONTENTS OF 1 PEN SUBCUTANEOUSLY EVERY 14 DAYS.  Marland Kitchen rosuvastatin (CRESTOR) 10 MG tablet TAKE 1 TABLET ON TUESDAY, THURSDAY, SATURDAY AND SUNDAY.  Marland Kitchen TOUJEO MAX SOLOSTAR 300 UNIT/ML SOPN Inject 66 Units into the skin 2 (  two) times daily.   Marland Kitchen VASCEPA 1 G CAPS Take 2 g by mouth 2 (two) times daily.   . [DISCONTINUED] aspirin EC 81 MG tablet Take 81 mg by mouth 4  (four) times a week. Swallow whole.  . [DISCONTINUED] isosorbide mononitrate (IMDUR) 60 MG 24 hr tablet TAKE 1 TABLET ONCE DAILY.    Allergies  Allergen Reactions  . Atorvastatin Other (See Comments)    Leg pain  . Ibuprofen Hives  . Pravastatin Other (See Comments)    Leg pain  . Simvastatin Other (See Comments)    Leg pain    SOCIAL HISTORY/FAMILY HISTORY   Reviewed in Epic:  Pertinent findings: He has now retired.  Very much limited as far as activity goes. [place] to go to [employer].   OBJCTIVE -PE, EKG, labs   Wt Readings from Last 3 Encounters:  07/25/20 197 lb (89.4 kg)  04/17/20 208 lb 12.8 oz (94.7 kg)  11/10/19 216 lb 6.4 oz (98.2 kg)    Physical Exam: BP (!) 126/58 (BP Location: Left Arm, Patient Position: Sitting, Cuff Size: Normal)   Pulse 60   Ht 5\' 8"  (1.727 m)   Wt 197 lb (89.4 kg)   BMI 29.95 kg/m  Physical Exam Vitals reviewed.  Constitutional:      General: He is not in acute distress.    Appearance: He is well-developed. He is obese. He is ill-appearing (Somewhat chronically ill-appearing, but nontoxic.).     Comments: Obese gentleman.  Well-groomed.   HENT:     Head: Normocephalic and atraumatic.  Neck:     Vascular: Carotid bruit (bilateral (vs. AS murmur radiated)) present. No hepatojugular reflux (Trivial) or JVD.     Comments: His neck is somewhat stiff today, but Cardiovascular:     Rate and Rhythm: Regular rhythm. Bradycardia present. Occasional extrasystoles are present.    Chest Wall: PMI is not displaced.     Pulses: Decreased pulses (Barely palpable pedal pulses).     Heart sounds: S1 normal. Heart sounds are distant. Murmur heard.   Harsh crescendo-decrescendo early systolic murmur is present with a grade of 1/6 at the upper right sternal border radiating to the neck. High-pitched blowing holosystolic murmur of grade 1/6 is also present at the apex. No friction rub. Gallop present.      Comments: Split  S2 Pulmonary:     Effort: Pulmonary effort is normal. No respiratory distress.     Breath sounds: Normal breath sounds. No wheezing.  Chest:     Chest wall: No tenderness.  Abdominal:     General: Bowel sounds are normal. There is no distension.     Palpations: Abdomen is soft.     Tenderness: There is no abdominal tenderness.     Comments: Protuberant abdomen with rectal obesity.  Unable to assess HSM  Musculoskeletal:        General: Swelling (1+ bilateral ankle edema) present. Normal range of motion.     Cervical back: Full passive range of motion without pain, normal range of motion and neck supple.  Neurological:     General: No focal deficit present.     Mental Status: He is alert and oriented to person, place, and time.  Psychiatric:        Behavior: Behavior normal.        Thought Content: Thought content normal.        Judgment: Judgment normal.     Comments: His affect is still  flat and mood is somewhat subdued.     Adult ECG Report n/a  Recent Labs: --> he was started on Repatha by Dr. Forde Dandy.   Lab Results  Component Value Date   CHOL 150 12/02/2018   HDL 23 (L) 12/02/2018   LDLCALC 59 12/02/2018   TRIG 341 (H) 12/02/2018   CHOLHDL 6.5 (H) 12/02/2018   Lab Results  Component Value Date   HGBA1C 7.3 (H) 02/16/2018   Lab Results  Component Value Date   CREATININE 3.23 (H) 09/21/2019   BUN 56 (H) 09/21/2019   NA 139 09/21/2019   K 4.6 09/21/2019   CL 98 09/21/2019   CO2 24 09/21/2019   Lab Results  Component Value Date   WBC 6.3 08/10/2019   HGB 9.9 (L) 08/10/2019   HCT 30.2 (L) 08/10/2019   MCV 84 08/10/2019   PLT 265 08/10/2019    ======================================  COVID-19 Education: The signs and symptoms of COVID-19 were discussed with the patient and how to seek care for testing (follow up with PCP or arrange E-visit).   The importance of social distancing and COVID-19 vaccination was discussed today. The patient is practicing social  distancing & Masking.   He has had both of his COVID-19 vaccines, and posterior.   I spent a total of 28 minutes with the patient. >  50% of the time was spent in direct patient consultation.  Additional time spent with chart review  / charting (studies, outside notes, etc): 20min Total Time: 31min  Current medicines are reviewed at length with the patient today.  (+/- concerns) N/A  Notice: This dictation was prepared with Dragon dictation along with smaller phrase technology. Any transcriptional errors that result from this process are unintentional and may not be corrected upon review.  Patient Instructions / Medication Changes & Studies & Tests Ordered   Patient Instructions  Medication Instructions:  Not needed  *If you need a refill on your cardiac medications before your next appointment, please call your pharmacy*   Lab Work:  Not needed  pLease have Dr Forde Dandy send a copy of today's and recent labs   Testing/Procedures: Not needed   Follow-Up: At [facility], you and your health needs are our priority.  As part of our continuing mission to provide you with exceptional heart care, we have created designated Provider Care Teams.  These Care Teams include your primary Cardiologist (physician) and Advanced Practice Providers (APPs -  Physician Assistants and Nurse Practitioners) who all work together to provide you with the care you need, when you need it.     Your next appointment:   6 month(s)  The format for your next appointment:   In Person  Provider:   Glenetta Hew, MD      Studies Ordered:   No orders of the defined types were placed in this encounter.    Glenetta Hew, M.D., M.S. Interventional Cardiologist   Pager # [phone] Phone # [phone] [address]. [place], [place] [address]   Thank you for choosing Heartcare at [facility]!!

## 2020-07-25 NOTE — Patient Instructions (Signed)
Medication Instructions:  Not needed  *If you need a refill on your cardiac medications before your next appointment, please call your pharmacy*   Lab Work:  Not needed  pLease have Dr Forde Dandy send a copy of today's and recent labs   Testing/Procedures: Not needed   Follow-Up: At [facility], you and your health needs are our priority.  As part of our continuing mission to provide you with exceptional heart care, we have created designated Provider Care Teams.  These Care Teams include your primary Cardiologist (physician) and Advanced Practice Providers (APPs -  Physician Assistants and Nurse Practitioners) who all work together to provide you with the care you need, when you need it.     Your next appointment:   6 month(s)  The format for your next appointment:   In Person  Provider:   Glenetta Hew, MD

## 2020-07-26 ENCOUNTER — Other Ambulatory Visit: Payer: Self-pay | Admitting: Cardiology

## 2020-07-31 DIAGNOSIS — N184 Chronic kidney disease, stage 4 (severe): Secondary | ICD-10-CM | POA: Diagnosis not present

## 2020-07-31 DIAGNOSIS — Z794 Long term (current) use of insulin: Secondary | ICD-10-CM | POA: Diagnosis not present

## 2020-07-31 DIAGNOSIS — I10 Essential (primary) hypertension: Secondary | ICD-10-CM | POA: Diagnosis not present

## 2020-07-31 DIAGNOSIS — E1151 Type 2 diabetes mellitus with diabetic peripheral angiopathy without gangrene: Secondary | ICD-10-CM | POA: Diagnosis not present

## 2020-08-02 ENCOUNTER — Encounter: Payer: Self-pay | Admitting: Cardiology

## 2020-08-02 NOTE — Assessment & Plan Note (Signed)
Stable blood pressure on current meds.  No change.

## 2020-08-02 NOTE — Assessment & Plan Note (Signed)
PCI to SVG-RCA in 2016.  With his significant CAD, PAD and carotid disease, he remain on lifelong clopidogrel.  Plan:  Okay to hold clopidogrel/Plavix for 5 to 7 days preop for surgeries or procedures.    Aggressive lipid management with combination of Repatha, intermittent Crestor, fibrate and Vascepa  On combination of calcium channel blocker, beta-blocker and combination hydralazine/Imdur

## 2020-08-02 NOTE — Assessment & Plan Note (Signed)
Followed by nephrology.  Creatinine is remained stable at about 3.2.  This precludes interventional options for either peripheral or coronary vasculature.  Per nephrology, would prefer not using metolazone.  Simply increasing the dose of Lasix. Not on ACE inhibitor or ARB-using hydralazine/Imdur for afterload reduction.  He is indicated with no uncertainty that he will not go on dialysis.

## 2020-08-02 NOTE — Assessment & Plan Note (Signed)
Stable NYHA Class II symptoms, but is activities limited more by claudication.  He has exertional dyspnea and intermittent swelling which is as much related to venous stasis than anything else-no PND orthopnea.  EF is well-preserved, but likely still has elevated filling pressures.  He is on hydralazine-Imdur at high doses for afterload reduction (hydralazine 100 mg 3 times daily along with Imdur 90 mg-in lieu of ACE inhibitor/ARB because of renal failure); also on amlodipine.  On max tolerated dose of carvedilol (heart rate 60 bpm)  Remains on high doses of diuretic-furosemide 160 mg twice daily with additional 80 mg as needed.  Nephrology is indicated that they would not want [place] to use Zaroxolyn (we did use it in an urgent situation last year).  He has not had any further exacerbations since April of last year.

## 2020-08-02 NOTE — Assessment & Plan Note (Signed)
He clearly has made a conscious effort of adjusting his diet and is exercising more.  He has lost at least another 10 pounds.  This is really helped his blood pressure as well as his dyspnea.  Edema is essentially resolved.  He feels better overall.  He is now officially below the "obesity range ".  I congratulated his efforts and encouraged him to continue to lose.

## 2020-08-02 NOTE — Assessment & Plan Note (Signed)
I think the clearly has some venous stasis as much as CHF leading to his edema.  With the weight loss, the edema is notably improved.  We talked about support stockings and foot elevation.

## 2020-08-02 NOTE — Assessment & Plan Note (Signed)
7 years out from CABG.  6 years out from PCI.  No anginal symptoms.  Was able to shovel snow without angina.  He did have dyspnea, but no angina.. Echo with normal EF.  Myoview in 2019 was nonischemic.  Low risk.  Will be due for follow-up in late 2013  Plan:  On combination of carvedilol and amlodipine along with Imdur for antianginal benefit.  Also on hydralazine for afterload reduction along with Imdur.  Not on ACE inhibitor or ARB because of CKD IV  On fenofibrate and rosuvastatin.  Only able to tolerate rosuvastatin 4 days a week.  Also on Vascepa.  -> Has been started on Repatha.

## 2020-08-02 NOTE — Assessment & Plan Note (Signed)
Perhaps the most limiting feature is his PAD.  Continue to recommend walking as much as possible.  Following up with Dr. [place] Alar.  Is on aggressive cardiovascular risk modification.  No interventional options left.  Last ABIs showed improvement in flow.

## 2020-08-02 NOTE — Assessment & Plan Note (Signed)
LDL is wonderful at 59 on combination of 4 medications: Repatha, intermittent Crestor, Vascepa and fenofibrate  Despite being on Vascepa and fenofibrate, his triglycerides are still elevated at 341 => likely related diabetes and dietary indiscretion.  Discussed importance of monitoring his diet.  Unfortunately HDL is still down to 23.  This is probably partly related to overall total cholesterol reduction.  He is not able to exercise, and therefore will not build to increase.

## 2020-08-08 ENCOUNTER — Other Ambulatory Visit: Payer: Self-pay | Admitting: Cardiology

## 2020-08-19 ENCOUNTER — Other Ambulatory Visit: Payer: Self-pay | Admitting: Cardiology

## 2020-09-18 ENCOUNTER — Ambulatory Visit (INDEPENDENT_AMBULATORY_CARE_PROVIDER_SITE_OTHER): Payer: Medicare Other | Admitting: Ophthalmology

## 2020-09-18 ENCOUNTER — Other Ambulatory Visit: Payer: Self-pay

## 2020-09-18 ENCOUNTER — Encounter (INDEPENDENT_AMBULATORY_CARE_PROVIDER_SITE_OTHER): Payer: Self-pay | Admitting: Ophthalmology

## 2020-09-18 DIAGNOSIS — E113512 Type 2 diabetes mellitus with proliferative diabetic retinopathy with macular edema, left eye: Secondary | ICD-10-CM

## 2020-09-18 DIAGNOSIS — I70219 Atherosclerosis of native arteries of extremities with intermittent claudication, unspecified extremity: Secondary | ICD-10-CM

## 2020-09-18 DIAGNOSIS — E113553 Type 2 diabetes mellitus with stable proliferative diabetic retinopathy, bilateral: Secondary | ICD-10-CM

## 2020-09-18 DIAGNOSIS — Z794 Long term (current) use of insulin: Secondary | ICD-10-CM | POA: Diagnosis not present

## 2020-09-18 NOTE — Assessment & Plan Note (Signed)

## 2020-09-18 NOTE — Progress Notes (Signed)
09/18/2020     CHIEF COMPLAINT Patient presents for Retina Follow Up (6 Mo F/U OU//Pt denies noticeable changes to [place] OU since last visit. Pt denies ocular pain, flashes of light, or floaters OU. //A1c: "around 7" does not recall when/LBS: 123 this AM)   HISTORY OF PRESENT ILLNESS: Lance Collier is a 75 y.o. male who presents to the clinic today for:   HPI    Retina Follow Up    Patient presents with  Diabetic Retinopathy.  In both eyes.  This started 6 months ago.  Severity is mild.  Duration of 6 months.  Since onset it is stable. Additional comments: 6 Mo F/U OU  Pt denies noticeable changes to [place] OU since last visit. Pt denies ocular pain, flashes of light, or floaters OU.   A1c: "around 7" does not recall when LBS: 123 this AM       Last edited by Rockie Neighbours, [place] on 09/18/2020  8:20 AM. (History)      Referring physician: Reynold Bowen, MD [place],  [place] [id number]  HISTORICAL INFORMATION:   Selected notes from the MEDICAL RECORD NUMBER    Lab Results  Component Value Date   HGBA1C 7.3 (H) 02/16/2018     CURRENT MEDICATIONS: No current outpatient medications on file. (Ophthalmic Drugs)   No current facility-administered medications for this visit. (Ophthalmic Drugs)   Current Outpatient Medications (Other)  Medication Sig  . acetaminophen (TYLENOL) 325 MG tablet Take 650 mg by mouth daily as needed for mild pain or headache.   Marland Kitchen amLODipine (NORVASC) 10 MG tablet Take 1 tablet by mouth daily.  . carvedilol (COREG) 12.5 MG tablet TAKE 1 TABLET BY MOUTH TWICE DAILY.  Marland Kitchen clopidogrel (PLAVIX) 75 MG tablet TAKE 1 TABLET DAILY WITH BREAKFAST.  Marland Kitchen Continuous Blood Gluc Sensor (FREESTYLE LIBRE 14 DAY SENSOR) MISC MONITOR BS CONTINUOUSLY  CHANGE Q 14 DAYS.  Marland Kitchen docusate sodium (COLACE) 100 MG capsule Take 100 mg by mouth daily as needed for mild constipation.   . fenofibrate 160 MG tablet TAKE 1 TABLET ONCE DAILY.  . furosemide (LASIX) 80 MG tablet  Take 160 mg by mouth 2 (two) times daily. MAY TAKE AN EXTRA 80 MG IF NEEDED FOR INCREASE SWELLING OR SHORTNESS OF BREATH LYING DOWN.  Marland Kitchen HUMALOG KWIKPEN 200 UNIT/ML SOPN Inject 20-28 Units into the skin See admin instructions. Use 20 units (base) per meal increase units given by 1 unit for every 15 increment over 150 (I.E. Blood glucose reading 165=21 units, 180=22 units, etc. )  . hydrALAZINE (APRESOLINE) 100 MG tablet TAKE 1 TABLET BY MOUTH 3 TIMES A DAY.  . isosorbide mononitrate (IMDUR) 30 MG 24 hr tablet TAKE 1 TABLET ONCE DAILY IN THE EVENING.  . isosorbide mononitrate (IMDUR) 60 MG 24 hr tablet TAKE 1 TABLET ONCE DAILY.  Marland Kitchen ketoconazole (NIZORAL) 2 % cream As directed  . pantoprazole (PROTONIX) 40 MG tablet TAKE 1 TABLET ONCE DAILY.  Marland Kitchen REPATHA SURECLICK 762 MG/ML SOAJ INJECT CONTENTS OF 1 PEN SUBCUTANEOUSLY EVERY 14 DAYS.  Marland Kitchen rosuvastatin (CRESTOR) 10 MG tablet TAKE 1 TABLET ON TUESDAY, THURSDAY, SATURDAY AND SUNDAY.  Marland Kitchen TOUJEO MAX SOLOSTAR 300 UNIT/ML SOPN Inject 66 Units into the skin 2 (two) times daily.   Marland Kitchen VASCEPA 1 G CAPS Take 2 g by mouth 2 (two) times daily.    No current facility-administered medications for this visit. (Other)      REVIEW OF SYSTEMS:    ALLERGIES Allergies  Allergen  Reactions  . Atorvastatin Other (See Comments)    Leg pain  . Ibuprofen Hives  . Pravastatin Other (See Comments)    Leg pain  . Simvastatin Other (See Comments)    Leg pain    PAST MEDICAL HISTORY Past Medical History:  Diagnosis Date  . Anemia   . Aortic sclerosis  - without Stenosis  March 2014   Echo: Aortic sclerosis without stenosis. EF 55-60% with normal WM; mild concentric hypertrophy with normal relaxation.  . Arthritis   . CAD (coronary artery disease), autologous vein bypass graft 09/2014   10/03/14: Cath for Class III-IV Angina -- 80% ostial-proximal SVG-RPDA (minimal flow down native PDA from native RCA with 50% proximal and distal, patent LIMA-LAD, SVG-OM with retrograde  filling of OM 2. --> 10/12/14: Staged PCI of SVG-RPDA - Xience DES  3.0 mm x 38 mm; (3.4  - 3.3 mm)  . CAD S/P percutaneous coronary angioplasty 06/12/2011; 09/2104   a) 2012: Complex PCI mRCA ([place]) --  2 overlapping Promus element DES stents.; 09/2014:  ost-prox SVG-rPDA - Xience DES 3.0 mm x 38 mm; (3.4  - 3.3 mm)  . CHF (congestive heart failure) ([place])   . Childhood asthma   . CKD (chronic kidney disease), stage III ([place])    "mild; related to diabetes" (10/02/2014)  . Complication of anesthesia    "I've had name recall recognition problems since my last anesthesia"  . DM (diabetes mellitus) type II uncontrolled with eye manifestation ([place]) 2013   Diabetic retinopathy with retinal hemorrhages since;, recurrent in August 2014 treated with Avastin shots  . Dyspnea    mainly with exertion  . GERD (gastroesophageal reflux disease)   . Heart murmur    questionable  . High cholesterol   . Hypertension   . Migraine ~ 2004   "once"  . PAD (peripheral artery disease) (HCC)    with claudication  . PONV (postoperative nausea and vomiting)    s/p tonsillectomy  . 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 07/01/2013 (Cath for [place] Unstable Angina) --> [name]: For LM disease; LIMA-LAD, SVG-RCA, SCG-Cx; b. 09/2014 Cath/Staged PCI: patent LIMA->LAD, VG->OM1/RI, VG->RCA 80% (3.0x38 Xience Alpine DES on 10/12/2014).  . 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  Likely thromboembolic event with PDA occlusion. 06/12/2011   Post Complex PCI, pt states no mi  . Wears glasses    reading   Past Surgical History:  Procedure Laterality Date  . ABDOMINAL AORTOGRAM W/LOWER EXTREMITY N/A 02/05/2018   Procedure: ABDOMINAL AORTOGRAM W/LOWER EXTREMITY;  Surgeon: Elam Dutch, MD;  Location: [facility];  Service: Cardiovascular;  Laterality: N/A;  . COLONOSCOPY    . CORONARY ARTERY BYPASS GRAFT N/A 07/01/2013   Procedure: CORONARY ARTERY BYPASS GRAFTING  (CABG);  Surgeon: Grace Isaac, MD;  Location: [facility];  Service: Open Heart Surgery;  Laterality: N/A;  Times 3 using left internal mammary artery and endoscopically harvested bilateral saphenous vein  . ENDARTERECTOMY Left 05/21/2016   Procedure: LEFT CAROTID ENDARTERECTOMY;  Surgeon: Elam Dutch, MD;  Location: [facility];  Service: Vascular;  Laterality: Left;  . ENDARTERECTOMY FEMORAL Left 02/23/2018   Procedure: LEFT COMMON FEMORAL ENDARTERECTOMY;  Surgeon: Elam Dutch, MD;  Location: [facility];  Service: Vascular;  Laterality: Left;  . EYE SURGERY Bilateral    ioc for cataracts  . INSERTION OF ILIAC STENT Bilateral 02/23/2018   Procedure: INSERTION OF BILATERAL COMMON ILIAC STENTS WITH VIABAHN  STENTS;  Surgeon: Elam Dutch, MD;  Location: [facility];  Service: Vascular;  Laterality: Bilateral;  . INTRAOPERATIVE TRANSESOPHAGEAL ECHOCARDIOGRAM N/A 07/01/2013   Procedure: INTRAOPERATIVE TRANSESOPHAGEAL ECHOCARDIOGRAM;  Surgeon: Grace Isaac, MD;  Location: [facility];  Service: Open Heart Surgery;  Laterality: N/A;  . LEFT AND RIGHT HEART CATHETERIZATION WITH CORONARY/GRAFT ANGIOGRAM N/A 10/03/2014   Procedure: LEFT AND RIGHT HEART CATHETERIZATION WITH Beatrix Fetters;  Surgeon: Leonie Man, MD;  Location: [facility] CATH LAB;  Service: Cardiovascular; severe prox RCA 80% (dRCA w/ competetive flow & 60% hazy ISR in Native RCA), patent LIMA-LAD  & SVG-OM1-RI - retrograde fills OM2, known native LM & mod OM2.  Severe Systemic HTN - LVEDP/PCWP 22 & 28 mmHg, Normal CO/CI  . LEFT HEART CATHETERIZATION WITH CORONARY ANGIOGRAM N/A 06/11/2011   Procedure: LEFT HEART CATHETERIZATION WITH CORONARY ANGIOGRAM;  Surgeon: Fulton Reek, MD;  Location: [facility] CATH LAB;;  Dense LM & prox LAD, prox RCA calcification. Large bifurcating OM1 & small OM2 normal. Minor LAD & D1 dz, tortuous prox-mid RCA w/ 60% &  subtotal occluded dRCA w/ normal PDA & PLV   . LEFT HEART CATHETERIZATION WITH CORONARY ANGIOGRAM N/A  06/28/2013   Procedure: LEFT HEART CATHETERIZATION WITH CORONARY ANGIOGRAM;  Surgeon: Leonie Man, MD;  Location: [facility] CATH LAB;  Service: Cardiovascular;  ostial LM disese, RCA ~40-50%ISR  . NM MYOVIEW LTD  04/29/2018   LEXISCAN: Non-gated study with small fixed moderate intensity defect in the apical inferior wall suspicious for either small area of infarction versus attenuation (no change from previous study).  LOW RISK.   Marland Kitchen PATCH ANGIOPLASTY Left 05/21/2016   Procedure: PATCH ANGIOPLASTY USING HEMASHIELD PLATINUM FINESSE 0.3in x 3 in;  Surgeon: Elam Dutch, MD;  Location: [facility];  Service: Vascular;  Laterality: Left;  . PATCH ANGIOPLASTY Left 02/23/2018   Procedure: PATCH ANGIOPLASTY LEFT COMMON FEMORAL ARTERY;  Surgeon: Elam Dutch, MD;  Location: [facility];  Service: Vascular;  Laterality: Left;  . PERCUTANEOUS CORONARY STENT INTERVENTION (PCI-S)  06/11/2011   Procedure: PERCUTANEOUS CORONARY STENT INTERVENTION (PCI-S);  Surgeon: Fulton Reek, MD;  Location: [facility] CATH LAB;  Service: Cardiovascular;;2 overlapping Promus DES 2.43mm at 12 mm x2; postdilated to 3 mm  . PERCUTANEOUS CORONARY STENT INTERVENTION (PCI-S) N/A 10/12/2014   Procedure: PERCUTANEOUS CORONARY STENT INTERVENTION (PCI-S);  Surgeon: Leonie Man, MD;  Location: [facility] CATH [facility];  Ostial-Prox SVG-RCA Xience Alpine DES 3.0 mm x 38 mm; (3.4  - 3.3 mm)  . RETINAL LASER PROCEDURE Bilateral    "more than once"   . TONSILLECTOMY  as child  . TRANSTHORACIC ECHOCARDIOGRAM  03/2017    EF 55-60%.  Normal thickness.  No R WMA..  GR 2 DD.  Mild-mod calcified aortic valve.  Mild RV systolic dysfunction.  Moderate RV dilation.  Moderately increased PA pressures.;;; 07/2019: EF 65%.  No WMA.  Unable to assess diastolic marginal RV PA pressures.  Mild to moderate LA dilation with moderate mild aortic valve sclerosis but no stenosis.    FAMILY HISTORY Family History  Problem Relation Age of Onset  . Lung cancer Sister   . Breast cancer  Sister   . Breast cancer Mother   . Hyperlipidemia Father   . Hypertension Father   . Throat cancer Father   . Colon polyps Father   . Liver cancer Maternal Grandmother   . Stomach cancer Neg Hx   . Rectal cancer Neg Hx   . Colon cancer Neg Hx   .  Esophageal cancer Neg Hx     SOCIAL HISTORY Social History   Tobacco Use  . Smoking status: Former Smoker    Packs/day: 2.00    Years: 27.00    Pack years: 54.00    Types: Cigarettes    Quit date: 11/23/1996    Years since quitting: 23.8  . Smokeless tobacco: Never Used  Vaping Use  . Vaping Use: Never used  Substance Use Topics  . Alcohol use: Yes    Comment: rarely  . Drug use: No         OPHTHALMIC EXAM:  Base Eye Exam    Visual Acuity (ETDRS)      Right Left   Dist [place] 20/40 -2 20/40   Dist ph [place] 20/40 +2 20/25 -2       Tonometry (Tonopen, 8:21 AM)      Right Left   Pressure 16 08       Pupils      Dark Light Shape React APD   Right 3 2 Round Slow None   Left 3.5 2.5 Round Slow None       Visual Fields (Counting fingers)      Left Right     Full   Restrictions Partial outer superior temporal, superior nasal deficiencies        Extraocular Movement      Right Left    Full Full       Neuro/Psych    Oriented x3: Yes   Mood/Affect: Normal       Dilation    Both eyes: 1.0% Mydriacyl, 2.5% Phenylephrine @ 8:24 AM        Slit Lamp and Fundus Exam    External Exam      Right Left   External Normal, trace to 1+ pitting edema pretibial Normal, trace to 1+ pitting edema pretibial       Slit Lamp Exam      Right Left   Lids/Lashes Normal Normal   Conjunctiva/Sclera White and quiet White and quiet   Cornea Clear Clear   Anterior Chamber Deep and quiet Deep and quiet   Iris Round and reactive Round and reactive   Lens Posterior chamber intraocular lens Posterior chamber intraocular lens   Anterior Vitreous Normal Normal       Fundus Exam      Right Left   Posterior Vitreous Normal, clear  avitric Normal, clear, vitrectomized   Disc Normal Normal   C/D Ratio 0.2-.25 0.2   Macula Microaneurysms, no clinically significant macular edema, no macular thickening Microaneurysms, no clinically significant macular edema   Vessels PDR-quiet PDR quiet   Periphery Normal good PRP          IMAGING AND PROCEDURES  Imaging and Procedures for 09/18/20  Color Fundus Photography Optos - OU - Both Eyes       Right Eye Progression has been stable. Disc findings include normal observations. Macula : microaneurysms. Periphery : normal observations.   Left Eye Progression has been stable. Disc findings include normal observations. Macula : microaneurysms. Periphery : normal observations.   Notes PDR QUIET                ASSESSMENT/PLAN:  Controlled type 2 diabetes mellitus with stable proliferative retinopathy of both eyes, with long-term current use of insulin (HCC) The nature of regressed proliferative diabetic retinopathy was discussed with the patient. The patient was advised to maintain good glucose, blood pressure, monitor kidney function and serum lipid control as advised  by personal physician. Rare risk for reactivation of progression exist with untreated severe anemia, untreated renal failure, untreated heart failure, and smoking. Complete avoidance of smoking was recommended. The chance of recurrent proliferative diabetic retinopathy was discussed as well as the chance of vitreous hemorrhage for which further treatments may be necessary.   Explained to the patient that the quiescent  proliferative diabetic retinopathy disease is unlikely to ever worsen.  Worsening factors would include however severe anemia, hypertension out-of-control or impending renal failure.  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 The  patient was advised to maintain good glucose, blood pressure, monitor kidney function and serum lipid control as advised by personal physician. Rare risk for reactivation of progression exist with untreated severe anemia, untreated renal failure, untreated heart failure, and smoking. Complete avoidance of smoking was recommended. The chance of recurrent proliferative diabetic retinopathy was discussed as well as the chance of vitreous hemorrhage for which further treatments may be necessary.   Explained to the patient that the quiescent  proliferative diabetic retinopathy disease is unlikely to ever worsen.  Worsening factors would include however severe anemia, hypertension out-of-control or impending renal failure.      ICD-10-CM   1. 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 Controlled type 2 diabetes mellitus with stable proliferative retinopathy of both eyes, with long-term current use of insulin ([place])  D14.9702    Z79.4     1.  Patient instructed to contact the office promptly for new onset visual acuity declines distortions or dimming of vision as has occurred in the past  2.  History of recurrent mild vitreous hemorrhages in the midst of quiescent PDR likely has been result of previous CHF exacerbation with volume overload leading to vascular distention and microbleeds within the vitreous cavity.  These of been controlled in the past with periodic intravitreal injection Avastin, most recent was 09/12/2019.  During this intervening year, no recurrences have developed  3.  Ophthalmic Meds Ordered this visit:  No orders of the defined types were placed in this encounter.      Return in about 9 months (around 06/20/2021) for DILATE OU, COLOR FP, OCT.  There are no Patient Instructions on file for this visit.   Explained the diagnoses, plan, and follow up  with the patient and they expressed understanding.  Patient expressed understanding of the importance of proper follow up care.   Clent Demark Cheree Fowles M.D. Diseases & Surgery of the Retina and Vitreous Retina & Diabetic [place] 09/18/20     Abbreviations: M myopia (nearsighted); A astigmatism; H hyperopia (farsighted); P presbyopia; Mrx spectacle prescription;  CTL contact lenses; OD right eye; OS left eye; OU both eyes  XT exotropia; ET esotropia; PEK punctate epithelial keratitis; PEE punctate epithelial erosions; DES dry eye syndrome; MGD meibomian gland dysfunction; ATs artificial tears; PFAT's preservative free artificial tears; [place] nuclear sclerotic cataract; PSC posterior subcapsular cataract; ERM epi-retinal membrane; PVD posterior vitreous detachment; RD retinal detachment; DM diabetes mellitus; DR diabetic retinopathy; NPDR non-proliferative diabetic retinopathy; PDR proliferative diabetic retinopathy; CSME clinically significant macular edema; DME diabetic macular edema; dbh dot blot hemorrhages; CWS cotton wool spot; POAG primary open angle glaucoma; C/D cup-to-disc ratio; HVF humphrey visual field; GVF goldmann visual field; OCT optical coherence  tomography; IOP intraocular pressure; BRVO Branch retinal vein occlusion; CRVO central retinal vein occlusion; CRAO central retinal artery occlusion; BRAO branch retinal artery occlusion; RT retinal tear; SB scleral buckle; PPV pars plana vitrectomy; VH Vitreous hemorrhage; PRP panretinal laser photocoagulation; IVK intravitreal kenalog; VMT vitreomacular traction; MH Macular hole;  NVD neovascularization of the disc; NVE neovascularization elsewhere; AREDS age related eye disease study; ARMD age related macular degeneration; POAG primary open angle glaucoma; EBMD epithelial/anterior basement membrane dystrophy; ACIOL anterior chamber intraocular lens; IOL intraocular lens; PCIOL posterior chamber intraocular lens; Phaco/IOL phacoemulsification with  intraocular lens placement; [place] photorefractive keratectomy; LASIK laser assisted in situ keratomileusis; HTN hypertension; DM diabetes mellitus; COPD chronic obstructive pulmonary disease

## 2020-09-19 ENCOUNTER — Other Ambulatory Visit: Payer: Self-pay | Admitting: Cardiology

## 2020-09-23 DIAGNOSIS — Z23 Encounter for immunization: Secondary | ICD-10-CM | POA: Diagnosis not present

## 2020-10-03 DIAGNOSIS — E1122 Type 2 diabetes mellitus with diabetic chronic kidney disease: Secondary | ICD-10-CM | POA: Diagnosis not present

## 2020-10-03 DIAGNOSIS — D649 Anemia, unspecified: Secondary | ICD-10-CM | POA: Diagnosis not present

## 2020-10-03 DIAGNOSIS — I2510 Atherosclerotic heart disease of native coronary artery without angina pectoris: Secondary | ICD-10-CM | POA: Diagnosis not present

## 2020-10-03 DIAGNOSIS — I129 Hypertensive chronic kidney disease with stage 1 through stage 4 chronic kidney disease, or unspecified chronic kidney disease: Secondary | ICD-10-CM | POA: Diagnosis not present

## 2020-10-03 DIAGNOSIS — R609 Edema, unspecified: Secondary | ICD-10-CM | POA: Diagnosis not present

## 2020-10-03 DIAGNOSIS — N184 Chronic kidney disease, stage 4 (severe): Secondary | ICD-10-CM | POA: Diagnosis not present

## 2020-10-03 DIAGNOSIS — M79676 Pain in unspecified toe(s): Secondary | ICD-10-CM | POA: Diagnosis not present

## 2020-10-03 DIAGNOSIS — I739 Peripheral vascular disease, unspecified: Secondary | ICD-10-CM | POA: Diagnosis not present

## 2020-10-08 ENCOUNTER — Telehealth: Payer: Self-pay

## 2020-10-08 NOTE — Telephone Encounter (Signed)
Received call from [phone] #[phone] informing us that patient has received Repatha approval 10/08/2020-10/08/2021.  If you have questions, please call above #.

## 2020-10-09 NOTE — Telephone Encounter (Signed)
Message forwarded.

## 2020-10-24 ENCOUNTER — Other Ambulatory Visit: Payer: Self-pay | Admitting: Cardiology

## 2020-11-10 ENCOUNTER — Other Ambulatory Visit: Payer: Self-pay | Admitting: Cardiology

## 2020-11-12 NOTE — Telephone Encounter (Signed)
Rx(s) sent to pharmacy electronically.  

## 2020-11-15 DIAGNOSIS — I739 Peripheral vascular disease, unspecified: Secondary | ICD-10-CM | POA: Diagnosis not present

## 2020-11-15 DIAGNOSIS — I6529 Occlusion and stenosis of unspecified carotid artery: Secondary | ICD-10-CM | POA: Diagnosis not present

## 2020-11-15 DIAGNOSIS — I2510 Atherosclerotic heart disease of native coronary artery without angina pectoris: Secondary | ICD-10-CM | POA: Diagnosis not present

## 2020-11-15 DIAGNOSIS — R946 Abnormal results of thyroid function studies: Secondary | ICD-10-CM | POA: Diagnosis not present

## 2020-11-15 DIAGNOSIS — E1151 Type 2 diabetes mellitus with diabetic peripheral angiopathy without gangrene: Secondary | ICD-10-CM | POA: Diagnosis not present

## 2020-11-15 DIAGNOSIS — I10 Essential (primary) hypertension: Secondary | ICD-10-CM | POA: Diagnosis not present

## 2020-11-15 DIAGNOSIS — E785 Hyperlipidemia, unspecified: Secondary | ICD-10-CM | POA: Diagnosis not present

## 2020-11-15 DIAGNOSIS — M10479 Other secondary gout, unspecified ankle and foot: Secondary | ICD-10-CM | POA: Diagnosis not present

## 2020-11-15 DIAGNOSIS — Z794 Long term (current) use of insulin: Secondary | ICD-10-CM | POA: Diagnosis not present

## 2020-11-15 DIAGNOSIS — E113553 Type 2 diabetes mellitus with stable proliferative diabetic retinopathy, bilateral: Secondary | ICD-10-CM | POA: Diagnosis not present

## 2020-11-15 DIAGNOSIS — N184 Chronic kidney disease, stage 4 (severe): Secondary | ICD-10-CM | POA: Diagnosis not present

## 2020-11-28 DIAGNOSIS — Z961 Presence of intraocular lens: Secondary | ICD-10-CM | POA: Diagnosis not present

## 2020-11-28 DIAGNOSIS — H0102B Squamous blepharitis left eye, upper and lower eyelids: Secondary | ICD-10-CM | POA: Diagnosis not present

## 2020-11-28 DIAGNOSIS — H0102A Squamous blepharitis right eye, upper and lower eyelids: Secondary | ICD-10-CM | POA: Diagnosis not present

## 2020-11-28 DIAGNOSIS — H04123 Dry eye syndrome of bilateral lacrimal glands: Secondary | ICD-10-CM | POA: Diagnosis not present

## 2020-11-28 DIAGNOSIS — H10413 Chronic giant papillary conjunctivitis, bilateral: Secondary | ICD-10-CM | POA: Diagnosis not present

## 2020-11-28 DIAGNOSIS — H18423 Band keratopathy, bilateral: Secondary | ICD-10-CM | POA: Diagnosis not present

## 2020-12-08 ENCOUNTER — Other Ambulatory Visit: Payer: Self-pay | Admitting: Cardiology

## 2021-01-01 DIAGNOSIS — E1151 Type 2 diabetes mellitus with diabetic peripheral angiopathy without gangrene: Secondary | ICD-10-CM | POA: Diagnosis not present

## 2021-01-01 DIAGNOSIS — I10 Essential (primary) hypertension: Secondary | ICD-10-CM | POA: Diagnosis not present

## 2021-01-01 DIAGNOSIS — Z794 Long term (current) use of insulin: Secondary | ICD-10-CM | POA: Diagnosis not present

## 2021-01-01 DIAGNOSIS — N184 Chronic kidney disease, stage 4 (severe): Secondary | ICD-10-CM | POA: Diagnosis not present

## 2021-01-01 DIAGNOSIS — I2510 Atherosclerotic heart disease of native coronary artery without angina pectoris: Secondary | ICD-10-CM | POA: Diagnosis not present

## 2021-01-23 ENCOUNTER — Other Ambulatory Visit: Payer: Self-pay | Admitting: Cardiology

## 2021-01-28 ENCOUNTER — Inpatient Hospital Stay (HOSPITAL_COMMUNITY)
Admission: EM | Admit: 2021-01-28 | Discharge: 2021-01-30 | DRG: 291 | Disposition: A | Discharge status: Home / Self Care | Payer: Medicare Other | Attending: Internal Medicine | Admitting: Internal Medicine

## 2021-01-28 ENCOUNTER — Encounter (HOSPITAL_COMMUNITY): Payer: Self-pay | Admitting: Emergency Medicine

## 2021-01-28 ENCOUNTER — Emergency Department (HOSPITAL_COMMUNITY): Payer: Medicare Other

## 2021-01-28 ENCOUNTER — Other Ambulatory Visit: Payer: Self-pay

## 2021-01-28 ENCOUNTER — Telehealth: Payer: Self-pay | Admitting: Cardiology

## 2021-01-28 DIAGNOSIS — N184 Chronic kidney disease, stage 4 (severe): Secondary | ICD-10-CM | POA: Diagnosis present

## 2021-01-28 DIAGNOSIS — Z79899 Other long term (current) drug therapy: Secondary | ICD-10-CM | POA: Diagnosis not present

## 2021-01-28 DIAGNOSIS — Z8249 Family history of ischemic heart disease and other diseases of the circulatory system: Secondary | ICD-10-CM

## 2021-01-28 DIAGNOSIS — I10 Essential (primary) hypertension: Secondary | ICD-10-CM | POA: Diagnosis present

## 2021-01-28 DIAGNOSIS — D649 Anemia, unspecified: Secondary | ICD-10-CM | POA: Diagnosis present

## 2021-01-28 DIAGNOSIS — I509 Heart failure, unspecified: Secondary | ICD-10-CM | POA: Diagnosis not present

## 2021-01-28 DIAGNOSIS — E119 Type 2 diabetes mellitus without complications: Secondary | ICD-10-CM

## 2021-01-28 DIAGNOSIS — Z951 Presence of aortocoronary bypass graft: Secondary | ICD-10-CM

## 2021-01-28 DIAGNOSIS — I130 Hypertensive heart and chronic kidney disease with heart failure and stage 1 through stage 4 chronic kidney disease, or unspecified chronic kidney disease: Secondary | ICD-10-CM | POA: Diagnosis present

## 2021-01-28 DIAGNOSIS — Z83438 Family history of other disorder of lipoprotein metabolism and other lipidemia: Secondary | ICD-10-CM | POA: Diagnosis not present

## 2021-01-28 DIAGNOSIS — R531 Weakness: Secondary | ICD-10-CM | POA: Diagnosis not present

## 2021-01-28 DIAGNOSIS — I25119 Atherosclerotic heart disease of native coronary artery with unspecified angina pectoris: Secondary | ICD-10-CM | POA: Diagnosis not present

## 2021-01-28 DIAGNOSIS — E7800 Pure hypercholesterolemia, unspecified: Secondary | ICD-10-CM | POA: Diagnosis present

## 2021-01-28 DIAGNOSIS — I5033 Acute on chronic diastolic (congestive) heart failure: Secondary | ICD-10-CM | POA: Diagnosis present

## 2021-01-28 DIAGNOSIS — Z7902 Long term (current) use of antithrombotics/antiplatelets: Secondary | ICD-10-CM

## 2021-01-28 DIAGNOSIS — I110 Hypertensive heart disease with heart failure: Secondary | ICD-10-CM | POA: Diagnosis not present

## 2021-01-28 DIAGNOSIS — E1151 Type 2 diabetes mellitus with diabetic peripheral angiopathy without gangrene: Secondary | ICD-10-CM | POA: Diagnosis present

## 2021-01-28 DIAGNOSIS — I2510 Atherosclerotic heart disease of native coronary artery without angina pectoris: Secondary | ICD-10-CM

## 2021-01-28 DIAGNOSIS — E1122 Type 2 diabetes mellitus with diabetic chronic kidney disease: Secondary | ICD-10-CM | POA: Diagnosis present

## 2021-01-28 DIAGNOSIS — I214 Non-ST elevation (NSTEMI) myocardial infarction: Secondary | ICD-10-CM

## 2021-01-28 DIAGNOSIS — Z8371 Family history of colonic polyps: Secondary | ICD-10-CM

## 2021-01-28 DIAGNOSIS — Z20822 Contact with and (suspected) exposure to covid-19: Secondary | ICD-10-CM | POA: Diagnosis present

## 2021-01-28 DIAGNOSIS — Z66 Do not resuscitate: Secondary | ICD-10-CM | POA: Diagnosis present

## 2021-01-28 DIAGNOSIS — I252 Old myocardial infarction: Secondary | ICD-10-CM | POA: Diagnosis not present

## 2021-01-28 DIAGNOSIS — Z888 Allergy status to other drugs, medicaments and biological substances status: Secondary | ICD-10-CM | POA: Diagnosis not present

## 2021-01-28 DIAGNOSIS — Z794 Long term (current) use of insulin: Secondary | ICD-10-CM | POA: Diagnosis not present

## 2021-01-28 DIAGNOSIS — Z9111 Patient's noncompliance with dietary regimen: Secondary | ICD-10-CM

## 2021-01-28 DIAGNOSIS — R0602 Shortness of breath: Secondary | ICD-10-CM | POA: Diagnosis not present

## 2021-01-28 DIAGNOSIS — K219 Gastro-esophageal reflux disease without esophagitis: Secondary | ICD-10-CM | POA: Diagnosis present

## 2021-01-28 DIAGNOSIS — Z955 Presence of coronary angioplasty implant and graft: Secondary | ICD-10-CM

## 2021-01-28 DIAGNOSIS — I5031 Acute diastolic (congestive) heart failure: Secondary | ICD-10-CM | POA: Diagnosis not present

## 2021-01-28 LAB — CBC WITH DIFFERENTIAL/PLATELET
Abs Immature Granulocytes: 0.04 10*3/uL (ref 0.00–0.07)
Basophils Absolute: 0 10*3/uL (ref 0.0–0.1)
Basophils Relative: 1 %
Eosinophils Absolute: 0.2 10*3/uL (ref 0.0–0.5)
Eosinophils Relative: 3 %
HCT: 28.6 % — ABNORMAL LOW (ref 39.0–52.0)
Hemoglobin: 9.1 g/dL — ABNORMAL LOW (ref 13.0–17.0)
Immature Granulocytes: 1 %
Lymphocytes Relative: 11 %
Lymphs Abs: 0.7 10*3/uL (ref 0.7–4.0)
MCH: 31.5 pg (ref 26.0–34.0)
MCHC: 31.8 g/dL (ref 30.0–36.0)
MCV: 99 fL (ref 80.0–100.0)
Monocytes Absolute: 0.7 10*3/uL (ref 0.1–1.0)
Monocytes Relative: 10 %
Neutro Abs: 4.9 10*3/uL (ref 1.7–7.7)
Neutrophils Relative %: 74 %
Platelets: 214 10*3/uL (ref 150–400)
RBC: 2.89 MIL/uL — ABNORMAL LOW (ref 4.22–5.81)
RDW: 13.1 % (ref 11.5–15.5)
WBC: 6.6 10*3/uL (ref 4.0–10.5)
nRBC: 0 % (ref 0.0–0.2)

## 2021-01-28 LAB — BASIC METABOLIC PANEL
Anion gap: 11 (ref 5–15)
BUN: 53 mg/dL — ABNORMAL HIGH (ref 8–23)
CO2: 25 mmol/L (ref 22–32)
Calcium: 9 mg/dL (ref 8.9–10.3)
Chloride: 97 mmol/L — ABNORMAL LOW (ref 98–111)
Creatinine, Ser: 3.31 mg/dL — ABNORMAL HIGH (ref 0.61–1.24)
GFR, Estimated: 19 mL/min — ABNORMAL LOW (ref >60)
Glucose, Bld: 186 mg/dL — ABNORMAL HIGH (ref 70–99)
Potassium: 3.6 mmol/L (ref 3.5–5.1)
Sodium: 133 mmol/L — ABNORMAL LOW (ref 135–145)

## 2021-01-28 LAB — PROTIME-INR
INR: 1 (ref 0.8–1.2)
Prothrombin Time: 13.2 seconds (ref 11.4–15.2)

## 2021-01-28 LAB — BRAIN NATRIURETIC PEPTIDE: B Natriuretic Peptide: 1266.4 pg/mL — ABNORMAL HIGH (ref 0.0–100.0)

## 2021-01-28 LAB — TROPONIN I (HIGH SENSITIVITY)
Troponin I (High Sensitivity): 760 ng/L (ref <18)
Troponin I (High Sensitivity): 870 ng/L (ref <18)

## 2021-01-28 MED ORDER — HEPARIN (PORCINE) 25000 UT/250ML-% IV SOLN
1400.0000 [IU]/h | INTRAVENOUS | Status: DC
  Administered 2021-01-29: 1300 [IU]/h via INTRAVENOUS
  Administered 2021-01-29: 1100 [IU]/h via INTRAVENOUS
  Filled 2021-01-28 (×2): qty 250

## 2021-01-28 MED ORDER — HEPARIN BOLUS VIA INFUSION
4000.0000 [IU] | Freq: Once | INTRAVENOUS | Status: AC
  Administered 2021-01-29: 4000 [IU] via INTRAVENOUS
  Filled 2021-01-28: qty 4000

## 2021-01-28 NOTE — Progress Notes (Signed)
ANTICOAGULATION CONSULT NOTE - Initial Consult  Pharmacy Consult for Heparin Indication: chest pain/ACS  Allergies  Allergen Reactions   Atorvastatin Other (See Comments)    Leg pain   Ibuprofen Hives   Pravastatin Other (See Comments)    Leg pain   Simvastatin Other (See Comments)    Leg pain    Patient Measurements: Height: 5\' 8"  (172.7 cm) Weight: 88.5 kg (195 lb) IBW/kg (Calculated) : 68.4 Heparin Dosing Weight: 86.4 kg  Vital Signs: Temp: 98.1 F (36.7 C) (08/08 2056) Temp Source: Oral (08/08 2056) BP: 161/66 (08/08 2200) Pulse Rate: 57 (08/08 2200)  Labs: Recent Labs    01/28/21 1829  HGB 9.1*  HCT 28.6*  PLT 214  LABPROT 13.2  INR 1.0  CREATININE 3.31*  TROPONINIHS 760*    Estimated Creatinine Clearance: 20.5 mL/min (A) (by C-G formula based on SCr of 3.31 mg/dL (H)).   Medical History: Past Medical History:  Diagnosis Date   Anemia    Aortic sclerosis  - without Stenosis  March 2014   Echo: Aortic sclerosis without stenosis. EF 55-60% with normal WM; mild concentric hypertrophy with normal relaxation.   Arthritis    CAD (coronary artery disease), autologous vein bypass graft 09/2014   10/03/14: Cath for Class III-IV Angina -- 80% ostial-proximal SVG-RPDA (minimal flow down native PDA from native RCA with 50% proximal and distal, patent LIMA-LAD, SVG-OM with retrograde filling of OM 2. --> 10/12/14: Staged PCI of SVG-RPDA - Xience DES  3.0 mm x 38 mm; (3.4  - 3.3 mm)   CAD S/P percutaneous coronary angioplasty 06/12/2011; 09/2104   a) 2012: Complex PCI mRCA ([facility]) --  2 overlapping Promus element DES stents.; 09/2014:  ost-prox SVG-rPDA - Xience DES 3.0 mm x 38 mm; (3.4  - 3.3 mm)   CHF (congestive heart failure) (HCC)    Childhood asthma    CKD (chronic kidney disease), stage III ([place])    "mild; related to diabetes" (10/14/5359)   Complication of anesthesia    "I've had name recall recognition problems since my last anesthesia"   DM (diabetes  mellitus) type II uncontrolled with eye manifestation ([place]) 2013   Diabetic retinopathy with retinal hemorrhages since;, recurrent in August 2014 treated with Avastin shots   Dyspnea    mainly with exertion   GERD (gastroesophageal reflux disease)    Heart murmur    questionable   High cholesterol    Hypertension    Migraine ~ 2004   "once"   PAD (peripheral artery disease) (HCC)    with claudication   PONV (postoperative nausea and vomiting)    s/p tonsillectomy   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 07/01/2013 (Cath for Crescendo Unstable Angina) --> [name]: For LM disease; LIMA-LAD, SVG-RCA, SCG-Cx; b. 09/2014 Cath/Staged PCI: patent LIMA->LAD, VG->OM1/RI, VG->RCA 80% (3.0x38 Xience Alpine DES on 10/12/2014).   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  Likely thromboembolic event with PDA occlusion. 06/12/2011   Post Complex PCI, pt states no mi   Wears glasses    reading    Medications:  (Not in a hospital admission)  Scheduled:  Infusions:   Assessment: 89 yom with above history presenting with worsening shortness of breath. Pharmacy consulted for heparin dosing for ACS.  Patient not on anticoagulation prior to arrival. Hgb 9.1; plt 214 - baseline. PT/INR 13.2/1.  Goal of Therapy:  Heparin level 0.3-0.7 units/ml Monitor platelets by anticoagulation protocol: Yes   Plan:  Give 4000 units bolus x 1 Start heparin infusion at 1100 units/hr Check anti-Xa level in 6-8 hours and daily while on heparin Continue to monitor H&H and platelets  Lorelei Pont, PharmD, BCPS 01/28/2021 10:06 PM ED Clinical Pharmacist -  [phone]

## 2021-01-28 NOTE — ED Notes (Signed)
Assumed care for pt, ambulated to bathroom and got him back in bed with warm blankets prior to starting Heparin drip administrations when pt informed this RN that the attending provider that just saw him said he would not be getting Heparin since he I refusing to get dialysis. Heparin held at this time. Provider paged and awaiting confirmation on orders.

## 2021-01-28 NOTE — ED Provider Notes (Signed)
Emergency Medicine Provider Triage Evaluation Note  Lance Collier , a 75 y.o. male  was evaluated in triage.  Pt with history of CAD, diabetes, peripheral artery disease anticoagulated with aspirin and Plavix who presents with concern for worsening shortness of breath x3 days.  Mildly productive cough.  Review of Systems  Positive: Cough, shortness of breath, DOE Negative: Chest pain, palpitations, nausea, vomiting, syncope  Physical Exam  BP (!) 148/60 (BP Location: Right Arm)   Pulse (!) 53   Temp 98.2 F (36.8 C) (Oral)   Resp 16   Ht 5\' 8"  (1.727 m)   Wt 88.5 kg   SpO2 97%   BMI 29.65 kg/m  Gen:   Awake, no distress   Resp:  Normal effort  MSK:   Moves extremities without difficulty  Other:  RRR no M/R/G.  Lungs CTA B.  Bilateral 3+ lower extremity edema  Medical Decision Making  Medically screening exam initiated at 6:34 PM.  Appropriate orders placed.  Lance Collier was informed that the remainder of the evaluation will be completed by another provider, this initial triage assessment does not replace that evaluation, and the importance of remaining in the ED until their evaluation is complete.  This chart was dictated using voice recognition software, Dragon. Despite the best efforts of this provider to proofread and correct errors, errors may still occur which can change documentation meaning.    Aura Dials 01/28/21 1835    Teressa Lower, MD 01/28/21 2201

## 2021-01-28 NOTE — ED Provider Notes (Signed)
[facility] EMERGENCY DEPARTMENT Provider Note   CSN: [id number] Arrival date & time: 01/28/21  1805     History Chief Complaint  Patient presents with   Shortness of Breath   Weakness    Lance Collier is a 75 y.o. male.   Shortness of Breath Associated symptoms: no abdominal pain, no chest pain and no rash   Weakness Associated symptoms: shortness of breath   Associated symptoms: no abdominal pain and no chest pain   Patient presents with shortness of breath.  Has had for the last few days.  At baseline he does have some difficulty with exertion.  States that he is able to walk to the mailbox and back but that is about it at baseline.  States he is now having much more trouble doing that.  Not having chest pain.  But states he cannot do the same activity.  States he gets shortness of breath.  Does have history of coronary artery disease.  Also history of CABG.  Reviewing records it appears as if he has had issues with diuresis in the past and that his kidney function worsened.  States he feels as if his weight is actually decreased from baseline.    Past Medical History:  Diagnosis Date   Anemia    Aortic sclerosis  - without Stenosis  March 2014   Echo: Aortic sclerosis without stenosis. EF 55-60% with normal WM; mild concentric hypertrophy with normal relaxation.   Arthritis    CAD (coronary artery disease), autologous vein bypass graft 09/2014   10/03/14: Cath for Class III-IV Angina -- 80% ostial-proximal SVG-RPDA (minimal flow down native PDA from native RCA with 50% proximal and distal, patent LIMA-LAD, SVG-OM with retrograde filling of OM 2. --> 10/12/14: Staged PCI of SVG-RPDA - Xience DES  3.0 mm x 38 mm; (3.4  - 3.3 mm)   CAD S/P percutaneous coronary angioplasty 06/12/2011; 09/2104   a) 2012: Complex PCI mRCA ([facility]) --  2 overlapping Promus element DES stents.; 09/2014:  ost-prox SVG-rPDA - Xience DES 3.0 mm x 38 mm; (3.4  - 3.3 mm)   CHF  (congestive heart failure) (HCC)    Childhood asthma    CKD (chronic kidney disease), stage III ([place])    "mild; related to diabetes" (7/98/9211)   Complication of anesthesia    "I've had name recall recognition problems since my last anesthesia"   DM (diabetes mellitus) type II uncontrolled with eye manifestation ([place]) 2013   Diabetic retinopathy with retinal hemorrhages since;, recurrent in August 2014 treated with Avastin shots   Dyspnea    mainly with exertion   GERD (gastroesophageal reflux disease)    Heart murmur    questionable   High cholesterol    Hypertension    Migraine ~ 2004   "once"   PAD (peripheral artery disease) (HCC)    with claudication   PONV (postoperative nausea and vomiting)    s/p tonsillectomy   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 07/01/2013 (Cath for Crescendo Unstable Angina) --> [name]: For LM disease; LIMA-LAD, SVG-RCA, SCG-Cx; b. 09/2014 Cath/Staged PCI: patent LIMA->LAD, VG->OM1/RI, VG->RCA 80% (3.0x38 Xience Alpine DES on 10/12/2014).   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  Likely thromboembolic event with PDA occlusion. 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   Dizziness of unknown cause 03/06/2016   Atherosclerotic peripheral vascular disease with intermittent claudication ([place]) 07/05/2015   Bilateral carotid artery occlusion 07/05/2015   Chronic kidney disease (CKD), stage IV (severe) ([place]) 10/13/2014   CAD S/P PCI SVG-RCA Xience Alpine DES 3.0 mm x 38 mm  (ostial-prox) - 3.41mm 10/12/2014    Class: Status post   Chronic diastolic heart failure ([place]) 09/21/2014   Encounter for long-term (current) use of medications 08/01/2014   Asymptomatic stenosis of left carotid artery without infarction 12/22/2013   Episodic memory loss 11/05/2013   Edema of both legs 08/06/2013   PAD (peripheral artery disease) both Rt and Lt disease with Lt ICA of 60-79% 07/20/2013   S/P CABG (coronary artery bypass graft)  x 3, 07/01/13, LIMA-LAD; VG-RCA; VG- LCX 07/01/2013   Anemia    Left main coronary artery disease; with RCA in-stent restenosis 06/28/2013   Obesity (BMI 30-39.9) 03/19/2013   DOE (dyspnea on exertion) 06/12/2011   Coronary artery disease involving coronary bypass graft with angina pectoris ([place]) 06/12/2011   Diabetes mellitus ([place]) 06/12/2011   Essential hypertension 06/12/2011   Hyperlipidemia with target LDL less than 70; intolerance to atorvastatin and simvastatin 06/12/2011    Past Surgical History:  Procedure Laterality Date   ABDOMINAL AORTOGRAM W/LOWER EXTREMITY N/A 02/05/2018   Procedure: ABDOMINAL AORTOGRAM W/LOWER EXTREMITY;  Surgeon: Elam Dutch, MD;  Location: [place] [facility];  Service: Cardiovascular;  Laterality: N/A;   COLONOSCOPY     CORONARY ARTERY BYPASS GRAFT N/A 07/01/2013   Procedure: CORONARY ARTERY BYPASS GRAFTING (CABG);  Surgeon: Grace Isaac, MD;  Location: [place];  Service: Open Heart Surgery;  Laterality: N/A;  Times 3 using left internal mammary artery and endoscopically harvested bilateral saphenous vein   ENDARTERECTOMY Left 05/21/2016   Procedure: LEFT CAROTID ENDARTERECTOMY;  Surgeon: Elam Dutch, MD;  Location: [place];  Service: Vascular;  Laterality: Left;   ENDARTERECTOMY FEMORAL Left 02/23/2018   Procedure: LEFT COMMON FEMORAL ENDARTERECTOMY;  Surgeon: Elam Dutch, MD;  Location: [facility] OR;  Service: Vascular;  Laterality: Left;   EYE SURGERY Bilateral    ioc for cataracts   INSERTION OF ILIAC  STENT Bilateral 02/23/2018   Procedure: INSERTION OF BILATERAL COMMON ILIAC STENTS WITH VIABAHN STENTS;  Surgeon: Elam Dutch, MD;  Location: [place];  Service: Vascular;  Laterality: Bilateral;   INTRAOPERATIVE TRANSESOPHAGEAL ECHOCARDIOGRAM N/A 07/01/2013   Procedure: INTRAOPERATIVE TRANSESOPHAGEAL ECHOCARDIOGRAM;  Surgeon: Grace Isaac, MD;  Location: [place];  Service: Open Heart Surgery;  Laterality: N/A;   LEFT AND RIGHT HEART CATHETERIZATION WITH CORONARY/GRAFT ANGIOGRAM N/A 10/03/2014   Procedure: LEFT AND RIGHT HEART CATHETERIZATION WITH Beatrix Fetters;  Surgeon: Leonie Man, MD;  Location: [facility] CATH LAB;  Service: Cardiovascular; severe prox RCA 80% (dRCA w/ competetive flow & 60% hazy ISR in Native RCA), patent LIMA-LAD  & SVG-OM1-RI - retrograde fills OM2, known native LM & mod OM2.  Severe Systemic HTN - LVEDP/PCWP 22 & 28 mmHg, Normal CO/CI   LEFT HEART CATHETERIZATION WITH CORONARY ANGIOGRAM N/A 06/11/2011   Procedure: LEFT HEART CATHETERIZATION WITH CORONARY ANGIOGRAM;  Surgeon: Fulton Reek, MD;  Location: [facility] CATH LAB;;  Dense LM & prox LAD, prox RCA calcification. Large bifurcating OM1 & small OM2 normal. Minor LAD & D1 dz, tortuous prox-mid RCA w/ 60% &  subtotal occluded dRCA w/ normal PDA & PLV    LEFT HEART CATHETERIZATION WITH CORONARY ANGIOGRAM N/A 06/28/2013   Procedure: LEFT HEART CATHETERIZATION WITH CORONARY ANGIOGRAM;  Surgeon: Leonie Man, MD;  Location: [facility] CATH LAB;  Service: Cardiovascular;  ostial LM disese, RCA ~40-50%ISR   NM MYOVIEW LTD  04/29/2018   LEXISCAN: Non-gated study with small fixed moderate intensity defect in the apical inferior wall suspicious for either small area of infarction versus attenuation (no change from previous study).  LOW RISK.    PATCH ANGIOPLASTY Left 05/21/2016   Procedure: PATCH ANGIOPLASTY USING HEMASHIELD PLATINUM FINESSE 0.3in x 3 in;  Surgeon: Elam Dutch, MD;  Location: [place];  Service: Vascular;  Laterality:  Left;   PATCH ANGIOPLASTY Left 02/23/2018   Procedure: PATCH ANGIOPLASTY LEFT COMMON FEMORAL ARTERY;  Surgeon: Elam Dutch, MD;  Location: [facility] OR;  Service: Vascular;  Laterality: Left;   PERCUTANEOUS CORONARY STENT INTERVENTION (PCI-S)  06/11/2011   Procedure: PERCUTANEOUS CORONARY STENT INTERVENTION (PCI-S);  Surgeon: Fulton Reek, MD;  Location: [facility] CATH LAB;  Service: Cardiovascular;;2 overlapping Promus DES 2.82mm at 12 mm x2; postdilated to 3 mm   PERCUTANEOUS CORONARY STENT INTERVENTION (PCI-S) N/A 10/12/2014   Procedure: PERCUTANEOUS CORONARY STENT INTERVENTION (PCI-S);  Surgeon: Leonie Man, MD;  Location: [facility] CATH LAB;  Ostial-Prox SVG-RCA Xience Alpine DES 3.0 mm x 38 mm; (3.4  - 3.3 mm)   RETINAL LASER PROCEDURE Bilateral    "more than once"    TONSILLECTOMY  as child   TRANSTHORACIC ECHOCARDIOGRAM  03/2017    EF 55-60%.  Normal thickness.  No R WMA..  GR 2 DD.  Mild-mod calcified aortic valve.  Mild RV systolic dysfunction.  Moderate RV dilation.  Moderately increased PA pressures.;;; 07/2019: EF 65%.  No WMA.  Unable to assess diastolic marginal RV PA pressures.  Mild to moderate LA dilation with moderate mild aortic valve sclerosis but no stenosis.       Family History  Problem Relation Age of Onset   Lung cancer Sister    Breast cancer Sister    Breast cancer Mother    Hyperlipidemia Father    Hypertension Father    Throat cancer Father    Colon polyps Father    Liver cancer Maternal Grandmother    Stomach cancer Neg Hx    Rectal cancer Neg Hx    Colon cancer Neg Hx    Esophageal cancer Neg Hx     Social History   Tobacco Use   Smoking status: Former    Packs/day: 2.00    Years: 27.00    Pack years: 54.00    Types: Cigarettes    Quit date: 11/23/1996    Years since quitting: 24.1   Smokeless tobacco: Never  Vaping Use   Vaping Use: Never used  Substance Use Topics   Alcohol use: Yes    Comment: rarely   Drug use: No    Home Medications Prior to  Admission medications   Medication Sig Start Date End Date Taking? Authorizing Provider  acetaminophen (TYLENOL) 325 MG tablet Take 650 mg by mouth daily as needed for mild pain or headache.     [provider]  amLODipine (NORVASC) 10 MG tablet Take 1 tablet by mouth daily. 02/26/19   [provider]  carvedilol (COREG) 12.5 MG  tablet TAKE 1 TABLET BY MOUTH TWICE DAILY. 08/21/20   Leonie Man, MD  clopidogrel (PLAVIX) 75 MG tablet TAKE 1 TABLET DAILY WITH BREAKFAST. 06/06/20   Leonie Man, MD  Continuous Blood Gluc Sensor (FREESTYLE LIBRE 14 DAY SENSOR) MISC MONITOR BS CONTINUOUSLY  CHANGE Q 14 DAYS. 11/28/18   [provider]  docusate sodium (COLACE) 100 MG capsule Take 100 mg by mouth daily as needed for mild constipation.     [provider]  fenofibrate 160 MG tablet TAKE 1 TABLET ONCE DAILY. 12/10/20   Leonie Man, MD  furosemide (LASIX) 80 MG tablet Take 160 mg by mouth 2 (two) times daily. MAY TAKE AN EXTRA 80 MG IF NEEDED FOR INCREASE SWELLING OR SHORTNESS OF BREATH LYING DOWN.    [provider]  HUMALOG KWIKPEN 200 UNIT/ML SOPN Inject 20-28 Units into the skin See admin instructions. Use 20 units (base) per meal increase units given by 1 unit for every 15 increment over 150 (I.E. Blood glucose reading 165=21 units, 180=22 units, etc. ) 11/07/17   [provider]  hydrALAZINE (APRESOLINE) 100 MG tablet TAKE ONE TABLET BY MOUTH THREE TIMES DAILY 11/12/20   Leonie Man, MD  isosorbide mononitrate (IMDUR) 30 MG 24 hr tablet TAKE 1 TABLET ONCE DAILY IN THE EVENING. 10/24/20   Leonie Man, MD  isosorbide mononitrate (IMDUR) 60 MG 24 hr tablet TAKE 1 TABLET ONCE DAILY. 07/26/20   Leonie Man, MD  ketoconazole (NIZORAL) 2 % cream As directed 09/06/19   [provider]  pantoprazole (PROTONIX) 40 MG tablet TAKE 1 TABLET ONCE DAILY. 09/19/20   Leonie Man, MD  REPATHA SURECLICK 536 MG/ML SOAJ INJECT CONTENTS OF 1 PEN  SUBCUTANEOUSLY EVERY 14 DAYS. 07/03/20   Leonie Man, MD  rosuvastatin (CRESTOR) 10 MG tablet TAKE 1 TABLET ON TUESDAY, THURSDAY, SATURDAY AND SUNDAY. 09/19/20   Leonie Man, MD  TOUJEO MAX SOLOSTAR 300 UNIT/ML SOPN Inject 66 Units into the skin 2 (two) times daily.  11/07/17   [provider]  VASCEPA 1 G CAPS Take 2 g by mouth 2 (two) times daily.  01/31/15   [provider]    Allergies    Atorvastatin, Ibuprofen, Pravastatin, and Simvastatin  Review of Systems   Review of Systems  Constitutional:  Negative for appetite change.  HENT:  Negative for congestion.   Respiratory:  Positive for shortness of breath.   Cardiovascular:  Positive for leg swelling. Negative for chest pain.  Gastrointestinal:  Negative for abdominal pain.  Genitourinary:  Negative for flank pain.  Musculoskeletal:  Negative for back pain.  Skin:  Negative for rash.  Neurological:  Positive for weakness.  Psychiatric/Behavioral:  Negative for confusion.    Physical Exam Updated Vital Signs BP (!) 161/66   Pulse (!) 57   Temp 98.1 F (36.7 C) (Oral)   Resp 18   Ht 5\' 8"  (1.727 m)   Wt 88.5 kg   SpO2 98%   BMI 29.65 kg/m   Physical Exam Vitals and nursing note reviewed.  HENT:     Head: Normocephalic.  Neck:     Vascular: No JVD.  Cardiovascular:     Rate and Rhythm: Normal rate and regular rhythm.  Pulmonary:     Breath sounds: Rales present.  Chest:     Chest wall: No tenderness.  Musculoskeletal:     Right lower leg: Edema present.     Left lower leg: Edema present.  Skin:    General: Skin is warm.     Capillary Refill: Capillary refill takes less than 2 seconds.  Neurological:     Mental Status: He is alert and oriented to person, place, and time.    ED Results / Procedures / Treatments   Labs (all labs ordered are listed, but only abnormal results are displayed) Labs Reviewed  BASIC METABOLIC PANEL - Abnormal; Notable for the following components:       Result Value   Sodium 133 (*)    Chloride 97 (*)    Glucose, Bld 186 (*)    BUN 53 (*)    Creatinine, Ser 3.31 (*)    GFR, Estimated 19 (*)    All other components within normal limits  CBC WITH DIFFERENTIAL/PLATELET - Abnormal; Notable for the following components:   RBC 2.89 (*)    Hemoglobin 9.1 (*)    HCT 28.6 (*)    All other components within normal limits  BRAIN NATRIURETIC PEPTIDE - Abnormal; Notable for the following components:   B Natriuretic Peptide 1,266.4 (*)    All other components within normal limits  TROPONIN I (HIGH SENSITIVITY) - Abnormal; Notable for the following components:   Troponin I (High Sensitivity) 760 (*)    All other components within normal limits  TROPONIN I (HIGH SENSITIVITY) - Abnormal; Notable for the following components:   Troponin I (High Sensitivity) 870 (*)    All other components within normal limits  RESP PANEL BY RT-PCR (FLU A&B, COVID) ARPGX2  PROTIME-INR  HEPARIN LEVEL (UNFRACTIONATED)    EKG EKG Interpretation  Date/Time:  Monday January 28 2021 18:10:45 EDT Ventricular Rate:  52 PR Interval:  236 QRS Duration: 116 QT Interval:  466 QTC Calculation: 433 R Axis:   76 Text Interpretation: Sinus bradycardia with marked sinus arrhythmia with 1st degree A-V block Minimal voltage criteria for LVH, may be normal variant ( Sokolow-Lyon ) Marked ST abnormality, possible inferolateral subendocardial injury Abnormal ECG Confirmed by Davonna Belling [phone]) on 01/28/2021 6:20:30 PM  Radiology DG Chest 2 View  Result Date: 01/28/2021 CLINICAL DATA:  Shortness of breath and weakness EXAM: CHEST - 2 VIEW COMPARISON:  07/22/2019 FINDINGS: Cardiac shadow is mildly enlarged but stable. Aortic calcifications are again seen. Postsurgical changes are noted. The lungs are well aerated bilaterally. Mild vascular congestion is seen without significant interstitial edema. Mild blunting of the costophrenic angles is noted and stable. No bony abnormality  is noted. IMPRESSION: Mild CHF without significant edema. Electronically Signed   By: Inez Catalina M.D.   On: 01/28/2021 19:17    Procedures Procedures   Medications Ordered in ED Medications  heparin ADULT infusion 100 units/mL (25000 units/222mL) (has no administration in time range)  heparin bolus via infusion 4,000 Units (has no administration in time range)    ED Course  I have reviewed the triage vital signs and the nursing notes.  Pertinent labs & imaging results that were available during my care of the patient were reviewed by me and considered in my medical decision making (see chart for details).    MDM Rules/Calculators/A&P                           Patient with shortness of breath.  Has worsened over the last few days.  States he is not able to sleep as well at night.  States is worsening shortness of breath when he lays flat.  Does have swelling on  his legs but feels overall as if he has had a decrease in his weight.  EKG has nonspecific changes.  Chest x-ray shows mild CHF.  BNP is elevated as is troponin.  Patient overall appears to have some CHF but his weight is down from where it was 6 months ago in the cardiology office.  May just have for fluid in the wrong spot.  The shortness of breath however may be an anginal equivalent.  Will admit to internal medicine with cardiology consult.  Dr. Alfred Levins will see patient.  We will start patient on heparin more than non-STEMI.  CRITICAL CARE Performed by: Davonna Belling Total critical care time: 30 minutes Critical care time was exclusive of separately billable procedures and treating other patients. Critical care was necessary to treat or prevent imminent or life-threatening deterioration. Critical care was time spent personally by me on the following activities: development of treatment plan with patient and/or surrogate as well as nursing, discussions with consultants, evaluation of patient's response to treatment, examination  of patient, obtaining history from patient or surrogate, ordering and performing treatments and interventions, ordering and review of laboratory studies, ordering and review of radiographic studies, pulse oximetry and re-evaluation of patient's condition.  Final Clinical Impression(s) / ED Diagnoses Final diagnoses:  Congestive heart failure, unspecified HF chronicity, unspecified heart failure type [facility])  NSTEMI (non-ST elevated myocardial infarction) [facility])    Rx / DC Orders ED Discharge Orders     None        Davonna Belling, MD 01/28/21 2245

## 2021-01-28 NOTE — Telephone Encounter (Signed)
Sounds like he may very well be having some heart failure symptoms being volume overloaded.  It is possible that he may be having angina.  Very difficult to assess.  I agree that he needs to go to emergency room, but understand his frustration.  I think despite best sorted out and treated in the inpatient setting.  If he does not go to the emergency room, we can potentially get him in to be seen ASAP to have him direct admitted.  Glenetta Hew, MD

## 2021-01-28 NOTE — Consult Note (Signed)
Cardiology Consultation:   Patient ID: Lance Collier MRN: [id number]; DOB: Nov 11, 1945  Admit date: 01/28/2021 Date of Consult: 01/28/2021  PCP:  Lance Bowen, MD   Lance Collier Cardiologist:  Lance Hew, MD        Patient Profile:   Lance Collier is a 75 y.o. male with a hx of CAD (PCI followed by CABG then PCI to SVG-LCx and RCA), HFpEF, CKD-4, DM-2 with PN and retinopathy, severe PAD with limiting claudication who is being seen 01/28/2021 for the evaluation of SOB at the request of ER.  MV CAD:   December 2012: 2 overlapping DES to RCA; January 2015: CABG Lance Collier, SVG-Cx); April 2016 -DES PCI to SVG-RCA. PAD -most recently left femoral endarterectomy with bilateral common iliac stenting and profundoplasty September 2019 -> unfortunately not much notable improvement in symptoms Carotid Artery Disease - Left Carotid Endarterectomy November 0762 Chronic Diastolic Heart Failure CKD 4    History of Present Illness:   [name] presents for evaluation of SOB/DOE. Patient reports 2-day worsening shortness of breathing at rest or with exertion. He reports some PND and worsening lower extremity edema (noticed socks were tightening yesterday). He does not check his weight daily. Reports there is no change in his appetite. Denies fever, chills, dizziness, syncope, cough, chest discomfort, heart palpitations, nausea, vomiting, upper abdominal pain, heartburn unrelated to meals, abdominal fullness, dysuria, diarrhea, or any bleeding events. Reports compliant with his medications. Currently, breathing is much improved since came to ER.   Na 133, Cr 3.31 at his baseline, BNP 1266.4, troponin 760->870 CXR Mild CHF without significant edema.   Past Medical History:  Diagnosis Date   Anemia    Aortic sclerosis  - without Stenosis  March 2014   Echo: Aortic sclerosis without stenosis. EF 55-60% with normal WM; mild concentric hypertrophy with normal relaxation.    Arthritis    CAD (coronary artery disease), autologous vein bypass graft 09/2014   10/03/14: Cath for Class III-IV Angina -- 80% ostial-proximal SVG-RPDA (minimal flow down native PDA from native RCA with 50% proximal and distal, patent LIMA-LAD, SVG-OM with retrograde filling of OM 2. --> 10/12/14: Staged PCI of SVG-RPDA - Xience DES  3.0 mm x 38 mm; (3.4  - 3.3 mm)   CAD S/P percutaneous coronary angioplasty 06/12/2011; 09/2104   a) 2012: Complex PCI mRCA ([place]) --  2 overlapping Promus element DES stents.; 09/2014:  ost-prox SVG-rPDA - Xience DES 3.0 mm x 38 mm; (3.4  - 3.3 mm)   CHF (congestive heart failure) (HCC)    Childhood asthma    CKD (chronic kidney disease), stage III ([place])    "mild; related to diabetes" (2/63/3354)   Complication of anesthesia    "I've had name recall recognition problems since my last anesthesia"   DM (diabetes mellitus) type II uncontrolled with eye manifestation ([place]) 2013   Diabetic retinopathy with retinal hemorrhages since;, recurrent in August 2014 treated with Avastin shots   Dyspnea    mainly with exertion   GERD (gastroesophageal reflux disease)    Heart murmur    questionable   High cholesterol    Hypertension    Migraine ~ 2004   "once"   PAD (peripheral artery disease) (HCC)    with claudication   PONV (postoperative nausea and vomiting)    s/p tonsillectomy   S/P CABG x 3 07/01/2013   a. 07/01/2013 (Cath for Crescendo Unstable Angina) --> Lance Collier: For LM disease; LIMA-LAD, SVG-RCA, SCG-Cx; b. 09/2014 Cath/Staged PCI: patent  LIMA->LAD, VG->OM1/RI, VG->RCA 80% (3.0x38 Xience Alpine DES on 10/12/2014).   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  Likely thromboembolic event with PDA occlusion. 06/12/2011   Post Complex PCI, pt states no mi   Wears glasses    reading    Past Surgical History:  Procedure Laterality Date   ABDOMINAL AORTOGRAM W/LOWER EXTREMITY N/A 02/05/2018   Procedure:  ABDOMINAL AORTOGRAM W/LOWER EXTREMITY;  Surgeon: Elam Dutch, MD;  Location: [facility];  Service: Cardiovascular;  Laterality: N/A;   COLONOSCOPY     CORONARY ARTERY BYPASS GRAFT N/A 07/01/2013   Procedure: CORONARY ARTERY BYPASS GRAFTING (CABG);  Surgeon: Lance Isaac, MD;  Location: [facility];  Service: Open Heart Surgery;  Laterality: N/A;  Times 3 using left internal mammary artery and endoscopically harvested bilateral saphenous vein   ENDARTERECTOMY Left 05/21/2016   Procedure: LEFT CAROTID ENDARTERECTOMY;  Surgeon: Elam Dutch, MD;  Location: [facility];  Service: Vascular;  Laterality: Left;   ENDARTERECTOMY FEMORAL Left 02/23/2018   Procedure: LEFT COMMON FEMORAL ENDARTERECTOMY;  Surgeon: Elam Dutch, MD;  Location: [facility];  Service: Vascular;  Laterality: Left;   EYE SURGERY Bilateral    ioc for cataracts   INSERTION OF ILIAC STENT Bilateral 02/23/2018   Procedure: INSERTION OF BILATERAL COMMON ILIAC STENTS WITH VIABAHN STENTS;  Surgeon: Elam Dutch, MD;  Location: [facility];  Service: Vascular;  Laterality: Bilateral;   INTRAOPERATIVE TRANSESOPHAGEAL ECHOCARDIOGRAM N/A 07/01/2013   Procedure: INTRAOPERATIVE TRANSESOPHAGEAL ECHOCARDIOGRAM;  Surgeon: Lance Isaac, MD;  Location: [facility];  Service: Open Heart Surgery;  Laterality: N/A;   LEFT AND RIGHT HEART CATHETERIZATION WITH CORONARY/GRAFT ANGIOGRAM N/A 10/03/2014   Procedure: LEFT AND RIGHT HEART CATHETERIZATION WITH Lance Collier;  Surgeon: Lance Man, MD;  Location[facility] C[facility] LAB;  Service: Cardiovascular; severe prox RCA 80% (dRCA w/ competetive flow & 60% hazy ISR in Native RCA), patent LIMA-LAD  & SVG-OM1-RI - retrograde fills OM2, known native LM & mod OM2.  Severe Systemic HTN - LVEDP/PCWP 22 & 28 mmHg, Normal CO/CI   LEFT HEART CATHETERIZATION WITH CORONARY ANGIOGRAM N/A 06/11/2011   Procedure: LEFT HEART CATHETERIZATION WITH CORONARY ANGIOGRAM;  Surgeon: Lance Reek, MD;  Location: [facility] CATH  LAB;;  Dense LM & prox LAD, prox RCA calcification. Large bifurcating OM1 & small OM2 normal. Minor LAD & D1 dz, tortuous prox-mid RCA w/ 60% &  subtotal occluded dRCA w/ normal PDA & PLV    LEFT HEART CATHETERIZATION WITH CORONARY ANGIOGRAM N/A 06/28/2013   Procedure: LEFT HEART CATHETERIZATION WITH CORONARY ANGIOGRAM;  Surgeon: Lance Man, MD;  Location: [facility] CATH LAB;  Service: Cardiovascular;  ostial LM disese, RCA ~40-50%ISR   NM MYOVIEW LTD  04/29/2018   LEXISCAN: Non-gated study with small fixed moderate intensity defect in the apical inferior wall suspicious for either small area of infarction versus attenuation (no change from previous study).  LOW RISK.    PATCH ANGIOPLASTY Left 05/21/2016   Procedure: PATCH ANGIOPLASTY USING HEMASHIELD PLATINUM FINESSE 0.3in x 3 in;  Surgeon: Elam Dutch, MD;  Location: [facility];  Service: Vascular;  Laterality: Left;   PATCH ANGIOPLASTY Left 02/23/2018   Procedure: PATCH ANGIOPLASTY LEFT COMMON FEMORAL ARTERY;  Surgeon: Elam Dutch, MD;  Location: [facility] OR;  Service: Vascular;  Laterality: Left;   PERCUTANEOUS CORONARY STENT INTERVENTION (PCI-S)  06/11/2011   Procedure: PERCUTANEOUS CORONARY STENT INTERVENTION (PCI-S);  Surgeon: Lance Reek, MD;  Location: [facility] CATH LAB;  Service: Cardiovascular;;2 overlapping Promus DES 2.29mm at 12 mm x2; postdilated to 3 mm   PERCUTANEOUS CORONARY STENT INTERVENTION (PCI-S) N/A 10/12/2014   Procedure: PERCUTANEOUS CORONARY STENT INTERVENTION (PCI-S);  Surgeon: Lance Man, MD;  Location: [facility] CATH LAB;  Ostial-Prox SVG-RCA Xience Alpine DES 3.0 mm x 38 mm; (3.4  - 3.3 mm)   RETINAL LASER PROCEDURE Bilateral    "more than once"    TONSILLECTOMY  as child   TRANSTHORACIC ECHOCARDIOGRAM  03/2017    EF 55-60%.  Normal thickness.  No R WMA..  GR 2 DD.  Mild-mod calcified aortic valve.  Mild RV systolic dysfunction.  Moderate RV dilation.  Moderately increased PA pressures.;;; 07/2019: EF 65%.  No WMA.  Unable to  assess diastolic marginal RV PA pressures.  Mild to moderate LA dilation with moderate mild aortic valve sclerosis but no stenosis.     Home Medications:  Prior to Admission medications   Medication Sig Start Date End Date Taking? Authorizing Provider  acetaminophen (TYLENOL) 325 MG tablet Take 650 mg by mouth daily as needed for mild pain or headache.     [provider]  amLODipine (NORVASC) 10 MG tablet Take 1 tablet by mouth daily. 02/26/19   [provider]  carvedilol (COREG) 12.5 MG tablet TAKE 1 TABLET BY MOUTH TWICE DAILY. 08/21/20   Lance Man, MD  clopidogrel (PLAVIX) 75 MG tablet TAKE 1 TABLET DAILY WITH BREAKFAST. 06/06/20   Lance Man, MD  Continuous Blood Gluc Sensor (FREESTYLE LIBRE 14 DAY SENSOR) MISC MONITOR BS CONTINUOUSLY  CHANGE Q 14 DAYS. 11/28/18   [provider]  docusate sodium (COLACE) 100 MG capsule Take 100 mg by mouth daily as needed for mild constipation.     [provider]  fenofibrate 160 MG tablet TAKE 1 TABLET ONCE DAILY. 12/10/20   Lance Man, MD  furosemide (LASIX) 80 MG tablet Take 160 mg by mouth 2 (two) times daily. MAY TAKE AN EXTRA 80 MG IF NEEDED FOR INCREASE SWELLING OR SHORTNESS OF BREATH LYING DOWN.    [provider]  HUMALOG KWIKPEN 200 UNIT/ML SOPN Inject 20-28 Units into the skin See admin instructions. Use 20 units (base) per meal increase units given by 1 unit for every 15 increment over 150 (I.E. Blood glucose reading 165=21 units, 180=22 units, etc. ) 11/07/17   [provider]  hydrALAZINE (APRESOLINE) 100 MG tablet TAKE ONE TABLET BY MOUTH THREE TIMES DAILY 11/12/20   Lance Man, MD  isosorbide mononitrate (IMDUR) 30 MG 24 hr tablet TAKE 1 TABLET ONCE DAILY IN THE EVENING. 10/24/20   Lance Man, MD  isosorbide mononitrate (IMDUR) 60 MG 24 hr tablet TAKE 1 TABLET ONCE DAILY. 07/26/20   Lance Man, MD  ketoconazole (NIZORAL) 2 % cream As directed 09/06/19   [provider]  pantoprazole (PROTONIX) 40 MG tablet TAKE 1 TABLET ONCE DAILY. 09/19/20   Lance Man, MD  REPATHA SURECLICK 469 MG/ML SOAJ INJECT CONTENTS OF 1 PEN SUBCUTANEOUSLY EVERY 14 DAYS. 07/03/20   Lance Man, MD  rosuvastatin (CRESTOR) 10 MG tablet TAKE 1 TABLET ON TUESDAY, THURSDAY, SATURDAY AND SUNDAY. 09/19/20   Lance Man, MD  TOUJEO MAX SOLOSTAR 300 UNIT/ML SOPN Inject 66 Units into the skin 2 (two) times daily.  11/07/17   [provider]  VASCEPA 1 G CAPS Take 2 g by mouth 2 (two) times daily.  01/31/15   [provider]    Inpatient Medications: Scheduled Meds:  heparin  4,000 Units Intravenous Once   Continuous Infusions:  heparin     PRN Meds:   Allergies:    Allergies  Allergen Reactions   Atorvastatin Other (See Comments)    Leg pain   Ibuprofen Hives   Pravastatin Other (See Comments)    Leg pain   Simvastatin Other (See Comments)    Leg pain    Social History:   Social History   Socioeconomic History   Marital status: Married    Spouse name: Not on file   Number of children: Not on file   Years of education: Not on file   Highest education level: Not on file  Occupational History   Occupation: attorney  Tobacco Use   Smoking status: Former    Packs/day: 2.00    Years: 27.00    Pack years: 54.00    Types: Cigarettes    Quit date: 11/23/1996    Years since quitting: 24.1   Smokeless tobacco: Never  Vaping Use   Vaping Use: Never used  Substance and Sexual Activity   Alcohol use: Yes    Comment: rarely   Drug use: No   Sexual activity: Not Currently  Other Topics Concern   Not on file  Social History Narrative   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  He had pretty much been following one last case but not really working for the last year and a half.        He has social alcoholic beverages but is trying to cut that back and does not smoke.       He tells me he is a  sucker for chips but does not eat sweets because of diabetes.   Social Determinants of Health   Financial Resource Strain: Not on file  Food Insecurity: Not on file  Transportation Needs: Not on file  Physical Activity: Not on file  Stress: Not on file  Social Connections: Not on file  Intimate Partner Violence: Not on file    Family History:    Family History  Problem Relation Age of Onset   Lung cancer Sister    Breast cancer Sister    Breast cancer Mother    Hyperlipidemia Father    Hypertension Father    Throat cancer Father    Colon polyps Father    Liver cancer Maternal Grandmother    Stomach cancer Neg Hx    Rectal cancer Neg Hx    Colon cancer Neg Hx    Esophageal cancer Neg Hx      ROS:  Please see the history of present illness.   All other ROS reviewed and negative.     Physical Exam/Data:   Vitals:   01/28/21 2147 01/28/21 2200 01/28/21 2215 01/28/21 2230  BP: (!) 156/62 (!) 161/66 (!) 152/60 (!) 154/60  Pulse:  (!) 57 (!) 55 (!) 58  Resp: 18 18 13 15   Temp:      TempSrc:      SpO2: 97% 98% 98% 95%  Weight:      Height:       No intake or output data in the 24 hours ending 01/28/21 2251 Last 3 Weights 01/28/2021 07/25/2020 04/17/2020  Weight (lbs) 195 lb 197 lb 208 lb 12.8 oz  Weight (kg) 88.451 kg 89.359 kg 94.711 kg     Body mass index is 29.65 kg/m.  General:  Well nourished,  well developed, in no acute distress HEENT: normal Lymph: no adenopathy Neck: JVD 80mmH2O at 60 degrees Endocrine:  No thryomegaly Vascular: No carotid bruits; FA pulses 2+ bilaterally without bruits  Cardiac:  normal S1, S2; RRR; no murmur  Lungs:  clear to auscultation bilaterally, no wheezing, rhonchi or rales  Abd: soft, nontender, no hepatomegaly  Ext: warm, 1+ pitting edema Musculoskeletal:  No deformities, BUE and BLE strength normal and equal Skin: warm  Neuro:  CNs 2-12 intact, no focal abnormalities noted Psych:  Normal affect   EKG:  The EKG was  personally reviewed and demonstrates:  sinus rhythm, 1st AVD, TWI inferior and anterolateral leads Telemetry:  Telemetry was personally reviewed and demonstrates:  sinus rhythm  Relevant CV Studies:   Laboratory Data:  High Sensitivity Troponin:   Recent Labs  Lab 01/28/21 1829 01/28/21 2110  TROPONINIHS 760* 870*     Chemistry Recent Labs  Lab 01/28/21 1829  NA 133*  K 3.6  CL 97*  CO2 25  GLUCOSE 186*  BUN 53*  CREATININE 3.31*  CALCIUM 9.0  GFRNONAA 19*  ANIONGAP 11    No results for input(s): PROT, ALBUMIN, AST, ALT, ALKPHOS, BILITOT in the last 168 hours. Hematology Recent Labs  Lab 01/28/21 1829  WBC 6.6  RBC 2.89*  HGB 9.1*  HCT 28.6*  MCV 99.0  MCH 31.5  MCHC 31.8  RDW 13.1  PLT 214   BNP Recent Labs  Lab 01/28/21 1829  BNP 1,266.4*    DDimer No results for input(s): DDIMER in the last 168 hours.   Radiology/Studies:  DG Chest 2 View  Result Date: 01/28/2021 CLINICAL DATA:  Shortness of breath and weakness EXAM: CHEST - 2 VIEW COMPARISON:  07/22/2019 FINDINGS: Cardiac shadow is mildly enlarged but stable. Aortic calcifications are again seen. Postsurgical changes are noted. The lungs are well aerated bilaterally. Mild vascular congestion is seen without significant interstitial edema. Mild blunting of the costophrenic angles is noted and stable. No bony abnormality is noted. IMPRESSION: Mild CHF without significant edema. Electronically Signed   By: Inez Catalina M.D.   On: 01/28/2021 19:17     Assessment and Plan:   Acute on chronic HF -NYHA class III, stage C/D -volume overload with good perfusion on exam -strict I/O, daily weight/UOP, replace electrolytes as needed -one dose of IV lasix 80mg  tonight. Further dose will need to be adjusted tmv. Of note, "Per nephrology, would prefer not using metolazone.  Simply increasing the dose of Lasix". -continue carvedilol and hydarlazine/imdur -acquire a formal TTE am  2. CAD s/p CABG +PCI -Reports  DOE, no angina,  tropoinins on admission were elevated and continue rising, EKG demonstrated diffuse TWI -Patient firmly told me that he will not go on dialysis. Given his CKD, any interventional procedure will be guarded -continue plavix -continue lipid management (combination of Repatha, intermittent Crestor, fibrate and Vascepa) -continue anti-anginal carvedilol, amlodipine and imdur -continue heparin gtt for 48hours -Npo p MN  Risk Assessment/Risk Scores:        New York Heart Association (NYHA) Functional Class NYHA Class III        For questions or updates, please contact [facility] Please consult [email or web address] for contact info under    Signed, Laurice Record, MD  01/28/2021 10:51 PM

## 2021-01-28 NOTE — ED Triage Notes (Signed)
Pt c/o SOB and weakness. Normally ambulates without difficulty, starting yesterday pt couldn't go up steps without becoming extremely SOB. Worse when laying down. Pt endorses productive cough. Denies CP.

## 2021-01-28 NOTE — Telephone Encounter (Signed)
Pt notified of Dr Allison Quarry comments, he states that he will have someone take him to the ER ASAP

## 2021-01-28 NOTE — H&P (Signed)
History and Physical    Lance Collier AYT:[id number] DOB: Apr 06, 1946 DOA: 01/28/2021  PCP: Reynold Bowen, MD Patient coming from: Home  Chief Complaint: Shortness of breath  HPI: Lance Collier is a 75 y.o. male with medical history significant of CAD status post CABG and PCI, chronic diastolic CHF, CKD stage IV, insulin-dependent type 2 diabetes, hypertension, hyperlipidemia, PAD, carotid artery disease status post left CEA presents to the ED complaining of worsening dyspnea and orthopnea.  Not febrile or hypoxic.  Labs showing WBC 6.6, hemoglobin 9.1 (no significant change compared to last set of labs in the chart from February 2021), platelet count 214.  Sodium 133, potassium 3.6, chloride 97, bicarb 25, BUN 53, creatinine 3.3 (stable compared to labs done in March 2021), glucose 186.  BNP elevated at 1266.  High-sensitivity troponin elevated (760 > 870).  EKG showing diffuse ST depressions and T wave inversions, more pronounced compared to prior tracing from October 2021.  COVID test pending.  Chest x-ray showing mild vascular congestion without significant interstitial edema. Cardiology consulted and patient started on IV heparin.  Patient states his breathing has been worse for the past 2 days.  Also endorsing orthopnea and bilateral lower extremity edema.  Denies chest pain.  He takes Lasix 160 mg twice daily and has not missed any doses.  States he ate a "salty ham" yesterday and had a [place] sandwich today.  He drinks 32 ounces of fluid every day.  Patient states his cardiologist and nephrologist spoke to him about dialysis and he does not want it under any circumstance.  States he told his doctors that he would rather "die" than being started on dialysis.  Review of Systems:  All systems reviewed and apart from history of presenting illness, are negative.  Past Medical History:  Diagnosis Date   Anemia    Aortic sclerosis  - without Stenosis  March 2014   Echo: Aortic sclerosis  without stenosis. EF 55-60% with normal WM; mild concentric hypertrophy with normal relaxation.   Arthritis    CAD (coronary artery disease), autologous vein bypass graft 09/2014   10/03/14: Cath for Class III-IV Angina -- 80% ostial-proximal SVG-RPDA (minimal flow down native PDA from native RCA with 50% proximal and distal, patent LIMA-LAD, SVG-OM with retrograde filling of OM 2. --> 10/12/14: Staged PCI of SVG-RPDA - Xience DES  3.0 mm x 38 mm; (3.4  - 3.3 mm)   CAD S/P percutaneous coronary angioplasty 06/12/2011; 09/2104   a) 2012: Complex PCI mRCA ([place]) --  2 overlapping Promus element DES stents.; 09/2014:  ost-prox SVG-rPDA - Xience DES 3.0 mm x 38 mm; (3.4  - 3.3 mm)   CHF (congestive heart failure) (HCC)    Childhood asthma    CKD (chronic kidney disease), stage III ([place])    "mild; related to diabetes" (3/55/7322)   Complication of anesthesia    "I've had name recall recognition problems since my last anesthesia"   DM (diabetes mellitus) type II uncontrolled with eye manifestation ([place]) 2013   Diabetic retinopathy with retinal hemorrhages since;, recurrent in August 2014 treated with Avastin shots   Dyspnea    mainly with exertion   GERD (gastroesophageal reflux disease)    Heart murmur    questionable   High cholesterol    Hypertension    Migraine ~ 2004   "once"   PAD (peripheral artery disease) (HCC)    with claudication   PONV (postoperative nausea and vomiting)    s/p tonsillectomy  S/P CABG x 3 07/01/2013   a. 07/01/2013 (Cath for [place] Unstable Angina) --> [name]: For LM disease; LIMA-LAD, SVG-RCA, SCG-Cx; b. 09/2014 Cath/Staged PCI: patent LIMA->LAD, VG->OM1/RI, VG->RCA 80% (3.0x38 Xience Alpine DES on 10/12/2014).   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  Likely thromboembolic event with PDA occlusion. 06/12/2011   Post Complex PCI, pt states no mi   Wears glasses    reading    Past Surgical History:   Procedure Laterality Date   ABDOMINAL AORTOGRAM W/LOWER EXTREMITY N/A 02/05/2018   Procedure: ABDOMINAL AORTOGRAM W/LOWER EXTREMITY;  Surgeon: Elam Dutch, MD;  Location: [place] [facility];  Service: Cardiovascular;  Laterality: N/A;   COLONOSCOPY     CORONARY ARTERY BYPASS GRAFT N/A 07/01/2013   Procedure: CORONARY ARTERY BYPASS GRAFTING (CABG);  Surgeon: Grace Isaac, MD;  Location: [place];  Service: Open Heart Surgery;  Laterality: N/A;  Times 3 using left internal mammary artery and endoscopically harvested bilateral saphenous vein   ENDARTERECTOMY Left 05/21/2016   Procedure: LEFT CAROTID ENDARTERECTOMY;  Surgeon: Elam Dutch, MD;  Location: [place];  Service: Vascular;  Laterality: Left;   ENDARTERECTOMY FEMORAL Left 02/23/2018   Procedure: LEFT COMMON FEMORAL ENDARTERECTOMY;  Surgeon: Elam Dutch, MD;  Location: [facility] OR;  Service: Vascular;  Laterality: Left;   EYE SURGERY Bilateral    ioc for cataracts   INSERTION OF ILIAC STENT Bilateral 02/23/2018   Procedure: INSERTION OF BILATERAL COMMON ILIAC STENTS WITH VIABAHN STENTS;  Surgeon: Elam Dutch, MD;  Location: [place];  Service: Vascular;  Laterality: Bilateral;   INTRAOPERATIVE TRANSESOPHAGEAL ECHOCARDIOGRAM N/A 07/01/2013   Procedure: INTRAOPERATIVE TRANSESOPHAGEAL ECHOCARDIOGRAM;  Surgeon: Grace Isaac, MD;  Location: [place];  Service: Open Heart Surgery;  Laterality: N/A;   LEFT AND RIGHT HEART CATHETERIZATION WITH CORONARY/GRAFT ANGIOGRAM N/A 10/03/2014   Procedure: LEFT AND RIGHT HEART CATHETERIZATION WITH Beatrix Fetters;  Surgeon: Leonie Man, MD;  Location: [facility] CATH LAB;  Service: Cardiovascular; severe prox RCA 80% (dRCA w/ competetive flow & 60% hazy ISR in Native RCA), patent LIMA-LAD  & SVG-OM1-RI - retrograde fills OM2, known native LM & mod OM2.  Severe Systemic HTN - LVEDP/PCWP 22 & 28 mmHg, Normal CO/CI   LEFT HEART CATHETERIZATION WITH CORONARY ANGIOGRAM N/A 06/11/2011   Procedure: LEFT  HEART CATHETERIZATION WITH CORONARY ANGIOGRAM;  Surgeon: Fulton Reek, MD;  Location: [place] CATH LAB;;  Dense LM & prox LAD, prox RCA calcification. Large bifurcating OM1 & small OM2 normal. Minor LAD & D1 dz, tortuous prox-mid RCA w/ 60% &  subtotal occluded dRCA w/ normal PDA & PLV    LEFT HEART CATHETERIZATION WITH CORONARY ANGIOGRAM N/A 06/28/2013   Procedure: LEFT HEART CATHETERIZATION WITH CORONARY ANGIOGRAM;  Surgeon: Leonie Man, MD;  Location: [facility] CATH LAB;  Service: Cardiovascular;  ostial LM disese, RCA ~40-50%ISR   NM MYOVIEW LTD  04/29/2018   LEXISCAN: Non-gated study with small fixed moderate intensity defect in the apical inferior wall suspicious for either small area of infarction versus attenuation (no change from previous study).  LOW RISK.    PATCH ANGIOPLASTY Left 05/21/2016   Procedure: PATCH ANGIOPLASTY USING HEMASHIELD PLATINUM FINESSE 0.3in x 3 in;  Surgeon: Elam Dutch, MD;  Location: [place];  Service: Vascular;  Laterality: Left;   PATCH ANGIOPLASTY Left 02/23/2018   Procedure: PATCH ANGIOPLASTY LEFT COMMON FEMORAL ARTERY;  Surgeon: Elam Dutch, MD;  Location: [place];  Service: Vascular;  Laterality: Left;   PERCUTANEOUS CORONARY STENT INTERVENTION (PCI-S)  06/11/2011   Procedure: PERCUTANEOUS CORONARY STENT INTERVENTION (PCI-S);  Surgeon: Fulton Reek, MD;  Location: [facility] CATH LAB;  Service: Cardiovascular;;2 overlapping Promus DES 2.31mm at 12 mm x2; postdilated to 3 mm   PERCUTANEOUS CORONARY STENT INTERVENTION (PCI-S) N/A 10/12/2014   Procedure: PERCUTANEOUS CORONARY STENT INTERVENTION (PCI-S);  Surgeon: Leonie Man, MD;  Location: [facility] CATH LAB;  Ostial-Prox SVG-RCA Xience Alpine DES 3.0 mm x 38 mm; (3.4  - 3.3 mm)   RETINAL LASER PROCEDURE Bilateral    "more than once"    TONSILLECTOMY  as child   TRANSTHORACIC ECHOCARDIOGRAM  03/2017    EF 55-60%.  Normal thickness.  No R WMA..  GR 2 DD.  Mild-mod calcified aortic valve.  Mild RV systolic dysfunction.   Moderate RV dilation.  Moderately increased PA pressures.;;; 07/2019: EF 65%.  No WMA.  Unable to assess diastolic marginal RV PA pressures.  Mild to moderate LA dilation with moderate mild aortic valve sclerosis but no stenosis.     reports that he quit smoking about 24 years ago. His smoking use included cigarettes. He has a 54.00 pack-year smoking history. He has never used smokeless tobacco. He reports current alcohol use. He reports that he does not use drugs.  Allergies  Allergen Reactions   Atorvastatin Other (See Comments)    Leg pain   Ibuprofen Hives   Pravastatin Other (See Comments)    Leg pain   Simvastatin Other (See Comments)    Leg pain    Family History  Problem Relation Age of Onset   Lung cancer Sister    Breast cancer Sister    Breast cancer Mother    Hyperlipidemia Father    Hypertension Father    Throat cancer Father    Colon polyps Father    Liver cancer Maternal Grandmother    Stomach cancer Neg Hx    Rectal cancer Neg Hx    Colon cancer Neg Hx    Esophageal cancer Neg Hx     Prior to Admission medications   Medication Sig Start Date End Date Taking? Authorizing Provider  acetaminophen (TYLENOL) 325 MG tablet Take 650 mg by mouth daily as needed for mild pain or headache.     [provider]  amLODipine (NORVASC) 10 MG tablet Take 1 tablet by mouth daily. 02/26/19   [provider]  carvedilol (COREG) 12.5 MG tablet TAKE 1 TABLET BY MOUTH TWICE DAILY. 08/21/20   Leonie Man, MD  clopidogrel (PLAVIX) 75 MG tablet TAKE 1 TABLET DAILY WITH BREAKFAST. 06/06/20   Leonie Man, MD  Continuous Blood Gluc Sensor (FREESTYLE LIBRE 14 DAY SENSOR) MISC MONITOR BS CONTINUOUSLY  CHANGE Q 14 DAYS. 11/28/18   [provider]  docusate sodium (COLACE) 100 MG capsule Take 100 mg by mouth daily as needed for mild constipation.     [provider]  fenofibrate 160 MG tablet TAKE 1 TABLET ONCE DAILY. 12/10/20   Leonie Man, MD   furosemide (LASIX) 80 MG tablet Take 160 mg by mouth 2 (two) times daily. MAY TAKE AN EXTRA 80 MG IF NEEDED FOR INCREASE SWELLING OR SHORTNESS OF BREATH LYING DOWN.    [provider]  HUMALOG KWIKPEN 200 UNIT/ML SOPN Inject 20-28 Units into the skin See admin instructions. Use 20 units (base) per meal increase units given by 1 unit for every 15 increment over 150 (I.E. Blood glucose reading 165=21 units, 180=22 units,  etc. ) 11/07/17   [provider]  hydrALAZINE (APRESOLINE) 100 MG tablet TAKE ONE TABLET BY MOUTH THREE TIMES DAILY 11/12/20   Leonie Man, MD  isosorbide mononitrate (IMDUR) 30 MG 24 hr tablet TAKE 1 TABLET ONCE DAILY IN THE EVENING. 10/24/20   Leonie Man, MD  isosorbide mononitrate (IMDUR) 60 MG 24 hr tablet TAKE 1 TABLET ONCE DAILY. 07/26/20   Leonie Man, MD  ketoconazole (NIZORAL) 2 % cream As directed 09/06/19   [provider]  pantoprazole (PROTONIX) 40 MG tablet TAKE 1 TABLET ONCE DAILY. 09/19/20   Leonie Man, MD  REPATHA SURECLICK 614 MG/ML SOAJ INJECT CONTENTS OF 1 PEN SUBCUTANEOUSLY EVERY 14 DAYS. 07/03/20   Leonie Man, MD  rosuvastatin (CRESTOR) 10 MG tablet TAKE 1 TABLET ON TUESDAY, THURSDAY, SATURDAY AND SUNDAY. 09/19/20   Leonie Man, MD  TOUJEO MAX SOLOSTAR 300 UNIT/ML SOPN Inject 66 Units into the skin 2 (two) times daily.  11/07/17   [provider]  VASCEPA 1 G CAPS Take 2 g by mouth 2 (two) times daily.  01/31/15   [provider]    Physical Exam: Vitals:   01/28/21 2200 01/28/21 2215 01/28/21 2230 01/28/21 2310  BP: (!) 161/66 (!) 152/60 (!) 154/60 (!) 164/55  Pulse: (!) 57 (!) 55 (!) 58 62  Resp: 18 13 15 18   Temp:    97.7 F (36.5 C)  TempSrc:    Oral  SpO2: 98% 98% 95% 96%  Weight:      Height:        Physical Exam Constitutional:      General: He is not in acute distress. HENT:     Head: Normocephalic and atraumatic.  Eyes:     Extraocular Movements: Extraocular movements  intact.     Conjunctiva/sclera: Conjunctivae normal.  Neck:     Comments: JVD present Cardiovascular:     Rate and Rhythm: Normal rate and regular rhythm.     Pulses: Normal pulses.     Heart sounds: Murmur heard.  Pulmonary:     Effort: Pulmonary effort is normal. No respiratory distress.     Breath sounds: No wheezing or rales.  Abdominal:     General: Bowel sounds are normal. There is distension.     Palpations: Abdomen is soft.     Tenderness: There is no abdominal tenderness. There is no guarding or rebound.  Musculoskeletal:     Cervical back: Normal range of motion and neck supple.     Right lower leg: Edema present.     Left lower leg: Edema present.     Comments: +4 pitting edema of bilateral lower extremities  Skin:    General: Skin is warm and dry.  Neurological:     General: No focal deficit present.     Mental Status: He is alert and oriented to person, place, and time.     Labs on Admission: I have personally reviewed following labs and imaging studies  CBC: Recent Labs  Lab 01/28/21 1829  WBC 6.6  NEUTROABS 4.9  HGB 9.1*  HCT 28.6*  MCV 99.0  PLT 431   Basic Metabolic Panel: Recent Labs  Lab 01/28/21 1829  NA 133*  K 3.6  CL 97*  CO2 25  GLUCOSE 186*  BUN 53*  CREATININE 3.31*  CALCIUM 9.0   GFR: Estimated Creatinine Clearance: 20.5 mL/min (A) (by C-G formula based on SCr of 3.31 mg/dL (H)). Liver Function Tests: No results for input(s):  AST, ALT, ALKPHOS, BILITOT, PROT, ALBUMIN in the last 168 hours. No results for input(s): LIPASE, AMYLASE in the last 168 hours. No results for input(s): AMMONIA in the last 168 hours. Coagulation Profile: Recent Labs  Lab 01/28/21 1829  INR 1.0   Cardiac Enzymes: No results for input(s): CKTOTAL, CKMB, CKMBINDEX, TROPONINI in the last 168 hours. BNP (last 3 results) No results for input(s): PROBNP in the last 8760 hours. HbA1C: No results for input(s): HGBA1C in the last 72 hours. CBG: No results  for input(s): GLUCAP in the last 168 hours. Lipid Profile: No results for input(s): CHOL, HDL, LDLCALC, TRIG, CHOLHDL, LDLDIRECT in the last 72 hours. Thyroid Function Tests: No results for input(s): TSH, T4TOTAL, FREET4, T3FREE, THYROIDAB in the last 72 hours. Anemia Panel: No results for input(s): VITAMINB12, FOLATE, FERRITIN, TIBC, IRON, RETICCTPCT in the last 72 hours. Urine analysis:    Component Value Date/Time   COLORURINE YELLOW 02/16/2018 0957   APPEARANCEUR CLEAR 02/16/2018 0957   LABSPEC 1.010 02/16/2018 0957   PHURINE 6.0 02/16/2018 0957   GLUCOSEU NEGATIVE 02/16/2018 0957   HGBUR NEGATIVE 02/16/2018 0957   BILIRUBINUR NEGATIVE 02/16/2018 0957   KETONESUR NEGATIVE 02/16/2018 0957   PROTEINUR 30 (A) 02/16/2018 0957   UROBILINOGEN 0.2 06/29/2013 1720   NITRITE NEGATIVE 02/16/2018 0957   LEUKOCYTESUR NEGATIVE 02/16/2018 0957    Radiological Exams on Admission: DG Chest 2 View  Result Date: 01/28/2021 CLINICAL DATA:  Shortness of breath and weakness EXAM: CHEST - 2 VIEW COMPARISON:  07/22/2019 FINDINGS: Cardiac shadow is mildly enlarged but stable. Aortic calcifications are again seen. Postsurgical changes are noted. The lungs are well aerated bilaterally. Mild vascular congestion is seen without significant interstitial edema. Mild blunting of the costophrenic angles is noted and stable. No bony abnormality is noted. IMPRESSION: Mild CHF without significant edema. Electronically Signed   By: Inez Catalina M.D.   On: 01/28/2021 19:17    EKG: Independently reviewed.  Sinus bradycardia with first-degree AV block, diffuse ST depressions and T wave inversions.  ST changes more pronounced compared to prior tracing from October 2021.  Assessment/Plan Principal Problem:   CHF exacerbation (HCC) Active Problems:   CAD (coronary artery disease)   Diabetes mellitus ([place])   Essential hypertension   Chronic kidney disease (CKD), stage IV (severe) (HCC)   Acute on chronic diastolic  CHF Appears volume overloaded on exam.  BNP elevated at 1266.  Chest x-ray showing mild vascular congestion without significant interstitial edema.  Not hypoxic at rest.  EF 65 to 16% and diastolic parameters indeterminate per echo done February 2021. -Cardiology recommending IV Lasix 80 mg x 1 tonight and further dose adjustment tomorrow.  Resume carvedilol, hydralazine, and Imdur after pharmacy med rec is done.  Repeat echocardiogram.  Strict intake and output, daily weights.  CAD status post CABG and PCI High-sensitivity troponin elevated (760 > 870).  EKG showing diffuse ST depressions and T wave inversions, more pronounced compared to prior tracing from October 2021.  Patient is endorsing dyspnea but no chest pain.   -Since patient has stated that he will not go on dialysis, cardiology not planning on any interventional procedure.  Recommendation is to continue IV heparin for 48 hours.  Resume home medications including Plavix, Repatha, Crestor, fenofibrate, Vascepa, carvedilol, amlodipine, and Imdur after pharmacy med rec is done.  Keep n.p.o. after midnight.  CKD stage IV Creatinine 3.3, stable compared to labs done in March 2021. -Continue to monitor renal function  Insulin-dependent type 2 diabetes -Check  A1c.  Sliding scale insulin sensitive every 4 hours as patient is currently NPO.  Pharmacy med rec pending.  Hypertension -Resume carvedilol, amlodipine, hydralazine, and Imdur after pharmacy med rec is done.  Hyperlipidemia -Resume Repatha, Crestor, fenofibrate, and Vascepa after pharmacy med rec is done.  DVT prophylaxis: Heparin Code Status: Patient wishes to be DNR. Family Communication: No family available at this time. Disposition Plan: Status is: Inpatient  Remains inpatient appropriate because:Inpatient level of care appropriate due to severity of illness  Dispo: The patient is from: Home              Anticipated d/c is to: Home              Patient currently is not  medically stable to d/c.   Difficult to place patient No  Level of care: Level of care: Progressive The medical decision making on this patient was of high complexity and the patient is at high risk for clinical deterioration, therefore this is a level 3 visit.  Shela Leff MD [facility]  If 7PM-7AM, please contact night-coverage [email or web address]  01/29/2021, 12:40 AM

## 2021-01-28 NOTE — Telephone Encounter (Signed)
Pt c/o Shortness Of Breath: STAT if SOB developed within the last 24 hours or pt is noticeably SOB on the phone  1. Are you currently SOB (can you hear that pt is SOB on the phone)? no  2. How long have you been experiencing SOB? Last 3 days  3. Are you SOB when sitting or when up moving around? Moving around  4. Are you currently experiencing any other symptoms? Being congested is effecting his breathing.

## 2021-01-28 NOTE — Telephone Encounter (Signed)
Returned call to pt he states that he is "SOB all the time and did not sleep at all last night" he states that he cannot lie down because he is too SOB, he is fatigued, DOE, he has weakness. He "cannot do anything". Pt states "I think me stents and grafts have collapsed". Informed pt that on his ECHO 07-2019 his EF was 65-70%.  Informed him that is was great and the stents and grafts were fine with this reading. Verbalized understanding. Pt is taking Lasix 160mg  BID. With Dr Allison Quarry note "It is very difficult because with diuresis, his kidney function gets worse". Informed pt to go to the ER and be evaluated. verbalized understanding. He states that he was in the ER last time for 8 hours and he needed transfusion and IV(pt was yelling at me) pt states that he did feel better after going. He will go to the [facility] ER soon. Informed via text that pt was going to the ER(no answer via telephone).

## 2021-01-28 NOTE — Telephone Encounter (Signed)
Left Message to [facility]

## 2021-01-29 ENCOUNTER — Encounter (HOSPITAL_COMMUNITY): Payer: Self-pay | Admitting: Internal Medicine

## 2021-01-29 ENCOUNTER — Inpatient Hospital Stay (HOSPITAL_COMMUNITY): Payer: Medicare Other

## 2021-01-29 DIAGNOSIS — N184 Chronic kidney disease, stage 4 (severe): Secondary | ICD-10-CM | POA: Diagnosis not present

## 2021-01-29 DIAGNOSIS — I509 Heart failure, unspecified: Secondary | ICD-10-CM

## 2021-01-29 DIAGNOSIS — I5031 Acute diastolic (congestive) heart failure: Secondary | ICD-10-CM

## 2021-01-29 DIAGNOSIS — I25119 Atherosclerotic heart disease of native coronary artery with unspecified angina pectoris: Secondary | ICD-10-CM

## 2021-01-29 DIAGNOSIS — I5033 Acute on chronic diastolic (congestive) heart failure: Secondary | ICD-10-CM

## 2021-01-29 LAB — ECHOCARDIOGRAM COMPLETE
AR max vel: 1.63 cm2
AV Area VTI: 1.86 cm2
AV Area mean vel: 1.68 cm2
AV Mean grad: 6.3 mmHg
AV Peak grad: 12.3 mmHg
Ao pk vel: 1.75 m/s
Area-P 1/2: 7.16 cm2
Height: 68 in
MV M vel: 5.52 m/s
MV Peak grad: 121.9 mmHg
Radius: 0.4 cm
S' Lateral: 4.2 cm
Weight: 3195.2 oz

## 2021-01-29 LAB — BASIC METABOLIC PANEL
Anion gap: 10 (ref 5–15)
Anion gap: 11 (ref 5–15)
BUN: 53 mg/dL — ABNORMAL HIGH (ref 8–23)
BUN: 49 mg/dL — ABNORMAL HIGH (ref 8–23)
CO2: 27 mmol/L (ref 22–32)
CO2: 25 mmol/L (ref 22–32)
Calcium: 9.2 mg/dL (ref 8.9–10.3)
Calcium: 9.1 mg/dL (ref 8.9–10.3)
Chloride: 99 mmol/L (ref 98–111)
Chloride: 100 mmol/L (ref 98–111)
Creatinine, Ser: 3.12 mg/dL — ABNORMAL HIGH (ref 0.61–1.24)
Creatinine, Ser: 3.04 mg/dL — ABNORMAL HIGH (ref 0.61–1.24)
GFR, Estimated: 20 mL/min — ABNORMAL LOW (ref >60)
GFR, Estimated: 21 mL/min — ABNORMAL LOW (ref >60)
Glucose, Bld: 85 mg/dL (ref 70–99)
Glucose, Bld: 152 mg/dL — ABNORMAL HIGH (ref 70–99)
Potassium: 3.2 mmol/L — ABNORMAL LOW (ref 3.5–5.1)
Potassium: 3.6 mmol/L (ref 3.5–5.1)
Sodium: 136 mmol/L (ref 135–145)
Sodium: 136 mmol/L (ref 135–145)

## 2021-01-29 LAB — HEMOGLOBIN A1C
Hgb A1c MFr Bld: 6.8 % — ABNORMAL HIGH (ref 4.8–5.6)
Mean Plasma Glucose: 148.46 mg/dL

## 2021-01-29 LAB — CBG MONITORING, ED
Glucose-Capillary: 107 mg/dL — ABNORMAL HIGH (ref 70–99)
Glucose-Capillary: 179 mg/dL — ABNORMAL HIGH (ref 70–99)

## 2021-01-29 LAB — HEPARIN LEVEL (UNFRACTIONATED)
Heparin Unfractionated: 0.21 IU/mL — ABNORMAL LOW (ref 0.30–0.70)
Heparin Unfractionated: 0.33 IU/mL (ref 0.30–0.70)

## 2021-01-29 LAB — RESP PANEL BY RT-PCR (FLU A&B, COVID) ARPGX2
Influenza A by PCR: NEGATIVE
Influenza B by PCR: NEGATIVE
SARS Coronavirus 2 by RT PCR: NEGATIVE

## 2021-01-29 LAB — GLUCOSE, CAPILLARY
Glucose-Capillary: 70 mg/dL (ref 70–99)
Glucose-Capillary: 107 mg/dL — ABNORMAL HIGH (ref 70–99)
Glucose-Capillary: 162 mg/dL — ABNORMAL HIGH (ref 70–99)
Glucose-Capillary: 202 mg/dL — ABNORMAL HIGH (ref 70–99)

## 2021-01-29 MED ORDER — FENOFIBRATE 160 MG PO TABS
160.0000 mg | ORAL_TABLET | Freq: Every day | ORAL | Status: DC
  Administered 2021-01-29 – 2021-01-30 (×2): 160 mg via ORAL
  Filled 2021-01-29 (×2): qty 1

## 2021-01-29 MED ORDER — POTASSIUM CHLORIDE CRYS ER 20 MEQ PO TBCR
40.0000 meq | EXTENDED_RELEASE_TABLET | Freq: Once | ORAL | Status: AC
  Administered 2021-01-29: 40 meq via ORAL
  Filled 2021-01-29: qty 2

## 2021-01-29 MED ORDER — CARVEDILOL 3.125 MG PO TABS
3.1250 mg | ORAL_TABLET | Freq: Two times a day (BID) | ORAL | Status: DC
  Administered 2021-01-29 – 2021-01-30 (×2): 3.125 mg via ORAL
  Filled 2021-01-29 (×3): qty 1

## 2021-01-29 MED ORDER — EVOLOCUMAB 140 MG/ML ~~LOC~~ SOAJ: 140.0000 mg | SUBCUTANEOUS | Status: DC

## 2021-01-29 MED ORDER — FUROSEMIDE 10 MG/ML IJ SOLN
80.0000 mg | Freq: Once | INTRAMUSCULAR | Status: AC
  Administered 2021-01-29: 80 mg via INTRAVENOUS
  Filled 2021-01-29: qty 8

## 2021-01-29 MED ORDER — AMLODIPINE BESYLATE 10 MG PO TABS
10.0000 mg | ORAL_TABLET | Freq: Every day | ORAL | Status: DC
  Administered 2021-01-29: 10 mg via ORAL
  Filled 2021-01-29: qty 1

## 2021-01-29 MED ORDER — ICOSAPENT ETHYL 1 G PO CAPS
2.0000 g | ORAL_CAPSULE | Freq: Two times a day (BID) | ORAL | Status: DC
  Administered 2021-01-29 – 2021-01-30 (×3): 2 g via ORAL
  Filled 2021-01-29 (×4): qty 2

## 2021-01-29 MED ORDER — EZETIMIBE 10 MG PO TABS
10.0000 mg | ORAL_TABLET | Freq: Every day | ORAL | Status: DC
  Administered 2021-01-29 – 2021-01-30 (×2): 10 mg via ORAL
  Filled 2021-01-29 (×3): qty 1

## 2021-01-29 MED ORDER — HYDRALAZINE HCL 50 MG PO TABS
100.0000 mg | ORAL_TABLET | Freq: Three times a day (TID) | ORAL | Status: DC
  Administered 2021-01-29 – 2021-01-30 (×4): 100 mg via ORAL
  Filled 2021-01-29 (×4): qty 2

## 2021-01-29 MED ORDER — INSULIN ASPART 100 UNIT/ML IJ SOLN
0.0000 [IU] | INTRAMUSCULAR | Status: DC
  Administered 2021-01-29 (×3): 2 [IU] via SUBCUTANEOUS
  Administered 2021-01-30: 3 [IU] via SUBCUTANEOUS

## 2021-01-29 MED ORDER — DOCUSATE SODIUM 100 MG PO CAPS: 100.0000 mg | ORAL_CAPSULE | Freq: Every day | ORAL | Status: DC | PRN

## 2021-01-29 MED ORDER — CARVEDILOL 12.5 MG PO TABS
12.5000 mg | ORAL_TABLET | Freq: Two times a day (BID) | ORAL | Status: DC
  Filled 2021-01-29: qty 1

## 2021-01-29 MED ORDER — ISOSORBIDE MONONITRATE ER 30 MG PO TB24
30.0000 mg | ORAL_TABLET | Freq: Every day | ORAL | Status: DC
  Administered 2021-01-29: 30 mg via ORAL
  Filled 2021-01-29: qty 1

## 2021-01-29 MED ORDER — PANTOPRAZOLE SODIUM 40 MG PO TBEC
40.0000 mg | DELAYED_RELEASE_TABLET | Freq: Every day | ORAL | Status: DC
  Administered 2021-01-29 – 2021-01-30 (×2): 40 mg via ORAL
  Filled 2021-01-29 (×2): qty 1

## 2021-01-29 MED ORDER — POTASSIUM CHLORIDE 20 MEQ PO PACK
40.0000 meq | PACK | Freq: Once | ORAL | Status: DC
  Filled 2021-01-29: qty 2

## 2021-01-29 MED ORDER — ROSUVASTATIN CALCIUM 5 MG PO TABS: 10.0000 mg | ORAL_TABLET | ORAL | Status: DC

## 2021-01-29 MED ORDER — INSULIN ASPART 100 UNIT/ML IJ SOLN: 0.0000 [IU] | Freq: Three times a day (TID) | INTRAMUSCULAR | Status: DC

## 2021-01-29 MED ORDER — ISOSORBIDE MONONITRATE ER 60 MG PO TB24
60.0000 mg | ORAL_TABLET | Freq: Every day | ORAL | Status: DC
  Administered 2021-01-29 – 2021-01-30 (×2): 60 mg via ORAL
  Filled 2021-01-29 (×2): qty 1

## 2021-01-29 MED ORDER — CLOPIDOGREL BISULFATE 75 MG PO TABS
75.0000 mg | ORAL_TABLET | Freq: Every day | ORAL | Status: DC
  Administered 2021-01-29 – 2021-01-30 (×2): 75 mg via ORAL
  Filled 2021-01-29: qty 1

## 2021-01-29 NOTE — ED Notes (Signed)
Still no word from provider in regards to Heparin order. Paged provider again.

## 2021-01-29 NOTE — Progress Notes (Addendum)
Progress Note  Patient Name: Lance Collier Date of Encounter: 01/29/2021  [facility] Cardiologist: Glenetta Hew, MD   Subjective   Breathing better, DOE, orthopnea and PND were not noticed till Sat night, ok on Friday, no CP RLE hurts more than LLE, but L is more swollen Reiterates that he will not do HD  Wants to go home  Inpatient Medications    Scheduled Meds:  amLODipine  10 mg Oral QHS   carvedilol  12.5 mg Oral BID WC   clopidogrel  75 mg Oral Daily   ezetimibe  10 mg Oral Daily   fenofibrate  160 mg Oral Daily   hydrALAZINE  100 mg Oral TID   icosapent Ethyl  2 g Oral BID   insulin aspart  0-9 Units Subcutaneous Q4H   isosorbide mononitrate  30 mg Oral QHS   isosorbide mononitrate  60 mg Oral Daily   pantoprazole  40 mg Oral Daily   Continuous Infusions:  heparin 1,300 Units/hr (01/29/21 0708)   PRN Meds:    Vital Signs    Vitals:   01/29/21 0400 01/29/21 0500 01/29/21 0549 01/29/21 0827  BP: (!) 128/48 125/61 (!) 144/50 (!) 164/75  Pulse:  60 60 60  Resp:  17 18   Temp:  98 F (36.7 C) 98 F (36.7 C) 97.6 F (36.4 C)  TempSrc:  Oral Oral Axillary  SpO2: 94% 93% 98% 97%  Weight:   90.6 kg   Height:   5\' 8"  (1.727 m)     Intake/Output Summary (Last 24 hours) at 01/29/2021 0839 Last data filed at 01/29/2021 0440 Gross per 24 hour  Intake --  Output 1050 ml  Net -1050 ml   Last 3 Weights 01/29/2021 01/28/2021 07/25/2020  Weight (lbs) 199 lb 11.2 oz 195 lb 197 lb  Weight (kg) 90.583 kg 88.451 kg 89.359 kg      Telemetry    Sinus brady 50s - Personally Reviewed  ECG    None today - Personally Reviewed  Physical Exam   GEN: No acute distress.   Neck: JVD 9-10 cm Cardiac: RRR, 2/6 murmur, no rubs, or gallops.  Respiratory: few rales bases bilaterally. GI: Soft, nontender, non-distended  MS: L>R LE edema; No deformity. Neuro:  Nonfocal  Psych: Normal affect   Labs    High Sensitivity Troponin:   Recent Labs  Lab 01/28/21 1829  01/28/21 2110  TROPONINIHS 760* 870*      Chemistry Recent Labs  Lab 01/28/21 1829 01/29/21 0556  NA 133* 136  K 3.6 3.2*  CL 97* 99  CO2 25 27  GLUCOSE 186* 85  BUN 53* 53*  CREATININE 3.31* 3.12*  CALCIUM 9.0 9.2  GFRNONAA 19* 20*  ANIONGAP 11 10     Hematology Recent Labs  Lab 01/28/21 1829  WBC 6.6  RBC 2.89*  HGB 9.1*  HCT 28.6*  MCV 99.0  MCH 31.5  MCHC 31.8  RDW 13.1  PLT 214    BNP Recent Labs  Lab 01/28/21 1829  BNP 1,266.4*     DDimer No results for input(s): DDIMER in the last 168 hours.   Radiology    DG Chest 2 View  Result Date: 01/28/2021 CLINICAL DATA:  Shortness of breath and weakness EXAM: CHEST - 2 VIEW COMPARISON:  07/22/2019 FINDINGS: Cardiac shadow is mildly enlarged but stable. Aortic calcifications are again seen. Postsurgical changes are noted. The lungs are well aerated bilaterally. Mild vascular congestion is seen without significant interstitial edema. Mild  blunting of the costophrenic angles is noted and stable. No bony abnormality is noted. IMPRESSION: Mild CHF without significant edema. Electronically Signed   By: Inez Catalina M.D.   On: 01/28/2021 19:17    Cardiac Studies   ECHO: ordered  Patient Profile     75 y.o. male with a hx of CAD (PCI followed by CABG then PCI to SVG-LCx and RCA), HFpEF, CKD-4, DM-2 with PN and retinopathy, severe PAD with limiting claudication, was admitted 08/08 with CHF  Assessment & Plan    Actue on chronic diastolic CHF: - pt got 1 dose IV Lasix 80 mg last pm, says urinated a lot - I/O net - 1L - discuss w/MD if we should give 1 more dose of Lasix and possible d/c this afternoon if does well, BMET later this week - pt does not weigh daily, but says base weight about 195 lbs, was 199.7 today  2. CKD IV - Cr 3.31 last pm, his baseline - 3.12 today - follow daily - he has stated he will not go on HD  3. Hx CAD w/ elevated troponin: -  trop elevated, but pt does not wish it to be  rechecked - no reported ischemic sx  - will restart his home rx of amlodipine, Imdur - decrease dose of Coreg to 3.125 mg bid since HR 50s, may be able to increase to 6.25 mg bid  4. HTN - restart above meds plus hydralazine  5. Anemia - had after his PV cath in 2019, req transfusions - Hgb normally about 11 - MCV ok - suspect anemia of chronic dz - he is not aware of any blood loss - does not wish to get iron checked right now   For questions or updates, please contact [place] Please consult [email or web address] for contact info under        Signed, Rosaria Ferries, PA-C  01/29/2021, 8:39 AM     Attending Note:   The patient was seen and examined.  Agree with assessment and plan as noted above.  Changes made to the above note as needed.  Patient seen and independently examined with Rosaria Ferries, PA .   We discussed all aspects of the encounter. I agree with the assessment and plan as stated above.     Acute on chronic diastolic CHF:   EF is low normal by echo this am Still has some volume overload Eats a very salty diet - country ham, hot dogs regularly  Advised against eating so much salt.  Lasix 80 mg IV today  Reevaluate tomorrow am   CKD- stage IV .  Creatinine is 3.1   3.  Hx of CAD:  troponin is flat.   Likely due to his underlying cAD and volume overload I would not want to do a cath unless absolutely needed.   He has a creatinine of 3.1 and says he refuses to ever consider dialysis     I have spent a total of 40 minutes with patient reviewing hospital  notes , telemetry, EKGs, labs and examining patient as well as establishing an assessment and plan that was discussed with the patient.  > 50% of time was spent in direct patient care.    Thayer Headings, Brooke Bonito., MD, [facility] 01/29/2021, 10:24 AM [address],  [place] Pager [phone]

## 2021-01-29 NOTE — Progress Notes (Signed)
  Echocardiogram 2D Echocardiogram has been performed.  Darlina Sicilian M 01/29/2021, 10:11 AM

## 2021-01-29 NOTE — Progress Notes (Signed)
[place] for Heparin Indication: chest pain/ACS  Labs: Recent Labs    01/28/21 1829 01/28/21 2110 01/29/21 0556  HGB 9.1*  --   --   HCT 28.6*  --   --   PLT 214  --   --   LABPROT 13.2  --   --   INR 1.0  --   --   HEPARINUNFRC  --   --  0.21*  CREATININE 3.31*  --  3.12*  TROPONINIHS 760* 870*  --    Assessment: 85 yom with above history presenting with worsening shortness of breath. Pharmacy consulted for heparin dosing for ACS.  Patient not on anticoagulation prior to arrival. Hgb 9.1; plt 214 - baseline. PT/INR 13.2/1.  Goal of Therapy:  Heparin level 0.3-0.7 units/ml Monitor platelets by anticoagulation protocol: Yes   Plan:  Increase heparin to 1300 units/hr Check heparin level in 6-8 hours  Thanks for allowing pharmacy to be a part of this patient's care.  Excell Seltzer, PharmD Clinical Pharmacist

## 2021-01-29 NOTE — Progress Notes (Signed)
[place] for Heparin Indication: chest pain/ACS  Labs: Recent Labs    01/28/21 1829 01/28/21 2110 01/29/21 0556 01/29/21 1345  HGB 9.1*  --   --   --   HCT 28.6*  --   --   --   PLT 214  --   --   --   LABPROT 13.2  --   --   --   INR 1.0  --   --   --   HEPARINUNFRC  --   --  0.21* 0.33  CREATININE 3.31*  --  3.12* 3.04*  TROPONINIHS 760* 870*  --   --    Assessment: 31 yom with above history presenting with worsening shortness of breath. Pharmacy consulted for heparin dosing for ACS. Patient not on anticoagulation prior to arrival. Per notes he prefers not to have a cath unless necessary.  -heparin level at goal on 1300 units.hr   Goal of Therapy:  Heparin level 0.3-0.7 units/ml Monitor platelets by anticoagulation protocol: Yes   Plan:  -No heparin changes needed -Daily heparin level and CBC  Hildred Laser, PharmD Clinical Pharmacist **Pharmacist phone directory can now be found on [email or web address] ([email or web address]).  Listed under [place].

## 2021-01-29 NOTE — ED Notes (Signed)
Family updated as to patient's status.

## 2021-01-29 NOTE — Progress Notes (Addendum)
PROGRESS NOTE    Lance Collier  AST:[id number] DOB: 15-May-1946 DOA: 01/28/2021 PCP: Reynold Bowen, MD   Brief Narrative: This 75 years old male with PMH significant for CAD s/p CABG and PCI, chronic diastolic CHF, CKD stage IV, insulin-dependent type 2 diabetes, hypertension, hyperlipidemia, PAD, carotid artery disease s/p left CEA presented in the ED with complaints of worsening shortness of breath and orthopnea.  Patient is not hypoxic.  High-sensitivity troponin elevated 760> 870.  EKG shows diffuse ST depression and T wave inversions more pronounced compared to the prior EKG in October 2021. Cardiology was consulted and patient was started on IV heparin.  Patient has been noncompliant with diet recommendation. He reports that his cardiologist and nephrologist has advised him about dialysis but he does not want it under any circumstances. Patient is admitted for acute on chronic diastolic CHF.  Assessment & Plan:   Principal Problem:   CHF exacerbation ([place]) Active Problems:   CAD (coronary artery disease)   Diabetes mellitus ([place])   Essential hypertension   Chronic kidney disease (CKD), stage IV (severe) ([facility])  Acute on chronic diastolic CHF: Patient presented with significant shortness of breath and orthopnea. He appears volume noted on exam.  BNP 1266.  Chest x-ray mild vascular congestion without significant interstitial edema . He is not hypoxic at rest.  LVEF 65 to 98% and diastolic parameters indeterminate per echo done February 2021. Cardiology consulted recommended Lasix 80 mg IV once,  further dose adjustment in the morning.   Strict intake and output, daily weights.   CAD status post CABG and PCI. Patient is having elevated troponin (760 >870).  EKG showing diffuse ST depressions and T wave inversions, more pronounced as compared to prior EKG.  Patient reports shortness of breath but denies any chest pain. .   Since patient has refused hemodialysis, cardiology not  planning on any interventional procedure.  Recommendation is to continue IV heparin for 48 hours.  Resume home medications including Plavix, Repatha, Crestor, fenofibrate, Vascepa, carvedilol, amlodipine, and Imdur    CKD stage IV Creatinine 3.3, stable compared to labs done in March 2021. Continue to monitor renal function   Insulin-dependent type 2 diabetes Continue regular insulin sliding scale.  Hypertension Continue carvedilol, amlodipine, hydralazine, and Imdur   Hyperlipidemia Continue Repatha, Crestor, fenofibrate, and Vascepa    DVT prophylaxis: Heparin Code Status: DNR Family Communication: No family at bed side. Disposition Plan:    Status is: Inpatient  Remains inpatient appropriate because:Inpatient level of care appropriate due to severity of illness  Dispo: The patient is from: Home              Anticipated d/c is to: Home              Patient currently is not medically stable to d/c.   Difficult to place patient No  Consultants:  Cardiology  Procedures: Antimicrobials:   Anti-infectives (From admission, onward)    None        Subjective: Patient was seen and examined at bedside.  Overnight events noted.  Patient was sitting comfortably in the chair stated he wants to be discharged.  Explained to him in detail that he is fluid overloaded and we have to give him diuretics to get better  Objective: Vitals:   01/29/21 0827 01/29/21 0959 01/29/21 1136 01/29/21 1556  BP: (!) 164/75 (!) 154/60 (!) 115/97 (!) 123/46  Pulse: 60 (!) 57 (!) 52 (!) 51  Resp:      Temp: 97.6  F (36.4 C)  (!) 97.4 F (36.3 C) 97.7 F (36.5 C)  TempSrc: Axillary  Oral Oral  SpO2: 97%  94% 92%  Weight:      Height:        Intake/Output Summary (Last 24 hours) at 01/29/2021 1604 Last data filed at 01/29/2021 0800 Gross per 24 hour  Intake 240 ml  Output 1050 ml  Net -810 ml   Filed Weights   01/28/21 1818 01/29/21 0549  Weight: 88.5 kg 90.6 kg     Examination:  General exam: Appears calm and comfortable, not in any acute distress. Respiratory system: Clear to auscultation. Respiratory effort normal. Cardiovascular system: S1 & S2 heard, RRR. No JVD, murmurs, rubs, gallops or clicks. No pedal edema. Gastrointestinal system: Abdomen is nondistended, soft and nontender. No organomegaly or masses felt. Normal bowel sounds heard. Central nervous system: Alert and oriented. No focal neurological deficits. Extremities: 2+ pitting edema noted, no cyanosis, no clubbing. Skin: No rashes, lesions or ulcers Psychiatry: Judgement and insight appear normal. Mood & affect appropriate.     Data Reviewed: I have personally reviewed following labs and imaging studies  CBC: Recent Labs  Lab 01/28/21 1829  WBC 6.6  NEUTROABS 4.9  HGB 9.1*  HCT 28.6*  MCV 99.0  PLT 185   Basic Metabolic Panel: Recent Labs  Lab 01/28/21 1829 01/29/21 0556 01/29/21 1345  NA 133* 136 136  K 3.6 3.2* 3.6  CL 97* 99 100  CO2 25 27 25   GLUCOSE 186* 85 152*  BUN 53* 53* 49*  CREATININE 3.31* 3.12* 3.04*  CALCIUM 9.0 9.2 9.1   GFR: Estimated Creatinine Clearance: 22.6 mL/min (A) (by C-G formula based on SCr of 3.04 mg/dL (H)). Liver Function Tests: No results for input(s): AST, ALT, ALKPHOS, BILITOT, PROT, ALBUMIN in the last 168 hours. No results for input(s): LIPASE, AMYLASE in the last 168 hours. No results for input(s): AMMONIA in the last 168 hours. Coagulation Profile: Recent Labs  Lab 01/28/21 1829  INR 1.0   Cardiac Enzymes: No results for input(s): CKTOTAL, CKMB, CKMBINDEX, TROPONINI in the last 168 hours. BNP (last 3 results) No results for input(s): PROBNP in the last 8760 hours. HbA1C: Recent Labs    01/29/21 0033  HGBA1C 6.8*   CBG: Recent Labs  Lab 01/29/21 0057 01/29/21 0349 01/29/21 0743 01/29/21 1138 01/29/21 1554  GLUCAP 179* 107* 70 107* 162*   Lipid Profile: No results for input(s): CHOL, HDL, LDLCALC,  TRIG, CHOLHDL, LDLDIRECT in the last 72 hours. Thyroid Function Tests: No results for input(s): TSH, T4TOTAL, FREET4, T3FREE, THYROIDAB in the last 72 hours. Anemia Panel: No results for input(s): VITAMINB12, FOLATE, FERRITIN, TIBC, IRON, RETICCTPCT in the last 72 hours. Sepsis Labs: No results for input(s): PROCALCITON, LATICACIDVEN in the last 168 hours.  Recent Results (from the past 240 hour(s))  Resp Panel by RT-PCR (Flu A&B, Covid) Nasopharyngeal Swab     Status: None   Collection Time: 01/28/21 11:12 PM   Specimen: Nasopharyngeal Swab; Nasopharyngeal(NP) swabs in vial transport medium  Result Value Ref Range Status   SARS Coronavirus 2 by RT PCR NEGATIVE NEGATIVE Final    Comment: (NOTE) SARS-CoV-2 target nucleic acids are NOT DETECTED.  The SARS-CoV-2 RNA is generally detectable in upper respiratory specimens during the acute phase of infection. The lowest concentration of SARS-CoV-2 viral copies this assay can detect is 138 copies/mL. A negative result does not preclude SARS-Cov-2 infection and should not be used as the sole basis for treatment or  other patient management decisions. A negative result may occur with  improper specimen collection/handling, submission of specimen other than nasopharyngeal swab, presence of viral mutation(s) within the areas targeted by this assay, and inadequate number of viral copies(<138 copies/mL). A negative result must be combined with clinical observations, patient history, and epidemiological information. The expected result is Negative.  Fact Sheet for Patients:  EntrepreneurPulse.com.au  Fact Sheet for Healthcare Providers:  IncredibleEmployment.be  This test is no t yet approved or cleared by the [place] FDA and  has been authorized for detection and/or diagnosis of SARS-CoV-2 by FDA under an Emergency Use Authorization (EUA). This EUA will remain  in effect (meaning this test can be  used) for the duration of the COVID-19 declaration under Section 564(b)(1) of the Act, 21 U.S.C.section 360bbb-3(b)(1), unless the authorization is terminated  or revoked sooner.       Influenza A by PCR NEGATIVE NEGATIVE Final   Influenza B by PCR NEGATIVE NEGATIVE Final    Comment: (NOTE) The Xpert Xpress SARS-CoV-2/FLU/RSV plus assay is intended as an aid in the diagnosis of influenza from Nasopharyngeal swab specimens and should not be used as a sole basis for treatment. Nasal washings and aspirates are unacceptable for Xpert Xpress SARS-CoV-2/FLU/RSV testing.  Fact Sheet for Patients: EntrepreneurPulse.com.au  Fact Sheet for Healthcare Providers: IncredibleEmployment.be  This test is not yet approved or cleared by the [place] FDA and has been authorized for detection and/or diagnosis of SARS-CoV-2 by FDA under an Emergency Use Authorization (EUA). This EUA will remain in effect (meaning this test can be used) for the duration of the COVID-19 declaration under Section 564(b)(1) of the Act, 21 U.S.C. section 360bbb-3(b)(1), unless the authorization is terminated or revoked.  Performed at [facility], [place] [address], [place], [place] [address]     Radiology Studies: DG Chest 2 View  Result Date: 01/28/2021 CLINICAL DATA:  Shortness of breath and weakness EXAM: CHEST - 2 VIEW COMPARISON:  07/22/2019 FINDINGS: Cardiac shadow is mildly enlarged but stable. Aortic calcifications are again seen. Postsurgical changes are noted. The lungs are well aerated bilaterally. Mild vascular congestion is seen without significant interstitial edema. Mild blunting of the costophrenic angles is noted and stable. No bony abnormality is noted. IMPRESSION: Mild CHF without significant edema. Electronically Signed   By: Inez Catalina M.D.   On: 01/28/2021 19:17   ECHOCARDIOGRAM COMPLETE  Result Date: 01/29/2021    ECHOCARDIOGRAM REPORT   Patient Name:    Fumio W Collier Date of Exam: 01/29/2021 Medical Rec #:  [id number]        Height:       68.0 in Accession #:    [id number]       Weight:       199.7 lb Date of Birth:  06/23/1946        BSA:          2.042 m Patient Age:    84 years         BP:           154/60 mmHg Patient Gender: M                HR:           57 bpm. Exam Location:  Inpatient Procedure: 2D Echo, 3D Echo, Cardiac Doppler, Color Doppler and Strain Analysis Indications:    CHF-Acute Diastolic P82.42  History:        Patient has prior history of Echocardiogram examinations, most  recent 08/02/2019. CAD, Signs/Symptoms:Dyspnea; Risk                 Factors:Hypertension. Orthopnea, Actue on chronic diastolic CHF,                 Chronic kidney disease, Anemia.  Sonographer:    Darlina Sicilian RDCS Referring Phys: [id number] [place]  1. Left ventricular ejection fraction, by estimation, is 45 to 50%. The left ventricle has mildly decreased function. The left ventricle demonstrates global hypokinesis. Left ventricular diastolic parameters are consistent with Grade II diastolic dysfunction (pseudonormalization). The average left ventricular global longitudinal strain is -13.9 %. The global longitudinal strain is abnormal.  2. Right ventricular systolic function is normal. The right ventricular size is normal. There is mildly elevated pulmonary artery systolic pressure. The estimated right ventricular systolic pressure is 05.3 mmHg.  3. Left atrial size was mildly dilated.  4. The mitral valve is normal in structure. Moderate mitral valve regurgitation. No evidence of mitral stenosis.  5. Tricuspid valve regurgitation is mild to moderate.  6. The aortic valve is normal in structure. Aortic valve regurgitation is trivial. Mild aortic valve sclerosis is present, with no evidence of aortic valve stenosis.  7. The inferior vena cava is normal in size with greater than 50% respiratory variability, suggesting right atrial pressure  of 3 mmHg. Comparison(s): Prior images reviewed side by side. The left ventricular function is worsened. FINDINGS  Left Ventricle: Left ventricular ejection fraction, by estimation, is 45 to 50%. The left ventricle has mildly decreased function. The left ventricle demonstrates global hypokinesis. The average left ventricular global longitudinal strain is -13.9 %. The global longitudinal strain is abnormal. The left ventricular internal cavity size was normal in size. There is no left ventricular hypertrophy. Left ventricular diastolic parameters are consistent with Grade II diastolic dysfunction (pseudonormalization). Right Ventricle: The right ventricular size is normal. No increase in right ventricular wall thickness. Right ventricular systolic function is normal. There is mildly elevated pulmonary artery systolic pressure. The tricuspid regurgitant velocity is 3.11  m/s, and with an assumed right atrial pressure of 3 mmHg, the estimated right ventricular systolic pressure is 97.6 mmHg. Left Atrium: Left atrial size was mildly dilated. Right Atrium: Right atrial size was normal in size. Pericardium: There is no evidence of pericardial effusion. Mitral Valve: The mitral valve is normal in structure. There is moderate thickening of the mitral valve leaflet(s). There is moderate calcification of the mitral valve leaflet(s). Mild mitral annular calcification. Moderate mitral valve regurgitation. No  evidence of mitral valve stenosis. Tricuspid Valve: The tricuspid valve is normal in structure. Tricuspid valve regurgitation is mild to moderate. No evidence of tricuspid stenosis. Aortic Valve: The aortic valve is normal in structure. Aortic valve regurgitation is trivial. Mild aortic valve sclerosis is present, with no evidence of aortic valve stenosis. Aortic valve mean gradient measures 6.3 mmHg. Aortic valve peak gradient measures 12.3 mmHg. Aortic valve area, by VTI measures 1.86 cm. Pulmonic Valve: The pulmonic  valve was normal in structure. Pulmonic valve regurgitation is not visualized. No evidence of pulmonic stenosis. Aorta: The aortic root is normal in size and structure. Venous: The inferior vena cava is normal in size with greater than 50% respiratory variability, suggesting right atrial pressure of 3 mmHg. IAS/Shunts: No atrial level shunt detected by color flow Doppler.  LEFT VENTRICLE PLAX 2D LVIDd:         5.50 cm  Diastology LVIDs:         4.20  cm  LV e' medial:    3.47 cm/s LV PW:         1.00 cm  LV E/e' medial:  33.1 LV IVS:        1.20 cm  LV e' lateral:   5.39 cm/s LVOT diam:     2.00 cm  LV E/e' lateral: 21.3 LV SV:         75 LV SV Index:   37       2D Longitudinal Strain LVOT Area:     3.14 cm 2D Strain GLS Avg:     -13.9 %                          3D Volume EF:                         3D EF:        47 %                         LV EDV:       197 ml                         LV ESV:       105 ml                         LV SV:        92 ml RIGHT VENTRICLE RV S prime:     8.38 cm/s TAPSE (M-mode): 1.8 cm LEFT ATRIUM             Index       RIGHT ATRIUM           Index LA diam:        5.20 cm 2.55 cm/m  RA Area:     20.60 cm LA Vol (A2C):   56.9 ml 27.86 ml/m RA Volume:   52.60 ml  25.75 ml/m LA Vol (A4C):   43.8 ml 21.44 ml/m LA Biplane Vol: 50.9 ml 24.92 ml/m  AORTIC VALVE                    PULMONIC VALVE AV Area (Vmax):    1.63 cm     PV Vmax:       1.21 m/s AV Area (Vmean):   1.68 cm     PV Vmean:      74.000 cm/s AV Area (VTI):     1.86 cm     PV VTI:        0.258 m AV Vmax:           175.33 cm/s  PV Peak grad:  5.9 mmHg AV Vmean:          114.000 cm/s PV Mean grad:  3.0 mmHg AV VTI:            0.404 m AV Peak Grad:      12.3 mmHg AV Mean Grad:      6.3 mmHg LVOT Vmax:         90.80 cm/s LVOT Vmean:        61.000 cm/s LVOT VTI:          0.240 m LVOT/AV VTI ratio: 0.59  AORTA Ao Root diam: 3.00 cm Ao Asc diam:  3.40 cm MITRAL VALVE  TRICUSPID VALVE MV Area (PHT):  7.16 cm     TR  Peak grad:   38.7 mmHg MV Area (plan): 5.86 cm     TR Vmax:        311.00 cm/s MV Decel Time:  106 msec MR Peak grad:    121.9 mmHg  SHUNTS MR Mean grad:    83.0 mmHg   Systemic VTI:  0.24 m MR Vmax:         552.00 cm/s Systemic Diam: 2.00 cm MR Vmean:        434.0 cm/s MR PISA:         1.01 cm MR PISA Eff ROA: 6 mm MR PISA Radius:  0.40 cm MV E velocity: 115.00 cm/s MV A velocity: 84.50 cm/s MV E/A ratio:  1.36 Candee Furbish MD Electronically signed by Candee Furbish MD Signature Date/Time: 01/29/2021/1:04:10 PM    Final      Scheduled Meds:  amLODipine  10 mg Oral QHS   carvedilol  3.125 mg Oral BID WC   clopidogrel  75 mg Oral Daily   ezetimibe  10 mg Oral Daily   fenofibrate  160 mg Oral Daily   hydrALAZINE  100 mg Oral TID   icosapent Ethyl  2 g Oral BID   insulin aspart  0-9 Units Subcutaneous Q4H   isosorbide mononitrate  30 mg Oral QHS   isosorbide mononitrate  60 mg Oral Daily   pantoprazole  40 mg Oral Daily   Continuous Infusions:  heparin 1,300 Units/hr (01/29/21 0708)     LOS: 1 day    Time spent: 35 mins    Mahaley Schwering, MD [facility]   If 7PM-7AM, please contact night-coverage

## 2021-01-30 DIAGNOSIS — E1151 Type 2 diabetes mellitus with diabetic peripheral angiopathy without gangrene: Secondary | ICD-10-CM

## 2021-01-30 DIAGNOSIS — I25119 Atherosclerotic heart disease of native coronary artery with unspecified angina pectoris: Secondary | ICD-10-CM | POA: Diagnosis not present

## 2021-01-30 DIAGNOSIS — Z794 Long term (current) use of insulin: Secondary | ICD-10-CM

## 2021-01-30 DIAGNOSIS — I5033 Acute on chronic diastolic (congestive) heart failure: Secondary | ICD-10-CM | POA: Diagnosis not present

## 2021-01-30 LAB — BASIC METABOLIC PANEL
Anion gap: 8 (ref 5–15)
BUN: 47 mg/dL — ABNORMAL HIGH (ref 8–23)
CO2: 25 mmol/L (ref 22–32)
Calcium: 9 mg/dL (ref 8.9–10.3)
Chloride: 103 mmol/L (ref 98–111)
Creatinine, Ser: 2.93 mg/dL — ABNORMAL HIGH (ref 0.61–1.24)
GFR, Estimated: 21 mL/min — ABNORMAL LOW (ref >60)
Glucose, Bld: 215 mg/dL — ABNORMAL HIGH (ref 70–99)
Potassium: 3.6 mmol/L (ref 3.5–5.1)
Sodium: 136 mmol/L (ref 135–145)

## 2021-01-30 LAB — GLUCOSE, CAPILLARY
Glucose-Capillary: 117 mg/dL — ABNORMAL HIGH (ref 70–99)
Glucose-Capillary: 209 mg/dL — ABNORMAL HIGH (ref 70–99)

## 2021-01-30 LAB — HEPARIN LEVEL (UNFRACTIONATED): Heparin Unfractionated: 0.28 IU/mL — ABNORMAL LOW (ref 0.30–0.70)

## 2021-01-30 NOTE — Progress Notes (Signed)
[place] for Heparin Indication: chest pain/ACS  Labs: Recent Labs    01/28/21 1829 01/28/21 2110 01/29/21 0556 01/29/21 1345 01/30/21 0321  HGB 9.1*  --   --   --   --   HCT 28.6*  --   --   --   --   PLT 214  --   --   --   --   LABPROT 13.2  --   --   --   --   INR 1.0  --   --   --   --   HEPARINUNFRC  --   --  0.21* 0.33 0.28*  CREATININE 3.31*  --  3.12* 3.04*  --   TROPONINIHS 760* 870*  --   --   --    Assessment: 60 yom with above history presenting with worsening shortness of breath. Pharmacy consulted for heparin dosing for ACS. Patient not on anticoagulation prior to arrival. Per notes he prefers not to have a cath unless necessary.  -heparin level below goal on 1300 units/hr   Goal of Therapy:  Heparin level 0.3-0.7 units/ml Monitor platelets by anticoagulation protocol: Yes   Plan:  -Increase heparin to 1400 units/hr -Anticipate ~ 48 hrs heparin (likely d/c 8/11 in the evening) -Daily heparin level and CBC  Hildred Laser, PharmD Clinical Pharmacist **Pharmacist phone directory can now be found on [email or web address] ([email or web address]).  Listed under [place].

## 2021-01-30 NOTE — Progress Notes (Addendum)
Progress Note  Patient Name: Lance Collier Date of Encounter: 01/30/2021  [facility] Cardiologist: Glenetta Hew, MD   Subjective   Pt states he has no complaints, LE swelling L > R is at his baseline. He reports sleeping supine last night. Anxious to discharge home.  Inpatient Medications    Scheduled Meds:  amLODipine  10 mg Oral QHS   carvedilol  3.125 mg Oral BID WC   clopidogrel  75 mg Oral Daily   ezetimibe  10 mg Oral Daily   fenofibrate  160 mg Oral Daily   hydrALAZINE  100 mg Oral TID   icosapent Ethyl  2 g Oral BID   insulin aspart  0-9 Units Subcutaneous Q4H   isosorbide mononitrate  30 mg Oral QHS   isosorbide mononitrate  60 mg Oral Daily   pantoprazole  40 mg Oral Daily   [START ON 01/31/2021] rosuvastatin  10 mg Oral Once per day on Sun Tue Thu Sat   Continuous Infusions:  heparin 1,300 Units/hr (01/29/21 1827)   PRN Meds: docusate sodium   Vital Signs    Vitals:   01/29/21 1650 01/29/21 2100 01/30/21 0048 01/30/21 0343  BP: (!) 134/54 139/67 (!) 128/53   Pulse: (!) 51 (!) 57 (!) 57   Resp:  17 17   Temp:  97.7 F (36.5 C) 97.6 F (36.4 C)   TempSrc:  Oral Oral   SpO2:  96% 94%   Weight:    89.1 kg  Height:        Intake/Output Summary (Last 24 hours) at 01/30/2021 0704 Last data filed at 01/30/2021 0300 Gross per 24 hour  Intake 605.21 ml  Output 2050 ml  Net -1444.79 ml   Last 3 Weights 01/30/2021 01/29/2021 01/28/2021  Weight (lbs) 196 lb 6.4 oz 199 lb 11.2 oz 195 lb  Weight (kg) 89.086 kg 90.583 kg 88.451 kg      Telemetry    Sinus brady with PACs, in the 95s - Personally Reviewed  ECG    No new tracings - Personally Reviewed  Physical Exam   GEN: No acute distress.   Neck: minimal JVD Cardiac: irregular rhythm, bradycardic rate Respiratory: Clear to auscultation bilaterally. GI: Soft, nontender, non-distended  MS: B LE edema L > R Neuro:  Nonfocal  Psych: Normal affect   Labs    High Sensitivity Troponin:    Recent Labs  Lab 01/28/21 1829 01/28/21 2110  TROPONINIHS 760* 870*      Chemistry Recent Labs  Lab 01/28/21 1829 01/29/21 0556 01/29/21 1345  NA 133* 136 136  K 3.6 3.2* 3.6  CL 97* 99 100  CO2 25 27 25   GLUCOSE 186* 85 152*  BUN 53* 53* 49*  CREATININE 3.31* 3.12* 3.04*  CALCIUM 9.0 9.2 9.1  GFRNONAA 19* 20* 21*  ANIONGAP 11 10 11      Hematology Recent Labs  Lab 01/28/21 1829  WBC 6.6  RBC 2.89*  HGB 9.1*  HCT 28.6*  MCV 99.0  MCH 31.5  MCHC 31.8  RDW 13.1  PLT 214    BNP Recent Labs  Lab 01/28/21 1829  BNP 1,266.4*     DDimer No results for input(s): DDIMER in the last 168 hours.   Radiology    DG Chest 2 View  Result Date: 01/28/2021 CLINICAL DATA:  Shortness of breath and weakness EXAM: CHEST - 2 VIEW COMPARISON:  07/22/2019 FINDINGS: Cardiac shadow is mildly enlarged but stable. Aortic calcifications are again seen. Postsurgical changes are  noted. The lungs are well aerated bilaterally. Mild vascular congestion is seen without significant interstitial edema. Mild blunting of the costophrenic angles is noted and stable. No bony abnormality is noted. IMPRESSION: Mild CHF without significant edema. Electronically Signed   By: Inez Catalina M.D.   On: 01/28/2021 19:17   ECHOCARDIOGRAM COMPLETE  Result Date: 01/29/2021    ECHOCARDIOGRAM REPORT   Patient Name:   Lance Collier Date of Exam: 01/29/2021 Medical Rec #:  [id number]        Height:       68.0 in Accession #:    [id number]       Weight:       199.7 lb Date of Birth:  1945/10/03        BSA:          2.042 m Patient Age:    75 years         BP:           154/60 mmHg Patient Gender: M                HR:           57 bpm. Exam Location:  Inpatient Procedure: 2D Echo, 3D Echo, Cardiac Doppler, Color Doppler and Strain Analysis Indications:    CHF-Acute Diastolic Q22.97  History:        Patient has prior history of Echocardiogram examinations, most                 recent 08/02/2019. CAD,  Signs/Symptoms:Dyspnea; Risk                 Factors:Hypertension. Orthopnea, Actue on chronic diastolic CHF,                 Chronic kidney disease, Anemia.  Sonographer:    Darlina Sicilian RDCS Referring Phys: [id number] [place]  1. Left ventricular ejection fraction, by estimation, is 45 to 50%. The left ventricle has mildly decreased function. The left ventricle demonstrates global hypokinesis. Left ventricular diastolic parameters are consistent with Grade II diastolic dysfunction (pseudonormalization). The average left ventricular global longitudinal strain is -13.9 %. The global longitudinal strain is abnormal.  2. Right ventricular systolic function is normal. The right ventricular size is normal. There is mildly elevated pulmonary artery systolic pressure. The estimated right ventricular systolic pressure is 41.7 mmHg.  3. Left atrial size was mildly dilated.  4. The mitral valve is normal in structure. Moderate mitral valve regurgitation. No evidence of mitral stenosis.  5. Tricuspid valve regurgitation is mild to moderate.  6. The aortic valve is normal in structure. Aortic valve regurgitation is trivial. Mild aortic valve sclerosis is present, with no evidence of aortic valve stenosis.  7. The inferior vena cava is normal in size with greater than 50% respiratory variability, suggesting right atrial pressure of 3 mmHg. Comparison(s): Prior images reviewed side by side. The left ventricular function is worsened. FINDINGS  Left Ventricle: Left ventricular ejection fraction, by estimation, is 45 to 50%. The left ventricle has mildly decreased function. The left ventricle demonstrates global hypokinesis. The average left ventricular global longitudinal strain is -13.9 %. The global longitudinal strain is abnormal. The left ventricular internal cavity size was normal in size. There is no left ventricular hypertrophy. Left ventricular diastolic parameters are consistent with Grade II  diastolic dysfunction (pseudonormalization). Right Ventricle: The right ventricular size is normal. No increase in right ventricular wall thickness. Right ventricular systolic function is normal. There is mildly elevated  pulmonary artery systolic pressure. The tricuspid regurgitant velocity is 3.11  m/s, and with an assumed right atrial pressure of 3 mmHg, the estimated right ventricular systolic pressure is 62.7 mmHg. Left Atrium: Left atrial size was mildly dilated. Right Atrium: Right atrial size was normal in size. Pericardium: There is no evidence of pericardial effusion. Mitral Valve: The mitral valve is normal in structure. There is moderate thickening of the mitral valve leaflet(s). There is moderate calcification of the mitral valve leaflet(s). Mild mitral annular calcification. Moderate mitral valve regurgitation. No  evidence of mitral valve stenosis. Tricuspid Valve: The tricuspid valve is normal in structure. Tricuspid valve regurgitation is mild to moderate. No evidence of tricuspid stenosis. Aortic Valve: The aortic valve is normal in structure. Aortic valve regurgitation is trivial. Mild aortic valve sclerosis is present, with no evidence of aortic valve stenosis. Aortic valve mean gradient measures 6.3 mmHg. Aortic valve peak gradient measures 12.3 mmHg. Aortic valve area, by VTI measures 1.86 cm. Pulmonic Valve: The pulmonic valve was normal in structure. Pulmonic valve regurgitation is not visualized. No evidence of pulmonic stenosis. Aorta: The aortic root is normal in size and structure. Venous: The inferior vena cava is normal in size with greater than 50% respiratory variability, suggesting right atrial pressure of 3 mmHg. IAS/Shunts: No atrial level shunt detected by color flow Doppler.  LEFT VENTRICLE PLAX 2D LVIDd:         5.50 cm  Diastology LVIDs:         4.20 cm  LV e' medial:    3.47 cm/s LV PW:         1.00 cm  LV E/e' medial:  33.1 LV IVS:        1.20 cm  LV e' lateral:   5.39 cm/s  LVOT diam:     2.00 cm  LV E/e' lateral: 21.3 LV SV:         75 LV SV Index:   37       2D Longitudinal Strain LVOT Area:     3.14 cm 2D Strain GLS Avg:     -13.9 %                          3D Volume EF:                         3D EF:        47 %                         LV EDV:       197 ml                         LV ESV:       105 ml                         LV SV:        92 ml RIGHT VENTRICLE RV S prime:     8.38 cm/s TAPSE (M-mode): 1.8 cm LEFT ATRIUM             Index       RIGHT ATRIUM           Index LA diam:        5.20 cm 2.55 cm/m  RA Area:     20.60 cm  LA Vol (A2C):   56.9 ml 27.86 ml/m RA Volume:   52.60 ml  25.75 ml/m LA Vol (A4C):   43.8 ml 21.44 ml/m LA Biplane Vol: 50.9 ml 24.92 ml/m  AORTIC VALVE                    PULMONIC VALVE AV Area (Vmax):    1.63 cm     PV Vmax:       1.21 m/s AV Area (Vmean):   1.68 cm     PV Vmean:      74.000 cm/s AV Area (VTI):     1.86 cm     PV VTI:        0.258 m AV Vmax:           175.33 cm/s  PV Peak grad:  5.9 mmHg AV Vmean:          114.000 cm/s PV Mean grad:  3.0 mmHg AV VTI:            0.404 m AV Peak Grad:      12.3 mmHg AV Mean Grad:      6.3 mmHg LVOT Vmax:         90.80 cm/s LVOT Vmean:        61.000 cm/s LVOT VTI:          0.240 m LVOT/AV VTI ratio: 0.59  AORTA Ao Root diam: 3.00 cm Ao Asc diam:  3.40 cm MITRAL VALVE                 TRICUSPID VALVE MV Area (PHT):  7.16 cm     TR Peak grad:   38.7 mmHg MV Area (plan): 5.86 cm     TR Vmax:        311.00 cm/s MV Decel Time:  106 msec MR Peak grad:    121.9 mmHg  SHUNTS MR Mean grad:    83.0 mmHg   Systemic VTI:  0.24 m MR Vmax:         552.00 cm/s Systemic Diam: 2.00 cm MR Vmean:        434.0 cm/s MR PISA:         1.01 cm MR PISA Eff ROA: 6 mm MR PISA Radius:  0.40 cm MV E velocity: 115.00 cm/s MV A velocity: 84.50 cm/s MV E/A ratio:  1.36 Candee Furbish MD Electronically signed by Candee Furbish MD Signature Date/Time: 01/29/2021/1:04:10 PM    Final     Cardiac Studies   Echo 01/29/21: 1. Left  ventricular ejection fraction, by estimation, is 45 to 50%. The  left ventricle has mildly decreased function. The left ventricle  demonstrates global hypokinesis. Left ventricular diastolic parameters are  consistent with Grade II diastolic  dysfunction (pseudonormalization). The average left ventricular global  longitudinal strain is -13.9 %. The global longitudinal strain is  abnormal.   2. Right ventricular systolic function is normal. The right ventricular  size is normal. There is mildly elevated pulmonary artery systolic  pressure. The estimated right ventricular systolic pressure is 78.2 mmHg.   3. Left atrial size was mildly dilated.   4. The mitral valve is normal in structure. Moderate mitral valve  regurgitation. No evidence of mitral stenosis.   5. Tricuspid valve regurgitation is mild to moderate.   6. The aortic valve is normal in structure. Aortic valve regurgitation is  trivial. Mild aortic valve sclerosis is present, with no evidence of  aortic valve stenosis.   7. The inferior  vena cava is normal in size with greater than 50%  respiratory variability, suggesting right atrial pressure of 3 mmHg.  Patient Profile     75 y.o. male with a hx of CAD (PCI followed by CABG then PCI to SVG-LCx and RCA), HFpEF, CKD-4, DM-2 with PN and retinopathy, severe PAD with limiting claudication, was admitted 08/08 with CHF  Assessment & Plan    Acute on chronic diastolic heart failure Low normal LVEF CKD stage IV Hypertension - echo with LVEF 45-50%, grade 2 DD, moderate MR, mild to moderate TR - received 80 mg IV lasix x 2 doses and is 2.5 L net negative with 2L urine output yesterday, down 3 lbs overnight, near dry weight - creatinine has not resulted today - will hold off on additional diuretic  - continue imdur, hydralazine, coreg, amlodipine - pressure well-controlled - per nephrology, will avoid metolazone   CAD s/p CABG with subsequent PCI - known disease - elevated  troponin flat - 760 --> 870 - pt refusing HD, no plans for heart cath - continue plavix, remaining medications as above   Hyperlipidemia with LDL goal < 70 - continue zetia, fenofibrate, vascepa, and 10 mg crestor   DM - A1c improved to 6.8% (7.3%)   Hospital follow up has been arranged.       For questions or updates, please contact [place] Please consult [email or web address] for contact info under        Signed, Ledora Bottcher, PA  01/30/2021, 7:04 AM     Attending Note:   The patient was seen and examined.  Agree with assessment and plan as noted above.  Changes made to the above note as needed.  Patient seen and independently examined with Doreene Adas, PA .   We discussed all aspects of the encounter. I agree with the assessment and plan as stated above.      Acute on chronic diastolic CHF:   EF is low normal by echo this am Still has some volume overload Eats a very salty diet - country ham, hot dogs regularly Advised against eating so much salt. Lasix 80 mg IV today Reevaluate tomorrow am   CKD- stage IV .  Creatinine is 2.9.      3.  Hx of CAD:  troponin is flat.   Likely due to his underlying cAD and volume overload I would not want to do a cath unless absolutely needed.   He has a creatinine of 3.1 and says he refuses to ever consider dialysis He is not having any angina Cont medical therapy .     I have spent a total of 40 minutes with patient reviewing hospital  notes , telemetry, EKGs, labs and examining patient as well as establishing an assessment and plan that was discussed with the patient.  > 50% of time was spent in direct patient care.    Thayer Headings, Brooke Bonito., MD, [facility] 01/30/2021, 11:55 AM [address]  [place] Pager [phone]

## 2021-01-30 NOTE — Discharge Summary (Signed)
Physician Discharge Summary  Burlie W [name] [facility]GH:[id number] DOB: [birth year] DOA: 01/28/2021  PCP: Reynold Bowen, MD  Admit date: 01/28/2021 Discharge date: 01/30/2021  Admitted From: Home Disposition:  Home   Recommendations for Outpatient Follow-up:  Follow up with PCP in 1-2 weeks Please obtain BMP/CBC in one week  Home Health:NO  Discharge Condition:Stable CODE STATUS:FULL Diet recommendation: Heart Healthy /   Brief/Interim Summary:  This 75 years old male with PMH significant for CAD s/p CABG and PCI, chronic diastolic CHF, CKD stage IV, insulin-dependent type 2 diabetes, hypertension, hyperlipidemia, PAD, carotid artery disease s/p left CEA presented in the ED with complaints of worsening shortness of breath and orthopnea.  Patient is not hypoxic.  High-sensitivity troponin elevated 760> 870.  EKG shows diffuse ST depression and T wave inversions more pronounced compared to the prior EKG in October 2021. Cardiology was consulted and patient was started on IV heparin.  Patient has been noncompliant with diet recommendation. He reports that his cardiologist and nephrologist has advised him about dialysis but he does not want it under any circumstances. Patient is admitted for acute on chronic diastolic CHF.    Acute on chronic diastolic CHF: Patient presented with significant shortness of breath and orthopnea. He appears volume noted on exam.  BNP 1266.  Chest x-ray mild vascular congestion without significant interstitial edema . He is not hypoxic at rest.  LVEF 65 to 67% and diastolic parameters indeterminate per echo done February 2021. Cardiology consulted, patient was diuresed with cardiology with IV Lasix as needed, patient is 2.5 L net negative, he is -2 L urine output yesterday, with down 3 pounds overnight, near his dry weight, further need for IV diuresis, patient will be discharged on home dose Lasix .   CAD status post CABG and PCI. Patient is having elevated troponin  (760 >870).  EKG showing diffuse ST depressions and T wave inversions, more pronounced as compared to prior EKG.  Patient reports shortness of breath but denies any chest pain. .   Since patient has refused hemodialysis, cardiology not planning on any interventional procedure.  Recommendation is to continue IV heparin for 48 hours.  Resume home medications including Plavix, Repatha, Crestor, fenofibrate, Vascepa, carvedilol, amlodipine, and Imdur    CKD stage IV Creatinine 3.3, stable compared to labs done in March 2021.   Insulin-dependent type 2 diabetes Continue with home regimen on discharge   Hypertension Continue carvedilol, amlodipine, hydralazine, and Imdur   Hyperlipidemia Continue Repatha, Crestor, fenofibrate, and Vascepa   Discharge Diagnoses:  Principal Problem:   CHF exacerbation ([place]) Active Problems:   CAD (coronary artery disease)   Diabetes mellitus ([place])   Essential hypertension   Chronic kidney disease (CKD), stage IV (severe) ([place])    Discharge Instructions  Discharge Instructions     Diet - low sodium heart healthy   Complete by: As directed    Discharge instructions   Complete by: As directed    Follow with Primary MD Reynold Bowen, MD in 7 days   Get CBC, CMP,  checked  by Primary MD next visit.    Activity: As tolerated with Full fall precautions use walker/cane & assistance as needed   Disposition Home    Diet: Heart Healthy /carb modified , with feeding assistance and aspiration precautions.  For Heart failure patients - Check your Weight same time everyday, if you gain over 2 pounds, or you develop in leg swelling, experience more shortness of breath or chest pain, call your Primary MD immediately.  Follow Cardiac Low Salt Diet and 1.5 lit/day fluid restriction.   On your next visit with your primary care physician please Get Medicines reviewed and adjusted.   Please request your Prim.MD to go over all Hospital Tests and  Procedure/Radiological results at the follow up, please get all Hospital records sent to your Prim MD by signing hospital release before you go home.   If you experience worsening of your admission symptoms, develop shortness of breath, life threatening emergency, suicidal or homicidal thoughts you must seek medical attention immediately by calling 911 or calling your MD immediately  if symptoms less severe.  You Must read complete instructions/literature along with all the possible adverse reactions/side effects for all the Medicines you take and that have been prescribed to you. Take any new Medicines after you have completely understood and accpet all the possible adverse reactions/side effects.   Do not drive, operating heavy machinery, perform activities at heights, swimming or participation in water activities or provide baby sitting services if your were admitted for syncope or siezures until you have seen by Primary MD or a Neurologist and advised to do so again.  Do not drive when taking Pain medications.    Do not take more than prescribed Pain, Sleep and Anxiety Medications  Special Instructions: If you have smoked or chewed Tobacco  in the last 2 yrs please stop smoking, stop any regular Alcohol  and or any Recreational drug use.  Wear Seat belts while driving.   Please note  You were cared for by a hospitalist during your hospital stay. If you have any questions about your discharge medications or the care you received while you were in the hospital after you are discharged, you can call the unit and asked to speak with the hospitalist on call if the hospitalist that took care of you is not available. Once you are discharged, your primary care physician will handle any further medical issues. Please note that NO REFILLS for any discharge medications will be authorized once you are discharged, as it is imperative that you return to your primary care physician (or establish a  relationship with a primary care physician if you do not have one) for your aftercare needs so that they can reassess your need for medications and monitor your lab values.   Increase activity slowly   Complete by: As directed       Allergies as of 01/30/2021       Reactions   Atorvastatin Other (See Comments)   Leg pain   Ibuprofen Hives   Pravastatin Other (See Comments)   Leg pain   Simvastatin Other (See Comments)   Leg pain        Medication List     TAKE these medications    acetaminophen 325 MG tablet Commonly known as: TYLENOL Take 650 mg by mouth daily as needed for mild pain or headache.   amLODipine 10 MG tablet Commonly known as: NORVASC Take 10 mg by mouth at bedtime.   carvedilol 12.5 MG tablet Commonly known as: COREG TAKE 1 TABLET BY MOUTH TWICE DAILY. What changed: when to take this   clopidogrel 75 MG tablet Commonly known as: PLAVIX TAKE 1 TABLET DAILY WITH BREAKFAST. What changed: when to take this   docusate sodium 100 MG capsule Commonly known as: COLACE Take 100 mg by mouth daily as needed for mild constipation.   ezetimibe 10 MG tablet Commonly known as: ZETIA Take 10 mg by mouth daily.   fenofibrate  160 MG tablet TAKE 1 TABLET ONCE DAILY.   FreeStyle Libre 14 Day Sensor Misc MONITOR BS CONTINUOUSLY  CHANGE Q 14 DAYS.   furosemide 80 MG tablet Commonly known as: LASIX Take 160 mg by mouth 2 (two) times daily. MAY TAKE AN EXTRA 80 MG IF NEEDED FOR INCREASE SWELLING OR SHORTNESS OF BREATH LYING DOWN.   HumaLOG KwikPen 200 UNIT/ML KwikPen Generic drug: insulin lispro Inject 20-28 Units into the skin See admin instructions. Use 20 units (base) per meal increase units given by 1 unit for every 15 increment over 150 (I.E. Blood glucose reading 165=21 units, 180=22 units, etc. )   hydrALAZINE 100 MG tablet Commonly known as: APRESOLINE TAKE ONE TABLET BY MOUTH THREE TIMES DAILY   isosorbide mononitrate 60 MG 24 hr tablet Commonly  known as: IMDUR TAKE 1 TABLET ONCE DAILY. What changed: Another medication with the same name was changed. Make sure you understand how and when to take each.   isosorbide mononitrate 30 MG 24 hr tablet Commonly known as: IMDUR TAKE 1 TABLET ONCE DAILY IN THE EVENING. What changed: See the new instructions.   pantoprazole 40 MG tablet Commonly known as: PROTONIX TAKE 1 TABLET ONCE DAILY.   rosuvastatin 10 MG tablet Commonly known as: CRESTOR TAKE 1 TABLET ON TUESDAY, THURSDAY, SATURDAY AND SUNDAY. What changed: See the new instructions.   Toujeo Max SoloStar 300 UNIT/ML Solostar Pen Generic drug: insulin glargine (2 Unit Dial) Inject 60 Units into the skin 2 (two) times daily.   Vascepa 1 g capsule Generic drug: icosapent Ethyl Take 2 g by mouth 2 (two) times daily.       ASK your doctor about these medications    Repatha SureClick 140 MG/ML Soaj Generic drug: Evolocumab INJECT CONTENTS OF 1 PEN SUBCUTANEOUSLY EVERY 14 DAYS.        Follow-up Information     South, Stephen, MD Follow up.   Specialty: Endocrinology Contact information: [address] [place] [address] [phone]         Harding, David W, MD .   Specialty: Cardiology Contact information: [address] [place] [address] [phone]                Allergies  Allergen Reactions   Atorvastatin Other (See Comments)    Leg pain   Ibuprofen Hives   Pravastatin Other (See Comments)    Leg pain   Simvastatin Other (See Comments)    Leg pain    Consultations: cardiology   Procedures/Studies: DG Chest 2 View  Result Date: 01/28/2021 CLINICAL DATA:  Shortness of breath and weakness EXAM: CHEST - 2 VIEW COMPARISON:  07/22/2019 FINDINGS: Cardiac shadow is mildly enlarged but stable. Aortic calcifications are again seen. Postsurgical changes are noted. The lungs are well aerated bilaterally. Mild vascular congestion is seen without significant interstitial  edema. Mild blunting of the costophrenic angles is noted and stable. No bony abnormality is noted. IMPRESSION: Mild CHF without significant edema. Electronically Signed   By: Mark  Lukens M.D.   On: 01/28/2021 19:17   ECHOCARDIOGRAM COMPLETE  Result Date: 01/29/2021    ECHOCARDIOGRAM REPORT   Patient Name:   Jace W Shimizu Date of Exam: 01/29/2021 Medical Rec #:  [id number]        Height:       68.0 in Accession #:    [id number]       Weight:       19 9.7 lb Date of Birth:  [birth year]  BSA:          2.042 m Patient Age:    89 years         BP:           154/60 mmHg Patient Gender: M                HR:           57 bpm. Exam Location:  Inpatient Procedure: 2D Echo, 3D Echo, Cardiac Doppler, Color Doppler and Strain Analysis Indications:    CHF-Acute Diastolic Q03.47  History:        Patient has prior history of Echocardiogram examinations, most                 recent 08/02/2019. CAD, Signs/Symptoms:Dyspnea; Risk                 Factors:Hypertension. Orthopnea, Actue on chronic diastolic CHF,                 Chronic kidney disease, Anemia.  Sonographer:    Darlina Sicilian RDCS Referring Phys: [id number] [place]  1. Left ventricular ejection fraction, by estimation, is 45 to 50%. The left ventricle has mildly decreased function. The left ventricle demonstrates global hypokinesis. Left ventricular diastolic parameters are consistent with Grade II diastolic dysfunction (pseudonormalization). The average left ventricular global longitudinal strain is -13.9 %. The global longitudinal strain is abnormal.  2. Right ventricular systolic function is normal. The right ventricular size is normal. There is mildly elevated pulmonary artery systolic pressure. The estimated right ventricular systolic pressure is 87.5 mmHg.  3. Left atrial size was mildly dilated.  4. The mitral valve is normal in structure. Moderate mitral valve regurgitation. No evidence of mitral stenosis.  5. Tricuspid valve regurgitation  is mild to moderate.  6. The aortic valve is normal in structure. Aortic valve regurgitation is trivial. Mild aortic valve sclerosis is present, with no evidence of aortic valve stenosis.  7. The inferior vena cava is normal in size with greater than 50% respiratory variability, suggesting right atrial pressure of 3 mmHg. Comparison(s): Prior images reviewed side by side. The left ventricular function is worsened. FINDINGS  Left Ventricle: Left ventricular ejection fraction, by estimation, is 45 to 50%. The left ventricle has mildly decreased function. The left ventricle demonstrates global hypokinesis. The average left ventricular global longitudinal strain is -13.9 %. The global longitudinal strain is abnormal. The left ventricular internal cavity size was normal in size. There is no left ventricular hypertrophy. Left ventricular diastolic parameters are consistent with Grade II diastolic dysfunction (pseudonormalization). Right Ventricle: The right ventricular size is normal. No increase in right ventricular wall thickness. Right ventricular systolic function is normal. There is mildly elevated pulmonary artery systolic pressure. The tricuspid regurgitant velocity is 3.11  m/s, and with an assumed right atrial pressure of 3 mmHg, the estimated right ventricular systolic pressure is 64.3 mmHg. Left Atrium: Left atrial size was mildly dilated. Right Atrium: Right atrial size was normal in size. Pericardium: There is no evidence of pericardial effusion. Mitral Valve: The mitral valve is normal in structure. There is moderate thickening of the mitral valve leaflet(s). There is moderate calcification of the mitral valve leaflet(s). Mild mitral annular calcification. Moderate mitral valve regurgitation. No  evidence of mitral valve stenosis. Tricuspid Valve: The tricuspid valve is normal in structure. Tricuspid valve regurgitation is mild to moderate. No evidence of tricuspid stenosis. Aortic Valve: The aortic valve is  normal in structure. Aortic valve regurgitation  is trivial. Mild aortic valve sclerosis is present, with no evidence of aortic valve stenosis. Aortic valve mean gradient measures 6.3 mmHg. Aortic valve peak gradient measures 12.3 mmHg. Aortic valve area, by VTI measures 1.86 cm. Pulmonic Valve: The pulmonic valve was normal in structure. Pulmonic valve regurgitation is not visualized. No evidence of pulmonic stenosis. Aorta: The aortic root is normal in size and structure. Venous: The inferior vena cava is normal in size with greater than 50% respiratory variability, suggesting right atrial pressure of 3 mmHg. IAS/Shunts: No atrial level shunt detected by color flow Doppler.  LEFT VENTRICLE PLAX 2D LVIDd:         5.50 cm  Diastology LVIDs:         4.20 cm  LV e' medial:    3.47 cm/s LV PW:         1.00 cm  LV E/e' medial:  33.1 LV IVS:        1.20 cm  LV e' lateral:   5.39 cm/s LVOT diam:     2.00 cm  LV E/e' lateral: 21.3 LV SV:         75 LV SV Index:   37       2D Longitudinal Strain LVOT Area:     3.14 cm 2D Strain GLS Avg:     -13.9 %                          3D Volume EF:                         3D EF:        47 %                         LV EDV:       197 ml                         LV ESV:       105 ml                         LV SV:        92 ml RIGHT VENTRICLE RV S prime:     8.38 cm/s TAPSE (M-mode): 1.8 cm LEFT ATRIUM             Index       RIGHT ATRIUM           Index LA diam:        5.20 cm 2.55 cm/m  RA Area:     20.60 cm LA Vol (A2C):   56.9 ml 27.86 ml/m RA Volume:   52.60 ml  25.75 ml/m LA Vol (A4C):   43.8 ml 21.44 ml/m LA Biplane Vol: 50.9 ml 24.92 ml/m  AORTIC VALVE                    PULMONIC VALVE AV Area (Vmax):    1.63 cm     PV Vmax:       1.21 m/s AV Area (Vmean):   1.68 cm     PV Vmean:      74.000 cm/s AV Area (VTI):     1.86 cm     PV VTI:        0.258 m AV Vmax:  175.33 cm/s  PV Peak grad:  5.9 mmHg AV Vmean:          114.000 cm/s PV Mean grad:  3.0 mmHg AV VTI:             0.404 m AV Peak Grad:      12.3 mmHg AV Mean Grad:      6.3 mmHg LVOT Vmax:         90.80 cm/s LVOT Vmean:        61.000 cm/s LVOT VTI:          0.240 m LVOT/AV VTI ratio: 0.59  AORTA Ao Root diam: 3.00 cm Ao Asc diam:  3.40 cm MITRAL VALVE                 TRICUSPID VALVE MV Area (PHT):  7.16 cm     TR Peak grad:   38.7 mmHg MV Area (plan): 5.86 cm     TR Vmax:        311.00 cm/s MV Decel Time:  106 msec MR Peak grad:    121.9 mmHg  SHUNTS MR Mean grad:    83.0 mmHg   Systemic VTI:  0.24 m MR Vmax:         552.00 cm/s Systemic Diam: 2.00 cm MR Vmean:        434.0 cm/s MR PISA:         1.01 cm MR PISA Eff ROA: 6 mm MR PISA Radius:  0.40 cm MV E velocity: 115.00 cm/s MV A velocity: 84.50 cm/s MV E/A ratio:  1.36 Candee Furbish MD Electronically signed by Candee Furbish MD Signature Date/Time: 01/29/2021/1:04:10 PM    Final       Subjective:  Is any chest pain or shortness of breath today, he is eager to go home today. Discharge Exam: Vitals:   01/30/21 1000 01/30/21 1202  BP:  (!) 137/58  Pulse:  75  Resp:  16  Temp: 98.1 F (36.7 C)   SpO2: 97%    Vitals:   01/30/21 0343 01/30/21 0836 01/30/21 1000 01/30/21 1202  BP:  (!) 130/52  (!) 137/58  Pulse:    75  Resp:    16  Temp:   98.1 F (36.7 C)   TempSrc:   Oral   SpO2:   97%   Weight: 89.1 kg     Height:        General: Pt is alert, awake, not in acute distress Cardiovascular: RRR, S1/S2 +, no rubs, no gallops Respiratory: CTA bilaterally, no wheezing, no rhonchi Abdominal: Soft, NT, ND, bowel sounds + Extremities:edema, R>L , no cyanosis    The results of significant diagnostics from this hospitalization (including imaging, microbiology, ancillary and laboratory) are listed below for reference.     Microbiology: Recent Results (from the past 240 hour(s))  Resp Panel by RT-PCR (Flu A&B, Covid) Nasopharyngeal Swab     Status: None   Collection Time: 01/28/21 11:12 PM   Specimen: Nasopharyngeal Swab; Nasopharyngeal(NP) swabs  in vial transport medium  Result Value Ref Range Status   SARS Coronavirus 2 by RT PCR NEGATIVE NEGATIVE Final    Comment: (NOTE) SARS-CoV-2 target nucleic acids are NOT DETECTED.  The SARS-CoV-2 RNA is generally detectable in upper respiratory specimens during the acute phase of infection. The lowest concentration of SARS-CoV-2 viral copies this assay can detect is 138 copies/mL. A negative result does not preclude SARS-Cov-2 infection and should not be used as the sole basis for treatment or  other patient management decisions. A negative result may occur with  improper specimen collection/handling, submission of specimen other than nasopharyngeal swab, presence of viral mutation(s) within the areas targeted by this assay, and inadequate number of viral copies(<138 copies/mL). A negative result must be combined with clinical observations, patient history, and epidemiological information. The expected result is Negative.  Fact Sheet for Patients:  EntrepreneurPulse.com.au  Fact Sheet for Healthcare Providers:  IncredibleEmployment.be  This test is no t yet approved or cleared by the [place] FDA and  has been authorized for detection and/or diagnosis of SARS-CoV-2 by FDA under an Emergency Use Authorization (EUA). This EUA will remain  in effect (meaning this test can be used) for the duration of the COVID-19 declaration under Section 564(b)(1) of the Act, 21 U.S.C.section 360bbb-3(b)(1), unless the authorization is terminated  or revoked sooner.       Influenza A by PCR NEGATIVE NEGATIVE Final   Influenza B by PCR NEGATIVE NEGATIVE Final    Comment: (NOTE) The Xpert Xpress SARS-CoV-2/FLU/RSV plus assay is intended as an aid in the diagnosis of influenza from Nasopharyngeal swab specimens and should not be used as a sole basis for treatment. Nasal washings and aspirates are unacceptable for Xpert Xpress  SARS-CoV-2/FLU/RSV testing.  Fact Sheet for Patients: EntrepreneurPulse.com.au  Fact Sheet for Healthcare Providers: IncredibleEmployment.be  This test is not yet approved or cleared by the [place] FDA and has been authorized for detection and/or diagnosis of SARS-CoV-2 by FDA under an Emergency Use Authorization (EUA). This EUA will remain in effect (meaning this test can be used) for the duration of the COVID-19 declaration under Section 564(b)(1) of the Act, 21 U.S.C. section 360bbb-3(b)(1), unless the authorization is terminated or revoked.  Performed at [facility], [place] [address] [place], [place] [address]      Labs: BNP (last 3 results) Recent Labs    01/28/21 1829  BNP 6,045.4*   Basic Metabolic Panel: Recent Labs  Lab 01/28/21 1829 01/29/21 0556 01/29/21 1345 01/30/21 0906  NA 133* 136 136 136  K 3.6 3.2* 3.6 3.6  CL 97* 99 100 103  CO2 25 27 25 25   GLUCOSE 186* 85 152* 215*  BUN 53* 53* 49* 47*  CREATININE 3.31* 3.12* 3.04* 2.93*  CALCIUM 9.0 9.2 9.1 9.0   Liver Function Tests: No results for input(s): AST, ALT, ALKPHOS, BILITOT, PROT, ALBUMIN in the last 168 hours. No results for input(s): LIPASE, AMYLASE in the last 168 hours. No results for input(s): AMMONIA in the last 168 hours. CBC: Recent Labs  Lab 01/28/21 1829  WBC 6.6  NEUTROABS 4.9  HGB 9.1*  HCT 28.6*  MCV 99.0  PLT 214   Cardiac Enzymes: No results for input(s): CKTOTAL, CKMB, CKMBINDEX, TROPONINI in the last 168 hours. BNP: Invalid input(s): POCBNP CBG: Recent Labs  Lab 01/29/21 1138 01/29/21 1554 01/29/21 2102 01/30/21 0735 01/30/21 1127  GLUCAP 107* 162* 202* 117* 209*   D-Dimer No results for input(s): DDIMER in the last 72 hours. Hgb A1c Recent Labs    01/29/21 0033  HGBA1C 6.8*   Lipid Profile No results for input(s): CHOL, HDL, LDLCALC, TRIG, CHOLHDL, LDLDIRECT in the last 72 hours. Thyroid function  studies No results for input(s): TSH, T4TOTAL, T3FREE, THYROIDAB in the last 72 hours.  Invalid input(s): FREET3 Anemia work up No results for input(s): VITAMINB12, FOLATE, FERRITIN, TIBC, IRON, RETICCTPCT in the last 72 hours. Urinalysis    Component Value Date/Time   COLORURINE YELLOW 02/16/2018 0957  APPEARANCEUR CLEAR 02/16/2018 0957   LABSPEC 1.010 02/16/2018 0957   PHURINE 6.0 02/16/2018 0957   GLUCOSEU NEGATIVE 02/16/2018 0957   HGBUR NEGATIVE 02/16/2018 0957   BILIRUBINUR NEGATIVE 02/16/2018 0957   KETONESUR NEGATIVE 02/16/2018 0957   PROTEINUR 30 (A) 02/16/2018 0957   UROBILINOGEN 0.2 06/29/2013 1720   NITRITE NEGATIVE 02/16/2018 0957   LEUKOCYTESUR NEGATIVE 02/16/2018 0957   Sepsis Labs Invalid input(s): PROCALCITONIN,  WBC,  LACTICIDVEN Microbiology Recent Results (from the past 240 hour(s))  Resp Panel by RT-PCR (Flu A&B, Covid) Nasopharyngeal Swab     Status: None   Collection Time: 01/28/21 11:12 PM   Specimen: Nasopharyngeal Swab; Nasopharyngeal(NP) swabs in vial transport medium  Result Value Ref Range Status   SARS Coronavirus 2 by RT PCR NEGATIVE NEGATIVE Final    Comment: (NOTE) SARS-CoV-2 target nucleic acids are NOT DETECTED.  The SARS-CoV-2 RNA is generally detectable in upper respiratory specimens during the acute phase of infection. The lowest concentration of SARS-CoV-2 viral copies this assay can detect is 138 copies/mL. A negative result does not preclude SARS-Cov-2 infection and should not be used as the sole basis for treatment or other patient management decisions. A negative result may occur with  improper specimen collection/handling, submission of specimen other than nasopharyngeal swab, presence of viral mutation(s) within the areas targeted by this assay, and inadequate number of viral copies(<138 copies/mL). A negative result must be combined with clinical observations, patient history, and epidemiological information. The expected  result is Negative.  Fact Sheet for Patients:  EntrepreneurPulse.com.au  Fact Sheet for Healthcare Providers:  IncredibleEmployment.be  This test is no t yet approved or cleared by the [place] FDA and  has been authorized for detection and/or diagnosis of SARS-CoV-2 by FDA under an Emergency Use Authorization (EUA). This EUA will remain  in effect (meaning this test can be used) for the duration of the COVID-19 declaration under Section 564(b)(1) of the Act, 21 U.S.C.section 360bbb-3(b)(1), unless the authorization is terminated  or revoked sooner.       Influenza A by PCR NEGATIVE NEGATIVE Final   Influenza B by PCR NEGATIVE NEGATIVE Final    Comment: (NOTE) The Xpert Xpress SARS-CoV-2/FLU/RSV plus assay is intended as an aid in the diagnosis of influenza from Nasopharyngeal swab specimens and should not be used as a sole basis for treatment. Nasal washings and aspirates are unacceptable for Xpert Xpress SARS-CoV-2/FLU/RSV testing.  Fact Sheet for Patients: EntrepreneurPulse.com.au  Fact Sheet for Healthcare Providers: IncredibleEmployment.be  This test is not yet approved or cleared by the [place] FDA and has been authorized for detection and/or diagnosis of SARS-CoV-2 by FDA under an Emergency Use Authorization (EUA). This EUA will remain in effect (meaning this test can be used) for the duration of the COVID-19 declaration under Section 564(b)(1) of the Act, 21 U.S.C. section 360bbb-3(b)(1), unless the authorization is terminated or revoked.  Performed at [facility], [place] [address], [place], [place] [address]      Time coordinating discharge: Over 30 minutes  SIGNED:   Phillips Climes, MD  [facility] 01/30/2021, 4:14 PM Pager   If 7PM-7AM, please contact night-coverage [email or web address] Password [email or web address]

## 2021-01-30 NOTE — Discharge Instructions (Signed)
Follow with Primary MD Reynold Bowen, MD in 7 days   Get CBC, CMP,  checked  by Primary MD next visit.    Activity: As tolerated with Full fall precautions use walker/cane & assistance as needed   Disposition Home    Diet: Heart Healthy /carb modified , with feeding assistance and aspiration precautions.  For Heart failure patients - Check your Weight same time everyday, if you gain over 2 pounds, or you develop in leg swelling, experience more shortness of breath or chest pain, call your Primary MD immediately. Follow Cardiac Low Salt Diet and 1.5 lit/day fluid restriction.   On your next visit with your primary care physician please Get Medicines reviewed and adjusted.   Please request your Prim.MD to go over all Hospital Tests and Procedure/Radiological results at the follow up, please get all Hospital records sent to your Prim MD by signing hospital release before you go home.   If you experience worsening of your admission symptoms, develop shortness of breath, life threatening emergency, suicidal or homicidal thoughts you must seek medical attention immediately by calling 911 or calling your MD immediately  if symptoms less severe.  You Must read complete instructions/literature along with all the possible adverse reactions/side effects for all the Medicines you take and that have been prescribed to you. Take any new Medicines after you have completely understood and accpet all the possible adverse reactions/side effects.   Do not drive, operating heavy machinery, perform activities at heights, swimming or participation in water activities or provide baby sitting services if your were admitted for syncope or siezures until you have seen by Primary MD or a Neurologist and advised to do so again.  Do not drive when taking Pain medications.    Do not take more than prescribed Pain, Sleep and Anxiety Medications  Special Instructions: If you have smoked or chewed Tobacco  in the  last 2 yrs please stop smoking, stop any regular Alcohol  and or any Recreational drug use.  Wear Seat belts while driving.   Please note  You were cared for by a hospitalist during your hospital stay. If you have any questions about your discharge medications or the care you received while you were in the hospital after you are discharged, you can call the unit and asked to speak with the hospitalist on call if the hospitalist that took care of you is not available. Once you are discharged, your primary care physician will handle any further medical issues. Please note that NO REFILLS for any discharge medications will be authorized once you are discharged, as it is imperative that you return to your primary care physician (or establish a relationship with a primary care physician if you do not have one) for your aftercare needs so that they can reassess your need for medications and monitor your lab values.

## 2021-01-31 ENCOUNTER — Other Ambulatory Visit: Payer: Self-pay | Admitting: Cardiology

## 2021-02-04 DIAGNOSIS — L570 Actinic keratosis: Secondary | ICD-10-CM | POA: Diagnosis not present

## 2021-02-04 DIAGNOSIS — L820 Inflamed seborrheic keratosis: Secondary | ICD-10-CM | POA: Diagnosis not present

## 2021-02-04 DIAGNOSIS — X32XXXD Exposure to sunlight, subsequent encounter: Secondary | ICD-10-CM | POA: Diagnosis not present

## 2021-02-08 ENCOUNTER — Other Ambulatory Visit: Payer: Self-pay

## 2021-02-08 DIAGNOSIS — I509 Heart failure, unspecified: Secondary | ICD-10-CM | POA: Diagnosis not present

## 2021-02-08 DIAGNOSIS — R9431 Abnormal electrocardiogram [ECG] [EKG]: Secondary | ICD-10-CM | POA: Diagnosis not present

## 2021-02-08 DIAGNOSIS — I10 Essential (primary) hypertension: Secondary | ICD-10-CM | POA: Diagnosis not present

## 2021-02-08 DIAGNOSIS — I739 Peripheral vascular disease, unspecified: Secondary | ICD-10-CM

## 2021-02-08 DIAGNOSIS — Z794 Long term (current) use of insulin: Secondary | ICD-10-CM | POA: Diagnosis not present

## 2021-02-08 DIAGNOSIS — E1151 Type 2 diabetes mellitus with diabetic peripheral angiopathy without gangrene: Secondary | ICD-10-CM | POA: Diagnosis present

## 2021-02-08 DIAGNOSIS — R0602 Shortness of breath: Secondary | ICD-10-CM | POA: Diagnosis not present

## 2021-02-08 DIAGNOSIS — R079 Chest pain, unspecified: Secondary | ICD-10-CM | POA: Diagnosis not present

## 2021-02-08 DIAGNOSIS — J984 Other disorders of lung: Secondary | ICD-10-CM | POA: Diagnosis not present

## 2021-02-08 DIAGNOSIS — E785 Hyperlipidemia, unspecified: Secondary | ICD-10-CM | POA: Diagnosis not present

## 2021-02-08 DIAGNOSIS — N185 Chronic kidney disease, stage 5: Secondary | ICD-10-CM | POA: Diagnosis present

## 2021-02-08 DIAGNOSIS — E872 Acidosis: Secondary | ICD-10-CM | POA: Diagnosis not present

## 2021-02-08 DIAGNOSIS — I083 Combined rheumatic disorders of mitral, aortic and tricuspid valves: Secondary | ICD-10-CM | POA: Diagnosis present

## 2021-02-08 DIAGNOSIS — I25810 Atherosclerosis of coronary artery bypass graft(s) without angina pectoris: Secondary | ICD-10-CM | POA: Diagnosis not present

## 2021-02-08 DIAGNOSIS — E8770 Fluid overload, unspecified: Secondary | ICD-10-CM | POA: Diagnosis not present

## 2021-02-08 DIAGNOSIS — E781 Pure hyperglyceridemia: Secondary | ICD-10-CM | POA: Diagnosis present

## 2021-02-08 DIAGNOSIS — E7849 Other hyperlipidemia: Secondary | ICD-10-CM | POA: Diagnosis not present

## 2021-02-08 DIAGNOSIS — I5032 Chronic diastolic (congestive) heart failure: Secondary | ICD-10-CM | POA: Diagnosis not present

## 2021-02-08 DIAGNOSIS — Z66 Do not resuscitate: Secondary | ICD-10-CM | POA: Diagnosis not present

## 2021-02-08 DIAGNOSIS — I255 Ischemic cardiomyopathy: Secondary | ICD-10-CM | POA: Diagnosis present

## 2021-02-08 DIAGNOSIS — U071 COVID-19: Secondary | ICD-10-CM | POA: Diagnosis not present

## 2021-02-08 DIAGNOSIS — E1122 Type 2 diabetes mellitus with diabetic chronic kidney disease: Secondary | ICD-10-CM | POA: Diagnosis not present

## 2021-02-08 DIAGNOSIS — I130 Hypertensive heart and chronic kidney disease with heart failure and stage 1 through stage 4 chronic kidney disease, or unspecified chronic kidney disease: Secondary | ICD-10-CM | POA: Diagnosis present

## 2021-02-08 DIAGNOSIS — N19 Unspecified kidney failure: Secondary | ICD-10-CM | POA: Diagnosis not present

## 2021-02-08 DIAGNOSIS — N179 Acute kidney failure, unspecified: Secondary | ICD-10-CM | POA: Diagnosis present

## 2021-02-08 DIAGNOSIS — A4189 Other specified sepsis: Secondary | ICD-10-CM | POA: Diagnosis present

## 2021-02-08 DIAGNOSIS — Z95820 Peripheral vascular angioplasty status with implants and grafts: Secondary | ICD-10-CM | POA: Diagnosis not present

## 2021-02-08 DIAGNOSIS — R001 Bradycardia, unspecified: Secondary | ICD-10-CM | POA: Diagnosis not present

## 2021-02-08 DIAGNOSIS — R0902 Hypoxemia: Secondary | ICD-10-CM | POA: Diagnosis not present

## 2021-02-08 DIAGNOSIS — E871 Hypo-osmolality and hyponatremia: Secondary | ICD-10-CM | POA: Diagnosis not present

## 2021-02-08 DIAGNOSIS — R0789 Other chest pain: Secondary | ICD-10-CM | POA: Diagnosis not present

## 2021-02-08 DIAGNOSIS — I6523 Occlusion and stenosis of bilateral carotid arteries: Secondary | ICD-10-CM

## 2021-02-08 DIAGNOSIS — I5033 Acute on chronic diastolic (congestive) heart failure: Secondary | ICD-10-CM | POA: Diagnosis present

## 2021-02-08 DIAGNOSIS — K5900 Constipation, unspecified: Secondary | ICD-10-CM | POA: Diagnosis not present

## 2021-02-08 DIAGNOSIS — E1165 Type 2 diabetes mellitus with hyperglycemia: Secondary | ICD-10-CM | POA: Diagnosis not present

## 2021-02-08 DIAGNOSIS — I440 Atrioventricular block, first degree: Secondary | ICD-10-CM | POA: Diagnosis present

## 2021-02-08 DIAGNOSIS — D631 Anemia in chronic kidney disease: Secondary | ICD-10-CM | POA: Diagnosis not present

## 2021-02-08 DIAGNOSIS — N184 Chronic kidney disease, stage 4 (severe): Secondary | ICD-10-CM | POA: Diagnosis not present

## 2021-02-08 DIAGNOSIS — J9601 Acute respiratory failure with hypoxia: Secondary | ICD-10-CM | POA: Diagnosis present

## 2021-02-08 DIAGNOSIS — I447 Left bundle-branch block, unspecified: Secondary | ICD-10-CM | POA: Diagnosis present

## 2021-02-08 DIAGNOSIS — I214 Non-ST elevation (NSTEMI) myocardial infarction: Secondary | ICD-10-CM | POA: Diagnosis not present

## 2021-02-08 DIAGNOSIS — Z4901 Encounter for fitting and adjustment of extracorporeal dialysis catheter: Secondary | ICD-10-CM | POA: Diagnosis not present

## 2021-02-08 DIAGNOSIS — I2510 Atherosclerotic heart disease of native coronary artery without angina pectoris: Secondary | ICD-10-CM | POA: Diagnosis not present

## 2021-02-08 DIAGNOSIS — I5020 Unspecified systolic (congestive) heart failure: Secondary | ICD-10-CM | POA: Diagnosis not present

## 2021-02-20 ENCOUNTER — Ambulatory Visit (INDEPENDENT_AMBULATORY_CARE_PROVIDER_SITE_OTHER): Payer: Medicare Other | Admitting: Medical

## 2021-02-20 ENCOUNTER — Encounter (HOSPITAL_COMMUNITY): Payer: Medicare Other

## 2021-02-20 ENCOUNTER — Ambulatory Visit: Payer: Medicare Other | Admitting: Vascular Surgery

## 2021-02-20 ENCOUNTER — Encounter: Payer: Self-pay | Admitting: Medical

## 2021-02-20 ENCOUNTER — Other Ambulatory Visit: Payer: Self-pay

## 2021-02-20 VITALS — BP 118/60 | HR 44 | Ht 68.0 in | Wt 178.0 lb

## 2021-02-20 DIAGNOSIS — N184 Chronic kidney disease, stage 4 (severe): Secondary | ICD-10-CM | POA: Diagnosis not present

## 2021-02-20 DIAGNOSIS — I5042 Chronic combined systolic (congestive) and diastolic (congestive) heart failure: Secondary | ICD-10-CM | POA: Diagnosis not present

## 2021-02-20 DIAGNOSIS — Z9861 Coronary angioplasty status: Secondary | ICD-10-CM | POA: Diagnosis not present

## 2021-02-20 DIAGNOSIS — Z951 Presence of aortocoronary bypass graft: Secondary | ICD-10-CM | POA: Diagnosis not present

## 2021-02-20 DIAGNOSIS — I2510 Atherosclerotic heart disease of native coronary artery without angina pectoris: Secondary | ICD-10-CM | POA: Diagnosis not present

## 2021-02-20 DIAGNOSIS — I739 Peripheral vascular disease, unspecified: Secondary | ICD-10-CM | POA: Diagnosis not present

## 2021-02-20 DIAGNOSIS — I6523 Occlusion and stenosis of bilateral carotid arteries: Secondary | ICD-10-CM

## 2021-02-20 DIAGNOSIS — I10 Essential (primary) hypertension: Secondary | ICD-10-CM

## 2021-02-20 DIAGNOSIS — E119 Type 2 diabetes mellitus without complications: Secondary | ICD-10-CM | POA: Diagnosis not present

## 2021-02-20 DIAGNOSIS — I25709 Atherosclerosis of coronary artery bypass graft(s), unspecified, with unspecified angina pectoris: Secondary | ICD-10-CM

## 2021-02-20 DIAGNOSIS — E785 Hyperlipidemia, unspecified: Secondary | ICD-10-CM

## 2021-02-20 DIAGNOSIS — R609 Edema, unspecified: Secondary | ICD-10-CM | POA: Diagnosis not present

## 2021-02-20 DIAGNOSIS — M79676 Pain in unspecified toe(s): Secondary | ICD-10-CM | POA: Diagnosis not present

## 2021-02-20 DIAGNOSIS — D649 Anemia, unspecified: Secondary | ICD-10-CM | POA: Diagnosis not present

## 2021-02-20 MED ORDER — ATORVASTATIN CALCIUM 80 MG PO TABS: 80.0000 mg | ORAL_TABLET | Freq: Every day | ORAL | 3 refills | Status: DC

## 2021-02-20 MED ORDER — NITROGLYCERIN 0.4 MG SL SUBL: 0.4000 mg | SUBLINGUAL_TABLET | SUBLINGUAL | 6 refills | Status: AC | PRN

## 2021-02-20 MED ORDER — ISOSORBIDE MONONITRATE ER 30 MG PO TB24: 30.0000 mg | ORAL_TABLET | Freq: Every day | ORAL | 3 refills | Status: AC

## 2021-02-20 MED ORDER — METOPROLOL SUCCINATE ER 25 MG PO TB24: 25.0000 mg | ORAL_TABLET | Freq: Every day | ORAL | 6 refills | Status: DC

## 2021-02-20 NOTE — Patient Instructions (Signed)
Medication Instructions:  STOP AMLODIPINE  STOP CARVEDILOL  STOP HYDRALAZINE  STOP ISOSORBIDE 60MG , CONTINUE THE 30MG   TAKE NITROGLYCERIN AS NEEDED  STOP FUROSEMIDE  WE WILL CALL YOU ABOUT YOU PLAVIX AND ASPIRIN  *If you need a refill on your cardiac medications before your next appointment, please call your pharmacy*  Special Instructions CONTINUE TO TAKE AND LOG YOUR BLOOD PRESSURE, PLEASE CALL IF YOUR BLOOD PRESSURE IS HIGHER THAN 130/80  PLEASE DISCUSS WITH NEPHROLOGIST ABOUT BUMEX -VS-LASIX -VS DIALYSIS-PLEASE HAVE THEM FAX TODAY'S NOTES TO OUR OFFICE [phone]  Follow-Up: Your next appointment:  2 month(s) In Person with Glenetta Hew, MD   At [facility], you and your health needs are our priority.  As part of our continuing mission to provide you with exceptional heart care, we have created designated Provider Care Teams.  These Care Teams include your primary Cardiologist (physician) and Advanced Practice Providers (APPs -  Physician Assistants and Nurse Practitioners) who all work together to provide you with the care you need, when you need it.  We recommend signing up for the patient portal called "MyChart".  Sign up information is provided on this After Visit Summary.  MyChart is used to connect with patients for Virtual Visits (Telemedicine).  Patients are able to view lab/test results, encounter notes, upcoming appointments, etc.  Non-urgent messages can be sent to your provider as well.   To learn more about what you can do with MyChart, go to [email or web address].

## 2021-02-20 NOTE — Progress Notes (Signed)
Cardiology Office Note   Date:  02/20/2021   ID:  Lance Collier, DOB 1945/07/24, MRN [id number]  PCP:  Reynold Bowen, MD  Cardiologist:  Glenetta Hew, MD EP: None  Chief Complaint  Patient presents with   Hospitalization Follow-up    CHF      History of Present Illness: Lance Collier is a 75 y.o. 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   He was last evaluated by our group during an admission to the hospital 8//22-8/10/22 after presenting to the hospital with SOB c/f acute on chronic diastolic CHF. An echocardiogram was updated which showed EF 45-50%, G2DD, normal RV size/function with mildly elevated PA pressures, mild LAE, moderate MR, and mild-moderate TR. He was diuresed with IV lasix 80mg  x2 doses with improvement in symptoms. In the past nephrology had recommended avoiding metolazone in the future. During that admission it was noted that patient was not interested in starting HD in the future.   It appears he was then admitted to [facility] in [place], [place] from 02/08/21-02/18/21 for acute on chronic CHF exacebation and COVID-19 infection. Troponins were elevated that admission and attributed to demand ischemia in the setting of CHF exacerbation. It appears his Cr worsened, peaking at 4.62 that admission and surprisingly HD was initiated. He had a tunneled catheter placed prior to discharge to facilitate HD upon return home. He was advised not to fly for 20 days following COVID-19 diagnosis with earliest fly home date recommended to be after 02/27/21.  Despite this he flew home 02/19/21.  He presents today with his wife. He states he has not taken any of his medications since 02/18/21 aside from his insulin this morning. There  were quite a few discrepancies between his recent hospital stay and returning. Home. BP was 118/60 today and he reports blood pressure was soft in the hospital. He has continues to have fatigue and coughing with his recent COVID-19 infection. He reports marked improvement in his breathing and LE edema following dialysis during recent admission. He has plans to see nephrology this afternoon to discuss ongoing need for HD and diuretic regimen. He has not had any chest pain complaints. No orthopnea or PND. He denies palpitations, dizziness, lightheadedness, or syncope.   Past Medical History:  Diagnosis Date   Anemia    Aortic sclerosis  - without Stenosis  March 2014   Echo: Aortic sclerosis without stenosis. EF 55-60% with normal WM; mild concentric hypertrophy with normal relaxation.   Arthritis    CAD (coronary artery disease), autologous vein bypass graft 09/2014   10/03/14: Cath for Class III-IV Angina -- 80% ostial-proximal SVG-RPDA (minimal flow down native PDA from native RCA with 50% proximal and distal, patent LIMA-LAD, SVG-OM with retrograde filling of OM 2. --> 10/12/14: Staged PCI of SVG-RPDA - Xience DES  3.0 mm x 38 mm; (3.4  - 3.3 mm)   CAD S/P percutaneous coronary angioplasty 06/12/2011; 09/2104   a) 2012: Complex PCI mRCA ([place]) --  2 overlapping Promus element DES stents.; 09/2014:  ost-prox SVG-rPDA - Xience DES 3.0 mm x 38 mm; (3.4  - 3.3 mm)   CHF (congestive heart failure) (HCC)    Childhood asthma    CKD (  chronic kidney disease), stage III ([place])    "mild; related to diabetes" (2/42/3536)   Complication of anesthesia    "I've had name recall recognition problems since my last anesthesia"   DM (diabetes mellitus) type II uncontrolled with eye manifestation ([place]) 2013   Diabetic retinopathy with retinal hemorrhages since;, recurrent in August 2014 treated with Avastin shots   Dyspnea    mainly with exertion   GERD (gastroesophageal reflux disease)    Heart murmur     questionable   High cholesterol    Hypertension    Migraine ~ 2004   "once"   PAD (peripheral artery disease) (HCC)    with claudication   PONV (postoperative nausea and vomiting)    s/p tonsillectomy   S/P CABG x 3 07/01/2013   a. 07/01/2013 (Cath for Crescendo Unstable Angina) --> [name]: For LM disease; LIMA-LAD, SVG-RCA, SCG-Cx; b. 09/2014 Cath/Staged PCI: patent LIMA->LAD, VG->OM1/RI, VG->RCA 80% (3.0x38 Xience Alpine DES on 10/12/2014).   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  Likely thromboembolic event with PDA occlusion. 06/12/2011   Post Complex PCI, pt states no mi   Wears glasses    reading    Past Surgical History:  Procedure Laterality Date   ABDOMINAL AORTOGRAM W/LOWER EXTREMITY N/A 02/05/2018   Procedure: ABDOMINAL AORTOGRAM W/LOWER EXTREMITY;  Surgeon: Elam Dutch, MD;  Location: [facility];  Service: Cardiovascular;  Laterality: N/A;   COLONOSCOPY     CORONARY ARTERY BYPASS GRAFT N/A 07/01/2013   Procedure: CORONARY ARTERY BYPASS GRAFTING (CABG);  Surgeon: Grace Isaac, MD;  Location: [facility];  Service: Open Heart Surgery;  Laterality: N/A;  Times 3 using left internal mammary artery and endoscopically harvested bilateral saphenous vein   ENDARTERECTOMY Left 05/21/2016   Procedure: LEFT CAROTID ENDARTERECTOMY;  Surgeon: Elam Dutch, MD;  Location: [facility];  Service: Vascular;  Laterality: Left;   ENDARTERECTOMY FEMORAL Left 02/23/2018   Procedure: LEFT COMMON FEMORAL ENDARTERECTOMY;  Surgeon: Elam Dutch, MD;  Location[facility] OR;  Service: Vascular;  Laterality: Left;   EYE SURGERY Bilateral    ioc for cataracts   INSERTION OF ILIAC STENT Bilateral 02/23/2018   Procedure: INSERTION OF BILATERAL COMMON ILIAC STENTS WITH VIABAHN STENTS;  Surgeon: Elam Dutch, MD;  Location: [facility];  Service: Vascular;  Laterality: Bilateral;   INTRAOPERATIVE TRANSESOPHAGEAL ECHOCARDIOGRAM N/A 07/01/2013    Procedure: INTRAOPERATIVE TRANSESOPHAGEAL ECHOCARDIOGRAM;  Surgeon: Grace Isaac, MD;  Location: [facility];  Service: Open Heart Surgery;  Laterality: N/A;   LEFT AND RIGHT HEART CATHETERIZATION WITH CORONARY/GRAFT ANGIOGRAM N/A 10/03/2014   Procedure: LEFT AND RIGHT HEART CATHETERIZATION WITH Beatrix Fetters;  Surgeon: Leonie Man, MD;  Location: [facility] CATH LAB;  Service: Cardiovascular; severe prox RCA 80% (dRCA w/ competetive flow & 60% hazy ISR in Native RCA), patent LIMA-LAD  & SVG-OM1-RI - retrograde fills OM2, known native LM & mod OM2.  Severe Systemic HTN - LVEDP/PCWP 22 & 28 mmHg, Normal CO/CI   LEFT HEART CATHETERIZATION WITH CORONARY ANGIOGRAM N/A 06/11/2011   Procedure: LEFT HEART CATHETERIZATION WITH CORONARY ANGIOGRAM;  Surgeon: Fulton Reek, MD;  Location: [facility] CATH LAB;;  Dense LM & prox LAD, prox RCA calcification. Large bifurcating OM1 & small OM2 normal. Minor LAD & D1 dz, tortuous prox-mid RCA w/ 60% &  subtotal occluded dRCA w/ normal PDA & PLV    LEFT HEART CATHETERIZATION WITH CORONARY ANGIOGRAM N/A 06/28/2013   Procedure: LEFT HEART  CATHETERIZATION WITH CORONARY ANGIOGRAM;  Surgeon: Leonie Man, MD;  Location: [facility] CATH LAB;  Service: Cardiovascular;  ostial LM disese, RCA ~40-50%ISR   NM MYOVIEW LTD  04/29/2018   LEXISCAN: Non-gated study with small fixed moderate intensity defect in the apical inferior wall suspicious for either small area of infarction versus attenuation (no change from previous study).  LOW RISK.    PATCH ANGIOPLASTY Left 05/21/2016   Procedure: PATCH ANGIOPLASTY USING HEMASHIELD PLATINUM FINESSE 0.3in x 3 in;  Surgeon: Elam Dutch, MD;  Location: [facility];  Service: Vascular;  Laterality: Left;   PATCH ANGIOPLASTY Left 02/23/2018   Procedure: PATCH ANGIOPLASTY LEFT COMMON FEMORAL ARTERY;  Surgeon: Elam Dutch, MD;  Location: [facility] OR;  Service: Vascular;  Laterality: Left;   PERCUTANEOUS CORONARY STENT INTERVENTION (PCI-S)  06/11/2011    Procedure: PERCUTANEOUS CORONARY STENT INTERVENTION (PCI-S);  Surgeon: Fulton Reek, MD;  Location: [facility] CATH LAB;  Service: Cardiovascular;;2 overlapping Promus DES 2.64mm at 12 mm x2; postdilated to 3 mm   PERCUTANEOUS CORONARY STENT INTERVENTION (PCI-S) N/A 10/12/2014   Procedure: PERCUTANEOUS CORONARY STENT INTERVENTION (PCI-S);  Surgeon: Leonie Man, MD;  Location: [facility] CATH LAB;  Ostial-Prox SVG-RCA Xience Alpine DES 3.0 mm x 38 mm; (3.4  - 3.3 mm)   RETINAL LASER PROCEDURE Bilateral    "more than once"    TONSILLECTOMY  as child   TRANSTHORACIC ECHOCARDIOGRAM  03/2017    EF 55-60%.  Normal thickness.  No R WMA..  GR 2 DD.  Mild-mod calcified aortic valve.  Mild RV systolic dysfunction.  Moderate RV dilation.  Moderately increased PA pressures.;;; 07/2019: EF 65%.  No WMA.  Unable to assess diastolic marginal RV PA pressures.  Mild to moderate LA dilation with moderate mild aortic valve sclerosis but no stenosis.     Current Outpatient Medications  Medication Sig Dispense Refill   nitroGLYCERIN (NITROSTAT) 0.4 MG SL tablet Place 1 tablet (0.4 mg total) under the tongue every 5 (five) minutes as needed for chest pain. 25 tablet 6   acetaminophen (TYLENOL) 325 MG tablet Take 650 mg by mouth daily as needed for mild pain or headache.      clopidogrel (PLAVIX) 75 MG tablet TAKE 1 TABLET DAILY WITH BREAKFAST. 90 tablet 2   Continuous Blood Gluc Sensor (FREESTYLE LIBRE 14 DAY SENSOR) MISC MONITOR BS CONTINUOUSLY  CHANGE Q 14 DAYS.     docusate sodium (COLACE) 100 MG capsule Take 100 mg by mouth daily as needed for mild constipation.      ezetimibe (ZETIA) 10 MG tablet Take 10 mg by mouth daily.     fenofibrate 160 MG tablet TAKE 1 TABLET ONCE DAILY. 90 tablet 2   HUMALOG KWIKPEN 200 UNIT/ML SOPN Inject 20-28 Units into the skin See admin instructions. Use 20 units (base) per meal increase units given by 1 unit for every 15 increment over 150 (I.E. Blood glucose reading 165=21 units, 180=22  units, etc. )  5   isosorbide mononitrate (IMDUR) 30 MG 24 hr tablet Take 1 tablet (30 mg total) by mouth daily. 30 tablet 3   pantoprazole (PROTONIX) 40 MG tablet TAKE 1 TABLET ONCE DAILY. 90 tablet 3   REPATHA SURECLICK 478 MG/ML SOAJ INJECT CONTENTS OF 1 PEN SUBCUTANEOUSLY EVERY 14 DAYS. (Patient taking differently: Inject 140 mg into the skin every 14 (fourteen) days.) 2 mL 11   rosuvastatin (CRESTOR) 10 MG tablet TAKE 1 TABLET ON TUESDAY, THURSDAY, SATURDAY AND SUNDAY. 51 tablet 3   TOUJEO  MAX SOLOSTAR 300 UNIT/ML SOPN Inject 60 Units into the skin 2 (two) times daily.  1   VASCEPA 1 G CAPS Take 2 g by mouth 2 (two) times daily.   4   No current facility-administered medications for this visit.    Allergies:   Atorvastatin, Ibuprofen, Pravastatin, and Simvastatin    Social History:  The patient  reports that he quit smoking about 24 years ago. His smoking use included cigarettes. He has a 54.00 pack-year smoking history. He has never used smokeless tobacco. He reports current alcohol use. He reports that he does not use drugs.   Family History:  The patient's family history includes Breast cancer in his mother and sister; Colon polyps in his father; Hyperlipidemia in his father; Hypertension in his father; Liver cancer in his maternal grandmother; Lung cancer in his sister; Throat cancer in his father.    ROS:  Please see the history of present illness.   Otherwise, review of systems are positive for none.   All other systems are reviewed and negative.    PHYSICAL EXAM: VS:  BP 118/60   Pulse (!) 44   Ht 5\' 8"  (1.727 m)   Wt 178 lb (80.7 kg)   SpO2 97%   BMI 27.06 kg/m  , BMI Body mass index is 27.06 kg/m. GEN: Well nourished, well developed, in no acute distress HEENT: sclera anicteric Neck: no JVD, carotid bruits, or masses Cardiac: RRR; no murmurs, rubs, or gallops, trace LE edema  Respiratory:  clear to auscultation bilaterally, normal work of breathing GI: soft,  protuberant, nontender, nondistended, + BS MS: no deformity or atrophy Skin: warm and dry, no rash Neuro:  Strength and sensation are intact Psych: euthymic mood, full affect   EKG:  EKG is not ordered today.    Recent Labs: 01/28/2021: B Natriuretic Peptide 1,266.4; Hemoglobin 9.1; Platelets 214 01/30/2021: BUN 47; Creatinine, Ser 2.93; Potassium 3.6; Sodium 136    Lipid Panel    Component Value Date/Time   CHOL 150 12/02/2018 0948   TRIG 341 (H) 12/02/2018 0948   HDL 23 (L) 12/02/2018 0948   CHOLHDL 6.5 (H) 12/02/2018 0948   CHOLHDL 8.8 09/20/2014 0838   VLDL 73 (H) 09/20/2014 0838   LDLCALC 59 12/02/2018 0948      Wt Readings from Last 3 Encounters:  02/20/21 178 lb (80.7 kg)  01/30/21 196 lb 6.4 oz (89.1 kg)  07/25/20 197 lb (89.4 kg)      Other studies Reviewed: Additional studies/ records that were reviewed today include:   Echocardiogram 01/29/21:  1. Left ventricular ejection fraction, by estimation, is 45 to 50%. The  left ventricle has mildly decreased function. The left ventricle  demonstrates global hypokinesis. Left ventricular diastolic parameters are  consistent with Grade II diastolic  dysfunction (pseudonormalization). The average left ventricular global  longitudinal strain is -13.9 %. The global longitudinal strain is  abnormal.   2. Right ventricular systolic function is normal. The right ventricular  size is normal. There is mildly elevated pulmonary artery systolic  pressure. The estimated right ventricular systolic pressure is 09.7 mmHg.   3. Left atrial size was mildly dilated.   4. The mitral valve is normal in structure. Moderate mitral valve  regurgitation. No evidence of mitral stenosis.   5. Tricuspid valve regurgitation is mild to moderate.   6. The aortic valve is normal in structure. Aortic valve regurgitation is  trivial. Mild aortic valve sclerosis is present, with no evidence of  aortic valve  stenosis.   7. The inferior vena cava  is normal in size with greater than 50%  respiratory variability, suggesting right atrial pressure of 3 mmHg.   Comparison(s): Prior images reviewed side by side. The left ventricular  function is worsened.   NST 04/2018: There was no ST segment deviation noted during stress. This is a low risk study.   1. Ungated study.  2. Fixed small, moderate intensity apical inferior perfusion defect, small area of infarction versus attenuation.  No definite ischemia. Overall low risk study.   ASSESSMENT AND PLAN:   1. Chronic combined CHF with recent mild LV dysfunction (EF45-50%): he has had 2 admissions this month for acute on chronic CHF - 01/28/21-01/30/21 at [facility], and more recently 8/19/2-8/29/22 in [place], [place] for COVID-19 and CHF at which time HD was initiated with improvement in SOB and LE edema. BP is soft today off all GDMT over the past 2 days.  - Will defer volume management to nephrology - bumex (prescribed at discharge from [place], [place]) vs lasix (previously prescribed) vs. HD - patient to have office note sent to our office for review - Continue to monitor daily weights - stable over the past couple days - Continue to limit salt intake - Will consider addition of GDMT if BP stabilizes   2. CAD s/p CABG in 2015 with subsequent PCI/DES to SVG-RCA in 2016: no anginal complaints. Was taken off plavix and started on aspirin at discharge from [place], [place] follow-up with Dr. Ellyn Hack to determine  - Continue aspirin and statin - Will hold carvedilol and amlodipine given soft blood pressures - Will continue imdur 30mg  daily  3. HTN: BP 118/60 today not on any medications x2 days - Will stop carvedilol, amlodipine, and hydralazine for now - Continue imdur as above  4. HLD: LDL 145 06/2020; goal <70; intolerant to high intensity statins - Continue low dose crestor QIW, +repatha, zetia, fenofibrate, and vascepa  5. PAD/carotid artery disease: s/p b/l iliac stenting in 2019 and L CEA in  2017.  - Continue cholesterol regimen as above  6. CKD stage 4: Cr peaked in the 4s during a recent admission in [place], [place]. Appears nephrology followed and he was started on HD for volume management with tunneled catheter placed prior to discharge. He has a visit with nephrology this afternoon to discuss next steps.   7. Recent COVID-19: diagnosed 02/08/21 during an admission in [place], [place]. Still with fatigue and cough but no SOB today - Continue ongoing supportive care   Current medicines are reviewed at length with the patient today.  The patient does not have concerns regarding medicines.  The following changes have been made:  As above  Labs/ tests ordered today include:  No orders of the defined types were placed in this encounter.    Disposition:   FU with Dr. Ellyn Hack in 2 months  Signed, Abigail Butts, PA-C  02/20/2021 6:35 PM

## 2021-02-22 DIAGNOSIS — I10 Essential (primary) hypertension: Secondary | ICD-10-CM | POA: Diagnosis not present

## 2021-02-22 DIAGNOSIS — N184 Chronic kidney disease, stage 4 (severe): Secondary | ICD-10-CM | POA: Diagnosis not present

## 2021-02-26 DIAGNOSIS — E785 Hyperlipidemia, unspecified: Secondary | ICD-10-CM | POA: Diagnosis not present

## 2021-02-26 DIAGNOSIS — I255 Ischemic cardiomyopathy: Secondary | ICD-10-CM | POA: Diagnosis not present

## 2021-02-26 DIAGNOSIS — I739 Peripheral vascular disease, unspecified: Secondary | ICD-10-CM | POA: Diagnosis not present

## 2021-02-26 DIAGNOSIS — I10 Essential (primary) hypertension: Secondary | ICD-10-CM | POA: Diagnosis not present

## 2021-02-26 DIAGNOSIS — Z794 Long term (current) use of insulin: Secondary | ICD-10-CM | POA: Diagnosis not present

## 2021-02-26 DIAGNOSIS — I6529 Occlusion and stenosis of unspecified carotid artery: Secondary | ICD-10-CM | POA: Diagnosis not present

## 2021-02-26 DIAGNOSIS — E1151 Type 2 diabetes mellitus with diabetic peripheral angiopathy without gangrene: Secondary | ICD-10-CM | POA: Diagnosis not present

## 2021-02-26 DIAGNOSIS — I2510 Atherosclerotic heart disease of native coronary artery without angina pectoris: Secondary | ICD-10-CM | POA: Diagnosis not present

## 2021-02-26 DIAGNOSIS — D631 Anemia in chronic kidney disease: Secondary | ICD-10-CM | POA: Diagnosis not present

## 2021-02-26 DIAGNOSIS — N184 Chronic kidney disease, stage 4 (severe): Secondary | ICD-10-CM | POA: Diagnosis not present

## 2021-02-26 DIAGNOSIS — I5042 Chronic combined systolic (congestive) and diastolic (congestive) heart failure: Secondary | ICD-10-CM | POA: Diagnosis not present

## 2021-02-26 DIAGNOSIS — I700 Atherosclerosis of aorta: Secondary | ICD-10-CM | POA: Diagnosis not present

## 2021-02-27 ENCOUNTER — Other Ambulatory Visit: Payer: Self-pay | Admitting: Cardiology

## 2021-02-28 ENCOUNTER — Telehealth: Payer: Self-pay

## 2021-02-28 NOTE — Telephone Encounter (Signed)
Per message from [place].  Kroeger, Lorelee Cover., PA-C  Jacqulynn Cadet, CMA [facility] - can you notify Mr. [name] that I spoke with Dr. Ellyn Hack and we are recommending he take plavix 75mg  daily and stop aspirin as recommended by the hospital in [place]. Please update his medications to reflect this change. Thanks!

## 2021-03-01 DIAGNOSIS — N184 Chronic kidney disease, stage 4 (severe): Secondary | ICD-10-CM | POA: Diagnosis not present

## 2021-03-04 DIAGNOSIS — I129 Hypertensive chronic kidney disease with stage 1 through stage 4 chronic kidney disease, or unspecified chronic kidney disease: Secondary | ICD-10-CM | POA: Diagnosis not present

## 2021-03-04 DIAGNOSIS — I2510 Atherosclerotic heart disease of native coronary artery without angina pectoris: Secondary | ICD-10-CM | POA: Diagnosis not present

## 2021-03-04 DIAGNOSIS — N184 Chronic kidney disease, stage 4 (severe): Secondary | ICD-10-CM | POA: Diagnosis not present

## 2021-03-04 DIAGNOSIS — I739 Peripheral vascular disease, unspecified: Secondary | ICD-10-CM | POA: Diagnosis not present

## 2021-03-04 DIAGNOSIS — E1122 Type 2 diabetes mellitus with diabetic chronic kidney disease: Secondary | ICD-10-CM | POA: Diagnosis not present

## 2021-03-04 DIAGNOSIS — R609 Edema, unspecified: Secondary | ICD-10-CM | POA: Diagnosis not present

## 2021-03-04 DIAGNOSIS — Z23 Encounter for immunization: Secondary | ICD-10-CM | POA: Diagnosis not present

## 2021-03-04 DIAGNOSIS — D649 Anemia, unspecified: Secondary | ICD-10-CM | POA: Diagnosis not present

## 2021-03-06 DIAGNOSIS — N186 End stage renal disease: Secondary | ICD-10-CM | POA: Diagnosis not present

## 2021-03-06 DIAGNOSIS — Z452 Encounter for adjustment and management of vascular access device: Secondary | ICD-10-CM | POA: Diagnosis not present

## 2021-03-06 DIAGNOSIS — Z992 Dependence on renal dialysis: Secondary | ICD-10-CM | POA: Diagnosis not present

## 2021-03-18 DIAGNOSIS — I2510 Atherosclerotic heart disease of native coronary artery without angina pectoris: Secondary | ICD-10-CM | POA: Diagnosis not present

## 2021-03-18 DIAGNOSIS — I739 Peripheral vascular disease, unspecified: Secondary | ICD-10-CM | POA: Diagnosis not present

## 2021-03-18 DIAGNOSIS — N184 Chronic kidney disease, stage 4 (severe): Secondary | ICD-10-CM | POA: Diagnosis not present

## 2021-03-18 DIAGNOSIS — I129 Hypertensive chronic kidney disease with stage 1 through stage 4 chronic kidney disease, or unspecified chronic kidney disease: Secondary | ICD-10-CM | POA: Diagnosis not present

## 2021-03-18 DIAGNOSIS — R609 Edema, unspecified: Secondary | ICD-10-CM | POA: Diagnosis not present

## 2021-03-18 DIAGNOSIS — E1122 Type 2 diabetes mellitus with diabetic chronic kidney disease: Secondary | ICD-10-CM | POA: Diagnosis not present

## 2021-03-18 DIAGNOSIS — Z23 Encounter for immunization: Secondary | ICD-10-CM | POA: Diagnosis not present

## 2021-03-18 DIAGNOSIS — D649 Anemia, unspecified: Secondary | ICD-10-CM | POA: Diagnosis not present

## 2021-03-19 DIAGNOSIS — R0602 Shortness of breath: Secondary | ICD-10-CM | POA: Diagnosis not present

## 2021-03-19 DIAGNOSIS — I213 ST elevation (STEMI) myocardial infarction of unspecified site: Secondary | ICD-10-CM | POA: Diagnosis not present

## 2021-03-19 DIAGNOSIS — R0902 Hypoxemia: Secondary | ICD-10-CM | POA: Diagnosis not present

## 2021-03-19 DIAGNOSIS — J80 Acute respiratory distress syndrome: Secondary | ICD-10-CM | POA: Diagnosis not present

## 2021-03-19 DIAGNOSIS — I499 Cardiac arrhythmia, unspecified: Secondary | ICD-10-CM | POA: Diagnosis not present

## 2021-03-20 ENCOUNTER — Other Ambulatory Visit: Payer: Self-pay

## 2021-03-20 ENCOUNTER — Emergency Department (HOSPITAL_COMMUNITY): Payer: Medicare Other

## 2021-03-20 ENCOUNTER — Encounter (HOSPITAL_COMMUNITY): Payer: Self-pay

## 2021-03-20 ENCOUNTER — Emergency Department (HOSPITAL_COMMUNITY)
Admission: EM | Admit: 2021-03-20 | Discharge: 2021-03-23 | Disposition: E | Payer: Medicare Other | Attending: Emergency Medicine | Admitting: Emergency Medicine

## 2021-03-20 DIAGNOSIS — I214 Non-ST elevation (NSTEMI) myocardial infarction: Secondary | ICD-10-CM

## 2021-03-20 DIAGNOSIS — D72829 Elevated white blood cell count, unspecified: Secondary | ICD-10-CM | POA: Diagnosis not present

## 2021-03-20 DIAGNOSIS — I5032 Chronic diastolic (congestive) heart failure: Secondary | ICD-10-CM | POA: Diagnosis not present

## 2021-03-20 DIAGNOSIS — R570 Cardiogenic shock: Secondary | ICD-10-CM | POA: Diagnosis not present

## 2021-03-20 DIAGNOSIS — I2510 Atherosclerotic heart disease of native coronary artery without angina pectoris: Secondary | ICD-10-CM | POA: Diagnosis not present

## 2021-03-20 DIAGNOSIS — E872 Acidosis, unspecified: Secondary | ICD-10-CM

## 2021-03-20 DIAGNOSIS — Z951 Presence of aortocoronary bypass graft: Secondary | ICD-10-CM | POA: Insufficient documentation

## 2021-03-20 DIAGNOSIS — Z794 Long term (current) use of insulin: Secondary | ICD-10-CM | POA: Diagnosis not present

## 2021-03-20 DIAGNOSIS — E1122 Type 2 diabetes mellitus with diabetic chronic kidney disease: Secondary | ICD-10-CM | POA: Insufficient documentation

## 2021-03-20 DIAGNOSIS — Z87891 Personal history of nicotine dependence: Secondary | ICD-10-CM | POA: Diagnosis not present

## 2021-03-20 DIAGNOSIS — R0602 Shortness of breath: Secondary | ICD-10-CM | POA: Diagnosis not present

## 2021-03-20 DIAGNOSIS — N184 Chronic kidney disease, stage 4 (severe): Secondary | ICD-10-CM | POA: Insufficient documentation

## 2021-03-20 DIAGNOSIS — Z20822 Contact with and (suspected) exposure to covid-19: Secondary | ICD-10-CM | POA: Insufficient documentation

## 2021-03-20 DIAGNOSIS — I509 Heart failure, unspecified: Secondary | ICD-10-CM | POA: Diagnosis not present

## 2021-03-20 DIAGNOSIS — Z7902 Long term (current) use of antithrombotics/antiplatelets: Secondary | ICD-10-CM | POA: Diagnosis not present

## 2021-03-20 DIAGNOSIS — I517 Cardiomegaly: Secondary | ICD-10-CM | POA: Diagnosis not present

## 2021-03-20 DIAGNOSIS — I130 Hypertensive heart and chronic kidney disease with heart failure and stage 1 through stage 4 chronic kidney disease, or unspecified chronic kidney disease: Secondary | ICD-10-CM | POA: Insufficient documentation

## 2021-03-20 DIAGNOSIS — J9601 Acute respiratory failure with hypoxia: Secondary | ICD-10-CM | POA: Diagnosis not present

## 2021-03-20 DIAGNOSIS — I110 Hypertensive heart disease with heart failure: Secondary | ICD-10-CM | POA: Diagnosis not present

## 2021-03-20 LAB — BASIC METABOLIC PANEL
Anion gap: 22 — ABNORMAL HIGH (ref 5–15)
BUN: 51 mg/dL — ABNORMAL HIGH (ref 8–23)
CO2: 14 mmol/L — ABNORMAL LOW (ref 22–32)
Calcium: 9.4 mg/dL (ref 8.9–10.3)
Chloride: 95 mmol/L — ABNORMAL LOW (ref 98–111)
Creatinine, Ser: 4.71 mg/dL — ABNORMAL HIGH (ref 0.61–1.24)
GFR, Estimated: 12 mL/min — ABNORMAL LOW (ref >60)
Glucose, Bld: 343 mg/dL — ABNORMAL HIGH (ref 70–99)
Potassium: 3.9 mmol/L (ref 3.5–5.1)
Sodium: 131 mmol/L — ABNORMAL LOW (ref 135–145)

## 2021-03-20 LAB — CBC WITH DIFFERENTIAL/PLATELET
Abs Immature Granulocytes: 0.15 10*3/uL — ABNORMAL HIGH (ref 0.00–0.07)
Basophils Absolute: 0.1 10*3/uL (ref 0.0–0.1)
Basophils Relative: 1 %
Eosinophils Absolute: 0.1 10*3/uL (ref 0.0–0.5)
Eosinophils Relative: 1 %
HCT: 37.5 % — ABNORMAL LOW (ref 39.0–52.0)
Hemoglobin: 11.2 g/dL — ABNORMAL LOW (ref 13.0–17.0)
Immature Granulocytes: 1 %
Lymphocytes Relative: 20 %
Lymphs Abs: 2.5 10*3/uL (ref 0.7–4.0)
MCH: 30 pg (ref 26.0–34.0)
MCHC: 29.9 g/dL — ABNORMAL LOW (ref 30.0–36.0)
MCV: 100.5 fL — ABNORMAL HIGH (ref 80.0–100.0)
Monocytes Absolute: 1.6 10*3/uL — ABNORMAL HIGH (ref 0.1–1.0)
Monocytes Relative: 13 %
Neutro Abs: 8 10*3/uL — ABNORMAL HIGH (ref 1.7–7.7)
Neutrophils Relative %: 64 %
Platelets: 399 10*3/uL (ref 150–400)
RBC: 3.73 MIL/uL — ABNORMAL LOW (ref 4.22–5.81)
RDW: 14 % (ref 11.5–15.5)
WBC: 12.5 10*3/uL — ABNORMAL HIGH (ref 4.0–10.5)
nRBC: 0 % (ref 0.0–0.2)

## 2021-03-20 LAB — PROTIME-INR
INR: 1.1 (ref 0.8–1.2)
Prothrombin Time: 13.8 seconds (ref 11.4–15.2)

## 2021-03-20 LAB — I-STAT VENOUS BLOOD GAS, ED
Acid-base deficit: 11 mmol/L — ABNORMAL HIGH (ref 0.0–2.0)
Bicarbonate: 16.6 mmol/L — ABNORMAL LOW (ref 20.0–28.0)
Calcium, Ion: 1.08 mmol/L — ABNORMAL LOW (ref 1.15–1.40)
HCT: 37 % — ABNORMAL LOW (ref 39.0–52.0)
Hemoglobin: 12.6 g/dL — ABNORMAL LOW (ref 13.0–17.0)
O2 Saturation: 41 %
Potassium: 3.9 mmol/L (ref 3.5–5.1)
Sodium: 132 mmol/L — ABNORMAL LOW (ref 135–145)
TCO2: 18 mmol/L — ABNORMAL LOW (ref 22–32)
pCO2, Ven: 43.3 mmHg — ABNORMAL LOW (ref 44.0–60.0)
pH, Ven: 7.191 — CL (ref 7.250–7.430)
pO2, Ven: 29 mmHg — CL (ref 32.0–45.0)

## 2021-03-20 LAB — TROPONIN I (HIGH SENSITIVITY): Troponin I (High Sensitivity): 16015 ng/L (ref <18)

## 2021-03-20 LAB — RESP PANEL BY RT-PCR (FLU A&B, COVID) ARPGX2
Influenza A by PCR: NEGATIVE
Influenza B by PCR: NEGATIVE
SARS Coronavirus 2 by RT PCR: NEGATIVE

## 2021-03-20 LAB — CBG MONITORING, ED: Glucose-Capillary: 347 mg/dL — ABNORMAL HIGH (ref 70–99)

## 2021-03-20 LAB — MAGNESIUM: Magnesium: 2.5 mg/dL — ABNORMAL HIGH (ref 1.7–2.4)

## 2021-03-20 LAB — LACTIC ACID, PLASMA: Lactic Acid, Venous: >9 mmol/L (ref 0.5–1.9)

## 2021-03-20 MED ORDER — FUROSEMIDE 10 MG/ML IJ SOLN
80.0000 mg | Freq: Once | INTRAMUSCULAR | Status: AC
  Administered 2021-03-20: 80 mg via INTRAVENOUS
  Filled 2021-03-20: qty 8

## 2021-03-20 MED ORDER — ONDANSETRON HCL 4 MG/2ML IJ SOLN
4.0000 mg | Freq: Once | INTRAMUSCULAR | Status: AC
  Administered 2021-03-20: 4 mg via INTRAVENOUS
  Filled 2021-03-20: qty 2

## 2021-03-20 MED ORDER — SODIUM CHLORIDE 0.9 % IV SOLN
500.0000 mg | Freq: Once | INTRAVENOUS | Status: AC
  Administered 2021-03-20: 500 mg via INTRAVENOUS
  Filled 2021-03-20: qty 500

## 2021-03-20 MED ORDER — ATROPINE SULFATE 1 MG/ML IJ SOLN
1.0000 mg | Freq: Once | INTRAMUSCULAR | Status: AC
  Administered 2021-03-20: 1 mg via INTRAVENOUS

## 2021-03-20 MED ORDER — SODIUM CHLORIDE 0.9 % IV SOLN
1.0000 g | Freq: Once | INTRAVENOUS | Status: AC
  Administered 2021-03-20: 1 g via INTRAVENOUS
  Filled 2021-03-20: qty 10

## 2021-03-23 NOTE — ED Notes (Signed)
CBG- [id number]

## 2021-03-23 NOTE — Progress Notes (Signed)
Interventional Cardiology Note:  STEMI activation today.  EKG demonstrates AF RVR with LVH and ST depressions.  Patient with dyspnea without chest pain.  Will cancel activation, further evaluation in ER.

## 2021-03-23 NOTE — ED Notes (Signed)
Lab results given to Nurse Marta Antu.

## 2021-03-23 NOTE — Progress Notes (Signed)
Responded to page to support family at bedside.  Pt. Passed. Provided emotional and spiritual support.    Jaclynn Major, [place], [facility], Pager [phone]

## 2021-03-23 NOTE — ED Provider Notes (Addendum)
[facility] EMERGENCY DEPARTMENT Provider Note   CSN: [id number] Arrival date & time: 2021-03-30  0751     History No chief complaint on file.   Lance Collier is a 75 y.o. male.  Level 5 history caveat limited due to acuity.  Initially STEMI alert, by EMS.  History obtained from patient, wife, EMS at bedside.  Patient started having difficulty breathing yesterday, was worse with lying flat, had to sit up in chair.  Got progressively worse early this morning and initially told his wife he needed to go to the hospital but then as his breathing got worse requested she call ambulance.  Denies new cough, no fever or chills.  No chest pain.  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 HPI     Past Medical History:  Diagnosis Date   Anemia    Aortic sclerosis  - without Stenosis 08/2012   Echo: Aortic sclerosis without stenosis. EF 55-60% with normal WM; mild concentric hypertrophy with normal relaxation.   Arthritis    CAD (coronary artery disease), autologous vein bypass graft 09/2014   10/03/14: Cath for Class III-IV Angina -- 80% ostial-proximal SVG-RPDA (minimal flow down native PDA from native RCA with 50% proximal and distal, patent LIMA-LAD, SVG-OM with retrograde filling of OM 2. --> 10/12/14: Staged PCI of SVG-RPDA - Xience DES  3.0 mm x 38 mm; (3.4  - 3.3 mm)   CAD S/P percutaneous coronary angioplasty 06/12/2011; 09/2104   a) 2012: Complex PCI mRCA ([place]) --  2 overlapping Promus element DES stents.; 09/2014:  ost-prox SVG-rPDA - Xience DES  3.0 mm x 38 mm; (3.4  - 3.3 mm)   CHF (congestive heart failure) (HCC)    CKD (chronic kidney disease), stage III ([place])    "mild; related to diabetes" (1/94/1740)   Complication of anesthesia    "I've had name recall recognition problems since my last anesthesia"   DM (diabetes mellitus) type II uncontrolled with eye manifestation ([place]) 2013   Diabetic retinopathy with retinal hemorrhages since;, recurrent in August 2014 treated with Avastin shots   GERD (gastroesophageal reflux disease)    High cholesterol    Hypertension    Migraine ~ 2004   "once"   PAD (peripheral artery disease) (HCC)    with claudication   PONV (postoperative nausea and vomiting)    s/p tonsillectomy   S/P CABG x 3 07/01/2013   a. 07/01/2013 (Cath for Crescendo Unstable Angina) --> [name]: For LM disease; LIMA-LAD, SVG-RCA, SCG-Cx; b. 09/2014 Cath/Staged PCI: patent LIMA->LAD, VG->OM1/RI, VG->RCA 80% (3.0x38 Xience Alpine DES on 10/12/2014).   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  Likely thromboembolic event with PDA occlusion. 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   Dizziness of unknown cause 03/06/2016   Atherosclerotic peripheral vascular disease with intermittent claudication ([place]) 07/05/2015   Bilateral carotid artery occlusion 07/05/2015   Chronic kidney disease (CKD), stage IV (severe) ([place]) 10/13/2014   CAD S/P PCI SVG-RCA Xience Alpine DES 3.0 mm x 38 mm  (ostial-prox) - 3.26mm 10/12/2014    Class: Status post   Chronic diastolic heart failure ([place]) 09/21/2014   Encounter for long-term (current) use of medications 08/01/2014   Asymptomatic stenosis of left carotid artery without infarction 12/22/2013   Episodic memory loss 11/05/2013   Edema of both legs 08/06/2013   PAD (peripheral artery disease) both Rt and Lt disease with Lt ICA of 60-79% 07/20/2013   S/P CABG (coronary artery bypass graft)  x 3, 07/01/13, LIMA-LAD; VG-RCA; VG- LCX 07/01/2013   Anemia    Left main coronary artery disease; with RCA in-stent restenosis 06/28/2013   Obesity (BMI 30-39.9) 03/19/2013   DOE (dyspnea on exertion) 06/12/2011   CAD (coronary artery disease) 06/12/2011   Diabetes mellitus ([place]) 06/12/2011   Essential hypertension 06/12/2011   Hyperlipidemia with target LDL less than 70; intolerance to atorvastatin and simvastatin 06/12/2011    Past Surgical History:  Procedure Laterality Date   ABDOMINAL AORTOGRAM W/LOWER EXTREMITY N/A 02/05/2018   Procedure: ABDOMINAL AORTOGRAM W/LOWER EXTREMITY;  Surgeon: Elam Dutch, MD;  Location: [place] CV LAB;  Service: Cardiovascular;  Laterality: N/A;   COLONOSCOPY     CORONARY ARTERY BYPASS GRAFT N/A 07/01/2013   Procedure: CORONARY ARTERY BYPASS GRAFTING (CABG);  Surgeon: Grace Isaac, MD;  Location: [place];  Service: Open Heart Surgery;  Laterality: N/A;  Times 3 using left internal mammary artery and endoscopically harvested bilateral saphenous vein   ENDARTERECTOMY Left 05/21/2016   Procedure: LEFT CAROTID ENDARTERECTOMY;  Surgeon: Elam Dutch, MD;  Location: [place];  Service: Vascular;  Laterality: Left;   ENDARTERECTOMY FEMORAL Left 02/23/2018   Procedure: LEFT COMMON FEMORAL ENDARTERECTOMY;  Surgeon: Elam Dutch, MD;  Location: [facility] OR;  Service: Vascular;  Laterality: Left;   EYE SURGERY Bilateral    ioc for cataracts   INSERTION OF ILIAC STENT Bilateral 02/23/2018   Procedure: INSERTION OF  BILATERAL COMMON ILIAC STENTS WITH VIABAHN STENTS;  Surgeon: Elam Dutch, MD;  Location: [place];  Service: Vascular;  Laterality: Bilateral;   INTRAOPERATIVE TRANSESOPHAGEAL ECHOCARDIOGRAM N/A 07/01/2013   Procedure: INTRAOPERATIVE TRANSESOPHAGEAL ECHOCARDIOGRAM;  Surgeon: Grace Isaac, MD;  Location: [place];  Service: Open Heart Surgery;  Laterality: N/A;   LEFT AND RIGHT HEART CATHETERIZATION WITH CORONARY/GRAFT ANGIOGRAM N/A 10/03/2014   Procedure: LEFT AND RIGHT HEART CATHETERIZATION WITH Beatrix Fetters;  Surgeon: Leonie Man, MD;  Location: [facility] CATH LAB;  Service: Cardiovascular; severe prox RCA 80% (dRCA w/ competetive flow & 60% hazy ISR in Native RCA), patent LIMA-LAD  & SVG-OM1-RI - retrograde fills OM2, known native LM & mod OM2.  Severe Systemic HTN - LVEDP/PCWP 22 & 28 mmHg, Normal CO/CI   LEFT HEART CATHETERIZATION WITH CORONARY ANGIOGRAM N/A 06/11/2011   Procedure: LEFT HEART CATHETERIZATION WITH CORONARY ANGIOGRAM;  Surgeon: Fulton Reek, MD;  Location: [place] CATH LAB;;  Dense LM & prox LAD, prox RCA calcification. Large bifurcating OM1 & small OM2 normal. Minor LAD & D1 dz, tortuous prox-mid RCA w/ 60% &  subtotal occluded dRCA w/ normal PDA & PLV    LEFT HEART CATHETERIZATION WITH CORONARY ANGIOGRAM N/A 06/28/2013   Procedure: LEFT HEART CATHETERIZATION WITH CORONARY ANGIOGRAM;  Surgeon: Leonie Man, MD;  Location: [facility] CATH LAB;  Service: Cardiovascular;  ostial LM disese, RCA ~40-50%ISR   NM MYOVIEW LTD  04/29/2018   LEXISCAN: Non-gated study with small fixed moderate intensity defect in the apical inferior wall suspicious for either small area of infarction versus attenuation (no change from previous study).  LOW RISK.    PATCH ANGIOPLASTY Left 05/21/2016   Procedure: PATCH ANGIOPLASTY USING HEMASHIELD PLATINUM FINESSE 0.3in x 3 in;  Surgeon: Elam Dutch, MD;  Location: [place];  Service: Vascular;  Laterality: Left;   PATCH ANGIOPLASTY Left 02/23/2018    Procedure: PATCH ANGIOPLASTY LEFT COMMON FEMORAL ARTERY;  Surgeon: Elam Dutch, MD;  Location: [facility] OR;  Service: Vascular;  Laterality: Left;   PERCUTANEOUS CORONARY STENT INTERVENTION (PCI-S)  06/11/2011   Procedure: PERCUTANEOUS CORONARY STENT INTERVENTION (PCI-S);  Surgeon: Fulton Reek, MD;  Location: [facility] CATH LAB;  Service: Cardiovascular;;2 overlapping Promus DES 2.32mm at 12 mm x2; postdilated to 3 mm   PERCUTANEOUS CORONARY STENT INTERVENTION (PCI-S) N/A 10/12/2014   Procedure: PERCUTANEOUS CORONARY STENT INTERVENTION (PCI-S);  Surgeon: Leonie Man, MD;  Location: [facility] CATH LAB;  Ostial-Prox SVG-RCA Xience Alpine DES 3.0 mm x 38 mm; (3.4  - 3.3 mm)   RETINAL LASER PROCEDURE Bilateral    "more than once"    TONSILLECTOMY  as child   TRANSTHORACIC ECHOCARDIOGRAM  03/2017    EF 55-60%.  Normal thickness.  No R WMA..  GR 2 DD.  Mild-mod calcified aortic valve.  Mild RV systolic dysfunction.  Moderate RV dilation.  Moderately increased PA pressures.;;; 07/2019: EF 65%.  No WMA.  Unable to assess diastolic marginal RV PA pressures.  Mild to moderate LA dilation with moderate mild aortic valve sclerosis but no stenosis.       Family History  Problem Relation Age of Onset   Lung cancer Sister    Breast cancer Sister    Breast cancer Mother    Hyperlipidemia Father    Hypertension Father    Throat cancer Father    Colon polyps Father    Liver cancer Maternal Grandmother    Stomach cancer Neg Hx    Rectal cancer Neg Hx    Colon cancer Neg Hx    Esophageal cancer Neg Hx     Social History   Tobacco Use   Smoking status: Former    Packs/day: 2.00    Years: 27.00    Pack years: 54.00    Types: Cigarettes    Quit date: 11/23/1996    Years since quitting: 24.3   Smokeless tobacco: Never  Vaping Use   Vaping Use: Never used  Substance Use Topics   Alcohol use: Yes    Comment: rarely   Drug use: No    Home Medications Prior to Admission medications   Medication Sig  Start Date End Date Taking? Authorizing Provider  acetaminophen (TYLENOL) 325 MG tablet Take 650 mg by mouth daily as needed for mild pain or headache.     [provider]  clopidogrel (PLAVIX) 75 MG tablet TAKE 1 TABLET DAILY WITH BREAKFAST. 02/27/21   Leonie Man, MD  Continuous Blood Gluc Sensor (FREESTYLE LIBRE 14 DAY SENSOR) MISC MONITOR BS CONTINUOUSLY  CHANGE Q 14 DAYS. 11/28/18   [provider]  docusate sodium (COLACE) 100 MG  capsule Take 100 mg by mouth daily as needed for mild constipation.     [provider]  ezetimibe (ZETIA) 10 MG tablet Take 10 mg by mouth daily.    [provider]  fenofibrate 160 MG tablet TAKE 1 TABLET ONCE DAILY. 12/10/20   Leonie Man, MD  HUMALOG KWIKPEN 200 UNIT/ML SOPN Inject 20-28 Units into the skin See admin instructions. Use 20 units (base) per meal increase units given by 1 unit for every 15 increment over 150 (I.E. Blood glucose reading 165=21 units, 180=22 units, etc. ) 11/07/17   [provider]  isosorbide mononitrate (IMDUR) 30 MG 24 hr tablet Take 1 tablet (30 mg total) by mouth daily. 02/20/21   Kroeger, Lorelee Cover., PA-C  nitroGLYCERIN (NITROSTAT) 0.4 MG SL tablet Place 1 tablet (0.4 mg total) under the tongue every 5 (five) minutes as needed for chest pain. 02/20/21 05/21/21  Kroeger, Lorelee Cover., PA-C  pantoprazole (PROTONIX) 40 MG tablet TAKE 1 TABLET ONCE DAILY. 09/19/20   Leonie Man, MD  REPATHA SURECLICK 099 MG/ML SOAJ INJECT CONTENTS OF 1 PEN SUBCUTANEOUSLY EVERY 14 DAYS. Patient taking differently: Inject 140 mg into the skin every 14 (fourteen) days. 07/03/20   Leonie Man, MD  rosuvastatin (CRESTOR) 10 MG tablet TAKE 1 TABLET ON TUESDAY, THURSDAY, SATURDAY AND SUNDAY. 09/19/20   Leonie Man, MD  TOUJEO MAX SOLOSTAR 300 UNIT/ML SOPN Inject 60 Units into the skin 2 (two) times daily. 11/07/17   [provider]  VASCEPA 1 G CAPS Take 2 g by mouth 2 (two) times daily.  01/31/15    [provider]    Allergies    Atorvastatin, Ibuprofen, Pravastatin, and Simvastatin  Review of Systems   Review of Systems  Unable to perform ROS: Acuity of condition   Physical Exam Updated Vital Signs BP (!) 67/50   Pulse 65   Temp (!) 97.4 F (36.3 C) (Axillary)   Resp (!) 24   Ht 5\' 8"  (1.727 m)   Wt 80 kg   SpO2 (!) 71%   BMI 26.82 kg/m   Physical Exam Vitals and nursing note reviewed.  Constitutional:      Appearance: He is well-developed.     Comments: Alert, speaks in short phrases, respiratory distress  HENT:     Head: Normocephalic and atraumatic.  Eyes:     Conjunctiva/sclera: Conjunctivae normal.  Cardiovascular:     Rate and Rhythm: Normal rate. Rhythm irregular.     Heart sounds: No murmur heard. Pulmonary:     Comments: Patient is in respiratory distress, accessory muscle use, bilateral crackles, poor air movement Abdominal:     Palpations: Abdomen is soft.     Tenderness: There is no abdominal tenderness.  Musculoskeletal:     Cervical back: Neck supple.  Skin:    General: Skin is warm.     Comments: Somewhat diaphoretic  Neurological:     Comments: Lethargic, somewhat confused but able to answer basic questions    ED Results / Procedures / Treatments   Labs (all labs ordered are listed, but only abnormal results are displayed) Labs Reviewed  CBC WITH DIFFERENTIAL/PLATELET - Abnormal; Notable for the following components:      Result Value   WBC 12.5 (*)    RBC 3.73 (*)    Hemoglobin 11.2 (*)    HCT 37.5 (*)    MCV 100.5 (*)    MCHC 29.9 (*)    Neutro Abs 8.0 (*)  Monocytes Absolute 1.6 (*)    Abs Immature Granulocytes 0.15 (*)    All other components within normal limits  BASIC METABOLIC PANEL - Abnormal; Notable for the following components:   Sodium 131 (*)    Chloride 95 (*)    CO2 14 (*)    Glucose, Bld 343 (*)    BUN 51 (*)    Creatinine, Ser 4.71 (*)    GFR, Estimated 12 (*)    Anion gap 22 (*)    All other  components within normal limits  MAGNESIUM - Abnormal; Notable for the following components:   Magnesium 2.5 (*)    All other components within normal limits  LACTIC ACID, PLASMA - Abnormal; Notable for the following components:   Lactic Acid, Venous >9.0 (*)    All other components within normal limits  CBG MONITORING, ED - Abnormal; Notable for the following components:   Glucose-Capillary 347 (*)    All other components within normal limits  TROPONIN I (HIGH SENSITIVITY) - Abnormal; Notable for the following components:   Troponin I (High Sensitivity) 16,015 (*)    All other components within normal limits  RESP PANEL BY RT-PCR (FLU A&B, COVID) ARPGX2  CULTURE, BLOOD (ROUTINE X 2)  CULTURE, BLOOD (ROUTINE X 2)  PROTIME-INR  LACTIC ACID, PLASMA  BRAIN NATRIURETIC PEPTIDE  I-STAT VENOUS BLOOD GAS, ED  TROPONIN I (HIGH SENSITIVITY)    EKG EKG Interpretation  Date/Time:  Apr 13, 2021 08:05:48 EDT Ventricular Rate:  104 PR Interval:    QRS Duration: 146 QT Interval:  371 QTC Calculation: 452 R Axis:   150 Text Interpretation: Atrial fibrillation Ventricular premature complex Consider left ventricular hypertrophy Repol abnrm, severe global ischemia (LM/MVD) does not meet acute STEMI criteria Confirmed by Madalyn Rob [phone]) on 13-Apr-2021 8:46:11 AM  Radiology DG Chest Portable 1 View  Result Date: 04/13/2021 CLINICAL DATA:  short of breath EXAM: PORTABLE CHEST 1 VIEW COMPARISON:  August 8 FINDINGS: The heart is enlarged. No significant pleural effusion. No pneumothorax. There are increased interstitial markings with right greater than left hazy opacities. Osteopenia, no acute osseous abnormality. Median sternotomy wires. IMPRESSION: Cardiomegaly with increased interstitial markings and right greater than left hazy opacities. Findings are favored to represent moderate to severe pulmonary edema. The asymmetry of the pulmonary opacities raises possibility of  atypical infection, although this is felt less likely. Electronically Signed   By: Albin Felling M.D.   On: April 13, 2021 08:29    Procedures .Critical Care Performed by: Lucrezia Starch, MD Authorized by: Lucrezia Starch, MD   Critical care provider statement:    Critical care time (minutes):  78   Critical care was time spent personally by me on the following activities:  Discussions with consultants, evaluation of patient's response to treatment, examination of patient, ordering and performing treatments and interventions, ordering and review of laboratory studies, ordering and review of radiographic studies, pulse oximetry, re-evaluation of patient's condition, obtaining history from patient or surrogate and review of old charts   Medications Ordered in ED Medications  ondansetron (ZOFRAN) injection 4 mg (4 mg Intravenous Given 2021/04/13 0851)  furosemide (LASIX) injection 80 mg (80 mg Intravenous Given 04-13-21 0851)  cefTRIAXone (ROCEPHIN) 1 g in sodium chloride 0.9 % 100 mL IVPB (0 g Intravenous Stopped 2021/04/13 0937)  azithromycin (ZITHROMAX) 500 mg in sodium chloride 0.9 % 250 mL IVPB (0 mg Intravenous Stopped 04-13-2021 0950)  atropine injection 1 mg (1 mg Intravenous Given 04-13-21 0905)  ED Course  I have reviewed the triage vital signs and the nursing notes.  Pertinent labs & imaging results that were available during my care of the patient were reviewed by me and considered in my medical decision making (see chart for details).    MDM Rules/Calculators/A&P                          75 year old male presented to ER with concern for shortness of breath.  Significant medical history of coronary artery disease, CKD, diabetes, heart failure, multiple recent hospital admissions.  On arrival to ER, patient appeared to be in respiratory distress, labored breathing.  Initial chest x-ray with severe pulmonary edema.  Suspected most likely heart failure.  Was initially STEMI alert, STEMI  doc evaluated patient and felt likely the ischemic changes on EKG were old and did not meet stemi criteria.  Initiated BiPAP, Lasix, initial BP was borderline, did not feel he would tolerate nitro drip from BP standpoint. Given asymetry in CXR also started abx to cover possible pneumonia. Had early code conversation with patient and with wife.  Both confirm DNR/DNI.  Work-up was notable for profound lactic acidosis, profound troponin elevation, leukocytosis, metabolic acidosis on vbg.  Patient's respiratory status progressively worsened.  Wife was okay with non-invasive measures but very clear would not want invasive procedures, any form of life support.   Daughter also came to bedside and confirmed code status/goals of care. Patient had progressive hypoxia, hypotension, bradycardia, worsening mental status and ultimately died at 81. Sent epic message to PCP re death certificate. Discussed with Izora Ribas, ME, he states not ME case.   Final Clinical Impression(s) / ED Diagnoses Final diagnoses:  NSTEMI (non-ST elevated myocardial infarction) ([place])  Heart failure, unspecified HF chronicity, unspecified heart failure type ([place])  Cardiogenic shock (HCC)  Acute respiratory failure with hypoxia (HCC)  Lactic acidosis  Metabolic acidosis    Rx / DC Orders ED Discharge Orders     None        Lucrezia Starch, MD 03/23/2021 1036    Lucrezia Starch, MD March 23, 2021 1037    Lucrezia Starch, MD 2021/03/23 1041

## 2021-03-23 NOTE — ED Triage Notes (Signed)
Pt bib [facility] from home where he lives with wife. Pt has complaints of increased shob for two days. EMS states upon arrival pt was tripoding and had st elevations. Code stemi called and cancelled once pt got to ED. Pt presents with non rebreather on, pale and diaphoretic.  EMS vitals: 56/38, 70% on room air, 90% on NRB

## 2021-03-23 NOTE — ED Notes (Signed)
Medical examiner transferred to MD per his request

## 2021-03-23 DEATH — deceased

## 2021-03-25 LAB — CULTURE, BLOOD (ROUTINE X 2): Culture: NO GROWTH

## 2021-04-29 ENCOUNTER — Ambulatory Visit: Payer: Medicare Other | Admitting: Cardiology

## 2021-06-25 ENCOUNTER — Encounter (INDEPENDENT_AMBULATORY_CARE_PROVIDER_SITE_OTHER): Payer: Medicare Other | Admitting: Ophthalmology

## 2022-01-28 IMAGING — CR DG CHEST 2V
2 series · 2 of 2 positions shown · non-contrast
Comparison: 08/04/2013

CLINICAL DATA: Dyspnea and peripheral edema. Acute on chronic
congestive heart failure.

EXAM:
CHEST - 2 VIEW

[w chest pa]
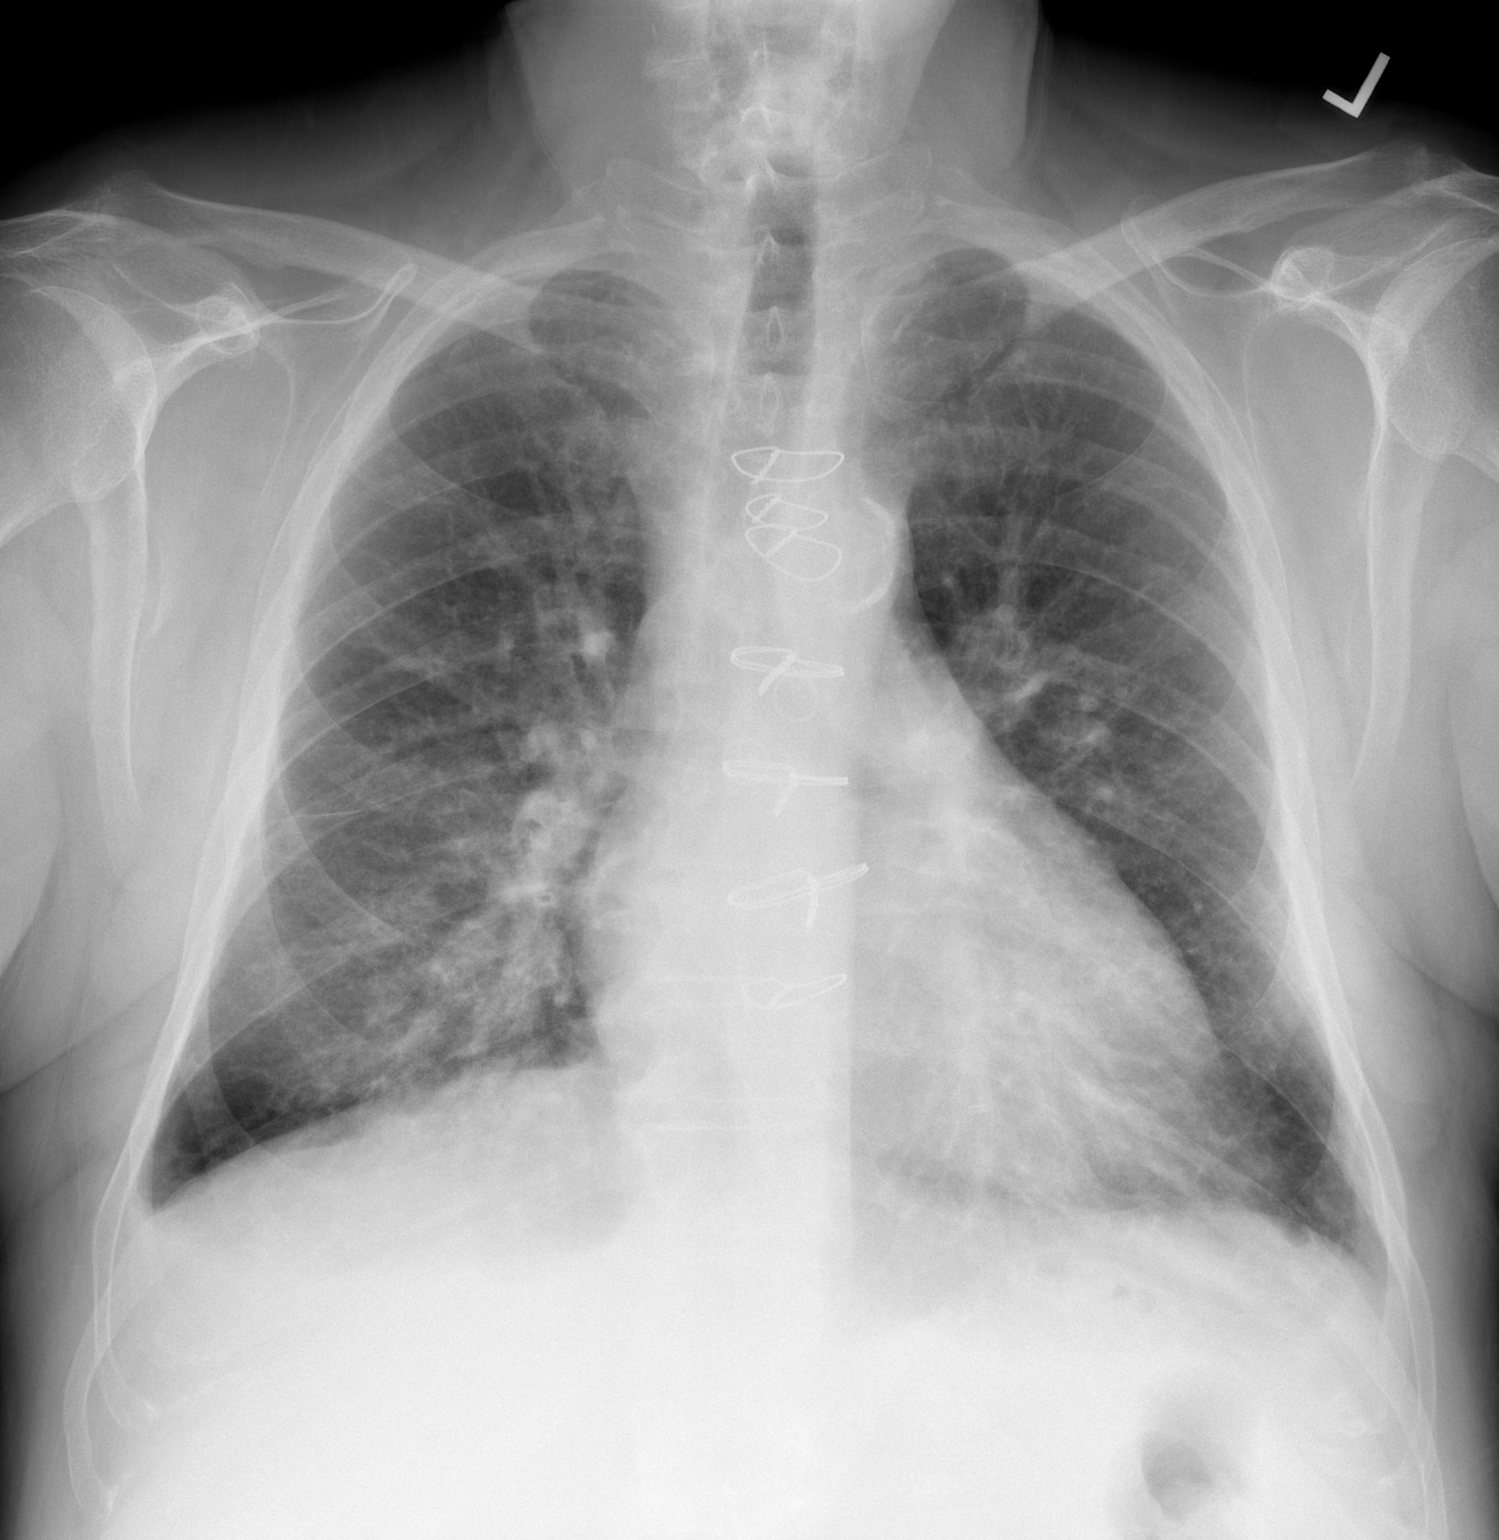

[w chest lat]
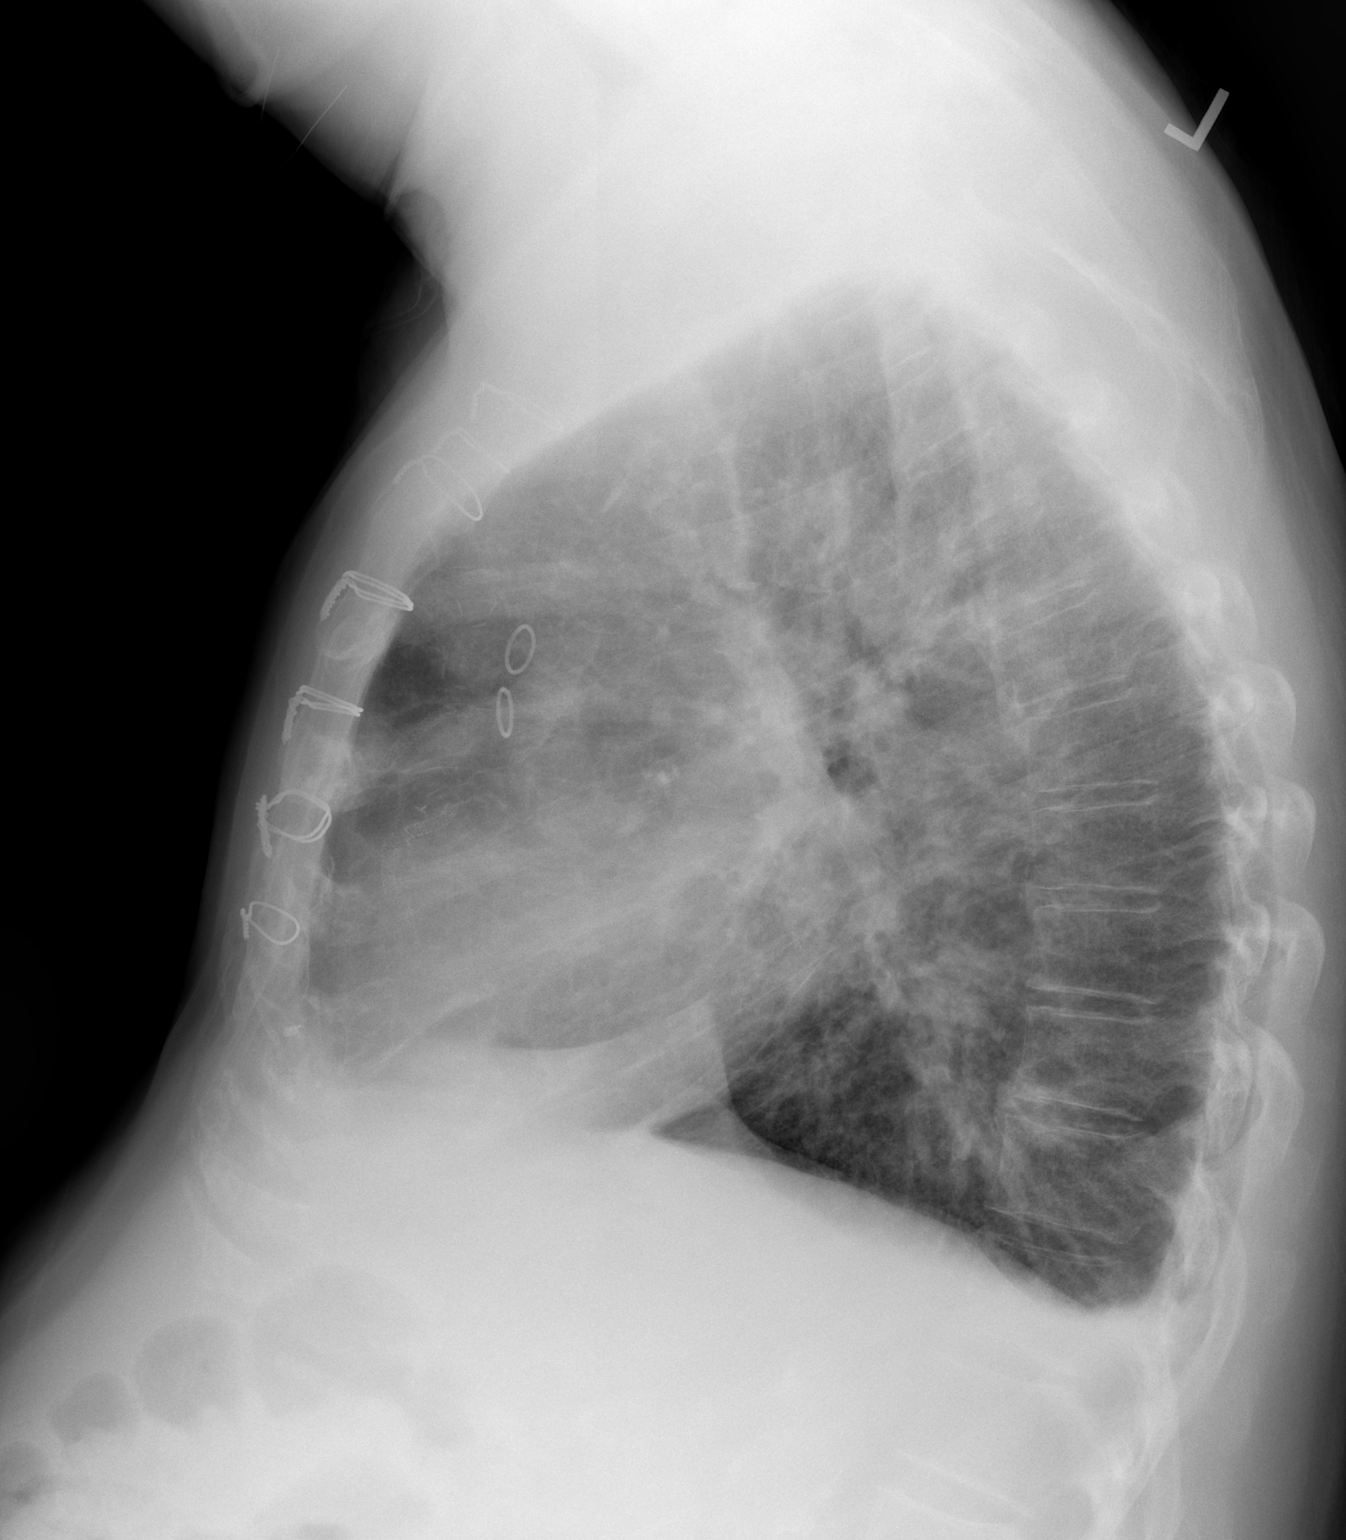

[2 of 2 positions shown; findings below may reference images not displayed]

FINDINGS: Stable mild cardiomegaly. Aortic atherosclerosis incidentally noted.
Prior CABG again noted.

New diffuse interstitial infiltrates are seen as well as a tiny
right pleural effusion. These findings are consistent with mild
congestive heart failure. No evidence of pulmonary consolidation.
IMPRESSION: Mild congestive heart failure.
